# Patient Record
Sex: Male | Born: 1985 | Race: White | Hispanic: No | Marital: Married
Health system: Southern US, Academic
[De-identification: ages and names within clinical notes are randomized; demographics above are authoritative.]

## PROBLEM LIST (undated history)

## (undated) ENCOUNTER — Ambulatory Visit

## (undated) ENCOUNTER — Telehealth

## (undated) ENCOUNTER — Ambulatory Visit: Payer: PRIVATE HEALTH INSURANCE

## (undated) ENCOUNTER — Encounter: Attending: Nephrology | Primary: Nephrology

## (undated) ENCOUNTER — Encounter: Attending: Geriatric Medicine | Primary: Geriatric Medicine

## (undated) ENCOUNTER — Encounter

## (undated) ENCOUNTER — Encounter: Attending: Surgery | Primary: Surgery

## (undated) ENCOUNTER — Non-Acute Institutional Stay: Payer: PRIVATE HEALTH INSURANCE

## (undated) ENCOUNTER — Ambulatory Visit: Payer: PRIVATE HEALTH INSURANCE | Attending: Geriatric Medicine | Primary: Geriatric Medicine

## (undated) ENCOUNTER — Telehealth: Attending: Clinical | Primary: Clinical

## (undated) ENCOUNTER — Encounter
Attending: Student in an Organized Health Care Education/Training Program | Primary: Student in an Organized Health Care Education/Training Program

## (undated) ENCOUNTER — Encounter: Payer: PRIVATE HEALTH INSURANCE | Attending: Nephrology | Primary: Nephrology

## (undated) ENCOUNTER — Institutional Professional Consult (permissible substitution): Payer: PRIVATE HEALTH INSURANCE

## (undated) ENCOUNTER — Encounter: Attending: Hematology | Primary: Hematology

## (undated) ENCOUNTER — Telehealth: Attending: Nephrology | Primary: Nephrology

## (undated) ENCOUNTER — Ambulatory Visit
Payer: PRIVATE HEALTH INSURANCE | Attending: Rehabilitative and Restorative Service Providers" | Primary: Rehabilitative and Restorative Service Providers"

## (undated) ENCOUNTER — Ambulatory Visit
Payer: BLUE CROSS/BLUE SHIELD | Attending: Student in an Organized Health Care Education/Training Program | Primary: Student in an Organized Health Care Education/Training Program

## (undated) ENCOUNTER — Encounter: Attending: Physician Assistant | Primary: Physician Assistant

## (undated) ENCOUNTER — Ambulatory Visit: Payer: BLUE CROSS/BLUE SHIELD

## (undated) ENCOUNTER — Encounter: Payer: PRIVATE HEALTH INSURANCE | Attending: Surgery | Primary: Surgery

## (undated) ENCOUNTER — Ambulatory Visit
Attending: Student in an Organized Health Care Education/Training Program | Primary: Student in an Organized Health Care Education/Training Program

## (undated) ENCOUNTER — Encounter: Attending: Anesthesiology | Primary: Anesthesiology

## (undated) ENCOUNTER — Ambulatory Visit: Payer: PRIVATE HEALTH INSURANCE | Attending: Nephrology | Primary: Nephrology

## (undated) ENCOUNTER — Ambulatory Visit: Attending: Nephrology | Primary: Nephrology

## (undated) ENCOUNTER — Telehealth
Attending: Student in an Organized Health Care Education/Training Program | Primary: Student in an Organized Health Care Education/Training Program

## (undated) DIAGNOSIS — K649 Unspecified hemorrhoids: Secondary | ICD-10-CM

## (undated) DIAGNOSIS — R195 Other fecal abnormalities: Secondary | ICD-10-CM

## (undated) DIAGNOSIS — K5900 Constipation, unspecified: Secondary | ICD-10-CM

## (undated) DIAGNOSIS — N059 Unspecified nephritic syndrome with unspecified morphologic changes: Secondary | ICD-10-CM

## (undated) DIAGNOSIS — G4730 Sleep apnea, unspecified: Secondary | ICD-10-CM

## (undated) DIAGNOSIS — N159 Renal tubulo-interstitial disease, unspecified: Secondary | ICD-10-CM

## (undated) DIAGNOSIS — R197 Diarrhea, unspecified: Secondary | ICD-10-CM

## (undated) DIAGNOSIS — R112 Nausea with vomiting, unspecified: Secondary | ICD-10-CM

## (undated) DIAGNOSIS — R0602 Shortness of breath: Secondary | ICD-10-CM

## (undated) HISTORY — DX: Sleep apnea, unspecified: G47.30

## (undated) HISTORY — DX: Unspecified hemorrhoids: K64.9

## (undated) HISTORY — DX: Nausea with vomiting, unspecified: R11.2

## (undated) HISTORY — DX: Constipation, unspecified: K59.00

## (undated) HISTORY — DX: Shortness of breath: R06.02

## (undated) HISTORY — DX: Unspecified nephritic syndrome with unspecified morphologic changes: N05.9

## (undated) HISTORY — DX: Diarrhea, unspecified: R19.7

## (undated) HISTORY — DX: Other fecal abnormalities: R19.5

## (undated) HISTORY — PX: APPENDECTOMY: SHX54

## (undated) HISTORY — DX: Renal tubulo-interstitial disease, unspecified: N15.9

## (undated) MED ORDER — BUSPIRONE 30 MG TABLET
Freq: Two times a day (BID) | ORAL | 0 days
Start: ? — End: 2020-09-23

## (undated) MED ORDER — BUSPIRONE 15 MG TABLET: Freq: Two times a day (BID) | ORAL | 0.00000 days

---

## 2006-08-19 ENCOUNTER — Emergency Department: Payer: Self-pay | Admitting: Emergency Medicine

## 2012-01-17 ENCOUNTER — Inpatient Hospital Stay: Payer: Self-pay | Admitting: Surgery

## 2012-01-17 LAB — COMPREHENSIVE METABOLIC PANEL
Albumin: 4.7 g/dL (ref 3.4–5.0)
Anion Gap: 7 (ref 7–16)
Bilirubin,Total: 0.8 mg/dL (ref 0.2–1.0)
Calcium, Total: 9.6 mg/dL (ref 8.5–10.1)
Chloride: 100 mmol/L (ref 98–107)
Glucose: 98 mg/dL (ref 65–99)
Osmolality: 266 (ref 275–301)
Total Protein: 9.3 g/dL — ABNORMAL HIGH (ref 6.4–8.2)

## 2012-01-17 LAB — CBC
HCT: 46.7 % (ref 40.0–52.0)
MCH: 33.3 pg (ref 26.0–34.0)
MCV: 93 fL (ref 80–100)
RBC: 5.01 10*6/uL (ref 4.40–5.90)
RDW: 12.5 % (ref 11.5–14.5)
WBC: 15.5 10*3/uL — ABNORMAL HIGH (ref 3.8–10.6)

## 2012-01-17 LAB — URINALYSIS, COMPLETE
Bacteria: NONE SEEN
Blood: NEGATIVE
Glucose,UR: NEGATIVE mg/dL (ref 0–75)
Leukocyte Esterase: NEGATIVE
Nitrite: NEGATIVE
Ph: 5 (ref 4.5–8.0)
Protein: NEGATIVE
Specific Gravity: 1.011 (ref 1.003–1.030)
WBC UR: <1 /HPF (ref 0–5)

## 2012-01-17 LAB — LIPASE, BLOOD: Lipase: 123 U/L (ref 73–393)

## 2012-01-18 LAB — PATHOLOGY REPORT

## 2012-01-19 LAB — CBC WITH DIFFERENTIAL/PLATELET
Basophil #: 0 10*3/uL (ref 0.0–0.1)
Eosinophil #: 0.1 10*3/uL (ref 0.0–0.7)
Eosinophil %: 1.7 %
HGB: 12.1 g/dL — ABNORMAL LOW (ref 13.0–18.0)
Lymphocyte #: 2.1 10*3/uL (ref 1.0–3.6)
MCH: 32.4 pg (ref 26.0–34.0)
MCHC: 34.2 g/dL (ref 32.0–36.0)
MCV: 95 fL (ref 80–100)
Monocyte #: 0.9 x10 3/mm (ref 0.2–1.0)
Neutrophil %: 61.6 %
Platelet: 152 10*3/uL (ref 150–440)
RDW: 12.4 % (ref 11.5–14.5)
WBC: 8.2 10*3/uL (ref 3.8–10.6)

## 2014-09-21 NOTE — Op Note (Signed)
PATIENT NAME:  Jeffrey Holloway, Jeffrey Holloway MR#:  [id number] DATE OF BIRTH:  [birth year]  DATE OF PROCEDURE:  01/17/2012  PREOPERATIVE DIAGNOSIS: Acute appendicitis.   POSTOPERATIVE DIAGNOSIS: Acute appendicitis with perforation.   PROCEDURE: Laparoscopic appendectomy.   SURGEON: Meilah Delrosario A. Marina Gravel, M.D.   ASSISTANT: None.   ANESTHESIA: General endotracheal.   DESCRIPTION OF PROCEDURE: With the patient in the supine position, general endotracheal anesthesia was induced. His abdomen was clipped of hair, prepped and draped with ChloraPrep solution and time out was observed. The left arm was padded and tucked at his side. A 12 mm blunt Hassan trocar was placed through an open technique through an infraumbilical transversely oriented skin incision. Pneumoperitoneum was established. A 5 mm Bladeless trocar was placed in the right upper quadrant and a 5 mm Bladeless trocar was placed in the left lower quadrant. The appendix was identified in the pelvis and found to be perforated with pus seen within the pelvis, which was immediately aspirated. The appendix was elevated towards the anterior abdominal wall. A window was fashioned at the base of the appendix with blunt technique and a blue load of the endoscopic 35 mm stapler was used to transect the appendix at its base. The mesoappendix was then divided between two fires of the white load of the same stapler. The specimen was captured in an Endo Catch device and retrieved. The right lower quadrant and right upper quadrant was copiously irrigated with several liters of warm normal saline and aspirated dry and hemostasis appeared to be adequate on the operative field. Ports were then removed under direct visualization and the infraumbilical fascial defect was closed with a figure-of-eight vertically oriented #0 Vicryl suture in vertical orientation. A total of 30 mL of 0.25% plain Marcaine was infiltrated along all skin and fascial incisions prior to closure. Skin edges were  reapproximated utilizing 4-0 Vicryl subcuticular, benzoin, Steri-Strips, Telfa, and Tegaderm. The patient was then subsequently extubated and taken to the recovery room in stable and satisfactory condition by anesthesia services.  ____________________________ Jeannette How Marina Gravel, MD mab:slb D: 01/25/2012 12:31:00 ET T: 01/25/2012 12:41:27 ET JOB#: [id number]  cc: Elta Guadeloupe A. Marina Gravel, MD, <Dictator> Hortencia Conradi MD ELECTRONICALLY SIGNED 01/28/2012 9:55

## 2014-09-21 NOTE — H&P (Signed)
PATIENT NAME:  Jeffrey Holloway, VIARS MR#:  [id number] DATE OF BIRTH:  Aug 25, 1985  DATE OF ADMISSION:  01/17/2012  CHIEF COMPLAINT: Right lower quadrant pain.   HISTORY OF PRESENT ILLNESS: This is a patient with four days of abdominal pain that started in the periumbilical area and now is in the right lower quadrant. He thought he was constipated but had a bowel movement and his pain has not gotten better. He points to the right lower quadrant as his point of maximal tenderness. He's had minimal nausea. No emesis. No fevers or chills. No prior episode. No dysuria. No melena or hematochezia.   PAST MEDICAL HISTORY: None.   PAST SURGICAL HISTORY: None.   ALLERGIES: None.   MEDICATIONS: None.   FAMILY HISTORY: Noncontributory.   SOCIAL HISTORY: The patient works as a Mudlogger. He stopped smoking four months ago but was a heavy smoker. He is a fairly heavy drinker, drinking every day and occasionally heavier.   REVIEW OF SYSTEMS: 10 system review was performed and negative with the exception of that mentioned in the history of present illness.   PHYSICAL EXAMINATION:    GENERAL: Healthy but uncomfortable-appearing Caucasian male patient lying perfectly still in the bed in the ED.   VITAL SIGNS: Temperature 98.2, pulse 64, respirations 18, blood pressure 122/72, 92% room air sat.   HEENT: No scleral icterus.   NECK: No palpable neck nodes.   CHEST: Clear to auscultation.   CARDIAC: Regular rate and rhythm.   ABDOMEN: Abdomen is rigid with guarding, rebound, and percussion tenderness, maximal tenderness in the right lower quadrant with a positive Rovsing sign.   EXTREMITIES: Without edema. Calves are nontender.   NEUROLOGIC: Grossly intact.   INTEGUMENTARY: No jaundice.   LABORATORY, DIAGNOSTIC, AND RADIOLOGICAL DATA: White blood cell count is elevated at 15.5, hemoglobin and hematocrit 16 and 47, platelet count 220. Electrolytes are within normal limits.    ASSESSMENT AND PLAN: This is a patient with acute appendicitis. Rationale for offering laparoscopy was discussed with the patient. The risks of bleeding, infection, recurrence of symptoms, failure to resolve his symptoms, and conversion to an open procedure was discussed. Dr. Marina Gravel will be doing his surgery later on today. This was all discussed with he and his significant other. They understood and agreed to proceed.   ____________________________ Jerrol Banana Burt Knack, MD rec:drc D: 01/17/2012 06:33:03 ET T: 01/17/2012 07:58:52 ET JOB#: [id number]  cc: Jerrol Banana. Burt Knack, MD, <Dictator> Florene Glen MD ELECTRONICALLY SIGNED 01/17/2012 20:39

## 2014-09-21 NOTE — Discharge Summary (Signed)
PATIENT NAME:  Jeffrey Holloway, Jeffrey Holloway MR#:  [id number] DATE OF BIRTH:  06-15-1985  DATE OF ADMISSION:  01/17/2012 DATE OF DISCHARGE:  01/20/2012  FINAL DIAGNOSIS: Acute perforated appendicitis.   PRINCIPAL PROCEDURE: Laparoscopic appendectomy as well as CT scan of the abdomen and pelvis, intravenous antibiotics.  HOSPITAL COURSE SUMMARY: The patient was admitted following his appendectomy. Had a mild postoperative ileus which resolved by postoperative day #2. He was treated with intravenous antibiotics. Repeat white count was 8.2. He remained afebrile during his hospital stay. By postoperative day #3 the patient's abdomen was soft and nontender. He was tolerating a regular diet. Stable and improved for discharge on oral antibiotics for a total of seven days.  DISCHARGE MEDICATIONS: 1. Percocet 5/325, 1 to 2 tablets q.4-6 hours as needed for pain. 2. Cipro 500 mg by mouth b.i.d. for seven days. 3. Flagyl 500 mg by mouth t.i.d. for seven days.  DISCHARGE INSTRUCTIONS: Call with any questions or concerns to the office.   ____________________________ Jeannette How. Marina Gravel, MD mab:cms D: 01/25/2012 12:34:17 ET T: 01/25/2012 12:38:32 ET JOB#: [id number]  cc: Elta Guadeloupe A. Marina Gravel, MD, <Dictator> Hortencia Conradi MD ELECTRONICALLY SIGNED 01/28/2012 9:55

## 2014-09-21 NOTE — H&P (Signed)
Subjective/Chief Complaint rlq pain    History of Present Illness 4 days abd pain, rlq no n/v no f/c no prior episode was constipated, had bm, pain no better    Past History PMH none PSH none   Past Med/Surgical Hx:  negative:   ALLERGIES:  No Known Allergies:   Family and Social History:   Family History Non-Contributory    Social History positive  tobacco, positive ETOH, concrete work    + Tobacco Current (within 1 year)  stopped 4 mos ago    Place of Living Home   Review of Systems:   Fever/Chills No    Cough No    Abdominal Pain Yes    Diarrhea No    Constipation Yes    Nausea/Vomiting No    SOB/DOE No    Chest Pain No    Dysuria No    Tolerating Diet Yes   Physical Exam:   GEN uncomfortable, lies still    HEENT pink conjunctivae    NECK supple    RESP normal resp effort  clear BS  no use of accessory muscles    CARD regular rate    ABD positive tenderness  rigid  guarding, rebound, max rlq    EXTR negative edema    SKIN normal to palpation    PSYCH alert, A+O to time, place, person, good insight, anxious   Lab Results: Hepatic:  15-Aug-13 01:26    Bilirubin, Total 0.8   Alkaline Phosphatase 89   SGPT (ALT) 24   SGOT (AST) 21   Total Protein, Serum  9.3   Albumin, Serum 4.7  Routine Chem:  15-Aug-13 01:26    Glucose, Serum 98   BUN 7   Creatinine (comp) 0.94   Sodium, Serum  134   Potassium, Serum 3.7   Chloride, Serum 100   CO2, Serum 27   Calcium (Total), Serum 9.6   Osmolality (calc) 266   eGFR (African American) >60   eGFR (Non-African American) >60 (eGFR values <50m/min/1.73 m2 may be an indication of chronic kidney disease (CKD). Calculated eGFR is useful in patients with stable renal function. The eGFR calculation will not be reliable in acutely ill patients when serum creatinine is changing rapidly. It is not useful in  patients on dialysis. The eGFR calculation may not be applicable to patients at the low  and high extremes of body sizes, pregnant women, and vegetarians.)   Anion Gap 7   Lipase 123 (Result(s) reported on 17 Jan 2012 at 01:58AM.)  Routine UA:  15-Aug-13 01:26    Color (UA) Yellow   Clarity (UA) Clear   Glucose (UA) Negative   Bilirubin (UA) Negative   Ketones (UA) 1+   Specific Gravity (UA) 1.011   Blood (UA) Negative   pH (UA) 5.0   Protein (UA) Negative   Nitrite (UA) Negative   Leukocyte Esterase (UA) Negative (Result(s) reported on 17 Jan 2012 at 01:51AM.)   RBC (UA) <1 /HPF   WBC (UA) <1 /HPF   Bacteria (UA) NONE SEEN   Epithelial Cells (UA) NONE SEEN   Mucous (UA) PRESENT (Result(s) reported on 17 Jan 2012 at 01:51AM.)  Routine Hem:  15-Aug-13 01:26    WBC (CBC)  15.5   RBC (CBC) 5.01   Hemoglobin (CBC) 16.7   Hematocrit (CBC) 46.7   Platelet Count (CBC) 220 (Result(s) reported on 17 Jan 2012 at 01:52AM.)   MCV 93   MCH 33.3   MCHC 35.7   RDW  12.5     Assessment/Admission Diagnosis ac appy lap appy risks and options see dictation   Electronic Signatures: Florene Glen (MD)  (Signed 15-Aug-13 06:30)  Authored: CHIEF COMPLAINT and HISTORY, PAST MEDICAL/SURGIAL HISTORY, ALLERGIES, FAMILY AND SOCIAL HISTORY, REVIEW OF SYSTEMS, PHYSICAL EXAM, LABS, ASSESSMENT AND PLAN   Last Updated: 15-Aug-13 06:30 by Florene Glen (MD)

## 2015-01-10 ENCOUNTER — Emergency Department: Admission: EM | Admit: 2015-01-10 | Discharge: 2015-01-10 | Discharge status: Absent without leave | Payer: Self-pay

## 2016-01-23 ENCOUNTER — Emergency Department: Payer: BLUE CROSS/BLUE SHIELD

## 2016-01-23 ENCOUNTER — Emergency Department
Admission: EM | Admit: 2016-01-23 | Discharge: 2016-01-23 | Disposition: A | Discharge status: Home / Self Care | Payer: BLUE CROSS/BLUE SHIELD | Attending: Emergency Medicine | Admitting: Emergency Medicine

## 2016-01-23 ENCOUNTER — Encounter: Payer: Self-pay | Admitting: Emergency Medicine

## 2016-01-23 DIAGNOSIS — F17210 Nicotine dependence, cigarettes, uncomplicated: Secondary | ICD-10-CM | POA: Insufficient documentation

## 2016-01-23 DIAGNOSIS — Y929 Unspecified place or not applicable: Secondary | ICD-10-CM | POA: Insufficient documentation

## 2016-01-23 DIAGNOSIS — Y999 Unspecified external cause status: Secondary | ICD-10-CM | POA: Insufficient documentation

## 2016-01-23 DIAGNOSIS — X58XXXA Exposure to other specified factors, initial encounter: Secondary | ICD-10-CM | POA: Insufficient documentation

## 2016-01-23 DIAGNOSIS — T185XXA Foreign body in anus and rectum, initial encounter: Secondary | ICD-10-CM | POA: Diagnosis not present

## 2016-01-23 DIAGNOSIS — Y939 Activity, unspecified: Secondary | ICD-10-CM | POA: Diagnosis not present

## 2016-01-23 DIAGNOSIS — K625 Hemorrhage of anus and rectum: Secondary | ICD-10-CM | POA: Diagnosis present

## 2016-01-23 MED ORDER — OXYCODONE-ACETAMINOPHEN 5-325 MG PO TABS
1.0000 | ORAL_TABLET | Freq: Once | ORAL | Status: AC
  Administered 2016-01-23: 1 via ORAL
  Filled 2016-01-23: qty 1

## 2016-01-23 MED ORDER — ONDANSETRON 4 MG PO TBDP
ORAL_TABLET | ORAL | Status: AC
  Administered 2016-01-23: 4 mg via ORAL
  Filled 2016-01-23: qty 1

## 2016-01-23 MED ORDER — ONDANSETRON 4 MG PO TBDP
4.0000 mg | ORAL_TABLET | Freq: Once | ORAL | Status: AC
  Administered 2016-01-23: 4 mg via ORAL

## 2016-01-23 MED ORDER — LIDOCAINE HCL 2 % EX GEL
CUTANEOUS | Status: AC
  Administered 2016-01-23: 1
  Filled 2016-01-23: qty 10

## 2016-01-23 MED ORDER — LIDOCAINE HCL 2 % EX GEL
1.0000 "application " | Freq: Once | CUTANEOUS | Status: AC
  Administered 2016-01-23: 1

## 2016-01-23 MED ORDER — TRAMADOL HCL 50 MG PO TABS: 50.0000 mg | ORAL_TABLET | Freq: Four times a day (QID) | ORAL | 0 refills | Status: DC | PRN

## 2016-01-23 NOTE — ED Notes (Signed)
Dr. Azalee Course at bedside.

## 2016-01-23 NOTE — ED Notes (Signed)
Pt sitting on toilet, states "this is the only way I feel comfortable".  Pt visibly appears uncomfortable.  EDP has contacted surgeon on call for patient at this time.

## 2016-01-23 NOTE — H&P (Signed)
Jeffrey Holloway is an 30 y.o. male.   Chief Complaint: rectal foreign body HPI: 30 year old male who comes in today with a complaint of rectal foreign body after doing some sexual experimentation. Patient states that he lost the rectal foreign body approximately an hour prior. The patient states that he was having cramping and a lot of abdominal pain. The patient has been trying to force it to come out at home. Patient states that his been having some blood per the rectum. Prior to this the patient was having bowel movements without any difficulty and has never had any blood per rectum before.    PMHx: Appendicitis in 2013, otherwise healthy  Past Surgical History:  Procedure Laterality Date  . APPENDECTOMY      Family History: HTN, Heart disease  Social History:  reports that he has been smoking Cigarettes.  He has been smoking about 1.00 pack per day. He has never used smokeless tobacco. He reports that he drinks alcohol. He reports that he uses drugs, including Marijuana and Cocaine.  Allergies: No Known Allergies   (Not in a hospital admission)  No results found for this or any previous visit (from the past 48 hour(s)). Dg Abdomen 1 View  Result Date: 01/23/2016 CLINICAL DATA:  Rectal foreign body. EXAM: ABDOMEN - 1 VIEW COMPARISON:  None. FINDINGS: No dilated bowel loops to suggest obstruction. No abnormal soft tissue calcifications. Osseous structures are intact. No radiopaque foreign body. No evidence of free air on supine view. IMPRESSION: No radiopaque foreign bodies are seen radiographically. Electronically Signed   By: Jeb Levering M.D.   On: 01/23/2016 05:09    Review of Systems  Constitutional: Negative for chills, fever and malaise/fatigue.  HENT: Negative for congestion and sore throat.   Respiratory: Negative for cough, sputum production, shortness of breath and wheezing.   Cardiovascular: Negative for chest pain, palpitations, leg swelling and PND.   Gastrointestinal: Positive for abdominal pain, blood in stool, constipation and nausea.  Genitourinary: Negative for dysuria, flank pain and hematuria.  Musculoskeletal: Negative for back pain, falls and neck pain.  Skin: Negative for itching and rash.  Neurological: Negative for dizziness, focal weakness, loss of consciousness and weakness.  Psychiatric/Behavioral: The patient is not nervous/anxious.   All other systems reviewed and are negative.   Blood pressure (!) 152/97, pulse (!) 102, resp. rate (!) 22, height 6' (1.829 m), weight 194 lb (88 kg), SpO2 95 %. Physical Exam  Vitals reviewed. Constitutional: He is oriented to person, place, and time. He appears well-developed and well-nourished. No distress.  HENT:  Head: Normocephalic and atraumatic.  Right Ear: External ear normal.  Left Ear: External ear normal.  Nose: Nose normal.  Mouth/Throat: Oropharynx is clear and moist. No oropharyngeal exudate.  Eyes: Conjunctivae and EOM are normal. Pupils are equal, round, and reactive to light. No scleral icterus.  Neck: Normal range of motion. Neck supple. No tracheal deviation present.  Cardiovascular: Normal rate, regular rhythm, normal heart sounds and intact distal pulses.  Exam reveals no gallop and no friction rub.   No murmur heard. Respiratory: Effort normal and breath sounds normal. No respiratory distress. He has no wheezes. He has no rales.  GI: Soft. Bowel sounds are normal. He exhibits no distension. There is no tenderness. There is no rebound.  Genitourinary:  Genitourinary Comments: Some blood per rectum, foreign body in approximately 4 cm from anal verge,  silicone material and unable to grab with hand, able to have with the sponge stick  and remove the foreign body  Musculoskeletal: Normal range of motion. He exhibits no edema, tenderness or deformity.  Neurological: He is alert and oriented to person, place, and time. No cranial nerve deficit.  Skin: Skin is warm and  dry. No rash noted. No erythema. No pallor.  Psychiatric: He has a normal mood and affect. His behavior is normal. Judgment and thought content normal.     Assessment/Plan 30 year old male with rectal foreign body otherwise healthy.  I was able to remove the foreign body by using a sponge stick clamp to grab the silicone structure and remove it from the rectum. There was some bleeding around the area from the trauma of the foreign body and removal. He will have some rectal bleeding for the next couple of days but this would gradually improve. The patient is currently having some crampy abdominal pain but these should improve as well in the next few hours. No follow-up needed unless there are continued issues. Instructed the patient not to place foreign objects in his rectum for at least 2 weeks to give this area time to repair and also recommended not placing objects in the rectum that could not easily be retrieved and taking portion outside the rectum at all times.  Hubbard Robinson, MD 01/23/2016, 6:46 AM

## 2016-01-23 NOTE — ED Provider Notes (Signed)
[facility] Emergency Department Provider Note   ____________________________________________   First MD Initiated Contact with Patient 01/23/16 [phone]     (approximate)  I have reviewed the triage vital signs and the nursing notes.   HISTORY  Chief Complaint Foreign Body in Rectum    HPI Jeffrey Holloway is a 30 y.o. male who comes into the hospital today with a rectal foreign body. The patient reports that he was experimenting with something that his friend suggested and now he is here. The patient reports that he placed a butt plug in his rectum and he is unable to get it out. He reports that he is bleeding from trying to pull it out. The patient has never had this happen before. He reports that he has some rectal pain and some abdominal pain which she rates a 9 out of 10 in intensity. The patient reports that he just wants it out. He is here for evaluation.   History reviewed. No pertinent past medical history.  There are no active problems to display for this patient.   Past Surgical History:  Procedure Laterality Date  . APPENDECTOMY      Prior to Admission medications   Medication Sig Start Date End Date Taking? Authorizing Provider  traMADol (ULTRAM) 50 MG tablet Take 1 tablet (50 mg total) by mouth every 6 (six) hours as needed. 01/23/16   Loney Hering, MD    Allergies Review of patient's allergies indicates no known allergies.  No family history on file.  Social History Social History  Substance Use Topics  . Smoking status: Current Every Day Smoker    Packs/day: 1.00    Types: Cigarettes  . Smokeless tobacco: Never Used  . Alcohol use Yes    Review of Systems Constitutional: No fever/chills Eyes: No visual changes. ENT: No sore throat. Cardiovascular: Denies chest pain. Respiratory: Denies shortness of breath. Gastrointestinal:  abdominal pain and rectal pain  No nausea, no vomiting.  No diarrhea.  No  constipation. Genitourinary: Negative for dysuria. Musculoskeletal: Negative for back pain. Skin: Negative for rash. Neurological: Negative for headaches, focal weakness or numbness.  10-point ROS otherwise negative.  ____________________________________________   PHYSICAL EXAM:  VITAL SIGNS: ED Triage Vitals  Enc Vitals Group     BP 01/23/16 0353 (!) 152/97     Pulse Rate 01/23/16 0353 (!) 102     Resp 01/23/16 0353 (!) 22     Temp --      Temp src --      SpO2 01/23/16 0353 95 %     Weight 01/23/16 0354 194 lb (88 kg)     Height 01/23/16 0354 6' (1.829 m)     Head Circumference --      Peak Flow --      Pain Score 01/23/16 0354 9     Pain Loc --      Pain Edu? --      Excl. in [place]? --     Constitutional: Alert and oriented. Well appearing and in Moderate distress. Eyes: Conjunctivae are normal. PERRL. EOMI. Head: Atraumatic. Nose: No congestion/rhinnorhea. Mouth/Throat: Mucous membranes are moist.  Oropharynx non-erythematous. Cardiovascular: Normal rate, regular rhythm. Grossly normal heart sounds.  Good peripheral circulation. Respiratory: Normal respiratory effort.  No retractions. Lungs CTAB. Gastrointestinal: Soft and nontender. No distention. Positive bowel sounds Rectal: Palpable rectal foreign body. Musculoskeletal: No lower extremity tenderness nor edema.   Neurologic:  Normal speech and language.  Skin:  Skin is warm,  dry and intact. Marland Kitchen Psychiatric: Mood and affect are normal.   ____________________________________________   LABS (all labs ordered are listed, but only abnormal results are displayed)  Labs Reviewed - No data to display ____________________________________________  EKG  None ____________________________________________  RADIOLOGY  KUB ____________________________________________   PROCEDURES  Procedure(s) performed: None  Procedures  Critical Care performed: No  ____________________________________________   INITIAL  IMPRESSION / ASSESSMENT AND PLAN / ED COURSE  Pertinent labs & imaging results that were available during my care of the patient were reviewed by me and considered in my medical decision making (see chart for details).  This is a 30 year old male who comes into the hospital today with a rectal foreign body. I did attempt to use some surgical lube and then a lido jet to remove the foreign body. I am able to palpate the foreign body but I cannot remove it. I will perform a KUB and I will contact surgery to see if they can assist with removing this foreign body.  Clinical Course  Value Comment By Time  DG Abdomen 1 View No radiopaque foreign bodies are seen radiographically. Loney Hering, MD 08/21 [phone]    The foreign body was removed by surgery. He will be discharged to home.    ____________________________________________   FINAL CLINICAL IMPRESSION(S) / ED DIAGNOSES  Final diagnoses:  Rectal foreign body, initial encounter      NEW MEDICATIONS STARTED DURING THIS VISIT:  New Prescriptions   TRAMADOL (ULTRAM) 50 MG TABLET    Take 1 tablet (50 mg total) by mouth every 6 (six) hours as needed.     Note:  This document was prepared using Dragon voice recognition software and may include unintentional dictation errors.    Loney Hering, MD 01/23/16 [phone]

## 2016-01-23 NOTE — ED Triage Notes (Signed)
Patient reports he was experimenting sexually and has a foreign body in his rectum.  Patient reports object in rectum for approximately an hour and reports rectal bleeding.

## 2017-10-10 ENCOUNTER — Emergency Department: Payer: BLUE CROSS/BLUE SHIELD

## 2017-10-10 ENCOUNTER — Encounter: Payer: Self-pay | Admitting: Emergency Medicine

## 2017-10-10 ENCOUNTER — Inpatient Hospital Stay: Payer: BLUE CROSS/BLUE SHIELD

## 2017-10-10 ENCOUNTER — Other Ambulatory Visit: Payer: Self-pay

## 2017-10-10 ENCOUNTER — Inpatient Hospital Stay
Admission: EM | Admit: 2017-10-10 | Discharge: 2017-10-18 | DRG: 673 | Disposition: A | Discharge status: Home / Self Care | Payer: BLUE CROSS/BLUE SHIELD | Attending: Internal Medicine | Admitting: Internal Medicine

## 2017-10-10 DIAGNOSIS — J811 Chronic pulmonary edema: Secondary | ICD-10-CM | POA: Diagnosis present

## 2017-10-10 DIAGNOSIS — J9601 Acute respiratory failure with hypoxia: Secondary | ICD-10-CM | POA: Diagnosis present

## 2017-10-10 DIAGNOSIS — R739 Hyperglycemia, unspecified: Secondary | ICD-10-CM | POA: Diagnosis present

## 2017-10-10 DIAGNOSIS — K5900 Constipation, unspecified: Secondary | ICD-10-CM | POA: Diagnosis not present

## 2017-10-10 DIAGNOSIS — N179 Acute kidney failure, unspecified: Principal | ICD-10-CM | POA: Diagnosis present

## 2017-10-10 DIAGNOSIS — F17210 Nicotine dependence, cigarettes, uncomplicated: Secondary | ICD-10-CM | POA: Diagnosis present

## 2017-10-10 DIAGNOSIS — I248 Other forms of acute ischemic heart disease: Secondary | ICD-10-CM | POA: Diagnosis present

## 2017-10-10 DIAGNOSIS — F1410 Cocaine abuse, uncomplicated: Secondary | ICD-10-CM | POA: Diagnosis present

## 2017-10-10 DIAGNOSIS — Z791 Long term (current) use of non-steroidal anti-inflammatories (NSAID): Secondary | ICD-10-CM

## 2017-10-10 DIAGNOSIS — R319 Hematuria, unspecified: Secondary | ICD-10-CM | POA: Diagnosis present

## 2017-10-10 DIAGNOSIS — K219 Gastro-esophageal reflux disease without esophagitis: Secondary | ICD-10-CM | POA: Diagnosis present

## 2017-10-10 DIAGNOSIS — E8770 Fluid overload, unspecified: Secondary | ICD-10-CM | POA: Diagnosis present

## 2017-10-10 DIAGNOSIS — I161 Hypertensive emergency: Secondary | ICD-10-CM | POA: Diagnosis present

## 2017-10-10 DIAGNOSIS — Z79899 Other long term (current) drug therapy: Secondary | ICD-10-CM | POA: Diagnosis not present

## 2017-10-10 DIAGNOSIS — E872 Acidosis: Secondary | ICD-10-CM | POA: Diagnosis present

## 2017-10-10 DIAGNOSIS — G43909 Migraine, unspecified, not intractable, without status migrainosus: Secondary | ICD-10-CM | POA: Diagnosis not present

## 2017-10-10 DIAGNOSIS — I10 Essential (primary) hypertension: Secondary | ICD-10-CM | POA: Diagnosis present

## 2017-10-10 DIAGNOSIS — I7789 Other specified disorders of arteries and arterioles: Secondary | ICD-10-CM | POA: Diagnosis not present

## 2017-10-10 DIAGNOSIS — N171 Acute kidney failure with acute cortical necrosis: Secondary | ICD-10-CM

## 2017-10-10 DIAGNOSIS — Z452 Encounter for adjustment and management of vascular access device: Secondary | ICD-10-CM

## 2017-10-10 DIAGNOSIS — D631 Anemia in chronic kidney disease: Secondary | ICD-10-CM | POA: Diagnosis present

## 2017-10-10 DIAGNOSIS — T380X5A Adverse effect of glucocorticoids and synthetic analogues, initial encounter: Secondary | ICD-10-CM | POA: Diagnosis not present

## 2017-10-10 DIAGNOSIS — N057 Unspecified nephritic syndrome with diffuse crescentic glomerulonephritis: Secondary | ICD-10-CM | POA: Diagnosis not present

## 2017-10-10 DIAGNOSIS — K2900 Acute gastritis without bleeding: Secondary | ICD-10-CM | POA: Diagnosis not present

## 2017-10-10 DIAGNOSIS — R079 Chest pain, unspecified: Secondary | ICD-10-CM | POA: Diagnosis not present

## 2017-10-10 DIAGNOSIS — N2581 Secondary hyperparathyroidism of renal origin: Secondary | ICD-10-CM | POA: Diagnosis present

## 2017-10-10 DIAGNOSIS — Z992 Dependence on renal dialysis: Secondary | ICD-10-CM | POA: Diagnosis not present

## 2017-10-10 DIAGNOSIS — R0602 Shortness of breath: Secondary | ICD-10-CM | POA: Diagnosis not present

## 2017-10-10 DIAGNOSIS — G4730 Sleep apnea, unspecified: Secondary | ICD-10-CM | POA: Diagnosis present

## 2017-10-10 DIAGNOSIS — R809 Proteinuria, unspecified: Secondary | ICD-10-CM

## 2017-10-10 DIAGNOSIS — N189 Chronic kidney disease, unspecified: Secondary | ICD-10-CM | POA: Diagnosis not present

## 2017-10-10 LAB — BASIC METABOLIC PANEL
ANION GAP: 12 (ref 5–15)
BUN: 83 mg/dL — ABNORMAL HIGH (ref 6–20)
CO2: 19 mmol/L — ABNORMAL LOW (ref 22–32)
Calcium: 6.9 mg/dL — ABNORMAL LOW (ref 8.9–10.3)
Chloride: 109 mmol/L (ref 101–111)
Creatinine, Ser: 13.25 mg/dL — ABNORMAL HIGH (ref 0.61–1.24)
GFR calc Af Amer: 5 mL/min — ABNORMAL LOW (ref >60)
GFR, EST NON AFRICAN AMERICAN: 4 mL/min — AB (ref >60)
GLUCOSE: 102 mg/dL — AB (ref 65–99)
POTASSIUM: 3.8 mmol/L (ref 3.5–5.1)
Sodium: 140 mmol/L (ref 135–145)

## 2017-10-10 LAB — URINALYSIS, COMPLETE (UACMP) WITH MICROSCOPIC
BILIRUBIN URINE: NEGATIVE
Bacteria, UA: NONE SEEN
GLUCOSE, UA: 50 mg/dL — AB
KETONES UR: 5 mg/dL — AB
LEUKOCYTES UA: NEGATIVE
Nitrite: NEGATIVE
Specific Gravity, Urine: 1.011 (ref 1.005–1.030)
pH: 5 (ref 5.0–8.0)

## 2017-10-10 LAB — CBC
HEMATOCRIT: 28.3 % — AB (ref 40.0–52.0)
HEMOGLOBIN: 10 g/dL — AB (ref 13.0–18.0)
MCH: 33.6 pg (ref 26.0–34.0)
MCHC: 35.2 g/dL (ref 32.0–36.0)
MCV: 95.4 fL (ref 80.0–100.0)
Platelets: 198 10*3/uL (ref 150–440)
RBC: 2.97 MIL/uL — ABNORMAL LOW (ref 4.40–5.90)
RDW: 13.9 % (ref 11.5–14.5)
WBC: 7.8 10*3/uL (ref 3.8–10.6)

## 2017-10-10 LAB — TROPONIN I: Troponin I: 0.12 ng/mL (ref <0.03)

## 2017-10-10 LAB — MRSA PCR SCREENING: MRSA BY PCR: NEGATIVE

## 2017-10-10 LAB — PHOSPHORUS: Phosphorus: 8.6 mg/dL — ABNORMAL HIGH (ref 2.5–4.6)

## 2017-10-10 LAB — CK: CK TOTAL: 672 U/L — AB (ref 49–397)

## 2017-10-10 MED ORDER — HYDROMORPHONE HCL 1 MG/ML IJ SOLN
0.5000 mg | INTRAMUSCULAR | Status: DC | PRN
  Administered 2017-10-10 (×2): 0.5 mg via INTRAVENOUS

## 2017-10-10 MED ORDER — HYDROMORPHONE HCL 1 MG/ML IJ SOLN
1.0000 mg | INTRAMUSCULAR | Status: DC | PRN
  Administered 2017-10-10 – 2017-10-11 (×4): 1 mg via INTRAVENOUS
  Filled 2017-10-10 (×6): qty 1

## 2017-10-10 MED ORDER — HYDRALAZINE HCL 20 MG/ML IJ SOLN
INTRAMUSCULAR | Status: AC
  Administered 2017-10-10: 10 mg via INTRAVENOUS
  Filled 2017-10-10: qty 1

## 2017-10-10 MED ORDER — LIDOCAINE-PRILOCAINE 2.5-2.5 % EX CREA
1.0000 "application " | TOPICAL_CREAM | CUTANEOUS | Status: DC | PRN
  Filled 2017-10-10: qty 5

## 2017-10-10 MED ORDER — SODIUM CHLORIDE 0.9 % IV SOLN: 100.0000 mL | INTRAVENOUS | Status: DC | PRN

## 2017-10-10 MED ORDER — MORPHINE SULFATE (PF) 2 MG/ML IV SOLN
2.0000 mg | Freq: Once | INTRAVENOUS | Status: AC
  Administered 2017-10-10: 2 mg via INTRAVENOUS

## 2017-10-10 MED ORDER — ACETAMINOPHEN 650 MG RE SUPP: 650.0000 mg | Freq: Four times a day (QID) | RECTAL | Status: DC | PRN

## 2017-10-10 MED ORDER — PENTAFLUOROPROP-TETRAFLUOROETH EX AERO
1.0000 "application " | INHALATION_SPRAY | CUTANEOUS | Status: DC | PRN
  Filled 2017-10-10: qty 30

## 2017-10-10 MED ORDER — MORPHINE SULFATE (PF) 4 MG/ML IV SOLN
4.0000 mg | Freq: Once | INTRAVENOUS | Status: AC
  Administered 2017-10-10: 4 mg via INTRAVENOUS

## 2017-10-10 MED ORDER — PROMETHAZINE HCL 25 MG/ML IJ SOLN
INTRAMUSCULAR | Status: AC
  Administered 2017-10-10: 25 mg via INTRAVENOUS
  Filled 2017-10-10: qty 1

## 2017-10-10 MED ORDER — BISACODYL 5 MG PO TBEC
5.0000 mg | DELAYED_RELEASE_TABLET | Freq: Every day | ORAL | Status: DC | PRN
  Administered 2017-10-16: 23:00:00 5 mg via ORAL
  Filled 2017-10-10: qty 1

## 2017-10-10 MED ORDER — HYDROMORPHONE HCL 1 MG/ML IJ SOLN
0.5000 mg | INTRAMUSCULAR | Status: AC
  Administered 2017-10-10: 0.5 mg via INTRAVENOUS

## 2017-10-10 MED ORDER — HYDRALAZINE HCL 20 MG/ML IJ SOLN
10.0000 mg | Freq: Once | INTRAMUSCULAR | Status: AC
  Administered 2017-10-10: 10 mg via INTRAVENOUS

## 2017-10-10 MED ORDER — NICARDIPINE HCL IN NACL 20-0.86 MG/200ML-% IV SOLN
2.5000 mg/h | INTRAVENOUS | Status: DC
  Administered 2017-10-10 (×2): 2.5 mg/h via INTRAVENOUS
  Filled 2017-10-10 (×3): qty 200

## 2017-10-10 MED ORDER — HEPARIN SODIUM (PORCINE) 5000 UNIT/ML IJ SOLN
5000.0000 [IU] | Freq: Three times a day (TID) | INTRAMUSCULAR | Status: DC
  Administered 2017-10-10 – 2017-10-18 (×21): 5000 [IU] via SUBCUTANEOUS
  Filled 2017-10-10 (×21): qty 1

## 2017-10-10 MED ORDER — NITROGLYCERIN 2 % TD OINT
1.0000 [in_us] | TOPICAL_OINTMENT | Freq: Once | TRANSDERMAL | Status: AC
Start: 2017-10-10 — End: 2017-10-10
  Administered 2017-10-10: 1 [in_us] via TOPICAL

## 2017-10-10 MED ORDER — MORPHINE SULFATE (PF) 2 MG/ML IV SOLN
INTRAVENOUS | Status: AC
  Administered 2017-10-10: 2 mg via INTRAVENOUS
  Filled 2017-10-10: qty 1

## 2017-10-10 MED ORDER — PROMETHAZINE HCL 25 MG/ML IJ SOLN
25.0000 mg | INTRAMUSCULAR | Status: AC
  Administered 2017-10-10: 25 mg via INTRAVENOUS

## 2017-10-10 MED ORDER — ALTEPLASE 2 MG IJ SOLR
2.0000 mg | Freq: Once | INTRAMUSCULAR | Status: DC | PRN
  Filled 2017-10-10: qty 2

## 2017-10-10 MED ORDER — HYDROMORPHONE HCL 1 MG/ML IJ SOLN: 0.5000 mg | INTRAMUSCULAR | Status: AC

## 2017-10-10 MED ORDER — HYDROMORPHONE HCL 1 MG/ML IJ SOLN
INTRAMUSCULAR | Status: AC
  Administered 2017-10-10: 0.5 mg via INTRAVENOUS
  Filled 2017-10-10: qty 1

## 2017-10-10 MED ORDER — ONDANSETRON HCL 4 MG PO TABS: 4.0000 mg | ORAL_TABLET | Freq: Four times a day (QID) | ORAL | Status: DC | PRN

## 2017-10-10 MED ORDER — ONDANSETRON HCL 4 MG/2ML IJ SOLN
4.0000 mg | Freq: Four times a day (QID) | INTRAMUSCULAR | Status: DC | PRN
  Administered 2017-10-10 – 2017-10-15 (×5): 4 mg via INTRAVENOUS
  Filled 2017-10-10 (×6): qty 2

## 2017-10-10 MED ORDER — NITROGLYCERIN 2 % TD OINT
TOPICAL_OINTMENT | TRANSDERMAL | Status: AC
  Administered 2017-10-10: 1 [in_us] via TOPICAL
  Filled 2017-10-10: qty 1

## 2017-10-10 MED ORDER — ONDANSETRON HCL 4 MG/2ML IJ SOLN
INTRAMUSCULAR | Status: AC
  Administered 2017-10-10: 4 mg via INTRAVENOUS
  Filled 2017-10-10: qty 2

## 2017-10-10 MED ORDER — SENNOSIDES-DOCUSATE SODIUM 8.6-50 MG PO TABS: 1.0000 | ORAL_TABLET | Freq: Every evening | ORAL | Status: DC | PRN

## 2017-10-10 MED ORDER — MORPHINE SULFATE (PF) 4 MG/ML IV SOLN
INTRAVENOUS | Status: AC
  Administered 2017-10-10: 4 mg via INTRAVENOUS
  Filled 2017-10-10: qty 1

## 2017-10-10 MED ORDER — SODIUM CHLORIDE 0.9 % IV BOLUS
1000.0000 mL | Freq: Once | INTRAVENOUS | Status: AC
  Administered 2017-10-10: 1000 mL via INTRAVENOUS

## 2017-10-10 MED ORDER — LIDOCAINE HCL (PF) 1 % IJ SOLN
5.0000 mL | INTRAMUSCULAR | Status: DC | PRN
  Filled 2017-10-10: qty 5

## 2017-10-10 MED ORDER — HEPARIN SODIUM (PORCINE) 1000 UNIT/ML DIALYSIS
1000.0000 [IU] | INTRAMUSCULAR | Status: DC | PRN
  Filled 2017-10-10 (×2): qty 1

## 2017-10-10 MED ORDER — ONDANSETRON HCL 4 MG/2ML IJ SOLN
4.0000 mg | Freq: Once | INTRAMUSCULAR | Status: AC
  Administered 2017-10-10: 4 mg via INTRAVENOUS

## 2017-10-10 MED ORDER — SODIUM CHLORIDE 0.9 % IV BOLUS: 1000.0000 mL | Freq: Once | INTRAVENOUS | Status: DC

## 2017-10-10 MED ORDER — ACETAMINOPHEN 325 MG PO TABS
650.0000 mg | ORAL_TABLET | Freq: Four times a day (QID) | ORAL | Status: DC | PRN
  Administered 2017-10-11 – 2017-10-12 (×2): 650 mg via ORAL
  Filled 2017-10-10 (×2): qty 2

## 2017-10-10 MED ORDER — TUBERCULIN PPD 5 UNIT/0.1ML ID SOLN
5.0000 [IU] | Freq: Once | INTRADERMAL | Status: AC
  Administered 2017-10-12: 5 [IU] via INTRADERMAL
  Filled 2017-10-10 (×4): qty 0.1

## 2017-10-10 NOTE — ED Notes (Signed)
Pt placed on 2L via nasal canula per verbal orders given by MD Paduchowski after pt O2 sat dropped to 88% RA

## 2017-10-10 NOTE — Progress Notes (Signed)
HD Tx started    10/10/17 1645  Vital Signs  Pulse Rate 70  Resp 15  BP (!) 189/129  BP Location Right Arm  BP Method Automatic  Patient Position (if appropriate) Lying  Oxygen Therapy  SpO2 92 %  During Hemodialysis Assessment  Blood Flow Rate (mL/min) 150 mL/min  Arterial Pressure (mmHg) -50 mmHg  Venous Pressure (mmHg) 50 mmHg  Transmembrane Pressure (mmHg) 40 mmHg  Ultrafiltration Rate (mL/min) 250 mL/min  Dialysate Flow Rate (mL/min) 300 ml/min  Conductivity: Machine  13.5  HD Safety Checks Performed Yes  Dialysis Fluid Bolus Normal Saline  Bolus Amount (mL) 250 mL  Intra-Hemodialysis Comments Tx initiated (no complaint pt resting stable. )  Hemodialysis Catheter Left Internal jugular  Placement Date/Time: 10/10/17 1236   Placed prior to admission: No  Orientation: Left  Access Location: Internal jugular  Site Condition No complications  Blue Lumen Status Flushed  Red Lumen Status Flushed  Purple Lumen Status Capped (Central line)  Dressing Type Biopatch;Occlusive  Dressing Status Clean;Dry;Intact

## 2017-10-10 NOTE — Progress Notes (Signed)
RN notified MD that patient's bp is still high systlic still in 967S to 200s.  Dr Holley Raring gave order to start nicardipine gtt, start at 2.5mg /hr and titrate for goal systolic between 897-915.

## 2017-10-10 NOTE — Procedures (Signed)
Central Venous Dailysis Catheter Placement: TRIPLE LUMEN TRIALYSIS Indication: Hemo Dialysis/CRRT   Consent:emergent   Hand washing performed prior to starting the procedure.   Procedure: An active timeout was performed and correct patient, name, & ID confirmed. Patient was positioned correctly for central venous access. Patient was prepped using strict sterile technique including chlorohexadine preps, sterile drape, sterile gown and sterile gloves.  The area was prepped, draped and anesthetized in the usual sterile manner. Patient comfort was obtained.    A Double lumen catheter was placed in LEFT IJ  Vein There was good blood return, catheter caps were placed on lumens, catheter flushed easily, the line was secured and a sterile dressing and BIO-PATCH applied.   Ultrasound was used to visualize vasculature and guidance of needle.   Number of Attempts: 1 Complications:none  Estimated Blood Loss: none Operator: Cardin Nitschke.   Corrin Parker, M.D.  [facility]  Medical Director [place] Director [facility] Cardio-Pulmonary Department

## 2017-10-10 NOTE — ED Notes (Signed)
Pt sleeping at this time. Family at bedside. Remains on bipap at this time.

## 2017-10-10 NOTE — Progress Notes (Signed)
Post HD assessment, no change in LOC from initial assessment, tx tolerated well. Pt stable.    10/10/17 1830  Neurological  Level of Consciousness Alert  Orientation Level Oriented X4  Respiratory  Respiratory Pattern Labored  Chest Assessment Chest expansion symmetrical  Bilateral Breath Sounds Diminished  Cough None  Cardiac  Pulse Regular  Heart Sounds S1, S2  ECG Monitor Yes  Vascular  R Radial Pulse +2  L Radial Pulse +2  Edema Generalized  Psychosocial  Psychosocial (WDL) WDL

## 2017-10-10 NOTE — ED Notes (Signed)
Pt placed on 4L via nasal canula per verbal order by MD Paduchowski d/t pt O2 sat dropping to 89% on 2L

## 2017-10-10 NOTE — Progress Notes (Signed)
Pre HD Tx, 1st HD tx.   10/10/17 1632  Hand-Off documentation  Report given to (Full Name) Beatris Ship, RN   Report received from (Full Name) Tram, RN   Vital Signs  Temp 97.8 F (36.6 C)  Temp Source Oral  Pulse Rate 96  Pulse Rate Source Monitor  Resp 17  BP (!) 210/136  BP Location Right Arm  BP Method Automatic  Patient Position (if appropriate) Lying  Oxygen Therapy  SpO2 95 %  O2 Device Nasal Cannula  Pulse Oximetry Type Continuous  Pain Assessment  Pain Scale 0-10  Pain Score 8  Pain Type Acute pain  Pain Location Head  Pain Orientation Other (Comment) (orbital)  Pain Intervention(s) RN made aware  Dialysis Weight  Weight 78.1 kg (172 lb 2.9 oz)  Type of Weight Pre-Dialysis  Time-Out for Hemodialysis  What Procedure? HD  Pt Identifiers(min of two) First/Last Name;MRN/Account#  Correct Site? Yes  Correct Side? Yes  Correct Procedure? Yes  Consents Verified? Yes  Rad Studies Available? N/A  Safety Precautions Reviewed? Yes  Engineer, civil (consulting) Number [phone]  Station Number  (Bedside - ICU )  UF/Alarm Test Passed  Conductivity: Meter 14  Conductivity: Machine  14  pH 7.2  Reverse Osmosis Portable  [phone])  Normal Saline Lot Number [id number]  Dialyzer Lot Number [id number]  Disposable Set Lot Number [id number]  Machine Temperature 98.6 F (37 C)  Musician and Audible Yes  Blood Lines Intact and Secured Yes  Pre Treatment Patient Checks  Vascular access used during treatment Catheter  Hepatitis B Surface Antigen Results  (unknown)  Isolation Initiated Yes  Hemodialysis Consent Verified Yes  Hemodialysis Standing Orders Initiated Yes  ECG (Telemetry) Monitor On Yes  Prime Ordered Normal Saline  Length of  DialysisTreatment -hour(s) 1.5 Hour(s)  Dialysis Treatment Comments Na 140 (Tx time 1.5hrs at 150 BFR)  Dialyzer Elisio 17H NR  Dialysate 3K, 2.5 Ca  Dialysis Anticoagulant None  Dialysate Flow Ordered 300  Blood Flow Rate Ordered 150  mL/min  Ultrafiltration Goal 50 Liters  Dialysis Blood Pressure Support Ordered Normal Saline  Education / Care Plan  Dialysis Education Provided Yes  Documented Education in Care Plan Yes

## 2017-10-10 NOTE — ED Provider Notes (Addendum)
[facility] Emergency Department Provider Note  Time seen: 5:09 AM  I have reviewed the triage vital signs and the nursing notes.   HISTORY  Chief Complaint Chest Pain    HPI Jeffrey Holloway is a 32 y.o. male with no past medical history who presents to the emergency department with complaints of chest pain, body aches, nausea, vomiting.  According to the patient for the past for 5 months he has been experiencing intermittent chest pains, nausea, episodes of vomiting, generalized fatigue weakness body aches.  Patient states the chest pain worsened today so he came to the emergency department for evaluation.  Upon arrival patient is actively vomiting.  Patient describes chest pain as mild, body aches as moderate.  States shortness of breath worse with exertion.  History reviewed. No pertinent past medical history.  Patient Active Problem List   Diagnosis Date Noted  . Rectal foreign body     Past Surgical History:  Procedure Laterality Date  . APPENDECTOMY      Prior to Admission medications   Medication Sig Start Date End Date Taking? Authorizing Provider  traMADol (ULTRAM) 50 MG tablet Take 1 tablet (50 mg total) by mouth every 6 (six) hours as needed. 01/23/16   Loney Hering, MD    No Known Allergies  No family history on file.  Social History Social History   Tobacco Use  . Smoking status: Current Every Day Smoker    Packs/day: 1.00    Types: Cigarettes  . Smokeless tobacco: Never Used  Substance Use Topics  . Alcohol use: Yes  . Drug use: Yes    Types: Marijuana, Cocaine    Review of Systems Constitutional: Negative for fever.  Positive for generalized fatigue.  Positive for generalized body aches. Eyes: Negative for visual complaints ENT: Negative for recent illness/congestion Cardiovascular: Intermittent mild chest pain Respiratory: Positive for shortness of breath Gastrointestinal: Negative for abdominal pain.  Intermittent  nausea vomiting. Genitourinary occasional dark urine. Musculoskeletal: Negative for musculoskeletal complaints Skin: Negative for skin complaints  Neurological: Negative for headache All other ROS negative  ____________________________________________   PHYSICAL EXAM:  VITAL SIGNS: ED Triage Vitals  Enc Vitals Group     BP 10/10/17 0344 135/85     Pulse Rate 10/10/17 0344 (!) 107     Resp 10/10/17 0344 (!) 26     Temp 10/10/17 0344 98.4 F (36.9 C)     Temp Source 10/10/17 0344 Oral     SpO2 10/10/17 0344 97 %     Weight 10/10/17 0345 194 lb (88 kg)     Height 10/10/17 0345 6' (1.829 m)     Head Circumference --      Peak Flow --      Pain Score 10/10/17 0345 9     Pain Loc --      Pain Edu? --      Excl. in [place]? --    Constitutional: Alert and oriented.  Somewhat pale in appearance mildly diaphoretic.  Patient actively vomiting. Eyes: Normal exam ENT   Head: Normocephalic and atraumatic   Mouth/Throat: Mucous membranes are moist. Cardiovascular: Normal rate, regular rhythm. No murmur Respiratory: Normal respiratory effort without tachypnea nor retractions. Breath sounds are clear Gastrointestinal: Soft, no abdominal tenderness.  Moderate bilateral CVA tenderness. Musculoskeletal: Nontender with normal range of motion in all extremities. No lower extremity tenderness or edema. Neurologic:  Normal speech and language. No gross focal neurologic deficits  Skin:  Skin is warm, somewhat pale  in appearance with mild diaphoresis. Psychiatric: Mood and affect are normal.   ____________________________________________    EKG  EKG reviewed and interpreted by myself shows sinus tachycardia 107 bpm with a narrow QRS, normal axis, largely normal intervals with no concerning ST changes.  ____________________________________________    RADIOLOGY  Chest x-ray shows interstitial edema.  ____________________________________________   INITIAL IMPRESSION / ASSESSMENT AND  PLAN / ED COURSE  Pertinent labs & imaging results that were available during my care of the patient were reviewed by me and considered in my medical decision making (see chart for details).  Patient presents to the emergency department with various complaints ongoing for the last several months but worse over the past 2 to 3 days.  Differential is quite broad.  Patient's labs have resulted showing a troponin elevation of 0.12.  More concerningly however his creatinine is elevated to 13.2.  Last known labs was approximately 5 years ago at which time the kidney function was normal.  Discussed the patient with nephrology.  We will admit to the hospitalist service once remainder of the work-up has resulted.  She was feeling more short of breath.  IV fluids were stopped.  Second liter was canceled given interstitial edema finding on chest x-ray.  CT read is pending I reviewed it myself no sign of obstructive uropathy.  Patient is admitted to the hospitalist service.  She has become increasingly short of breath, now on 4 L of oxygen satting 90%.  Blood pressure is once again elevated 200/145.  We will place 1 inch of nitroglycerin paste, placed on BiPAP.  Patient is still able to talk in 3-4 word sentences.  Discussed again with nephrology, recommend admitting to the hospitalist to the ICU.  We will discussed with the hospitalist and likely moved to ICU admission.  CRITICAL CARE Performed by: Harvest Dark   Total critical care time: 30 minutes  Critical care time was exclusive of separately billable procedures and treating other patients.  Critical care was necessary to treat or prevent imminent or life-threatening deterioration.  Critical care was time spent personally by me on the following activities: development of treatment plan with patient and/or surrogate as well as nursing, discussions with consultants, evaluation of patient's response to treatment, examination of patient, obtaining  history from patient or surrogate, ordering and performing treatments and interventions, ordering and review of laboratory studies, ordering and review of radiographic studies, pulse oximetry and re-evaluation of patient's condition.   ____________________________________________   FINAL CLINICAL IMPRESSION(S) / ED DIAGNOSES  Renal failure Hypoxia Dyspnea   Harvest Dark, MD 10/10/17 1191    Harvest Dark, MD 10/10/17 [phone]

## 2017-10-10 NOTE — ED Triage Notes (Signed)
Pt arrives POV to triage with c/o chest pain x 2 days which is increasing in pain. Pt states that it "may be anxiety" but is unsure.

## 2017-10-10 NOTE — ED Notes (Signed)
Patient nauseous and vomiting x2, and dry heaving frequently. Provider made aware. See new orders.

## 2017-10-10 NOTE — ED Notes (Signed)
Pt placed on Bi-pap per MD order d/t pt being unable to hold O2 above 90% on 4L via nasal canula.

## 2017-10-10 NOTE — Progress Notes (Signed)
Pt wanted off bipap, stated he needed to use urinal. Placed on 4lpm [place] at this time, tolerating well sats 94-96%, respiratory rate 18/min, no shortness of breath noted. Will continue to monitor. RN made aware.

## 2017-10-10 NOTE — Progress Notes (Signed)
Pre HD Assessment- Pt states he was completely unaware of his kidney disease and drove himself to the ER when he couldn't breathe. He has many questions and concerns regarding dialysis and when he will be able to go back to work. Pt c/o of headache, RN made aware. Pt is stable to being Tx.    10/10/17 1630  Neurological  Level of Consciousness Alert  Orientation Level Oriented X4  Respiratory  Respiratory Pattern Labored  Chest Assessment Chest expansion symmetrical  Cough None  Cardiac  Pulse Regular  Heart Sounds S1, S2  ECG Monitor Yes  Vascular  R Radial Pulse +2  L Radial Pulse +2  Edema Generalized  Psychosocial  Psychosocial (WDL) WDL

## 2017-10-10 NOTE — H&P (Signed)
[place] [facility] [place] NAME: Jeffrey Holloway    MR#:  [id number]  DATE OF BIRTH:  1985/08/30  DATE OF ADMISSION:  10/10/2017  PRIMARY CARE PHYSICIAN: Patient, No Pcp Per   REQUESTING/REFERRING PHYSICIAN: Harvest Dark, MD  CHIEF COMPLAINT:   Chief Complaint  Patient presents with  . Chest Pain    HISTORY OF PRESENT ILLNESS:  Jeffrey Holloway  is a 32 y.o. male with a known history of GERD who p/w renal failure. Pt is lethargic, sleeping at the time of my Hx/examination. He is transiently arousable, opening his eyes and responding to external stimuli, but he is very tired, and falls back asleep quickly. Hx obtained from pt's wife at bedside. She is an excellent historian. 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 However, he has been having increasing difficulty, owing to fatigue/malaise/lethargy, and his wife states he has been sleeping more lately (she says he slept for almost the whole day leading up to hospitalization). He has had chronic nausea/vomiting x15mo, intermittent/sporadic but sometimes daily, typically occurring in the morning (though this has been less severe in the past 2wks). Pt has endorsed leg cramps/"Charley Horses" x3d. Pt's wife states pt has been complaining of 2d Hx severe CP + SOB, acutely worse x12hrs. He has been complaining of intermittent posterior headache x1d, blurred vision x1d, and abdominal/flank/back pain x1d. He had noticed his urinary quantity/frequency had decreased, and the urine had become a dark reddish color. He had chills, but no fevers. (-) diarrhea, diaphoresis, night sweats, rigors, LOC, burning/pain on urination. He was brought to the hospital due to worsening CP/SOB.  Pt's wife tells me that the pt is in reasonably good health. He has GERD, for which he takes Prilosec and frequently uses Tums. He uses Ibuprofen and Tylenol reportedly daily. His wife  says he eats lots of fast food, and has increased his consumption of nuts (peanuts and pistachios) in the past 2+wks. He does not take any other OTC medications/supplements of which we are aware. He does not have a known history of renal disease, to his wife's recollection.  PAST MEDICAL HISTORY:  History reviewed. No pertinent past medical history.  GERD  PAST SURGICAL HISTORY:   Past Surgical History:  Procedure Laterality Date  . APPENDECTOMY      SOCIAL HISTORY:   Social History   Tobacco Use  . Smoking status: Current Every Day Smoker    Packs/day: 1.00    Types: Cigarettes  . Smokeless tobacco: Never Used  Substance Use Topics  . Alcohol use: Yes   Smoker, 1ppd x~20yrs. Occasional/social EtOH, (-) recent EtOH in past 88mo. (+) marijuana, (-) cocaine/heroin. Owns a concrete business. (-) recent travel, (-) sick contacts. Lives in house w/ wife, son, daughter, dog.  FAMILY HISTORY:  History reviewed. No pertinent family history.   Biological father unknown. Mother alive & healthy. 2 brothers, alive & healthy. 1 son + 1 daughter, alive & healthy.  DRUG ALLERGIES:  No Known Allergies  REVIEW OF SYSTEMS:   Review of Systems  Constitutional: Positive for chills and malaise/fatigue. Negative for diaphoresis, fever and weight loss.  HENT: Negative for congestion, ear pain, hearing loss, nosebleeds, sore throat and tinnitus.   Eyes: Positive for blurred vision. Negative for double vision and photophobia.  Respiratory: Positive for shortness of breath. Negative for cough, hemoptysis, sputum production and wheezing.   Cardiovascular: Positive for chest pain. Negative  for palpitations, orthopnea, claudication, leg swelling and PND.  Gastrointestinal: Positive for abdominal pain, nausea and vomiting. Negative for blood in stool, constipation, diarrhea, heartburn and melena.  Genitourinary: Positive for flank pain. Negative for dysuria, frequency, hematuria and urgency.    Musculoskeletal: Positive for back pain and myalgias. Negative for falls, joint pain and neck pain.  Skin: Negative for itching and rash.  Neurological: Positive for weakness and headaches. Negative for dizziness, tingling, tremors, sensory change, speech change, focal weakness, seizures and loss of consciousness.   MEDICATIONS AT HOME:   Prior to Admission medications   Medication Sig Start Date End Date Taking? Authorizing Provider  traMADol (ULTRAM) 50 MG tablet Take 1 tablet (50 mg total) by mouth every 6 (six) hours as needed. Patient not taking: Reported on 10/10/2017 01/23/16   Loney Hering, MD      VITAL SIGNS:  Blood pressure (!) 187/135, pulse 91, temperature 98.4 F (36.9 C), temperature source Oral, resp. rate 20, height 6' (1.829 m), weight 88 kg (194 lb), SpO2 100 %.  PHYSICAL EXAMINATION:  Physical Exam  Constitutional: He appears well-developed and well-nourished. He appears lethargic. He is sleeping. He appears distressed. Face mask in place.  HENT:  Head: Normocephalic and atraumatic.  Eyes: Pupils are equal, round, and reactive to light. Conjunctivae, EOM and lids are normal. No scleral icterus.  Neck: Neck supple. No JVD present. No thyromegaly present.  Cardiovascular: Regular rhythm, S1 normal, S2 normal and normal heart sounds.  No extrasystoles are present. Tachycardia present. PMI is not displaced. Exam reveals no gallop, no S3, no S4, no distant heart sounds and no friction rub.  No murmur heard.  No systolic murmur is present.  No diastolic murmur is present. Pulmonary/Chest: No accessory muscle usage or stridor. Tachypnea noted. No bradypnea. He is in respiratory distress. He has no decreased breath sounds. He has no wheezes. He has rhonchi in the right lower field and the left lower field. He has no rales.  (+) bibasilar rhonchi.  Abdominal: Soft. Normal appearance and bowel sounds are normal. He exhibits no distension. There is no tenderness. There is  no rebound and no guarding.  Musculoskeletal: He exhibits no edema or tenderness.       Right lower leg: Normal. He exhibits no tenderness and no edema.       Left lower leg: Normal. He exhibits no tenderness and no edema.  Lymphadenopathy:    He has no cervical adenopathy.  Neurological: He appears lethargic.  Skin: Skin is warm, dry and intact. No rash noted. He is not diaphoretic. No erythema.   LABORATORY PANEL:   CBC Recent Labs  Lab 10/10/17 0350  WBC 7.8  HGB 10.0*  HCT 28.3*  PLT 198   ------------------------------------------------------------------------------------------------------------------  Chemistries  Recent Labs  Lab 10/10/17 0350  NA 140  K 3.8  CL 109  CO2 19*  GLUCOSE 102*  BUN 83*  CREATININE 13.25*  CALCIUM 6.9*   ------------------------------------------------------------------------------------------------------------------  Cardiac Enzymes Recent Labs  Lab 10/10/17 0350  TROPONINI 0.12*   ------------------------------------------------------------------------------------------------------------------  RADIOLOGY:  Dg Chest 2 View  Result Date: 10/10/2017 CLINICAL DATA:  32 year old male with chest pain. EXAM: CHEST - 2 VIEW COMPARISON:  None. FINDINGS: There is diffuse interstitial streaky densities and nodularity as well as Kerley B-lines. Findings consistent with interstitial edema. Superimposed pneumonia is not entirely excluded. Clinical correlation is recommended. There are probably trace bilateral pleural effusions. There is no focal consolidation or pneumothorax. The cardiac silhouette is within normal limits.  No acute osseous pathology. IMPRESSION: Interstitial edema. Superimposed pneumonia is not excluded. Clinical correlation is recommended. Electronically Signed   By: Anner Crete M.D.   On: 10/10/2017 05:03   Ct Renal Stone Study  Result Date: 10/10/2017 CLINICAL DATA:  Initial evaluation for acute bilateral flank pain.  EXAM: CT ABDOMEN AND PELVIS WITHOUT CONTRAST TECHNIQUE: Multidetector CT imaging of the abdomen and pelvis was performed following the standard protocol without IV contrast. COMPARISON:  Prior CT from 01/17/2012. FINDINGS: Lower chest: Layering bilateral pleural effusions. Diffuse interlobular septal thickening at the lung bases consistent with interstitial edema. Superimposed atelectatic changes. Hepatobiliary: Limited noncontrast evaluation of the liver is unremarkable. Gallbladder within normal limits. No biliary dilatation. Pancreas: Pancreas within normal limits. Spleen: Spleen within normal limits. Adrenals/Urinary Tract: Adrenal glands are normal. Kidneys equal in size without evidence for nephrolithiasis or hydronephrosis. No radiopaque calculi seen along the course of either renal collecting system. No hydroureter. Partially distended bladder within normal limits. No layering stones within the bladder lumen. Stomach/Bowel: Stomach within normal limits. No evidence for bowel obstruction. Appendix is absent. Mild colonic diverticulosis without evidence for acute diverticulitis. No acute inflammatory changes seen about the bowels. Vascular/Lymphatic: Intra-abdominal aorta of normal caliber. No adenopathy. Reproductive: Prostate normal. Other: No free air or fluid. Musculoskeletal: No acute osseous abnormality. No worrisome lytic or blastic osseous lesions. IMPRESSION: 1. No CT evidence for nephrolithiasis or obstructive uropathy. No other acute intra-abdominal or pelvic process. 2. Small moderate layering bilateral pleural effusions with evidence of pulmonary interstitial edema. 3. Mild colonic diverticulosis without evidence for acute diverticulitis. 4. Sequelae of prior appendectomy. Electronically Signed   By: Jeannine Boga M.D.   On: 10/10/2017 07:13   IMPRESSION AND PLAN:   A/P: 30M oliguric renal failure, volume overload, hypertensive emergency.  1.) Oliguric renal failure: Pt p/w AKI,  oliguria. Cr 13.25, BUN 83. K+ WNL (3.8). CT (-) obstructive uropathy. Etiology and prognosis unclear. Renal U/S, U/A, urine studies (electrolytes, protein, creatinine, urea nitrogen), ANA, ANCA, MPO/PR-3, C3, C4, SPEP, UPEP, HBV, HCV, PTH, Vitamin D, ionized calcium pending. Nephrology consulted, may need HD. Holding IVF, volume overloaded.  2.) Volume overload: Pt w/ volume overload, likely 2/2 oliguric renal failure. Exam (+) bibasilar rhonchi. Imaging (+) pulmonary edema, small/moderate pleural effusions. Started on BiPAP in ED, oxygenating well. As above, will likely need HD.  3.) Hypertensive emergency: BP as high as 190s/130s (at time of Hx/examination). Received Nitropaste in ED. Per ED physician, d/w Nephrology, who recommended Cardene gtt if persistent. As above, will benefit from HD.  4.) Hypocalcemia: Calcium 6.9. Ionized calcium, PTH, vitamin D levels pending.  5.) Normocytic anemia: Hgb 10.0, MCV 95.4. Possibly 2/2 renal failure. Low suspicion for active/acute bleeding. Monitor.  6.) Hyperglycemia: Glucose 102, mild. Monitor.  7.) Troponin elevation: Trop-I 0.12. Elevation likely 2/2 heart strain (high afterload), decreased renal clearance.  8.) FEN/GI: Renal diet.  9.) DVT PPx: Heparin 5000u SQ TID.  10.) Code status: Full code.  11.) Disposition: Admission, pt expected to stay > 2 midnights.   All the records are reviewed and case discussed with ED provider. Management plans discussed with the patient, family and they are in agreement.  CODE STATUS: Full code.  TOTAL TIME TAKING CARE OF THIS PATIENT: 90 minutes.    Arta Silence M.D on 10/10/2017 at 7:40 AM  Between 7am to 6pm - Pager - [phone]  After 6pm go to [email or web address] - [facility]  Office  [phone]  CC: Primary  care physician; Patient, No Pcp Per   Note: This dictation was prepared with Dragon dictation along with smaller phrase technology.  Any transcriptional errors that result from this process are unintentional.

## 2017-10-10 NOTE — Consult Note (Signed)
[facility] CONSULT NOTE    Date: 10/10/2017                  Patient Name:  Jeffrey Holloway  MRN: [id number]  DOB: 16-Apr-1986  Age / Sex: 32 y.o., male         PCP: Patient, No Pcp Per                 Service Requesting Consult: Emergency department                 Reason for Consult: Acute renal failure            History of Present Illness: Patient is a 32 y.o. male with a PMHx of GERD, who was admitted to [facility] on 10/10/2017 for evaluation of chest pain and shortness of breath.  Most of the history was obtained from the patient's wife as the patient is quite lethargic and falls asleep easily.  Over the past several months the patient has had early morning nausea and vomiting.  He is also had increasing fatigue recently.  No obvious signs of disclosure reported.  Over the past several days he is also had increasing shortness of breath as well as chest pain.  Patient's wife is also noted that his urine has become quite dark in color.  Patient has been taking NSAIDs frequently.  He was taking Advil 400 mg most days.  He was alternating this with Tylenol.  He denies any other over-the-counter supplements.  No known family history of end-stage renal disease.  Initial laboratory studies showed severe metabolic derangements.  BUN was quite high at 83 with a creatinine of 13.2.  Serum calcium low at 6.9.  Patient also has signs of anemia with a hemoglobin of 10.0.  Patient also has mild metabolic acidosis with a serum bicarbonate of 19.  He has not seen a physician for this issue recently.  In the emergency department blood pressure was found to be quite high and he is currently on BiPAP and nitroglycerin paste.   Medications: Outpatient medications:  (Not in a hospital admission)  Current medications: Current Facility-Administered Medications  Medication Dose Route Frequency Provider Last Rate Last Dose  . HYDROmorphone (DILAUDID) injection 0.5 mg  0.5 mg Intravenous  STAT Arta Silence, MD       Current Outpatient Medications  Medication Sig Dispense Refill  . calcium carbonate (TUMS - DOSED IN MG ELEMENTAL CALCIUM) 500 MG chewable tablet Chew 1 tablet by mouth 2 (two) times daily.    Marland Kitchen ibuprofen (ADVIL,MOTRIN) 200 MG tablet Take 600 mg by mouth every 6 (six) hours as needed for moderate pain.    Marland Kitchen omeprazole (PRILOSEC) 20 MG capsule Take 20 mg by mouth daily.    . traMADol (ULTRAM) 50 MG tablet Take 1 tablet (50 mg total) by mouth every 6 (six) hours as needed. (Patient not taking: Reported on 10/10/2017) 12 tablet 0      Allergies: No Known Allergies    Past Medical History: GERD  Past Surgical History: Past Surgical History:  Procedure Laterality Date  . APPENDECTOMY       Family History: No family history of ESRD.   Social History: Social History   Socioeconomic History  . Marital status: Married    Spouse name: Not on file  . Number of children: Not on file  . Years of education: Not on file  . Highest education level: Not on file  Occupational History  . Not  on file  Social Needs  . Financial resource strain: Not on file  . Food insecurity:    Worry: Not on file    Inability: Not on file  . Transportation needs:    Medical: Not on file    Non-medical: Not on file  Tobacco Use  . Smoking status: Current Every Day Smoker    Packs/day: 1.00    Types: Cigarettes  . Smokeless tobacco: Never Used  Substance and Sexual Activity  . Alcohol use: Yes  . Drug use: Yes    Types: Marijuana, Cocaine  . Sexual activity: Not on file  Lifestyle  . Physical activity:    Days per week: Not on file    Minutes per session: Not on file  . Stress: Not on file  Relationships  . Social connections:    Talks on phone: Not on file    Gets together: Not on file    Attends religious service: Not on file    Active member of club or organization: Not on file    Attends meetings of clubs or organizations: Not on file    Relationship  status: Not on file  . Intimate partner violence:    Fear of current or ex partner: Not on file    Emotionally abused: Not on file    Physically abused: Not on file    Forced sexual activity: Not on file  Other Topics Concern  . Not on file  Social History Narrative  . Not on file     Review of Systems: Review of Systems  Constitutional: Positive for malaise/fatigue. Negative for chills and fever.  HENT: Negative for congestion, hearing loss and tinnitus.   Eyes: Negative for blurred vision and double vision.  Respiratory: Positive for shortness of breath. Negative for cough and hemoptysis.   Cardiovascular: Positive for chest pain and orthopnea. Negative for leg swelling.  Gastrointestinal: Positive for abdominal pain, nausea and vomiting.  Genitourinary: Negative for dysuria and urgency.  Musculoskeletal: Positive for myalgias.  Skin: Negative for itching and rash.  Neurological: Negative for dizziness and focal weakness.  Endo/Heme/Allergies: Negative for polydipsia. Does not bruise/bleed easily.  Psychiatric/Behavioral: Negative for depression. The patient is not nervous/anxious.      Vital Signs: Blood pressure (!) 165/117, pulse 79, temperature 98.4 F (36.9 C), temperature source Oral, resp. rate 14, height 6' (1.829 m), weight 88 kg (194 lb), SpO2 98 %.  Weight trends: Filed Weights   10/10/17 0345  Weight: 88 kg (194 lb)    Physical Exam: General: Critically ill appearing, on bipap  Head: Normocephalic, atraumatic.  Eyes: Anicteric, EOMI  Nose: Mucous membranes moist, not inflammed, nonerythematous.  Throat: Oropharynx nonerythematous, no exudate appreciated.   Neck: Supple, trachea midline.  Lungs:  Basilar rales, increased work of breathing  Heart: S1S2 no rubs  Abdomen:  Soft, NTND, BS present  Extremities: trace pretibial edema.  Neurologic: Lethargic but arousable, falls asleep easily.   Skin: No visible rashes, scars.    Lab results: Basic  Metabolic Panel: Recent Labs  Lab 10/10/17 0350  NA 140  K 3.8  CL 109  CO2 19*  GLUCOSE 102*  BUN 83*  CREATININE 13.25*  CALCIUM 6.9*    Liver Function Tests: No results for input(s): AST, ALT, ALKPHOS, BILITOT, PROT, ALBUMIN in the last 168 hours. No results for input(s): LIPASE, AMYLASE in the last 168 hours. No results for input(s): AMMONIA in the last 168 hours.  CBC: Recent Labs  Lab 10/10/17 0350  WBC 7.8  HGB 10.0*  HCT 28.3*  MCV 95.4  PLT 198    Cardiac Enzymes: Recent Labs  Lab 10/10/17 0350  CKTOTAL 672*  TROPONINI 0.12*    BNP: Invalid input(s): POCBNP  CBG: No results for input(s): GLUCAP in the last 168 hours.  Microbiology: No results found for this or any previous visit.  Coagulation Studies: No results for input(s): LABPROT, INR in the last 72 hours.  Urinalysis: Recent Labs    10/10/17 0850  COLORURINE YELLOW*  LABSPEC 1.011  PHURINE 5.0  GLUCOSEU 50*  HGBUR LARGE*  BILIRUBINUR NEGATIVE  KETONESUR 5*  PROTEINUR >=300*  NITRITE NEGATIVE  LEUKOCYTESUR NEGATIVE      Imaging: Dg Chest 2 View  Result Date: 10/10/2017 CLINICAL DATA:  32 year old male with chest pain. EXAM: CHEST - 2 VIEW COMPARISON:  None. FINDINGS: There is diffuse interstitial streaky densities and nodularity as well as Kerley B-lines. Findings consistent with interstitial edema. Superimposed pneumonia is not entirely excluded. Clinical correlation is recommended. There are probably trace bilateral pleural effusions. There is no focal consolidation or pneumothorax. The cardiac silhouette is within normal limits. No acute osseous pathology. IMPRESSION: Interstitial edema. Superimposed pneumonia is not excluded. Clinical correlation is recommended. Electronically Signed   By: Anner Crete M.D.   On: 10/10/2017 05:03   Ct Renal Stone Study  Result Date: 10/10/2017 CLINICAL DATA:  Initial evaluation for acute bilateral flank pain. EXAM: CT ABDOMEN AND PELVIS  WITHOUT CONTRAST TECHNIQUE: Multidetector CT imaging of the abdomen and pelvis was performed following the standard protocol without IV contrast. COMPARISON:  Prior CT from 01/17/2012. FINDINGS: Lower chest: Layering bilateral pleural effusions. Diffuse interlobular septal thickening at the lung bases consistent with interstitial edema. Superimposed atelectatic changes. Hepatobiliary: Limited noncontrast evaluation of the liver is unremarkable. Gallbladder within normal limits. No biliary dilatation. Pancreas: Pancreas within normal limits. Spleen: Spleen within normal limits. Adrenals/Urinary Tract: Adrenal glands are normal. Kidneys equal in size without evidence for nephrolithiasis or hydronephrosis. No radiopaque calculi seen along the course of either renal collecting system. No hydroureter. Partially distended bladder within normal limits. No layering stones within the bladder lumen. Stomach/Bowel: Stomach within normal limits. No evidence for bowel obstruction. Appendix is absent. Mild colonic diverticulosis without evidence for acute diverticulitis. No acute inflammatory changes seen about the bowels. Vascular/Lymphatic: Intra-abdominal aorta of normal caliber. No adenopathy. Reproductive: Prostate normal. Other: No free air or fluid. Musculoskeletal: No acute osseous abnormality. No worrisome lytic or blastic osseous lesions. IMPRESSION: 1. No CT evidence for nephrolithiasis or obstructive uropathy. No other acute intra-abdominal or pelvic process. 2. Small moderate layering bilateral pleural effusions with evidence of pulmonary interstitial edema. 3. Mild colonic diverticulosis without evidence for acute diverticulitis. 4. Sequelae of prior appendectomy. Electronically Signed   By: Jeannine Boga M.D.   On: 10/10/2017 07:13      Assessment & Plan: Pt is a 32 y.o. male  with a PMHx of GERD, who was admitted to [facility] on 10/10/2017 for evaluation of chest pain and shortness of breath.  Patient found  to have severe metabolic derangements upon admission with a BUN of 83, creatinine 10.2, hemoglobin 10, calcium 6.9.  Urine protein was 300 mill grams per deciliter on spot urinalysis.  Patient had history of taking NSAIDs for prolonged period of time.  Blood pressure also very high upon admission.  1.  Acute renal failure versus end-stage renal disease.  The patient's most recent creatinine on file in the system was from 2013 at which point  time creatinine was 0.9.  Creatinine now is 13.  He is having uremic symptoms as well as volume overload.  There does appear to be some chronicity to his disease as he has had nausea and vomiting for at least 2 months.  Patient was taking NSAIDs at least every other day.  He denies any other supplement intake.  At the moment we do need to treat his renal failure with renal placement therapy.  Patient has agreed upon this and will be having temporary dialysis catheter placed and we will follow with his first dialysis treatment with a duration of 1.5 hours, blood flow rate of 150, dialysate flow rate of 300, and no ultrafiltration at the moment.  We will plan for his second dialysis treatment tomorrow.  In addition we have ordered extensive serologic work-up including SPEP, UPEP, ANA, ANCA antibodies, GBM antibodies, C3, C4, and hepatitis serologies.  He may end up requiring renal biopsy as well however we will obtain renal ultrasound first to determine size and shape of his kidneys.  2.  Pulmonary edema.  Patient currently on BiPAP.  He is also on nitroglycerin.  Consider adding nicardipine drip if blood pressure does not come down.  3.  Hypertension.  Pressure quite high upon admission.  May be contributing to his underlying renal insufficiency.  Consider nicardipine drip if blood pressure control continues to be an issue.  4.  Anemia unspecified.  Patient may have underlying anemia of chronic kidney disease.  No recent baseline creatinine as above.  We will hold off on  Epogen for now.

## 2017-10-10 NOTE — Progress Notes (Signed)
Post HD Tx    10/10/17 1823  Hand-Off documentation  Report given to (Full Name) Deloria Lair, RN  Report received from (Full Name) Beatris Ship, RN   Vital Signs  Temp 97.8 F (36.6 C)  Temp Source Oral  Pulse Rate 76  Pulse Rate Source Monitor  Resp 12  BP (!) 170/108  BP Location Right Arm  BP Method Automatic  Patient Position (if appropriate) Lying  Oxygen Therapy  SpO2 95 %  O2 Device Nasal Cannula  O2 Flow Rate (L/min) 4 L/min  Pulse Oximetry Type Continuous  Pain Assessment  Pain Scale 0-10  Pain Score 6  Pain Type Acute pain (headache3)  Dialysis Weight  Weight 77.1 kg (169 lb 15.6 oz)  Type of Weight Post-Dialysis  Post-Hemodialysis Assessment  Rinseback Volume (mL) 500 mL  Dialyzer Clearance Lightly streaked  Duration of HD Treatment -hour(s) 1.5 hour(s)  Hemodialysis Intake (mL) 750 mL  UF Total -Machine (mL) 1323 mL  Net UF (mL) 573 mL  Tolerated HD Treatment Yes  Hemodialysis Catheter Left Internal jugular  Placement Date/Time: 10/10/17 1236   Placed prior to admission: No  Orientation: Left  Access Location: Internal jugular  Site Condition No complications  Blue Lumen Status Heparin locked  Red Lumen Status Heparin locked  Purple Lumen Status Capped (Central line)  Catheter fill solution Heparin 1000 units/ml  Catheter fill volume (Arterial) 1.4 cc  Catheter fill volume (Venous) 1.4  Dressing Type Biopatch;Occlusive  Dressing Status Clean;Dry;Intact  Post treatment catheter status Capped and Clamped

## 2017-10-10 NOTE — Progress Notes (Signed)
[place] [facility] [place] NAME: Jeffrey Holloway    MR#:  [id number]  DATE OF BIRTH:  1985-09-01  SUBJECTIVE:   Pt. Here due to chest pain, Shortness of breath and noted to be in acute renal failure, Hypertensive Urgency. Pt. Presently on Bipap and family at bedside.   REVIEW OF SYSTEMS:    Review of Systems  Constitutional: Negative for chills and fever.  HENT: Negative for congestion and tinnitus.   Eyes: Negative for blurred vision and double vision.  Respiratory: Positive for shortness of breath. Negative for cough and wheezing.   Cardiovascular: Negative for chest pain, orthopnea and PND.  Gastrointestinal: Negative for abdominal pain, diarrhea, nausea and vomiting.  Genitourinary: Negative for dysuria and hematuria.  Neurological: Negative for dizziness, sensory change and focal weakness.  All other systems reviewed and are negative.   Nutrition: Renal/Heart healthy Tolerating Diet: Yes Tolerating PT: Await Eval.    DRUG ALLERGIES:  No Known Allergies  VITALS:  Blood pressure (!) 192/132, pulse 94, temperature 97.9 F (36.6 C), temperature source Oral, resp. rate 14, height 6' (1.829 m), weight 83.6 kg (184 lb 4.9 oz), SpO2 92 %.  PHYSICAL EXAMINATION:   Physical Exam  GENERAL:  32 y.o.-year-old patient lying in bed on Bipap in mild Resp. Distress.  EYES: Pupils equal, round, reactive to light and accommodation. No scleral icterus. Extraocular muscles intact.  HEENT: Head atraumatic, normocephalic. Oropharynx and nasopharynx clear.  NECK:  Supple, no jugular venous distention. No thyroid enlargement, no tenderness.  LUNGS: Normal breath sounds bilaterally, no wheezing, rales, rhonchi. No use of accessory muscles of respiration.  CARDIOVASCULAR: S1, S2 normal. No murmurs, rubs, or gallops.  ABDOMEN: Soft, nontender, nondistended. Bowel sounds present. No organomegaly or mass.  EXTREMITIES: No cyanosis, clubbing or edema b/l.     NEUROLOGIC: Cranial nerves II through XII are intact. No focal Motor or sensory deficits b/l.   PSYCHIATRIC: The patient is alert and oriented x 3.  SKIN: No obvious rash, lesion, or ulcer.    LABORATORY PANEL:   CBC Recent Labs  Lab 10/10/17 0350  WBC 7.8  HGB 10.0*  HCT 28.3*  PLT 198   ------------------------------------------------------------------------------------------------------------------  Chemistries  Recent Labs  Lab 10/10/17 0350  NA 140  K 3.8  CL 109  CO2 19*  GLUCOSE 102*  BUN 83*  CREATININE 13.25*  CALCIUM 6.9*   ------------------------------------------------------------------------------------------------------------------  Cardiac Enzymes Recent Labs  Lab 10/10/17 0350  TROPONINI 0.12*   ------------------------------------------------------------------------------------------------------------------  RADIOLOGY:  Dg Chest 2 View  Result Date: 10/10/2017 CLINICAL DATA:  32 year old male with chest pain. EXAM: CHEST - 2 VIEW COMPARISON:  None. FINDINGS: There is diffuse interstitial streaky densities and nodularity as well as Kerley B-lines. Findings consistent with interstitial edema. Superimposed pneumonia is not entirely excluded. Clinical correlation is recommended. There are probably trace bilateral pleural effusions. There is no focal consolidation or pneumothorax. The cardiac silhouette is within normal limits. No acute osseous pathology. IMPRESSION: Interstitial edema. Superimposed pneumonia is not excluded. Clinical correlation is recommended. Electronically Signed   By: Anner Crete M.D.   On: 10/10/2017 05:03   Dg Chest Port 1 View  Result Date: 10/10/2017 CLINICAL DATA:  Encounter for central line placement. EXAM: PORTABLE CHEST 1 VIEW COMPARISON:  Portable film earlier today. FINDINGS: The heart is enlarged. Perihilar, RIGHT greater than LEFT infiltrates are increased. RIGHT pleural effusion. Dual lumen catheter has been placed  from a LEFT IJ approach. Tip lies in the mid to  distal SVC. There is no pneumothorax. IMPRESSION: Worsening aeration, particularly on the RIGHT. Dual lumen catheter tip mid to distal SVC. LEFT IJ approach. No pneumothorax. Electronically Signed   By: Staci Righter M.D.   On: 10/10/2017 15:17   Ct Renal Stone Study  Result Date: 10/10/2017 CLINICAL DATA:  Initial evaluation for acute bilateral flank pain. EXAM: CT ABDOMEN AND PELVIS WITHOUT CONTRAST TECHNIQUE: Multidetector CT imaging of the abdomen and pelvis was performed following the standard protocol without IV contrast. COMPARISON:  Prior CT from 01/17/2012. FINDINGS: Lower chest: Layering bilateral pleural effusions. Diffuse interlobular septal thickening at the lung bases consistent with interstitial edema. Superimposed atelectatic changes. Hepatobiliary: Limited noncontrast evaluation of the liver is unremarkable. Gallbladder within normal limits. No biliary dilatation. Pancreas: Pancreas within normal limits. Spleen: Spleen within normal limits. Adrenals/Urinary Tract: Adrenal glands are normal. Kidneys equal in size without evidence for nephrolithiasis or hydronephrosis. No radiopaque calculi seen along the course of either renal collecting system. No hydroureter. Partially distended bladder within normal limits. No layering stones within the bladder lumen. Stomach/Bowel: Stomach within normal limits. No evidence for bowel obstruction. Appendix is absent. Mild colonic diverticulosis without evidence for acute diverticulitis. No acute inflammatory changes seen about the bowels. Vascular/Lymphatic: Intra-abdominal aorta of normal caliber. No adenopathy. Reproductive: Prostate normal. Other: No free air or fluid. Musculoskeletal: No acute osseous abnormality. No worrisome lytic or blastic osseous lesions. IMPRESSION: 1. No CT evidence for nephrolithiasis or obstructive uropathy. No other acute intra-abdominal or pelvic process. 2. Small moderate layering  bilateral pleural effusions with evidence of pulmonary interstitial edema. 3. Mild colonic diverticulosis without evidence for acute diverticulitis. 4. Sequelae of prior appendectomy. Electronically Signed   By: Jeannine Boga M.D.   On: 10/10/2017 07:13     ASSESSMENT AND PLAN:   32 yo male w/ no significant PMH who presented to the hospital due to shortness of breath, chest pain noted to be in acute renal failure with hypertensive urgency.  1.  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 - Patient's creatinine is presently elevated to over 13. - Patient having symptoms of uremia with nausea and volume overload and therefore seen by nephrology and to start on hemodialysis today. - Nephrology consult appreciated and serologic work-up has been initiated. - Possible need for renal biopsy if not improving.  Follow electrolytes  2.  Acute respiratory failure with hypoxia-secondary to volume overload and pulmonary edema. - Patient noted to be acute kidney injury and therefore plan to get urgent hemodialysis for fluid removal. -Currently on BiPAP and wean off as tolerated.  3.  Accelerated hypertension- she has no previous history of essential hypertension. - Started on nicardipine drip, continue as needed IV hydralazine -Follow hemodynamics.  Continue nitroglycerin paste.  4.  Elevated troponin-secondary to supply demand ischemia and poor renal clearance from renal failure. - Await echocardiogram results, keep on telemetry, cycle markers.  No evidence of acute coronary syndrome.  5.  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 - Hemoglobin stable and will continue to monitor.     All the records are reviewed and case discussed with Care Management/Social Worker. Management plans discussed with the patient, family and they are in agreement.  CODE STATUS: Full code  DVT Prophylaxis: Heparin subcu  TOTAL TIME TAKING CARE OF THIS  PATIENT: 30 minutes.   POSSIBLE D/C IN 3-4 DAYS, DEPENDING ON CLINICAL CONDITION.   Henreitta Leber M.D on 10/10/2017 at 5:07 PM  Between 7am to 6pm - Pager -  [phone]  After 6pm go to [email or web address] - Proofreader  [facility]  Office  [phone]  CC: Primary care physician; Patient, No Pcp Per

## 2017-10-10 NOTE — ED Notes (Signed)
Pt transported to CCU by [place] RN and RT. Placed back on bipap prior to transport to CCU room 9.

## 2017-10-10 NOTE — Consult Note (Signed)
Reason for Consult: Renal failure Referring Physician: Dr. Crist Holloway is an 32 y.o. male.  Jeffrey Holloway  is a 32 y.o. 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 His wife states he has been sleeping more lately. He is also had some leg cramps. Was brought into the emergency department and was found to be in acute renal failure, pulmonary edema secondary to renal failure, requiring BiPAP, BUN of 83, creatinine 13.25, total CK 672, troponin 0.12, protein in the urine reported, anemia without any evidence of active bleeding. When I examined him he was lethargic but arousable presently on BiPAP.   History reviewed. No pertinent past medical history.  Past Surgical History:  Procedure Laterality Date  . APPENDECTOMY      History reviewed. No pertinent family history.  Social History:  reports that he has been smoking cigarettes.  He has been smoking about 1.00 pack per day. He has never used smokeless tobacco. He reports that he drinks alcohol. He reports that he has current or past drug history. Drugs: Marijuana and Cocaine.  Allergies: No Known Allergies  Medications: I have reviewed the patient's current medications.  Results for orders placed or performed during the hospital encounter of 10/10/17 (from the past 48 hour(s))  Basic metabolic panel     Status: Abnormal   Collection Time: 10/10/17  3:50 AM  Result Value Ref Range   Sodium 140 135 - 145 mmol/L   Potassium 3.8 3.5 - 5.1 mmol/L   Chloride 109 101 - 111 mmol/L   CO2 19 (L) 22 - 32 mmol/L   Glucose, Bld 102 (H) 65 - 99 mg/dL   BUN 83 (H) 6 - 20 mg/dL   Creatinine, Ser 13.25 (H) 0.61 - 1.24 mg/dL   Calcium 6.9 (L) 8.9 - 10.3 mg/dL   GFR calc non Af Amer 4 (L) >60 mL/min    GFR calc Af Amer 5 (L) >60 mL/min    Comment: (NOTE) The eGFR has been calculated using the CKD EPI equation. This calculation has not been validated in all clinical situations. eGFR's persistently <60 mL/min signify possible Chronic Kidney Disease.    Anion gap 12 5 - 15    Comment: Performed at [facility], [address] [place], [place] [address]  CBC     Status: Abnormal   Collection Time: 10/10/17  3:50 AM  Result Value Ref Range   WBC 7.8 3.8 - 10.6 K/uL   RBC 2.97 (L) 4.40 - 5.90 MIL/uL   Hemoglobin 10.0 (L) 13.0 - 18.0 g/dL   HCT 28.3 (L) 40.0 - 52.0 %   MCV 95.4 80.0 - 100.0 fL   MCH 33.6 26.0 - 34.0 pg   MCHC 35.2 32.0 - 36.0 g/dL   RDW 13.9 11.5 - 14.5 %   Platelets 198 150 - 440 K/uL    Comment: Performed at [facility], [address] [place], [place] [address]  Troponin I     Status: Abnormal   Collection Time: 10/10/17  3:50 AM  Result Value Ref Range   Troponin I 0.12 (HH) <0.03 ng/mL    Comment: CRITICAL RESULT CALLED TO, READ BACK BY AND VERIFIED WITH VANESSA ASHLEY AT 8099 ON 10/10/17 [place]. Performed at [facility], [address],  [place], [place] [address]   CK     Status: Abnormal   Collection Time: 10/10/17  3:50 AM  Result Value Ref Range   Total CK 672 (H) 49 - 397 U/L    Comment: Performed at [facility], [address] [place], [place] [address]  Urinalysis, Complete w Microscopic     Status: Abnormal   Collection Time: 10/10/17  8:50 AM  Result Value Ref Range   Color, Urine YELLOW (A) YELLOW   APPearance HAZY (A) CLEAR   Specific Gravity, Urine 1.011 1.005 - 1.030   pH 5.0 5.0 - 8.0   Glucose, UA 50 (A) NEGATIVE mg/dL   Hgb urine dipstick LARGE (A) NEGATIVE   Bilirubin Urine NEGATIVE NEGATIVE   Ketones, ur 5 (A) NEGATIVE mg/dL   Protein, ur >=300 (A) NEGATIVE mg/dL   Nitrite NEGATIVE NEGATIVE   Leukocytes, UA NEGATIVE NEGATIVE   RBC / HPF >50 (H) 0 - 5 RBC/hpf   WBC, UA 21-50 0 - 5 WBC/hpf    Bacteria, UA NONE SEEN NONE SEEN   Squamous Epithelial / LPF 0-5 0 - 5    Comment: Please note change in reference range. Performed at [facility], [address] [place], [place] [address]     Dg Chest 2 View  Result Date: 10/10/2017 CLINICAL DATA:  32 year old male with chest pain. EXAM: CHEST - 2 VIEW COMPARISON:  None. FINDINGS: There is diffuse interstitial streaky densities and nodularity as well as Kerley B-lines. Findings consistent with interstitial edema. Superimposed pneumonia is not entirely excluded. Clinical correlation is recommended. There are probably trace bilateral pleural effusions. There is no focal consolidation or pneumothorax. The cardiac silhouette is within normal limits. No acute osseous pathology. IMPRESSION: Interstitial edema. Superimposed pneumonia is not excluded. Clinical correlation is recommended. Electronically Signed   By: Anner Crete M.D.   On: 10/10/2017 05:03   Ct Renal Stone Study  Result Date: 10/10/2017 CLINICAL DATA:  Initial evaluation for acute bilateral flank pain. EXAM: CT ABDOMEN AND PELVIS WITHOUT CONTRAST TECHNIQUE: Multidetector CT imaging of the abdomen and pelvis was performed following the standard protocol without IV contrast. COMPARISON:  Prior CT from 01/17/2012. FINDINGS: Lower chest: Layering bilateral pleural effusions. Diffuse interlobular septal thickening at the lung bases consistent with interstitial edema. Superimposed atelectatic changes. Hepatobiliary: Limited noncontrast evaluation of the liver is unremarkable. Gallbladder within normal limits. No biliary dilatation. Pancreas: Pancreas within normal limits. Spleen: Spleen within normal limits. Adrenals/Urinary Tract: Adrenal glands are normal. Kidneys equal in size without evidence for nephrolithiasis or hydronephrosis. No radiopaque calculi seen along the course of either renal collecting system. No hydroureter. Partially distended bladder within normal limits. No  layering stones within the bladder lumen. Stomach/Bowel: Stomach within normal limits. No evidence for bowel obstruction. Appendix is absent. Mild colonic diverticulosis without evidence for acute diverticulitis. No acute inflammatory changes seen about the bowels. Vascular/Lymphatic: Intra-abdominal aorta of normal caliber. No adenopathy. Reproductive: Prostate normal. Other: No free air or fluid. Musculoskeletal: No acute osseous abnormality. No worrisome lytic or blastic osseous lesions. IMPRESSION: 1. No CT evidence for nephrolithiasis or obstructive uropathy. No other acute intra-abdominal or pelvic process. 2. Small moderate layering bilateral pleural effusions with evidence of pulmonary interstitial edema. 3. Mild colonic diverticulosis without evidence for acute diverticulitis. 4. Sequelae of prior appendectomy. Electronically Signed   By: Jeannine Boga M.D.   On: 10/10/2017 07:13    ROS Blood pressure (!) 151/110, pulse 78, temperature 98.4 F (36.9 C), temperature source Oral, resp.  rate 12, height 6' (1.829 m), weight 194 lb (88 kg), SpO2 98 %. Physical Exam  Patient is presently on BiPAP, somnolent but arousable Vital signs: Please see above listed vital signs HEENT: Limited oral exam secondary to BiPAP, trachea is midline, no clear jugular venous distention is noted, no stridor or accessory muscle utilization Cardiovascular: Regular rate and rhythm, tachycardia Pulmonary: Diffuse rales appreciated bilaterally posterior Abdominal: Positive bowel sounds, soft exam Extremities: No clubbing cyanosis or edema noted Neurologic: Moves all extremities, no clear focal deficits appreciated, again patient is somnolent but arousable Cutaneous: No rashes or lesions noted, no palpable purpura  Assessment/Plan:  Acute renal failure, patient with pulmonary edema secondary to renal failure requiring BiPAP. Creatinine 13.25 patient will need hemodialysis. Discussed with nephrology when patient  arrives to the intensive care unit were placed dialysis catheter and started on hemodialysis. Workup has been instituted for cause of acute renal failure to include ANA, c3, c4 SPEP, ANCA along with viral serology. Renal Imagine.   , 10/10/2017, 10:58 AM

## 2017-10-10 NOTE — ED Notes (Signed)
Pt taken off bipap and placed on 4 L nasal cannula at this time. Will continue to monitor.

## 2017-10-10 NOTE — Progress Notes (Signed)
HD Tx ended    10/10/17 1815  Vital Signs  Pulse Rate 83  Pulse Rate Source Monitor  Resp 12  BP (!) 188/110  BP Location Right Arm  BP Method Automatic  Patient Position (if appropriate) Lying  Oxygen Therapy  SpO2 95 %  O2 Device Nasal Cannula  O2 Flow Rate (L/min) 4 L/min  Pulse Oximetry Type Continuous  During Hemodialysis Assessment  Blood Flow Rate (mL/min) 150 mL/min  Arterial Pressure (mmHg) -50 mmHg  Venous Pressure (mmHg) 50 mmHg  Transmembrane Pressure (mmHg) 40 mmHg  Ultrafiltration Rate (mL/min) 250 mL/min  Dialysate Flow Rate (mL/min) 300 ml/min  Conductivity: Machine  13.5  HD Safety Checks Performed Yes  Dialysis Fluid Bolus Normal Saline  Bolus Amount (mL) 250 mL  Intra-Hemodialysis Comments Tx completed;Tolerated well

## 2017-10-11 LAB — CBC
HEMATOCRIT: 27.3 % — AB (ref 40.0–52.0)
HEMOGLOBIN: 9.5 g/dL — AB (ref 13.0–18.0)
MCH: 33.2 pg (ref 26.0–34.0)
MCHC: 34.8 g/dL (ref 32.0–36.0)
MCV: 95.3 fL (ref 80.0–100.0)
Platelets: 158 10*3/uL (ref 150–440)
RBC: 2.86 MIL/uL — ABNORMAL LOW (ref 4.40–5.90)
RDW: 14.1 % (ref 11.5–14.5)
WBC: 7.5 10*3/uL (ref 3.8–10.6)

## 2017-10-11 LAB — URINE DRUG SCREEN, QUALITATIVE (ARMC ONLY)
Amphetamines, Ur Screen: NOT DETECTED
BENZODIAZEPINE, UR SCRN: NOT DETECTED
Barbiturates, Ur Screen: NOT DETECTED
COCAINE METABOLITE, UR ~~LOC~~: POSITIVE — AB
Cannabinoid 50 Ng, Ur ~~LOC~~: POSITIVE — AB
MDMA (Ecstasy)Ur Screen: NOT DETECTED
METHADONE SCREEN, URINE: NOT DETECTED
OPIATE, UR SCREEN: POSITIVE — AB
PHENCYCLIDINE (PCP) UR S: NOT DETECTED
Tricyclic, Ur Screen: NOT DETECTED

## 2017-10-11 LAB — PTH, INTACT AND CALCIUM
Calcium, Total (PTH): 7.1 mg/dL — ABNORMAL LOW (ref 8.7–10.2)
PTH: 188 pg/mL — ABNORMAL HIGH (ref 15–65)

## 2017-10-11 LAB — PHOSPHORUS: Phosphorus: 7.3 mg/dL — ABNORMAL HIGH (ref 2.5–4.6)

## 2017-10-11 LAB — BASIC METABOLIC PANEL
ANION GAP: 13 (ref 5–15)
BUN: 70 mg/dL — AB (ref 6–20)
CALCIUM: 7.6 mg/dL — AB (ref 8.9–10.3)
CO2: 19 mmol/L — AB (ref 22–32)
Chloride: 107 mmol/L (ref 101–111)
Creatinine, Ser: 11.09 mg/dL — ABNORMAL HIGH (ref 0.61–1.24)
GFR calc Af Amer: 6 mL/min — ABNORMAL LOW (ref >60)
GFR calc non Af Amer: 5 mL/min — ABNORMAL LOW (ref >60)
GLUCOSE: 90 mg/dL (ref 65–99)
Potassium: 3.6 mmol/L (ref 3.5–5.1)
Sodium: 139 mmol/L (ref 135–145)

## 2017-10-11 LAB — MPO/PR-3 (ANCA) ANTIBODIES
ANCA Proteinase 3: <3.5 U/mL (ref 0.0–3.5)
ANCA Proteinase 3: <3.5 U/mL (ref 0.0–3.5)
MYELOPEROXIDASE ABS: 74 U/mL — AB (ref 0.0–9.0)
Myeloperoxidase Abs: 64.4 U/mL — ABNORMAL HIGH (ref 0.0–9.0)

## 2017-10-11 LAB — GLOMERULAR BASEMENT MEMBRANE ANTIBODIES
GBM AB: 4 U (ref 0–20)
GBM Ab: 4 units (ref 0–20)

## 2017-10-11 LAB — GLUCOSE, CAPILLARY: Glucose-Capillary: 99 mg/dL (ref 65–99)

## 2017-10-11 LAB — NA AND K (SODIUM & POTASSIUM), RAND UR
POTASSIUM UR: 19 mmol/L
SODIUM UR: 81 mmol/L

## 2017-10-11 LAB — PROTEIN / CREATININE RATIO, URINE
CREATININE, URINE: 71 mg/dL
PROTEIN CREATININE RATIO: 10.49 mg/mg{creat} — AB (ref 0.00–0.15)
Total Protein, Urine: 745 mg/dL

## 2017-10-11 LAB — PROTEIN, URINE, RANDOM: TOTAL PROTEIN, URINE: 740 mg/dL

## 2017-10-11 LAB — HEPATITIS B SURFACE ANTIGEN
HEP B S AG: NEGATIVE
Hepatitis B Surface Ag: NEGATIVE

## 2017-10-11 LAB — HEPATITIS B CORE ANTIBODY, TOTAL: HEP B C TOTAL AB: NEGATIVE

## 2017-10-11 LAB — ANA W/REFLEX IF POSITIVE
Anti Nuclear Antibody(ANA): NEGATIVE
Anti Nuclear Antibody(ANA): NEGATIVE

## 2017-10-11 LAB — HCV INTERPRETATION

## 2017-10-11 LAB — PARATHYROID HORMONE, INTACT (NO CA): PTH: 190 pg/mL — ABNORMAL HIGH (ref 15–65)

## 2017-10-11 LAB — HCV COMMENT:

## 2017-10-11 LAB — CREATININE, URINE, RANDOM: CREATININE, URINE: 71 mg/dL

## 2017-10-11 LAB — HEPATITIS B CORE ANTIBODY, IGM: HEP B C IGM: NEGATIVE

## 2017-10-11 LAB — HEPATITIS C ANTIBODY (REFLEX): HCV Ab: <0.1 s/co ratio (ref 0.0–0.9)

## 2017-10-11 LAB — C4 COMPLEMENT
COMPLEMENT C4, BODY FLUID: 26 mg/dL (ref 14–44)
COMPLEMENT C4, BODY FLUID: 26 mg/dL (ref 14–44)

## 2017-10-11 LAB — HEPATITIS B SURFACE ANTIBODY, QUANTITATIVE: HEPATITIS B-POST: 123.2 m[IU]/mL

## 2017-10-11 LAB — HEPATITIS B SURFACE ANTIBODY,QUALITATIVE: Hep B S Ab: REACTIVE

## 2017-10-11 LAB — VITAMIN D 25 HYDROXY (VIT D DEFICIENCY, FRACTURES): VIT D 25 HYDROXY: 18 ng/mL — AB (ref 30.0–100.0)

## 2017-10-11 LAB — C3 COMPLEMENT
C3 COMPLEMENT: 126 mg/dL (ref 82–167)
C3 Complement: 127 mg/dL (ref 82–167)

## 2017-10-11 LAB — HCV AB W REFLEX TO QUANT PCR: HCV Ab: <0.1 s/co ratio (ref 0.0–0.9)

## 2017-10-11 LAB — CALCIUM, IONIZED: Calcium, Ionized, Serum: 4 mg/dL — ABNORMAL LOW (ref 4.5–5.6)

## 2017-10-11 LAB — HIV ANTIBODY (ROUTINE TESTING W REFLEX): HIV Screen 4th Generation wRfx: NONREACTIVE

## 2017-10-11 MED ORDER — HYDRALAZINE HCL 50 MG PO TABS
50.0000 mg | ORAL_TABLET | Freq: Three times a day (TID) | ORAL | Status: DC
  Administered 2017-10-11 – 2017-10-12 (×3): 50 mg via ORAL
  Filled 2017-10-11 (×3): qty 1

## 2017-10-11 MED ORDER — AMLODIPINE BESYLATE 5 MG PO TABS
5.0000 mg | ORAL_TABLET | Freq: Every day | ORAL | Status: DC
  Administered 2017-10-11: 5 mg via ORAL
  Filled 2017-10-11: qty 1

## 2017-10-11 MED ORDER — HYDRALAZINE HCL 20 MG/ML IJ SOLN
10.0000 mg | Freq: Four times a day (QID) | INTRAMUSCULAR | Status: DC | PRN
  Administered 2017-10-16 – 2017-10-17 (×2): 10 mg via INTRAVENOUS
  Filled 2017-10-11 (×2): qty 1

## 2017-10-11 MED ORDER — LABETALOL HCL 5 MG/ML IV SOLN: 10.0000 mg | INTRAVENOUS | Status: DC | PRN

## 2017-10-11 MED ORDER — CLONIDINE HCL 0.1 MG PO TABS
0.1000 mg | ORAL_TABLET | Freq: Two times a day (BID) | ORAL | Status: DC
  Administered 2017-10-11 – 2017-10-18 (×15): 0.1 mg via ORAL
  Filled 2017-10-11 (×14): qty 1

## 2017-10-11 MED ORDER — ORAL CARE MOUTH RINSE
15.0000 mL | Freq: Two times a day (BID) | OROMUCOSAL | Status: DC
  Administered 2017-10-11 – 2017-10-18 (×7): 15 mL via OROMUCOSAL

## 2017-10-11 MED ORDER — HYDROCODONE-ACETAMINOPHEN 5-325 MG PO TABS
1.0000 | ORAL_TABLET | Freq: Four times a day (QID) | ORAL | Status: DC | PRN
  Administered 2017-10-11 – 2017-10-12 (×3): 1 via ORAL
  Filled 2017-10-11 (×4): qty 1

## 2017-10-11 NOTE — Progress Notes (Signed)
[facility]  ROUNDING NOTE   Subjective:  Patient currently off of BiPAP this a.m. He completed his first dialysis session yesterday successfully. Second dialysis treatment just started. Urine drug screen positive for cocaine, THC.   This could potentially be contributing to his acute renal failure. She is complaining of headache this a.m. Transitioning off nicardipene gtt.   Objective:  Vital signs in last 24 hours:  Temp:  [97.8 F (36.6 C)-99 F (37.2 C)] 97.8 F (36.6 C) (05/10 0741) Pulse Rate:  [69-104] 75 (05/10 0945) Resp:  [9-27] 11 (05/10 0945) BP: (132-210)/(94-158) 158/113 (05/10 0945) SpO2:  [88 %-100 %] 91 % (05/10 0930) Weight:  [77.1 kg (169 lb 15.6 oz)-83.6 kg (184 lb 4.9 oz)] 77.1 kg (169 lb 15.6 oz) (05/09 1823)  Weight change: -4.398 kg (-9 lb 11.1 oz) Filed Weights   10/10/17 1335 10/10/17 1632 10/10/17 1823  Weight: 83.6 kg (184 lb 4.9 oz) 78.1 kg (172 lb 2.9 oz) 77.1 kg (169 lb 15.6 oz)    Intake/Output: I/O last 3 completed shifts: In: 1262.1 [I.V.:262.1; IV [id number]:5643] Out: 3295 [Urine:480; Other:573]   Intake/Output this shift:  Total I/O In: 19.6 [I.V.:19.6] Out: 125 [Urine:125]  Physical Exam: General: No acute distress  Head: Normocephalic, atraumatic. Moist oral mucosal membranes  Eyes: Anicteric  Neck: Supple, trachea midline  Lungs:  Basilar rales, normal effort  Heart: S1S2 no rubs  Abdomen:  Soft, nontender, bowel sounds present  Extremities: Trace peripheral edema.  Neurologic: Awake, alert, following commands  Skin: No lesions       Basic Metabolic Panel: Recent Labs  Lab 10/10/17 0350 10/10/17 1400 10/11/17 0448  NA 140  --  139  K 3.8  --  3.6  CL 109  --  107  CO2 19*  --  19*  GLUCOSE 102*  --  90  BUN 83*  --  70*  CREATININE 13.25*  --  11.09*  CALCIUM 6.9* 7.1* 7.6*  PHOS  --  8.6*  --     Liver Function Tests: No results for input(s): AST, ALT, ALKPHOS, BILITOT, PROT, ALBUMIN in the  last 168 hours. No results for input(s): LIPASE, AMYLASE in the last 168 hours. No results for input(s): AMMONIA in the last 168 hours.  CBC: Recent Labs  Lab 10/10/17 0350 10/11/17 0448  WBC 7.8 7.5  HGB 10.0* 9.5*  HCT 28.3* 27.3*  MCV 95.4 95.3  PLT 198 158    Cardiac Enzymes: Recent Labs  Lab 10/10/17 0350  CKTOTAL 672*  TROPONINI 0.12*    BNP: Invalid input(s): POCBNP  CBG: No results for input(s): GLUCAP in the last 168 hours.  Microbiology: Results for orders placed or performed during the hospital encounter of 10/10/17  MRSA PCR Screening     Status: None   Collection Time: 10/10/17  2:00 PM  Result Value Ref Range Status   MRSA by PCR NEGATIVE NEGATIVE Final    Comment:        The GeneXpert MRSA Assay (FDA approved for NASAL specimens only), is one component of a comprehensive MRSA colonization surveillance program. It is not intended to diagnose MRSA infection nor to guide or monitor treatment for MRSA infections. Performed at [facility], [address] [place], [place] [address]     Coagulation Studies: No results for input(s): LABPROT, INR in the last 72 hours.  Urinalysis: Recent Labs    10/10/17 0850  COLORURINE YELLOW*  LABSPEC 1.011  PHURINE 5.0  GLUCOSEU 50*  HGBUR  LARGE*  BILIRUBINUR NEGATIVE  KETONESUR 5*  PROTEINUR >=300*  NITRITE NEGATIVE  LEUKOCYTESUR NEGATIVE      Imaging: Dg Chest 2 View  Result Date: 10/10/2017 CLINICAL DATA:  32 year old male with chest pain. EXAM: CHEST - 2 VIEW COMPARISON:  None. FINDINGS: There is diffuse interstitial streaky densities and nodularity as well as Kerley B-lines. Findings consistent with interstitial edema. Superimposed pneumonia is not entirely excluded. Clinical correlation is recommended. There are probably trace bilateral pleural effusions. There is no focal consolidation or pneumothorax. The cardiac silhouette is within normal limits. No acute osseous pathology.  IMPRESSION: Interstitial edema. Superimposed pneumonia is not excluded. Clinical correlation is recommended. Electronically Signed   By: Anner Crete M.D.   On: 10/10/2017 05:03   US Renal  Result Date: 10/10/2017 CLINICAL DATA:  Renal failure EXAM: RENAL / URINARY TRACT ULTRASOUND COMPLETE COMPARISON:  CT of the abdomen and pelvis FINDINGS: Right Kidney: Length: 12 cm. Mild increased echogenicity is noted. No hydronephrosis or mass lesion is noted. Left Kidney: Length: 12.3 cm. Mild increased echogenicity is noted without evidence of mass effect. Bladder: Appears normal for degree of bladder distention. IMPRESSION: Mild increased echogenicity bilaterally. No obstructive changes are seen. Electronically Signed   By: Inez Catalina M.D.   On: 10/10/2017 17:31   Dg Chest Port 1 View  Result Date: 10/10/2017 CLINICAL DATA:  Encounter for central line placement. EXAM: PORTABLE CHEST 1 VIEW COMPARISON:  Portable film earlier today. FINDINGS: The heart is enlarged. Perihilar, RIGHT greater than LEFT infiltrates are increased. RIGHT pleural effusion. Dual lumen catheter has been placed from a LEFT IJ approach. Tip lies in the mid to distal SVC. There is no pneumothorax. IMPRESSION: Worsening aeration, particularly on the RIGHT. Dual lumen catheter tip mid to distal SVC. LEFT IJ approach. No pneumothorax. Electronically Signed   By: Staci Righter M.D.   On: 10/10/2017 15:17   Ct Renal Stone Study  Result Date: 10/10/2017 CLINICAL DATA:  Initial evaluation for acute bilateral flank pain. EXAM: CT ABDOMEN AND PELVIS WITHOUT CONTRAST TECHNIQUE: Multidetector CT imaging of the abdomen and pelvis was performed following the standard protocol without IV contrast. COMPARISON:  Prior CT from 01/17/2012. FINDINGS: Lower chest: Layering bilateral pleural effusions. Diffuse interlobular septal thickening at the lung bases consistent with interstitial edema. Superimposed atelectatic changes. Hepatobiliary: Limited  noncontrast evaluation of the liver is unremarkable. Gallbladder within normal limits. No biliary dilatation. Pancreas: Pancreas within normal limits. Spleen: Spleen within normal limits. Adrenals/Urinary Tract: Adrenal glands are normal. Kidneys equal in size without evidence for nephrolithiasis or hydronephrosis. No radiopaque calculi seen along the course of either renal collecting system. No hydroureter. Partially distended bladder within normal limits. No layering stones within the bladder lumen. Stomach/Bowel: Stomach within normal limits. No evidence for bowel obstruction. Appendix is absent. Mild colonic diverticulosis without evidence for acute diverticulitis. No acute inflammatory changes seen about the bowels. Vascular/Lymphatic: Intra-abdominal aorta of normal caliber. No adenopathy. Reproductive: Prostate normal. Other: No free air or fluid. Musculoskeletal: No acute osseous abnormality. No worrisome lytic or blastic osseous lesions. IMPRESSION: 1. No CT evidence for nephrolithiasis or obstructive uropathy. No other acute intra-abdominal or pelvic process. 2. Small moderate layering bilateral pleural effusions with evidence of pulmonary interstitial edema. 3. Mild colonic diverticulosis without evidence for acute diverticulitis. 4. Sequelae of prior appendectomy. Electronically Signed   By: Jeannine Boga M.D.   On: 10/10/2017 07:13     Medications:   . sodium chloride    . sodium chloride    .  niCARDipine 2.5 mg/hr (10/11/17 0700)   . heparin  5,000 Units Subcutaneous Q8H  . tuberculin  5 Units Intradermal Once   sodium chloride, sodium chloride, acetaminophen **OR** acetaminophen, alteplase, bisacodyl, heparin, HYDROmorphone (DILAUDID) injection **OR** HYDROmorphone (DILAUDID) injection, lidocaine (PF), lidocaine-prilocaine, ondansetron **OR** ondansetron (ZOFRAN) IV, pentafluoroprop-tetrafluoroeth, senna-docusate  Assessment/ Plan:  32 y.o. male with a PMHx of GERD, who was  admitted to [facility] on 10/10/2017 for evaluation of chest pain and shortness of breath.  Patient found to have severe metabolic derangements upon admission with a BUN of 83, creatinine 10.2, hemoglobin 10, calcium 6.9.  Urine protein was 300 milligrams per deciliter on spot urinalysis.  Patient had history of taking NSAIDs for prolonged period of time.  Blood pressure also very high upon admission.  Urine drug screen positive for cocaine/THC which may be contributing to ARF. Renal US shows normal sized kidneys.    1.  Acute renal failure/hematuria/proteinuria.     Patient could have an underlying glomerulonephritis.  This could potentially be related to cocaine use and resultant hypertension.  We have initiated hemodialysis and he is receiving a second dialysis treatment today.  We will likely need to consider renal biopsy but we would like to do this once his uremia has been treated properly.  Therefore we will consider renal biopsy sometime early next week.  We will plan for an additional dialysis treatment tomorrow as well.  Serologic work-up ordered yesterday and pending.  2.  Pulmonary edema.    Transitioned off of bipap.  Will plan for UF of 1kg today.   3.  Hypertension.    Patient being weaned off of nicardipine drip at the moment.  We will plan for additional oral antihypertensives including amlodipine and hydralazine.  Avoid beta-blockers for now given cocaine.  4.  Anemia unspecified.  Globin down further.  Hold off on Epogen now given elevated blood pressure.  5.  Secondary hyperparathyroidism.  PTH high at 190 with a phosphorus of 8.6.  Phosphorus should coming down with ongoing dialysis.   LOS: 1 Jeffrey Holloway 5/10/20199:52 AM

## 2017-10-11 NOTE — Progress Notes (Signed)
[facility] NAME: Jeffrey Holloway    MR#:  [id number]  DATE OF BIRTH:  July 13, 1985  SUBJECTIVE:   BP improved since yesterday and off Cardene gtt.  Off Bipap and Resp. Status much improved.  Started on HD and getting HD in ICU when I saw pt.  Tolerating it well.  No acute complaints. Urine drug screen + for Cocaine, Marijuana.   REVIEW OF SYSTEMS:    Review of Systems  Constitutional: Negative for chills and fever.  HENT: Negative for congestion and tinnitus.   Eyes: Negative for blurred vision and double vision.  Respiratory: Negative for cough, shortness of breath and wheezing.   Cardiovascular: Negative for chest pain, orthopnea and PND.  Gastrointestinal: Negative for abdominal pain, diarrhea, nausea and vomiting.  Genitourinary: Negative for dysuria and hematuria.  Neurological: Negative for dizziness, sensory change and focal weakness.  All other systems reviewed and are negative.   Nutrition: Renal/Heart healthy Tolerating Diet: Yes Tolerating PT: Await Eval.    DRUG ALLERGIES:  No Known Allergies  VITALS:  Blood pressure (!) 178/110, pulse 94, temperature 98 F (36.7 C), temperature source Oral, resp. rate 14, height 6' (1.829 m), weight 77.1 kg (169 lb 15.6 oz), SpO2 91 %.  PHYSICAL EXAMINATION:   Physical Exam  GENERAL:  32 y.o.-year-old patient lying in bed in NAD.    EYES: Pupils equal, round, reactive to light and accommodation. No scleral icterus. Extraocular muscles intact.  HEENT: Head atraumatic, normocephalic. Oropharynx and nasopharynx clear.  NECK:  Supple, no jugular venous distention. No thyroid enlargement, no tenderness.  LUNGS: Normal breath sounds bilaterally, no wheezing, rales, rhonchi. No use of accessory muscles of respiration.  CARDIOVASCULAR: S1, S2 normal. No murmurs, rubs, or gallops.  ABDOMEN: Soft, nontender, nondistended. Bowel sounds present. No organomegaly or mass.  EXTREMITIES: No  cyanosis, clubbing or edema b/l.    NEUROLOGIC: Cranial nerves II through XII are intact. No focal Motor or sensory deficits b/l.   PSYCHIATRIC: The patient is alert and oriented x 3.  SKIN: No obvious rash, lesion, or ulcer.    LABORATORY PANEL:   CBC Recent Labs  Lab 10/11/17 0448  WBC 7.5  HGB 9.5*  HCT 27.3*  PLT 158   ------------------------------------------------------------------------------------------------------------------  Chemistries  Recent Labs  Lab 10/11/17 0448  NA 139  K 3.6  CL 107  CO2 19*  GLUCOSE 90  BUN 70*  CREATININE 11.09*  CALCIUM 7.6*   ------------------------------------------------------------------------------------------------------------------  Cardiac Enzymes Recent Labs  Lab 10/10/17 0350  TROPONINI 0.12*   ------------------------------------------------------------------------------------------------------------------  RADIOLOGY:  Dg Chest 2 View  Result Date: 10/10/2017 CLINICAL DATA:  32 year old male with chest pain. EXAM: CHEST - 2 VIEW COMPARISON:  None. FINDINGS: There is diffuse interstitial streaky densities and nodularity as well as Kerley B-lines. Findings consistent with interstitial edema. Superimposed pneumonia is not entirely excluded. Clinical correlation is recommended. There are probably trace bilateral pleural effusions. There is no focal consolidation or pneumothorax. The cardiac silhouette is within normal limits. No acute osseous pathology. IMPRESSION: Interstitial edema. Superimposed pneumonia is not excluded. Clinical correlation is recommended. Electronically Signed   By: Anner Crete M.D.   On: 10/10/2017 05:03   US Renal  Result Date: 10/10/2017 CLINICAL DATA:  Renal failure EXAM: RENAL / URINARY TRACT ULTRASOUND COMPLETE COMPARISON:  CT of the abdomen and pelvis FINDINGS: Right Kidney: Length: 12 cm. Mild increased echogenicity is noted. No hydronephrosis or mass lesion is noted. Left Kidney: Length:  12.3  cm. Mild increased echogenicity is noted without evidence of mass effect. Bladder: Appears normal for degree of bladder distention. IMPRESSION: Mild increased echogenicity bilaterally. No obstructive changes are seen. Electronically Signed   By: Inez Catalina M.D.   On: 10/10/2017 17:31   Dg Chest Port 1 View  Result Date: 10/10/2017 CLINICAL DATA:  Encounter for central line placement. EXAM: PORTABLE CHEST 1 VIEW COMPARISON:  Portable film earlier today. FINDINGS: The heart is enlarged. Perihilar, RIGHT greater than LEFT infiltrates are increased. RIGHT pleural effusion. Dual lumen catheter has been placed from a LEFT IJ approach. Tip lies in the mid to distal SVC. There is no pneumothorax. IMPRESSION: Worsening aeration, particularly on the RIGHT. Dual lumen catheter tip mid to distal SVC. LEFT IJ approach. No pneumothorax. Electronically Signed   By: Staci Righter M.D.   On: 10/10/2017 15:17   Ct Renal Stone Study  Result Date: 10/10/2017 CLINICAL DATA:  Initial evaluation for acute bilateral flank pain. EXAM: CT ABDOMEN AND PELVIS WITHOUT CONTRAST TECHNIQUE: Multidetector CT imaging of the abdomen and pelvis was performed following the standard protocol without IV contrast. COMPARISON:  Prior CT from 01/17/2012. FINDINGS: Lower chest: Layering bilateral pleural effusions. Diffuse interlobular septal thickening at the lung bases consistent with interstitial edema. Superimposed atelectatic changes. Hepatobiliary: Limited noncontrast evaluation of the liver is unremarkable. Gallbladder within normal limits. No biliary dilatation. Pancreas: Pancreas within normal limits. Spleen: Spleen within normal limits. Adrenals/Urinary Tract: Adrenal glands are normal. Kidneys equal in size without evidence for nephrolithiasis or hydronephrosis. No radiopaque calculi seen along the course of either renal collecting system. No hydroureter. Partially distended bladder within normal limits. No layering stones within the  bladder lumen. Stomach/Bowel: Stomach within normal limits. No evidence for bowel obstruction. Appendix is absent. Mild colonic diverticulosis without evidence for acute diverticulitis. No acute inflammatory changes seen about the bowels. Vascular/Lymphatic: Intra-abdominal aorta of normal caliber. No adenopathy. Reproductive: Prostate normal. Other: No free air or fluid. Musculoskeletal: No acute osseous abnormality. No worrisome lytic or blastic osseous lesions. IMPRESSION: 1. No CT evidence for nephrolithiasis or obstructive uropathy. No other acute intra-abdominal or pelvic process. 2. Small moderate layering bilateral pleural effusions with evidence of pulmonary interstitial edema. 3. Mild colonic diverticulosis without evidence for acute diverticulitis. 4. Sequelae of prior appendectomy. Electronically Signed   By: Jeannine Boga M.D.   On: 10/10/2017 07:13     ASSESSMENT AND PLAN:   32 yo male w/ no significant PMH who presented to the hospital due to shortness of breath, chest pain noted to be in acute renal failure with hypertensive urgency.  1.  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  Patient does have hematuria and proteinuria. - This could be secondary to his cocaine abuse and underlying hypertension. -Seen by nephrology and hemodialysis has been initiated.  Continue further care as per nephrology and patient has had dialysis today and yesterday. Serology work-up has been initiated.  Follow-up.  Follow electrolytes which are stable presently.  2.  Acute respiratory failure with hypoxia-secondary to volume overload and pulmonary edema. - Much improved with hemodialysis, off BiPAP now.  Continue O2 supplementation and fluid removal as per dialysis.  3.  Accelerated hypertension- he has no previous history of essential hypertension. -Much improved and patient is off nicardipine drip now.  Continue Norvasc, oral hydralazine.  We will  add some low-dose clonidine, as needed hydralazine and labetalol.  Follow hemodynamics.  4.  Elevated troponin-secondary to supply demand ischemia and poor  renal clearance from renal failure. - No evidence of acute coronary syndrome.  5.  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 - Hemoglobin stable and will continue to monitor.    All the records are reviewed and case discussed with Care Management/Social Worker. Management plans discussed with the patient, family and they are in agreement.  CODE STATUS: Full code  DVT Prophylaxis: Heparin SQ  TOTAL TIME TAKING CARE OF THIS PATIENT: 30 minutes.   POSSIBLE D/C IN 3-4 DAYS, DEPENDING ON CLINICAL CONDITION.   Henreitta Leber M.D on 10/11/2017 at 3:55 PM  Between 7am to 6pm - Pager - [phone]  After 6pm go to [email or web address] - Proofreader  [facility]  Hospitalists  Office  [phone]  CC: Primary care physician; Patient, No Pcp Per

## 2017-10-11 NOTE — Progress Notes (Signed)
Hd completed 

## 2017-10-11 NOTE — Progress Notes (Signed)
Follow up - Critical Care Medicine Note  Patient Details:    Jeffrey Holloway is an 32 y.o. male.with acute renal failure, volume overload, required emergent hemodialysis  Lines, Airways, Drains:    Anti-infectives:  Anti-infectives (From admission, onward)   None      Microbiology: Results for orders placed or performed during the hospital encounter of 10/10/17  MRSA PCR Screening     Status: None   Collection Time: 10/10/17  2:00 PM  Result Value Ref Range Status   MRSA by PCR NEGATIVE NEGATIVE Final    Comment:        The GeneXpert MRSA Assay (FDA approved for NASAL specimens only), is one component of a comprehensive MRSA colonization surveillance program. It is not intended to diagnose MRSA infection nor to guide or monitor treatment for MRSA infections. Performed at [facility], [address], [place], [place] [address]     Studies: Dg Chest 2 View  Result Date: 10/10/2017 CLINICAL DATA:  32 year old male with chest pain. EXAM: CHEST - 2 VIEW COMPARISON:  None. FINDINGS: There is diffuse interstitial streaky densities and nodularity as well as Kerley B-lines. Findings consistent with interstitial edema. Superimposed pneumonia is not entirely excluded. Clinical correlation is recommended. There are probably trace bilateral pleural effusions. There is no focal consolidation or pneumothorax. The cardiac silhouette is within normal limits. No acute osseous pathology. IMPRESSION: Interstitial edema. Superimposed pneumonia is not excluded. Clinical correlation is recommended. Electronically Signed   By: Anner Crete M.D.   On: 10/10/2017 05:03   US Renal  Result Date: 10/10/2017 CLINICAL DATA:  Renal failure EXAM: RENAL / URINARY TRACT ULTRASOUND COMPLETE COMPARISON:  CT of the abdomen and pelvis FINDINGS: Right Kidney: Length: 12 cm. Mild increased echogenicity is noted. No hydronephrosis or mass lesion is noted. Left Kidney: Length: 12.3 cm. Mild increased  echogenicity is noted without evidence of mass effect. Bladder: Appears normal for degree of bladder distention. IMPRESSION: Mild increased echogenicity bilaterally. No obstructive changes are seen. Electronically Signed   By: Inez Catalina M.D.   On: 10/10/2017 17:31   Dg Chest Port 1 View  Result Date: 10/10/2017 CLINICAL DATA:  Encounter for central line placement. EXAM: PORTABLE CHEST 1 VIEW COMPARISON:  Portable film earlier today. FINDINGS: The heart is enlarged. Perihilar, RIGHT greater than LEFT infiltrates are increased. RIGHT pleural effusion. Dual lumen catheter has been placed from a LEFT IJ approach. Tip lies in the mid to distal SVC. There is no pneumothorax. IMPRESSION: Worsening aeration, particularly on the RIGHT. Dual lumen catheter tip mid to distal SVC. LEFT IJ approach. No pneumothorax. Electronically Signed   By: Staci Righter M.D.   On: 10/10/2017 15:17   Ct Renal Stone Study  Result Date: 10/10/2017 CLINICAL DATA:  Initial evaluation for acute bilateral flank pain. EXAM: CT ABDOMEN AND PELVIS WITHOUT CONTRAST TECHNIQUE: Multidetector CT imaging of the abdomen and pelvis was performed following the standard protocol without IV contrast. COMPARISON:  Prior CT from 01/17/2012. FINDINGS: Lower chest: Layering bilateral pleural effusions. Diffuse interlobular septal thickening at the lung bases consistent with interstitial edema. Superimposed atelectatic changes. Hepatobiliary: Limited noncontrast evaluation of the liver is unremarkable. Gallbladder within normal limits. No biliary dilatation. Pancreas: Pancreas within normal limits. Spleen: Spleen within normal limits. Adrenals/Urinary Tract: Adrenal glands are normal. Kidneys equal in size without evidence for nephrolithiasis or hydronephrosis. No radiopaque calculi seen along the course of either renal collecting system. No hydroureter. Partially distended bladder within normal limits. No layering stones within  the bladder lumen.  Stomach/Bowel: Stomach within normal limits. No evidence for bowel obstruction. Appendix is absent. Mild colonic diverticulosis without evidence for acute diverticulitis. No acute inflammatory changes seen about the bowels. Vascular/Lymphatic: Intra-abdominal aorta of normal caliber. No adenopathy. Reproductive: Prostate normal. Other: No free air or fluid. Musculoskeletal: No acute osseous abnormality. No worrisome lytic or blastic osseous lesions. IMPRESSION: 1. No CT evidence for nephrolithiasis or obstructive uropathy. No other acute intra-abdominal or pelvic process. 2. Small moderate layering bilateral pleural effusions with evidence of pulmonary interstitial edema. 3. Mild colonic diverticulosis without evidence for acute diverticulitis. 4. Sequelae of prior appendectomy. Electronically Signed   By: Jeannine Boga M.D.   On: 10/10/2017 07:13    Consults: Treatment Team:  Arta Silence, MD [facility], Ander Gaster, MD Anthonette Legato, MD   Subjective:    Overnight Issues: status post hemodialysis and catheter placement. Presently doing well, awake, alert, on nasal cannula and oriented  Objective:  Vital signs for last 24 hours: Temp:  [97.8 F (36.6 C)-99 F (37.2 C)] 97.8 F (36.6 C) (05/10 0741) Pulse Rate:  [69-104] 99 (05/10 0741) Resp:  [9-27] 13 (05/10 0741) BP: (132-210)/(94-158) 143/94 (05/10 0700) SpO2:  [88 %-100 %] 94 % (05/10 0741) Weight:  [169 lb 15.6 oz (77.1 kg)-184 lb 4.9 oz (83.6 kg)] 169 lb 15.6 oz (77.1 kg) (05/09 1823)  Hemodynamic parameters for last 24 hours:    Intake/Output from previous day: 05/09 0701 - 05/10 0700 In: 262.1 [I.V.:262.1] Out: 1053 [Urine:480]  Intake/Output this shift: Total I/O In: 19.6 [I.V.:19.6] Out: 125 [Urine:125]  Vent settings for last 24 hours:    Physical Exam:  Vital signs: Please see the above listed vital signs HEENT: Left neck catheter, trachea midline, no thyromegaly appreciated, no oral lesions  noted Cardiovascular: Regular rate and rhythm Pulmonary: Clear to auscultation Abdominal: Positive bowel sounds, soft exam Extremities: No clubbing cyanosis or edema noted Neurologic: No focal deficits appreciated  Assessment/Plan:   Acute renal failure.results of workup thus far noted. Importantly patient is positive for opiates, cannabinoids and cocaine metabolites. Also with heavy use of NSAIDs these factors may be the etiology of acute renal failure. Appreciate nephrology's assistance with management.Serology sent, pending results.  Ludivina Guymon 10/11/2017  *Care during the described time interval was provided by me and/or other providers on the critical care team.  I have reviewed this patient's available data, including medical history, events of note, physical examination and test results as part of my evaluation.

## 2017-10-11 NOTE — Progress Notes (Signed)
This note also relates to the following rows which could not be included: Pulse Rate - Cannot attach notes to unvalidated device data Resp - Cannot attach notes to unvalidated device data BP - Cannot attach notes to unvalidated device data SpO2 - Cannot attach notes to unvalidated device data  Hd started  

## 2017-10-11 NOTE — Care Management (Signed)
PPD order in place. Updated RN in ICU to make sure this is included on RN report so that it is read within 72 hours of placement. Estill Bamberg with Patient pathways updated.

## 2017-10-12 LAB — BASIC METABOLIC PANEL
ANION GAP: 10 (ref 5–15)
BUN: 48 mg/dL — ABNORMAL HIGH (ref 6–20)
CO2: 25 mmol/L (ref 22–32)
Calcium: 7.6 mg/dL — ABNORMAL LOW (ref 8.9–10.3)
Chloride: 102 mmol/L (ref 101–111)
Creatinine, Ser: 8.8 mg/dL — ABNORMAL HIGH (ref 0.61–1.24)
GFR, EST AFRICAN AMERICAN: 8 mL/min — AB (ref >60)
GFR, EST NON AFRICAN AMERICAN: 7 mL/min — AB (ref >60)
GLUCOSE: 87 mg/dL (ref 65–99)
POTASSIUM: 3.5 mmol/L (ref 3.5–5.1)
Sodium: 137 mmol/L (ref 135–145)

## 2017-10-12 LAB — UREA NITROGEN, URINE: Urea Nitrogen, Ur: 256 mg/dL

## 2017-10-12 LAB — PHOSPHORUS: Phosphorus: 6.3 mg/dL — ABNORMAL HIGH (ref 2.5–4.6)

## 2017-10-12 MED ORDER — AMLODIPINE BESYLATE 10 MG PO TABS
10.0000 mg | ORAL_TABLET | Freq: Every day | ORAL | Status: DC
  Administered 2017-10-12 – 2017-10-18 (×7): 10 mg via ORAL
  Filled 2017-10-12 (×6): qty 1

## 2017-10-12 MED ORDER — METHYLPREDNISOLONE SODIUM SUCC 1000 MG IJ SOLR
1000.0000 mg | Freq: Every day | INTRAMUSCULAR | Status: AC
  Administered 2017-10-12 – 2017-10-15 (×4): 1000 mg via INTRAVENOUS
  Filled 2017-10-12 (×4): qty 8

## 2017-10-12 MED ORDER — HYDRALAZINE HCL 50 MG PO TABS
100.0000 mg | ORAL_TABLET | Freq: Three times a day (TID) | ORAL | Status: DC
  Administered 2017-10-12 – 2017-10-18 (×18): 100 mg via ORAL
  Filled 2017-10-12 (×18): qty 2

## 2017-10-12 MED ORDER — BUTALBITAL-APAP-CAFFEINE 50-325-40 MG PO TABS
2.0000 | ORAL_TABLET | Freq: Four times a day (QID) | ORAL | Status: DC | PRN
  Administered 2017-10-12 – 2017-10-13 (×3): 2 via ORAL
  Filled 2017-10-12 (×4): qty 2

## 2017-10-12 NOTE — Progress Notes (Signed)
PPD placed left forearm intradermal at 2020. Lot# [id number] Exp: date 09/10/2019. Will read in 48- 72 hours

## 2017-10-12 NOTE — Progress Notes (Signed)
Pre HD assessment    10/12/17 1307  Neurological  Level of Consciousness Alert  Orientation Level Oriented X4  Respiratory  Respiratory Pattern Regular;Unlabored  Chest Assessment Chest expansion symmetrical  Cardiac  ECG Monitor Yes  Vascular  R Radial Pulse +2  L Radial Pulse +2  Edema Generalized  Integumentary  Integumentary (WDL) X  Skin Color Appropriate for ethnicity  Musculoskeletal  Musculoskeletal (WDL) X  Generalized Weakness Yes  Assistive Device None  GU Assessment  Genitourinary (WDL) X  Genitourinary Symptoms  (HD)  Psychosocial  Psychosocial (WDL) WDL

## 2017-10-12 NOTE — Progress Notes (Signed)
[facility]  ROUNDING NOTE   Subjective:  Patient transition to floor care yesterday. He appears to be doing well at the moment. Has not required any further BiPAP. Urine output that was measured was not fully accurate. Tolerating dialysis well thus far. Myeloperoxidase antibody found to be positive.  Objective:  Vital signs in last 24 hours:  Temp:  [98 F (36.7 C)-98.7 F (37.1 C)] 98.7 F (37.1 C) (05/11 0500) Pulse Rate:  [75-94] 78 (05/10 2300) Resp:  [14-20] 18 (05/11 0500) BP: (146-178)/(94-118) 164/94 (05/11 0856) SpO2:  [91 %-99 %] 93 % (05/11 0500)  Weight change:  Filed Weights   10/10/17 1335 10/10/17 1632 10/10/17 1823  Weight: 83.6 kg (184 lb 4.9 oz) 78.1 kg (172 lb 2.9 oz) 77.1 kg (169 lb 15.6 oz)    Intake/Output: I/O last 3 completed shifts: In: 444.6 [P.O.:200; I.V.:244.6] Out: 2725 [Urine:605; Other:1000]   Intake/Output this shift:  No intake/output data recorded.  Physical Exam: General: No acute distress  Head: Normocephalic, atraumatic. Moist oral mucosal membranes  Eyes: Anicteric  Neck: Supple, trachea midline  Lungs:   Clear bilateral, normal effort  Heart: S1S2 no rubs  Abdomen:  Soft, nontender, bowel sounds present  Extremities: Trace peripheral edema.  Neurologic: Awake, alert, following commands  Skin: No lesions       Basic Metabolic Panel: Recent Labs  Lab 10/10/17 0350 10/10/17 1400 10/11/17 0448 10/11/17 0948 10/12/17 0536  NA 140  --  139  --  137  K 3.8  --  3.6  --  3.5  CL 109  --  107  --  102  CO2 19*  --  19*  --  25  GLUCOSE 102*  --  90  --  87  BUN 83*  --  70*  --  48*  CREATININE 13.25*  --  11.09*  --  8.80*  CALCIUM 6.9* 7.1* 7.6*  --  7.6*  PHOS  --  8.6*  --  7.3*  --     Liver Function Tests: No results for input(s): AST, ALT, ALKPHOS, BILITOT, PROT, ALBUMIN in the last 168 hours. No results for input(s): LIPASE, AMYLASE in the last 168 hours. No results for input(s): AMMONIA in  the last 168 hours.  CBC: Recent Labs  Lab 10/10/17 0350 10/11/17 0448  WBC 7.8 7.5  HGB 10.0* 9.5*  HCT 28.3* 27.3*  MCV 95.4 95.3  PLT 198 158    Cardiac Enzymes: Recent Labs  Lab 10/10/17 0350  CKTOTAL 672*  TROPONINI 0.12*    BNP: Invalid input(s): POCBNP  CBG: Recent Labs  Lab 10/10/17 1335  GLUCAP 99    Microbiology: Results for orders placed or performed during the hospital encounter of 10/10/17  MRSA PCR Screening     Status: None   Collection Time: 10/10/17  2:00 PM  Result Value Ref Range Status   MRSA by PCR NEGATIVE NEGATIVE Final    Comment:        The GeneXpert MRSA Assay (FDA approved for NASAL specimens only), is one component of a comprehensive MRSA colonization surveillance program. It is not intended to diagnose MRSA infection nor to guide or monitor treatment for MRSA infections. Performed at [facility], [address] [place], [place] [address]     Coagulation Studies: No results for input(s): LABPROT, INR in the last 72 hours.  Urinalysis: Recent Labs    10/10/17 0850  COLORURINE YELLOW*  LABSPEC 1.011  PHURINE 5.0  GLUCOSEU 50*  [place] 5*  PROTEINUR >=300*  NITRITE NEGATIVE  LEUKOCYTESUR NEGATIVE      Imaging: US Renal  Result Date: 10/10/2017 CLINICAL DATA:  Renal failure EXAM: RENAL / URINARY TRACT ULTRASOUND COMPLETE COMPARISON:  CT of the abdomen and pelvis FINDINGS: Right Kidney: Length: 12 cm. Mild increased echogenicity is noted. No hydronephrosis or mass lesion is noted. Left Kidney: Length: 12.3 cm. Mild increased echogenicity is noted without evidence of mass effect. Bladder: Appears normal for degree of bladder distention. IMPRESSION: Mild increased echogenicity bilaterally. No obstructive changes are seen. Electronically Signed   By: Inez Catalina M.D.   On: 10/10/2017 17:31   Dg Chest Port 1 View  Result Date: 10/10/2017 CLINICAL DATA:  Encounter for  central line placement. EXAM: PORTABLE CHEST 1 VIEW COMPARISON:  Portable film earlier today. FINDINGS: The heart is enlarged. Perihilar, RIGHT greater than LEFT infiltrates are increased. RIGHT pleural effusion. Dual lumen catheter has been placed from a LEFT IJ approach. Tip lies in the mid to distal SVC. There is no pneumothorax. IMPRESSION: Worsening aeration, particularly on the RIGHT. Dual lumen catheter tip mid to distal SVC. LEFT IJ approach. No pneumothorax. Electronically Signed   By: Staci Righter M.D.   On: 10/10/2017 15:17     Medications:   . sodium chloride    . sodium chloride     . amLODipine  10 mg Oral Daily  . cloNIDine  0.1 mg Oral BID  . heparin  5,000 Units Subcutaneous Q8H  . hydrALAZINE  100 mg Oral Q8H  . mouth rinse  15 mL Mouth Rinse BID  . tuberculin  5 Units Intradermal Once   sodium chloride, sodium chloride, acetaminophen **OR** acetaminophen, alteplase, bisacodyl, heparin, hydrALAZINE, HYDROcodone-acetaminophen, labetalol, lidocaine (PF), lidocaine-prilocaine, ondansetron **OR** ondansetron (ZOFRAN) IV, pentafluoroprop-tetrafluoroeth, senna-docusate  Assessment/ Plan:  32 y.o. male with a PMHx of GERD, who was admitted to [facility] on 10/10/2017 for evaluation of chest pain and shortness of breath.  Patient found to have severe metabolic derangements upon admission with a BUN of 83, creatinine 10.2, hemoglobin 10, calcium 6.9.  Urine protein was 300 milligrams per deciliter on spot urinalysis.  Patient had history of taking NSAIDs for prolonged period of time.  Blood pressure also very high upon admission.  Urine drug screen positive for cocaine/THC which may be contributing to ARF. Renal US shows normal sized kidneys.    1.  Acute renal failure/hematuria/proteinuria/myeloperoxidase antibody positive.     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  Myeloperoxidase antibody was quite high.  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  At this point in time we will go ahead and start the patient on Solu-Medrol 1 g IV daily for 3 days.  We will plan for renal biopsy on Monday once his uremia has been fully treated thereby reducing his bleeding risk.  Thereafter we will consider cyclophosphamide versus rituximab.  2.  Pulmonary edema.    Much improved with dialysis.  We will plan for additional dialysis treatment with ultrafiltration today.  3.  Hypertension.    Blood pressure improved off of nicardipine drip.  Continue amlodipine, clonidine, hydralazine for now.  4.  Anemia unspecified.  Hold off on Epogen for now but we may need to consider this if anemia worsens.  5.  Secondary hyperparathyroidism.  Hyperphosphatemia and elevated PTH noted.  Phosphorus should come down with dialysis.  Recheck serum phosphorus today.   LOS: 2 Devlynn Knoff  5/11/201912:35 PM

## 2017-10-12 NOTE — Progress Notes (Signed)
Post HD assessment    10/12/17 1634  Neurological  Level of Consciousness Alert  Orientation Level Oriented X4  Respiratory  Respiratory Pattern Regular;Unlabored  Chest Assessment Chest expansion symmetrical  Cardiac  ECG Monitor Yes  Vascular  R Radial Pulse +2  L Radial Pulse +2  Edema Generalized  Integumentary  Integumentary (WDL) X  Skin Color Appropriate for ethnicity  Musculoskeletal  Musculoskeletal (WDL) X  Generalized Weakness Yes  Assistive Device None  GU Assessment  Genitourinary (WDL) X  Genitourinary Symptoms  (HD)  Psychosocial  Psychosocial (WDL) WDL

## 2017-10-12 NOTE — Progress Notes (Signed)
Post HD assessment. Pt tolerated tx well without c/o or complications. Net UF 1518, goal met.    10/12/17 1630  Vital Signs  Temp 98.5 F (36.9 C)  Temp Source Oral  Pulse Rate 84  Pulse Rate Source Monitor  Resp 20  BP 136/73  BP Location Right Arm  BP Method Automatic  Patient Position (if appropriate) Lying  Oxygen Therapy  SpO2 94 %  O2 Device Room Air  Dialysis Weight  Weight 79 kg (174 lb 2.6 oz)  Type of Weight Post-Dialysis  Post-Hemodialysis Assessment  Rinseback Volume (mL) 250 mL  KECN 53.7 V  Dialyzer Clearance Lightly streaked  Duration of HD Treatment -hour(s) 3 hour(s)  Hemodialysis Intake (mL) 500 mL  UF Total -Machine (mL) 2018 mL  Net UF (mL) 1518 mL  Tolerated HD Treatment Yes  Education / Care Plan  Dialysis Education Provided Yes  Documented Education in Care Plan Yes  Hemodialysis Catheter Left Internal jugular  Placement Date/Time: 10/10/17 1236   Placed prior to admission: No  Orientation: Left  Access Location: Internal jugular  Site Condition No complications  Blue Lumen Status Heparin locked  Red Lumen Status Heparin locked  Purple Lumen Status N/A  Catheter fill solution Heparin 1000 units/ml  Catheter fill volume (Arterial) 1.4 cc  Catheter fill volume (Venous) 1.4  Dressing Type Biopatch  Dressing Status Clean;Dry;Intact  Drainage Description None  Post treatment catheter status Capped and Clamped

## 2017-10-12 NOTE — Progress Notes (Signed)
[facility] [place] NAME: Jeffrey Holloway    MR#:  [id number]  DATE OF BIRTH:  1985/06/18  SUBJECTIVE:   Transferred out of ICU yesterday.  Getting hemodialysis today.  Off the BiPAP.  Urine output is improving slowly.  REVIEW OF SYSTEMS:    Review of Systems  Constitutional: Negative for chills and fever.  HENT: Negative for congestion and tinnitus.   Eyes: Negative for blurred vision and double vision.  Respiratory: Negative for cough, shortness of breath and wheezing.   Cardiovascular: Negative for chest pain, orthopnea and PND.  Gastrointestinal: Negative for abdominal pain, diarrhea, nausea and vomiting.  Genitourinary: Negative for dysuria and hematuria.  Neurological: Negative for dizziness, sensory change and focal weakness.  All other systems reviewed and are negative.   Nutrition: Renal/Heart healthy Tolerating Diet: Yes Tolerating PT: Await Eval.    DRUG ALLERGIES:  No Known Allergies  VITALS:  Blood pressure 135/76, pulse 74, temperature 99.2 F (37.3 C), temperature source Oral, resp. rate 18, height 6' (1.829 m), weight 81.7 kg (180 lb 1.9 oz), SpO2 94 %.  PHYSICAL EXAMINATION:   Physical Exam  GENERAL:  32 y.o.-year-old patient lying in bed in NAD.    EYES: Pupils equal, round, reactive to light and accommodation. No scleral icterus. Extraocular muscles intact.  HEENT: Head atraumatic, normocephalic. Oropharynx and nasopharynx clear.  NECK:  Supple, no jugular venous distention. No thyroid enlargement, no tenderness.  LUNGS: Normal breath sounds bilaterally, no wheezing, rales, rhonchi. No use of accessory muscles of respiration.  CARDIOVASCULAR: S1, S2 normal. No murmurs, rubs, or gallops.  ABDOMEN: Soft, nontender, nondistended. Bowel sounds present. No organomegaly or mass.  EXTREMITIES: No cyanosis, clubbing or edema b/l.    NEUROLOGIC: Cranial nerves II through XII are intact. No focal Motor or sensory  deficits b/l.   PSYCHIATRIC: The patient is alert and oriented x 3.  SKIN: No obvious rash, lesion, or ulcer.    LABORATORY PANEL:   CBC Recent Labs  Lab 10/11/17 0448  WBC 7.5  HGB 9.5*  HCT 27.3*  PLT 158   ------------------------------------------------------------------------------------------------------------------  Chemistries  Recent Labs  Lab 10/12/17 0536  NA 137  K 3.5  CL 102  CO2 25  GLUCOSE 87  BUN 48*  CREATININE 8.80*  CALCIUM 7.6*   ------------------------------------------------------------------------------------------------------------------  Cardiac Enzymes Recent Labs  Lab 10/10/17 0350  TROPONINI 0.12*   ------------------------------------------------------------------------------------------------------------------  RADIOLOGY:  US Renal  Result Date: 10/10/2017 CLINICAL DATA:  Renal failure EXAM: RENAL / URINARY TRACT ULTRASOUND COMPLETE COMPARISON:  CT of the abdomen and pelvis FINDINGS: Right Kidney: Length: 12 cm. Mild increased echogenicity is noted. No hydronephrosis or mass lesion is noted. Left Kidney: Length: 12.3 cm. Mild increased echogenicity is noted without evidence of mass effect. Bladder: Appears normal for degree of bladder distention. IMPRESSION: Mild increased echogenicity bilaterally. No obstructive changes are seen. Electronically Signed   By: Inez Catalina M.D.   On: 10/10/2017 17:31   Dg Chest Port 1 View  Result Date: 10/10/2017 CLINICAL DATA:  Encounter for central line placement. EXAM: PORTABLE CHEST 1 VIEW COMPARISON:  Portable film earlier today. FINDINGS: The heart is enlarged. Perihilar, RIGHT greater than LEFT infiltrates are increased. RIGHT pleural effusion. Dual lumen catheter has been placed from a LEFT IJ approach. Tip lies in the mid to distal SVC. There is no pneumothorax. IMPRESSION: Worsening aeration, particularly on the RIGHT. Dual lumen catheter tip mid to distal SVC. LEFT IJ approach. No pneumothorax.  Electronically  Signed   By: Staci Righter M.D.   On: 10/10/2017 15:17     ASSESSMENT AND PLAN:   32 yo male w/ no significant PMH who presented to the hospital due to shortness of breath, chest pain noted to be in acute renal failure with hypertensive urgency.  1.  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 - This could be secondary to his cocaine abuse and underlying hypertension. -Seen by nephrology and hemodialysis has been initiated.  Continue further care as per nephrology and patient has had dialysis today and yesterday. .  2.  Acute respiratory failure with hypoxia-secondary to volume overload and pulmonary edema. - Much improved with hemodialysis, off BiPAP now.  Continue O2 supplementation and fluid removal as per dialysis.  3.  Accelerated hypertension- he has no previous history of essential hypertension.  Avoid beta-blockers in setting of cocaine. -Much improved and patient is off nicardipine drip now.  Continue Norvasc, oral hydralazine.  We will add some low-dose clonidine,  4.  Elevated troponin-secondary to supply demand ischemia and poor renal clearance from renal failure. - No evidence of acute coronary syndrome.  5.  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 - Hemoglobin stable and will continue to monitor.    All the records are reviewed and case discussed with Care Management/Social Worker. Management plans discussed with the patient, family and they are in agreement.  CODE STATUS: Full code  DVT Prophylaxis: Heparin SQ  TOTAL TIME TAKING CARE OF THIS PATIENT: 30 minutes.   POSSIBLE D/C IN 3-4 DAYS, DEPENDING ON CLINICAL CONDITION.   Epifanio Lesches M.D on 10/12/2017 at 2:13 PM  Between 7am to 6pm - Pager - [phone]  After  6pm go to [email or web address] - Proofreader  [facility]  Office  [phone]  CC: Primary care physician; Patient, No Pcp Per

## 2017-10-12 NOTE — Progress Notes (Signed)
HD tx end   10/12/17 1624  Vital Signs  Pulse Rate 96  Pulse Rate Source Monitor  Resp 15  BP (!) 157/93  BP Location Right Arm  BP Method Automatic  Patient Position (if appropriate) Lying  Oxygen Therapy  SpO2 96 %  O2 Device Room Air  During Hemodialysis Assessment  Dialysis Fluid Bolus Normal Saline  Bolus Amount (mL) 250 mL  Intra-Hemodialysis Comments Tx completed

## 2017-10-12 NOTE — Progress Notes (Signed)
Pre HD assessment   10/12/17 1306  Vital Signs  Temp 99.2 F (37.3 C)  Temp Source Oral  Pulse Rate 83  Pulse Rate Source Monitor  Resp 12  BP (!) 148/99  BP Location Right Arm  BP Method Automatic  Patient Position (if appropriate) Lying  Oxygen Therapy  SpO2 94 %  O2 Device Room Air  Pain Assessment  Pain Scale 0-10  Pain Score 0  Dialysis Weight  Weight 81.7 kg (180 lb 1.9 oz)  Type of Weight Pre-Dialysis  Time-Out for Hemodialysis  What Procedure? HD  Pt Identifiers(min of two) First/Last Name;MRN/Account#  Correct Site? Yes  Correct Side? Yes  Correct Procedure? Yes  Consents Verified? Yes  Rad Studies Available? N/A  Safety Precautions Reviewed? Yes  Engineer, civil (consulting) Number  ([id number])  Station Number 4  UF/Alarm Test Passed  Conductivity: Meter 13.6  Conductivity: Machine  13.6  pH 7.4  Reverse Osmosis main  Normal Saline Lot Number [id number]  Dialyzer Lot Number [id number]  Disposable Set Lot Number [id number]  Machine Temperature 98.6 F (37 C)  Musician and Audible Yes  Blood Lines Intact and Secured Yes  Pre Treatment Patient Checks  Vascular access used during treatment Catheter  Hepatitis B Surface Antigen Results Negative  Date Hepatitis B Surface Antigen Drawn 10/10/17  Hepatitis B Surface Antibody  (>10)  Date Hepatitis B Surface Antibody Drawn 10/10/17  Hemodialysis Consent Verified Yes  Hemodialysis Standing Orders Initiated Yes  ECG (Telemetry) Monitor On Yes  Prime Ordered Normal Saline  Length of  DialysisTreatment -hour(s) 3 Hour(s)  Dialyzer Elisio 17H NR  Dialysate 2K, 2.5 Ca  Dialysis Anticoagulant None  Dialysate Flow Ordered 600  Blood Flow Rate Ordered 300 mL/min  Ultrafiltration Goal 1.5 Liters  Pre Treatment Labs Phosphorus  Dialysis Blood Pressure Support Ordered Normal Saline  Education / Care Plan  Dialysis Education Provided Yes  Documented Education in Care Plan Yes  Hemodialysis Catheter Left Internal jugular   Placement Date/Time: 10/10/17 1236   Placed prior to admission: No  Orientation: Left  Access Location: Internal jugular  Site Condition No complications  Blue Lumen Status Heparin locked  Red Lumen Status Heparin locked  Purple Lumen Status N/A  Dressing Type Biopatch  Dressing Status Clean;Dry;Intact  Drainage Description None

## 2017-10-12 NOTE — Progress Notes (Signed)
HD tx start   10/12/17 1318  Vital Signs  Pulse Rate 84  Pulse Rate Source Monitor  Resp 13  BP 139/88  BP Location Right Arm  BP Method Automatic  Patient Position (if appropriate) Lying  Oxygen Therapy  SpO2 94 %  O2 Device Room Air  During Hemodialysis Assessment  Blood Flow Rate (mL/min) 300 mL/min  Arterial Pressure (mmHg) -90 mmHg  Venous Pressure (mmHg) 80 mmHg  Transmembrane Pressure (mmHg) 70 mmHg  Ultrafiltration Rate (mL/min) 670 mL/min  Dialysate Flow Rate (mL/min) 600 ml/min  Conductivity: Machine  13.6  HD Safety Checks Performed Yes  Dialysis Fluid Bolus Normal Saline  Bolus Amount (mL) 250 mL  Intra-Hemodialysis Comments Tx initiated  Hemodialysis Catheter Left Internal jugular  Placement Date/Time: 10/10/17 1236   Placed prior to admission: No  Orientation: Left  Access Location: Internal jugular  Blue Lumen Status Infusing  Red Lumen Status Infusing

## 2017-10-13 DIAGNOSIS — R079 Chest pain, unspecified: Secondary | ICD-10-CM

## 2017-10-13 LAB — RENAL FUNCTION PANEL
ALBUMIN: 2.9 g/dL — AB (ref 3.5–5.0)
ANION GAP: 13 (ref 5–15)
BUN: 48 mg/dL — AB (ref 6–20)
CO2: 26 mmol/L (ref 22–32)
Calcium: 8.3 mg/dL — ABNORMAL LOW (ref 8.9–10.3)
Chloride: 90 mmol/L — ABNORMAL LOW (ref 101–111)
Creatinine, Ser: 7.67 mg/dL — ABNORMAL HIGH (ref 0.61–1.24)
GFR calc Af Amer: 10 mL/min — ABNORMAL LOW (ref >60)
GFR, EST NON AFRICAN AMERICAN: 8 mL/min — AB (ref >60)
Glucose, Bld: 135 mg/dL — ABNORMAL HIGH (ref 65–99)
PHOSPHORUS: 7.3 mg/dL — AB (ref 2.5–4.6)
POTASSIUM: 3.1 mmol/L — AB (ref 3.5–5.1)
Sodium: 129 mmol/L — ABNORMAL LOW (ref 135–145)

## 2017-10-13 LAB — TROPONIN I: TROPONIN I: 0.08 ng/mL — AB (ref <0.03)

## 2017-10-13 MED ORDER — DOCUSATE SODIUM 100 MG PO CAPS
100.0000 mg | ORAL_CAPSULE | Freq: Two times a day (BID) | ORAL | Status: DC
  Administered 2017-10-13 – 2017-10-18 (×10): 100 mg via ORAL
  Filled 2017-10-13 (×10): qty 1

## 2017-10-13 MED ORDER — FAMOTIDINE IN NACL 20-0.9 MG/50ML-% IV SOLN: 20.0000 mg | Freq: Two times a day (BID) | INTRAVENOUS | Status: DC

## 2017-10-13 MED ORDER — CALCIUM CARBONATE ANTACID 500 MG PO CHEW
1.0000 | CHEWABLE_TABLET | Freq: Once | ORAL | Status: AC
  Administered 2017-10-13: 200 mg via ORAL
  Filled 2017-10-13: qty 1

## 2017-10-13 MED ORDER — PANTOPRAZOLE SODIUM 40 MG PO TBEC
40.0000 mg | DELAYED_RELEASE_TABLET | Freq: Every day | ORAL | Status: DC
  Administered 2017-10-13 – 2017-10-18 (×5): 40 mg via ORAL
  Filled 2017-10-13 (×5): qty 1

## 2017-10-13 MED ORDER — FAMOTIDINE IN NACL 20-0.9 MG/50ML-% IV SOLN
20.0000 mg | Freq: Two times a day (BID) | INTRAVENOUS | Status: DC
  Administered 2017-10-13: 20 mg via INTRAVENOUS
  Filled 2017-10-13: qty 50

## 2017-10-13 MED ORDER — TRAMADOL HCL 50 MG PO TABS
50.0000 mg | ORAL_TABLET | Freq: Once | ORAL | Status: AC
  Administered 2017-10-13: 50 mg via ORAL
  Filled 2017-10-13: qty 1

## 2017-10-13 MED ORDER — ALUM & MAG HYDROXIDE-SIMETH 200-200-20 MG/5ML PO SUSP
30.0000 mL | ORAL | Status: DC | PRN
  Administered 2017-10-13 – 2017-10-15 (×2): 30 mL via ORAL
  Filled 2017-10-13 (×2): qty 30

## 2017-10-13 NOTE — Progress Notes (Signed)
[facility]  ROUNDING NOTE   Subjective:  Urine output remains quite low at 250cc over the preceding 24 hours.  Patient will need renal biopsy tomorrow.  This was discussed in depth with pt.   Objective:  Vital signs in last 24 hours:  Temp:  [97.8 F (36.6 C)-98.5 F (36.9 C)] 97.8 F (36.6 C) (05/12 1323) Pulse Rate:  [69-96] 85 (05/12 1323) Resp:  [11-20] 16 (05/12 1323) BP: (123-157)/(73-107) 151/82 (05/12 1323) SpO2:  [91 %-96 %] 93 % (05/12 1323) Weight:  [79 kg (174 lb 2.6 oz)] 79 kg (174 lb 2.6 oz) (05/11 1630)  Weight change:  Filed Weights   10/10/17 1823 10/12/17 1306 10/12/17 1630  Weight: 77.1 kg (169 lb 15.6 oz) 81.7 kg (180 lb 1.9 oz) 79 kg (174 lb 2.6 oz)    Intake/Output: I/O last 3 completed shifts: In: 170 [P.O.:120; IV Piggyback:50] Out: 7829 [Urine:250; FAOZH:0865]   Intake/Output this shift:  Total I/O In: 320 [P.O.:320] Out: 100 [Urine:100]  Physical Exam: General: No acute distress  Head: Normocephalic, atraumatic. Moist oral mucosal membranes  Eyes: Anicteric  Neck: Supple, trachea midline  Lungs:  Clear bilateral, normal effort  Heart: S1S2 no rubs  Abdomen:  Soft, nontender, bowel sounds present  Extremities: Trace peripheral edema.  Neurologic: Awake, alert, following commands  Skin: No lesions       Basic Metabolic Panel: Recent Labs  Lab 10/10/17 0350 10/10/17 1400 10/11/17 0448 10/11/17 0948 10/12/17 0536 10/12/17 1437  NA 140  --  139  --  137  --   K 3.8  --  3.6  --  3.5  --   CL 109  --  107  --  102  --   CO2 19*  --  19*  --  25  --   GLUCOSE 102*  --  90  --  87  --   BUN 83*  --  70*  --  48*  --   CREATININE 13.25*  --  11.09*  --  8.80*  --   CALCIUM 6.9* 7.1* 7.6*  --  7.6*  --   PHOS  --  8.6*  --  7.3*  --  6.3*    Liver Function Tests: No results for input(s): AST, ALT, ALKPHOS, BILITOT, PROT, ALBUMIN in the last 168 hours. No results for input(s): LIPASE, AMYLASE in the last 168  hours. No results for input(s): AMMONIA in the last 168 hours.  CBC: Recent Labs  Lab 10/10/17 0350 10/11/17 0448  WBC 7.8 7.5  HGB 10.0* 9.5*  HCT 28.3* 27.3*  MCV 95.4 95.3  PLT 198 158    Cardiac Enzymes: Recent Labs  Lab 10/10/17 0350  CKTOTAL 672*  TROPONINI 0.12*    BNP: Invalid input(s): POCBNP  CBG: Recent Labs  Lab 10/10/17 1335  GLUCAP 99    Microbiology: Results for orders placed or performed during the hospital encounter of 10/10/17  MRSA PCR Screening     Status: None   Collection Time: 10/10/17  2:00 PM  Result Value Ref Range Status   MRSA by PCR NEGATIVE NEGATIVE Final    Comment:        The GeneXpert MRSA Assay (FDA approved for NASAL specimens only), is one component of a comprehensive MRSA colonization surveillance program. It is not intended to diagnose MRSA infection nor to guide or monitor treatment for MRSA infections. Performed at [facility], [address], [place], [place] [address]     Coagulation  Studies: No results for input(s): LABPROT, INR in the last 72 hours.  Urinalysis: No results for input(s): COLORURINE, LABSPEC, PHURINE, GLUCOSEU, HGBUR, BILIRUBINUR, KETONESUR, PROTEINUR, UROBILINOGEN, NITRITE, LEUKOCYTESUR in the last 72 hours.  Invalid input(s): APPERANCEUR    Imaging: No results found.   Medications:   . methylPREDNISolone (SOLU-MEDROL) injection Stopped (10/13/17 1035)   . amLODipine  10 mg Oral Daily  . cloNIDine  0.1 mg Oral BID  . docusate sodium  100 mg Oral BID  . heparin  5,000 Units Subcutaneous Q8H  . hydrALAZINE  100 mg Oral Q8H  . mouth rinse  15 mL Mouth Rinse BID  . pantoprazole  40 mg Oral Daily  . tuberculin  5 Units Intradermal Once   acetaminophen **OR** acetaminophen, bisacodyl, butalbital-acetaminophen-caffeine, hydrALAZINE, ondansetron **OR** ondansetron (ZOFRAN) IV, senna-docusate  Assessment/ Plan:  32 y.o. male with a PMHx of GERD, who was admitted to [facility] on  10/10/2017 for evaluation of chest pain and shortness of breath.  Patient found to have severe metabolic derangements upon admission with a BUN of 83, creatinine 10.2, hemoglobin 10, calcium 6.9.  Urine protein was 300 milligrams per deciliter on spot urinalysis.  Patient had history of taking NSAIDs for prolonged period of time.  Blood pressure also very high upon admission.  Urine drug screen positive for cocaine/THC which may be contributing to ARF. Renal US shows normal sized kidneys.    1.  Acute renal failure/hematuria/proteinuria/myeloperoxidase antibody positive.     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  Myeloperoxidase antibody was quite high.  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  -Patient remains relatively oliguric at this time.  We are planning for renal biopsy tomorrow.  He will also need hemodialysis tomorrow.  Additionally once biopsy is complete we are contemplating therapy between rituximab versus cyclophosphamide.  2.  Pulmonary edema.    Significantly improved.  Patient off of supplemental oxygen.  3.  Hypertension.    Blood pressure 151/82 and much improved as compared to admission.  Continue amlodipine, clonidine, hydralazine.  4.  Anemia unspecified.  Continue to hold Epogen for now.  5.  Secondary hyperparathyroidism.  Recheck serum phosphorus with dialysis tomorrow.   LOS: 3 Jeffrey Holloway 5/12/20192:31 PM

## 2017-10-13 NOTE — Progress Notes (Signed)
[place] at [place] NAME: Jeffrey Holloway    MR#:  [id number]  DATE OF BIRTH:  07-08-1985  SUBJECTIVE:complains of headache today, stomach upset because of steroids   Other complaints, patient found kidney biopsy tomorrow, hemodialysis tomorrow.  Met with patient's wife today. REVIEW OF SYSTEMS:    Review of Systems  Constitutional: Negative for chills and fever.  HENT: Negative for congestion and tinnitus.   Eyes: Negative for blurred vision and double vision.  Respiratory: Negative for cough, shortness of breath and wheezing.   Cardiovascular: Negative for chest pain, orthopnea and PND.  Gastrointestinal: Negative for abdominal pain, diarrhea, nausea and vomiting.  Genitourinary: Negative for dysuria and hematuria.  Neurological: Negative for dizziness, sensory change and focal weakness.  All other systems reviewed and are negative.   Nutrition: Renal/Heart healthy Tolerating Diet: Yes Tolerating PT: Await Eval.    DRUG ALLERGIES:  No Known Allergies  VITALS:  Blood pressure (!) 151/82, pulse 85, temperature 97.8 F (36.6 C), temperature source Oral, resp. rate 16, height 6' (1.829 m), weight 79 kg (174 lb 2.6 oz), SpO2 93 %.  PHYSICAL EXAMINATION:   Physical Exam  GENERAL:  32 y.o.-year-old patient lying in bed in NAD.    EYES: Pupils equal, round, reactive to light and accommodation. No scleral icterus. Extraocular muscles intact.  HEENT: Head atraumatic, normocephalic. Oropharynx and nasopharynx clear.  NECK:  Supple, no jugular venous distention. No thyroid enlargement, no tenderness.  LUNGS: Normal breath sounds bilaterally, no wheezing, rales, rhonchi. No use of accessory muscles of respiration.  CARDIOVASCULAR: S1, S2 normal. No murmurs, rubs, or gallops.  ABDOMEN: Soft, nontender, nondistended. Bowel sounds present. No organomegaly or mass.  EXTREMITIES: No cyanosis, clubbing or edema b/l.    NEUROLOGIC: Cranial nerves  II through XII are intact. No focal Motor or sensory deficits b/l.   PSYCHIATRIC: The patient is alert and oriented x 3.  SKIN: No obvious rash, lesion, or ulcer.    LABORATORY PANEL:   CBC Recent Labs  Lab 10/11/17 0448  WBC 7.5  HGB 9.5*  HCT 27.3*  PLT 158   ------------------------------------------------------------------------------------------------------------------  Chemistries  Recent Labs  Lab 10/12/17 0536  NA 137  K 3.5  CL 102  CO2 25  GLUCOSE 87  BUN 48*  CREATININE 8.80*  CALCIUM 7.6*   ------------------------------------------------------------------------------------------------------------------  Cardiac Enzymes Recent Labs  Lab 10/10/17 0350  TROPONINI 0.12*   ------------------------------------------------------------------------------------------------------------------  RADIOLOGY:  No results found.   ASSESSMENT AND PLAN:   31 yo male w/ no significant PMH who presented to the hospital due to shortness of breath, chest pain noted to be in acute renal failure with hypertensive urgency.  1.  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  Patient for hemodialysis, renal biopsy tomorrow.- This could be secondary to his cocaine abuse and underlying hypertension. -Seen by nephrology and hemodialysis has been initiated.  Continue further care as per nephrology .  2.  Acute respiratory failure with hypoxia-secondary to volume overload and pulmonary edema. - Much improved with hemodialysis, off BiPAP now.  Is on room air now.  3.  Accelerated hypertension- he has no previous history of essential hypertension.  Avoid beta-blockers in setting of cocaine. -Much improved and patient is off nicardipine drip now.  Continue Norvasc,  oral hydralazine.  We will add some low-dose clonidine, BP better today. 4.  Elevated troponin-secondary to supply demand ischemia and poor renal clearance from renal failure. - No evidence of acute coronary syndrome.  5.  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 - Hemoglobin stable and will continue to monitor.  #6 headache likely due to migraine: Patient is on Fioricet. For acute gastritis continue PPIs. Constipation use stool softeners as needed added Colace as well.  All the records are reviewed and case discussed with Care Management/Social Worker. Management plans discussed with the patient, family and they are in agreement.  CODE STATUS: Full code  DVT Prophylaxis: Heparin SQ  TOTAL TIME TAKING CARE OF THIS PATIENT: 35 minutes.   POSSIBLE D/C IN 3-4 DAYS, DEPENDING ON CLINICAL CONDITION. More than 50% of time spent in counseling, coordination of care. Epifanio Lesches M.D on 10/13/2017 at 2:23 PM  Between 7am to 6pm - Pager - [phone]  After 6pm go to [email or web address] - Proofreader  [facility]  Office  [phone]  CC: Primary care physician; Patient, No Pcp Per

## 2017-10-14 ENCOUNTER — Encounter: Admit: 2017-10-14 | Discharge: 2017-10-15 | Payer: PRIVATE HEALTH INSURANCE

## 2017-10-14 ENCOUNTER — Inpatient Hospital Stay: Payer: BLUE CROSS/BLUE SHIELD

## 2017-10-14 LAB — RENAL FUNCTION PANEL
ALBUMIN: 2.6 g/dL — AB (ref 3.5–5.0)
ANION GAP: 15 (ref 5–15)
BUN: 59 mg/dL — ABNORMAL HIGH (ref 6–20)
CHLORIDE: 90 mmol/L — AB (ref 101–111)
CO2: 25 mmol/L (ref 22–32)
Calcium: 7.9 mg/dL — ABNORMAL LOW (ref 8.9–10.3)
Creatinine, Ser: 8.84 mg/dL — ABNORMAL HIGH (ref 0.61–1.24)
GFR calc Af Amer: 8 mL/min — ABNORMAL LOW (ref >60)
GFR, EST NON AFRICAN AMERICAN: 7 mL/min — AB (ref >60)
GLUCOSE: 121 mg/dL — AB (ref 65–99)
POTASSIUM: 3.6 mmol/L (ref 3.5–5.1)
Phosphorus: 7.5 mg/dL — ABNORMAL HIGH (ref 2.5–4.6)
Sodium: 130 mmol/L — ABNORMAL LOW (ref 135–145)

## 2017-10-14 LAB — PROTIME-INR
INR: 0.93
INR: 0.95
PROTHROMBIN TIME: 12.6 s (ref 11.4–15.2)
Prothrombin Time: 12.4 seconds (ref 11.4–15.2)

## 2017-10-14 LAB — PROTEIN ELECTROPHORESIS, SERUM
A/G Ratio: 0.8 (ref 0.7–1.7)
A/G Ratio: 0.9 (ref 0.7–1.7)
ALBUMIN ELP: 2.7 g/dL — AB (ref 2.9–4.4)
ALBUMIN ELP: 2.6 g/dL — AB (ref 2.9–4.4)
ALPHA-1-GLOBULIN: 0.4 g/dL (ref 0.0–0.4)
Alpha-1-Globulin: 0.4 g/dL (ref 0.0–0.4)
Alpha-2-Globulin: 0.9 g/dL (ref 0.4–1.0)
Alpha-2-Globulin: 1 g/dL (ref 0.4–1.0)
Beta Globulin: 1 g/dL (ref 0.7–1.3)
Beta Globulin: 1 g/dL (ref 0.7–1.3)
GAMMA GLOBULIN: 0.8 g/dL (ref 0.4–1.8)
GLOBULIN, TOTAL: 3.1 g/dL (ref 2.2–3.9)
Gamma Globulin: 0.8 g/dL (ref 0.4–1.8)
Globulin, Total: 3.1 g/dL (ref 2.2–3.9)
TOTAL PROTEIN ELP: 5.8 g/dL — AB (ref 6.0–8.5)
TOTAL PROTEIN ELP: 5.7 g/dL — AB (ref 6.0–8.5)

## 2017-10-14 LAB — PROTEIN ELECTRO, RANDOM URINE
ALBUMIN ELP UR: 62.5 %
ALPHA-2-GLOBULIN, U: 11.4 %
Alpha-1-Globulin, U: 3.5 %
Beta Globulin, U: 11.2 %
Gamma Globulin, U: 11.5 %
TOTAL PROTEIN, URINE-UPE24: 808.5 mg/dL

## 2017-10-14 LAB — CBC WITH DIFFERENTIAL/PLATELET
Basophils Absolute: 0 10*3/uL (ref 0–0.1)
Basophils Relative: 0 %
EOS ABS: 0 10*3/uL (ref 0–0.7)
EOS PCT: 0 %
HCT: 26.5 % — ABNORMAL LOW (ref 40.0–52.0)
Hemoglobin: 9.3 g/dL — ABNORMAL LOW (ref 13.0–18.0)
LYMPHS ABS: 0.4 10*3/uL — AB (ref 1.0–3.6)
LYMPHS PCT: 4 %
MCH: 33.1 pg (ref 26.0–34.0)
MCHC: 34.9 g/dL (ref 32.0–36.0)
MCV: 94.7 fL (ref 80.0–100.0)
MONO ABS: 0.3 10*3/uL (ref 0.2–1.0)
MONOS PCT: 3 %
Neutro Abs: 11.2 10*3/uL — ABNORMAL HIGH (ref 1.4–6.5)
Neutrophils Relative %: 93 %
PLATELETS: 223 10*3/uL (ref 150–440)
RBC: 2.8 MIL/uL — AB (ref 4.40–5.90)
RDW: 14 % (ref 11.5–14.5)
WBC: 12 10*3/uL — AB (ref 3.8–10.6)

## 2017-10-14 LAB — TROPONIN I
TROPONIN I: 0.06 ng/mL — AB (ref <0.03)
Troponin I: 0.05 ng/mL (ref <0.03)
Troponin I: 0.05 ng/mL (ref <0.03)

## 2017-10-14 LAB — TYPE AND SCREEN
ABO/RH(D): A POS
Antibody Screen: NEGATIVE

## 2017-10-14 LAB — ANCA TITERS
C-ANCA: <1:20 {titer}
P-ANCA: 1:80 {titer} — ABNORMAL HIGH

## 2017-10-14 LAB — PHOSPHORUS: PHOSPHORUS: 8.3 mg/dL — AB (ref 2.5–4.6)

## 2017-10-14 LAB — APTT: aPTT: 31 seconds (ref 24–36)

## 2017-10-14 MED ORDER — FENTANYL CITRATE (PF) 100 MCG/2ML IJ SOLN
INTRAMUSCULAR | Status: AC
  Filled 2017-10-14: qty 4

## 2017-10-14 MED ORDER — HYDROMORPHONE HCL 1 MG/ML IJ SOLN
2.0000 mg | Freq: Four times a day (QID) | INTRAMUSCULAR | Status: DC | PRN
  Administered 2017-10-14 – 2017-10-15 (×2): 2 mg via INTRAVENOUS
  Filled 2017-10-14 (×2): qty 2

## 2017-10-14 MED ORDER — FENTANYL CITRATE (PF) 100 MCG/2ML IJ SOLN
INTRAMUSCULAR | Status: AC | PRN
  Administered 2017-10-14: 50 ug via INTRAVENOUS
  Administered 2017-10-14 (×7): 25 ug via INTRAVENOUS

## 2017-10-14 MED ORDER — SODIUM CHLORIDE 0.9 % IV SOLN
INTRAVENOUS | Status: DC
  Administered 2017-10-14: 09:00:00 via INTRAVENOUS

## 2017-10-14 MED ORDER — NICOTINE 14 MG/24HR TD PT24
14.0000 mg | MEDICATED_PATCH | Freq: Every day | TRANSDERMAL | Status: DC
  Administered 2017-10-14 – 2017-10-18 (×4): 14 mg via TRANSDERMAL
  Filled 2017-10-14 (×5): qty 1

## 2017-10-14 MED ORDER — AMLODIPINE BESYLATE 5 MG PO TABS
ORAL_TABLET | ORAL | Status: AC
  Filled 2017-10-14: qty 2

## 2017-10-14 MED ORDER — MIDAZOLAM HCL 5 MG/5ML IJ SOLN
INTRAMUSCULAR | Status: AC
  Filled 2017-10-14: qty 5

## 2017-10-14 MED ORDER — SODIUM CHLORIDE 0.9 % IV SOLN: INTRAVENOUS | Status: DC

## 2017-10-14 MED ORDER — CALCIUM ACETATE (PHOS BINDER) 667 MG PO CAPS
1334.0000 mg | ORAL_CAPSULE | Freq: Three times a day (TID) | ORAL | Status: DC
  Administered 2017-10-14 – 2017-10-18 (×9): 1334 mg via ORAL
  Filled 2017-10-14 (×11): qty 2

## 2017-10-14 MED ORDER — HYDRALAZINE HCL 20 MG/ML IJ SOLN
INTRAMUSCULAR | Status: AC
  Filled 2017-10-14: qty 1

## 2017-10-14 MED ORDER — CLONIDINE HCL 0.1 MG PO TABS
ORAL_TABLET | ORAL | Status: AC
  Filled 2017-10-14: qty 1

## 2017-10-14 MED ORDER — MIDAZOLAM HCL 5 MG/5ML IJ SOLN
INTRAMUSCULAR | Status: AC | PRN
  Administered 2017-10-14 (×4): 0.5 mg via INTRAVENOUS
  Administered 2017-10-14 (×2): 1 mg via INTRAVENOUS
  Administered 2017-10-14: 0.5 mg via INTRAVENOUS

## 2017-10-14 NOTE — Procedures (Signed)
US guided core biopsy of left kidney lower pole. Total of 3 cores taken, 2 adequate cores obtained.  Minimal blood loss and no immediate complication.

## 2017-10-14 NOTE — Progress Notes (Signed)
HD tx end   10/14/17 1322  Vital Signs  Pulse Rate 80  Pulse Rate Source Monitor  Resp (!) 22  BP (!) 149/90  BP Location Right Arm  BP Method Automatic  Patient Position (if appropriate) Lying  Oxygen Therapy  SpO2 96 %  O2 Device Room Air  During Hemodialysis Assessment  Dialysis Fluid Bolus Normal Saline  Bolus Amount (mL) 250 mL  Intra-Hemodialysis Comments Tx completed

## 2017-10-14 NOTE — Progress Notes (Signed)
Pre HD assessment   10/14/17 0940  Vital Signs  Temp 97.6 F (36.4 C)  Temp Source Oral  Pulse Rate 71  Pulse Rate Source Monitor  Resp 17  BP (!) 153/83  BP Location Right Arm  BP Method Automatic  Patient Position (if appropriate) Lying  Oxygen Therapy  SpO2 95 %  O2 Device Room Air  Pain Assessment  Pain Scale 0-10  Pain Score 0  Dialysis Weight  Weight 79 kg (174 lb 2.6 oz)  Type of Weight Pre-Dialysis  Time-Out for Hemodialysis  What Procedure? HD  Pt Identifiers(min of two) First/Last Name;MRN/Account#  Correct Site? Yes  Correct Side? Yes  Correct Procedure? Yes  Consents Verified? Yes  Rad Studies Available? N/A  Safety Precautions Reviewed? Yes  Engineer, civil (consulting) Number  ([id number])  Station Number 1  UF/Alarm Test Passed  Conductivity: Meter 13.8  Conductivity: Machine  14.1  pH 7.6  Reverse Osmosis main  Normal Saline Lot Number [id number]  Dialyzer Lot Number [id number]  Disposable Set Lot Number [id number]  Machine Temperature 98.6 F (37 C)  Musician and Audible Yes  Blood Lines Intact and Secured Yes  Pre Treatment Patient Checks  Vascular access used during treatment Catheter  Patient is receiving dialysis in a chair Yes  Hepatitis B Surface Antigen Results Negative  Date Hepatitis B Surface Antigen Drawn 10/10/17  Hepatitis B Surface Antibody  (>10)  Date Hepatitis B Surface Antibody Drawn 10/10/17  Hemodialysis Consent Verified Yes  Hemodialysis Standing Orders Initiated Yes  ECG (Telemetry) Monitor On Yes  Prime Ordered Normal Saline  Length of  DialysisTreatment -hour(s) 3.5 Hour(s)  Dialyzer Elisio 17H NR  Dialysate 3K, 2.5 Ca  Dialysis Anticoagulant None  Dialysate Flow Ordered 600  Blood Flow Rate Ordered 400 mL/min  Ultrafiltration Goal 1 Liters  Pre Treatment Labs Renal panel;CBC;Differential;Phosphorus;Type & Screen;PTT  Dialysis Blood Pressure Support Ordered Normal Saline  Education / Care Plan  Dialysis Education Provided  Yes  Documented Education in Care Plan Yes  Hemodialysis Catheter Left Internal jugular  Placement Date/Time: 10/10/17 1236   Placed prior to admission: No  Orientation: Left  Access Location: Internal jugular  Site Condition No complications  Blue Lumen Status Heparin locked  Red Lumen Status Heparin locked  Purple Lumen Status N/A  Dressing Type Biopatch  Dressing Status Clean;Dry;Intact  Drainage Description None

## 2017-10-14 NOTE — Progress Notes (Signed)
Post HD assessment. Pt tolerated tx well without c/o or complications. Net UF 1010, goal met.    10/14/17 1331  Vital Signs  Temp 97.9 F (36.6 C)  Temp Source Oral  Pulse Rate 86  Pulse Rate Source Monitor  Resp 12  BP (!) 141/85  BP Location Right Arm  BP Method Automatic  Patient Position (if appropriate) Lying  Oxygen Therapy  SpO2 94 %  O2 Device Room Air  Dialysis Weight  Weight  (unable to weigh, pt in chair, scale unavailable )  Type of Weight Post-Dialysis  Post-Hemodialysis Assessment  Rinseback Volume (mL) 250 mL  KECN 78.3 V  Dialyzer Clearance Lightly streaked  Duration of HD Treatment -hour(s) 3.5 hour(s)  Hemodialysis Intake (mL) 550 mL  UF Total -Machine (mL) 1560 mL  Net UF (mL) 1010 mL  Tolerated HD Treatment Yes  Education / Care Plan  Dialysis Education Provided Yes  Documented Education in Care Plan Yes  Hemodialysis Catheter Left Internal jugular  Placement Date/Time: 10/10/17 1236   Placed prior to admission: No  Orientation: Left  Access Location: Internal jugular  Site Condition No complications  Blue Lumen Status Heparin locked  Red Lumen Status Heparin locked  Purple Lumen Status N/A  Catheter fill solution Heparin 1000 units/ml  Catheter fill volume (Arterial) 1.4 cc  Catheter fill volume (Venous) 1.4  Dressing Type Biopatch  Dressing Status Clean;Dry;Intact  Drainage Description None  Post treatment catheter status Capped and Clamped

## 2017-10-14 NOTE — Progress Notes (Signed)
[facility]  ROUNDING NOTE   Subjective:   Seen and examined on hemodialysis. Tolerating treatment well. Fourth treatment. LIJ temp HD catheter.     HEMODIALYSIS FLOWSHEET:  Blood Flow Rate (mL/min): 400 mL/min Arterial Pressure (mmHg): -190 mmHg Venous Pressure (mmHg): 150 mmHg Transmembrane Pressure (mmHg): 50 mmHg Ultrafiltration Rate (mL/min): 450 mL/min Dialysate Flow Rate (mL/min): 600 ml/min Conductivity: Machine : 14.2 Conductivity: Machine : 14.2 Dialysis Fluid Bolus: Normal Saline Bolus Amount (mL): 250 mL    Objective:  Vital signs in last 24 hours:  Temp:  [97.6 F (36.4 C)-98.1 F (36.7 C)] 97.9 F (36.6 C) (05/13 1331) Pulse Rate:  [61-99] 86 (05/13 1331) Resp:  [10-25] 12 (05/13 1331) BP: (125-164)/(74-98) 164/98 (05/13 1340) SpO2:  [93 %-96 %] 94 % (05/13 1331) Weight:  [79 kg (174 lb 2.6 oz)] 79 kg (174 lb 2.6 oz) (05/13 0940)  Weight change:  Filed Weights   10/12/17 1306 10/12/17 1630 10/14/17 0940  Weight: 81.7 kg (180 lb 1.9 oz) 79 kg (174 lb 2.6 oz) 79 kg (174 lb 2.6 oz)    Intake/Output: I/O last 3 completed shifts: In: 78 [P.O.:320; IV Piggyback:150] Out: 300 [Urine:300]   Intake/Output this shift:  Total I/O In: -  Out: 1010 [Other:1010]  Physical Exam: General: No acute distress  Head: Normocephalic, atraumatic. Moist oral mucosal membranes  Eyes: Anicteric  Neck: Supple, trachea midline  Lungs:  Clear bilateral, normal effort  Heart: S1S2 no rubs  Abdomen:  Soft, nontender, bowel sounds present  Extremities: Trace peripheral edema.  Neurologic: Awake, alert, following commands  Skin: No lesions  Access:  Left temp HD catheter    Basic Metabolic Panel: Recent Labs  Lab 10/10/17 0350  10/11/17 0448 10/11/17 0948 10/12/17 0536 10/12/17 1437 10/13/17 1822 10/14/17 0444 10/14/17 1006  NA 140  --  139  --  137  --  129* 130*  --   K 3.8  --  3.6  --  3.5  --  3.1* 3.6  --   CL 109  --  107  --  102  --   90* 90*  --   CO2 19*  --  19*  --  25  --  26 25  --   GLUCOSE 102*  --  90  --  87  --  135* 121*  --   BUN 83*  --  70*  --  48*  --  48* 59*  --   CREATININE 13.25*  --  11.09*  --  8.80*  --  7.67* 8.84*  --   CALCIUM 6.9*   < > 7.6*  --  7.6*  --  8.3* 7.9*  --   PHOS  --    < >  --  7.3*  --  6.3* 7.3* 7.5* 8.3*   < > = values in this interval not displayed.    Liver Function Tests: Recent Labs  Lab 10/13/17 1822 10/14/17 0444  ALBUMIN 2.9* 2.6*   No results for input(s): LIPASE, AMYLASE in the last 168 hours. No results for input(s): AMMONIA in the last 168 hours.  CBC: Recent Labs  Lab 10/10/17 0350 10/11/17 0448 10/14/17 0913  WBC 7.8 7.5 12.0*  NEUTROABS  --   --  11.2*  HGB 10.0* 9.5* 9.3*  HCT 28.3* 27.3* 26.5*  MCV 95.4 95.3 94.7  PLT 198 158 223    Cardiac Enzymes: Recent Labs  Lab 10/10/17 0350 10/13/17 1822 10/14/17 0005 10/14/17 0444 10/14/17 1006  CKTOTAL 672*  --   --   --   --   TROPONINI 0.12* 0.08* 0.06* 0.05* 0.05*    BNP: Invalid input(s): POCBNP  CBG: Recent Labs  Lab 10/10/17 1335  GLUCAP 99    Microbiology: Results for orders placed or performed during the hospital encounter of 10/10/17  MRSA PCR Screening     Status: None   Collection Time: 10/10/17  2:00 PM  Result Value Ref Range Status   MRSA by PCR NEGATIVE NEGATIVE Final    Comment:        The GeneXpert MRSA Assay (FDA approved for NASAL specimens only), is one component of a comprehensive MRSA colonization surveillance program. It is not intended to diagnose MRSA infection nor to guide or monitor treatment for MRSA infections. Performed at [facility], [address] [place], [place] [address]     Coagulation Studies: Recent Labs    10/14/17 0913 10/14/17 1006  LABPROT 12.4 12.6  INR 0.93 0.95    Urinalysis: No results for input(s): COLORURINE, LABSPEC, PHURINE, GLUCOSEU, HGBUR, BILIRUBINUR, KETONESUR, PROTEINUR, UROBILINOGEN, NITRITE,  LEUKOCYTESUR in the last 72 hours.  Invalid input(s): APPERANCEUR    Imaging: No results found.   Medications:   . sodium chloride 10 mL/hr at 10/14/17 0900  . sodium chloride    . methylPREDNISolone (SOLU-MEDROL) injection Stopped (10/13/17 1035)   . amLODipine  10 mg Oral Daily  . cloNIDine  0.1 mg Oral BID  . docusate sodium  100 mg Oral BID  . heparin  5,000 Units Subcutaneous Q8H  . hydrALAZINE  100 mg Oral Q8H  . mouth rinse  15 mL Mouth Rinse BID  . nicotine  14 mg Transdermal Daily  . pantoprazole  40 mg Oral Daily  . tuberculin  5 Units Intradermal Once   acetaminophen **OR** acetaminophen, alum & mag hydroxide-simeth, bisacodyl, butalbital-acetaminophen-caffeine, hydrALAZINE, ondansetron **OR** ondansetron (ZOFRAN) IV, senna-docusate  Assessment/ Plan:  Jeffrey Holloway is a 32 y.o. white male with GERD who was admitted to [facility] on 10/10/2017 for evaluation of chest pain and shortness of breath.     1. Acute renal failure with hematuria and proteinuria: requiring hemodialysis Patient with cocaine and NSAID abuse.  Initiated on dialysis on 10/10/17.  Myeloperoxidase positive - Hemodialysis today, Plan on hemodialysis three times a week (TTS). Outpatient planning - Renal biopsy for later today.  - COntinue IV methylprednisone  - Will need IV rituximab versus cyclophosphamide if biopsy positive for vasculitis  3.  Hypertension  - Continue amlodipine, clonidine, hydralazine.  4.  Anemia with renal failure - Hold EPO  5.  Secondary hyperparathyroidism with hyperphosphatemia and hypocalcemia. PTH 188.  - Start calcium acetate - Dietary consult   LOS: 4 Jeffrey Holloway 5/13/20192:07 PM

## 2017-10-14 NOTE — Progress Notes (Signed)
Pre HD assessment    10/14/17 0941  Neurological  Level of Consciousness Alert  Orientation Level Oriented X4  Respiratory  Respiratory Pattern Regular;Unlabored  Chest Assessment Chest expansion symmetrical  Cardiac  ECG Monitor Yes  Cardiac Rhythm NSR  Vascular  R Radial Pulse +2  L Radial Pulse +2  Edema Generalized  Integumentary  Integumentary (WDL) X  Skin Color Appropriate for ethnicity  Musculoskeletal  Musculoskeletal (WDL) X  Generalized Weakness Yes  Assistive Device None  GU Assessment  Genitourinary (WDL) X  Genitourinary Symptoms  (HD)  Psychosocial  Psychosocial (WDL) WDL

## 2017-10-14 NOTE — Progress Notes (Signed)
Post HD assessment    10/14/17 1330  Neurological  Level of Consciousness Alert  Orientation Level Oriented X4  Respiratory  Respiratory Pattern Regular;Unlabored  Chest Assessment Chest expansion symmetrical  Cough Non-productive  Cardiac  ECG Monitor Yes  Cardiac Rhythm NSR  Vascular  R Radial Pulse +2  L Radial Pulse +2  Edema Generalized  Integumentary  Integumentary (WDL) X  Skin Color Appropriate for ethnicity  Musculoskeletal  Musculoskeletal (WDL) X  Generalized Weakness Yes  Assistive Device None  GU Assessment  Genitourinary (WDL) X  Genitourinary Symptoms  (HD)  Psychosocial  Psychosocial (WDL) WDL

## 2017-10-14 NOTE — Progress Notes (Signed)
[facility] NAME: Jeffrey Holloway    MR#:  [id number]  DATE OF BIRTH:  11-Oct-1985  SUBJECTIVE: Patient had a lot of epigastric, midsternal chest discomfort yesterday resolved with Pepcid.  Patient wanted to go out and smoke last night but nurses had to stop him now he is requesting nicotine patch so I ordered a nicotine patch.  BP improved.  Patient for biopsy of the kidney, dialysis today.   Other complaints, patient found kidney biopsy tomorrow, hemodialysis tomorrow.  Jeffrey Holloway Kitchen REVIEW OF SYSTEMS:    Review of Systems  Constitutional: Negative for chills and fever.  HENT: Negative for congestion and tinnitus.   Eyes: Negative for blurred vision and double vision.  Respiratory: Negative for cough, shortness of breath and wheezing.   Cardiovascular: Negative for chest pain, orthopnea and PND.  Gastrointestinal: Negative for abdominal pain, diarrhea, nausea and vomiting.  Genitourinary: Negative for dysuria and hematuria.  Neurological: Negative for dizziness, sensory change and focal weakness.  All other systems reviewed and are negative.   Nutrition: Renal/Heart healthy Tolerating Diet: Yes Tolerating PT: Await Eval.    DRUG ALLERGIES:  No Known Allergies  VITALS:  Blood pressure 131/77, pulse 63, temperature 97.6 F (36.4 C), temperature source Oral, resp. rate 10, height 6' (1.829 m), weight 79 kg (174 lb 2.6 oz), SpO2 95 %.  PHYSICAL EXAMINATION:   Physical Exam  GENERAL:  32 y.o.-year-old patient lying in bed in NAD.    EYES: Pupils equal, round, reactive to light and accommodation. No scleral icterus. Extraocular muscles intact.  HEENT: Head atraumatic, normocephalic. Oropharynx and nasopharynx clear.  NECK:  Supple, no jugular venous distention. No thyroid enlargement, no tenderness.  LUNGS: Normal breath sounds bilaterally, no wheezing, rales, rhonchi. No use of accessory muscles of respiration.  CARDIOVASCULAR: S1, S2  normal. No murmurs, rubs, or gallops.  ABDOMEN: Soft, nontender, nondistended. Bowel sounds present. No organomegaly or mass.  EXTREMITIES: No cyanosis, clubbing or edema b/l.    NEUROLOGIC: Cranial nerves II through XII are intact. No focal Motor or sensory deficits b/l.   PSYCHIATRIC: The patient is alert and oriented x 3.  SKIN: No obvious rash, lesion, or ulcer.    LABORATORY PANEL:   CBC Recent Labs  Lab 10/14/17 0913  WBC 12.0*  HGB 9.3*  HCT 26.5*  PLT 223   ------------------------------------------------------------------------------------------------------------------  Chemistries  Recent Labs  Lab 10/14/17 0444  NA 130*  K 3.6  CL 90*  CO2 25  GLUCOSE 121*  BUN 59*  CREATININE 8.84*  CALCIUM 7.9*   ------------------------------------------------------------------------------------------------------------------  Cardiac Enzymes Recent Labs  Lab 10/14/17 1006  TROPONINI 0.05*   ------------------------------------------------------------------------------------------------------------------  RADIOLOGY:  No results found.   ASSESSMENT AND PLAN:   32 yo male w/ no significant PMH who presented to the hospital due to shortness of breath, chest pain noted to be in acute renal failure with hypertensive urgency.  1.  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  Continue IV PPIs while on high-dose steroids.  Patient for hemodialysis, renal biopsy today.  This could be secondary to his cocaine abuse and underlying hypertension. -Seen by nephrology and hemodialysis has been initiated.  Continue further care as per nephrology . Patient likely need chemotherapy/immunosuppressants for vasculitis.  Patient had a lot of questions about this so  I told Dr. Juleen China to  talk to patient, will likely get a oncology consult. 2.  Acute respiratory failure with hypoxia-secondary to volume overload and pulmonary edema. - Much improved with hemodialysis, off BiPAP now.  Is on room air now.  3.  Accelerated hypertension- he has no previous history of essential hypertension.  Avoid beta-blockers in setting of cocaine. -Much improved and patient is off nicardipine drip now.  Continue Norvasc, oral hydralazine.  We will add some low-dose clonidine, BP better today. 4.  Elevated troponin-secondary to supply demand ischemia and poor renal clearance from renal failure. - No evidence of acute coronary syndrome. Echocardiogram ordered.  Patient had some midsternal chest discomfort yesterday secondary to acute gastritis.  Troponins are trending down from admission. 5.  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 - Hemoglobin stable and will continue to monitor.  #6. headache likely due to migraine: Patient is on Fioricet.  No headache today. For acute gastritis continue PPIs. Constipation use stool softeners as needed added Colace as well.  All the records are reviewed and case discussed with Care Management/Social Worker. Management plans discussed with the patient, family and they are in agreement.  CODE STATUS: Full code  DVT Prophylaxis: Heparin SQ  TOTAL TIME TAKING CARE OF THIS PATIENT: 35 minutes.   POSSIBLE D/C IN 3-4 DAYS, DEPENDING ON CLINICAL CONDITION. More than 50% of time spent in counseling, coordination of care. Jeffrey Holloway M.D on 10/14/2017 at 10:59 AM  Between 7am to 6pm - Pager - [phone]  After 6pm go to [email or web address] - Proofreader  [facility]  Office  [phone]  CC: Primary care physician; Patient, No Pcp Per

## 2017-10-14 NOTE — Progress Notes (Signed)
HD tx start    10/14/17 0946  Vital Signs  Pulse Rate 72  Pulse Rate Source Monitor  Resp 10  BP (!) 150/98  BP Location Right Arm  BP Method Automatic  Patient Position (if appropriate) Lying  Oxygen Therapy  SpO2 93 %  O2 Device Room Air  During Hemodialysis Assessment  Blood Flow Rate (mL/min) 400 mL/min  Arterial Pressure (mmHg) -170 mmHg  Venous Pressure (mmHg) 150 mmHg  Transmembrane Pressure (mmHg) 70 mmHg  Ultrafiltration Rate (mL/min) 440 mL/min  Dialysate Flow Rate (mL/min) 600 ml/min  Conductivity: Machine  14.2  HD Safety Checks Performed Yes  Dialysis Fluid Bolus Normal Saline  Bolus Amount (mL) 250 mL  Intra-Hemodialysis Comments Tx initiated  Hemodialysis Catheter Left Internal jugular  Placement Date/Time: 10/10/17 1236   Placed prior to admission: No  Orientation: Left  Access Location: Internal jugular  Blue Lumen Status Infusing  Red Lumen Status Infusing

## 2017-10-14 NOTE — Consult Note (Signed)
Chief Complaint: Patient was seen in consultation today for acute renal failure and needs renal biopsy.  Referring Physician(s): Holley Raring, [place]  Patient Status: [facility] - In-pt  History of Present Illness: Jeffrey Holloway is a 32 y.o. male who was admitted on 10/10/2017 for chest pain and shortness of breath.  Patient was found to have severe metabolic derangements with a creatinine of 10.2.  Urine drug screen was positive for cocaine/THC.  History of taking NSAIDs for prolonged periods of time.  Patient has a temporary dialysis catheter and received hemodialysis today.  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  Patient is feeling much better since his admission.  Patient has no specific complaints.  Patient's wife noticed that he has new redness around his neck.  History reviewed. No pertinent past medical history.  Past Surgical History:  Procedure Laterality Date  . APPENDECTOMY      Allergies: Patient has no known allergies.  Medications: Prior to Admission medications   Medication Sig Start Date End Date Taking? Authorizing Provider  calcium carbonate (TUMS - DOSED IN MG ELEMENTAL CALCIUM) 500 MG chewable tablet Chew 1 tablet by mouth 2 (two) times daily.   Yes [provider]  ibuprofen (ADVIL,MOTRIN) 200 MG tablet Take 600 mg by mouth every 6 (six) hours as needed for moderate pain.   Yes [provider]  omeprazole (PRILOSEC) 20 MG capsule Take 20 mg by mouth daily.   Yes [provider]  traMADol (ULTRAM) 50 MG tablet Take 1 tablet (50 mg total) by mouth every 6 (six) hours as needed. Patient not taking: Reported on 10/10/2017 01/23/16   Loney Hering, MD     History reviewed. No pertinent family history.  Social History   Socioeconomic History  . Marital status: Married    Spouse name: Not on file  . Number of children: Not on file  . Years of education: Not on file  . Highest education  level: Not on file  Occupational History  . Not on file  Social Needs  . Financial resource strain: Not on file  . Food insecurity:    Worry: Not on file    Inability: Not on file  . Transportation needs:    Medical: Not on file    Non-medical: Not on file  Tobacco Use  . Smoking status: Current Every Day Smoker    Packs/day: 1.00    Types: Cigarettes  . Smokeless tobacco: Never Used  Substance and Sexual Activity  . Alcohol use: Yes  . Drug use: Yes    Types: Marijuana, Cocaine  . Sexual activity: Not on file  Lifestyle  . Physical activity:    Days per week: Not on file    Minutes per session: Not on file  . Stress: Not on file  Relationships  . Social connections:    Talks on phone: Not on file    Gets together: Not on file    Attends religious service: Not on file    Active member of club or organization: Not on file    Attends meetings of clubs or organizations: Not on file    Relationship status: Not on file  Other Topics Concern  . Not on file  Social History Narrative  . Not on file     Review of Systems  Respiratory: Positive for shortness of breath.   Cardiovascular: Positive for chest pain.  Genitourinary: Positive for decreased urine volume.    Vital  Signs: BP (!) 160/99   Pulse 86   Temp 97.9 F (36.6 C) (Oral)   Resp 12   Ht 6' (1.829 m)   Wt 174 lb 2.6 oz (79 kg)   SpO2 94%   BMI 23.62 kg/m   Physical Exam  Constitutional: No distress.  HENT:  Mouth/Throat: Oropharynx is clear and moist.  Temporary dialysis catheter on the left side of the neck.  Mild redness around the neck and lower face.  Findings are nonspecific.  Cardiovascular: Normal rate, regular rhythm and normal heart sounds.  Pulmonary/Chest: Effort normal and breath sounds normal.  Abdominal: Soft. He exhibits no distension. There is no tenderness.    Imaging: Dg Chest 2 View  Result Date: 10/10/2017 CLINICAL DATA:  32 year old male with chest pain. EXAM: CHEST - 2  VIEW COMPARISON:  None. FINDINGS: There is diffuse interstitial streaky densities and nodularity as well as Kerley B-lines. Findings consistent with interstitial edema. Superimposed pneumonia is not entirely excluded. Clinical correlation is recommended. There are probably trace bilateral pleural effusions. There is no focal consolidation or pneumothorax. The cardiac silhouette is within normal limits. No acute osseous pathology. IMPRESSION: Interstitial edema. Superimposed pneumonia is not excluded. Clinical correlation is recommended. Electronically Signed   By: Anner Crete M.D.   On: 10/10/2017 05:03   US Renal  Result Date: 10/10/2017 CLINICAL DATA:  Renal failure EXAM: RENAL / URINARY TRACT ULTRASOUND COMPLETE COMPARISON:  CT of the abdomen and pelvis FINDINGS: Right Kidney: Length: 12 cm. Mild increased echogenicity is noted. No hydronephrosis or mass lesion is noted. Left Kidney: Length: 12.3 cm. Mild increased echogenicity is noted without evidence of mass effect. Bladder: Appears normal for degree of bladder distention. IMPRESSION: Mild increased echogenicity bilaterally. No obstructive changes are seen. Electronically Signed   By: Inez Catalina M.D.   On: 10/10/2017 17:31   Dg Chest Port 1 View  Result Date: 10/10/2017 CLINICAL DATA:  Encounter for central line placement. EXAM: PORTABLE CHEST 1 VIEW COMPARISON:  Portable film earlier today. FINDINGS: The heart is enlarged. Perihilar, RIGHT greater than LEFT infiltrates are increased. RIGHT pleural effusion. Dual lumen catheter has been placed from a LEFT IJ approach. Tip lies in the mid to distal SVC. There is no pneumothorax. IMPRESSION: Worsening aeration, particularly on the RIGHT. Dual lumen catheter tip mid to distal SVC. LEFT IJ approach. No pneumothorax. Electronically Signed   By: Staci Righter M.D.   On: 10/10/2017 15:17   Ct Renal Stone Study  Result Date: 10/10/2017 CLINICAL DATA:  Initial evaluation for acute bilateral flank pain.  EXAM: CT ABDOMEN AND PELVIS WITHOUT CONTRAST TECHNIQUE: Multidetector CT imaging of the abdomen and pelvis was performed following the standard protocol without IV contrast. COMPARISON:  Prior CT from 01/17/2012. FINDINGS: Lower chest: Layering bilateral pleural effusions. Diffuse interlobular septal thickening at the lung bases consistent with interstitial edema. Superimposed atelectatic changes. Hepatobiliary: Limited noncontrast evaluation of the liver is unremarkable. Gallbladder within normal limits. No biliary dilatation. Pancreas: Pancreas within normal limits. Spleen: Spleen within normal limits. Adrenals/Urinary Tract: Adrenal glands are normal. Kidneys equal in size without evidence for nephrolithiasis or hydronephrosis. No radiopaque calculi seen along the course of either renal collecting system. No hydroureter. Partially distended bladder within normal limits. No layering stones within the bladder lumen. Stomach/Bowel: Stomach within normal limits. No evidence for bowel obstruction. Appendix is absent. Mild colonic diverticulosis without evidence for acute diverticulitis. No acute inflammatory changes seen about the bowels. Vascular/Lymphatic: Intra-abdominal aorta of normal caliber. No adenopathy. Reproductive:  Prostate normal. Other: No free air or fluid. Musculoskeletal: No acute osseous abnormality. No worrisome lytic or blastic osseous lesions. IMPRESSION: 1. No CT evidence for nephrolithiasis or obstructive uropathy. No other acute intra-abdominal or pelvic process. 2. Small moderate layering bilateral pleural effusions with evidence of pulmonary interstitial edema. 3. Mild colonic diverticulosis without evidence for acute diverticulitis. 4. Sequelae of prior appendectomy. Electronically Signed   By: Jeannine Boga M.D.   On: 10/10/2017 07:13    Labs:  CBC: Recent Labs    10/10/17 0350 10/11/17 0448 10/14/17 0913  WBC 7.8 7.5 12.0*  HGB 10.0* 9.5* 9.3*  HCT 28.3* 27.3* 26.5*    PLT 198 158 223    COAGS: Recent Labs    10/14/17 0913 10/14/17 1006  INR 0.93 0.95  APTT  --  31    BMP: Recent Labs    10/11/17 0448 10/12/17 0536 10/13/17 1822 10/14/17 0444  NA 139 137 129* 130*  K 3.6 3.5 3.1* 3.6  CL 107 102 90* 90*  CO2 19* 25 26 25   GLUCOSE 90 87 135* 121*  BUN 70* 48* 48* 59*  CALCIUM 7.6* 7.6* 8.3* 7.9*  CREATININE 11.09* 8.80* 7.67* 8.84*  GFRNONAA 5* 7* 8* 7*  GFRAA 6* 8* 10* 8*    LIVER FUNCTION TESTS: Recent Labs    10/13/17 1822 10/14/17 0444  ALBUMIN 2.9* 2.6*    TUMOR MARKERS: No results for input(s): AFPTM, CEA, CA199, CHROMGRNA in the last 8760 hours.  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  Request for image guided renal biopsy.  Patient just finished hemodialysis without heparin.  Prior imaging showed no evidence for hydronephrosis.  Discussed ultrasound-guided random renal biopsy with the patient.  He understands that the greatest risk is bleeding particularly with an elevated blood pressure.  We will give the patient his scheduled antihypertensive medicines prior to the procedure.  Patient has a good understanding of the procedure and informed consent was obtained.  Plan for ultrasound-guided random renal biopsy with moderate sedation.  Thank you for this interesting consult.  I greatly enjoyed meeting Tyreck L Vaca and look forward to participating in their care.  A copy of this report was sent to the requesting provider on this date.  Electronically Signed: Burman Riis, MD 10/14/2017, 2:44 PM   I spent a total of 20 Minutes    in face to face in clinical consultation, greater than 50% of which was counseling/coordinating care for image guided renal biopsy.

## 2017-10-15 ENCOUNTER — Encounter
Admission: EM | Disposition: A | Discharge status: Home / Self Care | Payer: Self-pay | Source: Home / Self Care | Attending: Internal Medicine

## 2017-10-15 ENCOUNTER — Inpatient Hospital Stay
Admit: 2017-10-15 | Discharge: 2017-10-15 | Disposition: A | Discharge status: Home / Self Care | Payer: BLUE CROSS/BLUE SHIELD | Attending: Internal Medicine | Admitting: Internal Medicine

## 2017-10-15 DIAGNOSIS — N189 Chronic kidney disease, unspecified: Secondary | ICD-10-CM

## 2017-10-15 DIAGNOSIS — I10 Essential (primary) hypertension: Secondary | ICD-10-CM

## 2017-10-15 DIAGNOSIS — R0602 Shortness of breath: Secondary | ICD-10-CM

## 2017-10-15 HISTORY — PX: DIALYSIS/PERMA CATHETER INSERTION: CATH118288

## 2017-10-15 LAB — ECHOCARDIOGRAM COMPLETE
HEIGHTINCHES: 72 in
Weight: 2786.61 oz

## 2017-10-15 LAB — CBC
HCT: 28.4 % — ABNORMAL LOW (ref 40.0–52.0)
Hemoglobin: 9.8 g/dL — ABNORMAL LOW (ref 13.0–18.0)
MCH: 33 pg (ref 26.0–34.0)
MCHC: 34.4 g/dL (ref 32.0–36.0)
MCV: 96 fL (ref 80.0–100.0)
Platelets: 240 10*3/uL (ref 150–440)
RBC: 2.96 MIL/uL — ABNORMAL LOW (ref 4.40–5.90)
RDW: 13.8 % (ref 11.5–14.5)
WBC: 13.3 10*3/uL — AB (ref 3.8–10.6)

## 2017-10-15 LAB — RENAL FUNCTION PANEL
ALBUMIN: 3.1 g/dL — AB (ref 3.5–5.0)
ANION GAP: 10 (ref 5–15)
BUN: 53 mg/dL — AB (ref 6–20)
CO2: 28 mmol/L (ref 22–32)
Calcium: 8.1 mg/dL — ABNORMAL LOW (ref 8.9–10.3)
Chloride: 94 mmol/L — ABNORMAL LOW (ref 101–111)
Creatinine, Ser: 6.67 mg/dL — ABNORMAL HIGH (ref 0.61–1.24)
GFR calc Af Amer: 11 mL/min — ABNORMAL LOW (ref >60)
GFR, EST NON AFRICAN AMERICAN: 10 mL/min — AB (ref >60)
Glucose, Bld: 183 mg/dL — ABNORMAL HIGH (ref 65–99)
Phosphorus: 4.6 mg/dL (ref 2.5–4.6)
Potassium: 3.9 mmol/L (ref 3.5–5.1)
Sodium: 132 mmol/L — ABNORMAL LOW (ref 135–145)

## 2017-10-15 SURGERY — DIALYSIS/PERMA CATHETER INSERTION
Anesthesia: Moderate Sedation

## 2017-10-15 MED ORDER — LIDOCAINE-EPINEPHRINE (PF) 1 %-1:200000 IJ SOLN
INTRAMUSCULAR | Status: AC
  Filled 2017-10-15: qty 30

## 2017-10-15 MED ORDER — FENTANYL CITRATE (PF) 100 MCG/2ML IJ SOLN
INTRAMUSCULAR | Status: AC
  Filled 2017-10-15: qty 2

## 2017-10-15 MED ORDER — CEFAZOLIN SODIUM-DEXTROSE 1-4 GM/50ML-% IV SOLN
1.0000 g | Freq: Once | INTRAVENOUS | Status: AC
  Administered 2017-10-15: 1 g via INTRAVENOUS

## 2017-10-15 MED ORDER — MIDAZOLAM HCL 2 MG/2ML IJ SOLN
INTRAMUSCULAR | Status: DC | PRN
  Administered 2017-10-15 (×2): 2 mg via INTRAVENOUS
  Administered 2017-10-15: 1 mg via INTRAVENOUS

## 2017-10-15 MED ORDER — HEPARIN (PORCINE) IN NACL 1000-0.9 UT/500ML-% IV SOLN
INTRAVENOUS | Status: AC
  Filled 2017-10-15: qty 500

## 2017-10-15 MED ORDER — HYDROCODONE-ACETAMINOPHEN 5-325 MG PO TABS: 1.0000 | ORAL_TABLET | Freq: Four times a day (QID) | ORAL | Status: DC | PRN

## 2017-10-15 MED ORDER — HYDROMORPHONE HCL 1 MG/ML IJ SOLN
2.0000 mg | Freq: Four times a day (QID) | INTRAMUSCULAR | Status: DC | PRN
  Administered 2017-10-15 – 2017-10-16 (×4): 2 mg via INTRAVENOUS
  Filled 2017-10-15 (×4): qty 2

## 2017-10-15 MED ORDER — SODIUM CHLORIDE 0.9 % IV SOLN: INTRAVENOUS | Status: DC

## 2017-10-15 MED ORDER — NEPRO/CARBSTEADY PO LIQD
237.0000 mL | Freq: Two times a day (BID) | ORAL | Status: DC
  Administered 2017-10-16 – 2017-10-18 (×5): 237 mL via ORAL

## 2017-10-15 MED ORDER — MIDAZOLAM HCL 5 MG/5ML IJ SOLN
INTRAMUSCULAR | Status: AC
  Filled 2017-10-15: qty 5

## 2017-10-15 MED ORDER — FENTANYL CITRATE (PF) 100 MCG/2ML IJ SOLN
INTRAMUSCULAR | Status: DC | PRN
  Administered 2017-10-15: 25 ug via INTRAVENOUS
  Administered 2017-10-15: 50 ug via INTRAVENOUS
  Administered 2017-10-15: 25 ug via INTRAVENOUS

## 2017-10-15 MED ORDER — HEPARIN SODIUM (PORCINE) 10000 UNIT/ML IJ SOLN
INTRAMUSCULAR | Status: AC
  Filled 2017-10-15: qty 1

## 2017-10-15 MED ORDER — PREDNISONE 50 MG PO TABS
80.0000 mg | ORAL_TABLET | Freq: Every day | ORAL | Status: DC
  Administered 2017-10-16 – 2017-10-18 (×3): 80 mg via ORAL
  Filled 2017-10-15: qty 1
  Filled 2017-10-15: qty 4
  Filled 2017-10-15: qty 1

## 2017-10-15 MED ORDER — RENA-VITE PO TABS
1.0000 | ORAL_TABLET | Freq: Every day | ORAL | Status: DC
  Administered 2017-10-15 – 2017-10-17 (×3): 1 via ORAL
  Filled 2017-10-15 (×4): qty 1

## 2017-10-15 SURGICAL SUPPLY — 11 items
BIOPATCH RED 1 DISK 7.0 (GAUZE/BANDAGES/DRESSINGS) ×2 IMPLANT
CATH PALINDROME RT-P 15FX19CM (CATHETERS) ×2 IMPLANT
COVER PROBE U/S 5X48 (MISCELLANEOUS) ×2 IMPLANT
DERMABOND ADVANCED (GAUZE/BANDAGES/DRESSINGS) ×1
DERMABOND ADVANCED .7 DNX12 (GAUZE/BANDAGES/DRESSINGS) ×1 IMPLANT
NEEDLE ENTRY 21GA 7CM ECHOTIP (NEEDLE) ×2 IMPLANT
PACK ANGIOGRAPHY (CUSTOM PROCEDURE TRAY) ×2 IMPLANT
SET INTRO CAPELLA COAXIAL (SET/KITS/TRAYS/PACK) ×2 IMPLANT
SUT MNCRL AB 4-0 PS2 18 (SUTURE) ×2 IMPLANT
SUT SILK 0 FSL (SUTURE) ×2 IMPLANT
TOWEL OR 17X26 4PK STRL BLUE (TOWEL DISPOSABLE) ×2 IMPLANT

## 2017-10-15 NOTE — Op Note (Signed)
OPERATIVE NOTE   PROCEDURE: 1. Insertion of tunneled dialysis catheter right IJ approach with ultrasound and fluoroscopic guidance.  PRE-OPERATIVE DIAGNOSIS: Acute on chronic renal failure  POST-OPERATIVE DIAGNOSIS: Same  SURGEON: Hortencia Pilar.  ANESTHESIA: Conscious sedation was administered under my direct supervision by the interventional radiology RN. IV Versed plus fentanyl were utilized. Continuous ECG, pulse oximetry and blood pressure was monitored throughout the entire procedure. Conscious sedation was for a total of 26 minutes.  ESTIMATED BLOOD LOSS: Minimal cc  CONTRAST USED:  None  FLUOROSCOPY TIME: 0.6 minutes  INDICATIONS:   Jeffrey L Mooreis a 32 y.o. y.o. male who presents with acute on chronic renal failure.  He has been initiated on dialysis and now requires access that he can be discharged from the hospital with.  Risks and benefits are reviewed patient has agreed to proceed.  DESCRIPTION: After obtaining full informed written consent, the patient was positioned supine. The right neck and chest wall was prepped and draped in a sterile fashion. Ultrasound was placed in a sterile sleeve. Ultrasound was utilized to identify the right internal jugular vein which is noted to be echolucent and compressible indicating patency. Image is recorded for the permanent record. Under direct ultrasound visualization a micro-needle is inserted into the vein followed by the micro-wire. Micro-sheath was then advanced and a J wire is inserted without difficulty under fluoroscopic guidance. Small counterincision was made at the wire insertion site. Dilators are passed over the wire and the tunneled dialysis catheter is fed into the central venous system without difficulty.  Under fluoroscopy the catheter tip positioned at the atrial caval junction. The catheter is then approximated to the chest wall and an exit site selected. 1% lidocaine is infiltrated in soft tissues at this level small  incision is made and the tunneling device is then passed from the exit site to the neck counterincision. Catheter is then connected to the tunneling device and the catheter was pulled subcutaneously. It is then transected and the hub assembly connected without difficulty. Both lumens aspirate and flush easily. After verification of smooth contour with proper tip position under fluoroscopy the catheter is packed with 5000 units of heparin per lumen.  Catheter secured to the skin of the right chest wall with 0 silk. A sterile dressing is applied with a Biopatch.  COMPLICATIONS: None  CONDITION: Good  Hortencia Pilar [place] [facility]. Office:  [phone]   10/15/2017,4:49 PM

## 2017-10-15 NOTE — Progress Notes (Signed)
[facility]  ROUNDING NOTE   Subjective:   Kidney biopsy yesterday. Tolerated treatment well.   Hemodialysis treatment yesterday. Tolerated treatment well.    Objective:  Vital signs in last 24 hours:  Temp:  [97.6 F (36.4 C)-97.9 F (36.6 C)] 97.6 F (36.4 C) (05/14 1257) Pulse Rate:  [70-94] 80 (05/14 1257) Resp:  [10-20] 18 (05/14 1257) BP: (131-157)/(79-99) 155/95 (05/14 1257) SpO2:  [94 %-100 %] 94 % (05/14 1257)  Weight change:  Filed Weights   10/12/17 1306 10/12/17 1630 10/14/17 0940  Weight: 81.7 kg (180 lb 1.9 oz) 79 kg (174 lb 2.6 oz) 79 kg (174 lb 2.6 oz)    Intake/Output: I/O last 3 completed shifts: In: 240 [P.O.:240] Out: 1360 [Urine:350; Other:1010]   Intake/Output this shift:  No intake/output data recorded.  Physical Exam: General: No acute distress  Head: Normocephalic, atraumatic. Moist oral mucosal membranes  Eyes: Anicteric  Neck: Supple, trachea midline  Lungs:  Clear bilateral, normal effort  Heart: S1S2 no rubs  Abdomen:  Soft, nontender, bowel sounds present  Extremities: No peripheral edema.  Neurologic: Awake, alert, following commands  Skin: No lesions  Access:  Left temp HD catheter    Basic Metabolic Panel: Recent Labs  Lab 10/11/17 0448  10/12/17 0536 10/12/17 1437 10/13/17 1822 10/14/17 0444 10/14/17 1006 10/15/17 1046  NA 139  --  137  --  129* 130*  --  132*  K 3.6  --  3.5  --  3.1* 3.6  --  3.9  CL 107  --  102  --  90* 90*  --  94*  CO2 19*  --  25  --  26 25  --  28  GLUCOSE 90  --  87  --  135* 121*  --  183*  BUN 70*  --  48*  --  48* 59*  --  53*  CREATININE 11.09*  --  8.80*  --  7.67* 8.84*  --  6.67*  CALCIUM 7.6*  --  7.6*  --  8.3* 7.9*  --  8.1*  PHOS  --    < >  --  6.3* 7.3* 7.5* 8.3* 4.6   < > = values in this interval not displayed.    Liver Function Tests: Recent Labs  Lab 10/13/17 1822 10/14/17 0444 10/15/17 1046  ALBUMIN 2.9* 2.6* 3.1*   No results for input(s): LIPASE,  AMYLASE in the last 168 hours. No results for input(s): AMMONIA in the last 168 hours.  CBC: Recent Labs  Lab 10/10/17 0350 10/11/17 0448 10/14/17 0913 10/15/17 1046  WBC 7.8 7.5 12.0* 13.3*  NEUTROABS  --   --  11.2*  --   HGB 10.0* 9.5* 9.3* 9.8*  HCT 28.3* 27.3* 26.5* 28.4*  MCV 95.4 95.3 94.7 96.0  PLT 198 158 223 240    Cardiac Enzymes: Recent Labs  Lab 10/10/17 0350 10/13/17 1822 10/14/17 0005 10/14/17 0444 10/14/17 1006  CKTOTAL 672*  --   --   --   --   TROPONINI 0.12* 0.08* 0.06* 0.05* 0.05*    BNP: Invalid input(s): POCBNP  CBG: Recent Labs  Lab 10/10/17 1335  GLUCAP 99    Microbiology: Results for orders placed or performed during the hospital encounter of 10/10/17  MRSA PCR Screening     Status: None   Collection Time: 10/10/17  2:00 PM  Result Value Ref Range Status   MRSA by PCR NEGATIVE NEGATIVE Final    Comment:  The GeneXpert MRSA Assay (FDA approved for NASAL specimens only), is one component of a comprehensive MRSA colonization surveillance program. It is not intended to diagnose MRSA infection nor to guide or monitor treatment for MRSA infections. Performed at [facility], [address] [employer] [place] [employer], [place] [address]     Coagulation Studies: Recent Labs    10/14/17 0913 10/14/17 1006  LABPROT 12.4 12.6  INR 0.93 0.95    Urinalysis: No results for input(s): COLORURINE, LABSPEC, PHURINE, GLUCOSEU, HGBUR, BILIRUBINUR, KETONESUR, PROTEINUR, UROBILINOGEN, NITRITE, LEUKOCYTESUR in the last 72 hours.  Invalid input(s): APPERANCEUR    Imaging: US Biopsy (kidney)  Result Date: 10/14/2017 INDICATION: 32 year old with acute renal failure and on hemodialysis. 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 Renal biopsy has been requested. EXAM: ULTRASOUND-GUIDED RANDOM LEFT RENAL BIOPSY MEDICATIONS: None. ANESTHESIA/SEDATION: Moderate (conscious) sedation was employed during this procedure. A total of Versed 4.5 mg and Fentanyl 200  mcg was administered intravenously. Moderate Sedation Time: 29 minutes. The patient's level of consciousness and vital signs were monitored continuously by radiology nursing throughout the procedure under my direct supervision. FLUOROSCOPY TIME:  None COMPLICATIONS: None immediate. PROCEDURE: Informed written consent was obtained from the patient after a thorough discussion of the procedural risks, benefits and alternatives. All questions were addressed. A timeout was performed prior to the initiation of the procedure. Patient was placed prone. Both kidneys were evaluated with ultrasound. The left kidney was selected for biopsy. The left flank was prepped with chlorhexidine and a sterile field was created. Skin and soft tissues were anesthetized with 1% lidocaine. Coaxial needle was directed towards the left kidney lower pole with ultrasound guidance. 22 gauge core needle was then used to biopsy the left kidney lower pole. The first core biopsy had scant material. Therefore, 2 additional ultrasound-guided core biopsies were obtained with the 16 gauge device. The second and third core biopsies were adequate and placed on a Telfa pad with saline. The coaxial needle was removed. Bandage placed over the puncture site. FINDINGS: Increased echogenicity in the kidneys. No hydronephrosis. Core biopsies obtained from the left kidney lower pole. No significant bleeding or hematoma formation following the core biopsies. IMPRESSION: Ultrasound-guided core biopsies of the left kidney lower pole. Electronically Signed   By: Markus Daft M.D.   On: 10/14/2017 16:48     Medications:   . sodium chloride 10 mL/hr at 10/14/17 0900  . sodium chloride     . amLODipine  10 mg Oral Daily  . calcium acetate  1,334 mg Oral TID WC  . cloNIDine  0.1 mg Oral BID  . docusate sodium  100 mg Oral BID  . feeding supplement (NEPRO CARB STEADY)  237 mL Oral BID BM  . heparin  5,000 Units Subcutaneous Q8H  . hydrALAZINE  100 mg Oral Q8H   . mouth rinse  15 mL Mouth Rinse BID  . multivitamin  1 tablet Oral QHS  . nicotine  14 mg Transdermal Daily  . pantoprazole  40 mg Oral Daily  . [START ON 10/16/2017] predniSONE  80 mg Oral Q breakfast   acetaminophen **OR** acetaminophen, alum & mag hydroxide-simeth, bisacodyl, hydrALAZINE, HYDROmorphone (DILAUDID) injection, ondansetron **OR** ondansetron (ZOFRAN) IV, senna-docusate  Assessment/ Plan:  [name] is a 31 y.o. white male with GERD who was admitted to [facility] on 10/10/2017 for evaluation of chest pain and shortness of breath.     1. Acute renal failure with hematuria and proteinuria: requiring hemodialysis Patient with cocaine and NSAID abuse.  Initiated on  dialysis on 10/10/17.  Myeloperoxidase positive. Renal biopsy on 5/13. Pending results - Hemodialysis on MWF schedule - Continue IV methylprednisone today and then transition to PO prednisone 80mg  PO daily - Will need IV rituximab versus cyclophosphamide if biopsy positive for vasculitis. Discussed with patient. May give IV cyclophosphamide before discharge.  - tunneled catheter for later today. Appreciate vascular input.   3.  Hypertension  - Continue amlodipine, clonidine, hydralazine.  4.  Anemia with renal failure: hemoglobin 9.8 - EPO for next HD treatment.   5.  Secondary hyperparathyroidism with hyperphosphatemia and hypocalcemia. PTH 188. Dietary worked with patient today about renal failure diet.  - Started calcium acetate   LOS: 5 Nayana Lenig 5/14/20192:41 PM

## 2017-10-15 NOTE — Progress Notes (Signed)
   10/15/17 0606  PPD Results  Does patient have an induration at the injection site? No  Induration(mm) 0 mm  Name of Physician Notified left note for doc

## 2017-10-15 NOTE — Care Management (Signed)
Per Elvera Bicker HD liaison patient's out patient schedule is confirmed and as follows. MWF 4:30 pm for 2 weeks, then move to TTS 12:15 as his permanent schedule.  [place]

## 2017-10-15 NOTE — Consult Note (Signed)
Cardiology Consultation Note    Patient ID: Jeffrey Holloway, MRN: [id number], DOB/AGE: [birth year] 35 y.o. Admit date: 10/10/2017   Date of Consult: 10/15/2017 Primary Physician: Patient, No Pcp Per Primary Cardiologist: none  Chief Complaint: chest pain/sob Reason for Consultation: chf Requesting MD: Dr. Vianne Bulls  HPI: Jeffrey Holloway is a 32 y.o. male with history of poly substance abuse, hypertensive urgency,  acute renal failure requiring hemodialysis. CXR on admission on 5/9 showed interstitial edema. CT revealed no nephrolithiasis or obstruction. Repeat cxr showed bilateral pleural effusion right grater than left. Renal biopsy was obtained. EKG showed nsr with  anterolateral st-t wave changes. Mild troponin elevations. HGB was 9.3. Echo is pending. He denies any chest pain with exertion as an outpatient. He is active in his work and has no problems with this activity. He smokes cigarettes and uses cocaine. Unaware of heart disease in his family. He states he has heart burn which improves with zantac. He is currently pain free.   History reviewed. No pertinent past medical history.    Surgical History:  Past Surgical History:  Procedure Laterality Date  . APPENDECTOMY       Home Meds: Prior to Admission medications   Medication Sig Start Date End Date Taking? Authorizing Provider  calcium carbonate (TUMS - DOSED IN MG ELEMENTAL CALCIUM) 500 MG chewable tablet Chew 1 tablet by mouth 2 (two) times daily.   Yes [provider]  ibuprofen (ADVIL,MOTRIN) 200 MG tablet Take 600 mg by mouth every 6 (six) hours as needed for moderate pain.   Yes [provider]  omeprazole (PRILOSEC) 20 MG capsule Take 20 mg by mouth daily.   Yes [provider]  traMADol (ULTRAM) 50 MG tablet Take 1 tablet (50 mg total) by mouth every 6 (six) hours as needed. Patient not taking: Reported on 10/10/2017 01/23/16   Loney Hering, MD    Inpatient Medications:  . amLODipine  10  mg Oral Daily  . calcium acetate  1,334 mg Oral TID WC  . cloNIDine  0.1 mg Oral BID  . docusate sodium  100 mg Oral BID  . heparin  5,000 Units Subcutaneous Q8H  . hydrALAZINE  100 mg Oral Q8H  . mouth rinse  15 mL Mouth Rinse BID  . nicotine  14 mg Transdermal Daily  . pantoprazole  40 mg Oral Daily   . sodium chloride 10 mL/hr at 10/14/17 0900  . sodium chloride    . methylPREDNISolone (SOLU-MEDROL) injection 1,000 mg (10/14/17 1737)    Allergies: No Known Allergies  Social History   Socioeconomic History  . Marital status: Married    Spouse name: Not on file  . Number of children: Not on file  . Years of education: Not on file  . Highest education level: Not on file  Occupational History  . Not on file  Social Needs  . Financial resource strain: Not on file  . Food insecurity:    Worry: Not on file    Inability: Not on file  . Transportation needs:    Medical: Not on file    Non-medical: Not on file  Tobacco Use  . Smoking status: Current Every Day Smoker    Packs/day: 1.00    Types: Cigarettes  . Smokeless tobacco: Never Used  Substance and Sexual Activity  . Alcohol use: Yes  . Drug use: Yes    Types: Marijuana, Cocaine  . Sexual activity: Not on file  Lifestyle  . Physical activity:  Days per week: Not on file    Minutes per session: Not on file  . Stress: Not on file  Relationships  . Social connections:    Talks on phone: Not on file    Gets together: Not on file    Attends religious service: Not on file    Active member of club or organization: Not on file    Attends meetings of clubs or organizations: Not on file    Relationship status: Not on file  . Intimate partner violence:    Fear of current or ex partner: Not on file    Emotionally abused: Not on file    Physically abused: Not on file    Forced sexual activity: Not on file  Other Topics Concern  . Not on file  Social History Narrative  . Not on file     History reviewed. No  pertinent family history.   Review of Systems: A 12-system review of systems was performed and is negative except as noted in the HPI.  Labs: Recent Labs    10/13/17 1822 10/14/17 0005 10/14/17 0444 10/14/17 1006  TROPONINI 0.08* 0.06* 0.05* 0.05*   Lab Results  Component Value Date   WBC 12.0 (H) 10/14/2017   HGB 9.3 (L) 10/14/2017   HCT 26.5 (L) 10/14/2017   MCV 94.7 10/14/2017   PLT 223 10/14/2017    Recent Labs  Lab 10/14/17 0444  NA 130*  K 3.6  CL 90*  CO2 25  BUN 59*  CREATININE 8.84*  CALCIUM 7.9*  GLUCOSE 121*   No results found for: CHOL, HDL, LDLCALC, TRIG No results found for: DDIMER  Radiology/Studies:  Dg Chest 2 View  Result Date: 10/10/2017 CLINICAL DATA:  32 year old male with chest pain. EXAM: CHEST - 2 VIEW COMPARISON:  None. FINDINGS: There is diffuse interstitial streaky densities and nodularity as well as Kerley B-lines. Findings consistent with interstitial edema. Superimposed pneumonia is not entirely excluded. Clinical correlation is recommended. There are probably trace bilateral pleural effusions. There is no focal consolidation or pneumothorax. The cardiac silhouette is within normal limits. No acute osseous pathology. IMPRESSION: Interstitial edema. Superimposed pneumonia is not excluded. Clinical correlation is recommended. Electronically Signed   By: Anner Crete M.D.   On: 10/10/2017 05:03   US Renal  Result Date: 10/10/2017 CLINICAL DATA:  Renal failure EXAM: RENAL / URINARY TRACT ULTRASOUND COMPLETE COMPARISON:  CT of the abdomen and pelvis FINDINGS: Right Kidney: Length: 12 cm. Mild increased echogenicity is noted. No hydronephrosis or mass lesion is noted. Left Kidney: Length: 12.3 cm. Mild increased echogenicity is noted without evidence of mass effect. Bladder: Appears normal for degree of bladder distention. IMPRESSION: Mild increased echogenicity bilaterally. No obstructive changes are seen. Electronically Signed   By: Inez Catalina  M.D.   On: 10/10/2017 17:31   Dg Chest Port 1 View  Result Date: 10/10/2017 CLINICAL DATA:  Encounter for central line placement. EXAM: PORTABLE CHEST 1 VIEW COMPARISON:  Portable film earlier today. FINDINGS: The heart is enlarged. Perihilar, RIGHT greater than LEFT infiltrates are increased. RIGHT pleural effusion. Dual lumen catheter has been placed from a LEFT IJ approach. Tip lies in the mid to distal SVC. There is no pneumothorax. IMPRESSION: Worsening aeration, particularly on the RIGHT. Dual lumen catheter tip mid to distal SVC. LEFT IJ approach. No pneumothorax. Electronically Signed   By: Staci Righter M.D.   On: 10/10/2017 15:17   Ct Renal Stone Study  Result Date: 10/10/2017 CLINICAL DATA:  Initial  evaluation for acute bilateral flank pain. EXAM: CT ABDOMEN AND PELVIS WITHOUT CONTRAST TECHNIQUE: Multidetector CT imaging of the abdomen and pelvis was performed following the standard protocol without IV contrast. COMPARISON:  Prior CT from 01/17/2012. FINDINGS: Lower chest: Layering bilateral pleural effusions. Diffuse interlobular septal thickening at the lung bases consistent with interstitial edema. Superimposed atelectatic changes. Hepatobiliary: Limited noncontrast evaluation of the liver is unremarkable. Gallbladder within normal limits. No biliary dilatation. Pancreas: Pancreas within normal limits. Spleen: Spleen within normal limits. Adrenals/Urinary Tract: Adrenal glands are normal. Kidneys equal in size without evidence for nephrolithiasis or hydronephrosis. No radiopaque calculi seen along the course of either renal collecting system. No hydroureter. Partially distended bladder within normal limits. No layering stones within the bladder lumen. Stomach/Bowel: Stomach within normal limits. No evidence for bowel obstruction. Appendix is absent. Mild colonic diverticulosis without evidence for acute diverticulitis. No acute inflammatory changes seen about the bowels. Vascular/Lymphatic:  Intra-abdominal aorta of normal caliber. No adenopathy. Reproductive: Prostate normal. Other: No free air or fluid. Musculoskeletal: No acute osseous abnormality. No worrisome lytic or blastic osseous lesions. IMPRESSION: 1. No CT evidence for nephrolithiasis or obstructive uropathy. No other acute intra-abdominal or pelvic process. 2. Small moderate layering bilateral pleural effusions with evidence of pulmonary interstitial edema. 3. Mild colonic diverticulosis without evidence for acute diverticulitis. 4. Sequelae of prior appendectomy. Electronically Signed   By: Jeannine Boga M.D.   On: 10/10/2017 07:13   US Biopsy (kidney)  Result Date: 10/14/2017 INDICATION: 32 year old with acute renal failure and on hemodialysis. 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 Renal biopsy has been requested. EXAM: ULTRASOUND-GUIDED RANDOM LEFT RENAL BIOPSY MEDICATIONS: None. ANESTHESIA/SEDATION: Moderate (conscious) sedation was employed during this procedure. A total of Versed 4.5 mg and Fentanyl 200 mcg was administered intravenously. Moderate Sedation Time: 29 minutes. The patient's level of consciousness and vital signs were monitored continuously by radiology nursing throughout the procedure under my direct supervision. FLUOROSCOPY TIME:  None COMPLICATIONS: None immediate. PROCEDURE: Informed written consent was obtained from the patient after a thorough discussion of the procedural risks, benefits and alternatives. All questions were addressed. A timeout was performed prior to the initiation of the procedure. Patient was placed prone. Both kidneys were evaluated with ultrasound. The left kidney was selected for biopsy. The left flank was prepped with chlorhexidine and a sterile field was created. Skin and soft tissues were anesthetized with 1% lidocaine. Coaxial needle was directed towards the left kidney lower pole with ultrasound guidance. 42 gauge core needle was then used to biopsy the left kidney lower pole. The first  core biopsy had scant material. Therefore, 2 additional ultrasound-guided core biopsies were obtained with the 16 gauge device. The second and third core biopsies were adequate and placed on a Telfa pad with saline. The coaxial needle was removed. Bandage placed over the puncture site. FINDINGS: Increased echogenicity in the kidneys. No hydronephrosis. Core biopsies obtained from the left kidney lower pole. No significant bleeding or hematoma formation following the core biopsies. IMPRESSION: Ultrasound-guided core biopsies of the left kidney lower pole. Electronically Signed   By: Markus Daft M.D.   On: 10/14/2017 16:48    Wt Readings from Last 3 Encounters:  10/14/17 79 kg (174 lb 2.6 oz)  01/23/16 88 kg (194 lb)    EKG: nsr with nonspecific st t wave changes.   Physical Exam:NO acute distress.  Blood pressure 133/85, pulse 73, temperature 97.9 F (36.6 C), temperature source Oral, resp. rate 20, height 6' (1.829 m), weight 79 kg (174  lb 2.6 oz), SpO2 95 %. Body mass index is 23.62 kg/m. General: Well developed, well nourished, in no acute distress. Head: Normocephalic, atraumatic, sclera non-icteric, no xanthomas, nares are without discharge.  Neck: Negative for carotid bruits. JVD not elevated. Lungs: Clear bilaterally to auscultation without wheezes, rales, or rhonchi. Breathing is unlabored. Heart: RRR with S1 S2. No murmurs, rubs, or gallops appreciated. Abdomen: Soft, non-tender, non-distended with normoactive bowel sounds. No hepatomegaly. No rebound/guarding. No obvious abdominal masses. Msk:  Strength and tone appear normal for age. Extremities: No clubbing or cyanosis. No edema.  Distal pedal pulses are 2+ and equal bilaterally. Neuro: Alert and oriented X 3. No facial asymmetry. No focal deficit. Moves all extremities spontaneously. Psych:  Responds to questions appropriately with a normal affect.     Assessment and Plan  32 yo male with no medical history admitted with sob  and headache. Noted to be hypertensive and with renal failure with serum creatinine grater than 10 on admission Has mil troponin elevation of up to 0.12 which is trending down. Does not have a history suggestive of acs however ekg shows anterolateral st t wave changes consistent with possible lateral ischemia. . Is being evaluated for probable vasculitis causeing his renal insufficiency. Is on hemodialysis at present. Electrolytes and creatinine improving. Echo is pending to evaluate lv function and wall motion. SBP improved on amlodipine, clonidine, hydralazine. . Pt uses cocaine on occasion so coronary spasm may be present. Not on beta blockers secondary to cocaine use. Will follow ekg, electrolytes and symptoms. Will follow with you.   Signed, Teodoro Spray MD 10/15/2017, 7:26 AM Pager: [phone]

## 2017-10-15 NOTE — Progress Notes (Signed)
*  PRELIMINARY RESULTS* Echocardiogram 2D Echocardiogram has been performed.  Wallie Char Asja Frommer 10/15/2017, 9:29 AM

## 2017-10-15 NOTE — Plan of Care (Signed)
Nutrition Education Note  32 y/o male with substance abuse and new HD  RD consulted for Renal Education. Provided "Potassium and Phosphorus content of foods" handouts from the Academy of [employer]. Reviewed food groups and provided written recommended serving sizes specifically determined for patient's current nutritional status.   Explained why diet restrictions are needed and provided lists of foods to limit/avoid that are high potassium, sodium, and phosphorus. Provided specific recommendations on safer alternatives of these foods. Strongly encouraged compliance of this diet.   Discussed importance of protein intake at each meal and snack. Provided examples of how to maximize protein intake throughout the day. Discussed the possible need for fluid restriction with dialysis, importance of minimizing weight gain between HD treatments, and renal-friendly beverage options.  Encouraged pt to take a daily renal multivitamin   Encouraged pt to discuss specific diet questions/concerns with RD at HD outpatient facility. Teach back method used.  Expect fair compliance.  Body mass index is 23.62 kg/m. Pt meets criteria for overweight based on current BMI.  Current diet order is renal, patient is consuming approximately 50-100% of meals at this time. Labs and medications reviewed. No further nutrition interventions warranted at this time. RD contact information provided. If additional nutrition issues arise, please re-consult RD.  Koleen Distance MS, RD, LDN Pager #- [phone] Office#- [phone] After Hours Pager: [phone]

## 2017-10-15 NOTE — Progress Notes (Signed)
[facility] [place] NAME: Jeffrey Holloway    MR#:  [id number]  DATE OF BIRTH:  04-29-1986  SUBJECTIVE: Kidney biopsy yesterday.  Complains of pain at that site.  BP improved, received hemodialysis yesterday.    REVIEW OF SYSTEMS:    Review of Systems  Constitutional: Negative for chills and fever.  HENT: Negative for congestion and tinnitus.   Eyes: Negative for blurred vision and double vision.  Respiratory: Negative for cough, shortness of breath and wheezing.   Cardiovascular: Negative for chest pain, orthopnea and PND.  Gastrointestinal: Negative for abdominal pain, diarrhea, nausea and vomiting.  Genitourinary: Negative for dysuria and hematuria.  Neurological: Negative for dizziness, sensory change and focal weakness.  All other systems reviewed and are negative.   Nutrition: Renal/Heart healthy Tolerating Diet: Yes Tolerating PT: Await Eval.    DRUG ALLERGIES:  No Known Allergies  VITALS:  Blood pressure (!) 155/95, pulse 80, temperature 97.6 F (36.4 C), temperature source Oral, resp. rate 18, height 6' (1.829 m), weight 79 kg (174 lb 2.6 oz), SpO2 94 %.  PHYSICAL EXAMINATION:   Physical Exam  GENERAL:  32 y.o.-year-old patient lying in bed in NAD.    EYES: Pupils equal, round, reactive to light and accommodation. No scleral icterus. Extraocular muscles intact.  HEENT: Head atraumatic, normocephalic. Oropharynx and nasopharynx clear.  NECK:  Supple, no jugular venous distention. No thyroid enlargement, no tenderness.  LUNGS: Normal breath sounds bilaterally, no wheezing, rales, rhonchi. No use of accessory muscles of respiration.  CARDIOVASCULAR: S1, S2 normal. No murmurs, rubs, or gallops.  ABDOMEN: Soft, nontender, nondistended. Bowel sounds present. No organomegaly or mass.  EXTREMITIES: No cyanosis, clubbing or edema b/l.    NEUROLOGIC: Cranial nerves II through XII are intact. No focal Motor or sensory deficits b/l.    PSYCHIATRIC: The patient is alert and oriented x 3.  SKIN: No obvious rash, lesion, or ulcer.    LABORATORY PANEL:   CBC Recent Labs  Lab 10/15/17 1046  WBC 13.3*  HGB 9.8*  HCT 28.4*  PLT 240   ------------------------------------------------------------------------------------------------------------------  Chemistries  Recent Labs  Lab 10/15/17 1046  NA 132*  K 3.9  CL 94*  CO2 28  GLUCOSE 183*  BUN 53*  CREATININE 6.67*  CALCIUM 8.1*   ------------------------------------------------------------------------------------------------------------------  Cardiac Enzymes Recent Labs  Lab 10/14/17 1006  TROPONINI 0.05*   ------------------------------------------------------------------------------------------------------------------  RADIOLOGY:  US Biopsy (kidney)  Result Date: 10/14/2017 INDICATION: 32 year old with acute renal failure and on hemodialysis. 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 Renal biopsy has been requested. EXAM: ULTRASOUND-GUIDED RANDOM LEFT RENAL BIOPSY MEDICATIONS: None. ANESTHESIA/SEDATION: Moderate (conscious) sedation was employed during this procedure. A total of Versed 4.5 mg and Fentanyl 200 mcg was administered intravenously. Moderate Sedation Time: 29 minutes. The patient's level of consciousness and vital signs were monitored continuously by radiology nursing throughout the procedure under my direct supervision. FLUOROSCOPY TIME:  None COMPLICATIONS: None immediate. PROCEDURE: Informed written consent was obtained from the patient after a thorough discussion of the procedural risks, benefits and alternatives. All questions were addressed. A timeout was performed prior to the initiation of the procedure. Patient was placed prone. Both kidneys were evaluated with ultrasound. The left kidney was selected for biopsy. The left flank was prepped with chlorhexidine and a sterile field was created. Skin and soft tissues were anesthetized with 1% lidocaine.  Coaxial needle was directed towards the left kidney lower pole with ultrasound guidance. 16 gauge core needle was then used to  biopsy the left kidney lower pole. The first core biopsy had scant material. Therefore, 2 additional ultrasound-guided core biopsies were obtained with the 16 gauge device. The second and third core biopsies were adequate and placed on a Telfa pad with saline. The coaxial needle was removed. Bandage placed over the puncture site. FINDINGS: Increased echogenicity in the kidneys. No hydronephrosis. Core biopsies obtained from the left kidney lower pole. No significant bleeding or hematoma formation following the core biopsies. IMPRESSION: Ultrasound-guided core biopsies of the left kidney lower pole. Electronically Signed   By: Markus Daft M.D.   On: 10/14/2017 16:48     ASSESSMENT AND PLAN:   32 yo male w/ no significant PMH who presented to the hospital due to shortness of breath, chest pain noted to be in acute renal failure with hypertensive urgency.  1.  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  Continue IV PPIs while on high-dose steroids.  Post kidney biopsy yesterday.  Nephrology to follow results, further plan depending on kidney biopsy results. -Patient will be having hemodialysis 3 times a week Tuesday, Thursday, Saturday.  Will need IV rituximab versus cyclophosphamide if the biopsy is positive for vasculitis.    . 2.  Acute respiratory failure with hypoxia-secondary to volume overload and pulmonary edema.  Proved.  Echocardiogram showed EF more than 55%.  Seen by Dr. Ubaldo Glassing.  Slightly red troponin secondary demand ischemia.  - 3.  Accelerated hypertension- he has no previous history of essential hypertension.  Avoid beta-blockers in setting of cocaine. -Much improved  and patient is off nicardipine drip now.  Continue Norvasc, oral hydralazine.,  Low-dose clonidine.  BP improved since admission  4.  Elevated troponin-secondary to supply demand ischemia and poor renal clearance from renal failure. - No evidence of acute coronary syndrome. Echocardiogram shows EF more than 55%.  5.  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 - Hemoglobin stable and will continue to monitor.  #6. headache likely due to migraine improved with Fioricet.     had acute gastritis while on high-dose steroids, improved with IV PPI. Anemia of renal failure: Secondary hyperparathyroidism with hyperphosphatemia, hypocalcemia, PTH 188, start calcium  acetate  All the records are reviewed and case discussed with Care Management/Social Worker. Management plans discussed with the patient, family and they are in agreement.  CODE STATUS: Full code  DVT Prophylaxis: Heparin SQ  TOTAL TIME TAKING CARE OF THIS PATIENT: 35 minutes.   POSSIBLE D/C IN 3-4 DAYS, DEPENDING ON CLINICAL CONDITION. More than 50% of time spent in counseling, coordination of care. Epifanio Lesches M.D on 10/15/2017 at 2:12 PM  Between 7am to 6pm - Pager - [phone]  After 6pm go to [email or web address] - Proofreader  [facility]  Office  [phone]  CC: Primary care physician; Patient, No Pcp Per

## 2017-10-15 NOTE — Consult Note (Signed)
[facility] Consult Note  MRN : [id number]  Jeffrey Holloway is a 32 y.o. (November 29, 1985) male who presents with chief complaint of  Chief Complaint  Patient presents with  . Chest Pain  .  History of Present Illness:  I am asked to evaluate the patient by Dr. Juleen China.  The patient is a 32 year old gentleman with a history of substance abuse who presented to the hospital with chest pain.  Urine tox screen was positive for cocaine and THC.  EKG suggested lateral ischemic train changes which are likely secondary to vasospasm.  Blood pressure was noted to be markedly elevated and a admission creatinine was greater than 10.  Since admission his blood pressures been much better controlled and he is overall feeling much better but his kidney function did not recover temporary catheter was placed and he is now undergoing evaluation for a more permanent access prior to discharge.  Current Facility-Administered Medications  Medication Dose Route Frequency Provider Last Rate Last Dose  . 0.9 %  sodium chloride infusion   Intravenous Continuous Lateef, Munsoor, MD 10 mL/hr at 10/14/17 0900    . 0.9 %  sodium chloride infusion   Intravenous Continuous Markus Daft, MD      . 0.9 %  sodium chloride infusion   Intravenous Continuous Romeo Zielinski, Dolores Lory, MD      . Doug Sou Hold] acetaminophen (TYLENOL) tablet 650 mg  650 mg Oral Q6H PRN Arta Silence, MD   650 mg at 10/12/17 1844   Or  . [MAR Hold] acetaminophen (TYLENOL) suppository 650 mg  650 mg Rectal Q6H PRN Arta Silence, MD      . Doug Sou Hold] alum & mag hydroxide-simeth (MAALOX/MYLANTA) 200-200-20 MG/5ML suspension 30 mL  30 mL Oral Q4H PRN Idelle Crouch, MD   30 mL at 10/15/17 0848  . [MAR Hold] amLODipine (NORVASC) tablet 10 mg  10 mg Oral Daily Lateef, Munsoor, MD   10 mg at 10/15/17 1030  . [MAR Hold] bisacodyl (DULCOLAX) EC tablet 5 mg  5 mg Oral Daily PRN Arta Silence, MD      . Doug Sou Hold] calcium  acetate (PHOSLO) capsule 1,334 mg  1,334 mg Oral TID WC Kolluru, Sarath, MD   1,334 mg at 10/15/17 0816  . ceFAZolin (ANCEF) IVPB 1 g/50 mL premix  1 g Intravenous Once Katha Cabal, MD 100 mL/hr at 10/15/17 1556 1 g at 10/15/17 1556  . [MAR Hold] cloNIDine (CATAPRES) tablet 0.1 mg  0.1 mg Oral BID Henreitta Leber, MD   0.1 mg at 10/15/17 1030  . [MAR Hold] docusate sodium (COLACE) capsule 100 mg  100 mg Oral BID Epifanio Lesches, MD   100 mg at 10/15/17 1030  . [MAR Hold] feeding supplement (NEPRO CARB STEADY) liquid 237 mL  237 mL Oral BID BM Epifanio Lesches, MD      . fentaNYL (SUBLIMAZE) injection    PRN Delana Meyer Dolores Lory, MD   25 mcg at 10/15/17 1552  . [MAR Hold] heparin injection 5,000 Units  5,000 Units Subcutaneous Q8H Arta Silence, MD   Stopped at 10/15/17 1507  . [MAR Hold] hydrALAZINE (APRESOLINE) injection 10 mg  10 mg Intravenous Q6H PRN Henreitta Leber, MD      . Doug Sou Hold] hydrALAZINE (APRESOLINE) tablet 100 mg  100 mg Oral Q8H Lateef, Munsoor, MD   Stopped at 10/15/17 1359  . [MAR Hold] HYDROmorphone (DILAUDID) injection 2 mg  2 mg Intravenous Q6H PRN Epifanio Lesches, MD  2 mg at 10/15/17 0816  . [facility] Hold] MEDLINE mouth rinse  15 mL Mouth Rinse BID Conforti, John, DO   15 mL at 10/15/17 1033  . midazolam (VERSED) injection    PRN Emonni Depasquale, Dolores Lory, MD   1 mg at 10/15/17 1552  . [MAR Hold] multivitamin (RENA-VIT) tablet 1 tablet  1 tablet Oral QHS Epifanio Lesches, MD      . Doug Sou Hold] nicotine (NICODERM CQ - dosed in mg/24 hours) patch 14 mg  14 mg Transdermal Daily Epifanio Lesches, MD   14 mg at 10/15/17 1206  . [MAR Hold] ondansetron (ZOFRAN) tablet 4 mg  4 mg Oral Q6H PRN Arta Silence, MD       Or  . Doug Sou Hold] ondansetron (ZOFRAN) injection 4 mg  4 mg Intravenous Q6H PRN Arta Silence, MD   4 mg at 10/15/17 1452  . [MAR Hold] pantoprazole (PROTONIX) EC tablet 40 mg  40 mg Oral Daily Epifanio Lesches, MD   40 mg at  10/15/17 1030  . [MAR Hold] predniSONE (DELTASONE) tablet 80 mg  80 mg Oral Q breakfast Kolluru, Sarath, MD      . Doug Sou Hold] senna-docusate (Senokot-S) tablet 1 tablet  1 tablet Oral QHS PRN Arta Silence, MD        History reviewed. No pertinent past medical history.  Past Surgical History:  Procedure Laterality Date  . APPENDECTOMY      Social History Social History   Tobacco Use  . Smoking status: Current Every Day Smoker    Packs/day: 1.00    Types: Cigarettes  . Smokeless tobacco: Never Used  Substance Use Topics  . Alcohol use: Yes  . Drug use: Yes    Types: Marijuana, Cocaine    Family History History reviewed. No pertinent family history. No family history of bleeding/clotting disorders, porphyria or autoimmune disease   No Known Allergies   REVIEW OF SYSTEMS (Negative unless checked)  Constitutional: [] Weight loss  [] Fever  [] Chills Cardiac: [] Chest pain   [] Chest pressure   [] Palpitations   [] Shortness of breath at rest   [] Shortness of breath with exertion. Vascular:  [] Pain in legs with walking   [] Pain in legs at rest  [] History of DVT   [] Phlebitis   [] Swelling in legs   [] Varicose veins   [] Non-healing ulcers Pulmonary:   [] Uses home oxygen   [] Productive cough   [] Hemoptysis   [] Wheeze  [] COPD   [] Asthma Neurologic:  [] Dizziness  [] Blackouts   [] Seizures   [] History of stroke   [] History of TIA  [] Aphasia   [] Temporary blindness   [] Dysphagia   [] Weakness or numbness in arms   [] Weakness or numbness in legs Musculoskeletal:  [] Arthritis   [] Joint swelling   [] Joint pain   [] Low back pain Hematologic:  [] Easy bruising    [] Hypercoagulable state   [] Anemic  [] Hepatitis Gastrointestinal:  [] Blood in stool   [] Vomiting blood  [] Gastroesophageal reflux/heartburn   [] Difficulty swallowing. Genitourinary:  [x] Chronic kidney disease   [] Difficult urination  [] Frequent urination  [] Burning with urination   [] Blood in urine Skin:  [] Rashes   [] Ulcers    Psychological:  [] History of anxiety   []  History of major depression.    Physical Examination  Vitals:   10/15/17 1257 10/15/17 1506 10/15/17 1508 10/15/17 1555  BP: (!) 155/95 (!) 167/96    Pulse: 80 81    Resp: 18 16    Temp: 97.6 F (36.4 C)  97.7 F (36.5 C)   TempSrc: Oral Oral  Axillary   SpO2: 94% 93%  97%  Weight:      Height:       Body mass index is 23.62 kg/m.  Head: Warden/AT, No temporalis wasting.  Ear/Nose/Throat: Nares w/o erythema or drainage, oropharynx mucus membranes moist Eyes: PERRLA, Sclera nonicteric.  Neck: Supple,   No JVD.  Pulmonary:  Breath sounds equal bilaterally, no use of accessory muscles.  Cardiac: RRR, normal S1, S2,  Vascular: 2+ radial and femoral pulses bilaterally; left IJ temporary catheter present clean dry and intact Gastrointestinal: soft, non-tender, non-distended.  Musculoskeletal: Moves all extremities.  No deformity or atrophy. No edema. Neurologic: 5/5 motor sensory, face appears symmetric speech fluent  Psychiatric: Appropriate mood for situation. Dermatologic: No rashes or ulcers noted.  No cellulitis or open wounds. Lymph : No Cervical,  or Inguinal lymphadenopathy.      CBC Lab Results  Component Value Date   WBC 13.3 (H) 10/15/2017   HGB 9.8 (L) 10/15/2017   HCT 28.4 (L) 10/15/2017   MCV 96.0 10/15/2017   PLT 240 10/15/2017    BMET    Component Value Date/Time   NA 132 (L) 10/15/2017 1046   NA 134 (L) 01/17/2012 0126   K 3.9 10/15/2017 1046   K 3.7 01/17/2012 0126   CL 94 (L) 10/15/2017 1046   CL 100 01/17/2012 0126   CO2 28 10/15/2017 1046   CO2 27 01/17/2012 0126   GLUCOSE 183 (H) 10/15/2017 1046   GLUCOSE 98 01/17/2012 0126   BUN 53 (H) 10/15/2017 1046   BUN 7 01/17/2012 0126   CREATININE 6.67 (H) 10/15/2017 1046   CREATININE 0.94 01/17/2012 0126   CALCIUM 8.1 (L) 10/15/2017 1046   CALCIUM 7.1 (L) 10/10/2017 1400   GFRNONAA 10 (L) 10/15/2017 1046   GFRNONAA >60 01/17/2012 0126   GFRAA 11 (L)  10/15/2017 1046   GFRAA >60 01/17/2012 0126   Estimated Creatinine Clearance: 17.3 mL/min (A) (by C-G formula based on SCr of 6.67 mg/dL (H)).  COAG Lab Results  Component Value Date   INR 0.95 10/14/2017   INR 0.93 10/14/2017      Assessment/Plan 1.  End-stage renal disease now on hemodialysis: Patient will undergo placement of a tunneled catheter.  Risks and benefits of been reviewed all questions answered patient agrees to proceed.  Once he has been discharged he will follow-up in the office as an outpatient where vein mapping can be performed and preparations for an upper extremity access will be made.  2.  Hypertension: Continue current medications as prescribed.  Blood pressures been under much better control.   Hortencia Pilar, MD  10/15/2017 3:57 PM

## 2017-10-16 ENCOUNTER — Encounter: Payer: Self-pay | Admitting: Vascular Surgery

## 2017-10-16 LAB — HEPATIC FUNCTION PANEL
ALK PHOS: 61 U/L (ref 38–126)
ALT: 14 U/L — AB (ref 17–63)
AST: 23 U/L (ref 15–41)
Albumin: 3.3 g/dL — ABNORMAL LOW (ref 3.5–5.0)
BILIRUBIN TOTAL: 0.6 mg/dL (ref 0.3–1.2)
TOTAL PROTEIN: 6.9 g/dL (ref 6.5–8.1)

## 2017-10-16 MED ORDER — ACETAMINOPHEN 325 MG PO TABS
650.0000 mg | ORAL_TABLET | Freq: Once | ORAL | Status: DC
  Filled 2017-10-16: qty 2

## 2017-10-16 MED ORDER — OXYCODONE HCL 5 MG PO TABS
5.0000 mg | ORAL_TABLET | Freq: Four times a day (QID) | ORAL | Status: DC | PRN
Start: 2017-10-16 — End: 2017-10-18
  Administered 2017-10-16 – 2017-10-18 (×6): 10 mg via ORAL
  Filled 2017-10-16 (×6): qty 2

## 2017-10-16 MED ORDER — MORPHINE SULFATE (PF) 2 MG/ML IV SOLN
2.0000 mg | INTRAVENOUS | Status: DC | PRN
  Administered 2017-10-16 – 2017-10-17 (×2): 2 mg via INTRAVENOUS
  Filled 2017-10-16 (×2): qty 1

## 2017-10-16 MED ORDER — PALONOSETRON HCL INJECTION 0.25 MG/5ML
0.2500 mg | Freq: Once | INTRAVENOUS | Status: AC
  Administered 2017-10-17: 0.25 mg via INTRAVENOUS
  Filled 2017-10-16: qty 5

## 2017-10-16 MED ORDER — EPOETIN ALFA 4000 UNIT/ML IJ SOLN
4000.0000 [IU] | INTRAMUSCULAR | Status: DC
  Administered 2017-10-16 – 2017-10-18 (×2): 4000 [IU] via INTRAVENOUS
  Filled 2017-10-16: qty 1

## 2017-10-16 MED ORDER — DIPHENHYDRAMINE HCL 25 MG PO CAPS
50.0000 mg | ORAL_CAPSULE | Freq: Once | ORAL | Status: AC
  Administered 2017-10-17: 11:00:00 50 mg via ORAL
  Filled 2017-10-16: qty 2
  Filled 2017-10-16: qty 1

## 2017-10-16 MED ORDER — HYDROMORPHONE HCL 1 MG/ML IJ SOLN
2.0000 mg | INTRAMUSCULAR | Status: DC | PRN
  Administered 2017-10-16 (×2): 2 mg via INTRAVENOUS
  Filled 2017-10-16 (×3): qty 2

## 2017-10-16 MED ORDER — ALTEPLASE 2 MG IJ SOLR
2.0000 mg | Freq: Once | INTRAMUSCULAR | Status: AC
  Administered 2017-10-16: 2 mg
  Filled 2017-10-16: qty 2

## 2017-10-16 MED ORDER — RITUXIMAB CHEMO INJECTION 500 MG/50ML
375.0000 mg/m2 | Freq: Once | INTRAVENOUS | Status: AC
  Administered 2017-10-17: 800 mg via INTRAVENOUS
  Filled 2017-10-16: qty 80

## 2017-10-16 MED ORDER — SODIUM CHLORIDE 0.9 % IV SOLN
600.0000 mg | Freq: Once | INTRAVENOUS | Status: AC
  Administered 2017-10-17: 18:00:00 600 mg via INTRAVENOUS
  Filled 2017-10-16: qty 30

## 2017-10-16 NOTE — Progress Notes (Signed)
HD tx start    10/16/17 1005  Vital Signs  Pulse Rate 86  Pulse Rate Source Monitor  Resp 14  BP (!) 161/91  BP Location Left Arm  BP Method Automatic  Patient Position (if appropriate) Sitting  Oxygen Therapy  SpO2 98 %  O2 Device Room Air  During Hemodialysis Assessment  Blood Flow Rate (mL/min) 400 mL/min  Arterial Pressure (mmHg) -180 mmHg  Venous Pressure (mmHg) 140 mmHg  Transmembrane Pressure (mmHg) 70 mmHg  Ultrafiltration Rate (mL/min) 710 mL/min  Dialysate Flow Rate (mL/min) 600 ml/min  Conductivity: Machine  14.2  HD Safety Checks Performed Yes  Dialysis Fluid Bolus Normal Saline  Bolus Amount (mL) 250 mL  Intra-Hemodialysis Comments Tx initiated  Hemodialysis Catheter Right Subclavian  Placement Date/Time: 10/15/17 1612   Time Out: Correct patient;Correct site;Correct procedure  Maximum sterile barrier precautions: Hand hygiene;Cap;Mask;Sterile gown;Sterile gloves;Large sterile sheet  Site Prep: Chlorhexidine  Local Anesthetic: Inje...  Blue Lumen Status Infusing  Red Lumen Status Infusing

## 2017-10-16 NOTE — Progress Notes (Signed)
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  Complains of severe pain at [facility] site ,no other complaints.   post kidney biopsy  REVIEW OF SYSTEMS:    Review of Systems  Constitutional: Negative for chills and fever.  HENT: Negative for congestion and tinnitus.   Eyes: Negative for blurred vision and double vision.  Respiratory: Negative for cough, shortness of breath and wheezing.   Cardiovascular: Negative for chest pain, orthopnea and PND.  Gastrointestinal: Negative for abdominal pain, diarrhea, nausea and vomiting.  Genitourinary: Negative for dysuria and hematuria.  Neurological: Negative for dizziness, sensory change and focal weakness.  All other systems reviewed and are negative.    Nutrition: Renal/Heart healthy Tolerating Diet: Yes Tolerating PT: Await Eval.    DRUG ALLERGIES:  No Known Allergies  VITALS:  Blood pressure (!) 153/84, pulse 72, temperature 98 F (36.7 C), temperature source Oral, resp. rate 19, height 6' (1.829 m), weight 79 kg (174 lb 2.6 oz), SpO2 95 %.  PHYSICAL EXAMINATION:   Physical Exam  GENERAL:  32 y.o.-year-old patient lying in bed in NAD.    EYES: Pupils equal, round, reactive to light and accommodation. No scleral icterus. Extraocular muscles intact.  HEENT: Head atraumatic, normocephalic. Oropharynx and nasopharynx clear.  NECK:  Supple, no jugular venous distention. No thyroid enlargement, no tenderness.  LUNGS: Normal breath sounds bilaterally, no wheezing, rales, rhonchi. No use of accessory muscles of respiration.  CARDIOVASCULAR: S1, S2 normal. No murmurs, rubs, or gallops.  ABDOMEN: Soft, nontender, nondistended. Bowel sounds present. No organomegaly or mass.  EXTREMITIES: No cyanosis, clubbing or edema b/l.    NEUROLOGIC: Cranial nerves II through XII are  intact. No focal Motor or sensory deficits b/l.   PSYCHIATRIC: The patient is alert and oriented x 3.  SKIN: No obvious rash, lesion, or ulcer.  Permacath site is intact without evidence of infection.   LABORATORY PANEL:   CBC Recent Labs  Lab 10/15/17 1046  WBC 13.3*  HGB 9.8*  HCT 28.4*  PLT 240   ------------------------------------------------------------------------------------------------------------------  Chemistries  Recent Labs  Lab 10/15/17 1046  NA 132*  K 3.9  CL 94*  CO2 28  GLUCOSE 183*  BUN 53*  CREATININE 6.67*  CALCIUM 8.1*   ------------------------------------------------------------------------------------------------------------------  Cardiac Enzymes Recent Labs  Lab 10/14/17 1006  TROPONINI 0.05*   ------------------------------------------------------------------------------------------------------------------  RADIOLOGY:  US Biopsy (kidney)  Result Date: 10/14/2017 INDICATION: 32 year old with acute renal failure and on hemodialysis. 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 Renal biopsy has been requested. EXAM: ULTRASOUND-GUIDED RANDOM LEFT RENAL BIOPSY MEDICATIONS: None. ANESTHESIA/SEDATION: Moderate (conscious) sedation was employed during this procedure. A total of Versed 4.5 mg and Fentanyl 200 mcg was administered intravenously. Moderate Sedation Time: 29 minutes. The patient's level of consciousness and vital signs were monitored continuously by radiology nursing throughout the procedure under my direct supervision. FLUOROSCOPY TIME:  None COMPLICATIONS: None immediate. PROCEDURE: Informed written consent was obtained from the patient after a thorough discussion of the procedural risks, benefits and alternatives. All questions were addressed. A timeout was performed prior to the initiation of the procedure. Patient was placed prone. Both kidneys were evaluated with ultrasound. The left kidney was selected for biopsy. The left flank was prepped with  chlorhexidine and a sterile field was created. Skin and soft tissues were anesthetized with 1% lidocaine. Coaxial needle was directed towards the  left kidney lower pole with ultrasound guidance. 33 gauge core needle was then used to biopsy the left kidney lower pole. The first core biopsy had scant material. Therefore, 2 additional ultrasound-guided core biopsies were obtained with the 16 gauge device. The second and third core biopsies were adequate and placed on a Telfa pad with saline. The coaxial needle was removed. Bandage placed over the puncture site. FINDINGS: Increased echogenicity in the kidneys. No hydronephrosis. Core biopsies obtained from the left kidney lower pole. No significant bleeding or hematoma formation following the core biopsies. IMPRESSION: Ultrasound-guided core biopsies of the left kidney lower pole. Electronically Signed   By: Markus Daft M.D.   On: 10/14/2017 16:48     ASSESSMENT AND PLAN:   32 yo male w/ no significant PMH who presented to the hospital due to shortness of breath, chest pain noted to be in acute renal failure with hypertensive urgency.  1.  Acute renal failure-, proteinuria, myeloperoxidase antibody positive: Renal biopsy confirmed concentric glomerulonephritis.  Spoke with nephrology, patient for hemodialysis today, start on rituximab, cyclophosphamide today and then likely home tomorrow.  Patient needs hemodialysis Monday, Wednesday, Friday, continue high-dose steroids prednisone 80 mg daily for 1 week and then slowly wean off at nephrology office.  Continue chemotherapy as per nephrology recommendation.   Patient to follow-up at [facility] for Monday, Wednesday, Friday schedule.  . 2.  Acute respiratory failure with hypoxia-secondary to volume overload and pulmonary edema.  Proved.  Echocardiogram showed EF more than 55%.  Seen by Dr. Ubaldo Glassing.  Slightly red troponin secondary demand ischemia.  - 3.  Accelerated hypertension- he has no previous  history of essential hypertension.  Avoid beta-blockers in setting of cocaine. -Much improved and patient is off nicardipine drip now.  Continue Norvasc, oral hydralazine.,  Low-dose clonidine.  BP improved since admission, advised the patient and wife to have a BP machine at home. 4.  Elevated troponin-secondary to supply demand ischemia and poor renal clearance from renal failure. - No evidence of acute coronary syndrome. Echocardiogram shows EF more than 55%.  5.  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 - Hemoglobin stable and will continue to monitor.  #6. headache likely due to migraine improved with Fioricet.     had acute gastritis while on high-dose steroids, improved with IV PPI. Anemia of renal failure: Secondary hyperparathyroidism with hyperphosphatemia, hypocalcemia, PTH 188, start calcium  acetate Nephro, multivitamins with Rena-Vite started. All the records are reviewed and case discussed with Care Management/Social Worker. Management plans discussed with the patient, family and they are in agreement.  CODE STATUS: Full code  DVT Prophylaxis: Heparin SQ  TOTAL TIME TAKING CARE OF THIS PATIENT: 35 minutes.   POSSIBLE D/C IN 3-4 DAYS, DEPENDING ON CLINICAL CONDITION. More than 50% of time spent in counseling, coordination of care. Epifanio Lesches M.D on 10/16/2017 at 12:56 PM  Between 7am to 6pm - Pager - [phone]  After 6pm go to [email or web address] - Proofreader  [facility]  Office  [phone]  CC: Primary care physician; Patient, No Pcp Per

## 2017-10-16 NOTE — Progress Notes (Signed)
Tx extended to assess perm cath. Right perm cath running per MD orders.    10/16/17 1400  Vital Signs  Pulse Rate 87  Pulse Rate Source Monitor  Resp 13  BP 140/90  BP Location Left Arm  BP Method Automatic  Patient Position (if appropriate) Sitting  Oxygen Therapy  SpO2 96 %  O2 Device Room Air  During Hemodialysis Assessment  Blood Flow Rate (mL/min) 350 mL/min  Arterial Pressure (mmHg) -160 mmHg  Venous Pressure (mmHg) 200 mmHg  Transmembrane Pressure (mmHg) 70 mmHg  Ultrafiltration Rate (mL/min) 770 mL/min  Dialysate Flow Rate (mL/min) 600 ml/min  Conductivity: Machine  14.2  HD Safety Checks Performed Yes  Intra-Hemodialysis Comments Progressing as prescribed (2680)  Hemodialysis Catheter Left Internal jugular  Placement Date/Time: 10/10/17 1236   Placed prior to admission: No  Orientation: Left  Access Location: Internal jugular  Site Condition No complications  Blue Lumen Status Heparin locked  Red Lumen Status Heparin locked  Purple Lumen Status N/A  Catheter fill solution Heparin 1000 units/ml  Catheter fill volume (Arterial) 1.4 cc  Catheter fill volume (Venous) 1.4  Dressing Type Biopatch  Dressing Status Clean;Dry;Intact  Drainage Description None  Post treatment catheter status Capped and Clamped  Hemodialysis Catheter Right Subclavian  Placement Date/Time: 10/15/17 1612   Time Out: Correct patient;Correct site;Correct procedure  Maximum sterile barrier precautions: Hand hygiene;Cap;Mask;Sterile gown;Sterile gloves;Large sterile sheet  Site Prep: Chlorhexidine  Local Anesthetic: Inje...  Blue Lumen Status Infusing  Red Lumen Status Infusing

## 2017-10-16 NOTE — Progress Notes (Signed)
Right chest perm cath was unable to run without experiencing increased venous and arterial pressures. Lines were power flushed, reversed and BFR decreased to 125. MD made aware, cathflo ordered. Left IJ being used at this time.

## 2017-10-16 NOTE — Progress Notes (Signed)
MD paged, pt respiration during sleep between 6-7. Dilaudid discontinued, see new orders. Kyla Balzarine, LPN

## 2017-10-16 NOTE — Progress Notes (Signed)
HD tx end    10/16/17 1447  Vital Signs  Pulse Rate 73  Pulse Rate Source Monitor  Resp 11  BP (!) 167/103  BP Location Left Arm  BP Method Automatic  Patient Position (if appropriate) Sitting  Oxygen Therapy  SpO2 94 %  O2 Device Room Air  During Hemodialysis Assessment  Dialysis Fluid Bolus Normal Saline  Bolus Amount (mL) 250 mL  Intra-Hemodialysis Comments Tx completed

## 2017-10-16 NOTE — Progress Notes (Signed)
Post HD assessment    10/16/17 1453  Neurological  Level of Consciousness Alert  Orientation Level Oriented X4  Respiratory  Respiratory Pattern Regular;Unlabored  Chest Assessment Chest expansion symmetrical  Cardiac  ECG Monitor Yes  Vascular  R Radial Pulse +2  L Radial Pulse +2  Integumentary  Integumentary (WDL) X  Skin Color Appropriate for ethnicity  Musculoskeletal  Musculoskeletal (WDL) X  Generalized Weakness Yes  Assistive Device None  GU Assessment  Genitourinary (WDL) X  Genitourinary Symptoms  (HD)  Psychosocial  Psychosocial (WDL) WDL

## 2017-10-16 NOTE — Progress Notes (Signed)
Pre HD assessment    10/16/17 1001  Neurological  Level of Consciousness Alert  Orientation Level Oriented X4  Respiratory  Respiratory Pattern Regular;Unlabored  Chest Assessment Chest expansion symmetrical  Cardiac  ECG Monitor Yes  Vascular  R Radial Pulse +2  L Radial Pulse +2  Integumentary  Integumentary (WDL) X  Skin Color Appropriate for ethnicity  Musculoskeletal  Musculoskeletal (WDL) X  Generalized Weakness Yes  Assistive Device None  GU Assessment  Genitourinary (WDL) X  Genitourinary Symptoms  (HD)  Psychosocial  Psychosocial (WDL) WDL

## 2017-10-16 NOTE — Progress Notes (Signed)
[facility]  ROUNDING NOTE   Subjective:   Seen and examined on hemodialysis. Tolerating treatment well. Permcath placed yesterday. However unable to run effectively. Now using LIJ temp catheter.   Preliminary results of kidney biopsy with crescentic glomerulonephritis.     HEMODIALYSIS FLOWSHEET:  Blood Flow Rate (mL/min): 400 mL/min Arterial Pressure (mmHg): -170 mmHg Venous Pressure (mmHg): 160 mmHg Transmembrane Pressure (mmHg): 60 mmHg Ultrafiltration Rate (mL/min): 720 mL/min Dialysate Flow Rate (mL/min): 600 ml/min Conductivity: Machine : 14.2 Conductivity: Machine : 14.2 Dialysis Fluid Bolus: Normal Saline Bolus Amount (mL): 20 mL     Objective:  Vital signs in last 24 hours:  Temp:  [97.4 F (36.3 C)-98 F (36.7 C)] 98 F (36.7 C) (05/15 1000) Pulse Rate:  [67-124] 100 (05/15 1115) Resp:  [9-29] 14 (05/15 1115) BP: (147-185)/(80-107) 151/84 (05/15 1115) SpO2:  [91 %-98 %] 93 % (05/15 1115)  Weight change:  Filed Weights   10/12/17 1306 10/12/17 1630 10/14/17 0940  Weight: 81.7 kg (180 lb 1.9 oz) 79 kg (174 lb 2.6 oz) 79 kg (174 lb 2.6 oz)    Intake/Output: I/O last 3 completed shifts: In: 240 [P.O.:240] Out: 150 [Urine:150]   Intake/Output this shift:  No intake/output data recorded.  Physical Exam: General: No acute distress  Head: Normocephalic, atraumatic. Moist oral mucosal membranes  Eyes: Anicteric  Neck: Supple, trachea midline  Lungs:  Clear bilateral, normal effort  Heart: S1S2 no rubs  Abdomen:  Soft, nontender, bowel sounds present  Extremities: No peripheral edema.  Neurologic: Awake, alert, following commands  Skin: No lesions  Access:  Left temp HD catheter, RIJ permcath 5/14 Dr. Delana Meyer    Basic Metabolic Panel: Recent Labs  Lab 10/11/17 0448  10/12/17 0536 10/12/17 1437 10/13/17 1822 10/14/17 0444 10/14/17 1006 10/15/17 1046  NA 139  --  137  --  129* 130*  --  132*  K 3.6  --  3.5  --  3.1* 3.6  --   3.9  CL 107  --  102  --  90* 90*  --  94*  CO2 19*  --  25  --  26 25  --  28  GLUCOSE 90  --  87  --  135* 121*  --  183*  BUN 70*  --  48*  --  48* 59*  --  53*  CREATININE 11.09*  --  8.80*  --  7.67* 8.84*  --  6.67*  CALCIUM 7.6*  --  7.6*  --  8.3* 7.9*  --  8.1*  PHOS  --    < >  --  6.3* 7.3* 7.5* 8.3* 4.6   < > = values in this interval not displayed.    Liver Function Tests: Recent Labs  Lab 10/13/17 1822 10/14/17 0444 10/15/17 1046  ALBUMIN 2.9* 2.6* 3.1*   No results for input(s): LIPASE, AMYLASE in the last 168 hours. No results for input(s): AMMONIA in the last 168 hours.  CBC: Recent Labs  Lab 10/10/17 0350 10/11/17 0448 10/14/17 0913 10/15/17 1046  WBC 7.8 7.5 12.0* 13.3*  NEUTROABS  --   --  11.2*  --   HGB 10.0* 9.5* 9.3* 9.8*  HCT 28.3* 27.3* 26.5* 28.4*  MCV 95.4 95.3 94.7 96.0  PLT 198 158 223 240    Cardiac Enzymes: Recent Labs  Lab 10/10/17 0350 10/13/17 1822 10/14/17 0005 10/14/17 0444 10/14/17 1006  CKTOTAL 672*  --   --   --   --  TROPONINI 0.12* 0.08* 0.06* 0.05* 0.05*    BNP: Invalid input(s): POCBNP  CBG: Recent Labs  Lab 10/10/17 1335  GLUCAP 99    Microbiology: Results for orders placed or performed during the hospital encounter of 10/10/17  MRSA PCR Screening     Status: None   Collection Time: 10/10/17  2:00 PM  Result Value Ref Range Status   MRSA by PCR NEGATIVE NEGATIVE Final    Comment:        The GeneXpert MRSA Assay (FDA approved for NASAL specimens only), is one component of a comprehensive MRSA colonization surveillance program. It is not intended to diagnose MRSA infection nor to guide or monitor treatment for MRSA infections. Performed at [facility], [address] [place], [place] [address]     Coagulation Studies: Recent Labs    10/14/17 0913 10/14/17 1006  LABPROT 12.4 12.6  INR 0.93 0.95    Urinalysis: No results for input(s): COLORURINE, LABSPEC, PHURINE, GLUCOSEU,  HGBUR, BILIRUBINUR, KETONESUR, PROTEINUR, UROBILINOGEN, NITRITE, LEUKOCYTESUR in the last 72 hours.  Invalid input(s): APPERANCEUR    Imaging: US Biopsy (kidney)  Result Date: 10/14/2017 INDICATION: 32 year old with acute renal failure and on hemodialysis. 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 Renal biopsy has been requested. EXAM: ULTRASOUND-GUIDED RANDOM LEFT RENAL BIOPSY MEDICATIONS: None. ANESTHESIA/SEDATION: Moderate (conscious) sedation was employed during this procedure. A total of Versed 4.5 mg and Fentanyl 200 mcg was administered intravenously. Moderate Sedation Time: 29 minutes. The patient's level of consciousness and vital signs were monitored continuously by radiology nursing throughout the procedure under my direct supervision. FLUOROSCOPY TIME:  None COMPLICATIONS: None immediate. PROCEDURE: Informed written consent was obtained from the patient after a thorough discussion of the procedural risks, benefits and alternatives. All questions were addressed. A timeout was performed prior to the initiation of the procedure. Patient was placed prone. Both kidneys were evaluated with ultrasound. The left kidney was selected for biopsy. The left flank was prepped with chlorhexidine and a sterile field was created. Skin and soft tissues were anesthetized with 1% lidocaine. Coaxial needle was directed towards the left kidney lower pole with ultrasound guidance. 18 gauge core needle was then used to biopsy the left kidney lower pole. The first core biopsy had scant material. Therefore, 2 additional ultrasound-guided core biopsies were obtained with the 16 gauge device. The second and third core biopsies were adequate and placed on a Telfa pad with saline. The coaxial needle was removed. Bandage placed over the puncture site. FINDINGS: Increased echogenicity in the kidneys. No hydronephrosis. Core biopsies obtained from the left kidney lower pole. No significant bleeding or hematoma formation following the core  biopsies. IMPRESSION: Ultrasound-guided core biopsies of the left kidney lower pole. Electronically Signed   By: Markus Daft M.D.   On: 10/14/2017 16:48     Medications:    . alteplase  2 mg Intracatheter Once  . alteplase  2 mg Intracatheter Once  . amLODipine  10 mg Oral Daily  . calcium acetate  1,334 mg Oral TID WC  . cloNIDine  0.1 mg Oral BID  . docusate sodium  100 mg Oral BID  . feeding supplement (NEPRO CARB STEADY)  237 mL Oral BID BM  . heparin  5,000 Units Subcutaneous Q8H  . hydrALAZINE  100 mg Oral Q8H  . mouth rinse  15 mL Mouth Rinse BID  . multivitamin  1 tablet Oral QHS  . nicotine  14 mg Transdermal Daily  . pantoprazole  40 mg Oral Daily  . predniSONE  80 mg Oral Q breakfast   acetaminophen **OR** acetaminophen, alum & mag hydroxide-simeth, bisacodyl, hydrALAZINE, HYDROmorphone (DILAUDID) injection, ondansetron **OR** ondansetron (ZOFRAN) IV, senna-docusate  Assessment/ Plan:  [name] is a 32 y.o. white male with GERD who was admitted to [facility] on 10/10/2017 for evaluation of chest pain and shortness of breath.     1. Acute renal failure with hematuria and proteinuria: requiring hemodialysis. 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  Patient with cocaine and NSAID abuse.  Initiated on dialysis on 10/10/17.  Myeloperoxidase positive. Renal biopsy on 5/13.   - Hemodialysis on MWF schedule. Seen and examined on hemodialysis. Tunneled catheter not functioning so will TPA and using temp catheter now.  - Transitioned to PO prednisone 80mg  daily for 1 month then start to taper.  - As per the RITUXVAS trial: will start rituximab IV weekly for four weeks and IV cyclophosphamide once every two weeks for two doses. Then will transition to azothioprine.   3.  Hypertension  - Continue amlodipine, clonidine, hydralazine.  4.  Anemia with renal failure: hemoglobin 9.8 - EPO with HD treatment.   5.  Secondary hyperparathyroidism with  hyperphosphatemia and hypocalcemia. PTH 188. Dietary worked with patient today about renal failure diet.  Phosphorus and calcium at goal.  - Started calcium acetate.    LOS: [place] 5/15/201911:39 AM

## 2017-10-16 NOTE — Progress Notes (Signed)
Post HD assessment. Pt tolerated tx well. Pt c/o pain and tenderness around perm cath access. Initially, perm cath was unable to run. MD ordered cathflo, dwell time 1 hr. After one hour, pt was hooked back up to perm cath to assess. Pt ran for 45 minutes without complication, MD aware. Net UF 2523, goal met.    10/16/17 1454  Vital Signs  Temp 98.1 F (36.7 C)  Temp Source Oral  Pulse Rate 72  Pulse Rate Source Monitor  Resp 12  BP (!) 169/100  BP Location Left Arm  BP Method Automatic  Patient Position (if appropriate) Sitting  Oxygen Therapy  SpO2 98 %  O2 Device Room Air  Dialysis Weight  Weight  (pt in chair, no scale available )  Type of Weight Post-Dialysis  Post-Hemodialysis Assessment  Rinseback Volume (mL) 250 mL  KECN 85.8 V  Dialyzer Clearance Lightly streaked  Duration of HD Treatment -hour(s) 4.25 hour(s)  Hemodialysis Intake (mL) 560 mL  UF Total -Machine (mL) 3083 mL  Net UF (mL) 2523 mL  Tolerated HD Treatment Yes  Education / Care Plan  Dialysis Education Provided Yes  Documented Education in Care Plan Yes  Hemodialysis Catheter Right Subclavian  Placement Date/Time: 10/15/17 1612   Time Out: Correct patient;Correct site;Correct procedure  Maximum sterile barrier precautions: Hand hygiene;Cap;Mask;Sterile gown;Sterile gloves;Large sterile sheet  Site Prep: Chlorhexidine  Local Anesthetic: Inje...  Site Condition No complications  Blue Lumen Status Heparin locked  Red Lumen Status Heparin locked  Purple Lumen Status N/A  Catheter fill solution Heparin 1000 units/ml  Catheter fill volume (Arterial) 1.5 cc  Catheter fill volume (Venous) 1.5  Dressing Type Biopatch  Interventions New dressing  Drainage Description None  Dressing Change Due 10/23/17  Post treatment catheter status Capped and Clamped    

## 2017-10-16 NOTE — Progress Notes (Signed)
Report given to [facility]  in 1C.Patient was transported via wheelchair. No other issues observed.

## 2017-10-16 NOTE — Progress Notes (Signed)
Pre HD assessment  10/16/17 1000  Vital Signs  Temp 98 F (36.7 C)  Temp Source Oral  Pulse Rate 83  Pulse Rate Source Monitor  Resp (!) 29  BP (!) 181/107  BP Location Left Arm  BP Method Automatic  Patient Position (if appropriate) Sitting  Oxygen Therapy  SpO2 97 %  O2 Device Room Air  Pain Assessment  Pain Scale 0-10  Pain Score 7  Pain Type Acute pain  Pain Location Chest  Pain Orientation Right  Pain Radiating Towards neck area  Pain Descriptors / Indicators Aching  Pain Onset On-going  Pain Intervention(s) RN made aware  Dialysis Weight  Weight  (pt in chair, unable to weigh pt, no scale available )  Type of Weight Pre-Dialysis  Time-Out for Hemodialysis  What Procedure? HD  Pt Identifiers(min of two) First/Last Name;MRN/Account#  Correct Site? Yes  Correct Side? Yes  Correct Procedure? Yes  Consents Verified? Yes  Rad Studies Available? N/A  Safety Precautions Reviewed? Yes  Engineer, civil (consulting) Number  ([id number])  Station Number 1  UF/Alarm Test Passed  Conductivity: Meter 14  Conductivity: Machine  14.2  pH 7.4  Reverse Osmosis main  Normal Saline Lot Number [id number]  Dialyzer Lot Number [id number]  Disposable Set Lot Number [id number]  Machine Temperature 98.6 F (37 C)  Musician and Audible Yes  Blood Lines Intact and Secured Yes  Pre Treatment Patient Checks  Vascular access used during treatment Catheter  Patient is receiving dialysis in a chair Yes  Hepatitis B Surface Antigen Results Negative  Date Hepatitis B Surface Antigen Drawn 10/10/17  Hepatitis B Surface Antibody  (>10)  Date Hepatitis B Surface Antibody Drawn 10/10/17  Hemodialysis Consent Verified Yes  Hemodialysis Standing Orders Initiated Yes  ECG (Telemetry) Monitor On Yes  Prime Ordered Normal Saline  Length of  DialysisTreatment -hour(s) 3.5 Hour(s)  Dialyzer Elisio 17H NR  Dialysate 3K, 2.5 Ca  Dialysis Anticoagulant None  Dialysate Flow Ordered 600  Blood Flow Rate  Ordered 400 mL/min  Ultrafiltration Goal 2 Liters  Pre Treatment Labs Renal panel;CBC  Dialysis Blood Pressure Support Ordered Normal Saline  Education / Care Plan  Dialysis Education Provided Yes  Documented Education in Care Plan Yes  Hemodialysis Catheter Right Subclavian  Placement Date/Time: 10/15/17 1612   Time Out: Correct patient;Correct site;Correct procedure  Maximum sterile barrier precautions: Hand hygiene;Cap;Mask;Sterile gown;Sterile gloves;Large sterile sheet  Site Prep: Chlorhexidine  Local Anesthetic: Inje...  Site Condition No complications  Blue Lumen Status Heparin locked  Red Lumen Status Heparin locked  Purple Lumen Status N/A  Dressing Type Gauze/Drain sponge  Dressing Status Clean;Dry;Intact  Interventions New dressing  Drainage Description None  Dressing Change Due 10/23/17

## 2017-10-17 DIAGNOSIS — Z992 Dependence on renal dialysis: Secondary | ICD-10-CM

## 2017-10-17 DIAGNOSIS — F17210 Nicotine dependence, cigarettes, uncomplicated: Secondary | ICD-10-CM

## 2017-10-17 DIAGNOSIS — Z79899 Other long term (current) drug therapy: Secondary | ICD-10-CM

## 2017-10-17 DIAGNOSIS — N057 Unspecified nephritic syndrome with diffuse crescentic glomerulonephritis: Secondary | ICD-10-CM

## 2017-10-17 MED ORDER — SODIUM CHLORIDE 0.9 % IV SOLN
INTRAVENOUS | Status: DC
  Administered 2017-10-17: 11:00:00 via INTRAVENOUS

## 2017-10-17 NOTE — Progress Notes (Signed)
[place] [facility] [place] NAME: Jeffrey Holloway    MR#:  [id number]  DATE OF BIRTH:  04-Apr-1986 Kidney biopsy results showing crescentic glomerulonephritis as per Dr. Juleen China.     post kidney biopsy Getting Rituxan today. REVIEW OF SYSTEMS:    Review of Systems  Constitutional: Negative for chills, diaphoresis and fever.  HENT: Negative for congestion and tinnitus.   Eyes: Negative for blurred vision and double vision.  Respiratory: Negative for cough, shortness of breath and wheezing.   Cardiovascular: Negative for chest pain, orthopnea and PND.  Gastrointestinal: Negative for abdominal pain, diarrhea, nausea and vomiting.  Genitourinary: Negative for dysuria and hematuria.  Neurological: Negative for dizziness, sensory change and focal weakness.  All other systems reviewed and are negative. Permacath site pain better than yesterday.  Nutrition: Renal/Heart healthy Tolerating Diet: Yes Tolerating PT: Await Eval.    DRUG ALLERGIES:  No Known Allergies  VITALS:  Blood pressure (!) 163/98, pulse 68, temperature 98.7 F (37.1 C), temperature source Oral, resp. rate 17, height 6' (1.829 m), weight 79 kg (174 lb 2.6 oz), SpO2 97 %.  PHYSICAL EXAMINATION:   Physical Exam  GENERAL:  32 y.o.-year-old patient lying in bed in NAD.    EYES: Pupils equal, round, reactive to light and accommodation. No scleral icterus. Extraocular muscles intact.  HEENT: Head atraumatic, normocephalic. Oropharynx and nasopharynx clear.  NECK:  Supple, no jugular venous distention. No thyroid enlargement, no tenderness.  LUNGS: Normal breath sounds bilaterally, no wheezing, rales, rhonchi. No use of accessory muscles of respiration.  CARDIOVASCULAR: S1, S2 normal. No murmurs, rubs, or gallops.  ABDOMEN: Soft, nontender, nondistended. Bowel sounds present. No organomegaly or mass.  EXTREMITIES: No cyanosis, clubbing or edema b/l.    NEUROLOGIC: Cranial nerves II  through XII are intact. No focal Motor or sensory deficits b/l.   PSYCHIATRIC: The patient is alert and oriented x 3.  SKIN: No obvious rash, lesion, or ulcer.  Permacath site is intact without evidence of infection.   LABORATORY PANEL:   CBC Recent Labs  Lab 10/15/17 1046  WBC 13.3*  HGB 9.8*  HCT 28.4*  PLT 240   ------------------------------------------------------------------------------------------------------------------  Chemistries  Recent Labs  Lab 10/15/17 1046 10/16/17 1046  NA 132*  --   K 3.9  --   CL 94*  --   CO2 28  --   GLUCOSE 183*  --   BUN 53*  --   CREATININE 6.67*  --   CALCIUM 8.1*  --   AST  --  23  ALT  --  14*  ALKPHOS  --  61  BILITOT  --  0.6   ------------------------------------------------------------------------------------------------------------------  Cardiac Enzymes Recent Labs  Lab 10/14/17 1006  TROPONINI 0.05*   ------------------------------------------------------------------------------------------------------------------  RADIOLOGY:  No results found.   ASSESSMENT AND PLAN:   32 yo male w/ no significant PMH who presented to the hospital due to shortness of breath, chest pain noted to be in acute renal failure with hypertensive urgency.  1.  Acute renal failure-, proteinuria, myeloperoxidase antibody positive: Renal biopsy confirmed crescentric glomerulonephritis,.  Spoke with urology, patient to get cyclophosphamide, Rituxan today, dialysis tomorrow, likely discharge home after hemodialysis tomorrow.. : Patient states IV Rituxan weekly for 4 weeks, IV cyclophosphamide once every 2 weeks for 2 doses, wean off prednisone slowly as an outpatient, continue prednisone 80 mg daily for now for 1 month  Patient to follow-up at [facility] for Monday, Wednesday, Friday schedule.  Marland Kitchen  2.  Acute respiratory failure with hypoxia-secondary to volume overload and pulmonary edema.  Proved.  Echocardiogram showed EF more than  55%.  Seen by Dr. Ubaldo Glassing.  Slightly red troponin secondary demand ischemia.  - 3.  Accelerated hypertension- he has no previous history of essential hypertension.  Avoid beta-blockers in setting of cocaine. -Much improved and patient is off nicardipine drip now.  Continue Norvasc, oral hydralazine.,  Low-dose clonidine.  BP improved since admission, advised the patient and wife to have a BP machine at home. 4.  Elevated troponin-secondary to supply demand ischemia and poor renal clearance from renal failure. - No evidence of acute coronary syndrome. Echocardiogram shows EF more than 55%.  5.  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 - Hemoglobin stable and will continue to monitor.  #6. headache likely due to migraine improved with Fioricet.     had acute gastritis while on high-dose steroids, improved with IV PPI. Anemia of renal failure: Secondary hyperparathyroidism with hyperphosphatemia, hypocalcemia, PTH 188, start calcium  acetate Nephro, multivitamins with Rena-Vite started. Patient agreeable for this plan. All the records are reviewed and case discussed with Care Management/Social Worker. Management plans discussed with the patient, family and they are in agreement.  CODE STATUS: Full code  DVT Prophylaxis: Heparin SQ  TOTAL TIME TAKING CARE OF THIS PATIENT: 35 minutes.   POSSIBLE D/C IN 3-4 DAYS, DEPENDING ON CLINICAL CONDITION. More than 50% of time spent in counseling, coordination of care. Epifanio Lesches M.D on 10/17/2017 at 2:28 PM  Between 7am to 6pm - Pager - [phone]  After 6pm go to [email or web address] - Proofreader  [employer] [facility]  Office  [phone]  CC: Primary care physician; Patient, No Pcp Per

## 2017-10-17 NOTE — Consult Note (Signed)
[facility]  [phone] [phone]  ID: Jeannie Fend OB: [birth year]  MR#: [id number]  OVF#:[id number]  Patient Care Team: Patient, No Pcp Per as PCP - General (General Practice)  CHIEF COMPLAINT: Crescentic glomerulonephritis.  INTERVAL HISTORY: Patient is a 32 year old male who was recently diagnosed with crescentic glomerulonephritis and is now on dialysis.  He was initiated on weekly IV Rituxan and q. 2-week IV cyclophosphamide today.  Oncology is consulted for evaluation and continued treatment as outpatient.  Currently, patient feels well and is asymptomatic.  He has no neurologic complaints.  He denies any recent fevers.  He has a good appetite and denies weight loss.  He has no chest pain or shortness of breath.  He denies any nausea, vomiting, constipation, or diarrhea.  Patient offers no specific complaints today.  REVIEW OF SYSTEMS:   Review of Systems  Constitutional: Negative.  Negative for fever, malaise/fatigue and weight loss.  Respiratory: Negative.  Negative for cough, hemoptysis and shortness of breath.   Cardiovascular: Negative.  Negative for chest pain and leg swelling.  Gastrointestinal: Negative.  Negative for abdominal pain.  Genitourinary: Negative.  Negative for flank pain.  Musculoskeletal: Negative.   Skin: Negative.  Negative for rash.  Neurological: Negative.  Negative for tingling, sensory change, focal weakness and weakness.  Psychiatric/Behavioral: Negative.  The patient is not nervous/anxious.     As per HPI. Otherwise, a complete review of systems is negative.  PAST MEDICAL HISTORY: History reviewed. No pertinent past medical history.  PAST SURGICAL HISTORY: Past Surgical History:  Procedure Laterality Date  . APPENDECTOMY    . DIALYSIS/PERMA CATHETER INSERTION N/A 10/15/2017   Procedure: DIALYSIS/PERMA CATHETER INSERTION;  Surgeon: Katha Cabal, MD;  Location: [facility];  Service:  Cardiovascular;  Laterality: N/A;    FAMILY HISTORY: History reviewed. No pertinent family history.  ADVANCED DIRECTIVES (Y/N):  @ADVDIR @  HEALTH MAINTENANCE: Social History   Tobacco Use  . Smoking status: Current Every Day Smoker    Packs/day: 1.00    Types: Cigarettes  . Smokeless tobacco: Never Used  Substance Use Topics  . Alcohol use: Yes  . Drug use: Yes    Types: Marijuana, Cocaine     Colonoscopy:  PAP:  Bone density:  Lipid panel:  No Known Allergies  Current Facility-Administered Medications  Medication Dose Route Frequency Provider Last Rate Last Dose  . 0.9 %  sodium chloride infusion   Intravenous Continuous Kolluru, Sarath, MD 20 mL/hr at 10/17/17 1122    . acetaminophen (TYLENOL) tablet 650 mg  650 mg Oral Q6H PRN Schnier, Dolores Lory, MD   650 mg at 10/12/17 1844   Or  . acetaminophen (TYLENOL) suppository 650 mg  650 mg Rectal Q6H PRN Schnier, Dolores Lory, MD      . acetaminophen (TYLENOL) tablet 650 mg  650 mg Oral Once Kolluru, Lurena Nida, MD      . alum & mag hydroxide-simeth (MAALOX/MYLANTA) 200-200-20 MG/5ML suspension 30 mL  30 mL Oral Q4H PRN Schnier, Dolores Lory, MD   30 mL at 10/15/17 0848  . amLODipine (NORVASC) tablet 10 mg  10 mg Oral Daily Schnier, Dolores Lory, MD   10 mg at 10/17/17 1109  . bisacodyl (DULCOLAX) EC tablet 5 mg  5 mg Oral Daily PRN Schnier, Dolores Lory, MD   5 mg at 10/16/17 2230  . calcium acetate (PHOSLO) capsule 1,334 mg  1,334 mg Oral TID WC Schnier, Dolores Lory, MD   1,334 mg at 10/17/17 1804  .  cloNIDine (CATAPRES) tablet 0.1 mg  0.1 mg Oral BID Delana Meyer Dolores Lory, MD   0.1 mg at 10/17/17 2225  . docusate sodium (COLACE) capsule 100 mg  100 mg Oral BID Schnier, Dolores Lory, MD   100 mg at 10/17/17 2225  . epoetin alfa (EPOGEN,PROCRIT) injection 4,000 Units  4,000 Units Intravenous Q M,W,F-HD Lavonia Dana, MD   4,000 Units at 10/16/17 1227  . feeding supplement (NEPRO CARB STEADY) liquid 237 mL  237 mL Oral BID BM Schnier, Dolores Lory, MD    237 mL at 10/17/17 1725  . heparin injection 5,000 Units  5,000 Units Subcutaneous Q8H Schnier, Dolores Lory, MD   5,000 Units at 10/17/17 2225  . hydrALAZINE (APRESOLINE) injection 10 mg  10 mg Intravenous Q6H PRN Schnier, Dolores Lory, MD   10 mg at 10/17/17 0437  . hydrALAZINE (APRESOLINE) tablet 100 mg  100 mg Oral Q8H Schnier, Dolores Lory, MD   100 mg at 10/17/17 2225  . MEDLINE mouth rinse  15 mL Mouth Rinse BID Schnier, Dolores Lory, MD   15 mL at 10/17/17 2225  . multivitamin (RENA-VIT) tablet 1 tablet  1 tablet Oral QHS Schnier, Dolores Lory, MD   1 tablet at 10/17/17 2225  . nicotine (NICODERM CQ - dosed in mg/24 hours) patch 14 mg  14 mg Transdermal Daily Schnier, Dolores Lory, MD   14 mg at 10/16/17 0931  . ondansetron (ZOFRAN) tablet 4 mg  4 mg Oral Q6H PRN Schnier, Dolores Lory, MD       Or  . ondansetron [facility]) injection 4 mg  4 mg Intravenous Q6H PRN Schnier, Dolores Lory, MD   4 mg at 10/15/17 1452  . oxyCODONE (Oxy IR/ROXICODONE) immediate release tablet 5-10 mg  5-10 mg Oral Q6H PRN Gouru, Aruna, MD   10 mg at 10/17/17 2224  . pantoprazole (PROTONIX) EC tablet 40 mg  40 mg Oral Daily Schnier, Dolores Lory, MD   40 mg at 10/17/17 1110  . predniSONE (DELTASONE) tablet 80 mg  80 mg Oral Q breakfast Schnier, Dolores Lory, MD   80 mg at 10/17/17 1109  . senna-docusate (Senokot-S) tablet 1 tablet  1 tablet Oral QHS PRN Schnier, Dolores Lory, MD        OBJECTIVE: Vitals:   10/17/17 1429 10/17/17 1947  BP: (!) 160/99 (!) 148/111  Pulse: 76 79  Resp: 20 18  Temp: 98.2 F (36.8 C) 97.8 F (36.6 C)  SpO2: 97% 97%     Body mass index is 23.62 kg/m.    ECOG FS:0 - Asymptomatic  General: Well-developed, well-nourished, no acute distress. Eyes: Pink conjunctiva, anicteric sclera. HEENT: Normocephalic, moist mucous membranes, clear oropharnyx. Lungs: Clear to auscultation bilaterally. Heart: Regular rate and rhythm. No rubs, murmurs, or gallops. Abdomen: Soft, nontender, nondistended. No organomegaly noted,  normoactive bowel sounds. Musculoskeletal: No edema, cyanosis, or clubbing. Neuro: Alert, answering all questions appropriately. Cranial nerves grossly intact. Skin: No rashes or petechiae noted. Psych: Normal affect. Lymphatics: No cervical, calvicular, axillary or inguinal LAD.   LAB RESULTS:  Lab Results  Component Value Date   NA 132 (L) 10/15/2017   K 3.9 10/15/2017   CL 94 (L) 10/15/2017   CO2 28 10/15/2017   GLUCOSE 183 (H) 10/15/2017   BUN 53 (H) 10/15/2017   CREATININE 6.67 (H) 10/15/2017   CALCIUM 8.1 (L) 10/15/2017   PROT 6.9 10/16/2017   ALBUMIN 3.3 (L) 10/16/2017   AST 23 10/16/2017   ALT 14 (L) 10/16/2017   ALKPHOS 61  10/16/2017   BILITOT 0.6 10/16/2017   GFRNONAA 10 (L) 10/15/2017   GFRAA 11 (L) 10/15/2017    Lab Results  Component Value Date   WBC 13.3 (H) 10/15/2017   NEUTROABS 11.2 (H) 10/14/2017   HGB 9.8 (L) 10/15/2017   HCT 28.4 (L) 10/15/2017   MCV 96.0 10/15/2017   PLT 240 10/15/2017     STUDIES: Dg Chest 2 View  Result Date: 10/10/2017 CLINICAL DATA:  32 year old male with chest pain. EXAM: CHEST - 2 VIEW COMPARISON:  None. FINDINGS: There is diffuse interstitial streaky densities and nodularity as well as Kerley B-lines. Findings consistent with interstitial edema. Superimposed pneumonia is not entirely excluded. Clinical correlation is recommended. There are probably trace bilateral pleural effusions. There is no focal consolidation or pneumothorax. The cardiac silhouette is within normal limits. No acute osseous pathology. IMPRESSION: Interstitial edema. Superimposed pneumonia is not excluded. Clinical correlation is recommended. Electronically Signed   By: Anner Crete M.D.   On: 10/10/2017 05:03   US Renal  Result Date: 10/10/2017 CLINICAL DATA:  Renal failure EXAM: RENAL / URINARY TRACT ULTRASOUND COMPLETE COMPARISON:  CT of the abdomen and pelvis FINDINGS: Right Kidney: Length: 12 cm. Mild increased echogenicity is noted. No  hydronephrosis or mass lesion is noted. Left Kidney: Length: 12.3 cm. Mild increased echogenicity is noted without evidence of mass effect. Bladder: Appears normal for degree of bladder distention. IMPRESSION: Mild increased echogenicity bilaterally. No obstructive changes are seen. Electronically Signed   By: Inez Catalina M.D.   On: 10/10/2017 17:31   Dg Chest Port 1 View  Result Date: 10/10/2017 CLINICAL DATA:  Encounter for central line placement. EXAM: PORTABLE CHEST 1 VIEW COMPARISON:  Portable film earlier today. FINDINGS: The heart is enlarged. Perihilar, RIGHT greater than LEFT infiltrates are increased. RIGHT pleural effusion. Dual lumen catheter has been placed from a LEFT IJ approach. Tip lies in the mid to distal SVC. There is no pneumothorax. IMPRESSION: Worsening aeration, particularly on the RIGHT. Dual lumen catheter tip mid to distal SVC. LEFT IJ approach. No pneumothorax. Electronically Signed   By: Staci Righter M.D.   On: 10/10/2017 15:17   Ct Renal Stone Study  Result Date: 10/10/2017 CLINICAL DATA:  Initial evaluation for acute bilateral flank pain. EXAM: CT ABDOMEN AND PELVIS WITHOUT CONTRAST TECHNIQUE: Multidetector CT imaging of the abdomen and pelvis was performed following the standard protocol without IV contrast. COMPARISON:  Prior CT from 01/17/2012. FINDINGS: Lower chest: Layering bilateral pleural effusions. Diffuse interlobular septal thickening at the lung bases consistent with interstitial edema. Superimposed atelectatic changes. Hepatobiliary: Limited noncontrast evaluation of the liver is unremarkable. Gallbladder within normal limits. No biliary dilatation. Pancreas: Pancreas within normal limits. Spleen: Spleen within normal limits. Adrenals/Urinary Tract: Adrenal glands are normal. Kidneys equal in size without evidence for nephrolithiasis or hydronephrosis. No radiopaque calculi seen along the course of either renal collecting system. No hydroureter. Partially distended  bladder within normal limits. No layering stones within the bladder lumen. Stomach/Bowel: Stomach within normal limits. No evidence for bowel obstruction. Appendix is absent. Mild colonic diverticulosis without evidence for acute diverticulitis. No acute inflammatory changes seen about the bowels. Vascular/Lymphatic: Intra-abdominal aorta of normal caliber. No adenopathy. Reproductive: Prostate normal. Other: No free air or fluid. Musculoskeletal: No acute osseous abnormality. No worrisome lytic or blastic osseous lesions. IMPRESSION: 1. No CT evidence for nephrolithiasis or obstructive uropathy. No other acute intra-abdominal or pelvic process. 2. Small moderate layering bilateral pleural effusions with evidence of pulmonary interstitial edema. 3. Mild  colonic diverticulosis without evidence for acute diverticulitis. 4. Sequelae of prior appendectomy. Electronically Signed   By: Jeannine Boga M.D.   On: 10/10/2017 07:13   US Biopsy (kidney)  Result Date: 10/14/2017 INDICATION: 32 year old with acute renal failure and on hemodialysis. 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 Renal biopsy has been requested. EXAM: ULTRASOUND-GUIDED RANDOM LEFT RENAL BIOPSY MEDICATIONS: None. ANESTHESIA/SEDATION: Moderate (conscious) sedation was employed during this procedure. A total of Versed 4.5 mg and Fentanyl 200 mcg was administered intravenously. Moderate Sedation Time: 29 minutes. The patient's level of consciousness and vital signs were monitored continuously by radiology nursing throughout the procedure under my direct supervision. FLUOROSCOPY TIME:  None COMPLICATIONS: None immediate. PROCEDURE: Informed written consent was obtained from the patient after a thorough discussion of the procedural risks, benefits and alternatives. All questions were addressed. A timeout was performed prior to the initiation of the procedure. Patient was placed prone. Both kidneys were evaluated with ultrasound. The left kidney was selected  for biopsy. The left flank was prepped with chlorhexidine and a sterile field was created. Skin and soft tissues were anesthetized with 1% lidocaine. Coaxial needle was directed towards the left kidney lower pole with ultrasound guidance. 35 gauge core needle was then used to biopsy the left kidney lower pole. The first core biopsy had scant material. Therefore, 2 additional ultrasound-guided core biopsies were obtained with the 16 gauge device. The second and third core biopsies were adequate and placed on a Telfa pad with saline. The coaxial needle was removed. Bandage placed over the puncture site. FINDINGS: Increased echogenicity in the kidneys. No hydronephrosis. Core biopsies obtained from the left kidney lower pole. No significant bleeding or hematoma formation following the core biopsies. IMPRESSION: Ultrasound-guided core biopsies of the left kidney lower pole. Electronically Signed   By: Markus Daft M.D.   On: 10/14/2017 16:48    ASSESSMENT: Crescentic glomerulonephritis.  PLAN:    1.  Crescentic glomerular nephritis: Plan is to receive weekly IV rituximab 800 mg (rounded from 750 mg or 375 mg/m) and q2 week IV cyclophosphamide 600 mg ( or 300 mg/m).  Patient received his first dose today and has tolerated them well.  He will return to the cancer center in 1 week to receive cycle 2 of 4 of weekly Rituxan.  His next dose of cyclophosphamide will occur during week 3 of treatment.  Appreciate consult, call with questions.  Lloyd Huger, MD   10/17/2017 10:48 PM

## 2017-10-17 NOTE — Progress Notes (Signed)
[facility]  ROUNDING NOTE   Subjective:   Hemodialysis treatment yesterday. Tolerated treatment well.   Wife and toddler child at bedside.   IV cyclophosphamide and IV rituximab for later today.   Objective:  Vital signs in last 24 hours:  Temp:  [97.9 F (36.6 C)-98.7 F (37.1 C)] 98.7 F (37.1 C) (05/16 0423) Pulse Rate:  [62-88] 68 (05/16 0505) Resp:  [9-20] 17 (05/16 0423) BP: (140-198)/(90-108) 163/98 (05/16 0505) SpO2:  [92 %-98 %] 97 % (05/16 0423)  Weight change:  Filed Weights   10/14/17 0940  Weight: 79 kg (174 lb 2.6 oz)    Intake/Output: I/O last 3 completed shifts: In: -  Out: 2523 [Other:2523]   Intake/Output this shift:  No intake/output data recorded.  Physical Exam: General: No acute distress  Head: Normocephalic, atraumatic. Moist oral mucosal membranes  Eyes: Anicteric  Neck: Supple, trachea midline  Lungs:  Clear bilateral, normal effort  Heart: S1S2 no rubs  Abdomen:  Soft, nontender, bowel sounds present  Extremities: No peripheral edema.  Neurologic: Awake, alert, following commands  Skin: No lesions  Access:  Left temp HD catheter, RIJ permcath 5/14 Dr. Delana Meyer    Basic Metabolic Panel: Recent Labs  Lab 10/11/17 0448  10/12/17 0536 10/12/17 1437 10/13/17 1822 10/14/17 0444 10/14/17 1006 10/15/17 1046  NA 139  --  137  --  129* 130*  --  132*  K 3.6  --  3.5  --  3.1* 3.6  --  3.9  CL 107  --  102  --  90* 90*  --  94*  CO2 19*  --  25  --  26 25  --  28  GLUCOSE 90  --  87  --  135* 121*  --  183*  BUN 70*  --  48*  --  48* 59*  --  53*  CREATININE 11.09*  --  8.80*  --  7.67* 8.84*  --  6.67*  CALCIUM 7.6*  --  7.6*  --  8.3* 7.9*  --  8.1*  PHOS  --    < >  --  6.3* 7.3* 7.5* 8.3* 4.6   < > = values in this interval not displayed.    Liver Function Tests: Recent Labs  Lab 10/13/17 1822 10/14/17 0444 10/15/17 1046 10/16/17 1046  AST  --   --   --  23  ALT  --   --   --  14*  ALKPHOS  --   --   --   61  BILITOT  --   --   --  0.6  PROT  --   --   --  6.9  ALBUMIN 2.9* 2.6* 3.1* 3.3*   No results for input(s): LIPASE, AMYLASE in the last 168 hours. No results for input(s): AMMONIA in the last 168 hours.  CBC: Recent Labs  Lab 10/11/17 0448 10/14/17 0913 10/15/17 1046  WBC 7.5 12.0* 13.3*  NEUTROABS  --  11.2*  --   HGB 9.5* 9.3* 9.8*  HCT 27.3* 26.5* 28.4*  MCV 95.3 94.7 96.0  PLT 158 223 240    Cardiac Enzymes: Recent Labs  Lab 10/13/17 1822 10/14/17 0005 10/14/17 0444 10/14/17 1006  TROPONINI 0.08* 0.06* 0.05* 0.05*    BNP: Invalid input(s): POCBNP  CBG: Recent Labs  Lab 10/10/17 1335  GLUCAP 99    Microbiology: Results for orders placed or performed during the hospital encounter of 10/10/17  MRSA PCR Screening  Status: None   Collection Time: 10/10/17  2:00 PM  Result Value Ref Range Status   MRSA by PCR NEGATIVE NEGATIVE Final    Comment:        The GeneXpert MRSA Assay (FDA approved for NASAL specimens only), is one component of a comprehensive MRSA colonization surveillance program. It is not intended to diagnose MRSA infection nor to guide or monitor treatment for MRSA infections. Performed at [facility], [address] [place], [place] [address]     Coagulation Studies: No results for input(s): LABPROT, INR in the last 72 hours.  Urinalysis: No results for input(s): COLORURINE, LABSPEC, PHURINE, GLUCOSEU, HGBUR, BILIRUBINUR, KETONESUR, PROTEINUR, UROBILINOGEN, NITRITE, LEUKOCYTESUR in the last 72 hours.  Invalid input(s): APPERANCEUR    Imaging: No results found.   Medications:   . sodium chloride 20 mL/hr at 10/17/17 1122   . acetaminophen  650 mg Oral Once  . amLODipine  10 mg Oral Daily  . calcium acetate  1,334 mg Oral TID WC  . cloNIDine  0.1 mg Oral BID  . cyclophosphamide  600 mg Intravenous Once  . docusate sodium  100 mg Oral BID  . epoetin (EPOGEN/PROCRIT) injection  4,000 Units Intravenous Q  M,W,F-HD  . feeding supplement (NEPRO CARB STEADY)  237 mL Oral BID BM  . heparin  5,000 Units Subcutaneous Q8H  . hydrALAZINE  100 mg Oral Q8H  . mouth rinse  15 mL Mouth Rinse BID  . multivitamin  1 tablet Oral QHS  . nicotine  14 mg Transdermal Daily  . pantoprazole  40 mg Oral Daily  . predniSONE  80 mg Oral Q breakfast   acetaminophen **OR** acetaminophen, alum & mag hydroxide-simeth, bisacodyl, hydrALAZINE, ondansetron **OR** ondansetron (ZOFRAN) IV, oxyCODONE, senna-docusate  Assessment/ Plan:  [name] is a 32 y.o. white male with GERD who was admitted to [facility] on 10/10/2017 for evaluation of chest pain and shortness of breath.     1. Acute renal failure with hematuria and proteinuria: requiring hemodialysis. 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  Patient with cocaine and NSAID abuse.  Initiated on dialysis on 10/10/17.  Myeloperoxidase positive. Renal biopsy on 5/13.   - Hemodialysis on MWF schedule.  Tunneled catheter not functioning well so was given TPA. Will try again tomorrow before removing temp HD catheter.  - Transitioned to PO prednisone 80mg  daily for 1 month then start to taper.  - As per the RITUXVAS trial: will start rituximab IV weekly for four weeks and IV cyclophosphamide once every two weeks for two doses. Then will transition to azothioprine.   3.  Hypertension  - Continue amlodipine, clonidine, hydralazine.  4.  Anemia with renal failure: hemoglobin 9.8 - EPO with HD treatment.   5.  Secondary hyperparathyroidism with hyperphosphatemia and hypocalcemia. PTH 188. Dietary worked with patient today about renal failure diet.  Phosphorus and calcium at goal.  - Started calcium acetate on this admission.    LOS: 7 Jeffrey Holloway 5/16/20191:08 PM

## 2017-10-18 LAB — RENAL FUNCTION PANEL
ALBUMIN: 2.3 g/dL — AB (ref 3.5–5.0)
ANION GAP: 9 (ref 5–15)
BUN: 53 mg/dL — ABNORMAL HIGH (ref 6–20)
CALCIUM: 7.4 mg/dL — AB (ref 8.9–10.3)
CO2: 27 mmol/L (ref 22–32)
Chloride: 97 mmol/L — ABNORMAL LOW (ref 101–111)
Creatinine, Ser: 4.68 mg/dL — ABNORMAL HIGH (ref 0.61–1.24)
GFR, EST AFRICAN AMERICAN: 17 mL/min — AB (ref >60)
GFR, EST NON AFRICAN AMERICAN: 15 mL/min — AB (ref >60)
Glucose, Bld: 135 mg/dL — ABNORMAL HIGH (ref 65–99)
PHOSPHORUS: 1.9 mg/dL — AB (ref 2.5–4.6)
Potassium: 3.3 mmol/L — ABNORMAL LOW (ref 3.5–5.1)
SODIUM: 133 mmol/L — AB (ref 135–145)

## 2017-10-18 LAB — CBC
HCT: 23.2 % — ABNORMAL LOW (ref 40.0–52.0)
Hemoglobin: 8.2 g/dL — ABNORMAL LOW (ref 13.0–18.0)
MCH: 33.2 pg (ref 26.0–34.0)
MCHC: 35.1 g/dL (ref 32.0–36.0)
MCV: 94.5 fL (ref 80.0–100.0)
PLATELETS: 152 10*3/uL (ref 150–440)
RBC: 2.46 MIL/uL — AB (ref 4.40–5.90)
RDW: 13.5 % (ref 11.5–14.5)
WBC: 9.5 10*3/uL (ref 3.8–10.6)

## 2017-10-18 MED ORDER — PREDNISONE 20 MG PO TABS: 30.0000 mg | ORAL_TABLET | Freq: Every day | ORAL | 0 refills | Status: DC

## 2017-10-18 MED ORDER — LOSARTAN POTASSIUM 50 MG PO TABS
100.0000 mg | ORAL_TABLET | Freq: Every day | ORAL | Status: DC
  Administered 2017-10-18: 100 mg via ORAL
  Filled 2017-10-18: qty 2

## 2017-10-18 MED ORDER — AMLODIPINE BESYLATE 10 MG PO TABS: 10.0000 mg | ORAL_TABLET | Freq: Every day | ORAL | 0 refills | Status: DC

## 2017-10-18 MED ORDER — LOSARTAN POTASSIUM 100 MG PO TABS: 100.0000 mg | ORAL_TABLET | Freq: Every day | ORAL | 0 refills | Status: AC

## 2017-10-18 MED ORDER — OXYCODONE HCL 5 MG PO TABS: 5.0000 mg | ORAL_TABLET | Freq: Four times a day (QID) | ORAL | 0 refills | Status: DC | PRN

## 2017-10-18 MED ORDER — CLONIDINE HCL 0.1 MG PO TABS: 0.1000 mg | ORAL_TABLET | Freq: Two times a day (BID) | ORAL | 1 refills | Status: DC

## 2017-10-18 MED ORDER — NEPRO/CARBSTEADY PO LIQD: 237.0000 mL | Freq: Two times a day (BID) | ORAL | 0 refills | Status: AC

## 2017-10-18 MED ORDER — CALCIUM ACETATE (PHOS BINDER) 667 MG PO CAPS: 1334.0000 mg | ORAL_CAPSULE | Freq: Three times a day (TID) | ORAL | 0 refills | Status: AC

## 2017-10-18 MED ORDER — RENA-VITE PO TABS: 1.0000 | ORAL_TABLET | Freq: Every day | ORAL | 0 refills | Status: AC

## 2017-10-18 NOTE — Progress Notes (Signed)
[facility]  ROUNDING NOTE   Subjective:   Seen and examined on hemodialysis. Tolerating treatment well.   IV rituximab and cyclophosphamide yesterday     HEMODIALYSIS FLOWSHEET:  Blood Flow Rate (mL/min): 400 mL/min Arterial Pressure (mmHg): -160 mmHg Venous Pressure (mmHg): 150 mmHg Transmembrane Pressure (mmHg): 50 mmHg Ultrafiltration Rate (mL/min): 290 mL/min Dialysate Flow Rate (mL/min): 600 ml/min Conductivity: Machine : 14 Conductivity: Machine : 14 Dialysis Fluid Bolus: Normal Saline Bolus Amount (mL): 250 mL    Objective:  Vital signs in last 24 hours:  Temp:  [97.8 F (36.6 C)-98.3 F (36.8 C)] 97.8 F (36.6 C) (05/17 1430) Pulse Rate:  [69-88] 87 (05/17 1441) Resp:  [9-19] 12 (05/17 1441) BP: (141-175)/(83-111) 166/96 (05/17 1430) SpO2:  [97 %-98 %] 98 % (05/17 0412)  Weight change:  Filed Weights   10/14/17 0940  Weight: 79 kg (174 lb 2.6 oz)    Intake/Output: I/O last 3 completed shifts: In: 349.3 [I.V.:293.3; IV Piggyback:56] Out: 500 [Urine:500]   Intake/Output this shift:  Total I/O In: 240 [P.O.:240] Out: 500 [Other:500]  Physical Exam: General: No acute distress  Head: Normocephalic, atraumatic. Moist oral mucosal membranes  Eyes: Anicteric  Neck: Supple, trachea midline  Lungs:  Clear bilateral, normal effort  Heart: S1S2 no rubs  Abdomen:  Soft, nontender, bowel sounds present  Extremities: No peripheral edema.  Neurologic: Awake, alert, following commands  Skin: No lesions  Access:  Left temp HD catheter, RIJ permcath 5/14 Dr. Delana Holloway    Basic Metabolic Panel: Recent Labs  Lab 10/12/17 0536  10/13/17 1822 10/14/17 0444 10/14/17 1006 10/15/17 1046 10/18/17 1155  NA 137  --  129* 130*  --  132* 133*  K 3.5  --  3.1* 3.6  --  3.9 3.3*  CL 102  --  90* 90*  --  94* 97*  CO2 25  --  26 25  --  28 27  GLUCOSE 87  --  135* 121*  --  183* 135*  BUN 48*  --  48* 59*  --  53* 53*  CREATININE 8.80*  --  7.67*  8.84*  --  6.67* 4.68*  CALCIUM 7.6*  --  8.3* 7.9*  --  8.1* 7.4*  PHOS  --    < > 7.3* 7.5* 8.3* 4.6 1.9*   < > = values in this interval not displayed.    Liver Function Tests: Recent Labs  Lab 10/13/17 1822 10/14/17 0444 10/15/17 1046 10/16/17 1046 10/18/17 1155  AST  --   --   --  23  --   ALT  --   --   --  14*  --   ALKPHOS  --   --   --  61  --   BILITOT  --   --   --  0.6  --   PROT  --   --   --  6.9  --   ALBUMIN 2.9* 2.6* 3.1* 3.3* 2.3*   No results for input(s): LIPASE, AMYLASE in the last 168 hours. No results for input(s): AMMONIA in the last 168 hours.  CBC: Recent Labs  Lab 10/14/17 0913 10/15/17 1046 10/18/17 1155  WBC 12.0* 13.3* 9.5  NEUTROABS 11.2*  --   --   HGB 9.3* 9.8* 8.2*  HCT 26.5* 28.4* 23.2*  MCV 94.7 96.0 94.5  PLT 223 240 152    Cardiac Enzymes: Recent Labs  Lab 10/13/17 1822 10/14/17 0005 10/14/17 0444 10/14/17 1006  TROPONINI 0.08*  0.06* 0.05* 0.05*    BNP: Invalid input(s): POCBNP  CBG: No results for input(s): GLUCAP in the last 168 hours.  Microbiology: Results for orders placed or performed during the hospital encounter of 10/10/17  MRSA PCR Screening     Status: None   Collection Time: 10/10/17  2:00 PM  Result Value Ref Range Status   MRSA by PCR NEGATIVE NEGATIVE Final    Comment:        The GeneXpert MRSA Assay (FDA approved for NASAL specimens only), is one component of a comprehensive MRSA colonization surveillance program. It is not intended to diagnose MRSA infection nor to guide or monitor treatment for MRSA infections. Performed at [facility], [address] [place], [place] [address]     Coagulation Studies: No results for input(s): LABPROT, INR in the last 72 hours.  Urinalysis: No results for input(s): COLORURINE, LABSPEC, PHURINE, GLUCOSEU, HGBUR, BILIRUBINUR, KETONESUR, PROTEINUR, UROBILINOGEN, NITRITE, LEUKOCYTESUR in the last 72 hours.  Invalid input(s): APPERANCEUR     Imaging: No results found.   Medications:    . acetaminophen  650 mg Oral Once  . amLODipine  10 mg Oral Daily  . calcium acetate  1,334 mg Oral TID WC  . cloNIDine  0.1 mg Oral BID  . docusate sodium  100 mg Oral BID  . epoetin (EPOGEN/PROCRIT) injection  4,000 Units Intravenous Q M,W,F-HD  . feeding supplement (NEPRO CARB STEADY)  237 mL Oral BID BM  . heparin  5,000 Units Subcutaneous Q8H  . hydrALAZINE  100 mg Oral Q8H  . losartan  100 mg Oral Daily  . mouth rinse  15 mL Mouth Rinse BID  . multivitamin  1 tablet Oral QHS  . nicotine  14 mg Transdermal Daily  . pantoprazole  40 mg Oral Daily  . predniSONE  80 mg Oral Q breakfast   acetaminophen **OR** acetaminophen, alum & mag hydroxide-simeth, bisacodyl, hydrALAZINE, ondansetron **OR** ondansetron (ZOFRAN) IV, oxyCODONE, senna-docusate  Assessment/ Plan:  [name] is a 32 y.o. white male with GERD who was admitted to [facility] on 10/10/2017 for evaluation of chest pain and shortness of breath.     1. Acute renal failure with hematuria and proteinuria: requiring hemodialysis. 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  Patient with cocaine and NSAID abuse.  Initiated on dialysis on 10/10/17.  Myeloperoxidase positive. Renal biopsy on 5/13.   - Hemodialysis on MWF schedule.  Tunneled catheter functioning - Will remove temp left IJ catheter.  - Transitioned to PO prednisone 80mg  daily for 1 month then start to taper.  - As per the RITUXVAS trial: will start rituximab IV weekly for four weeks and IV cyclophosphamide once every two weeks for two doses. Then will transition to azothioprine.   3.  Hypertension  - Continue amlodipine, clonidine, hydralazine.  4.  Anemia with renal failure: hemoglobin 8.2 - EPO with HD treatment.   5.  Secondary hyperparathyroidism with hyperphosphatemia and hypocalcemia. PTH 188. Dietary worked with patient today about renal failure diet.  Phosphorus and calcium at  goal.  - Started calcium acetate on this admission.    LOS: 8 Jeffrey Holloway 5/17/20195:40 PM

## 2017-10-18 NOTE — Progress Notes (Signed)
Possible discharge later today after hemodialysis.  Instructions on the computer.  Medicines for high blood pressure, pain or printed.  Patient needs appointment at cancer center next Thursday for further chemotherapy, follow-up with Dr. Anthonette Legato on Monday in the office, patient has await appointment Monday, Wednesday, Friday. dis Cussed the plan with patient, answered all the questions.

## 2017-10-18 NOTE — Progress Notes (Signed)
   10/18/17 1035  Clinical Encounter Type  Visited With Patient not available  Visit Type Initial  Referral From Nurse  Consult/Referral To Chaplain  Spiritual Encounters  Spiritual Needs Prayer   [place] attempted to check on PT at 1035 am. PT was asleep and RM was dark. [place] informed the nurse of the attempted visit and will follow up as needed.

## 2017-10-18 NOTE — Progress Notes (Signed)
Left central line removed. IV removed. Portacath for dialysis are clean dry and intact prior to discharge.  Discharge instructions provided and reviewed. Paper copy of prescriptions provided. Pt transitioning care home. Pt spouse providing transportation. No remianing questions at time of discharge.

## 2017-10-18 NOTE — Plan of Care (Signed)
  Problem: Education: Goal: Knowledge of General Education information will improve Outcome: Progressing   Problem: Health Behavior/Discharge Planning: Goal: Ability to manage health-related needs will improve Outcome: Progressing   Problem: Clinical Measurements: Goal: Ability to maintain clinical measurements within normal limits will improve Outcome: Progressing Goal: Will remain free from infection Outcome: Progressing Goal: Diagnostic test results will improve Outcome: Progressing Goal: Respiratory complications will improve Outcome: Progressing Goal: Cardiovascular complication will be avoided Outcome: Progressing   Problem: Activity: Goal: Risk for activity intolerance will decrease Outcome: Progressing   Problem: Nutrition: Goal: Adequate nutrition will be maintained Outcome: Progressing   Problem: Coping: Goal: Level of anxiety will decrease Outcome: Progressing   Problem: Elimination: Goal: Will not experience complications related to bowel motility Outcome: Progressing Goal: Will not experience complications related to urinary retention Outcome: Progressing   Problem: Pain Managment: Goal: General experience of comfort will improve Outcome: Progressing   Problem: Safety: Goal: Ability to remain free from injury will improve Outcome: Progressing   Problem: Skin Integrity: Goal: Risk for impaired skin integrity will decrease Outcome: Progressing   Problem: Education: Goal: Knowledge of the prescribed therapeutic regimen will improve Outcome: Progressing

## 2017-10-18 NOTE — Progress Notes (Signed)
This note also relates to the following rows which could not be included: Pulse Rate - Cannot attach notes to unvalidated device data Resp - Cannot attach notes to unvalidated device data BP - Cannot attach notes to unvalidated device data  Hd completed  

## 2017-10-18 NOTE — Progress Notes (Signed)
This note also relates to the following rows which could not be included: Pulse Rate - Cannot attach notes to unvalidated device data Resp - Cannot attach notes to unvalidated device data BP - Cannot attach notes to unvalidated device data  Hd started  

## 2017-10-20 DIAGNOSIS — N057 Unspecified nephritic syndrome with diffuse crescentic glomerulonephritis: Secondary | ICD-10-CM | POA: Insufficient documentation

## 2017-10-20 NOTE — Progress Notes (Signed)
[place]  [phone] [phone]  ID: Jeannie Fend OB: [birth year]  MR#: [id number]  DSK#:[id number]  Patient Care Team: Patient, No Pcp Per as PCP - General (General Practice)  CHIEF COMPLAINT: Crescentic glomerulonephritis.  INTERVAL HISTORY: Patient returns to clinic today for further evaluation and consideration of week 2 of 4 of Rituxan and Cytoxan.  Rituxan only today.  He tolerated his first infusion well without significant side effects.  He currently feels well and is at his baseline. He has no neurologic complaints.  He denies any recent fevers or illnesses.  He has a good appetite and denies weight loss.  He has no chest pain or shortness of breath.  He denies any nausea, vomiting, constipation, or diarrhea.    Patient offers no specific complaints today.  REVIEW OF SYSTEMS:   Review of Systems  Constitutional: Negative.  Negative for fever and weight loss.  Respiratory: Negative.  Negative for cough and shortness of breath.   Cardiovascular: Negative.  Negative for chest pain and leg swelling.  Gastrointestinal: Negative.  Negative for abdominal pain.  Genitourinary: Negative.  Negative for flank pain.  Musculoskeletal: Negative.  Negative for back pain.  Skin: Negative.  Negative for rash.  Neurological: Negative.  Negative for sensory change, focal weakness and weakness.  Psychiatric/Behavioral: The patient is not nervous/anxious.     As per HPI. Otherwise, a complete review of systems is negative.  PAST MEDICAL HISTORY: Past Medical History:  Diagnosis Date  . Constipation   . Diarrhea   . Glomerulonephritis   . Hemorrhoids   . Kidney infection   . Nausea and vomiting   . Occult blood in stools   . Shortness of breath   . Sleep apnea     PAST SURGICAL HISTORY: Past Surgical History:  Procedure Laterality Date  . APPENDECTOMY    . DIALYSIS/PERMA CATHETER INSERTION N/A 10/15/2017   Procedure: DIALYSIS/PERMA  CATHETER INSERTION;  Surgeon: Katha Cabal, MD;  Location: [facility];  Service: Cardiovascular;  Laterality: N/A;    FAMILY HISTORY: History reviewed. No pertinent family history.  ADVANCED DIRECTIVES (Y/N):  N  HEALTH MAINTENANCE: Social History   Tobacco Use  . Smoking status: Current Every Day Smoker    Packs/day: 1.00    Types: Cigarettes  . Smokeless tobacco: Never Used  Substance Use Topics  . Alcohol use: Yes  . Drug use: Yes    Types: Marijuana, Cocaine     Colonoscopy:  PAP:  Bone density:  Lipid panel:  No Known Allergies  Current Outpatient Medications  Medication Sig Dispense Refill  . amLODipine (NORVASC) 10 MG tablet Take 1 tablet (10 mg total) by mouth daily. 30 tablet 0  . calcium acetate (PHOSLO) 667 MG capsule Take 2 capsules (1,334 mg total) by mouth 3 (three) times daily with meals. 60 capsule 0  . calcium carbonate (TUMS - DOSED IN MG ELEMENTAL CALCIUM) 500 MG chewable tablet Chew 1 tablet by mouth 2 (two) times daily.    . cloNIDine (CATAPRES) 0.1 MG tablet Take 1 tablet (0.1 mg total) by mouth 2 (two) times daily. 60 tablet 1  . losartan (COZAAR) 100 MG tablet Take 1 tablet (100 mg total) by mouth daily. 30 tablet 0  . multivitamin (RENA-VIT) TABS tablet Take 1 tablet by mouth at bedtime. 30 tablet 0  . Nutritional Supplements (FEEDING SUPPLEMENT, NEPRO CARB STEADY,) LIQD Take 237 mLs by mouth 2 (two) times daily between meals. 30 Can 0  . omeprazole (  PRILOSEC) 20 MG capsule Take 20 mg by mouth daily.    Marland Kitchen oxyCODONE (OXY IR/ROXICODONE) 5 MG immediate release tablet Take 1-2 tablets (5-10 mg total) by mouth every 6 (six) hours as needed for moderate pain. 30 tablet 0  . predniSONE (DELTASONE) 20 MG tablet Take 1.5 tablets (30 mg total) by mouth daily with breakfast. 30 tablet 0   No current facility-administered medications for this visit.     OBJECTIVE: Vitals:   10/24/17 0940  BP: (!) 160/104  Pulse: 71  Resp: 20  Temp: 97.6  F (36.4 C)     Body mass index is 27.15 kg/m.    ECOG FS:0 - Asymptomatic  General: Well-developed, well-nourished, no acute distress. Eyes: Pink conjunctiva, anicteric sclera. HEENT: Normocephalic, moist mucous membranes, clear oropharnyx. Lungs: Clear to auscultation bilaterally. Heart: Regular rate and rhythm. No rubs, murmurs, or gallops. Abdomen: Soft, nontender, nondistended. No organomegaly noted, normoactive bowel sounds. Musculoskeletal: No edema, cyanosis, or clubbing. Neuro: Alert, answering all questions appropriately. Cranial nerves grossly intact. Skin: No rashes or petechiae noted. Psych: Normal affect. Lymphatics: No cervical, calvicular, axillary or inguinal LAD.   LAB RESULTS:  Lab Results  Component Value Date   NA 133 (L) 10/18/2017   K 3.3 (L) 10/18/2017   CL 97 (L) 10/18/2017   CO2 27 10/18/2017   GLUCOSE 135 (H) 10/18/2017   BUN 53 (H) 10/18/2017   CREATININE 4.68 (H) 10/18/2017   CALCIUM 7.4 (L) 10/18/2017   PROT 6.9 10/16/2017   ALBUMIN 2.3 (L) 10/18/2017   AST 23 10/16/2017   ALT 14 (L) 10/16/2017   ALKPHOS 61 10/16/2017   BILITOT 0.6 10/16/2017   GFRNONAA 15 (L) 10/18/2017   GFRAA 17 (L) 10/18/2017    Lab Results  Component Value Date   WBC 9.5 10/18/2017   NEUTROABS 11.2 (H) 10/14/2017   HGB 8.2 (L) 10/18/2017   HCT 23.2 (L) 10/18/2017   MCV 94.5 10/18/2017   PLT 152 10/18/2017     STUDIES: Dg Chest 2 View  Result Date: 10/10/2017 CLINICAL DATA:  32 year old male with chest pain. EXAM: CHEST - 2 VIEW COMPARISON:  None. FINDINGS: There is diffuse interstitial streaky densities and nodularity as well as Kerley B-lines. Findings consistent with interstitial edema. Superimposed pneumonia is not entirely excluded. Clinical correlation is recommended. There are probably trace bilateral pleural effusions. There is no focal consolidation or pneumothorax. The cardiac silhouette is within normal limits. No acute osseous pathology. IMPRESSION:  Interstitial edema. Superimposed pneumonia is not excluded. Clinical correlation is recommended. Electronically Signed   By: Anner Crete M.D.   On: 10/10/2017 05:03   US Renal  Result Date: 10/10/2017 CLINICAL DATA:  Renal failure EXAM: RENAL / URINARY TRACT ULTRASOUND COMPLETE COMPARISON:  CT of the abdomen and pelvis FINDINGS: Right Kidney: Length: 12 cm. Mild increased echogenicity is noted. No hydronephrosis or mass lesion is noted. Left Kidney: Length: 12.3 cm. Mild increased echogenicity is noted without evidence of mass effect. Bladder: Appears normal for degree of bladder distention. IMPRESSION: Mild increased echogenicity bilaterally. No obstructive changes are seen. Electronically Signed   By: Inez Catalina M.D.   On: 10/10/2017 17:31   Dg Chest Port 1 View  Result Date: 10/10/2017 CLINICAL DATA:  Encounter for central line placement. EXAM: PORTABLE CHEST 1 VIEW COMPARISON:  Portable film earlier today. FINDINGS: The heart is enlarged. Perihilar, RIGHT greater than LEFT infiltrates are increased. RIGHT pleural effusion. Dual lumen catheter has been placed from a LEFT IJ approach. Tip lies in  the mid to distal SVC. There is no pneumothorax. IMPRESSION: Worsening aeration, particularly on the RIGHT. Dual lumen catheter tip mid to distal SVC. LEFT IJ approach. No pneumothorax. Electronically Signed   By: Staci Righter M.D.   On: 10/10/2017 15:17   Ct Renal Stone Study  Result Date: 10/10/2017 CLINICAL DATA:  Initial evaluation for acute bilateral flank pain. EXAM: CT ABDOMEN AND PELVIS WITHOUT CONTRAST TECHNIQUE: Multidetector CT imaging of the abdomen and pelvis was performed following the standard protocol without IV contrast. COMPARISON:  Prior CT from 01/17/2012. FINDINGS: Lower chest: Layering bilateral pleural effusions. Diffuse interlobular septal thickening at the lung bases consistent with interstitial edema. Superimposed atelectatic changes. Hepatobiliary: Limited noncontrast evaluation  of the liver is unremarkable. Gallbladder within normal limits. No biliary dilatation. Pancreas: Pancreas within normal limits. Spleen: Spleen within normal limits. Adrenals/Urinary Tract: Adrenal glands are normal. Kidneys equal in size without evidence for nephrolithiasis or hydronephrosis. No radiopaque calculi seen along the course of either renal collecting system. No hydroureter. Partially distended bladder within normal limits. No layering stones within the bladder lumen. Stomach/Bowel: Stomach within normal limits. No evidence for bowel obstruction. Appendix is absent. Mild colonic diverticulosis without evidence for acute diverticulitis. No acute inflammatory changes seen about the bowels. Vascular/Lymphatic: Intra-abdominal aorta of normal caliber. No adenopathy. Reproductive: Prostate normal. Other: No free air or fluid. Musculoskeletal: No acute osseous abnormality. No worrisome lytic or blastic osseous lesions. IMPRESSION: 1. No CT evidence for nephrolithiasis or obstructive uropathy. No other acute intra-abdominal or pelvic process. 2. Small moderate layering bilateral pleural effusions with evidence of pulmonary interstitial edema. 3. Mild colonic diverticulosis without evidence for acute diverticulitis. 4. Sequelae of prior appendectomy. Electronically Signed   By: Jeannine Boga M.D.   On: 10/10/2017 07:13   US Biopsy (kidney)  Result Date: 10/14/2017 INDICATION: 32 year old with acute renal failure and on hemodialysis. 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 Renal biopsy has been requested. EXAM: ULTRASOUND-GUIDED RANDOM LEFT RENAL BIOPSY MEDICATIONS: None. ANESTHESIA/SEDATION: Moderate (conscious) sedation was employed during this procedure. A total of Versed 4.5 mg and Fentanyl 200 mcg was administered intravenously. Moderate Sedation Time: 29 minutes. The patient's level of consciousness and vital signs were monitored continuously by radiology nursing throughout the procedure under my direct  supervision. FLUOROSCOPY TIME:  None COMPLICATIONS: None immediate. PROCEDURE: Informed written consent was obtained from the patient after a thorough discussion of the procedural risks, benefits and alternatives. All questions were addressed. A timeout was performed prior to the initiation of the procedure. Patient was placed prone. Both kidneys were evaluated with ultrasound. The left kidney was selected for biopsy. The left flank was prepped with chlorhexidine and a sterile field was created. Skin and soft tissues were anesthetized with 1% lidocaine. Coaxial needle was directed towards the left kidney lower pole with ultrasound guidance. 70 gauge core needle was then used to biopsy the left kidney lower pole. The first core biopsy had scant material. Therefore, 2 additional ultrasound-guided core biopsies were obtained with the 16 gauge device. The second and third core biopsies were adequate and placed on a Telfa pad with saline. The coaxial needle was removed. Bandage placed over the puncture site. FINDINGS: Increased echogenicity in the kidneys. No hydronephrosis. Core biopsies obtained from the left kidney lower pole. No significant bleeding or hematoma formation following the core biopsies. IMPRESSION: Ultrasound-guided core biopsies of the left kidney lower pole. Electronically Signed   By: Markus Daft M.D.   On: 10/14/2017 16:48    ASSESSMENT: Crescentic glomerulonephritis.  PLAN:  1.  Crescentic glomerular nephritis: Proceed with cycle 2 of 4 of weekly Rituxan Cytoxan.  Rituxan only today.  Plan is to receive weekly IV rituximab 800 mg (rounded from 750 mg or 375 mg/m) and q2 week IV cyclophosphamide 600 mg ( or 300 mg/m).    Patient expressed understanding that all laboratory work will be monitored by nephrology.  He also expressed understanding that any questions or concerns he has with his diagnosis or treatment should be directed towards nephrology as well.  Return to clinic in 1 week for  further evaluation and consideration of cycle 3 which will be both Rituxan and Cytoxan.  Approximately 30 minutes was spent in discussion of which greater than 50% was consultation.   Patient expressed understanding and was in agreement with this plan. He also understands that He can call clinic at any time with any questions, concerns, or complaints.   Cancer Staging No matching staging information was found for the patient.  Lloyd Huger, MD   10/29/2017 1:14 PM

## 2017-10-21 ENCOUNTER — Encounter: Payer: Self-pay | Admitting: Nephrology

## 2017-10-21 ENCOUNTER — Other Ambulatory Visit: Payer: Self-pay | Admitting: Oncology

## 2017-10-21 DIAGNOSIS — N057 Unspecified nephritic syndrome with diffuse crescentic glomerulonephritis: Secondary | ICD-10-CM

## 2017-10-21 LAB — SURGICAL PATHOLOGY

## 2017-10-22 ENCOUNTER — Telehealth: Payer: Self-pay

## 2017-10-22 NOTE — Telephone Encounter (Signed)
EMMI Follow-up: Noted on the report that the patient wanted to know who to contact if there is a change in his condition.  Left VM for [name] and will follow-up again.

## 2017-10-24 ENCOUNTER — Inpatient Hospital Stay: Payer: BLUE CROSS/BLUE SHIELD

## 2017-10-24 ENCOUNTER — Inpatient Hospital Stay: Payer: BLUE CROSS/BLUE SHIELD | Attending: Oncology | Admitting: Oncology

## 2017-10-24 ENCOUNTER — Encounter: Payer: Self-pay | Admitting: Oncology

## 2017-10-24 ENCOUNTER — Telehealth: Payer: Self-pay

## 2017-10-24 DIAGNOSIS — Z79899 Other long term (current) drug therapy: Secondary | ICD-10-CM | POA: Diagnosis not present

## 2017-10-24 DIAGNOSIS — N057 Unspecified nephritic syndrome with diffuse crescentic glomerulonephritis: Secondary | ICD-10-CM

## 2017-10-24 DIAGNOSIS — G4730 Sleep apnea, unspecified: Secondary | ICD-10-CM | POA: Diagnosis not present

## 2017-10-24 DIAGNOSIS — N179 Acute kidney failure, unspecified: Secondary | ICD-10-CM | POA: Diagnosis not present

## 2017-10-24 DIAGNOSIS — F1410 Cocaine abuse, uncomplicated: Secondary | ICD-10-CM | POA: Insufficient documentation

## 2017-10-24 DIAGNOSIS — F17210 Nicotine dependence, cigarettes, uncomplicated: Secondary | ICD-10-CM | POA: Insufficient documentation

## 2017-10-24 DIAGNOSIS — Z5112 Encounter for antineoplastic immunotherapy: Secondary | ICD-10-CM | POA: Insufficient documentation

## 2017-10-24 DIAGNOSIS — Z5111 Encounter for antineoplastic chemotherapy: Secondary | ICD-10-CM | POA: Insufficient documentation

## 2017-10-24 DIAGNOSIS — F1210 Cannabis abuse, uncomplicated: Secondary | ICD-10-CM | POA: Diagnosis not present

## 2017-10-24 DIAGNOSIS — Z992 Dependence on renal dialysis: Secondary | ICD-10-CM | POA: Insufficient documentation

## 2017-10-24 DIAGNOSIS — Z7952 Long term (current) use of systemic steroids: Secondary | ICD-10-CM | POA: Diagnosis not present

## 2017-10-24 MED ORDER — DIPHENHYDRAMINE HCL 25 MG PO CAPS
25.0000 mg | ORAL_CAPSULE | Freq: Once | ORAL | Status: AC
  Administered 2017-10-24: 25 mg via ORAL
  Filled 2017-10-24: qty 1

## 2017-10-24 MED ORDER — SODIUM CHLORIDE 0.9 % IV SOLN
Freq: Once | INTRAVENOUS | Status: AC
  Administered 2017-10-24: 10:00:00 via INTRAVENOUS
  Filled 2017-10-24: qty 1000

## 2017-10-24 MED ORDER — SODIUM CHLORIDE 0.9 % IV SOLN: 375.0000 mg/m2 | Freq: Once | INTRAVENOUS | Status: DC

## 2017-10-24 MED ORDER — SODIUM CHLORIDE 0.9 % IV SOLN
375.0000 mg/m2 | Freq: Once | INTRAVENOUS | Status: AC
  Administered 2017-10-24: 800 mg via INTRAVENOUS
  Filled 2017-10-24: qty 50

## 2017-10-24 MED ORDER — ACETAMINOPHEN 325 MG PO TABS
650.0000 mg | ORAL_TABLET | Freq: Once | ORAL | Status: AC
  Administered 2017-10-24: 650 mg via ORAL
  Filled 2017-10-24: qty 2

## 2017-10-24 NOTE — Telephone Encounter (Signed)
CSW contacted patient for EMMI call due to response of feeling sad and hopeless. CSW introduced self and explained the reason for the call. Patient states that he pressed the wrong button and did not intend to state that he feels sad. CSW asked patient if he is having feelings of sadness or hopelessness and patient reports that he is not. CSW asked if patient does feel like harming himself or others and patient states that he is not. He is following up with his PCP as scheduled and has no other concerns.   Annamaria Boots MSW, [place] [phone]

## 2017-10-24 NOTE — Progress Notes (Signed)
Patient here today for initial visit regarding Rituxan and Cytoxan infusion for glomerulonephritis.

## 2017-10-25 NOTE — Discharge Summary (Signed)
Jeffrey Holloway, is a 32 y.o. male  DOB 19-Dec-1985  MRN [id number].  Admission date:  10/10/2017  Admitting Physician  Arta Silence, MD  Discharge Date:  10/18/2017   Primary MD  Patient, No Pcp Per  Recommendations for primary care physician for things to follow:   Follow with PCP in 1 week Follow-up oncology in 6 days that is Thursday for further chemotherapy Follow-up with Dr. Anthonette Legato for regular hemodialysis.  And so follow-up with Dr. Anthonette Legato for chronic kidney problem  Admission Diagnosis  Acute renal failure, unspecified acute renal failure type [facility]) [N17.9]   Discharge Diagnosis  Acute renal failure, unspecified acute renal failure type ([place]) [N17.9]    Active Problems:   Acute renal failure (ARF) (HCC)   GERD (gastroesophageal reflux disease)      Past Medical History:  Diagnosis Date  . Constipation   . Diarrhea   . Glomerulonephritis   . Hemorrhoids   . Kidney infection   . Nausea and vomiting   . Occult blood in stools   . Shortness of breath   . Sleep apnea     Past Surgical History:  Procedure Laterality Date  . APPENDECTOMY    . DIALYSIS/PERMA CATHETER INSERTION N/A 10/15/2017   Procedure: DIALYSIS/PERMA CATHETER INSERTION;  Surgeon: Katha Cabal, MD;  Location: [place] [facility];  Service: Cardiovascular;  Laterality: N/A;       History of present illness and  Hospital Course:     Kindly see H&P for history of present illness and admission details, please review complete Labs, Consult reports and Test reports for all details in brief  HPI  from the history and physical done on the day of admission 32 year old male patient with no past medical history admitted because of lethargy, patient sleeping all the time at home as per wife.  Patient also mentioned that  having leg cramps for 3 days.  According to wife patient also had some chest pain, shortness of breath that got worse in the last 12 hours.  Also noticed decrease in urine quantity, urine became dark red discolored.  Admitted because of oliguric renal failure, volume overload, hypertensive emergency, BP 190/130 when he came.  Patient admitted to stepdown unit. Hospital Course  #1 acute renal failure versus ESRD: Patient most recent creatinine in 2013 was 0.9.  And when he came creatinine is 13.  Patient had uremic symptoms, volume overload on admission.  According to documentation patient was taking NSAID's at least every other day.  Patient seen by nephrology, patient had temporary hemodialysis catheter, started on hemodialysis on May 9.  We also sent blood work for SPEP, UPEP, ANA, ANCA antibodies, GBM antibodies, C3, C4, hepatitis C serology.  Patient also had a kidney biopsy.  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  Patient started on high-dose steroids and also IV cyclophosphamide, Rituxan.  Seen by hematology Dr. Grayland Ormond.  Patient to get weekly Rituxan, 2 weekly cyclophosphamide as per nephrology, hematology recommendation.  Patient received IV Solu-Medrol 1 g for 3 days, started on high-dose prednisone, discharged home with prednisone 80 mg daily, according to my discussion with the pathology with the recommended prednisone 80 mg daily for 1 month and then start to taper slowly.  Patient also has positive myeloperoxidase.  Started on hemodialysis, patient received tunneled dialysis catheter by vascular.  Removed temporary hemodialysis catheter.  Arranged outpatient hemodialysis on Monday, Wednesday, Friday schedule at [place].   #  2 acute respiratory failure secondary to pulmonary edema, initially O2 sat dropped to 89% on 2 L, increased oxygen to 4 L.,  But he also received BiPAP temporarily. Patient respiratory status  improved with dialysis so he is off oxygen at the time of discharge.  #3. hypertensive emergency on arrival, patient has BP stayed around 200 so started on nicardipine drip.  Patient on nicardipine drip for at least 24 hours, blood pressure improved with hemodialysis and also starting oral antihypertensives.  Patient is given amlodipine, hydralazine, clonidine.  We will provide a prescription for amlodipine, hydralazine, clonidine.  We avoided beta-blocker because of cocaine thoughts positive in the urine.  4.  Anterolateral chest pain, EKG on admission showed normal sinus rhythm with anterolateral ST-T changes.  Seen by cardiology Dr. Ubaldo Glassing.  Troponin slightly up at 0.12, seen by cardiology, we ordered echocardiogram.  Patient echocardiogram showed ejection fraction 60 to 65%.  Cocaine positive in the urine: We avoided beta-blocker.  #5 anemia with renal failure with hemoglobin 8.2, patient to get Epogen with hemodialysis treatment  6.  Secondary hyperparathyroidism with hyperphosphatemia and hypocalcemia, dietary worked with patient to get renal failure diet, started on calcium acetate.  #7 acute gastritis because of high-dose steroids, improved with IV PPI, patient can continue oral PPI at discharge.  #8 pain at [facility] site.,  Biopsy site patient with left kidney biopsy: Received a limited prescription of oxycodone at discharge.  Discharge Condition: Stable High risk for recurrent admission  Follow UP  Follow-up Information    Schnier, Dolores Lory, MD. Go on 12/02/2017.   Specialties:  Vascular Surgery, Cardiology, Radiology, Vascular Surgery Why:  with vein mapping first AV access @ 9:00 a.m. Contact information: [place] [address] [phone]        Lloyd Huger, MD. Go on 10/24/2017.   Specialty:  Oncology Why:  @ 8:45 - Call [phone] if questions Contact information: [place] [address] [phone]        Anthonette Legato, MD Follow up in 3 day(s).   Specialty:  Internal Medicine Contact information: [place] [address] [phone]             Discharge Instructions  and  Discharge Medications   Discharge instructions, appointments and his diagnosis completely discussed with patient and his wife.   Allergies as of 10/18/2017   No Known Allergies     Medication List    STOP taking these medications   ibuprofen 200 MG tablet Commonly known as:  ADVIL,MOTRIN   traMADol 50 MG tablet Commonly known as:  ULTRAM     TAKE these medications   amLODipine 10 MG tablet Commonly known as:  NORVASC Take 1 tablet (10 mg total) by mouth daily.   calcium acetate 667 MG capsule Commonly known as:  PHOSLO Take 2 capsules (1,334 mg total) by mouth 3 (three) times daily with meals.   calcium carbonate 500 MG chewable tablet Commonly known as:  TUMS - dosed in mg elemental calcium Chew 1 tablet by mouth 2 (two) times daily.   cloNIDine 0.1 MG tablet Commonly known as:  CATAPRES Take 1 tablet (0.1 mg total) by mouth 2 (two) times daily.   feeding supplement (NEPRO CARB STEADY) Liqd Take 237 mLs by mouth 2 (two) times daily between meals.   losartan 100 MG tablet Commonly known as:  COZAAR Take 1 tablet (100 mg total) by mouth daily.   multivitamin Tabs tablet Take  1 tablet by mouth at bedtime.   omeprazole 20 MG capsule Commonly known as:  PRILOSEC Take 20 mg by mouth daily.   oxyCODONE 5 MG immediate release tablet Commonly known as:  Oxy IR/ROXICODONE Take 1-2 tablets (5-10 mg total) by mouth every 6 (six) hours as needed for moderate pain.   predniSONE 20 MG tablet Commonly known as:  DELTASONE Take 1.5 tablets (30 mg total) by mouth daily with breakfast.         Diet and Activity recommendation: See Discharge Instructions above   Consults obtained -nephrology, cardiology, vascular, intensivist, hematology oncology, dietitian   Major  procedures and Radiology Reports - PLEASE review detailed and final reports for all details, in brief -      Dg Chest 2 View  Result Date: 10/10/2017 CLINICAL DATA:  32 year old male with chest pain. EXAM: CHEST - 2 VIEW COMPARISON:  None. FINDINGS: There is diffuse interstitial streaky densities and nodularity as well as Kerley B-lines. Findings consistent with interstitial edema. Superimposed pneumonia is not entirely excluded. Clinical correlation is recommended. There are probably trace bilateral pleural effusions. There is no focal consolidation or pneumothorax. The cardiac silhouette is within normal limits. No acute osseous pathology. IMPRESSION: Interstitial edema. Superimposed pneumonia is not excluded. Clinical correlation is recommended. Electronically Signed   By: Anner Crete M.D.   On: 10/10/2017 05:03   US Renal  Result Date: 10/10/2017 CLINICAL DATA:  Renal failure EXAM: RENAL / URINARY TRACT ULTRASOUND COMPLETE COMPARISON:  CT of the abdomen and pelvis FINDINGS: Right Kidney: Length: 12 cm. Mild increased echogenicity is noted. No hydronephrosis or mass lesion is noted. Left Kidney: Length: 12.3 cm. Mild increased echogenicity is noted without evidence of mass effect. Bladder: Appears normal for degree of bladder distention. IMPRESSION: Mild increased echogenicity bilaterally. No obstructive changes are seen. Electronically Signed   By: Inez Catalina M.D.   On: 10/10/2017 17:31   Dg Chest Port 1 View  Result Date: 10/10/2017 CLINICAL DATA:  Encounter for central line placement. EXAM: PORTABLE CHEST 1 VIEW COMPARISON:  Portable film earlier today. FINDINGS: The heart is enlarged. Perihilar, RIGHT greater than LEFT infiltrates are increased. RIGHT pleural effusion. Dual lumen catheter has been placed from a LEFT IJ approach. Tip lies in the mid to distal SVC. There is no pneumothorax. IMPRESSION: Worsening aeration, particularly on the RIGHT. Dual lumen catheter tip mid to distal SVC.  LEFT IJ approach. No pneumothorax. Electronically Signed   By: Staci Righter M.D.   On: 10/10/2017 15:17   Ct Renal Stone Study  Result Date: 10/10/2017 CLINICAL DATA:  Initial evaluation for acute bilateral flank pain. EXAM: CT ABDOMEN AND PELVIS WITHOUT CONTRAST TECHNIQUE: Multidetector CT imaging of the abdomen and pelvis was performed following the standard protocol without IV contrast. COMPARISON:  Prior CT from 01/17/2012. FINDINGS: Lower chest: Layering bilateral pleural effusions. Diffuse interlobular septal thickening at the lung bases consistent with interstitial edema. Superimposed atelectatic changes. Hepatobiliary: Limited noncontrast evaluation of the liver is unremarkable. Gallbladder within normal limits. No biliary dilatation. Pancreas: Pancreas within normal limits. Spleen: Spleen within normal limits. Adrenals/Urinary Tract: Adrenal glands are normal. Kidneys equal in size without evidence for nephrolithiasis or hydronephrosis. No radiopaque calculi seen along the course of either renal collecting system. No hydroureter. Partially distended bladder within normal limits. No layering stones within the bladder lumen. Stomach/Bowel: Stomach within normal limits. No evidence for bowel obstruction. Appendix is absent. Mild colonic diverticulosis without evidence for acute diverticulitis. No acute inflammatory changes seen about the  bowels. Vascular/Lymphatic: Intra-abdominal aorta of normal caliber. No adenopathy. Reproductive: Prostate normal. Other: No free air or fluid. Musculoskeletal: No acute osseous abnormality. No worrisome lytic or blastic osseous lesions. IMPRESSION: 1. No CT evidence for nephrolithiasis or obstructive uropathy. No other acute intra-abdominal or pelvic process. 2. Small moderate layering bilateral pleural effusions with evidence of pulmonary interstitial edema. 3. Mild colonic diverticulosis without evidence for acute diverticulitis. 4. Sequelae of prior appendectomy.  Electronically Signed   By: Jeannine Boga M.D.   On: 10/10/2017 07:13   US Biopsy (kidney)  Result Date: 10/14/2017 INDICATION: 32 year old with acute renal failure and on hemodialysis. 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 Renal biopsy has been requested. EXAM: ULTRASOUND-GUIDED RANDOM LEFT RENAL BIOPSY MEDICATIONS: None. ANESTHESIA/SEDATION: Moderate (conscious) sedation was employed during this procedure. A total of Versed 4.5 mg and Fentanyl 200 mcg was administered intravenously. Moderate Sedation Time: 29 minutes. The patient's level of consciousness and vital signs were monitored continuously by radiology nursing throughout the procedure under my direct supervision. FLUOROSCOPY TIME:  None COMPLICATIONS: None immediate. PROCEDURE: Informed written consent was obtained from the patient after a thorough discussion of the procedural risks, benefits and alternatives. All questions were addressed. A timeout was performed prior to the initiation of the procedure. Patient was placed prone. Both kidneys were evaluated with ultrasound. The left kidney was selected for biopsy. The left flank was prepped with chlorhexidine and a sterile field was created. Skin and soft tissues were anesthetized with 1% lidocaine. Coaxial needle was directed towards the left kidney lower pole with ultrasound guidance. 63 gauge core needle was then used to biopsy the left kidney lower pole. The first core biopsy had scant material. Therefore, 2 additional ultrasound-guided core biopsies were obtained with the 16 gauge device. The second and third core biopsies were adequate and placed on a Telfa pad with saline. The coaxial needle was removed. Bandage placed over the puncture site. FINDINGS: Increased echogenicity in the kidneys. No hydronephrosis. Core biopsies obtained from the left kidney lower pole. No significant bleeding or hematoma formation following the core biopsies. IMPRESSION: Ultrasound-guided core biopsies of the left  kidney lower pole. Electronically Signed   By: Markus Daft M.D.   On: 10/14/2017 16:48    Micro Results     No results found for this or any previous visit (from the past 240 hour(s)).     Today   Subjective:   Jeffrey Holloway today feels much better, wants to go home Objective:   Blood pressure (!) 166/96, pulse 87, temperature 97.8 F (36.6 C), temperature source Oral, resp. rate 12, height 6' (1.829 m), weight 79 kg (174 lb 2.6 oz), SpO2 98 %.  No intake or output data in the 24 hours ending 10/25/17 1036  Exam Awake Alert, Oriented x 3, No new F.N deficits, Normal affect .AT,PERRAL Supple Neck,No JVD, No cervical lymphadenopathy appriciated.  Symmetrical Chest wall movement, Good air movement bilaterally, CTAB RRR,No Gallops,Rubs or new Murmurs, No Parasternal Heave +ve B.Sounds, Abd Soft, Non tender, No organomegaly appriciated, No rebound -guarding or rigidity. No Cyanosis, Clubbing or edema, No new Rash or bruise  Data Review   CBC w Diff:  Lab Results  Component Value Date   WBC 9.5 10/18/2017   HGB 8.2 (L) 10/18/2017   HGB 12.1 (L) 01/19/2012   HCT 23.2 (L) 10/18/2017   HCT 35.3 (L) 01/19/2012   PLT 152 10/18/2017   PLT 152 01/19/2012   LYMPHOPCT 4 10/14/2017   LYMPHOPCT 25.7 01/19/2012   MONOPCT 3  10/14/2017   MONOPCT 10.6 01/19/2012   EOSPCT 0 10/14/2017   EOSPCT 1.7 01/19/2012   BASOPCT 0 10/14/2017   BASOPCT 0.4 01/19/2012    CMP:  Lab Results  Component Value Date   NA 133 (L) 10/18/2017   NA 134 (L) 01/17/2012   K 3.3 (L) 10/18/2017   K 3.7 01/17/2012   CL 97 (L) 10/18/2017   CL 100 01/17/2012   CO2 27 10/18/2017   CO2 27 01/17/2012   BUN 53 (H) 10/18/2017   BUN 7 01/17/2012   CREATININE 4.68 (H) 10/18/2017   CREATININE 0.94 01/17/2012   PROT 6.9 10/16/2017   PROT 9.3 (H) 01/17/2012   ALBUMIN 2.3 (L) 10/18/2017   ALBUMIN 4.7 01/17/2012   BILITOT 0.6 10/16/2017   BILITOT 0.8 01/17/2012   ALKPHOS 61 10/16/2017   ALKPHOS 89  01/17/2012   AST 23 10/16/2017   AST 21 01/17/2012   ALT 14 (L) 10/16/2017   ALT 24 01/17/2012  .   Total Time in preparing paper work, data evaluation and todays exam - 35 minutes  Epifanio Lesches M.D on 10/18/2017 at 10:36 AM    Note: This dictation was prepared with Dragon dictation along with smaller phrase technology. Any transcriptional errors that result from this process are unintentional.

## 2017-10-29 NOTE — Progress Notes (Signed)
[place]  [phone] [phone]  ID: Jeffrey Holloway OB: 06-Jul-1985  MR#: [id number]  NID#:[id number]  Patient Care Team: Patient, No Pcp Per as PCP - General (General Practice)  CHIEF COMPLAINT: Crescentic glomerulonephritis.  INTERVAL HISTORY: Patient returns to clinic today for further evaluation and consideration of week 3 of 4 of Rituxan and Cytoxan.  He is tolerating his infusions well without significant side effects. He currently feels well and is at his baseline. He has no neurologic complaints.  He denies any recent fevers or illnesses.  He has a good appetite and denies weight loss.  He has no chest pain or shortness of breath.  He denies any nausea, vomiting, constipation, or diarrhea.  Patient offers no specific complaints today.  REVIEW OF SYSTEMS:   Review of Systems  Constitutional: Negative.  Negative for fever and weight loss.  Respiratory: Negative.  Negative for cough and shortness of breath.   Cardiovascular: Negative.  Negative for chest pain and leg swelling.  Gastrointestinal: Negative.  Negative for abdominal pain.  Genitourinary: Negative.  Negative for flank pain.  Musculoskeletal: Negative.  Negative for back pain.  Skin: Negative.  Negative for rash.  Neurological: Negative.  Negative for sensory change, focal weakness and weakness.  Psychiatric/Behavioral: Negative.  The patient is not nervous/anxious.     As per HPI. Otherwise, a complete review of systems is negative.  PAST MEDICAL HISTORY: Past Medical History:  Diagnosis Date  . Constipation   . Diarrhea   . Glomerulonephritis   . Hemorrhoids   . Kidney infection   . Nausea and vomiting   . Occult blood in stools   . Shortness of breath   . Sleep apnea     PAST SURGICAL HISTORY: Past Surgical History:  Procedure Laterality Date  . APPENDECTOMY    . DIALYSIS/PERMA CATHETER INSERTION N/A 10/15/2017   Procedure: DIALYSIS/PERMA CATHETER INSERTION;   Surgeon: Katha Cabal, MD;  Location: [facility];  Service: Cardiovascular;  Laterality: N/A;    FAMILY HISTORY: History reviewed. No pertinent family history.  ADVANCED DIRECTIVES (Y/N):  N  HEALTH MAINTENANCE: Social History   Tobacco Use  . Smoking status: Current Every Day Smoker    Packs/day: 1.00    Types: Cigarettes  . Smokeless tobacco: Never Used  Substance Use Topics  . Alcohol use: Yes  . Drug use: Yes    Types: Marijuana, Cocaine     Colonoscopy:  PAP:  Bone density:  Lipid panel:  No Known Allergies  Current Outpatient Medications  Medication Sig Dispense Refill  . amLODipine (NORVASC) 10 MG tablet Take 1 tablet (10 mg total) by mouth daily. 30 tablet 0  . calcium acetate (PHOSLO) 667 MG capsule Take 2 capsules (1,334 mg total) by mouth 3 (three) times daily with meals. 60 capsule 0  . calcium carbonate (TUMS - DOSED IN MG ELEMENTAL CALCIUM) 500 MG chewable tablet Chew 1 tablet by mouth 2 (two) times daily.    . cloNIDine (CATAPRES) 0.1 MG tablet Take 1 tablet (0.1 mg total) by mouth 2 (two) times daily. 60 tablet 1  . losartan (COZAAR) 100 MG tablet Take 1 tablet (100 mg total) by mouth daily. 30 tablet 0  . multivitamin (RENA-VIT) TABS tablet Take 1 tablet by mouth at bedtime. 30 tablet 0  . Nutritional Supplements (FEEDING SUPPLEMENT, NEPRO CARB STEADY,) LIQD Take 237 mLs by mouth 2 (two) times daily between meals. 30 Can 0  . omeprazole (PRILOSEC) 20 MG capsule Take  20 mg by mouth daily.    Marland Kitchen oxyCODONE (OXY IR/ROXICODONE) 5 MG immediate release tablet Take 1-2 tablets (5-10 mg total) by mouth every 6 (six) hours as needed for moderate pain. 30 tablet 0  . predniSONE (DELTASONE) 20 MG tablet Take 1.5 tablets (30 mg total) by mouth daily with breakfast. 30 tablet 0   No current facility-administered medications for this visit.     OBJECTIVE: Vitals:   10/31/17 0925  BP: (!) 167/104  Pulse: 77  Resp: 18  Temp: 97.6 F (36.4 C)      Body mass index is 26.11 kg/m.    ECOG FS:0 - Asymptomatic  General: Well-developed, well-nourished, no acute distress. Eyes: Pink conjunctiva, anicteric sclera. Lungs: Clear to auscultation bilaterally. Heart: Regular rate and rhythm. No rubs, murmurs, or gallops. Abdomen: Soft, nontender, nondistended. No organomegaly noted, normoactive bowel sounds. Musculoskeletal: No edema, cyanosis, or clubbing. Neuro: Alert, answering all questions appropriately. Cranial nerves grossly intact. Skin: No rashes or petechiae noted. Psych: Normal affect.  LAB RESULTS:  Lab Results  Component Value Date   NA 133 (L) 10/18/2017   K 3.3 (L) 10/18/2017   CL 97 (L) 10/18/2017   CO2 27 10/18/2017   GLUCOSE 135 (H) 10/18/2017   BUN 53 (H) 10/18/2017   CREATININE 4.68 (H) 10/18/2017   CALCIUM 7.4 (L) 10/18/2017   PROT 6.9 10/16/2017   ALBUMIN 2.3 (L) 10/18/2017   AST 23 10/16/2017   ALT 14 (L) 10/16/2017   ALKPHOS 61 10/16/2017   BILITOT 0.6 10/16/2017   GFRNONAA 15 (L) 10/18/2017   GFRAA 17 (L) 10/18/2017    Lab Results  Component Value Date   WBC 9.5 10/18/2017   NEUTROABS 11.2 (H) 10/14/2017   HGB 8.2 (L) 10/18/2017   HCT 23.2 (L) 10/18/2017   MCV 94.5 10/18/2017   PLT 152 10/18/2017     STUDIES: Dg Chest 2 View  Result Date: 10/10/2017 CLINICAL DATA:  32 year old male with chest pain. EXAM: CHEST - 2 VIEW COMPARISON:  None. FINDINGS: There is diffuse interstitial streaky densities and nodularity as well as Kerley B-lines. Findings consistent with interstitial edema. Superimposed pneumonia is not entirely excluded. Clinical correlation is recommended. There are probably trace bilateral pleural effusions. There is no focal consolidation or pneumothorax. The cardiac silhouette is within normal limits. No acute osseous pathology. IMPRESSION: Interstitial edema. Superimposed pneumonia is not excluded. Clinical correlation is recommended. Electronically Signed   By: Anner Crete M.D.    On: 10/10/2017 05:03   US Renal  Result Date: 10/10/2017 CLINICAL DATA:  Renal failure EXAM: RENAL / URINARY TRACT ULTRASOUND COMPLETE COMPARISON:  CT of the abdomen and pelvis FINDINGS: Right Kidney: Length: 12 cm. Mild increased echogenicity is noted. No hydronephrosis or mass lesion is noted. Left Kidney: Length: 12.3 cm. Mild increased echogenicity is noted without evidence of mass effect. Bladder: Appears normal for degree of bladder distention. IMPRESSION: Mild increased echogenicity bilaterally. No obstructive changes are seen. Electronically Signed   By: Inez Catalina M.D.   On: 10/10/2017 17:31   Dg Chest Port 1 View  Result Date: 10/10/2017 CLINICAL DATA:  Encounter for central line placement. EXAM: PORTABLE CHEST 1 VIEW COMPARISON:  Portable film earlier today. FINDINGS: The heart is enlarged. Perihilar, RIGHT greater than LEFT infiltrates are increased. RIGHT pleural effusion. Dual lumen catheter has been placed from a LEFT IJ approach. Tip lies in the mid to distal SVC. There is no pneumothorax. IMPRESSION: Worsening aeration, particularly on the RIGHT. Dual lumen catheter tip mid  to distal SVC. LEFT IJ approach. No pneumothorax. Electronically Signed   By: Staci Righter M.D.   On: 10/10/2017 15:17   Ct Renal Stone Study  Result Date: 10/10/2017 CLINICAL DATA:  Initial evaluation for acute bilateral flank pain. EXAM: CT ABDOMEN AND PELVIS WITHOUT CONTRAST TECHNIQUE: Multidetector CT imaging of the abdomen and pelvis was performed following the standard protocol without IV contrast. COMPARISON:  Prior CT from 01/17/2012. FINDINGS: Lower chest: Layering bilateral pleural effusions. Diffuse interlobular septal thickening at the lung bases consistent with interstitial edema. Superimposed atelectatic changes. Hepatobiliary: Limited noncontrast evaluation of the liver is unremarkable. Gallbladder within normal limits. No biliary dilatation. Pancreas: Pancreas within normal limits. Spleen: Spleen  within normal limits. Adrenals/Urinary Tract: Adrenal glands are normal. Kidneys equal in size without evidence for nephrolithiasis or hydronephrosis. No radiopaque calculi seen along the course of either renal collecting system. No hydroureter. Partially distended bladder within normal limits. No layering stones within the bladder lumen. Stomach/Bowel: Stomach within normal limits. No evidence for bowel obstruction. Appendix is absent. Mild colonic diverticulosis without evidence for acute diverticulitis. No acute inflammatory changes seen about the bowels. Vascular/Lymphatic: Intra-abdominal aorta of normal caliber. No adenopathy. Reproductive: Prostate normal. Other: No free air or fluid. Musculoskeletal: No acute osseous abnormality. No worrisome lytic or blastic osseous lesions. IMPRESSION: 1. No CT evidence for nephrolithiasis or obstructive uropathy. No other acute intra-abdominal or pelvic process. 2. Small moderate layering bilateral pleural effusions with evidence of pulmonary interstitial edema. 3. Mild colonic diverticulosis without evidence for acute diverticulitis. 4. Sequelae of prior appendectomy. Electronically Signed   By: Jeannine Boga M.D.   On: 10/10/2017 07:13   US Biopsy (kidney)  Result Date: 10/14/2017 INDICATION: 32 year old with acute renal failure and on hemodialysis. 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 Renal biopsy has been requested. EXAM: ULTRASOUND-GUIDED RANDOM LEFT RENAL BIOPSY MEDICATIONS: None. ANESTHESIA/SEDATION: Moderate (conscious) sedation was employed during this procedure. A total of Versed 4.5 mg and Fentanyl 200 mcg was administered intravenously. Moderate Sedation Time: 29 minutes. The patient's level of consciousness and vital signs were monitored continuously by radiology nursing throughout the procedure under my direct supervision. FLUOROSCOPY TIME:  None COMPLICATIONS: None immediate. PROCEDURE: Informed written consent was obtained from the patient after a  thorough discussion of the procedural risks, benefits and alternatives. All questions were addressed. A timeout was performed prior to the initiation of the procedure. Patient was placed prone. Both kidneys were evaluated with ultrasound. The left kidney was selected for biopsy. The left flank was prepped with chlorhexidine and a sterile field was created. Skin and soft tissues were anesthetized with 1% lidocaine. Coaxial needle was directed towards the left kidney lower pole with ultrasound guidance. 38 gauge core needle was then used to biopsy the left kidney lower pole. The first core biopsy had scant material. Therefore, 2 additional ultrasound-guided core biopsies were obtained with the 16 gauge device. The second and third core biopsies were adequate and placed on a Telfa pad with saline. The coaxial needle was removed. Bandage placed over the puncture site. FINDINGS: Increased echogenicity in the kidneys. No hydronephrosis. Core biopsies obtained from the left kidney lower pole. No significant bleeding or hematoma formation following the core biopsies. IMPRESSION: Ultrasound-guided core biopsies of the left kidney lower pole. Electronically Signed   By: Markus Daft M.D.   On: 10/14/2017 16:48    ASSESSMENT: Crescentic glomerulonephritis.  PLAN:    1.  Crescentic glomerular nephritis: Proceed with cycle 3 of 4 of weekly Rituxan and Cytoxan today.  Plan is to receive weekly IV rituximab 800 mg (rounded from 750 mg or 375 mg/m) and q2 week IV cyclophosphamide 600 mg (or 300 mg/m).    Patient expressed understanding that all laboratory work will be monitored by nephrology.  He also expressed understanding that any questions or concerns he has with his diagnosis or treatment should be directed towards nephrology as well.  Return to clinic in 1 week for further evaluation and consideration of cycle 4 of Rituxan.  No further follow-up has been scheduled.  Patient has been instructed to follow-up with  nephrology as indicated.  Please refer patient back if there are any questions or concerns.   Approximately 30 minutes was spent in discussion of which greater than 50% was consultation.  Patient expressed understanding and was in agreement with this plan. He also understands that He can call clinic at any time with any questions, concerns, or complaints.   Cancer Staging No matching staging information was found for the patient.  Lloyd Huger, MD   11/05/2017 11:38 PM

## 2017-10-31 ENCOUNTER — Inpatient Hospital Stay (HOSPITAL_BASED_OUTPATIENT_CLINIC_OR_DEPARTMENT_OTHER): Payer: BLUE CROSS/BLUE SHIELD | Admitting: Oncology

## 2017-10-31 ENCOUNTER — Encounter: Payer: Self-pay | Admitting: Oncology

## 2017-10-31 ENCOUNTER — Inpatient Hospital Stay: Payer: BLUE CROSS/BLUE SHIELD

## 2017-10-31 VITALS — BP 158/96 | HR 73

## 2017-10-31 VITALS — BP 167/104 | HR 77 | Temp 97.6°F | Resp 18 | Wt 192.5 lb

## 2017-10-31 DIAGNOSIS — F17210 Nicotine dependence, cigarettes, uncomplicated: Secondary | ICD-10-CM | POA: Diagnosis not present

## 2017-10-31 DIAGNOSIS — Z7952 Long term (current) use of systemic steroids: Secondary | ICD-10-CM

## 2017-10-31 DIAGNOSIS — Z992 Dependence on renal dialysis: Secondary | ICD-10-CM

## 2017-10-31 DIAGNOSIS — F1210 Cannabis abuse, uncomplicated: Secondary | ICD-10-CM | POA: Diagnosis not present

## 2017-10-31 DIAGNOSIS — N057 Unspecified nephritic syndrome with diffuse crescentic glomerulonephritis: Secondary | ICD-10-CM | POA: Diagnosis not present

## 2017-10-31 DIAGNOSIS — N179 Acute kidney failure, unspecified: Secondary | ICD-10-CM

## 2017-10-31 DIAGNOSIS — Z79899 Other long term (current) drug therapy: Secondary | ICD-10-CM | POA: Diagnosis not present

## 2017-10-31 DIAGNOSIS — G4730 Sleep apnea, unspecified: Secondary | ICD-10-CM | POA: Diagnosis not present

## 2017-10-31 DIAGNOSIS — F1410 Cocaine abuse, uncomplicated: Secondary | ICD-10-CM

## 2017-10-31 MED ORDER — SODIUM CHLORIDE 0.9 % IV SOLN: 375.0000 mg/m2 | Freq: Once | INTRAVENOUS | Status: DC

## 2017-10-31 MED ORDER — SODIUM CHLORIDE 0.9 % IV SOLN
Freq: Once | INTRAVENOUS | Status: AC
  Administered 2017-10-31: 10:00:00 via INTRAVENOUS
  Filled 2017-10-31: qty 1000

## 2017-10-31 MED ORDER — SODIUM CHLORIDE 0.9 % IV SOLN
300.0000 mg/m2 | Freq: Once | INTRAVENOUS | Status: AC
  Administered 2017-10-31: 600 mg via INTRAVENOUS
  Filled 2017-10-31: qty 30

## 2017-10-31 MED ORDER — SODIUM CHLORIDE 0.9 % IV SOLN
375.0000 mg/m2 | Freq: Once | INTRAVENOUS | Status: AC
  Administered 2017-10-31: 800 mg via INTRAVENOUS
  Filled 2017-10-31: qty 50

## 2017-10-31 MED ORDER — SODIUM CHLORIDE 0.9 % IV SOLN
Freq: Once | INTRAVENOUS | Status: AC
  Administered 2017-10-31: 10:00:00 via INTRAVENOUS
  Filled 2017-10-31: qty 5

## 2017-10-31 MED ORDER — DIPHENHYDRAMINE HCL 25 MG PO CAPS
25.0000 mg | ORAL_CAPSULE | Freq: Once | ORAL | Status: AC
  Administered 2017-10-31: 25 mg via ORAL
  Filled 2017-10-31: qty 1

## 2017-10-31 MED ORDER — ACETAMINOPHEN 325 MG PO TABS
650.0000 mg | ORAL_TABLET | Freq: Once | ORAL | Status: AC
  Administered 2017-10-31: 650 mg via ORAL
  Filled 2017-10-31: qty 2

## 2017-10-31 NOTE — Progress Notes (Signed)
Per Tillie Rung RN per Dr. Grayland Ormond okay to proceed with B/p  158/96 and 10/18/17 labs

## 2017-10-31 NOTE — Progress Notes (Signed)
Patient denies any concerns today.  

## 2017-11-07 ENCOUNTER — Inpatient Hospital Stay: Payer: BLUE CROSS/BLUE SHIELD | Attending: Oncology

## 2017-11-07 VITALS — BP 160/94 | HR 66 | Temp 97.3°F | Resp 18

## 2017-11-07 DIAGNOSIS — N179 Acute kidney failure, unspecified: Secondary | ICD-10-CM | POA: Insufficient documentation

## 2017-11-07 DIAGNOSIS — N057 Unspecified nephritic syndrome with diffuse crescentic glomerulonephritis: Secondary | ICD-10-CM

## 2017-11-07 MED ORDER — ACETAMINOPHEN 325 MG PO TABS
650.0000 mg | ORAL_TABLET | Freq: Once | ORAL | Status: AC
  Administered 2017-11-07: 650 mg via ORAL
  Filled 2017-11-07: qty 2

## 2017-11-07 MED ORDER — DIPHENHYDRAMINE HCL 25 MG PO CAPS
25.0000 mg | ORAL_CAPSULE | Freq: Once | ORAL | Status: AC
  Administered 2017-11-07: 25 mg via ORAL
  Filled 2017-11-07: qty 1

## 2017-11-07 MED ORDER — SODIUM CHLORIDE 0.9 % IV SOLN
Freq: Once | INTRAVENOUS | Status: AC
  Administered 2017-11-07: 09:00:00 via INTRAVENOUS
  Filled 2017-11-07: qty 1000

## 2017-11-07 MED ORDER — SODIUM CHLORIDE 0.9 % IV SOLN
375.0000 mg/m2 | Freq: Once | INTRAVENOUS | Status: AC
  Administered 2017-11-07: 800 mg via INTRAVENOUS
  Filled 2017-11-07: qty 30

## 2017-11-07 MED ORDER — SODIUM CHLORIDE 0.9 % IV SOLN: 375.0000 mg/m2 | Freq: Once | INTRAVENOUS | Status: DC

## 2017-11-25 ENCOUNTER — Other Ambulatory Visit: Payer: Self-pay

## 2017-11-25 ENCOUNTER — Inpatient Hospital Stay
Admission: EM | Admit: 2017-11-25 | Discharge: 2017-11-27 | DRG: 602 | Disposition: A | Discharge status: Home / Self Care | Payer: BLUE CROSS/BLUE SHIELD | Source: Ambulatory Visit | Attending: Family Medicine | Admitting: Family Medicine

## 2017-11-25 ENCOUNTER — Encounter: Payer: Self-pay | Admitting: Emergency Medicine

## 2017-11-25 DIAGNOSIS — K219 Gastro-esophageal reflux disease without esophagitis: Secondary | ICD-10-CM | POA: Diagnosis present

## 2017-11-25 DIAGNOSIS — D631 Anemia in chronic kidney disease: Secondary | ICD-10-CM | POA: Diagnosis present

## 2017-11-25 DIAGNOSIS — L0390 Cellulitis, unspecified: Secondary | ICD-10-CM | POA: Diagnosis present

## 2017-11-25 DIAGNOSIS — Z992 Dependence on renal dialysis: Secondary | ICD-10-CM

## 2017-11-25 DIAGNOSIS — N186 End stage renal disease: Secondary | ICD-10-CM | POA: Diagnosis present

## 2017-11-25 DIAGNOSIS — I160 Hypertensive urgency: Secondary | ICD-10-CM | POA: Diagnosis present

## 2017-11-25 DIAGNOSIS — Z87448 Personal history of other diseases of urinary system: Secondary | ICD-10-CM

## 2017-11-25 DIAGNOSIS — I120 Hypertensive chronic kidney disease with stage 5 chronic kidney disease or end stage renal disease: Secondary | ICD-10-CM | POA: Diagnosis present

## 2017-11-25 DIAGNOSIS — F17210 Nicotine dependence, cigarettes, uncomplicated: Secondary | ICD-10-CM | POA: Diagnosis present

## 2017-11-25 DIAGNOSIS — L03011 Cellulitis of right finger: Principal | ICD-10-CM | POA: Diagnosis present

## 2017-11-25 DIAGNOSIS — I776 Arteritis, unspecified: Secondary | ICD-10-CM | POA: Diagnosis present

## 2017-11-25 DIAGNOSIS — Z79899 Other long term (current) drug therapy: Secondary | ICD-10-CM

## 2017-11-25 DIAGNOSIS — Z9049 Acquired absence of other specified parts of digestive tract: Secondary | ICD-10-CM

## 2017-11-25 DIAGNOSIS — N2581 Secondary hyperparathyroidism of renal origin: Secondary | ICD-10-CM | POA: Diagnosis present

## 2017-11-25 LAB — CBC WITH DIFFERENTIAL/PLATELET
BASOS ABS: 0.1 10*3/uL (ref 0–0.1)
BASOS PCT: 1 %
EOS ABS: 0 10*3/uL (ref 0–0.7)
EOS PCT: 0 %
HCT: 20.9 % — ABNORMAL LOW (ref 40.0–52.0)
Hemoglobin: 7.5 g/dL — ABNORMAL LOW (ref 13.0–18.0)
Lymphocytes Relative: 5 %
Lymphs Abs: 0.5 10*3/uL — ABNORMAL LOW (ref 1.0–3.6)
MCH: 33.7 pg (ref 26.0–34.0)
MCHC: 36.1 g/dL — ABNORMAL HIGH (ref 32.0–36.0)
MCV: 93.5 fL (ref 80.0–100.0)
Monocytes Absolute: 0.4 10*3/uL (ref 0.2–1.0)
Monocytes Relative: 4 %
NEUTROS PCT: 90 %
Neutro Abs: 8.5 10*3/uL — ABNORMAL HIGH (ref 1.4–6.5)
PLATELETS: 226 10*3/uL (ref 150–440)
RBC: 2.23 MIL/uL — AB (ref 4.40–5.90)
RDW: 15.4 % — ABNORMAL HIGH (ref 11.5–14.5)
WBC: 9.4 10*3/uL (ref 3.8–10.6)

## 2017-11-25 LAB — COMPREHENSIVE METABOLIC PANEL
ALT: 18 U/L (ref 17–63)
AST: 11 U/L — ABNORMAL LOW (ref 15–41)
Albumin: 3.4 g/dL — ABNORMAL LOW (ref 3.5–5.0)
Alkaline Phosphatase: 46 U/L (ref 38–126)
Anion gap: 12 (ref 5–15)
BUN: 90 mg/dL — ABNORMAL HIGH (ref 6–20)
CO2: 18 mmol/L — ABNORMAL LOW (ref 22–32)
Calcium: 9.7 mg/dL (ref 8.9–10.3)
Chloride: 110 mmol/L (ref 101–111)
Creatinine, Ser: 9.85 mg/dL — ABNORMAL HIGH (ref 0.61–1.24)
GFR calc Af Amer: 7 mL/min — ABNORMAL LOW (ref >60)
GFR calc non Af Amer: 6 mL/min — ABNORMAL LOW (ref >60)
Glucose, Bld: 106 mg/dL — ABNORMAL HIGH (ref 65–99)
Potassium: 4.3 mmol/L (ref 3.5–5.1)
Sodium: 140 mmol/L (ref 135–145)
Total Bilirubin: 0.4 mg/dL (ref 0.3–1.2)
Total Protein: 6.1 g/dL — ABNORMAL LOW (ref 6.5–8.1)

## 2017-11-25 LAB — LACTIC ACID, PLASMA: Lactic Acid, Venous: 0.5 mmol/L (ref 0.5–1.9)

## 2017-11-25 LAB — VANCOMYCIN, RANDOM: VANCOMYCIN RM: 12

## 2017-11-25 MED ORDER — ACETAMINOPHEN 325 MG PO TABS
650.0000 mg | ORAL_TABLET | Freq: Four times a day (QID) | ORAL | Status: DC | PRN
  Administered 2017-11-26 (×2): 650 mg via ORAL
  Filled 2017-11-25 (×2): qty 2

## 2017-11-25 MED ORDER — RENA-VITE PO TABS
1.0000 | ORAL_TABLET | Freq: Every day | ORAL | Status: DC
  Administered 2017-11-26: 1 via ORAL
  Filled 2017-11-25: qty 1

## 2017-11-25 MED ORDER — PANTOPRAZOLE SODIUM 40 MG PO TBEC
40.0000 mg | DELAYED_RELEASE_TABLET | Freq: Every day | ORAL | Status: DC
  Administered 2017-11-26 – 2017-11-27 (×2): 40 mg via ORAL
  Filled 2017-11-25 (×2): qty 1

## 2017-11-25 MED ORDER — ACETAMINOPHEN 650 MG RE SUPP: 650.0000 mg | Freq: Four times a day (QID) | RECTAL | Status: DC | PRN

## 2017-11-25 MED ORDER — TRAMADOL HCL 50 MG PO TABS
50.0000 mg | ORAL_TABLET | Freq: Four times a day (QID) | ORAL | Status: DC | PRN
  Administered 2017-11-25 – 2017-11-27 (×7): 50 mg via ORAL
  Filled 2017-11-25 (×7): qty 1

## 2017-11-25 MED ORDER — MORPHINE SULFATE (PF) 4 MG/ML IV SOLN
INTRAVENOUS | Status: AC
  Administered 2017-11-25: 4 mg via INTRAVENOUS
  Filled 2017-11-25: qty 1

## 2017-11-25 MED ORDER — ONDANSETRON HCL 4 MG PO TABS: 4.0000 mg | ORAL_TABLET | Freq: Four times a day (QID) | ORAL | Status: DC | PRN

## 2017-11-25 MED ORDER — MORPHINE SULFATE (PF) 4 MG/ML IV SOLN
4.0000 mg | Freq: Once | INTRAVENOUS | Status: AC
  Administered 2017-11-25 (×2): 4 mg via INTRAVENOUS
  Filled 2017-11-25: qty 1

## 2017-11-25 MED ORDER — HEPARIN SODIUM (PORCINE) 5000 UNIT/ML IJ SOLN
5000.0000 [IU] | Freq: Three times a day (TID) | INTRAMUSCULAR | Status: DC
  Administered 2017-11-25 – 2017-11-27 (×3): 5000 [IU] via SUBCUTANEOUS
  Filled 2017-11-25 (×4): qty 1

## 2017-11-25 MED ORDER — LABETALOL HCL 5 MG/ML IV SOLN
10.0000 mg | INTRAVENOUS | Status: DC | PRN
  Administered 2017-11-25: 10 mg via INTRAVENOUS
  Filled 2017-11-25: qty 4

## 2017-11-25 MED ORDER — NEPRO/CARBSTEADY PO LIQD
237.0000 mL | Freq: Two times a day (BID) | ORAL | Status: DC
  Administered 2017-11-26 – 2017-11-27 (×3): 237 mL via ORAL

## 2017-11-25 MED ORDER — PIPERACILLIN-TAZOBACTAM 3.375 G IVPB 30 MIN
3.3750 g | Freq: Once | INTRAVENOUS | Status: AC
  Administered 2017-11-25: 3.375 g via INTRAVENOUS
  Filled 2017-11-25: qty 50

## 2017-11-25 MED ORDER — CLONIDINE HCL 0.1 MG PO TABS
0.2000 mg | ORAL_TABLET | Freq: Once | ORAL | Status: AC
  Administered 2017-11-25: 0.2 mg via ORAL

## 2017-11-25 MED ORDER — PIPERACILLIN-TAZOBACTAM 3.375 G IVPB
3.3750 g | Freq: Two times a day (BID) | INTRAVENOUS | Status: DC
  Administered 2017-11-26 – 2017-11-27 (×3): 3.375 g via INTRAVENOUS
  Filled 2017-11-25 (×3): qty 50

## 2017-11-25 MED ORDER — ONDANSETRON HCL 4 MG/2ML IJ SOLN: 4.0000 mg | Freq: Four times a day (QID) | INTRAMUSCULAR | Status: DC | PRN

## 2017-11-25 MED ORDER — PNEUMOCOCCAL VAC POLYVALENT 25 MCG/0.5ML IJ INJ: 0.5000 mL | INJECTION | INTRAMUSCULAR | Status: DC

## 2017-11-25 MED ORDER — CLONIDINE HCL 0.1 MG PO TABS
ORAL_TABLET | ORAL | Status: AC
  Filled 2017-11-25: qty 2

## 2017-11-25 MED ORDER — CLONIDINE HCL 0.1 MG PO TABS
0.1000 mg | ORAL_TABLET | Freq: Two times a day (BID) | ORAL | Status: DC
  Administered 2017-11-25 – 2017-11-27 (×4): 0.1 mg via ORAL
  Filled 2017-11-25 (×4): qty 1

## 2017-11-25 MED ORDER — VANCOMYCIN HCL IN DEXTROSE 750-5 MG/150ML-% IV SOLN
750.0000 mg | Freq: Once | INTRAVENOUS | Status: AC
  Administered 2017-11-25: 750 mg via INTRAVENOUS
  Filled 2017-11-25: qty 150

## 2017-11-25 MED ORDER — AMLODIPINE BESYLATE 10 MG PO TABS
10.0000 mg | ORAL_TABLET | Freq: Every day | ORAL | Status: DC
  Administered 2017-11-26 – 2017-11-27 (×2): 10 mg via ORAL
  Filled 2017-11-25 (×2): qty 1

## 2017-11-25 MED ORDER — ENOXAPARIN SODIUM 40 MG/0.4ML ~~LOC~~ SOLN: 40.0000 mg | SUBCUTANEOUS | Status: DC

## 2017-11-25 MED ORDER — MORPHINE SULFATE (PF) 4 MG/ML IV SOLN
4.0000 mg | Freq: Once | INTRAVENOUS | Status: AC
  Administered 2017-11-25: 4 mg via INTRAVENOUS

## 2017-11-25 MED ORDER — PREDNISONE 10 MG PO TABS
30.0000 mg | ORAL_TABLET | Freq: Every day | ORAL | Status: DC
  Administered 2017-11-26 – 2017-11-27 (×2): 30 mg via ORAL
  Filled 2017-11-25 (×2): qty 3

## 2017-11-25 MED ORDER — LOSARTAN POTASSIUM 50 MG PO TABS
100.0000 mg | ORAL_TABLET | Freq: Every day | ORAL | Status: DC
Start: 2017-11-26 — End: 2017-11-27
  Administered 2017-11-26 – 2017-11-27 (×2): 100 mg via ORAL
  Filled 2017-11-25 (×2): qty 2

## 2017-11-25 MED ORDER — CALCIUM CARBONATE ANTACID 500 MG PO CHEW
1.0000 | CHEWABLE_TABLET | Freq: Two times a day (BID) | ORAL | Status: DC
  Administered 2017-11-25 – 2017-11-26 (×3): 200 mg via ORAL
  Filled 2017-11-25 (×3): qty 1

## 2017-11-25 MED ORDER — CALCIUM ACETATE (PHOS BINDER) 667 MG PO CAPS
1334.0000 mg | ORAL_CAPSULE | Freq: Three times a day (TID) | ORAL | Status: DC
  Administered 2017-11-26 – 2017-11-27 (×4): 1334 mg via ORAL
  Filled 2017-11-25 (×4): qty 2

## 2017-11-25 NOTE — Progress Notes (Signed)
Anticoagulation monitoring(Lovenox):  32 yo male ordered Lovenox 40 mg Q24h  Filed Weights   11/25/17 1551  Weight: 170 lb (77.1 kg)   BMI   Lab Results  Component Value Date   CREATININE 9.85 (H) 11/25/2017   CREATININE 4.68 (H) 10/18/2017   CREATININE 6.67 (H) 10/15/2017   Estimated Creatinine Clearance: 11.6 mL/min (A) (by C-G formula based on SCr of 9.85 mg/dL (H)). Hemoglobin & Hematocrit     Component Value Date/Time   HGB 7.5 (L) 11/25/2017 1555   HGB 12.1 (L) 01/19/2012 0550   HCT 20.9 (L) 11/25/2017 1555   HCT 35.3 (L) 01/19/2012 0550     Per Protocol for Patient with estCrcl < 15 ml/min and BMI < 40, will transition to Heparin 5000 units Q8h.

## 2017-11-25 NOTE — ED Triage Notes (Signed)
States got dry skin hands 6 days ago and got cracked skin R index finger. Has been swollen and painful since. 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 Had IV antibiotics at dialysis treatment last wed and fri. Did not have dialysis today.

## 2017-11-25 NOTE — Consult Note (Signed)
ORTHOPAEDIC CONSULTATION  REQUESTING PHYSICIAN: Nicholes Mango, MD  Chief Complaint: Right index finger erythema and swelling.  HPI: Jeffrey Holloway is a 32 y.o. male who complains of erythema and swelling in his right index finger which began last Tuesday.  Patient denies injury.  He states that his hands were very dry and his skin began cracking.  He believes the entry site was on the dorsal surface of his finger over the DIP joint.  He had progression of swelling and redness throughout the right index finger into his hand and began to involve the middle finger of the proximal phalanx.  Patient has a history of glomerulonephritis and end-stage renal disease for which he receives hemodialysis on Monday Wednesday and Friday.  He is on chemotherapy agents for the vasculitits. He began receiving vancomycin and a cephalosporin during dialysis which caused the erythema and swelling to recede back to the index finger alone.  The patient has had persistent pain in the index finger which is why he has come to the ER after a week.  He states that initially he saw purulent drainage from the dorsum of the finger near the DIP joint which has now scabbed over.  Patient did not have his regularly scheduled hemodialysis today.  He is seen in the emergency department with a male companion at the bedside.  Past Medical History:  Diagnosis Date  . Constipation   . Diarrhea   . Glomerulonephritis   . Hemorrhoids   . Kidney infection   . Nausea and vomiting   . Occult blood in stools   . Shortness of breath   . Sleep apnea    Past Surgical History:  Procedure Laterality Date  . APPENDECTOMY    . DIALYSIS/PERMA CATHETER INSERTION N/A 10/15/2017   Procedure: DIALYSIS/PERMA CATHETER INSERTION;  Surgeon: Katha Cabal, MD;  Location: [facility] [facility];  Service: Cardiovascular;  Laterality: N/A;   Social History   Socioeconomic History  . Marital status: Married    Spouse name: Not on file  .  Number of children: Not on file  . Years of education: Not on file  . Highest education level: Not on file  Occupational History  . Not on file  Social Needs  . Financial resource strain: Not on file  . Food insecurity:    Worry: Not on file    Inability: Not on file  . Transportation needs:    Medical: Not on file    Non-medical: Not on file  Tobacco Use  . Smoking status: Current Every Day Smoker    Packs/day: 0.25    Types: Cigarettes  . Smokeless tobacco: Never Used  Substance and Sexual Activity  . Alcohol use: Yes  . Drug use: Yes    Types: Marijuana, Cocaine  . Sexual activity: Not on file  Lifestyle  . Physical activity:    Days per week: Not on file    Minutes per session: Not on file  . Stress: Not on file  Relationships  . Social connections:    Talks on phone: Not on file    Gets together: Not on file    Attends religious service: Not on file    Active member of club or organization: Not on file    Attends meetings of clubs or organizations: Not on file    Relationship status: Not on file  Other Topics Concern  . Not on file  Social History Narrative  . Not on file   No family history  on file. No Known Allergies Prior to Admission medications   Medication Sig Start Date End Date Taking? Authorizing Provider  amLODipine (NORVASC) 10 MG tablet Take 1 tablet (10 mg total) by mouth daily. 10/18/17  Yes Epifanio Lesches, MD  calcium acetate (PHOSLO) 667 MG capsule Take 2 capsules (1,334 mg total) by mouth 3 (three) times daily with meals. 10/18/17  Yes Epifanio Lesches, MD  calcium carbonate (TUMS - DOSED IN MG ELEMENTAL CALCIUM) 500 MG chewable tablet Chew 1 tablet by mouth 2 (two) times daily.   Yes [provider]  cloNIDine (CATAPRES) 0.1 MG tablet Take 1 tablet (0.1 mg total) by mouth 2 (two) times daily. 10/18/17  Yes Epifanio Lesches, MD  losartan (COZAAR) 100 MG tablet Take 1 tablet (100 mg total) by mouth daily. 10/18/17  Yes Epifanio Lesches, MD  multivitamin (RENA-VIT) TABS tablet Take 1 tablet by mouth at bedtime. 10/18/17  Yes Epifanio Lesches, MD  Nutritional Supplements (FEEDING SUPPLEMENT, NEPRO CARB STEADY,) LIQD Take 237 mLs by mouth 2 (two) times daily between meals. 10/18/17  Yes Epifanio Lesches, MD  omeprazole (PRILOSEC) 20 MG capsule Take 20 mg by mouth daily.   Yes [provider]  predniSONE (DELTASONE) 20 MG tablet Take 1.5 tablets (30 mg total) by mouth daily with breakfast. Patient taking differently: Take 20 mg by mouth daily with breakfast.  10/19/17  Yes Epifanio Lesches, MD  oxyCODONE (OXY IR/ROXICODONE) 5 MG immediate release tablet Take 1-2 tablets (5-10 mg total) by mouth every 6 (six) hours as needed for moderate pain. Patient not taking: Reported on 11/25/2017 10/18/17   Epifanio Lesches, MD   No results found.  Positive ROS: All other systems have been reviewed and were otherwise negative with the exception of those mentioned in the HPI and as above.  Physical Exam: General: Alert, no acute distress  MUSCULOSKELETAL: Right index finger: Patient primarily has dorsal swelling from the mid proximal phalanx to the nail plate.  There is associated erythema primarily dorsally.  No tenderness of the flexor tendons.  There is a area of focal swelling and mild fluctuance over the PIP joint.  Patient has active flexion and extension of the DIP PIP and MP joints which is painless but limited due to the swelling.  He has tenderness over the area of swelling dorsally.  No active drainage is seen or can be expressed.  There is no involvement of any digit besides the index finger or the patient's hand.  He has intact sensation to light touch throughout the right index finger and the fingers well-perfused.  He does not have classic Kanavel's signs of flexor tenosynovitis.  Assessment: Index finger cellulitis versus dorsal abscess  Plan: Patient gives a history of improvement on IV  antibiotics during his hemodialysis.  Patient does not have pain with joint motion.  The dorsal swelling is mild and erythema faint.  I do not believe the patient requires urgent surgical I&D.  I believe the patient has been partially treated with IV antibiotics and just requires more consistent IV antibiotics.  I am recommending admission to the hospitalist service for IV antibiotics.  I recommend continuing to elevate the right hand.  I will continue to follow clinically to determine if surgical intervention is needed.    Thornton Park, MD    11/25/2017 8:05 PM

## 2017-11-25 NOTE — ED Provider Notes (Addendum)
[facility] Emergency Department Provider Note  ____________________________________________   I have reviewed the triage vital signs and the nursing notes. Where available I have reviewed prior notes and, if possible and indicated, outside hospital notes.    HISTORY  Chief Complaint Wound Infection    HPI Jeffrey Holloway is a 32 y.o. 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  Denies any chest pain or shortness of breath, is here because his finger, index finger, on the left hand is swollen and painful.  He states that it began with cracked skin about a week ago and since that time is been getting worse.  At one point there were red streak starting to go up his hand, he is been receiving vancomycin and a cephalosporin during dialysis, and the redness has retreated to just mostly the finger itself.  Is the dorsum of the right index finger which is causing him the most discomfort.  The patient states that he has had a fever but not since a week ago.  He states that the reason he is here is because the pain is making it very difficult for him to sleep, he states that one point he was able to bend the finger and elicit pus from the posterior aspect of the finger but that no longer is the case. He did not have dialysis today because [facility] and they sent him here.  Patient does note a low hemoglobin here, however he states he had no rectal bleeding, he is on Epogen.   Past Medical History:  Diagnosis Date  . Constipation   . Diarrhea   . Glomerulonephritis   . Hemorrhoids   . Kidney infection   . Nausea and vomiting   . Occult blood in stools   . Shortness of breath   . Sleep apnea     Patient Active Problem List   Diagnosis  Date Noted  . 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 Acute renal failure (ARF) ([place]) 10/10/2017  . GERD (gastroesophageal reflux disease) 10/10/2017  . Rectal foreign body     Past Surgical History:  Procedure Laterality Date  . APPENDECTOMY    . DIALYSIS/PERMA CATHETER INSERTION N/A 10/15/2017   Procedure: DIALYSIS/PERMA CATHETER INSERTION;  Surgeon: Katha Cabal, MD;  Location: [place] [facility];  Service: Cardiovascular;  Laterality: N/A;    Prior to Admission medications   Medication Sig Start Date End Date Taking? Authorizing Provider  amLODipine (NORVASC) 10 MG tablet Take 1 tablet (10 mg total) by mouth daily. 10/18/17   Epifanio Lesches, MD  calcium acetate (PHOSLO) 667 MG capsule Take 2 capsules (1,334 mg total) by mouth 3 (three) times daily with meals. 10/18/17   Epifanio Lesches, MD  calcium carbonate (TUMS - DOSED IN MG ELEMENTAL CALCIUM) 500 MG chewable tablet Chew 1 tablet by mouth 2 (two) times daily.    [provider]  cloNIDine (CATAPRES) 0.1 MG tablet Take 1 tablet (0.1 mg total) by mouth 2 (two) times daily. 10/18/17   Epifanio Lesches, MD  losartan (COZAAR) 100 MG tablet Take 1 tablet (100 mg total) by mouth daily. 10/18/17   Epifanio Lesches, MD  multivitamin (RENA-VIT) TABS tablet Take 1 tablet by mouth at bedtime. 10/18/17   Epifanio Lesches, MD  Nutritional Supplements (FEEDING SUPPLEMENT,  NEPRO CARB STEADY,) LIQD Take 237 mLs by mouth 2 (two) times daily between meals. 10/18/17   Epifanio Lesches, MD  omeprazole (PRILOSEC) 20 MG capsule Take 20 mg by mouth daily.    [provider]  oxyCODONE (OXY IR/ROXICODONE) 5 MG immediate release tablet Take 1-2 tablets (5-10 mg total) by mouth every 6 (six) hours as needed for moderate pain. 10/18/17   Epifanio Lesches, MD  predniSONE (DELTASONE) 20 MG tablet Take 1.5 tablets (30 mg total) by mouth daily with breakfast. 10/19/17   Epifanio Lesches, MD     Allergies Patient has no known allergies.  No family history on file.  Social History Social History   Tobacco Use  . Smoking status: Current Every Day Smoker    Packs/day: 0.25    Types: Cigarettes  . Smokeless tobacco: Never Used  Substance Use Topics  . Alcohol use: Yes  . Drug use: Yes    Types: Marijuana, Cocaine    Review of Systems Constitutional: No fever/chills Eyes: No visual changes. ENT: No sore throat. No stiff neck no neck pain Cardiovascular: Denies chest pain. Respiratory: Denies shortness of breath. Gastrointestinal:   no vomiting.  No diarrhea.  No constipation. Genitourinary: Negative for dysuria. Musculoskeletal: Negative lower extremity swelling Skin: Negative for rash. Neurological: Negative for severe headaches, focal weakness or numbness.   ____________________________________________   PHYSICAL EXAM:  VITAL SIGNS: ED Triage Vitals  Enc Vitals Group     BP 11/25/17 1550 (!) 162/104     Pulse Rate 11/25/17 1550 78     Resp 11/25/17 1550 20     Temp 11/25/17 1550 99.5 F (37.5 C)     Temp Source 11/25/17 1550 Oral     SpO2 11/25/17 1550 96 %     Weight 11/25/17 1551 170 lb (77.1 kg)     Height 11/25/17 1551 6' (1.829 m)     Head Circumference --      Peak Flow --      Pain Score 11/25/17 1551 10     Pain Loc --      Pain Edu? --      Excl. in [place]? --     Constitutional: Alert and oriented. Well appearing and in no acute distress. Eyes: Conjunctivae are normal Head: Atraumatic HEENT: No congestion/rhinnorhea. Mucous membranes are moist.  Oropharynx non-erythematous Neck:   Nontender with no meningismus, no masses, no stridor Cardiovascular: Normal rate, regular rhythm. Grossly normal heart sounds.  Good peripheral circulation. Respiratory: Normal respiratory effort.  No retractions. Lungs CTAB. Abdominal: Soft and nontender. No distention. No guarding no rebound Back:  There is no focal tenderness or step off.  there is no  midline tenderness there are no lesions noted. there is no CVA tenderness Musculoskeletal: No lower extremity tenderness, index finger on the right hand swollen, there is good cap refill, there is tender to palpation in the extensor surface as well as the flexor surface there is no erythema coming from the finger or lymphogenic spread, patient has a great deal of pain when he looks his the finger and cannot do so.  It is erythematous on the posterior surface principally around the DIP joint.  There is no obvious joint effusions although is difficult to say for sure because of the soft tissue swelling.  no joint effusions, no DVT signs strong distal pulses no edema Neurologic:  Normal speech and language. No gross focal neurologic deficits are appreciated.  Skin:  Skin is warm, dry and intact. No rash  noted. Psychiatric: Mood and affect are normal. Speech and behavior are normal.  ____________________________________________   LABS (all labs ordered are listed, but only abnormal results are displayed)  Labs Reviewed  COMPREHENSIVE METABOLIC PANEL - Abnormal; Notable for the following components:      Result Value   CO2 18 (*)    Glucose, Bld 106 (*)    BUN 90 (*)    Creatinine, Ser 9.85 (*)    Total Protein 6.1 (*)    Albumin 3.4 (*)    AST 11 (*)    GFR calc non Af Amer 6 (*)    GFR calc Af Amer 7 (*)    All other components within normal limits  CBC WITH DIFFERENTIAL/PLATELET - Abnormal; Notable for the following components:   RBC 2.23 (*)    Hemoglobin 7.5 (*)    HCT 20.9 (*)    MCHC 36.1 (*)    RDW 15.4 (*)    Neutro Abs 8.5 (*)    Lymphs Abs 0.5 (*)    All other components within normal limits  CULTURE, BLOOD (ROUTINE X 2)  CULTURE, BLOOD (ROUTINE X 2)  LACTIC ACID, PLASMA  LACTIC ACID, PLASMA  VANCOMYCIN, RANDOM    Pertinent labs  results that were available during my care of the patient were reviewed by me and considered in my medical decision making (see chart for  details). ____________________________________________  EKG  I personally interpreted any EKGs ordered by me or triage  ____________________________________________  RADIOLOGY  Pertinent labs & imaging results that were available during my care of the patient were reviewed by me and considered in my medical decision making (see chart for details). If possible, patient and/or family made aware of any abnormal findings.  No results found. ____________________________________________    PROCEDURES  Procedure(s) performed: None  Procedures  Critical Care performed: None  ____________________________________________   INITIAL IMPRESSION / ASSESSMENT AND PLAN / ED COURSE  Pertinent labs & imaging results that were available during my care of the patient were reviewed by me and considered in my medical decision making (see chart for details).  Patient has pain swelling erythema and has had purulent discharge from his finger also had fevers at home.  He has been receiving vancomycin it appears most recently last Friday, there are multiple issues with this patient, the first is that he is immunocompromised and he has an infection is not getting better despite appropriate antibiosis, we will obtain blood cultures, I will start him on Zosyn and I have ordered vancomycin level prior to administering Vanco after consulting with our pharmacy.  The second issue is whether or not the procedure needs to be front of this.  Is not quite typical for flexor tenosynovitis is most of the symptoms are on the extensor surface and I suspect more likely there is an abscess in the extensor surface but I am reluctant to perform blind incision and drainage on this patient without orthopedic input.  I have therefore talked to Dr. Mack Guise, who graciously agrees to come and evaluate the patient.  Do appreciate the consult.  In the meantime we will give the patient pain control.  Other issues arise in his blood  work his BUN is 90, he is scheduled for dialysis today and did not get that he will need dialysis but not emergently is got no shortness of breath and his lungs are clear and his potassium is reassuring.  We will give him pain medications while we sort this out patient  may need to go to a facility with a hand surgeon based upon our consultation with orthopedics.  ----------------------------------------- 6:38 PM on 11/25/2017 -----------------------------------------  Awaiting ortho. vanco level noted.  ----------------------------------------- 7:53 PM on 11/25/2017 -----------------------------------------  Seen and evaluated by orthopedic surgery appreciate consult they do not feel any intervention is indicated aside from IV antibiotics at this time, they do not think he has a tenosynovitis or abscess, they would like to admit the patient under the hospital service for IV antibiotics.  We are managing the patient's blood pressure here, he will be admitted, hospitals agrees with management.    ____________________________________________   FINAL CLINICAL IMPRESSION(S) / ED DIAGNOSES  Final diagnoses:  None      This chart was dictated using voice recognition software.  Despite best efforts to proofread,  errors can occur which can change meaning.      Schuyler Amor, MD 11/25/17 1706    Schuyler Amor, MD 11/25/17 1839    Schuyler Amor, MD 11/25/17 [id number]

## 2017-11-25 NOTE — Progress Notes (Signed)
Pharmacy Antibiotic Note  Jeffrey Holloway is a 32 y.o. male recently started on vancomycin and cephalexin during dialysis for right index finger cellulitis admitted on 11/25/2017 with cellulitis.  Pharmacy has been consulted for continuation of vancomycin dosing.  Assessment/Plan: Zosyn 3.375 g EI q 12 hours.   Vancomycin random level is below goal of 15-25 mcg/ml. Will order vancomycin 750 mg iv once.   Will need to f/u HD schedule and order vancomycin dosing accordingly.   Height: 6' (182.9 cm) Weight: 170 lb (77.1 kg) IBW/kg (Calculated) : 77.6  Temp (24hrs), Avg:99.5 F (37.5 C), Min:99.5 F (37.5 C), Max:99.5 F (37.5 C)  Recent Labs  Lab 11/25/17 1555 11/25/17 1718  WBC 9.4  --   CREATININE 9.85*  --   LATICACIDVEN 0.5  --   VANCORANDOM  --  12    Estimated Creatinine Clearance: 11.6 mL/min (A) (by C-G formula based on SCr of 9.85 mg/dL (H)).    No Known Allergies  Antimicrobials this admission: vancomyicn 6/24 >>  Zosyn  6/24 >>   Dose adjustments this admission:   Microbiology results: 6/24 BCx: sent  Thank you for allowing pharmacy to be a part of this patient's care.  Napoleon Form 11/25/2017 9:13 PM

## 2017-11-25 NOTE — H&P (Signed)
[place] [facility] [place] NAME: Jeffrey Holloway    MR#:  [id number]  DATE OF BIRTH:  02-01-1986  DATE OF ADMISSION:  11/25/2017  PRIMARY CARE PHYSICIAN: Patient, No Pcp Per   REQUESTING/REFERRING PHYSICIAN: Mcshane  CHIEF COMPLAINT:  Right index finger infection  HISTORY OF PRESENT ILLNESS:  Jeffrey Holloway  is a 32 y.o. male with a known history of glabellar nephritis, end-stage renal disease on hemodialysis, chemotherapy for vascular disease Dr. Grayland Ormond as an outpatient when to get his dialysis today but as patient's finger infection is getting worse associated with the severe pain and worsening of the swelling patient is sent over to the emergency department.  Patient was getting IV vancomycin and cephalosporin during his hemodialysis for this finger cellulitis but has been getting worse.  Patient is started on IV Zosyn and vancomycin and orthopedic doctor is consulted and have hospitalist team is called to admit the patient.  PAST MEDICAL HISTORY:   Past Medical History:  Diagnosis Date  . Constipation   . Diarrhea   . Glomerulonephritis   . Hemorrhoids   . Kidney infection   . Nausea and vomiting   . Occult blood in stools   . Shortness of breath   . Sleep apnea     PAST SURGICAL HISTOIRY:   Past Surgical History:  Procedure Laterality Date  . APPENDECTOMY    . DIALYSIS/PERMA CATHETER INSERTION N/A 10/15/2017   Procedure: DIALYSIS/PERMA CATHETER INSERTION;  Surgeon: Katha Cabal, MD;  Location: [place] [facility];  Service: Cardiovascular;  Laterality: N/A;    SOCIAL HISTORY:   Social History   Tobacco Use  . Smoking status: Current Every Day Smoker    Packs/day: 0.25    Types: Cigarettes  . Smokeless tobacco: Never Used  Substance Use Topics  . Alcohol use: Yes    FAMILY HISTORY:  No family history on file.  DRUG ALLERGIES:  No Known Allergies  REVIEW OF SYSTEMS:  CONSTITUTIONAL: No fever, fatigue or  weakness.  EYES: No blurred or double vision.  EARS, NOSE, AND THROAT: No tinnitus or ear pain.  RESPIRATORY: No cough, shortness of breath, wheezing or hemoptysis.  CARDIOVASCULAR: No chest pain, orthopnea, edema.  GASTROINTESTINAL: No nausea, vomiting, diarrhea or abdominal pain.  GENITOURINARY: No dysuria, hematuria.  ENDOCRINE: No polyuria, nocturia,  HEMATOLOGY: No anemia, easy bruising or bleeding SKIN: No rash or lesion.  Right index finger with worsening of the swelling and pain and redness MUSCULOSKELETAL: No joint pain or arthritis.   NEUROLOGIC: No tingling, numbness, weakness.  PSYCHIATRY: No anxiety or depression.   MEDICATIONS AT HOME:   Prior to Admission medications   Medication Sig Start Date End Date Taking? Authorizing Provider  amLODipine (NORVASC) 10 MG tablet Take 1 tablet (10 mg total) by mouth daily. 10/18/17  Yes Epifanio Lesches, MD  calcium acetate (PHOSLO) 667 MG capsule Take 2 capsules (1,334 mg total) by mouth 3 (three) times daily with meals. 10/18/17  Yes Epifanio Lesches, MD  calcium carbonate (TUMS - DOSED IN MG ELEMENTAL CALCIUM) 500 MG chewable tablet Chew 1 tablet by mouth 2 (two) times daily.   Yes [provider]  cloNIDine (CATAPRES) 0.1 MG tablet Take 1 tablet (0.1 mg total) by mouth 2 (two) times daily. 10/18/17  Yes Epifanio Lesches, MD  losartan (COZAAR) 100 MG tablet Take 1 tablet (100 mg total) by mouth daily. 10/18/17  Yes Epifanio Lesches, MD  multivitamin (RENA-VIT) TABS tablet Take 1 tablet by  mouth at bedtime. 10/18/17  Yes Epifanio Lesches, MD  Nutritional Supplements (FEEDING SUPPLEMENT, NEPRO CARB STEADY,) LIQD Take 237 mLs by mouth 2 (two) times daily between meals. 10/18/17  Yes Epifanio Lesches, MD  omeprazole (PRILOSEC) 20 MG capsule Take 20 mg by mouth daily.   Yes [provider]  predniSONE (DELTASONE) 20 MG tablet Take 1.5 tablets (30 mg total) by mouth daily with breakfast. Patient taking  differently: Take 20 mg by mouth daily with breakfast.  10/19/17  Yes Epifanio Lesches, MD  oxyCODONE (OXY IR/ROXICODONE) 5 MG immediate release tablet Take 1-2 tablets (5-10 mg total) by mouth every 6 (six) hours as needed for moderate pain. Patient not taking: Reported on 11/25/2017 10/18/17   Epifanio Lesches, MD      VITAL SIGNS:  Blood pressure (!) 167/100, pulse 66, temperature 97.8 F (36.6 C), temperature source Oral, resp. rate 20, height 6' (1.829 m), weight 77.1 kg (170 lb), SpO2 98 %.  PHYSICAL EXAMINATION:  GENERAL:  32 y.o.-year-old patient lying in the bed with no acute distress. patient lying in the bed with no acute distress.  EYES: Pupils equal, round, reactive to light and accommodation. No scleral icterus. Extraocular muscles intact.  HEENT: Head atraumatic, normocephalic. Oropharynx and nasopharynx clear.  NECK:  Supple, no jugular venous distention. No thyroid enlargement, no tenderness.  LUNGS: Normal breath sounds bilaterally, no wheezing, rales,rhonchi or crepitation. No use of accessory muscles of respiration.  CARDIOVASCULAR: S1, S2 normal. No murmurs, rubs, or gallops.  ABDOMEN: Soft, nontender, nondistended. Bowel sounds present.Marland Kitchen  EXTREMITIES: No pedal edema, cyanosis, or clubbing.  Right index finger is edematous, tender with decreased range of motion NEUROLOGIC: Cranial nerves II through XII are intact. Muscle strength 5/5 in all extremities. Sensation intact. Gait not checked.  PSYCHIATRIC: The patient is alert and oriented x 3.  SKIN: No obvious rash, lesion, or ulcer.   LABORATORY PANEL:   CBC Recent Labs  Lab 11/25/17 1555  WBC 9.4  HGB 7.5*  HCT 20.9*  PLT 226   ------------------------------------------------------------------------------------------------------------------  Chemistries  Recent Labs  Lab 11/25/17 1555  NA 140  K 4.3  CL 110  CO2 18*  GLUCOSE 106*  BUN 90*  CREATININE 9.85*  CALCIUM 9.7  AST 11*  ALT 18  ALKPHOS 46  BILITOT 0.4    ------------------------------------------------------------------------------------------------------------------  Cardiac Enzymes No results for input(s): TROPONINI in the last 168 hours. ------------------------------------------------------------------------------------------------------------------  RADIOLOGY:  No results found.  EKG:   Orders placed or performed during the hospital encounter of 10/10/17  . EKG 12-Lead  . EKG 12-Lead  . ED EKG within 10 minutes  . ED EKG within 10 minutes  . EKG 12-Lead  . EKG 12-Lead  . EKG 12-Lead  . EKG 12-Lead  . EKG    IMPRESSION AND PLAN:   Jeffrey Holloway  is a 32 y.o. male with a known history of glabellar nephritis, end-stage renal disease on hemodialysis, chemotherapy for vascular disease Dr. Grayland Ormond as an outpatient when to get his dialysis today but as patient's finger infection is getting worse associated with the severe pain and worsening of the swelling patient is sent over to the emergency department.  Patient was getting IV vancomycin and cephalosporin during his hemodialysis for this finger cellulitis but has been getting worse.  #Right index finger cellulitis acute failed outpatient IV antibiotics Admit to MedSurg unit IV Zosyn and vancomycin Seen and evaluated by orthopedics Dr. Geanie Cooley who is not recommending I&D at this time.  Continue conservative management with IV antibiotics  #End-stage renal disease patient missed his  hemodialysis today Clinically stable ED physician discussed with nephrology who will do dialysis tomorrow and then continue Monday Wednesday Friday   #Hypertensive urgency Home medications Cozaar, amlodipine and clonidine will be continued  IV Lopressor as needed  #GERD   #vasculitis on chemotherapy continue the same Outpatient follow-up with Dr. Grayland Ormond as recommended Continue home medication prednisone p.o.    All the records are reviewed and case discussed with ED  provider. Management plans discussed with the patient, family and they are in agreement.  CODE STATUS: fc , wife hcpoa  TOTAL TIME TAKING CARE OF THIS PATIENT: 41 minutes.   Note: This dictation was prepared with Dragon dictation along with smaller phrase technology. Any transcriptional errors that result from this process are unintentional.  Nicholes Mango M.D on 11/25/2017 at 10:03 PM  Between 7am to 6pm - Pager - [phone]  After 6pm go to [email or web address] - password [email or web address] [facility]  Office  [phone]  CC: Primary care physician; Patient, No Pcp Per

## 2017-11-26 LAB — CBC
HCT: 20.1 % — ABNORMAL LOW (ref 40.0–52.0)
Hemoglobin: 7.1 g/dL — ABNORMAL LOW (ref 13.0–18.0)
MCH: 33.8 pg (ref 26.0–34.0)
MCHC: 35.5 g/dL (ref 32.0–36.0)
MCV: 95.4 fL (ref 80.0–100.0)
Platelets: 172 10*3/uL (ref 150–440)
RBC: 2.11 MIL/uL — ABNORMAL LOW (ref 4.40–5.90)
RDW: 15.7 % — AB (ref 11.5–14.5)
WBC: 7.9 10*3/uL (ref 3.8–10.6)

## 2017-11-26 LAB — COMPREHENSIVE METABOLIC PANEL
ALT: 14 U/L (ref 0–44)
ANION GAP: 11 (ref 5–15)
AST: 12 U/L — ABNORMAL LOW (ref 15–41)
Albumin: 2.9 g/dL — ABNORMAL LOW (ref 3.5–5.0)
Alkaline Phosphatase: 39 U/L (ref 38–126)
BUN: 96 mg/dL — ABNORMAL HIGH (ref 6–20)
CO2: 18 mmol/L — ABNORMAL LOW (ref 22–32)
Calcium: 9.4 mg/dL (ref 8.9–10.3)
Chloride: 111 mmol/L (ref 98–111)
Creatinine, Ser: 10.43 mg/dL — ABNORMAL HIGH (ref 0.61–1.24)
GFR calc Af Amer: 7 mL/min — ABNORMAL LOW (ref >60)
GFR, EST NON AFRICAN AMERICAN: 6 mL/min — AB (ref >60)
Glucose, Bld: 117 mg/dL — ABNORMAL HIGH (ref 70–99)
POTASSIUM: 3.9 mmol/L (ref 3.5–5.1)
Sodium: 140 mmol/L (ref 135–145)
TOTAL PROTEIN: 5.3 g/dL — AB (ref 6.5–8.1)
Total Bilirubin: 0.5 mg/dL (ref 0.3–1.2)

## 2017-11-26 LAB — MRSA PCR SCREENING: MRSA BY PCR: POSITIVE — AB

## 2017-11-26 MED ORDER — OXYCODONE-ACETAMINOPHEN 5-325 MG PO TABS
1.0000 | ORAL_TABLET | Freq: Four times a day (QID) | ORAL | Status: DC | PRN
  Administered 2017-11-26 – 2017-11-27 (×4): 1 via ORAL
  Filled 2017-11-26 (×4): qty 1

## 2017-11-26 MED ORDER — MUPIROCIN 2 % EX OINT
1.0000 "application " | TOPICAL_OINTMENT | Freq: Two times a day (BID) | CUTANEOUS | Status: DC
  Filled 2017-11-26 (×2): qty 22

## 2017-11-26 MED ORDER — VANCOMYCIN HCL IN DEXTROSE 750-5 MG/150ML-% IV SOLN: 750.0000 mg | INTRAVENOUS | Status: DC

## 2017-11-26 MED ORDER — VANCOMYCIN HCL IN DEXTROSE 1-5 GM/200ML-% IV SOLN
1000.0000 mg | INTRAVENOUS | Status: DC
  Administered 2017-11-27: 1000 mg via INTRAVENOUS
  Filled 2017-11-26 (×2): qty 200

## 2017-11-26 MED ORDER — CHLORHEXIDINE GLUCONATE CLOTH 2 % EX PADS
6.0000 | MEDICATED_PAD | Freq: Every day | CUTANEOUS | Status: DC
  Administered 2017-11-27: 6 via TOPICAL

## 2017-11-26 NOTE — Progress Notes (Signed)
[facility] [place] NAME: Jeffrey Holloway    MR#:  [id number]  DATE OF BIRTH:  1985/12/27  SUBJECTIVE:  CHIEF COMPLAINT:   Chief Complaint  Patient presents with  . Wound Infection  Patient feeling better, no complications, no events overnight, orthopedic surgery input appreciated  REVIEW OF SYSTEMS:  CONSTITUTIONAL: No fever, fatigue or weakness.  EYES: No blurred or double vision.  EARS, NOSE, AND THROAT: No tinnitus or ear pain.  RESPIRATORY: No cough, shortness of breath, wheezing or hemoptysis.  CARDIOVASCULAR: No chest pain, orthopnea, edema.  GASTROINTESTINAL: No nausea, vomiting, diarrhea or abdominal pain.  GENITOURINARY: No dysuria, hematuria.  ENDOCRINE: No polyuria, nocturia,  HEMATOLOGY: No anemia, easy bruising or bleeding SKIN: No rash or lesion. MUSCULOSKELETAL: No joint pain or arthritis.   NEUROLOGIC: No tingling, numbness, weakness.  PSYCHIATRY: No anxiety or depression.   ROS  DRUG ALLERGIES:  No Known Allergies  VITALS:  Blood pressure (!) 156/100, pulse (!) 57, temperature (!) 97.5 F (36.4 C), temperature source Oral, resp. rate 14, height 6' (1.829 m), weight 84.1 kg (185 lb 6.5 oz), SpO2 98 %.  PHYSICAL EXAMINATION:  GENERAL:  32 y.o.-year-old patient lying in the bed with no acute distress.  EYES: Pupils equal, round, reactive to light and accommodation. No scleral icterus. Extraocular muscles intact.  HEENT: Head atraumatic, normocephalic. Oropharynx and nasopharynx clear.  NECK:  Supple, no jugular venous distention. No thyroid enlargement, no tenderness.  LUNGS: Normal breath sounds bilaterally, no wheezing, rales,rhonchi or crepitation. No use of accessory muscles of respiration.  CARDIOVASCULAR: S1, S2 normal. No murmurs, rubs, or gallops.  ABDOMEN: Soft, nontender, nondistended. Bowel sounds present. No organomegaly or mass.  EXTREMITIES: No pedal edema, cyanosis, or clubbing.  NEUROLOGIC: Cranial  nerves II through XII are intact. Muscle strength 5/5 in all extremities. Sensation intact. Gait not checked.  PSYCHIATRIC: The patient is alert and oriented x 3.  SKIN: No obvious rash, lesion, or ulcer.   Physical Exam LABORATORY PANEL:   CBC Recent Labs  Lab 11/26/17 0501  WBC 7.9  HGB 7.1*  HCT 20.1*  PLT 172   ------------------------------------------------------------------------------------------------------------------  Chemistries  Recent Labs  Lab 11/26/17 0501  NA 140  K 3.9  CL 111  CO2 18*  GLUCOSE 117*  BUN 96*  CREATININE 10.43*  CALCIUM 9.4  AST 12*  ALT 14  ALKPHOS 39  BILITOT 0.5   ------------------------------------------------------------------------------------------------------------------  Cardiac Enzymes No results for input(s): TROPONINI in the last 168 hours. ------------------------------------------------------------------------------------------------------------------  RADIOLOGY:  No results found.  ASSESSMENT AND PLAN:  Jeffrey Holloway  is a 32 y.o. male with a known history of glabellar nephritis, end-stage renal disease on hemodialysis, chemotherapy for vascular disease Dr. Grayland Ormond as an outpatient when to get his dialysis today but as patient's finger infection is getting worse associated with the severe pain and worsening of the swelling patient is sent over to the emergency department.  Patient was getting IV vancomycin and cephalosporin during his hemodialysis for this finger cellulitis but has been getting worse.  *Acute right index finger cellulitis, failed outpatient IV antibiotics Resolving Continue empiric IV Zosyn and vancomycin, orthopedics/Dr. Geanie Cooley input appreciated-recommended continued conservative management with IV antibiotics   *Chronic end-stage renal disease Stable Nephrology input appreciated-consulted for hemodialysis Typically receives treatments on Monday Wednesday Friday   *Hypertensive  urgency Much improved on current regiment  *Chronic vasculitis, on chemotherapy To new current regiment and follow-up with oncology/Dr. Grayland Ormond as recommended  Disposition to home  in 1 to 2 days with clearance from orthopedic surgery   All the records are reviewed and case discussed with Care Management/Social Workerr. Management plans discussed with the patient, family and they are in agreement.  CODE STATUS: full  TOTAL TIME TAKING CARE OF THIS PATIENT: 35 minutes.     POSSIBLE D/C IN 1-2 DAYS, DEPENDING ON CLINICAL CONDITION.   Jeffrey Holloway Salary M.D on 11/26/2017   Between 7am to 6pm - Pager - [phone]  After 6pm go to [email or web address] - password [email or web address] [facility]  Office  [phone]  CC: Primary care physician; Patient, No Pcp Per  Note: This dictation was prepared with Dragon dictation along with smaller phrase technology. Any transcriptional errors that result from this process are unintentional.

## 2017-11-26 NOTE — Progress Notes (Signed)
Pharmacy Antibiotic Note  Jeffrey Holloway is a 32 y.o. male recently started on vancomycin and cephalexin during dialysis for right index finger cellulitis admitted on 11/25/2017 with cellulitis.  Pharmacy has been consulted for continuation of vancomycin dosing.  Assessment/Plan: Vancomycin random level is below goal of 15-25 mcg/ml. Vancomycin 750 mg iv once. Per vancomycin HD dosing guidelines we will follow that dose with 1000 mg MWF with HD, which is scheduled to begin 6/26. A vancomycin random level will be ordered prior to the 3rd dose on Friday 6/28   Height: 6' (182.9 cm) Weight: 185 lb 6.5 oz (84.1 kg) IBW/kg (Calculated) : 77.6  Temp (24hrs), Avg:98.2 F (36.8 C), Min:97.5 F (36.4 C), Max:99.5 F (37.5 C)  Recent Labs  Lab 11/25/17 1555 11/25/17 1718 11/26/17 0501  WBC 9.4  --  7.9  CREATININE 9.85*  --  10.43*  LATICACIDVEN 0.5  --   --   VANCORANDOM  --  12  --     Estimated Creatinine Clearance: 11.1 mL/min (A) (by C-G formula based on SCr of 10.43 mg/dL (H)).    No Known Allergies  Antimicrobials this admission: vancomyicn 6/24 >>  Zosyn  6/24 >>   Microbiology results: 6/24 BCx: pending MRSA PCR positive  Thank you for allowing pharmacy to be a part of this patient's care.  Dallie Piles, PharmD 11/26/2017 1:45 PM

## 2017-11-26 NOTE — Progress Notes (Signed)
Subjective:  Patient reports right index finger pain as moderate.  Patient believes the redness and swelling in the right index finger have improved overnight.  Objective:   VITALS:   Vitals:   11/26/17 0350 11/26/17 0500 11/26/17 1209 11/26/17 1214  BP: (!) 154/100  (!) 160/100 (!) 156/100  Pulse: 61  62 (!) 57  Resp: 18  14   Temp: 97.9 F (36.6 C)  (!) 97.5 F (36.4 C)   TempSrc: Oral  Oral   SpO2: 98%  99% 98%  Weight:  84.1 kg (185 lb 6.5 oz)    Height:  6' (1.829 m)      PHYSICAL EXAM: Right index finger: Skin is intact.  Eschar over DIP joint.  No active drainage.  Patient remains moderately tender over the dorsum of the right index finger.  Swelling extends only to the mid proximal phalanx and does not enter into the MCP joint or hand. Neurovascular intact Sensation intact distally Intact pulses distally  LABS  Results for orders placed or performed during the hospital encounter of 11/25/17 (from the past 24 hour(s))  Lactic acid, plasma     Status: None   Collection Time: 11/25/17  3:55 PM  Result Value Ref Range   Lactic Acid, Venous 0.5 0.5 - 1.9 mmol/L  Comprehensive metabolic panel     Status: Abnormal   Collection Time: 11/25/17  3:55 PM  Result Value Ref Range   Sodium 140 135 - 145 mmol/L   Potassium 4.3 3.5 - 5.1 mmol/L   Chloride 110 101 - 111 mmol/L   CO2 18 (L) 22 - 32 mmol/L   Glucose, Bld 106 (H) 65 - 99 mg/dL   BUN 90 (H) 6 - 20 mg/dL   Creatinine, Ser 9.85 (H) 0.61 - 1.24 mg/dL   Calcium 9.7 8.9 - 10.3 mg/dL   Total Protein 6.1 (L) 6.5 - 8.1 g/dL   Albumin 3.4 (L) 3.5 - 5.0 g/dL   AST 11 (L) 15 - 41 U/L   ALT 18 17 - 63 U/L   Alkaline Phosphatase 46 38 - 126 U/L   Total Bilirubin 0.4 0.3 - 1.2 mg/dL   GFR calc non Af Amer 6 (L) >60 mL/min   GFR calc Af Amer 7 (L) >60 mL/min   Anion gap 12 5 - 15  CBC with Differential     Status: Abnormal   Collection Time: 11/25/17  3:55 PM  Result Value Ref Range   WBC 9.4 3.8 - 10.6 K/uL   RBC  2.23 (L) 4.40 - 5.90 MIL/uL   Hemoglobin 7.5 (L) 13.0 - 18.0 g/dL   HCT 20.9 (L) 40.0 - 52.0 %   MCV 93.5 80.0 - 100.0 fL   MCH 33.7 26.0 - 34.0 pg   MCHC 36.1 (H) 32.0 - 36.0 g/dL   RDW 15.4 (H) 11.5 - 14.5 %   Platelets 226 150 - 440 K/uL   Neutrophils Relative % 90 %   Neutro Abs 8.5 (H) 1.4 - 6.5 K/uL   Lymphocytes Relative 5 %   Lymphs Abs 0.5 (L) 1.0 - 3.6 K/uL   Monocytes Relative 4 %   Monocytes Absolute 0.4 0.2 - 1.0 K/uL   Eosinophils Relative 0 %   Eosinophils Absolute 0.0 0 - 0.7 K/uL   Basophils Relative 1 %   Basophils Absolute 0.1 0 - 0.1 K/uL  Culture, blood (routine x 2)     Status: None (Preliminary result)   Collection Time: 11/25/17  4:56 PM  Result Value Ref Range   Specimen Description BLOOD BLOOD LEFT HAND    Special Requests      BOTTLES DRAWN AEROBIC AND ANAEROBIC Blood Culture adequate volume   Culture      NO GROWTH < 24 HOURS Performed at [facility], [address] [place], [place] [address]    Report Status PENDING   Culture, blood (routine x 2)     Status: None (Preliminary result)   Collection Time: 11/25/17  4:56 PM  Result Value Ref Range   Specimen Description BLOOD RIGHT WRIST    Special Requests      BOTTLES DRAWN AEROBIC AND ANAEROBIC Blood Culture results may not be optimal due to an excessive volume of blood received in culture bottles   Culture      NO GROWTH < 24 HOURS Performed at [facility], [address] [place], [place] [address]    Report Status PENDING   Vancomycin, random     Status: None   Collection Time: 11/25/17  5:18 PM  Result Value Ref Range   Vancomycin Rm 12   MRSA PCR Screening     Status: Abnormal   Collection Time: 11/25/17 10:43 PM  Result Value Ref Range   MRSA by PCR POSITIVE (A) NEGATIVE  CBC     Status: Abnormal   Collection Time: 11/26/17  5:01 AM  Result Value Ref Range   WBC 7.9 3.8 - 10.6 K/uL   RBC 2.11 (L) 4.40 - 5.90 MIL/uL   Hemoglobin 7.1 (L) 13.0 - 18.0 g/dL    HCT 20.1 (L) 40.0 - 52.0 %   MCV 95.4 80.0 - 100.0 fL   MCH 33.8 26.0 - 34.0 pg   MCHC 35.5 32.0 - 36.0 g/dL   RDW 15.7 (H) 11.5 - 14.5 %   Platelets 172 150 - 440 K/uL  Comprehensive metabolic panel     Status: Abnormal   Collection Time: 11/26/17  5:01 AM  Result Value Ref Range   Sodium 140 135 - 145 mmol/L   Potassium 3.9 3.5 - 5.1 mmol/L   Chloride 111 98 - 111 mmol/L   CO2 18 (L) 22 - 32 mmol/L   Glucose, Bld 117 (H) 70 - 99 mg/dL   BUN 96 (H) 6 - 20 mg/dL   Creatinine, Ser 10.43 (H) 0.61 - 1.24 mg/dL   Calcium 9.4 8.9 - 10.3 mg/dL   Total Protein 5.3 (L) 6.5 - 8.1 g/dL   Albumin 2.9 (L) 3.5 - 5.0 g/dL   AST 12 (L) 15 - 41 U/L   ALT 14 0 - 44 U/L   Alkaline Phosphatase 39 38 - 126 U/L   Total Bilirubin 0.5 0.3 - 1.2 mg/dL   GFR calc non Af Amer 6 (L) >60 mL/min   GFR calc Af Amer 7 (L) >60 mL/min   Anion gap 11 5 - 15    No results found.  Assessment/Plan:     Active Problems:   Cellulitis  Patient improving on IV antibiotics.  Continue IV Vanco and Zosyn.  Patient will be n.p.o. after midnight in case his clinical picture worsens overnight.  Would keep patient at least 1 more night for IV antibiotics to monitor his progress.  Would recommend infectious disease consultation for recommendations for oral antibiotics if discharge is planned.  Elevate right hand.  I will continue to follow.    Thornton Park , MD 11/26/2017, 3:51 PM

## 2017-11-26 NOTE — Progress Notes (Signed)
   11/26/17 1020  Clinical Encounter Type  Visited With Patient  Visit Type Initial   Chaplain visited with patient and provided education regarding Advanced Directives.  Chaplain answered patient's questions and he verbalized understanding.  Copy of document left with patient.  He would like to talk with his wife about it.  Patient instructed to ask the nurse to request chaplain to return if he decides to proceed and sign the document.

## 2017-11-26 NOTE — Progress Notes (Signed)
[facility]  ROUNDING NOTE   Subjective:   [name] admitted to [facility] on 11/25/2017 for Cellulitis, unspecified cellulitis site [L03.90]  Last hemodialysis was Friday.   Patient was treated for his finger abscess with Vancomycin on 6/19 and 6/21. And with ceftazidime on 6/21.    Objective:  Vital signs in last 24 hours:  Temp:  [97.5 F (36.4 C)-97.9 F (36.6 C)] 97.5 F (36.4 C) (06/25 1209) Pulse Rate:  [57-80] 57 (06/25 1214) Resp:  [14-20] 14 (06/25 1209) BP: (154-196)/(100-128) 156/100 (06/25 1214) SpO2:  [97 %-99 %] 98 % (06/25 1214) Weight:  [84.1 kg (185 lb 6.5 oz)] 84.1 kg (185 lb 6.5 oz) (06/25 0500)  Weight change:  Filed Weights   11/25/17 1551 11/26/17 0500  Weight: 77.1 kg (170 lb) 84.1 kg (185 lb 6.5 oz)    Intake/Output: No intake/output data recorded.   Intake/Output this shift:  Total I/O In: 50 [IV Piggyback:50] Out: -   Physical Exam: General: NAD, laying in bed  Head: Normocephalic, atraumatic. Moist oral mucosal membranes  Eyes: Anicteric, PERRL  Neck: Supple, trachea midline  Lungs:  Clear to auscultation  Heart: Regular rate and rhythm  Abdomen:  Soft, nontender,   Extremities: no peripheral edema.  Neurologic: Nonfocal, moving all four extremities  Skin: No lesions  Access: RIJ permcath    Basic Metabolic Panel: Recent Labs  Lab 11/25/17 1555 11/26/17 0501  NA 140 140  K 4.3 3.9  CL 110 111  CO2 18* 18*  GLUCOSE 106* 117*  BUN 90* 96*  CREATININE 9.85* 10.43*  CALCIUM 9.7 9.4    Liver Function Tests: Recent Labs  Lab 11/25/17 1555 11/26/17 0501  AST 11* 12*  ALT 18 14  ALKPHOS 46 39  BILITOT 0.4 0.5  PROT 6.1* 5.3*  ALBUMIN 3.4* 2.9*   No results for input(s): LIPASE, AMYLASE in the last 168 hours. No results for input(s): AMMONIA in the last 168 hours.  CBC: Recent Labs  Lab 11/25/17 1555 11/26/17 0501  WBC 9.4 7.9  NEUTROABS 8.5*  --   HGB 7.5* 7.1*  HCT 20.9* 20.1*  MCV 93.5  95.4  PLT 226 172    Cardiac Enzymes: No results for input(s): CKTOTAL, CKMB, CKMBINDEX, TROPONINI in the last 168 hours.  BNP: Invalid input(s): POCBNP  CBG: No results for input(s): GLUCAP in the last 168 hours.  Microbiology: Results for orders placed or performed during the hospital encounter of 11/25/17  Culture, blood (routine x 2)     Status: None (Preliminary result)   Collection Time: 11/25/17  4:56 PM  Result Value Ref Range Status   Specimen Description BLOOD BLOOD LEFT HAND  Final   Special Requests   Final    BOTTLES DRAWN AEROBIC AND ANAEROBIC Blood Culture adequate volume   Culture   Final    NO GROWTH < 24 HOURS Performed at [facility], [address] [place], [place] [address]    Report Status PENDING  Incomplete  Culture, blood (routine x 2)     Status: None (Preliminary result)   Collection Time: 11/25/17  4:56 PM  Result Value Ref Range Status   Specimen Description BLOOD RIGHT WRIST  Final   Special Requests   Final    BOTTLES DRAWN AEROBIC AND ANAEROBIC Blood Culture results may not be optimal due to an excessive volume of blood received in culture bottles   Culture   Final    NO GROWTH < 24 HOURS Performed  at [facility], [address] [place], [place] [address]    Report Status PENDING  Incomplete  MRSA PCR Screening     Status: Abnormal   Collection Time: 11/25/17 10:43 PM  Result Value Ref Range Status   MRSA by PCR POSITIVE (A) NEGATIVE Final    Comment:        The GeneXpert MRSA Assay (FDA approved for NASAL specimens only), is one component of a comprehensive MRSA colonization surveillance program. It is not intended to diagnose MRSA infection nor to guide or monitor treatment for MRSA infections. c/ CRYSTAL BASS @0030  11/26/17 [facility] Performed at [facility], [address] [place], [place] [address]     Coagulation Studies: No results for input(s): LABPROT, INR in the last 72  hours.  Urinalysis: No results for input(s): COLORURINE, LABSPEC, PHURINE, GLUCOSEU, HGBUR, BILIRUBINUR, KETONESUR, PROTEINUR, UROBILINOGEN, NITRITE, LEUKOCYTESUR in the last 72 hours.  Invalid input(s): APPERANCEUR    Imaging: No results found.   Medications:   . piperacillin-tazobactam (ZOSYN)  IV Stopped (11/26/17 0948)  . [START ON 11/27/2017] vancomycin     . amLODipine  10 mg Oral Daily  . calcium acetate  1,334 mg Oral TID WC  . calcium carbonate  1 tablet Oral BID  . [START ON 11/27/2017] Chlorhexidine Gluconate Cloth  6 each Topical Q0600  . cloNIDine  0.1 mg Oral BID  . feeding supplement (NEPRO CARB STEADY)  237 mL Oral BID BM  . heparin injection (subcutaneous)  5,000 Units Subcutaneous Q8H  . losartan  100 mg Oral Daily  . multivitamin  1 tablet Oral QHS  . mupirocin ointment  1 application Nasal BID  . pantoprazole  40 mg Oral Daily  . pneumococcal 23 valent vaccine  0.5 mL Intramuscular Tomorrow-1000  . predniSONE  30 mg Oral Q breakfast   acetaminophen **OR** acetaminophen, labetalol, ondansetron **OR** ondansetron (ZOFRAN) IV, oxyCODONE-acetaminophen, traMADol  Assessment/ Plan:  [name] is a 32 y.o. 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 Acute renal failure requiring long term hemodialysis. 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 Being treated with rituximab, cyclophosphamide and prednisone.  Missed hemodialysis yesterday. Will schedule hemodialysis for tomorrow.   2. Hypertension: elevated. Home regimen of clonidine, amlodipine. Not currently on an ACE-I/ARB.   3. Right Second Finger Abscess: failed outpatient antibiotic therapy. Appreciate ortho input. Empiric Vanco and Zosyn.   4. Anemia with chronic kidney disease: 7.1  normocytic.  - EPO with HD treatments.   5. Secondary Hyperparathyroidism: not currently on phos binders.     LOS: 1 Jeffrey Holloway 6/25/20194:13 PM

## 2017-11-26 NOTE — Care Management (Signed)
Amanda Morris dialysis liaison notified of admission.    

## 2017-11-27 LAB — CBC
HCT: 20.4 % — ABNORMAL LOW (ref 40.0–52.0)
HEMOGLOBIN: 7.1 g/dL — AB (ref 13.0–18.0)
MCH: 33.1 pg (ref 26.0–34.0)
MCHC: 34.8 g/dL (ref 32.0–36.0)
MCV: 95.2 fL (ref 80.0–100.0)
Platelets: 170 10*3/uL (ref 150–440)
RBC: 2.14 MIL/uL — ABNORMAL LOW (ref 4.40–5.90)
RDW: 15 % — AB (ref 11.5–14.5)
WBC: 8.8 10*3/uL (ref 3.8–10.6)

## 2017-11-27 LAB — RENAL FUNCTION PANEL
ALBUMIN: 2.8 g/dL — AB (ref 3.5–5.0)
ANION GAP: 10 (ref 5–15)
BUN: 109 mg/dL — ABNORMAL HIGH (ref 6–20)
CALCIUM: 9.3 mg/dL (ref 8.9–10.3)
CO2: 19 mmol/L — ABNORMAL LOW (ref 22–32)
Chloride: 108 mmol/L (ref 98–111)
Creatinine, Ser: 11.62 mg/dL — ABNORMAL HIGH (ref 0.61–1.24)
GFR calc Af Amer: 6 mL/min — ABNORMAL LOW (ref >60)
GFR, EST NON AFRICAN AMERICAN: 5 mL/min — AB (ref >60)
Glucose, Bld: 93 mg/dL (ref 70–99)
PHOSPHORUS: 5.3 mg/dL — AB (ref 2.5–4.6)
POTASSIUM: 4.5 mmol/L (ref 3.5–5.1)
SODIUM: 137 mmol/L (ref 135–145)

## 2017-11-27 MED ORDER — CEFDINIR 300 MG PO CAPS: 300.0000 mg | ORAL_CAPSULE | Freq: Two times a day (BID) | ORAL | 0 refills | Status: DC

## 2017-11-27 MED ORDER — TRAMADOL HCL 50 MG PO TABS: 50.0000 mg | ORAL_TABLET | Freq: Four times a day (QID) | ORAL | 0 refills | Status: DC | PRN

## 2017-11-27 MED ORDER — VANCOMYCIN HCL IN DEXTROSE 1-5 GM/200ML-% IV SOLN: 1000.0000 mg | INTRAVENOUS | 0 refills | Status: DC

## 2017-11-27 MED ORDER — MUPIROCIN 2 % EX OINT: 1.0000 "application " | TOPICAL_OINTMENT | Freq: Two times a day (BID) | CUTANEOUS | 0 refills | Status: DC

## 2017-11-27 MED ORDER — EPOETIN ALFA 10000 UNIT/ML IJ SOLN
10000.0000 [IU] | INTRAMUSCULAR | Status: DC
  Administered 2017-11-27: 10000 [IU] via INTRAVENOUS
  Filled 2017-11-27: qty 1

## 2017-11-27 NOTE — Progress Notes (Signed)
HD tx end    11/27/17 1328  Vital Signs  Pulse Rate 60  Pulse Rate Source Monitor  Resp 13  BP (!) 174/112  BP Location Left Arm  BP Method Automatic  Patient Position (if appropriate) Lying  Oxygen Therapy  SpO2 99 %  O2 Device Room Air  During Hemodialysis Assessment  Dialysis Fluid Bolus Normal Saline  Bolus Amount (mL) 250 mL  Intra-Hemodialysis Comments Tx completed

## 2017-11-27 NOTE — Discharge Instructions (Signed)

## 2017-11-27 NOTE — Progress Notes (Signed)
Pre HD assessment    11/27/17 0949  Neurological  Level of Consciousness Alert  Orientation Level Oriented X4  Respiratory  Respiratory Pattern Regular;Unlabored  Chest Assessment Chest expansion symmetrical  Cardiac  ECG Monitor Yes  Cardiac Rhythm SB  Antiarrhythmic device No  Vascular  R Radial Pulse +2  L Radial Pulse +2  Integumentary  Integumentary (WDL) X  Skin Color Appropriate for ethnicity  Musculoskeletal  Musculoskeletal (WDL) X  Generalized Weakness Yes  Assistive Device None  GU Assessment  Genitourinary (WDL) X  Genitourinary Symptoms  (HD)  Psychosocial  Psychosocial (WDL) WDL

## 2017-11-27 NOTE — Progress Notes (Signed)
HD tx start    11/27/17 0954  Vital Signs  Pulse Rate (!) 58  Pulse Rate Source Monitor  Resp 11  BP (!) 173/109  BP Location Left Arm  BP Method Automatic  Patient Position (if appropriate) Lying  Oxygen Therapy  SpO2 100 %  O2 Device Room Air  During Hemodialysis Assessment  Blood Flow Rate (mL/min) 400 mL/min  Arterial Pressure (mmHg) -140 mmHg  Venous Pressure (mmHg) 120 mmHg  Transmembrane Pressure (mmHg) 70 mmHg  Ultrafiltration Rate (mL/min) 290 mL/min  Dialysate Flow Rate (mL/min) 600 ml/min  Conductivity: Machine  14  HD Safety Checks Performed Yes  Dialysis Fluid Bolus Normal Saline  Bolus Amount (mL) 250 mL  Intra-Hemodialysis Comments Tx initiated

## 2017-11-27 NOTE — Discharge Summary (Signed)
[facility] NAME: Jeffrey Holloway    MR#:  [id number]  DATE OF BIRTH:  08-06-1985  DATE OF ADMISSION:  11/25/2017 ADMITTING PHYSICIAN: Nicholes Mango, MD  DATE OF DISCHARGE: No discharge date for patient encounter.  PRIMARY CARE PHYSICIAN: Patient, No Pcp Per    ADMISSION DIAGNOSIS:  Cellulitis, unspecified cellulitis site [S85.46]  DISCHARGE DIAGNOSIS:  Active Problems:   Cellulitis   SECONDARY DIAGNOSIS:   Past Medical History:  Diagnosis Date  . Constipation   . Diarrhea   . Glomerulonephritis   . Hemorrhoids   . Kidney infection   . Nausea and vomiting   . Occult blood in stools   . Shortness of breath   . Sleep apnea     HOSPITAL COURSE:  Jeffrey Holloway a31 y.o.malewith a known history ofglabellar nephritis, end-stage renal disease on hemodialysis, chemotherapy for vascular disease Dr. Grayland Holloway as an outpatient when to get his dialysis today but as patient's finger infection is getting worse associated with the severe pain and worsening of the swelling patient is sent over to the emergency department. Patient was getting IV vancomycin and cephalosporin during his hemodialysis for this finger cellulitis but has been getting worse.  *Acute right index finger cellulitis, failed outpatient IV antibiotics Resolving Treated with empiric IV Zosyn/vancomycin, orthopedics/Dr. Mack Guise  did see patient while in-house-recommended IV antibiotics/no operative intervention recommended, noted dramatic improvement in swelling/cellulitis during his short hospital stay, improve range of motion, and discussion with nephrology-will continue outpatient vancomycin with hemodialysis/transition to p.o. Omnicef twice daily to complete antibiotic course, follow with orthopedic surgery in 1 week for reevaluation s/p d/c   *Chronic end-stage renal disease Stable Nephrology did see patient while in house, received hemodialysis on  the day of discharge  Typically receives treatments on Monday Wednesday Friday   *Hypertensive urgency Much improved on current regiment  *Chronic vasculitis, on chemotherapy We will need to follow-up with oncology/Dr. Grayland Holloway as recommended  DISCHARGE CONDITIONS:   stable  CONSULTS OBTAINED:    DRUG ALLERGIES:  No Known Allergies  DISCHARGE MEDICATIONS:   Allergies as of 11/27/2017   No Known Allergies     Medication List    TAKE these medications   amLODipine 10 MG tablet Commonly known as:  NORVASC Take 1 tablet (10 mg total) by mouth daily.   calcium acetate 667 MG capsule Commonly known as:  PHOSLO Take 2 capsules (1,334 mg total) by mouth 3 (three) times daily with meals.   calcium carbonate 500 MG chewable tablet Commonly known as:  TUMS - dosed in mg elemental calcium Chew 1 tablet by mouth 2 (two) times daily.   cefdinir 300 MG capsule Commonly known as:  OMNICEF Take 1 capsule (300 mg total) by mouth 2 (two) times daily.   cloNIDine 0.1 MG tablet Commonly known as:  CATAPRES Take 1 tablet (0.1 mg total) by mouth 2 (two) times daily.   feeding supplement (NEPRO CARB STEADY) Liqd Take 237 mLs by mouth 2 (two) times daily between meals.   losartan 100 MG tablet Commonly known as:  COZAAR Take 1 tablet (100 mg total) by mouth daily.   multivitamin Tabs tablet Take 1 tablet by mouth at bedtime.   mupirocin ointment 2 % Commonly known as:  BACTROBAN Place 1 application into the nose 2 (two) times daily.   omeprazole 20 MG capsule Commonly known as:  PRILOSEC Take 20 mg by mouth daily.   oxyCODONE 5 MG immediate release tablet Commonly  known as:  Oxy IR/ROXICODONE Take 1-2 tablets (5-10 mg total) by mouth every 6 (six) hours as needed for moderate pain.   predniSONE 20 MG tablet Commonly known as:  DELTASONE Take 1.5 tablets (30 mg total) by mouth daily with breakfast. What changed:  how much to take   vancomycin 1-5 GM/200ML-%  Soln Commonly known as:  VANCOCIN Inject 200 mLs (1,000 mg total) into the vein every Monday, Wednesday, and Friday with hemodialysis. To be given with hemodialysis in outpatient setting        DISCHARGE INSTRUCTIONS:  If you experience worsening of your admission symptoms, develop shortness of breath, life threatening emergency, suicidal or homicidal thoughts you must seek medical attention immediately by calling 911 or calling your MD immediately  if symptoms less severe.  You Must read complete instructions/literature along with all the possible adverse reactions/side effects for all the Medicines you take and that have been prescribed to you. Take any new Medicines after you have completely understood and accept all the possible adverse reactions/side effects.   Please note  You were cared for by a hospitalist during your hospital stay. If you have any questions about your discharge medications or the care you received while you were in the hospital after you are discharged, you can call the unit and asked to speak with the hospitalist on call if the hospitalist that took care of you is not available. Once you are discharged, your primary care physician will handle any further medical issues. Please note that NO REFILLS for any discharge medications will be authorized once you are discharged, as it is imperative that you return to your primary care physician (or establish a relationship with a primary care physician if you do not have one) for your aftercare needs so that they can reassess your need for medications and monitor your lab values.    Today   CHIEF COMPLAINT:   Chief Complaint  Patient presents with  . Wound Infection    HISTORY OF PRESENT ILLNESS:  32 y.o. male with a known history of glabellar nephritis, end-stage renal disease on hemodialysis, chemotherapy for vascular disease Dr. Grayland Holloway as an outpatient when to get his dialysis today but as patient's finger  infection is getting worse associated with the severe pain and worsening of the swelling patient is sent over to the emergency department.  Patient was getting IV vancomycin and cephalosporin during his hemodialysis for this finger cellulitis but has been getting worse.  Patient is started on IV Zosyn and vancomycin and orthopedic doctor is consulted and have hospitalist team is called to admit the patient.  VITAL SIGNS:  Blood pressure (!) 144/98, pulse (!) 50, temperature 98 F (36.7 C), temperature source Oral, resp. rate 10, height 6' (1.829 m), weight 86.9 kg (191 lb 9.3 oz), SpO2 96 %.  I/O:    Intake/Output Summary (Last 24 hours) at 11/27/2017 1108 Last data filed at 11/27/2017 0400 Gross per 24 hour  Intake 760 ml  Output 650 ml  Net 110 ml    PHYSICAL EXAMINATION:  GENERAL:  32 y.o.-year-old patient lying in the bed with no acute distress.  EYES: Pupils equal, round, reactive to light and accommodation. No scleral icterus. Extraocular muscles intact.  HEENT: Head atraumatic, normocephalic. Oropharynx and nasopharynx clear.  NECK:  Supple, no jugular venous distention. No thyroid enlargement, no tenderness.  LUNGS: Normal breath sounds bilaterally, no wheezing, rales,rhonchi or crepitation. No use of accessory muscles of respiration.  CARDIOVASCULAR: S1, S2 normal. No  murmurs, rubs, or gallops.  ABDOMEN: Soft, non-tender, non-distended. Bowel sounds present. No organomegaly or mass.  EXTREMITIES: No pedal edema, cyanosis, or clubbing.  NEUROLOGIC: Cranial nerves II through XII are intact. Muscle strength 5/5 in all extremities. Sensation intact. Gait not checked.  PSYCHIATRIC: The patient is alert and oriented x 3.  SKIN: No obvious rash, lesion, or ulcer.   DATA REVIEW:   CBC Recent Labs  Lab 11/27/17 0921  WBC 8.8  HGB 7.1*  HCT 20.4*  PLT 170    Chemistries  Recent Labs  Lab 11/26/17 0501 11/27/17 0921  NA 140 137  K 3.9 4.5  CL 111 108  CO2 18* 19*   GLUCOSE 117* 93  BUN 96* 109*  CREATININE 10.43* 11.62*  CALCIUM 9.4 9.3  AST 12*  --   ALT 14  --   ALKPHOS 39  --   BILITOT 0.5  --     Cardiac Enzymes No results for input(s): TROPONINI in the last 168 hours.  Microbiology Results  Results for orders placed or performed during the hospital encounter of 11/25/17  Culture, blood (routine x 2)     Status: None (Preliminary result)   Collection Time: 11/25/17  4:56 PM  Result Value Ref Range Status   Specimen Description BLOOD BLOOD LEFT HAND  Final   Special Requests   Final    BOTTLES DRAWN AEROBIC AND ANAEROBIC Blood Culture adequate volume   Culture   Final    NO GROWTH 2 DAYS Performed at [facility], [address] [place], [place] [address]    Report Status PENDING  Incomplete  Culture, blood (routine x 2)     Status: None (Preliminary result)   Collection Time: 11/25/17  4:56 PM  Result Value Ref Range Status   Specimen Description BLOOD RIGHT WRIST  Final   Special Requests   Final    BOTTLES DRAWN AEROBIC AND ANAEROBIC Blood Culture results may not be optimal due to an excessive volume of blood received in culture bottles   Culture   Final    NO GROWTH 2 DAYS Performed at [facility], [address] [place], [place] [address]    Report Status PENDING  Incomplete  MRSA PCR Screening     Status: Abnormal   Collection Time: 11/25/17 10:43 PM  Result Value Ref Range Status   MRSA by PCR POSITIVE (A) NEGATIVE Final    Comment:        The GeneXpert MRSA Assay (FDA approved for NASAL specimens only), is one component of a comprehensive MRSA colonization surveillance program. It is not intended to diagnose MRSA infection nor to guide or monitor treatment for MRSA infections. c/ CRYSTAL BASS @0030  11/26/17 [facility] Performed at [facility], [address] [place], [place] [address]     RADIOLOGY:  No results found.  EKG:   Orders placed or performed during the hospital  encounter of 10/10/17  . EKG 12-Lead  . EKG 12-Lead  . ED EKG within 10 minutes  . ED EKG within 10 minutes  . EKG 12-Lead  . EKG 12-Lead  . EKG 12-Lead  . EKG 12-Lead  . EKG      Management plans discussed with the patient, family and they are in agreement.  CODE STATUS:     Code Status Orders  (From admission, onward)        Start     Ordered   11/25/17 2156  Full code  Continuous  11/25/17 2159    Code Status History    Date Active Date Inactive Code Status Order ID Comments User Context   10/10/2017 1338 10/18/2017 2059 Full Code [id number]  Arta Silence, MD ED      TOTAL TIME TAKING CARE OF THIS PATIENT: 45 minutes.    Avel Peace Salary M.D on 11/27/2017 at 11:08 AM  Between 7am to 6pm - Pager - [phone]  After 6pm go to [email or web address] - password [email or web address] [facility]  Office  [phone]  CC: Primary care physician; Patient, No Pcp Per   Note: This dictation was prepared with Dragon dictation along with smaller phrase technology. Any transcriptional errors that result from this process are unintentional.

## 2017-11-27 NOTE — Progress Notes (Signed)
[facility]  ROUNDING NOTE   Subjective:   Seen and examined on hemodialysis. Tolerating treatment well.     HEMODIALYSIS FLOWSHEET:  Blood Flow Rate (mL/min): 400 mL/min Arterial Pressure (mmHg): -150 mmHg Venous Pressure (mmHg): 120 mmHg Transmembrane Pressure (mmHg): 70 mmHg Ultrafiltration Rate (mL/min): 540 mL/min Dialysate Flow Rate (mL/min): 600 ml/min Conductivity: Machine : 14.2 Conductivity: Machine : 14.2 Dialysis Fluid Bolus: Normal Saline Bolus Amount (mL): 250 mL    Objective:  Vital signs in last 24 hours:  Temp:  [97.4 F (36.3 C)-98 F (36.7 C)] 98 F (36.7 C) (06/26 0948) Pulse Rate:  [47-65] 65 (06/26 1130) Resp:  [10-20] 15 (06/26 1130) BP: (144-178)/(97-109) 167/107 (06/26 1130) SpO2:  [96 %-100 %] 100 % (06/26 1130) Weight:  [86.9 kg (191 lb 9.3 oz)] 86.9 kg (191 lb 9.3 oz) (06/26 0948)  Weight change:  Filed Weights   11/25/17 1551 11/26/17 0500 11/27/17 0948  Weight: 77.1 kg (170 lb) 84.1 kg (185 lb 6.5 oz) 86.9 kg (191 lb 9.3 oz)    Intake/Output: I/O last 3 completed shifts: In: 57 [P.O.:240; NG/GT:470; IV Piggyback:100] Out: 650 [Urine:650]   Intake/Output this shift:  No intake/output data recorded.  Physical Exam: General: NAD, laying in bed  Head: Normocephalic, atraumatic. Moist oral mucosal membranes  Eyes: Anicteric, PERRL  Neck: Supple, trachea midline  Lungs:  Clear to auscultation  Heart: bradycardia  Abdomen:  Soft, nontender,   Extremities: no peripheral edema.  Neurologic: Nonfocal, moving all four extremities  Skin: No lesions  Access: RIJ permcath    Basic Metabolic Panel: Recent Labs  Lab 11/25/17 1555 11/26/17 0501 11/27/17 0921  NA 140 140 137  K 4.3 3.9 4.5  CL 110 111 108  CO2 18* 18* 19*  GLUCOSE 106* 117* 93  BUN 90* 96* 109*  CREATININE 9.85* 10.43* 11.62*  CALCIUM 9.7 9.4 9.3  PHOS  --   --  5.3*    Liver Function Tests: Recent Labs  Lab 11/25/17 1555 11/26/17 0501  11/27/17 0921  AST 11* 12*  --   ALT 18 14  --   ALKPHOS 46 39  --   BILITOT 0.4 0.5  --   PROT 6.1* 5.3*  --   ALBUMIN 3.4* 2.9* 2.8*   No results for input(s): LIPASE, AMYLASE in the last 168 hours. No results for input(s): AMMONIA in the last 168 hours.  CBC: Recent Labs  Lab 11/25/17 1555 11/26/17 0501 11/27/17 0921  WBC 9.4 7.9 8.8  NEUTROABS 8.5*  --   --   HGB 7.5* 7.1* 7.1*  HCT 20.9* 20.1* 20.4*  MCV 93.5 95.4 95.2  PLT 226 172 170    Cardiac Enzymes: No results for input(s): CKTOTAL, CKMB, CKMBINDEX, TROPONINI in the last 168 hours.  BNP: Invalid input(s): POCBNP  CBG: No results for input(s): GLUCAP in the last 168 hours.  Microbiology: Results for orders placed or performed during the hospital encounter of 11/25/17  Culture, blood (routine x 2)     Status: None (Preliminary result)   Collection Time: 11/25/17  4:56 PM  Result Value Ref Range Status   Specimen Description BLOOD BLOOD LEFT HAND  Final   Special Requests   Final    BOTTLES DRAWN AEROBIC AND ANAEROBIC Blood Culture adequate volume   Culture   Final    NO GROWTH 2 DAYS Performed at [facility], [address] [place], [place] [address]    Report Status PENDING  Incomplete  Culture, blood (routine  x 2)     Status: None (Preliminary result)   Collection Time: 11/25/17  4:56 PM  Result Value Ref Range Status   Specimen Description BLOOD RIGHT WRIST  Final   Special Requests   Final    BOTTLES DRAWN AEROBIC AND ANAEROBIC Blood Culture results may not be optimal due to an excessive volume of blood received in culture bottles   Culture   Final    NO GROWTH 2 DAYS Performed at [facility], [address] [place], [place] [address]    Report Status PENDING  Incomplete  MRSA PCR Screening     Status: Abnormal   Collection Time: 11/25/17 10:43 PM  Result Value Ref Range Status   MRSA by PCR POSITIVE (A) NEGATIVE Final    Comment:        The GeneXpert MRSA Assay  (FDA approved for NASAL specimens only), is one component of a comprehensive MRSA colonization surveillance program. It is not intended to diagnose MRSA infection nor to guide or monitor treatment for MRSA infections. c/ CRYSTAL BASS @0030  11/26/17 [facility] Performed at [facility], [address] [place], [place] [address]     Coagulation Studies: No results for input(s): LABPROT, INR in the last 72 hours.  Urinalysis: No results for input(s): COLORURINE, LABSPEC, PHURINE, GLUCOSEU, HGBUR, BILIRUBINUR, KETONESUR, PROTEINUR, UROBILINOGEN, NITRITE, LEUKOCYTESUR in the last 72 hours.  Invalid input(s): APPERANCEUR    Imaging: No results found.   Medications:   . piperacillin-tazobactam (ZOSYN)  IV 3.375 g (11/27/17 0418)  . vancomycin     . amLODipine  10 mg Oral Daily  . calcium acetate  1,334 mg Oral TID WC  . calcium carbonate  1 tablet Oral BID  . Chlorhexidine Gluconate Cloth  6 each Topical Q0600  . cloNIDine  0.1 mg Oral BID  . epoetin (EPOGEN/PROCRIT) injection  10,000 Units Intravenous Q M,W,F-HD  . feeding supplement (NEPRO CARB STEADY)  237 mL Oral BID BM  . heparin injection (subcutaneous)  5,000 Units Subcutaneous Q8H  . losartan  100 mg Oral Daily  . multivitamin  1 tablet Oral QHS  . mupirocin ointment  1 application Nasal BID  . pantoprazole  40 mg Oral Daily  . pneumococcal 23 valent vaccine  0.5 mL Intramuscular Tomorrow-1000  . predniSONE  30 mg Oral Q breakfast   acetaminophen **OR** acetaminophen, labetalol, ondansetron **OR** ondansetron (ZOFRAN) IV, oxyCODONE-acetaminophen, traMADol  Assessment/ Plan:  [name] is a 32 y.o. 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 Acute renal failure requiring long term hemodialysis. pauci immune ANCA associated  crescentic glomerulonephritis biopsy proven (10/14/17). Being treated with rituximab, cyclophosphamide and prednisone.  Hemodialysis treatment. Tolerating treatment well.    2. Hypertension: elevated. Home regimen of clonidine, amlodipine. Not currently on an ACE-I/ARB.  Bradycardia on examination  3. Right Second Finger Abscess: failed outpatient antibiotic therapy. Appreciate ortho input. Empiric Vanco and Zosyn.   4. Anemia with chronic kidney disease: 7.1 normocytic.  - EPO with HD treatments.   5. Secondary Hyperparathyroidism: not currently on phos binders.     LOS: 2 Jeffrey Holloway 6/26/201911:44 AM

## 2017-11-27 NOTE — Progress Notes (Signed)
Sent pharmacy a note to bring pt's medication to dialysis unit, to be given during tx.    11/27/17 0932  Hand-Off documentation  Report received from (Full Name) Stark Bray

## 2017-11-27 NOTE — Progress Notes (Signed)
Post HD assessment    11/27/17 1337  Neurological  Level of Consciousness Alert  Orientation Level Oriented X4  Respiratory  Respiratory Pattern Regular;Unlabored  Chest Assessment Chest expansion symmetrical  Cardiac  ECG Monitor Yes  Cardiac Rhythm SB  Antiarrhythmic device No  Vascular  R Radial Pulse +2  L Radial Pulse +2  Edema Facial;Generalized  Integumentary  Integumentary (WDL) X  Skin Color Appropriate for ethnicity  Musculoskeletal  Musculoskeletal (WDL) X  Generalized Weakness Yes  Assistive Device None  GU Assessment  Genitourinary (WDL) X  Genitourinary Symptoms  (HD)  Psychosocial  Psychosocial (WDL) WDL

## 2017-11-27 NOTE — Progress Notes (Signed)
  Subjective:  Patient clinically improving.  He states the swelling and redness are better.  He had hemodialysis today.  Patient reports right index finger pain as mild to moderate.    Objective:   VITALS:   Vitals:   11/27/17 1300 11/27/17 1315 11/27/17 1328 11/27/17 1338  BP: (!) 181/110 (!) 184/111 (!) 174/112 (!) 176/109  Pulse: 60 61 60 (!) 58  Resp: 10 13 13 12   Temp:    98.5 F (36.9 C)  TempSrc:    Oral  SpO2: 96% 100% 99% 97%  Weight:    85.6 kg (188 lb 11.4 oz)  Height:        PHYSICAL EXAM: Right index finger: Patient's erythema is nearly resolved.  His swelling is dramatically improved.  The creases over the DIP and PIP joints on the dorsum of his finger can now easily be appreciated.  Patient has active flexion extension of the DIP PIP and MP joints without significant pain.  His range of motion is still somewhat limited due to residual swelling.  He has intact sensation light touch and still has mild to moderate tenderness over the dorsum of his finger.   LABS  Results for orders placed or performed during the hospital encounter of 11/25/17 (from the past 24 hour(s))  Renal function panel     Status: Abnormal   Collection Time: 11/27/17  9:21 AM  Result Value Ref Range   Sodium 137 135 - 145 mmol/L   Potassium 4.5 3.5 - 5.1 mmol/L   Chloride 108 98 - 111 mmol/L   CO2 19 (L) 22 - 32 mmol/L   Glucose, Bld 93 70 - 99 mg/dL   BUN 109 (H) 6 - 20 mg/dL   Creatinine, Ser 11.62 (H) 0.61 - 1.24 mg/dL   Calcium 9.3 8.9 - 10.3 mg/dL   Phosphorus 5.3 (H) 2.5 - 4.6 mg/dL   Albumin 2.8 (L) 3.5 - 5.0 g/dL   GFR calc non Af Amer 5 (L) >60 mL/min   GFR calc Af Amer 6 (L) >60 mL/min   Anion gap 10 5 - 15  CBC     Status: Abnormal   Collection Time: 11/27/17  9:21 AM  Result Value Ref Range   WBC 8.8 3.8 - 10.6 K/uL   RBC 2.14 (L) 4.40 - 5.90 MIL/uL   Hemoglobin 7.1 (L) 13.0 - 18.0 g/dL   HCT 20.4 (L) 40.0 - 52.0 %   MCV 95.2 80.0 - 100.0 fL   MCH 33.1 26.0 - 34.0 pg   MCHC 34.8 32.0 - 36.0 g/dL   RDW 15.0 (H) 11.5 - 14.5 %   Platelets 170 150 - 440 K/uL    No results found.  Assessment/Plan:     Active Problems:   Cellulitis  Patient being discharged home today on IV vancomycin during his hemodialysis Monday Wednesday and Fridays.  We will follow-up in my office in 1 week.  I have requested the patient make his appointment for either Tuesday or Thursday when our hand surgeon is in the office.    Thornton Park , MD 11/27/2017, 5:53 PM

## 2017-11-27 NOTE — Progress Notes (Signed)
Post HD assessment. Pt tolerated tx well without c/o or complication. Net UF 1013, goal met. Cath dressing changed.    11/27/17 1338  Vital Signs  Temp 98.5 F (36.9 C)  Temp Source Oral  Pulse Rate (!) 58  Pulse Rate Source Monitor  Resp 12  BP (!) 176/109  BP Location Left Arm  BP Method Automatic  Patient Position (if appropriate) Lying  Oxygen Therapy  SpO2 97 %  O2 Device Room Air  Dialysis Weight  Weight 85.6 kg (188 lb 11.4 oz)  Type of Weight Post-Dialysis  Post-Hemodialysis Assessment  Rinseback Volume (mL) 250 mL  KECN 79.6 V  Dialyzer Clearance Lightly streaked  Duration of HD Treatment -hour(s) 3.5 hour(s)  Hemodialysis Intake (mL) 700 mL  UF Total -Machine (mL) 1713 mL  Net UF (mL) 1013 mL  Tolerated HD Treatment Yes  Education / Care Plan  Dialysis Education Provided Yes  Documented Education in Care Plan Yes

## 2017-11-27 NOTE — Progress Notes (Signed)
Pre HD assessment    11/27/17 0948  Vital Signs  Temp 98 F (36.7 C)  Temp Source Oral  Pulse Rate (!) 55  Pulse Rate Source Monitor  Resp 13  BP (!) 178/108  BP Location Left Arm  BP Method Automatic  Patient Position (if appropriate) Lying  Oxygen Therapy  SpO2 100 %  O2 Device Room Air  Pain Assessment  Pain Scale 0-10  Pain Score 0  Dialysis Weight  Weight 86.9 kg (191 lb 9.3 oz)  Type of Weight Pre-Dialysis  Time-Out for Hemodialysis  What Procedure? HD  Pt Identifiers(min of two) First/Last Name;MRN/Account#  Correct Site? Yes  Correct Side? Yes  Correct Procedure? Yes  Consents Verified? Yes  Rad Studies Available? N/A  Safety Precautions Reviewed? Yes  Engineer, civil (consulting) Number  ([id number])  Station Number 1  UF/Alarm Test Passed  Conductivity: Meter 13.8  Conductivity: Machine  14  pH 7.6  Reverse Osmosis main  Normal Saline Lot Number [id number]  Dialyzer Lot Number [id number]  Disposable Set Lot Number [id number]  Machine Temperature 98.6 F (37 C)  Musician and Audible Yes  Blood Lines Intact and Secured Yes  Pre Treatment Patient Checks  Vascular access used during treatment Catheter  Hepatitis B Surface Antigen Results Negative  Date Hepatitis B Surface Antigen Drawn 10/10/17  Hepatitis B Surface Antibody  (>10)  Date Hepatitis B Surface Antibody Drawn 10/10/17  Hemodialysis Consent Verified Yes  Hemodialysis Standing Orders Initiated Yes  ECG (Telemetry) Monitor On Yes  Prime Ordered Normal Saline  Length of  DialysisTreatment -hour(s) 3.5 Hour(s)  Dialyzer Elisio 17H NR  Dialysate 3K, 2.5 Ca  Dialysis Anticoagulant None  Dialysate Flow Ordered 600  Blood Flow Rate Ordered 400 mL/min  Ultrafiltration Goal 0.5 Liters  Pre Treatment Labs Renal panel;CBC  Dialysis Blood Pressure Support Ordered Normal Saline  Education / Care Plan  Dialysis Education Provided Yes  Documented Education in Care Plan Yes

## 2017-11-30 LAB — CULTURE, BLOOD (ROUTINE X 2)
Culture: NO GROWTH
Culture: NO GROWTH
SPECIAL REQUESTS: ADEQUATE

## 2017-12-02 ENCOUNTER — Ambulatory Visit (INDEPENDENT_AMBULATORY_CARE_PROVIDER_SITE_OTHER): Payer: Self-pay | Admitting: Vascular Surgery

## 2017-12-02 ENCOUNTER — Encounter (INDEPENDENT_AMBULATORY_CARE_PROVIDER_SITE_OTHER): Payer: Self-pay

## 2017-12-30 ENCOUNTER — Encounter: Payer: Self-pay | Admitting: Emergency Medicine

## 2017-12-30 ENCOUNTER — Other Ambulatory Visit: Payer: Self-pay

## 2017-12-30 ENCOUNTER — Emergency Department
Admission: EM | Admit: 2017-12-30 | Discharge: 2017-12-30 | Disposition: A | Discharge status: Home / Self Care | Payer: BLUE CROSS/BLUE SHIELD | Attending: Emergency Medicine | Admitting: Emergency Medicine

## 2017-12-30 DIAGNOSIS — R51 Headache: Secondary | ICD-10-CM | POA: Diagnosis present

## 2017-12-30 DIAGNOSIS — Z992 Dependence on renal dialysis: Secondary | ICD-10-CM | POA: Insufficient documentation

## 2017-12-30 DIAGNOSIS — N186 End stage renal disease: Secondary | ICD-10-CM | POA: Insufficient documentation

## 2017-12-30 DIAGNOSIS — H538 Other visual disturbances: Secondary | ICD-10-CM | POA: Diagnosis not present

## 2017-12-30 DIAGNOSIS — Z79899 Other long term (current) drug therapy: Secondary | ICD-10-CM | POA: Insufficient documentation

## 2017-12-30 DIAGNOSIS — F17210 Nicotine dependence, cigarettes, uncomplicated: Secondary | ICD-10-CM | POA: Insufficient documentation

## 2017-12-30 DIAGNOSIS — Y841 Kidney dialysis as the cause of abnormal reaction of the patient, or of later complication, without mention of misadventure at the time of the procedure: Secondary | ICD-10-CM

## 2017-12-30 DIAGNOSIS — G4489 Other headache syndrome: Secondary | ICD-10-CM

## 2017-12-30 LAB — CBC WITH DIFFERENTIAL/PLATELET
Band Neutrophils: 0 %
Basophils Absolute: 0 10*3/uL (ref 0–0.1)
Basophils Relative: 0 %
Blasts: 0 %
EOS ABS: 0 10*3/uL (ref 0–0.7)
Eosinophils Relative: 0 %
HCT: 25.4 % — ABNORMAL LOW (ref 40.0–52.0)
Hemoglobin: 8.8 g/dL — ABNORMAL LOW (ref 13.0–18.0)
LYMPHS ABS: 1.2 10*3/uL (ref 1.0–3.6)
Lymphocytes Relative: 17 %
MCH: 32.1 pg (ref 26.0–34.0)
MCHC: 34.9 g/dL (ref 32.0–36.0)
MCV: 92.1 fL (ref 80.0–100.0)
Metamyelocytes Relative: 0 %
Monocytes Absolute: 0.6 10*3/uL (ref 0.2–1.0)
Monocytes Relative: 9 %
Myelocytes: 0 %
NEUTROS PCT: 74 %
Neutro Abs: 5 10*3/uL (ref 1.4–6.5)
Other: 0 %
Platelets: 138 10*3/uL — ABNORMAL LOW (ref 150–440)
Promyelocytes Relative: 0 %
RBC: 2.75 MIL/uL — AB (ref 4.40–5.90)
RDW: 16.2 % — AB (ref 11.5–14.5)
WBC: 6.8 10*3/uL (ref 3.8–10.6)
nRBC: 0 /100 WBC

## 2017-12-30 LAB — COMPREHENSIVE METABOLIC PANEL
ALT: 15 U/L (ref 0–44)
AST: 31 U/L (ref 15–41)
Albumin: 3.9 g/dL (ref 3.5–5.0)
Alkaline Phosphatase: 41 U/L (ref 38–126)
Anion gap: 13 (ref 5–15)
BUN: 27 mg/dL — AB (ref 6–20)
CO2: 31 mmol/L (ref 22–32)
CREATININE: 5.04 mg/dL — AB (ref 0.61–1.24)
Calcium: 8.1 mg/dL — ABNORMAL LOW (ref 8.9–10.3)
Chloride: 97 mmol/L — ABNORMAL LOW (ref 98–111)
GFR calc Af Amer: 16 mL/min — ABNORMAL LOW (ref >60)
GFR calc non Af Amer: 14 mL/min — ABNORMAL LOW (ref >60)
Glucose, Bld: 109 mg/dL — ABNORMAL HIGH (ref 70–99)
POTASSIUM: 3.9 mmol/L (ref 3.5–5.1)
Sodium: 141 mmol/L (ref 135–145)
Total Bilirubin: 0.8 mg/dL (ref 0.3–1.2)
Total Protein: 6.3 g/dL — ABNORMAL LOW (ref 6.5–8.1)

## 2017-12-30 MED ORDER — GI COCKTAIL ~~LOC~~
30.0000 mL | Freq: Once | ORAL | Status: AC
  Administered 2017-12-30: 30 mL via ORAL
  Filled 2017-12-30: qty 30

## 2017-12-30 MED ORDER — MORPHINE SULFATE (PF) 4 MG/ML IV SOLN
4.0000 mg | Freq: Once | INTRAVENOUS | Status: AC
  Administered 2017-12-30: 4 mg via INTRAVENOUS
  Filled 2017-12-30: qty 1

## 2017-12-30 MED ORDER — ACETAMINOPHEN 500 MG PO TABS
1000.0000 mg | ORAL_TABLET | Freq: Once | ORAL | Status: DC
  Filled 2017-12-30: qty 2

## 2017-12-30 MED ORDER — CLONIDINE HCL 0.1 MG PO TABS: 0.1000 mg | ORAL_TABLET | Freq: Once | ORAL | Status: DC

## 2017-12-30 MED ORDER — HYDRALAZINE HCL 25 MG PO TABS: 25.0000 mg | ORAL_TABLET | Freq: Three times a day (TID) | ORAL | 1 refills | Status: DC

## 2017-12-30 MED ORDER — ONDANSETRON HCL 4 MG/2ML IJ SOLN
4.0000 mg | Freq: Once | INTRAMUSCULAR | Status: AC
  Administered 2017-12-30: 4 mg via INTRAVENOUS
  Filled 2017-12-30: qty 2

## 2017-12-30 MED ORDER — HYDRALAZINE HCL 50 MG PO TABS
25.0000 mg | ORAL_TABLET | Freq: Once | ORAL | Status: AC
  Administered 2017-12-30: 25 mg via ORAL
  Filled 2017-12-30: qty 1

## 2017-12-30 MED ORDER — CLONIDINE HCL 0.1 MG PO TABS
0.1000 mg | ORAL_TABLET | Freq: Once | ORAL | Status: AC
  Administered 2017-12-30: 0.1 mg via ORAL
  Filled 2017-12-30: qty 1

## 2017-12-30 NOTE — ED Provider Notes (Signed)
[facility] Emergency Department Provider Note  ____________________________________________  Time seen: Approximately 7:09 PM  I have reviewed the triage vital signs and the nursing notes.   HISTORY  Chief Complaint Headache   HPI Jeffrey Holloway is a 32 y.o. 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  Patient reports that he has been a dialysis patient for 3 months.  Over the last month every time he undergoes dialysis for more than 3 hours he develops a headache.  Headaches located on the right side of his head, dull on the frontal aspect and sharp in the occipital region radiating down his neck.  The pain usually starts mild and becomes progressively worse.  Pain is 10/10 now. The pain usually lasts all day until patient is able to take Tylenol and go to sleep.  The pain today was identical to the other episodes.  Today he was dialyzed for 4 hours.  He reports that if he gets off dialysis machine at the 3-hour mark usually the headache does not come.  He has not had a chance to discuss this with his nephrologist.  He reports mild blurry vision on the right eye associated with a headache as well which usually resolves once the headache resolves.  No nausea, vomiting, thunderclap headache, facial droop, slurred speech, weakness or numbness of his extremities.  Patient has had no fever or chills.  No prior history of headaches before dialysis. He took tylenol 2 hours ago and the HA is not improving.  Past Medical History:  Diagnosis Date  . Constipation   . Diarrhea   . Glomerulonephritis   . Hemorrhoids   . Kidney infection   . Nausea and vomiting   . Occult blood in stools   . Shortness of breath   . Sleep apnea     Patient Active Problem List   Diagnosis Date Noted  . Cellulitis 11/25/2017  . 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 Acute renal failure (ARF) ([place]) 10/10/2017  . GERD  (gastroesophageal reflux disease) 10/10/2017  . Rectal foreign body     Past Surgical History:  Procedure Laterality Date  . APPENDECTOMY    . DIALYSIS/PERMA CATHETER INSERTION N/A 10/15/2017   Procedure: DIALYSIS/PERMA CATHETER INSERTION;  Surgeon: Katha Cabal, MD;  Location: [place] [facility];  Service: Cardiovascular;  Laterality: N/A;    Prior to Admission medications   Medication Sig Start Date End Date Taking? Authorizing Provider  amLODipine (NORVASC) 10 MG tablet Take 1 tablet (10 mg total) by mouth daily. 10/18/17   Epifanio Lesches, MD  calcium acetate (PHOSLO) 667 MG capsule Take 2 capsules (1,334 mg total) by mouth 3 (three) times daily with meals. 10/18/17   Epifanio Lesches, MD  calcium carbonate (TUMS - DOSED IN MG ELEMENTAL CALCIUM) 500 MG chewable tablet Chew 1 tablet by mouth 2 (two) times daily.    [provider]  cefdinir (OMNICEF) 300 MG capsule Take 1 capsule (300 mg total) by mouth 2 (two) times daily. 11/27/17   Salary, Avel Peace, MD  cloNIDine (CATAPRES) 0.1 MG tablet Take 1 tablet (0.1 mg total) by mouth 2 (two) times daily. 10/18/17   Epifanio Lesches, MD  hydrALAZINE (APRESOLINE) 25 MG tablet Take 1 tablet (25 mg total) by mouth 3 (three) times daily. 12/30/17 12/30/18  Rudene Re, MD  losartan (COZAAR) 100 MG tablet Take 1 tablet (100 mg total) by mouth daily. 10/18/17   Epifanio Lesches,  MD  multivitamin (RENA-VIT) TABS tablet Take 1 tablet by mouth at bedtime. 10/18/17   Epifanio Lesches, MD  mupirocin ointment (BACTROBAN) 2 % Place 1 application into the nose 2 (two) times daily. 11/27/17   Salary, Avel Peace, MD  Nutritional Supplements (FEEDING SUPPLEMENT, NEPRO CARB STEADY,) LIQD Take 237 mLs by mouth 2 (two) times daily between meals. 10/18/17   Epifanio Lesches, MD  omeprazole (PRILOSEC) 20 MG capsule Take 20 mg by mouth daily.    [provider]  oxyCODONE (OXY IR/ROXICODONE) 5 MG immediate release tablet  Take 1-2 tablets (5-10 mg total) by mouth every 6 (six) hours as needed for moderate pain. Patient not taking: Reported on 11/25/2017 10/18/17   Epifanio Lesches, MD  predniSONE (DELTASONE) 20 MG tablet Take 1.5 tablets (30 mg total) by mouth daily with breakfast. Patient taking differently: Take 20 mg by mouth daily with breakfast.  10/19/17   Epifanio Lesches, MD  traMADol (ULTRAM) 50 MG tablet Take 1 tablet (50 mg total) by mouth every 6 (six) hours as needed for moderate pain. 11/27/17   Salary, Avel Peace, MD    Allergies Patient has no known allergies.  No family history on file.  Social History Social History   Tobacco Use  . Smoking status: Current Every Day Smoker    Packs/day: 0.25    Types: Cigarettes  . Smokeless tobacco: Never Used  Substance Use Topics  . Alcohol use: Yes  . Drug use: Yes    Types: Marijuana, Cocaine    Review of Systems  Constitutional: Negative for fever. Eyes: Negative for visual changes. ENT: Negative for sore throat. Neck: No neck pain  Cardiovascular: Negative for chest pain. Respiratory: Negative for shortness of breath. Gastrointestinal: Negative for abdominal pain, vomiting or diarrhea. Genitourinary: Negative for dysuria. Musculoskeletal: Negative for back pain. Skin: Negative for rash. Neurological: + headaches. No weakness or numbness. Psych: No SI or HI  ____________________________________________   PHYSICAL EXAM:  VITAL SIGNS: ED Triage Vitals  Enc Vitals Group     BP 12/30/17 1625 (!) 161/110     Pulse Rate 12/30/17 1625 82     Resp 12/30/17 1625 18     Temp 12/30/17 1625 98.3 F (36.8 C)     Temp Source 12/30/17 1625 Oral     SpO2 12/30/17 1625 96 %     Weight 12/30/17 1628 180 lb (81.6 kg)     Height 12/30/17 1628 6' (1.829 m)     Head Circumference --      Peak Flow --      Pain Score 12/30/17 1628 7     Pain Loc --      Pain Edu? --      Excl. in [place]? --     Constitutional: Alert and oriented. Well  appearing and in no apparent distress. HEENT:      Head: Normocephalic and atraumatic.         Eyes: Conjunctivae are normal. Sclera is non-icteric.       Mouth/Throat: Mucous membranes are moist.       Neck: Supple with no signs of meningismus. Cardiovascular: Regular rate and rhythm. No murmurs, gallops, or rubs. 2+ symmetrical distal pulses are present in all extremities. No JVD. Respiratory: Normal respiratory effort. Lungs are clear to auscultation bilaterally. No wheezes, crackles, or rhonchi.  Gastrointestinal: Soft, non tender, and non distended with positive bowel sounds. No rebound or guarding. Musculoskeletal: Nontender with normal range of motion in all extremities. No edema, cyanosis, or  erythema of extremities. Neurologic: Normal speech and language. A & O x3, PERRL, EOMI, no nystagmus, CN II-XII intact, motor testing reveals good tone and bulk throughout. There is no evidence of pronator drift or dysmetria. Muscle strength is 5/5 throughout. Deep tendon reflexes are 2+ throughout with downgoing toes. Sensory examination is intact. Gait is normal. Skin: Skin is warm, dry and intact. No rash noted. Psychiatric: Mood and affect are normal. Speech and behavior are normal.  ____________________________________________   LABS (all labs ordered are listed, but only abnormal results are displayed)  Labs Reviewed  CBC WITH DIFFERENTIAL/PLATELET - Abnormal; Notable for the following components:      Result Value   RBC 2.75 (*)    Hemoglobin 8.8 (*)    HCT 25.4 (*)    RDW 16.2 (*)    Platelets 138 (*)    All other components within normal limits  COMPREHENSIVE METABOLIC PANEL - Abnormal; Notable for the following components:   Chloride 97 (*)    Glucose, Bld 109 (*)    BUN 27 (*)    Creatinine, Ser 5.04 (*)    Calcium 8.1 (*)    Total Protein 6.3 (*)    GFR calc non Af Amer 14 (*)    GFR calc Af Amer 16 (*)    All other components within normal limits    ____________________________________________  EKG  none  ____________________________________________  RADIOLOGY  none  ____________________________________________   PROCEDURES  Procedure(s) performed: None Procedures Critical Care performed:  None ____________________________________________   INITIAL IMPRESSION / ASSESSMENT AND PLAN / ED COURSE   32 y.o. 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  Patient has had the same exact headache for over a month every time he is dialyzed for more than 3 hours.  Patient is well-appearing, is hypertensive with BP of 161/110 in the setting of severe pain, remainder of his vital signs are within normal limits, patient is completely neurologically intact.  Concerns for possible overdiuresis.  Will give morphine and Zofran for pain.  Will discuss with patient's nephrologist.  Clinical Course as of Dec 31 2139  Mon Dec 30, 2017  2138 Discussed patient's presentation with Dr. Zollie Scale his nephrologist who will adjust his dialysis starting on Wednesday and will see patient for a follow-up on Wednesday as well.  He recommended started patient on hydralazine.  Patient missed his evening dose of clonidine.  He was given a dose of hydralazine and clonidine and the blood pressure is coming down.  He continues to be neurologically intact and extremely well-appearing otherwise.  Dr. Zollie Scale reports that this is very common for patients when they start dialysis.  I discussed return precautions with patient for any signs of stroke, thunderclap headache.  Otherwise he will follow-up with his nephrologist.   [CV]    Clinical Course User Index [CV] Alfred Levins [place], MD     As part of my medical decision making, I reviewed the following data within the [facility] notes reviewed and incorporated, Labs reviewed , Old chart reviewed, A consult was requested and obtained from  this/these consultant(s) Nephrology, Notes from prior ED visits and [place] Controlled Substance Database    Pertinent labs & imaging results that were available during my care of the patient were reviewed by me and considered in my medical decision making (see chart for details).    ____________________________________________   FINAL CLINICAL IMPRESSION(S) / ED DIAGNOSES  Final  diagnoses:  Dialysis headache      NEW MEDICATIONS STARTED DURING THIS VISIT:  ED Discharge Orders        Ordered    hydrALAZINE (APRESOLINE) 25 MG tablet  3 times daily     12/30/17 2140       Note:  This document was prepared using Dragon voice recognition software and may include unintentional dictation errors.    Rudene Re, MD 12/30/17 2146

## 2017-12-30 NOTE — ED Notes (Signed)
Dr. Alfred Levins aware of pt's BP 159/110 at d/c. Pt cleared for d/c with instructions to follow up closely for BP.

## 2017-12-30 NOTE — Discharge Instructions (Addendum)
Start taking hydralazine 3 times a day as recommended by your nephrologist.  Follow-up with him on your appointment on Wednesday.  Return to the emergency room if you develop sudden severe headache, facial droop, slurred speech, unilateral weakness or numbness, dizziness or if you pass out.

## 2017-12-30 NOTE — ED Notes (Signed)
Pt states he took tylenol around 1600 today already. EDP [place] notified.

## 2017-12-30 NOTE — ED Triage Notes (Signed)
States began headache this am at dialysis. States usually gets a headache about 3 hours into dialysis. States today also noted blood pressure high. States did have a 4 hour dialysis run today.

## 2018-01-20 ENCOUNTER — Ambulatory Visit (INDEPENDENT_AMBULATORY_CARE_PROVIDER_SITE_OTHER): Payer: BLUE CROSS/BLUE SHIELD

## 2018-01-20 ENCOUNTER — Other Ambulatory Visit (INDEPENDENT_AMBULATORY_CARE_PROVIDER_SITE_OTHER): Payer: Self-pay | Admitting: Vascular Surgery

## 2018-01-20 ENCOUNTER — Ambulatory Visit (INDEPENDENT_AMBULATORY_CARE_PROVIDER_SITE_OTHER): Payer: BLUE CROSS/BLUE SHIELD | Admitting: Vascular Surgery

## 2018-01-20 ENCOUNTER — Encounter (INDEPENDENT_AMBULATORY_CARE_PROVIDER_SITE_OTHER): Payer: Self-pay | Admitting: Vascular Surgery

## 2018-01-20 VITALS — BP 137/85 | HR 62 | Resp 17 | Ht 72.0 in | Wt 187.0 lb

## 2018-01-20 DIAGNOSIS — I10 Essential (primary) hypertension: Secondary | ICD-10-CM

## 2018-01-20 DIAGNOSIS — N186 End stage renal disease: Secondary | ICD-10-CM | POA: Diagnosis not present

## 2018-01-20 DIAGNOSIS — N185 Chronic kidney disease, stage 5: Principal | ICD-10-CM

## 2018-01-20 DIAGNOSIS — N179 Acute kidney failure, unspecified: Secondary | ICD-10-CM

## 2018-01-20 DIAGNOSIS — N189 Chronic kidney disease, unspecified: Principal | ICD-10-CM

## 2018-01-20 DIAGNOSIS — Z992 Dependence on renal dialysis: Secondary | ICD-10-CM

## 2018-01-20 DIAGNOSIS — K219 Gastro-esophageal reflux disease without esophagitis: Secondary | ICD-10-CM

## 2018-01-22 ENCOUNTER — Encounter (INDEPENDENT_AMBULATORY_CARE_PROVIDER_SITE_OTHER): Payer: Self-pay | Admitting: Vascular Surgery

## 2018-01-22 DIAGNOSIS — N186 End stage renal disease: Secondary | ICD-10-CM | POA: Insufficient documentation

## 2018-01-22 DIAGNOSIS — Z992 Dependence on renal dialysis: Secondary | ICD-10-CM

## 2018-01-22 DIAGNOSIS — N179 Acute kidney failure, unspecified: Secondary | ICD-10-CM | POA: Insufficient documentation

## 2018-01-22 DIAGNOSIS — I7789 Other specified disorders of arteries and arterioles: Secondary | ICD-10-CM | POA: Insufficient documentation

## 2018-01-22 DIAGNOSIS — I10 Essential (primary) hypertension: Secondary | ICD-10-CM | POA: Insufficient documentation

## 2018-01-22 NOTE — Progress Notes (Signed)
MRN : [id number]  Jeffrey Holloway is a 32 y.o. (1986/03/15) male who presents with chief complaint of  Chief Complaint  Patient presents with  . Follow-up    [facility] 5wk vein mapping  .  History of Present Illness:  The patient is seen for evaluation of dialysis access.  The patient recently started dialysis.  Current access is via a catheter which is functioning poorly.  There have not been any episodes of catheter infection.  The patient denies amaurosis fugax or recent TIA symptoms. There are no recent neurological changes noted. The patient denies claudication symptoms or rest pain symptoms. The patient denies history of DVT, PE or superficial thrombophlebitis. The patient denies recent episodes of angina or shortness of breath.   Vein mapping shows that he is a candidate for a wave link procedure in the right arm but not in the left.  Current Meds  Medication Sig  . amLODipine (NORVASC) 10 MG tablet Take 1 tablet (10 mg total) by mouth daily.  . calcium acetate (PHOSLO) 667 MG capsule Take 2 capsules (1,334 mg total) by mouth 3 (three) times daily with meals.  . calcium carbonate (TUMS - DOSED IN MG ELEMENTAL CALCIUM) 500 MG chewable tablet Chew 1 tablet by mouth 2 (two) times daily.  . cefdinir (OMNICEF) 300 MG capsule Take 1 capsule (300 mg total) by mouth 2 (two) times daily.  . cloNIDine (CATAPRES) 0.1 MG tablet Take 1 tablet (0.1 mg total) by mouth 2 (two) times daily.  . hydrALAZINE (APRESOLINE) 25 MG tablet Take 1 tablet (25 mg total) by mouth 3 (three) times daily.  Marland Kitchen losartan (COZAAR) 100 MG tablet Take 1 tablet (100 mg total) by mouth daily.  . multivitamin (RENA-VIT) TABS tablet Take 1 tablet by mouth at bedtime.  . mupirocin ointment (BACTROBAN) 2 % Place 1 application into the nose 2 (two) times daily.  . Nutritional Supplements (FEEDING SUPPLEMENT, NEPRO CARB STEADY,) LIQD Take 237 mLs by mouth 2 (two) times daily between meals.  Marland Kitchen omeprazole (PRILOSEC) 20 MG  capsule Take 20 mg by mouth daily.  . predniSONE (DELTASONE) 20 MG tablet Take 1.5 tablets (30 mg total) by mouth daily with breakfast. (Patient taking differently: Take 20 mg by mouth daily with breakfast. )  . traMADol (ULTRAM) 50 MG tablet Take 1 tablet (50 mg total) by mouth every 6 (six) hours as needed for moderate pain.    Past Medical History:  Diagnosis Date  . Constipation   . Diarrhea   . Glomerulonephritis   . Hemorrhoids   . Kidney infection   . Nausea and vomiting   . Occult blood in stools   . Shortness of breath   . Sleep apnea     Past Surgical History:  Procedure Laterality Date  . APPENDECTOMY    . DIALYSIS/PERMA CATHETER INSERTION N/A 10/15/2017   Procedure: DIALYSIS/PERMA CATHETER INSERTION;  Surgeon: Katha Cabal, MD;  Location: [place] [facility];  Service: Cardiovascular;  Laterality: N/A;    Social History Social History   Tobacco Use  . Smoking status: Current Every Day Smoker    Packs/day: 0.25    Types: Cigarettes  . Smokeless tobacco: Never Used  Substance Use Topics  . Alcohol use: Yes  . Drug use: Yes    Types: Marijuana, Cocaine    Family History Family History  Problem Relation Age of Onset  . Varicose Veins Neg Hx   . Vision loss Neg Hx   . Stroke Neg Hx  No Known Allergies   REVIEW OF SYSTEMS (Negative unless checked)  Constitutional: '[]'$ Weight loss  '[]'$ Fever  '[]'$ Chills Cardiac: '[]'$ Chest pain   '[]'$ Chest pressure   '[]'$ Palpitations   '[]'$ Shortness of breath when laying flat   '[]'$ Shortness of breath with exertion. Vascular:  '[]'$ Pain in legs with walking   '[]'$ Pain in legs at rest  '[]'$ History of DVT   '[]'$ Phlebitis   '[]'$ Swelling in legs   '[]'$ Varicose veins   '[]'$ Non-healing ulcers Pulmonary:   '[]'$ Uses home oxygen   '[]'$ Productive cough   '[]'$ Hemoptysis   '[]'$ Wheeze  '[]'$ COPD   '[]'$ Asthma Neurologic:  '[]'$ Dizziness   '[]'$ Seizures   '[]'$ History of stroke   '[]'$ History of TIA  '[]'$ Aphasia   '[]'$ Vissual changes   '[]'$ Weakness or numbness in arm   '[]'$ Weakness or  numbness in leg Musculoskeletal:   '[]'$ Joint swelling   '[]'$ Joint pain   '[]'$ Low back pain Hematologic:  '[]'$ Easy bruising  '[]'$ Easy bleeding   '[]'$ Hypercoagulable state   '[]'$ Anemic Gastrointestinal:  '[]'$ Diarrhea   '[]'$ Vomiting  '[]'$ Gastroesophageal reflux/heartburn   '[]'$ Difficulty swallowing. Genitourinary:  '[x]'$ Chronic kidney disease   '[]'$ Difficult urination  '[]'$ Frequent urination   '[]'$ Blood in urine Skin:  '[]'$ Rashes   '[]'$ Ulcers  Psychological:  '[]'$ History of anxiety   '[]'$  History of major depression.  Physical Examination  Vitals:   01/20/18 1458  BP: 137/85  Pulse: 62  Resp: 17  Weight: 187 lb (84.8 kg)  Height: 6' (1.829 m)   Body mass index is 25.36 kg/m. Gen: WD/WN, NAD Head: [place]/AT, No temporalis wasting.  Ear/Nose/Throat: Hearing grossly intact, nares w/o erythema or drainage Eyes: PER, EOMI, sclera nonicteric.  Neck: Supple, no large masses.   Pulmonary:  Good air movement, no audible wheezing bilaterally, no use of accessory muscles.  Cardiac: RRR, no JVD Vascular: Right IJ catheter clean dry and intact Vessel Right Left  Radial Palpable Palpable  Ulnar Palpable Palpable  Brachial Palpable Palpable  Gastrointestinal: Non-distended. No guarding/no peritoneal signs.  Musculoskeletal: M/S 5/5 throughout.  No deformity or atrophy.  Neurologic: CN 2-12 intact. Symmetrical.  Speech is fluent. Motor exam as listed above. Psychiatric: Judgment intact, Mood & affect appropriate for pt's clinical situation. Dermatologic: No rashes or ulcers noted.  No changes consistent with cellulitis. Lymph : No lichenification or skin changes of chronic lymphedema.  CBC Lab Results  Component Value Date   WBC 6.8 12/30/2017   HGB 8.8 (L) 12/30/2017   HCT 25.4 (L) 12/30/2017   MCV 92.1 12/30/2017   PLT 138 (L) 12/30/2017    BMET    Component Value Date/Time   NA 141 12/30/2017 1636   NA 134 (L) 01/17/2012 0126   K 3.9 12/30/2017 1636   K 3.7 01/17/2012 0126   CL 97 (L) 12/30/2017 1636   CL 100  01/17/2012 0126   CO2 31 12/30/2017 1636   CO2 27 01/17/2012 0126   GLUCOSE 109 (H) 12/30/2017 1636   GLUCOSE 98 01/17/2012 0126   BUN 27 (H) 12/30/2017 1636   BUN 7 01/17/2012 0126   CREATININE 5.04 (H) 12/30/2017 1636   CREATININE 0.94 01/17/2012 0126   CALCIUM 8.1 (L) 12/30/2017 1636   CALCIUM 7.1 (L) 10/10/2017 1400   GFRNONAA 14 (L) 12/30/2017 1636   GFRNONAA >60 01/17/2012 0126   GFRAA 16 (L) 12/30/2017 1636   GFRAA >60 01/17/2012 0126   CrCl cannot be calculated (Patient's most recent lab result is older than the maximum 21 days allowed.).  COAG Lab Results  Component Value Date   INR 0.95 10/14/2017   INR 0.93  10/14/2017    Radiology No results found.   Assessment/Plan 1. End stage renal disease ([place]) Recommend:  At this time the patient does not have appropriate extremity access for dialysis.  Patient should have a right arm wavlinQ endovascular fistula created at his convenience.  The risks, benefits and alternative therapies were reviewed in detail with the patient.  All questions were answered.  The patient will consider this.  He is actually interested in PD catheter placement and initiating peritoneal dialysis.  He will follow-up with me in 1 month after he is met with the surgeon at [facility] that does PD catheters   2. Essential hypertension Continue antihypertensive medications as already ordered, these medications have been reviewed and there are no changes at this time.   3. Gastroesophageal reflux disease, esophagitis presence not specified Continue PPI as already ordered, this medication has been reviewed and there are no changes at this time.  Avoidence of caffeine and alcohol  Moderate elevation of the head of the bed     Hortencia Pilar, MD  01/22/2018 11:35 AM

## 2018-02-05 ENCOUNTER — Emergency Department: Payer: BLUE CROSS/BLUE SHIELD

## 2018-02-05 ENCOUNTER — Emergency Department
Admission: EM | Admit: 2018-02-05 | Discharge: 2018-02-05 | Disposition: A | Discharge status: Home / Self Care | Payer: BLUE CROSS/BLUE SHIELD | Attending: Student in an Organized Health Care Education/Training Program | Admitting: Student in an Organized Health Care Education/Training Program

## 2018-02-05 ENCOUNTER — Other Ambulatory Visit: Payer: Self-pay

## 2018-02-05 DIAGNOSIS — R112 Nausea with vomiting, unspecified: Secondary | ICD-10-CM | POA: Insufficient documentation

## 2018-02-05 DIAGNOSIS — N186 End stage renal disease: Secondary | ICD-10-CM | POA: Diagnosis not present

## 2018-02-05 DIAGNOSIS — G4489 Other headache syndrome: Secondary | ICD-10-CM | POA: Insufficient documentation

## 2018-02-05 DIAGNOSIS — R197 Diarrhea, unspecified: Secondary | ICD-10-CM | POA: Diagnosis not present

## 2018-02-05 DIAGNOSIS — Z992 Dependence on renal dialysis: Secondary | ICD-10-CM | POA: Insufficient documentation

## 2018-02-05 DIAGNOSIS — R748 Abnormal levels of other serum enzymes: Secondary | ICD-10-CM

## 2018-02-05 DIAGNOSIS — F17210 Nicotine dependence, cigarettes, uncomplicated: Secondary | ICD-10-CM | POA: Diagnosis not present

## 2018-02-05 DIAGNOSIS — I120 Hypertensive chronic kidney disease with stage 5 chronic kidney disease or end stage renal disease: Secondary | ICD-10-CM | POA: Diagnosis not present

## 2018-02-05 DIAGNOSIS — Z79899 Other long term (current) drug therapy: Secondary | ICD-10-CM | POA: Insufficient documentation

## 2018-02-05 DIAGNOSIS — Y841 Kidney dialysis as the cause of abnormal reaction of the patient, or of later complication, without mention of misadventure at the time of the procedure: Secondary | ICD-10-CM | POA: Insufficient documentation

## 2018-02-05 LAB — CBC WITH DIFFERENTIAL/PLATELET
BAND NEUTROPHILS: 0 %
BASOS PCT: 0 %
BLASTS: 0 %
Basophils Absolute: 0 10*3/uL (ref 0–0.1)
EOS ABS: 0 10*3/uL (ref 0–0.7)
EOS PCT: 0 %
HEMATOCRIT: 29.9 % — AB (ref 40.0–52.0)
HEMOGLOBIN: 10.3 g/dL — AB (ref 13.0–18.0)
LYMPHS ABS: 1.4 10*3/uL (ref 1.0–3.6)
LYMPHS PCT: 14 %
MCH: 31.8 pg (ref 26.0–34.0)
MCHC: 34.4 g/dL (ref 32.0–36.0)
MCV: 92.5 fL (ref 80.0–100.0)
METAMYELOCYTES PCT: 0 %
MONOS PCT: 4 %
Monocytes Absolute: 0.4 10*3/uL (ref 0.2–1.0)
Myelocytes: 0 %
NEUTROS ABS: 8.3 10*3/uL — AB (ref 1.4–6.5)
Neutrophils Relative %: 82 %
OTHER: 0 %
Platelets: 166 10*3/uL (ref 150–440)
Promyelocytes Relative: 0 %
RBC: 3.23 MIL/uL — ABNORMAL LOW (ref 4.40–5.90)
RDW: 15.6 % — AB (ref 11.5–14.5)
WBC: 10.1 10*3/uL (ref 3.8–10.6)
nRBC: 0 /100 WBC

## 2018-02-05 LAB — COMPREHENSIVE METABOLIC PANEL
ALK PHOS: 52 U/L (ref 38–126)
ALT: 7 U/L (ref 0–44)
ANION GAP: 15 (ref 5–15)
AST: 23 U/L (ref 15–41)
Albumin: 3.8 g/dL (ref 3.5–5.0)
BUN: 55 mg/dL — ABNORMAL HIGH (ref 6–20)
CO2: 26 mmol/L (ref 22–32)
Calcium: 8.1 mg/dL — ABNORMAL LOW (ref 8.9–10.3)
Chloride: 102 mmol/L (ref 98–111)
Creatinine, Ser: 10.09 mg/dL — ABNORMAL HIGH (ref 0.61–1.24)
GFR calc non Af Amer: 6 mL/min — ABNORMAL LOW (ref >60)
GFR, EST AFRICAN AMERICAN: 7 mL/min — AB (ref >60)
Glucose, Bld: 80 mg/dL (ref 70–99)
POTASSIUM: 4.5 mmol/L (ref 3.5–5.1)
SODIUM: 143 mmol/L (ref 135–145)
Total Bilirubin: 0.7 mg/dL (ref 0.3–1.2)
Total Protein: 6.6 g/dL (ref 6.5–8.1)

## 2018-02-05 LAB — LIPASE, BLOOD: LIPASE: 77 U/L — AB (ref 11–51)

## 2018-02-05 LAB — TROPONIN I: TROPONIN I: 0.03 ng/mL — AB (ref <0.03)

## 2018-02-05 MED ORDER — MAGNESIUM SULFATE 2 GM/50ML IV SOLN
2.0000 g | Freq: Once | INTRAVENOUS | Status: AC
  Administered 2018-02-05: 2 g via INTRAVENOUS
  Filled 2018-02-05: qty 50

## 2018-02-05 MED ORDER — HYDRALAZINE HCL 50 MG PO TABS
25.0000 mg | ORAL_TABLET | Freq: Once | ORAL | Status: DC
  Filled 2018-02-05: qty 1

## 2018-02-05 MED ORDER — DIPHENHYDRAMINE HCL 50 MG/ML IJ SOLN
12.5000 mg | Freq: Once | INTRAMUSCULAR | Status: AC
  Administered 2018-02-05: 12.5 mg via INTRAVENOUS
  Filled 2018-02-05: qty 1

## 2018-02-05 MED ORDER — CLONIDINE HCL 0.1 MG PO TABS
0.1000 mg | ORAL_TABLET | Freq: Once | ORAL | Status: AC
  Administered 2018-02-05: 0.1 mg via ORAL
  Filled 2018-02-05: qty 1

## 2018-02-05 MED ORDER — LABETALOL HCL 5 MG/ML IV SOLN: 10.0000 mg | Freq: Once | INTRAVENOUS | Status: DC

## 2018-02-05 MED ORDER — SODIUM CHLORIDE 0.9 % IV BOLUS
500.0000 mL | Freq: Once | INTRAVENOUS | Status: AC
  Administered 2018-02-05: 500 mL via INTRAVENOUS

## 2018-02-05 MED ORDER — AMLODIPINE BESYLATE 5 MG PO TABS
10.0000 mg | ORAL_TABLET | Freq: Once | ORAL | Status: AC
  Administered 2018-02-05: 10 mg via ORAL
  Filled 2018-02-05: qty 2

## 2018-02-05 MED ORDER — ONDANSETRON 4 MG PO TBDP
ORAL_TABLET | ORAL | Status: AC
  Filled 2018-02-05: qty 1

## 2018-02-05 MED ORDER — ONDANSETRON 4 MG PO TBDP: 4.0000 mg | ORAL_TABLET | Freq: Once | ORAL | Status: DC

## 2018-02-05 MED ORDER — HYDRALAZINE HCL 50 MG PO TABS
50.0000 mg | ORAL_TABLET | Freq: Once | ORAL | Status: AC
  Administered 2018-02-05: 50 mg via ORAL
  Filled 2018-02-05: qty 1

## 2018-02-05 MED ORDER — PROCHLORPERAZINE EDISYLATE 10 MG/2ML IJ SOLN
10.0000 mg | Freq: Once | INTRAMUSCULAR | Status: AC
  Administered 2018-02-05: 10 mg via INTRAVENOUS
  Filled 2018-02-05: qty 2

## 2018-02-05 MED ORDER — CLONIDINE HCL 0.1 MG PO TABS
0.2000 mg | ORAL_TABLET | Freq: Once | ORAL | Status: AC
  Administered 2018-02-05: 0.2 mg via ORAL
  Filled 2018-02-05: qty 2

## 2018-02-05 MED ORDER — LOSARTAN POTASSIUM 50 MG PO TABS
100.0000 mg | ORAL_TABLET | Freq: Once | ORAL | Status: AC
  Administered 2018-02-05: 100 mg via ORAL
  Filled 2018-02-05: qty 2

## 2018-02-05 MED ORDER — ONDANSETRON HCL 4 MG/2ML IJ SOLN
4.0000 mg | Freq: Once | INTRAMUSCULAR | Status: AC
  Administered 2018-02-05: 4 mg via INTRAVENOUS
  Filled 2018-02-05: qty 2

## 2018-02-05 MED ORDER — HYDRALAZINE HCL 50 MG PO TABS
50.0000 mg | ORAL_TABLET | Freq: Once | ORAL | Status: AC
  Administered 2018-02-05: 50 mg via ORAL

## 2018-02-05 MED ORDER — MORPHINE SULFATE (PF) 4 MG/ML IV SOLN
4.0000 mg | Freq: Once | INTRAVENOUS | Status: AC
  Administered 2018-02-05: 4 mg via INTRAVENOUS
  Filled 2018-02-05: qty 1

## 2018-02-05 MED ORDER — ACETAMINOPHEN 500 MG PO TABS
1000.0000 mg | ORAL_TABLET | Freq: Once | ORAL | Status: AC
  Administered 2018-02-05: 1000 mg via ORAL
  Filled 2018-02-05: qty 2

## 2018-02-05 MED ORDER — ONDANSETRON 4 MG PO TBDP: 4.0000 mg | ORAL_TABLET | Freq: Three times a day (TID) | ORAL | 0 refills | Status: DC | PRN

## 2018-02-05 MED ORDER — DEXAMETHASONE SODIUM PHOSPHATE 10 MG/ML IJ SOLN
10.0000 mg | Freq: Once | INTRAMUSCULAR | Status: AC
  Administered 2018-02-05: 10 mg via INTRAVENOUS
  Filled 2018-02-05: qty 1

## 2018-02-05 NOTE — ED Notes (Signed)
Patient woken up and asked what his pain level is, patient describes that it at a 7.  Patient given pain medication and immediately returned to sleeping.  MD updated.

## 2018-02-05 NOTE — ED Provider Notes (Signed)
Patient received in sign-out from Dr. Alfred Levins.  Workup and evaluation pending reassessment of the patient developed nausea vomiting prior to discharge.  Was complaining of headache.  Has had intermittent headaches since May.  CT imaging ordered to evaluate for bleed or intracranial abnormality given his hypertension shows no acute on normality.  His neuro exam is nonfocal.  Discussed case with Dr. Zollie Scale of nephrology.  He was primarily concerned for possible SBO or gastroparesis based on his nausea and vomiting.  CT imaging ordered to evaluate for obstructive pattern shows none.  This is not clinically consistent with SBP.  His abdominal exam is otherwise soft and benign.  After additional observation in the ER the patient was tolerating oral hydration.  Intimately complaining of shortness of breath but does not have any cough hemoptysis or edema.  Doubt PE.  Unlikely infectious process.  Discussed observation admission the hospital with patient.  States he feels comfortable going home.  Will trial his oral home antihypertensive medications if able to tolerate this patient will be discharged home.      Merlyn Lot, MD 02/05/18 [phone]

## 2018-02-05 NOTE — ED Notes (Signed)
Pt now vomiting again; c/o blood in stool. MD aware. Pt no longer discharged at this time.

## 2018-02-05 NOTE — ED Provider Notes (Addendum)
[facility] Emergency Department Provider Note  ____________________________________________  Time seen: Approximately 12:00 PM  I have reviewed the triage vital signs and the nursing notes.   HISTORY  Chief Complaint Emesis   HPI Jeffrey Holloway is a 32 y.o. 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  Patient reports that he woke up this morning feeling sick.  He has had greater than 20 episodes of nonbloody nonbilious emesis.  He also complaining of having 20 bowel movements.  His stool was formed and not watery.  No melena.  Patient reports a mild headache this morning which has become severe as he continued to vomit.  The headache is diffuse, sharp, throbbing, radiating to occipital region and associated with photophobia.  Patient reports that since being started on dialysis he has had similar severe headaches on a regular basis. This HA is identical to prior ones.  He reports that oxycodone usually helps for the headaches.  He denies fever or chills, abdominal pain, neck stiffness.  Patient is also complaining of burning sensation in the center of his chest which started after several episodes of vomiting.  Past Medical History:  Diagnosis Date  . Constipation   . Diarrhea   . Glomerulonephritis   . Hemorrhoids   . Kidney infection   . Nausea and vomiting   . Occult blood in stools   . Shortness of breath   . Sleep apnea     Patient Active Problem List   Diagnosis Date Noted  . End stage renal disease ([place]) 01/22/2018  . Essential hypertension 01/22/2018  . Cellulitis 11/25/2017  . 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 GERD (gastroesophageal reflux disease) 10/10/2017  . Rectal foreign body     Past Surgical History:  Procedure Laterality Date  . APPENDECTOMY    . DIALYSIS/PERMA CATHETER INSERTION N/A  10/15/2017   Procedure: DIALYSIS/PERMA CATHETER INSERTION;  Surgeon: Katha Cabal, MD;  Location: [facility];  Service: Cardiovascular;  Laterality: N/A;    Prior to Admission medications   Medication Sig Start Date End Date Taking? Authorizing Provider  amLODipine (NORVASC) 10 MG tablet Take 1 tablet (10 mg total) by mouth daily. 10/18/17   Epifanio Lesches, MD  calcium acetate (PHOSLO) 667 MG capsule Take 2 capsules (1,334 mg total) by mouth 3 (three) times daily with meals. 10/18/17   Epifanio Lesches, MD  calcium carbonate (TUMS - DOSED IN MG ELEMENTAL CALCIUM) 500 MG chewable tablet Chew 1 tablet by mouth 2 (two) times daily.    [provider]  cefdinir (OMNICEF) 300 MG capsule Take 1 capsule (300 mg total) by mouth 2 (two) times daily. 11/27/17   Salary, Avel Peace, MD  cloNIDine (CATAPRES) 0.1 MG tablet Take 1 tablet (0.1 mg total) by mouth 2 (two) times daily. 10/18/17   Epifanio Lesches, MD  hydrALAZINE (APRESOLINE) 25 MG tablet Take 1 tablet (25 mg total) by mouth 3 (three) times daily. 12/30/17 12/30/18  Rudene Re, MD  losartan (COZAAR) 100 MG tablet Take 1 tablet (100 mg total) by mouth daily. 10/18/17   Epifanio Lesches, MD  multivitamin (RENA-VIT) TABS tablet Take 1 tablet by mouth at bedtime. 10/18/17   Epifanio Lesches, MD  mupirocin ointment (BACTROBAN) 2 % Place 1 application into the nose 2 (two) times daily. 11/27/17   Salary, Avel Peace, MD  Nutritional Supplements (FEEDING SUPPLEMENT, NEPRO CARB STEADY,) LIQD Take 237 mLs by  mouth 2 (two) times daily between meals. 10/18/17   Epifanio Lesches, MD  omeprazole (PRILOSEC) 20 MG capsule Take 20 mg by mouth daily.    [provider]  ondansetron (ZOFRAN ODT) 4 MG disintegrating tablet Take 1 tablet (4 mg total) by mouth every 8 (eight) hours as needed for nausea or vomiting. 02/05/18   Rudene Re, MD  oxyCODONE (OXY IR/ROXICODONE) 5 MG immediate release tablet Take 1-2  tablets (5-10 mg total) by mouth every 6 (six) hours as needed for moderate pain. Patient not taking: Reported on 11/25/2017 10/18/17   Epifanio Lesches, MD  predniSONE (DELTASONE) 20 MG tablet Take 1.5 tablets (30 mg total) by mouth daily with breakfast. Patient taking differently: Take 20 mg by mouth daily with breakfast.  10/19/17   Epifanio Lesches, MD  traMADol (ULTRAM) 50 MG tablet Take 1 tablet (50 mg total) by mouth every 6 (six) hours as needed for moderate pain. 11/27/17   Salary, Avel Peace, MD    Allergies Patient has no known allergies.  Family History  Problem Relation Age of Onset  . Varicose Veins Neg Hx   . Vision loss Neg Hx   . Stroke Neg Hx     Social History Social History   Tobacco Use  . Smoking status: Current Every Day Smoker    Packs/day: 0.25    Types: Cigarettes  . Smokeless tobacco: Never Used  Substance Use Topics  . Alcohol use: Yes  . Drug use: Yes    Types: Marijuana, Cocaine    Review of Systems  Constitutional: Negative for fever. Eyes: Negative for visual changes. ENT: Negative for sore throat. Neck: No neck pain  Cardiovascular: + chest pain. Respiratory: Negative for shortness of breath. Gastrointestinal: Negative for abdominal pain. + nausea, vomiting, frequent BMs. Genitourinary: Negative for dysuria. Musculoskeletal: Negative for back pain. Skin: Negative for rash. Neurological: Negative for weakness or numbness. + HA Psych: No SI or HI  ____________________________________________   PHYSICAL EXAM:  VITAL SIGNS: ED Triage Vitals  Enc Vitals Group     BP 02/05/18 1048 (!) 190/111     Pulse Rate 02/05/18 1048 74     Resp 02/05/18 1048 20     Temp 02/05/18 1048 98.4 F (36.9 C)     Temp Source 02/05/18 1048 Oral     SpO2 02/05/18 1048 99 %     Weight 02/05/18 1049 187 lb 6.3 oz (85 kg)     Height 02/05/18 1049 6' (1.829 m)     Head Circumference --      Peak Flow --      Pain Score 02/05/18 1049 10     Pain Loc  --      Pain Edu? --      Excl. in [place]? --     Constitutional: Alert and oriented. Well appearing and in no apparent distress. HEENT:      Head: Normocephalic and atraumatic.         Eyes: Conjunctivae are normal. Sclera is non-icteric.       Mouth/Throat: Mucous membranes are moist.       Neck: Supple with no signs of meningismus. Cardiovascular: Regular rate and rhythm. No murmurs, gallops, or rubs. 2+ symmetrical distal pulses are present in all extremities. No JVD. Respiratory: Normal respiratory effort. Lungs are clear to auscultation bilaterally. No wheezes, crackles, or rhonchi.  Gastrointestinal: Soft, non tender, and non distended with positive bowel sounds. No rebound or guarding. Musculoskeletal: Nontender with normal range of motion in  all extremities. No edema, cyanosis, or erythema of extremities. Neurologic: Normal speech and language. A & O x3, PERRL, EOMI, no nystagmus, CN II-XII intact, motor testing reveals good tone and bulk throughout. There is no evidence of pronator drift or dysmetria. Muscle strength is 5/5 throughout. Sensory examination is intact. Gait is normal. Skin: Skin is warm, dry and intact. No rash noted. Psychiatric: Mood and affect are normal. Speech and behavior are normal.  ____________________________________________   LABS (all labs ordered are listed, but only abnormal results are displayed)  Labs Reviewed  CBC WITH DIFFERENTIAL/PLATELET - Abnormal; Notable for the following components:      Result Value   RBC 3.23 (*)    Hemoglobin 10.3 (*)    HCT 29.9 (*)    RDW 15.6 (*)    Neutro Abs 8.3 (*)    All other components within normal limits  COMPREHENSIVE METABOLIC PANEL - Abnormal; Notable for the following components:   BUN 55 (*)    Creatinine, Ser 10.09 (*)    Calcium 8.1 (*)    GFR calc non Af Amer 6 (*)    GFR calc Af Amer 7 (*)    All other components within normal limits  TROPONIN I - Abnormal; Notable for the following components:    Troponin I 0.03 (*)    All other components within normal limits  LIPASE, BLOOD - Abnormal; Notable for the following components:   Lipase 77 (*)    All other components within normal limits   ____________________________________________  EKG  ED ECG REPORT I, Rudene Re, the attending physician, personally viewed and interpreted this ECG.  Normal sinus rhythm, rate of 74, normal intervals, normal axis, no ST elevations or depressions.  Unchanged from prior. ____________________________________________  RADIOLOGY  I have personally reviewed the images performed during this visit and I agree with the Radiologist's read.   Interpretation by Radiologist:  Dg Chest 2 View  Result Date: 02/05/2018 CLINICAL DATA:  Vomiting and chills since last night at 3 a.m. EXAM: CHEST - 2 VIEW COMPARISON:  Single-view of the chest 10/20/2017. PA and lateral chest 10/09/2017. FINDINGS: Right IJ approach dialysis catheter is in place with its tip at the superior cavoatrial junction. There is mild vascular congestion. No consolidative process, pneumothorax or effusion. Heart size is normal. No acute or focal bony abnormality. IMPRESSION: No acute disease. Electronically Signed   By: Inge Rise M.D.   On: 02/05/2018 11:34     ____________________________________________   PROCEDURES  Procedure(s) performed: None Procedures Critical Care performed:  None ____________________________________________   INITIAL IMPRESSION / ASSESSMENT AND PLAN / ED COURSE  32 y.o. 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  Patient presents with more than 20 episodes of vomiting and bowel movements today.  Also complaining of severe headache which she has had regularly since being started on dialysis.  Patient is neurologically intact.  His vitals show elevated blood pressure in  the setting of not taking his medications this morning.  Abdomen is soft and nontender.  Also complaining of burning chest pain that started after several episodes of vomiting most likely due to esophageal irritation.  EKG shows no evidence of ischemia or dysrhythmias.  Labs showing normal white count, CMP really unchanged from patient's baseline.  Lipase slightly elevated.  Differential diagnoses including viral gastroenteritis versus gastritis versus pancreatitis.  Patient has normal T bili, normal LFTs, and no abdominal  pain or tenderness therefore low suspicion for gallstone pancreatitis. Will give home antihypertensive meds, IV fluids, Zofran, and morphine.  At this time I do not believe patient warrants CT of his head as this is a chronic HA he has had several times before, no thunderclap HA, neuro intact, no signs of meningitis.  Clinical Course as of Feb 06 1456  Wed Feb 05, 2018  1448 IV removed and patient being prepped for discharge when he started again to vomit. Will replace IV and give 500cc of IVF and IV zofran and reassess. Will add RUQ [place] to rule out gallstone pancreatitis. Patient may need admission if symptoms not improved. Care transferred to Dr. Quentin Cornwall.    [CV]    Clinical Course User Index [CV] Alfred Levins [place], MD   _________________________ 2:17 PM on 02/05/2018 -----------------------------------------  Patient remains neurologically intact.  Headache has improved significantly.  Blood pressures trending down after p.o. meds.  No longer having any episodes of vomiting or diarrhea.  Discuss with Dr. Zollie Scale as patient was concerned that he is peritoneal catheter had not been flushed since he was placed a week ago.  Dr. Zollie Scale will arrange for it to be flushed tomorrow at dialysis. Patient tolerating PO. Will dc to the care of his wife. Discussed return precautions for thunderclap headache, neurological deficits, abdominal pain, fever, or recurrence of vomiting and diarrhea.   Otherwise he will follow-up with his nephrologist tomorrow.   As part of my medical decision making, I reviewed the following data within the [place] notes reviewed and incorporated, Labs reviewed , EKG interpreted , Old EKG reviewed, Old chart reviewed, Radiograph reviewed , A consult was requested and obtained from this/these consultant(s) Nephrology, Notes from prior ED visits and [place] Controlled Substance Database    Pertinent labs & imaging results that were available during my care of the patient were reviewed by me and considered in my medical decision making (see chart for details).    ____________________________________________   FINAL CLINICAL IMPRESSION(S) / ED DIAGNOSES  Final diagnoses:  Nausea vomiting and diarrhea  Gastroenteritis  Dialysis headache  Elevated lipase      NEW MEDICATIONS STARTED DURING THIS VISIT:  ED Discharge Orders         Ordered    ondansetron (ZOFRAN ODT) 4 MG disintegrating tablet  Every 8 hours PRN     02/05/18 1417           Note:  This document was prepared using Dragon voice recognition software and may include unintentional dictation errors.    Alfred Levins, [place], MD 02/05/18 [place], [place], MD 02/05/18 [phone]

## 2018-02-05 NOTE — Discharge Instructions (Signed)

## 2018-02-05 NOTE — ED Notes (Signed)
Patient transported to CT 

## 2018-02-05 NOTE — ED Triage Notes (Signed)
Pt arrives via [facility] from home with complaints of emesis (30+) and chills today since approx 0300. Pt states he has had approx the same number of bowel movements but they have been formed. Pt is an ESRD patient. Tuesday/Thursday/Saturday dialysis patient. Last treatment was yesterday. Pt had PD catheter placed x 1 week ago at [facility]. Has HD catheter to right chest.

## 2018-02-05 NOTE — ED Notes (Signed)
Per pt, takes 50mg  hydralazine at home. MD aware. See new orders.

## 2018-02-05 NOTE — ED Notes (Signed)
Per Coralyn Mark in lab, pt's platelets being run at this time.

## 2018-02-05 NOTE — ED Notes (Signed)
Patient transported to XR. 

## 2018-02-05 NOTE — ED Notes (Signed)
This RN attempted IV access x2. Unsuccessful.

## 2018-03-07 ENCOUNTER — Encounter: Admit: 2018-03-07 | Discharge: 2018-03-08 | Payer: PRIVATE HEALTH INSURANCE

## 2018-03-07 ENCOUNTER — Encounter
Admit: 2018-03-07 | Discharge: 2018-03-08 | Payer: PRIVATE HEALTH INSURANCE | Attending: Nephrology | Primary: Nephrology

## 2018-03-07 ENCOUNTER — Ambulatory Visit
Admit: 2018-03-07 | Discharge: 2018-03-08 | Payer: PRIVATE HEALTH INSURANCE | Attending: Nephrology | Primary: Nephrology

## 2018-03-07 ENCOUNTER — Institutional Professional Consult (permissible substitution): Admit: 2018-03-07 | Discharge: 2018-03-08 | Payer: PRIVATE HEALTH INSURANCE

## 2018-03-07 DIAGNOSIS — Z01818 Encounter for other preprocedural examination: Principal | ICD-10-CM

## 2018-03-07 DIAGNOSIS — Z6823 Body mass index (BMI) 23.0-23.9, adult: Secondary | ICD-10-CM

## 2018-03-07 DIAGNOSIS — Z992 Dependence on renal dialysis: Secondary | ICD-10-CM

## 2018-03-07 DIAGNOSIS — N186 End stage renal disease: Secondary | ICD-10-CM

## 2018-03-10 ENCOUNTER — Encounter: Admit: 2018-03-10 | Discharge: 2018-03-11 | Payer: PRIVATE HEALTH INSURANCE | Attending: Surgery | Primary: Surgery

## 2018-03-10 ENCOUNTER — Encounter: Admit: 2018-03-10 | Discharge: 2018-03-11 | Payer: PRIVATE HEALTH INSURANCE

## 2018-03-10 ENCOUNTER — Ambulatory Visit: Admit: 2018-03-10 | Discharge: 2018-03-11 | Payer: PRIVATE HEALTH INSURANCE

## 2018-03-10 DIAGNOSIS — N186 End stage renal disease: Secondary | ICD-10-CM

## 2018-03-10 DIAGNOSIS — Z87891 Personal history of nicotine dependence: Secondary | ICD-10-CM

## 2018-03-10 DIAGNOSIS — Z01818 Encounter for other preprocedural examination: Principal | ICD-10-CM

## 2018-03-31 ENCOUNTER — Inpatient Hospital Stay
Admission: EM | Admit: 2018-03-31 | Discharge: 2018-04-04 | DRG: 304 | Disposition: A | Discharge status: Home / Self Care | Payer: BLUE CROSS/BLUE SHIELD | Attending: Internal Medicine | Admitting: Internal Medicine

## 2018-03-31 ENCOUNTER — Emergency Department: Payer: BLUE CROSS/BLUE SHIELD

## 2018-03-31 ENCOUNTER — Other Ambulatory Visit: Payer: Self-pay

## 2018-03-31 DIAGNOSIS — I776 Arteritis, unspecified: Secondary | ICD-10-CM | POA: Diagnosis present

## 2018-03-31 DIAGNOSIS — I160 Hypertensive urgency: Principal | ICD-10-CM | POA: Diagnosis present

## 2018-03-31 DIAGNOSIS — F418 Other specified anxiety disorders: Secondary | ICD-10-CM | POA: Diagnosis present

## 2018-03-31 DIAGNOSIS — R519 Headache, unspecified: Secondary | ICD-10-CM

## 2018-03-31 DIAGNOSIS — N186 End stage renal disease: Secondary | ICD-10-CM | POA: Diagnosis present

## 2018-03-31 DIAGNOSIS — G2581 Restless legs syndrome: Secondary | ICD-10-CM | POA: Diagnosis present

## 2018-03-31 DIAGNOSIS — I6783 Posterior reversible encephalopathy syndrome: Secondary | ICD-10-CM | POA: Diagnosis not present

## 2018-03-31 DIAGNOSIS — D696 Thrombocytopenia, unspecified: Secondary | ICD-10-CM | POA: Diagnosis present

## 2018-03-31 DIAGNOSIS — R51 Headache: Secondary | ICD-10-CM | POA: Diagnosis present

## 2018-03-31 DIAGNOSIS — G4730 Sleep apnea, unspecified: Secondary | ICD-10-CM | POA: Diagnosis present

## 2018-03-31 DIAGNOSIS — I161 Hypertensive emergency: Secondary | ICD-10-CM

## 2018-03-31 DIAGNOSIS — D631 Anemia in chronic kidney disease: Secondary | ICD-10-CM | POA: Diagnosis present

## 2018-03-31 DIAGNOSIS — Z79899 Other long term (current) drug therapy: Secondary | ICD-10-CM

## 2018-03-31 DIAGNOSIS — I150 Renovascular hypertension: Secondary | ICD-10-CM

## 2018-03-31 DIAGNOSIS — K219 Gastro-esophageal reflux disease without esophagitis: Secondary | ICD-10-CM | POA: Diagnosis present

## 2018-03-31 DIAGNOSIS — F17210 Nicotine dependence, cigarettes, uncomplicated: Secondary | ICD-10-CM | POA: Diagnosis present

## 2018-03-31 DIAGNOSIS — Z992 Dependence on renal dialysis: Secondary | ICD-10-CM | POA: Diagnosis not present

## 2018-03-31 DIAGNOSIS — I120 Hypertensive chronic kidney disease with stage 5 chronic kidney disease or end stage renal disease: Secondary | ICD-10-CM | POA: Diagnosis present

## 2018-03-31 DIAGNOSIS — N2581 Secondary hyperparathyroidism of renal origin: Secondary | ICD-10-CM | POA: Diagnosis present

## 2018-03-31 LAB — MRSA PCR SCREENING: MRSA by PCR: NEGATIVE

## 2018-03-31 LAB — URINE DRUG SCREEN, QUALITATIVE (ARMC ONLY)
AMPHETAMINES, UR SCREEN: NOT DETECTED
Barbiturates, Ur Screen: NOT DETECTED
Benzodiazepine, Ur Scrn: NOT DETECTED
CANNABINOID 50 NG, UR ~~LOC~~: POSITIVE — AB
Cocaine Metabolite,Ur ~~LOC~~: NOT DETECTED
MDMA (ECSTASY) UR SCREEN: NOT DETECTED
Methadone Scn, Ur: NOT DETECTED
Opiate, Ur Screen: NOT DETECTED
Phencyclidine (PCP) Ur S: NOT DETECTED
Tricyclic, Ur Screen: NOT DETECTED

## 2018-03-31 LAB — CBC WITH DIFFERENTIAL/PLATELET
ABS IMMATURE GRANULOCYTES: 0.28 10*3/uL — AB (ref 0.00–0.07)
Basophils Absolute: 0 10*3/uL (ref 0.0–0.1)
Basophils Relative: 0 %
Eosinophils Absolute: 0 10*3/uL (ref 0.0–0.5)
Eosinophils Relative: 0 %
HEMATOCRIT: 29.4 % — AB (ref 39.0–52.0)
HEMOGLOBIN: 9 g/dL — AB (ref 13.0–17.0)
IMMATURE GRANULOCYTES: 4 %
LYMPHS ABS: 1.2 10*3/uL (ref 0.7–4.0)
LYMPHS PCT: 18 %
MCH: 30.7 pg (ref 26.0–34.0)
MCHC: 30.6 g/dL (ref 30.0–36.0)
MCV: 100.3 fL — AB (ref 80.0–100.0)
MONOS PCT: 10 %
Monocytes Absolute: 0.7 10*3/uL (ref 0.1–1.0)
NEUTROS PCT: 68 %
NRBC: 0.6 % — AB (ref 0.0–0.2)
Neutro Abs: 4.4 10*3/uL (ref 1.7–7.7)
Platelets: 105 10*3/uL — ABNORMAL LOW (ref 150–400)
RBC: 2.93 MIL/uL — ABNORMAL LOW (ref 4.22–5.81)
RDW: 17.5 % — ABNORMAL HIGH (ref 11.5–15.5)
WBC: 6.6 10*3/uL (ref 4.0–10.5)

## 2018-03-31 LAB — BASIC METABOLIC PANEL
ANION GAP: 10 (ref 5–15)
BUN: 41 mg/dL — ABNORMAL HIGH (ref 6–20)
CALCIUM: 8.2 mg/dL — AB (ref 8.9–10.3)
CHLORIDE: 105 mmol/L (ref 98–111)
CO2: 29 mmol/L (ref 22–32)
Creatinine, Ser: 8.55 mg/dL — ABNORMAL HIGH (ref 0.61–1.24)
GFR calc non Af Amer: 7 mL/min — ABNORMAL LOW (ref >60)
GFR, EST AFRICAN AMERICAN: 8 mL/min — AB (ref >60)
Glucose, Bld: 91 mg/dL (ref 70–99)
POTASSIUM: 3.7 mmol/L (ref 3.5–5.1)
Sodium: 144 mmol/L (ref 135–145)

## 2018-03-31 LAB — PROTEIN / CREATININE RATIO, URINE
Creatinine, Urine: 53 mg/dL
PROTEIN CREATININE RATIO: 16.53 mg/mg{creat} — AB (ref 0.00–0.15)
Total Protein, Urine: 876 mg/dL

## 2018-03-31 LAB — FOLATE: Folate: 9.8 ng/mL (ref >5.9)

## 2018-03-31 LAB — HEPATIC FUNCTION PANEL
ALK PHOS: 41 U/L (ref 38–126)
ALT: 16 U/L (ref 0–44)
AST: 24 U/L (ref 15–41)
Albumin: 3.9 g/dL (ref 3.5–5.0)
Total Bilirubin: 0.9 mg/dL (ref 0.3–1.2)
Total Protein: 6.7 g/dL (ref 6.5–8.1)

## 2018-03-31 LAB — MAGNESIUM: Magnesium: 1.9 mg/dL (ref 1.7–2.4)

## 2018-03-31 LAB — PHOSPHORUS: Phosphorus: 5.4 mg/dL — ABNORMAL HIGH (ref 2.5–4.6)

## 2018-03-31 LAB — VITAMIN B12: Vitamin B-12: 730 pg/mL (ref 180–914)

## 2018-03-31 LAB — GLUCOSE, CAPILLARY: GLUCOSE-CAPILLARY: 78 mg/dL (ref 70–99)

## 2018-03-31 LAB — PREALBUMIN: Prealbumin: 45.8 mg/dL — ABNORMAL HIGH (ref 18–38)

## 2018-03-31 MED ORDER — MORPHINE SULFATE (PF) 4 MG/ML IV SOLN
4.0000 mg | Freq: Once | INTRAVENOUS | Status: AC
  Administered 2018-03-31: 4 mg via INTRAVENOUS
  Filled 2018-03-31: qty 1

## 2018-03-31 MED ORDER — FENTANYL CITRATE (PF) 100 MCG/2ML IJ SOLN
25.0000 ug | Freq: Once | INTRAMUSCULAR | Status: AC
  Administered 2018-03-31: 25 ug via INTRAVENOUS
  Filled 2018-03-31: qty 2

## 2018-03-31 MED ORDER — LOSARTAN POTASSIUM 50 MG PO TABS
100.0000 mg | ORAL_TABLET | Freq: Every day | ORAL | Status: DC
  Administered 2018-03-31 – 2018-04-04 (×5): 100 mg via ORAL
  Filled 2018-03-31: qty 4
  Filled 2018-03-31: qty 2
  Filled 2018-03-31 (×3): qty 4

## 2018-03-31 MED ORDER — ALPRAZOLAM 0.25 MG PO TABS
0.2500 mg | ORAL_TABLET | Freq: Once | ORAL | Status: AC
  Administered 2018-03-31: 0.25 mg via ORAL
  Filled 2018-03-31: qty 1

## 2018-03-31 MED ORDER — ONDANSETRON HCL 4 MG PO TABS: 4.0000 mg | ORAL_TABLET | Freq: Four times a day (QID) | ORAL | Status: DC | PRN

## 2018-03-31 MED ORDER — CHLORHEXIDINE GLUCONATE CLOTH 2 % EX PADS: 6.0000 | MEDICATED_PAD | Freq: Every day | CUTANEOUS | Status: DC

## 2018-03-31 MED ORDER — CALCIUM CARBONATE ANTACID 500 MG PO CHEW: 1.0000 | CHEWABLE_TABLET | Freq: Two times a day (BID) | ORAL | Status: DC | PRN

## 2018-03-31 MED ORDER — ONDANSETRON HCL 4 MG/2ML IJ SOLN
4.0000 mg | Freq: Once | INTRAMUSCULAR | Status: AC
  Administered 2018-03-31: 4 mg via INTRAVENOUS
  Filled 2018-03-31: qty 2

## 2018-03-31 MED ORDER — FENTANYL CITRATE (PF) 100 MCG/2ML IJ SOLN
50.0000 ug | Freq: Once | INTRAMUSCULAR | Status: AC
  Administered 2018-03-31: 50 ug via INTRAVENOUS
  Filled 2018-03-31: qty 2

## 2018-03-31 MED ORDER — ONDANSETRON HCL 4 MG/2ML IJ SOLN
4.0000 mg | Freq: Four times a day (QID) | INTRAMUSCULAR | Status: DC | PRN
  Administered 2018-04-01: 4 mg via INTRAVENOUS
  Filled 2018-03-31 (×2): qty 2

## 2018-03-31 MED ORDER — CARVEDILOL 6.25 MG PO TABS
6.2500 mg | ORAL_TABLET | Freq: Two times a day (BID) | ORAL | Status: DC
  Administered 2018-03-31 – 2018-04-01 (×2): 6.25 mg via ORAL
  Filled 2018-03-31 (×2): qty 1

## 2018-03-31 MED ORDER — HYDROMORPHONE HCL 1 MG/ML IJ SOLN
1.0000 mg | Freq: Once | INTRAMUSCULAR | Status: AC
  Administered 2018-03-31: 1 mg via INTRAVENOUS
  Filled 2018-03-31: qty 1

## 2018-03-31 MED ORDER — NEPRO/CARBSTEADY PO LIQD
237.0000 mL | Freq: Two times a day (BID) | ORAL | Status: DC
  Administered 2018-04-02 – 2018-04-04 (×4): 237 mL via ORAL

## 2018-03-31 MED ORDER — PROCHLORPERAZINE EDISYLATE 10 MG/2ML IJ SOLN
10.0000 mg | Freq: Once | INTRAMUSCULAR | Status: AC
  Administered 2018-03-31: 10 mg via INTRAVENOUS
  Filled 2018-03-31: qty 2

## 2018-03-31 MED ORDER — BISACODYL 5 MG PO TBEC: 5.0000 mg | DELAYED_RELEASE_TABLET | Freq: Every day | ORAL | Status: DC | PRN

## 2018-03-31 MED ORDER — RENA-VITE PO TABS
1.0000 | ORAL_TABLET | Freq: Every day | ORAL | Status: DC
  Administered 2018-03-31 – 2018-04-03 (×4): 1 via ORAL
  Filled 2018-03-31 (×5): qty 1

## 2018-03-31 MED ORDER — FENTANYL CITRATE (PF) 100 MCG/2ML IJ SOLN
12.5000 ug | Freq: Once | INTRAMUSCULAR | Status: AC
  Administered 2018-03-31: 12.5 ug via INTRAVENOUS
  Filled 2018-03-31: qty 2

## 2018-03-31 MED ORDER — HYDRALAZINE HCL 50 MG PO TABS
50.0000 mg | ORAL_TABLET | Freq: Three times a day (TID) | ORAL | Status: DC
Start: 2018-03-31 — End: 2018-04-01
  Administered 2018-03-31 – 2018-04-01 (×3): 50 mg via ORAL
  Filled 2018-03-31 (×3): qty 1

## 2018-03-31 MED ORDER — FENTANYL CITRATE (PF) 100 MCG/2ML IJ SOLN
INTRAMUSCULAR | Status: AC
  Filled 2018-03-31: qty 2

## 2018-03-31 MED ORDER — OXYCODONE HCL 5 MG PO TABS
5.0000 mg | ORAL_TABLET | Freq: Once | ORAL | Status: AC
  Administered 2018-03-31: 5 mg via ORAL
  Filled 2018-03-31: qty 1

## 2018-03-31 MED ORDER — OXYCODONE-ACETAMINOPHEN 5-325 MG PO TABS
1.0000 | ORAL_TABLET | Freq: Four times a day (QID) | ORAL | Status: AC | PRN
  Administered 2018-03-31 – 2018-04-01 (×4): 1 via ORAL
  Filled 2018-03-31 (×4): qty 1

## 2018-03-31 MED ORDER — NICARDIPINE HCL IN NACL 20-0.86 MG/200ML-% IV SOLN
3.0000 mg/h | Freq: Once | INTRAVENOUS | Status: AC
  Administered 2018-03-31: 5 mg/h via INTRAVENOUS
  Filled 2018-03-31: qty 200

## 2018-03-31 MED ORDER — HEPARIN SODIUM (PORCINE) 5000 UNIT/ML IJ SOLN
5000.0000 [IU] | Freq: Three times a day (TID) | INTRAMUSCULAR | Status: DC
  Administered 2018-03-31 – 2018-04-04 (×12): 5000 [IU] via SUBCUTANEOUS
  Filled 2018-03-31 (×12): qty 1

## 2018-03-31 MED ORDER — TRAZODONE HCL 100 MG PO TABS
100.0000 mg | ORAL_TABLET | Freq: Every evening | ORAL | Status: DC | PRN
  Administered 2018-03-31 – 2018-04-02 (×2): 100 mg via ORAL
  Filled 2018-03-31 (×2): qty 1

## 2018-03-31 MED ORDER — SENNOSIDES-DOCUSATE SODIUM 8.6-50 MG PO TABS: 1.0000 | ORAL_TABLET | Freq: Every evening | ORAL | Status: DC | PRN

## 2018-03-31 MED ORDER — CALCIUM ACETATE (PHOS BINDER) 667 MG PO CAPS
1334.0000 mg | ORAL_CAPSULE | Freq: Three times a day (TID) | ORAL | Status: DC
  Administered 2018-03-31 – 2018-04-03 (×6): 1334 mg via ORAL
  Filled 2018-03-31 (×12): qty 2

## 2018-03-31 MED ORDER — PANTOPRAZOLE SODIUM 40 MG PO TBEC
40.0000 mg | DELAYED_RELEASE_TABLET | Freq: Every day | ORAL | Status: DC
  Administered 2018-03-31 – 2018-04-04 (×5): 40 mg via ORAL
  Filled 2018-03-31 (×5): qty 1

## 2018-03-31 MED ORDER — ACETAMINOPHEN 650 MG RE SUPP: 650.0000 mg | Freq: Four times a day (QID) | RECTAL | Status: DC | PRN

## 2018-03-31 MED ORDER — HYDRALAZINE HCL 20 MG/ML IJ SOLN
10.0000 mg | Freq: Once | INTRAMUSCULAR | Status: AC
  Administered 2018-03-31: 10 mg via INTRAVENOUS
  Filled 2018-03-31: qty 1

## 2018-03-31 MED ORDER — FENTANYL CITRATE (PF) 100 MCG/2ML IJ SOLN
25.0000 ug | INTRAMUSCULAR | Status: DC | PRN
  Administered 2018-03-31: 25 ug via INTRAVENOUS

## 2018-03-31 MED ORDER — ACETAMINOPHEN 325 MG PO TABS
650.0000 mg | ORAL_TABLET | Freq: Four times a day (QID) | ORAL | Status: DC | PRN
  Administered 2018-04-01 (×2): 650 mg via ORAL
  Filled 2018-03-31 (×2): qty 2

## 2018-03-31 MED ORDER — PREDNISONE 20 MG PO TABS
20.0000 mg | ORAL_TABLET | Freq: Every day | ORAL | Status: DC
  Administered 2018-03-31: 10 mg via ORAL
  Administered 2018-04-01 – 2018-04-04 (×4): 20 mg via ORAL
  Filled 2018-03-31: qty 2
  Filled 2018-03-31 (×4): qty 1

## 2018-03-31 MED ORDER — OXYCODONE-ACETAMINOPHEN 5-325 MG PO TABS
1.0000 | ORAL_TABLET | Freq: Once | ORAL | Status: AC
  Administered 2018-03-31: 1 via ORAL
  Filled 2018-03-31: qty 1

## 2018-03-31 MED ORDER — CLONIDINE HCL 0.1 MG PO TABS
0.1000 mg | ORAL_TABLET | Freq: Two times a day (BID) | ORAL | Status: DC
  Administered 2018-03-31 – 2018-04-01 (×3): 0.1 mg via ORAL
  Filled 2018-03-31 (×3): qty 1

## 2018-03-31 MED ORDER — HYDRALAZINE HCL 50 MG PO TABS
25.0000 mg | ORAL_TABLET | Freq: Three times a day (TID) | ORAL | Status: DC
  Administered 2018-03-31: 25 mg via ORAL
  Filled 2018-03-31: qty 1

## 2018-03-31 MED ORDER — NICARDIPINE HCL IN NACL 20-0.86 MG/200ML-% IV SOLN
3.0000 mg/h | INTRAVENOUS | Status: DC
  Administered 2018-03-31: 7.5 mg/h via INTRAVENOUS
  Administered 2018-03-31 (×2): 5 mg/h via INTRAVENOUS
  Administered 2018-03-31: 10 mg/h via INTRAVENOUS
  Administered 2018-04-01: 6 mg/h via INTRAVENOUS
  Administered 2018-04-01: 7 mg/h via INTRAVENOUS
  Administered 2018-04-01: 4 mg/h via INTRAVENOUS
  Administered 2018-04-01: 3 mg/h via INTRAVENOUS
  Administered 2018-04-02: 6 mg/h via INTRAVENOUS
  Administered 2018-04-02: 4 mg/h via INTRAVENOUS
  Filled 2018-03-31 (×11): qty 200

## 2018-03-31 MED ORDER — METOPROLOL TARTRATE 5 MG/5ML IV SOLN
5.0000 mg | Freq: Once | INTRAVENOUS | Status: AC
  Administered 2018-03-31: 5 mg via INTRAVENOUS
  Filled 2018-03-31: qty 5

## 2018-03-31 MED ORDER — ROPINIROLE HCL 1 MG PO TABS
0.5000 mg | ORAL_TABLET | Freq: Every day | ORAL | Status: DC
  Administered 2018-03-31 – 2018-04-03 (×4): 0.5 mg via ORAL
  Filled 2018-03-31: qty 2
  Filled 2018-03-31: qty 1
  Filled 2018-03-31 (×3): qty 2

## 2018-03-31 MED ORDER — VALPROATE SODIUM 500 MG/5ML IV SOLN
500.0000 mg | Freq: Once | INTRAVENOUS | Status: AC
  Administered 2018-03-31: 500 mg via INTRAVENOUS
  Filled 2018-03-31: qty 5

## 2018-03-31 MED ORDER — ALPRAZOLAM 0.5 MG PO TABS
0.2500 mg | ORAL_TABLET | Freq: Three times a day (TID) | ORAL | Status: DC | PRN
  Administered 2018-03-31 – 2018-04-04 (×8): 0.25 mg via ORAL
  Filled 2018-03-31 (×10): qty 1

## 2018-03-31 MED ORDER — AMLODIPINE BESYLATE 10 MG PO TABS
10.0000 mg | ORAL_TABLET | Freq: Every day | ORAL | Status: DC
  Administered 2018-03-31 – 2018-04-04 (×5): 10 mg via ORAL
  Filled 2018-03-31 (×5): qty 1

## 2018-03-31 NOTE — Progress Notes (Signed)
[facility]  ROUNDING NOTE   Subjective:   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  Patient states he woke up with a headache and systolic greater than 132G. He took an extra dose of clonidine and hydralazine however this did not help.   Last hemodialysis was Saturday.   Objective:  Vital signs in last 24 hours:  Temp:  [97.7 F (36.5 C)-97.8 F (36.6 C)] 97.8 F (36.6 C) (10/28 0800) Pulse Rate:  [48-84] 69 (10/28 0945) Resp:  [0-25] 14 (10/28 0945) BP: (136-194)/(90-130) 152/104 (10/28 0945) SpO2:  [89 %-97 %] 93 % (10/28 0945) Weight:  [78 kg-79.7 kg] 79.7 kg (10/28 0800)  Weight change:  Filed Weights   03/31/18 0147 03/31/18 0800  Weight: 78 kg 79.7 kg    Intake/Output: No intake/output data recorded.   Intake/Output this shift:  Total I/O In: 198.9 [I.V.:198.9] Out: 125 [Urine:125]  Physical Exam: General: NAD, laying in bed  Head: Normocephalic, atraumatic. Moist oral mucosal membranes  Eyes: Anicteric, PERRL  Neck: Supple, trachea midline  Lungs:  Clear to auscultation  Heart: Regular rate and rhythm  Abdomen:  Soft, nontender,   Extremities:  1+ peripheral edema.  Neurologic: Nonfocal, moving all four extremities  Skin: No lesions  Access: RIJ permcath    Basic Metabolic Panel: Recent Labs  Lab 03/31/18 0225 03/31/18 0654  NA 144  --   K 3.7  --   CL 105  --   CO2 29  --   GLUCOSE 91  --   BUN 41*  --   CREATININE 8.55*  --   CALCIUM 8.2*  --   MG  --  1.9  PHOS  --  5.4*    Liver Function Tests: Recent Labs  Lab 03/31/18 0654  AST 24  ALT 16  ALKPHOS 41  BILITOT 0.9  PROT 6.7  ALBUMIN 3.9   No results for input(s): LIPASE, AMYLASE in the last 168 hours. No results for  input(s): AMMONIA in the last 168 hours.  CBC: Recent Labs  Lab 03/31/18 0225  WBC 6.6  NEUTROABS 4.4  HGB 9.0*  HCT 29.4*  MCV 100.3*  PLT 105*    Cardiac Enzymes: No results for input(s): CKTOTAL, CKMB, CKMBINDEX, TROPONINI in the last 168 hours.  BNP: Invalid input(s): POCBNP  CBG: Recent Labs  Lab 03/31/18 0806  GLUCAP 78    Microbiology: Results for orders placed or performed during the hospital encounter of 11/25/17  Culture, blood (routine x 2)     Status: None   Collection Time: 11/25/17  4:56 PM  Result Value Ref Range Status   Specimen Description BLOOD BLOOD LEFT HAND  Final   Special Requests   Final    BOTTLES DRAWN AEROBIC AND ANAEROBIC Blood Culture adequate volume   Culture   Final    NO GROWTH 5 DAYS Performed at [facility], [address], [place], [place] [address]    Report Status 11/30/2017 FINAL  Final  Culture, blood (routine x 2)     Status: None   Collection Time: 11/25/17  4:56 PM  Result Value Ref Range Status   Specimen Description BLOOD RIGHT WRIST  Final   Special Requests   Final    BOTTLES DRAWN AEROBIC AND ANAEROBIC Blood Culture results may not be  optimal due to an excessive volume of blood received in culture bottles   Culture   Final    NO GROWTH 5 DAYS Performed at [facility], [address] [place], [place] [address]    Report Status 11/30/2017 FINAL  Final  MRSA PCR Screening     Status: Abnormal   Collection Time: 11/25/17 10:43 PM  Result Value Ref Range Status   MRSA by PCR POSITIVE (A) NEGATIVE Final    Comment:        The GeneXpert MRSA Assay (FDA approved for NASAL specimens only), is one component of a comprehensive MRSA colonization surveillance program. It is not intended to diagnose MRSA infection nor to guide or monitor treatment for MRSA infections. c/ CRYSTAL BASS @0030  11/26/17 [facility] Performed at [facility], [address] [place], [place] [address]      Coagulation Studies: No results for input(s): LABPROT, INR in the last 72 hours.  Urinalysis: No results for input(s): COLORURINE, LABSPEC, PHURINE, GLUCOSEU, HGBUR, BILIRUBINUR, KETONESUR, PROTEINUR, UROBILINOGEN, NITRITE, LEUKOCYTESUR in the last 72 hours.  Invalid input(s): APPERANCEUR    Imaging: Ct Head Wo Contrast  Result Date: 03/31/2018 CLINICAL DATA:  32 year old male with headache and hypertension. EXAM: CT HEAD WITHOUT CONTRAST TECHNIQUE: Contiguous axial images were obtained from the base of the skull through the vertex without intravenous contrast. COMPARISON:  Head CT dated 02/05/2018 FINDINGS: Brain: No evidence of acute infarction, hemorrhage, hydrocephalus, extra-axial collection or mass lesion/mass effect. Vascular: No hyperdense vessel or unexpected calcification. Skull: Normal. Negative for fracture or focal lesion. Sinuses/Orbits: No acute finding. Other: None IMPRESSION: Normal noncontrast CT of the brain. Electronically Signed   By: Anner Crete M.D.   On: 03/31/2018 03:04     Medications:   . niCARDipine     . amLODipine  10 mg Oral Daily  . calcium acetate  1,334 mg Oral TID WC  . cloNIDine  0.1 mg Oral BID  . feeding supplement (NEPRO CARB STEADY)  237 mL Oral BID BM  . fentaNYL      . heparin  5,000 Units Subcutaneous Q8H  . hydrALAZINE  50 mg Oral TID  . losartan  100 mg Oral Daily  . multivitamin  1 tablet Oral QHS  . pantoprazole  40 mg Oral Daily  . predniSONE  20 mg Oral Q breakfast  . rOPINIRole  0.5 mg Oral QHS   acetaminophen **OR** acetaminophen, bisacodyl, calcium carbonate, ondansetron **OR** ondansetron (ZOFRAN) IV, oxyCODONE-acetaminophen, senna-docusate  Assessment/ Plan:  [name] is a 32 y.o. white male with end stage renal disease on hemodialysis, hypertension, crescentic glomerulonephritis, who was admitted to [facility] on 10/28 for hypertension.   TTS [facility] RIJ permcath 79 kg  1. End Stage Renal  Disease: TTS schedule. No indication for dialysis today.  - dialysis for tomorrow and resume TTS schedule  2. Hypertension: urgency on admission. Placed on nicardipine gtt.  Home regimen of clonidine 0.37mcg bid, carvedilol 6.25mg  bid, hydralazine 50mg  tid, losartan 100mg  daily, amlodipine 10mg  daily - resume home regimen  3. Vasculitis:  - Check urine drug screen - Increase prednisone to 20mg  daily.   4. Anemia of chronic kidney disease: hemoglobin 9. Macrocytic. With thrombocytopenia.  - EPO with HD treatment if blood pressure allows.   5. Secondary Hyperparathyroidism with hyperphosphatemia. outpatient PTH 259.  Calcium at goal.  - calcium acetate 3 tabs with meals.    LOS: 0 Arlisha Patalano 10/28/201910:01 AM

## 2018-03-31 NOTE — Consult Note (Signed)
Name: Jeffrey Holloway MRN: [id number] DOB: Sep 01, 1985     CONSULTATION DATE: 03/31/2018 32 years old gentleman with history of end-stage renal disease on dialysis, hypertension, GERD.  Patient presented to ED with worsening headache and photophobia.  He was found to be in hypertensive emergency with blood pressure recorded 189/125 and had to be started on nicardipine drip for blood pressure control. All history was obtained from admitting physician, the patient and EMR.  The patient mentioned that he has been compliant with his antihypertensive medications. Patient arrived to the intensive care unit on a Cardene drip titrated up to 10 mics per KG per minute, no distress, no chest pain. HISTORY OF PRESENT ILLNESS:   PAST MEDICAL HISTORY :   has a past medical history of Constipation, Diarrhea, Glomerulonephritis, Hemorrhoids, Kidney infection, Nausea and vomiting, Occult blood in stools, Shortness of breath, and Sleep apnea.  has a past surgical history that includes Appendectomy and DIALYSIS/PERMA CATHETER INSERTION (N/A, 10/15/2017). Prior to Admission medications   Medication Sig Start Date End Date Taking? Authorizing Provider  amLODipine (NORVASC) 10 MG tablet Take 1 tablet (10 mg total) by mouth daily. 10/18/17  Yes Epifanio Lesches, MD  calcium acetate (PHOSLO) 667 MG capsule Take 2 capsules (1,334 mg total) by mouth 3 (three) times daily with meals. 10/18/17  Yes Epifanio Lesches, MD  calcium carbonate (TUMS - DOSED IN MG ELEMENTAL CALCIUM) 500 MG chewable tablet Chew 1 tablet by mouth 2 (two) times daily.   Yes [provider]  cloNIDine (CATAPRES) 0.1 MG tablet Take 1 tablet (0.1 mg total) by mouth 2 (two) times daily. 10/18/17  Yes Epifanio Lesches, MD  hydrALAZINE (APRESOLINE) 25 MG tablet Take 1 tablet (25 mg total) by mouth 3 (three) times daily. Patient taking differently: Take 50 mg by mouth 3 (three) times daily.  12/30/17 12/30/18 Yes Verones[place], [place], MD    losartan (COZAAR) 100 MG tablet Take 1 tablet (100 mg total) by mouth daily. 10/18/17  Yes Epifanio Lesches, MD  multivitamin (RENA-VIT) TABS tablet Take 1 tablet by mouth at bedtime. 10/18/17  Yes Epifanio Lesches, MD  Nutritional Supplements (FEEDING SUPPLEMENT, NEPRO CARB STEADY,) LIQD Take 237 mLs by mouth 2 (two) times daily between meals. 10/18/17  Yes Epifanio Lesches, MD  omeprazole (PRILOSEC) 20 MG capsule Take 20 mg by mouth daily.   Yes [provider]  predniSONE (DELTASONE) 20 MG tablet Take 1.5 tablets (30 mg total) by mouth daily with breakfast. Patient taking differently: Take 20 mg by mouth daily with breakfast.  10/19/17  Yes Epifanio Lesches, MD  rOPINIRole (REQUIP) 0.5 MG tablet Take 1 tablet by mouth at bedtime. 03/21/18  Yes [provider]  cefdinir (OMNICEF) 300 MG capsule Take 1 capsule (300 mg total) by mouth 2 (two) times daily. Patient not taking: Reported on 03/31/2018 11/27/17   Salary, Avel Peace, MD  mupirocin ointment (BACTROBAN) 2 % Place 1 application into the nose 2 (two) times daily. Patient not taking: Reported on 03/31/2018 11/27/17   Salary, Holly Bodily D, MD  ondansetron (ZOFRAN ODT) 4 MG disintegrating tablet Take 1 tablet (4 mg total) by mouth every 8 (eight) hours as needed for nausea or vomiting. Patient not taking: Reported on 03/31/2018 02/05/18   Rudene Re, MD  oxyCODONE (OXY IR/ROXICODONE) 5 MG immediate release tablet Take 1-2 tablets (5-10 mg total) by mouth every 6 (six) hours as needed for moderate pain. Patient not taking: Reported on 11/25/2017 10/18/17   Epifanio Lesches, MD  traMADol (ULTRAM) 50 MG  tablet Take 1 tablet (50 mg total) by mouth every 6 (six) hours as needed for moderate pain. Patient not taking: Reported on 03/31/2018 11/27/17   Salary, Avel Peace, MD   No Known Allergies  FAMILY HISTORY:  family history is not on file. SOCIAL HISTORY:  reports that he has been smoking cigarettes. He has been  smoking about 0.25 packs per day. He has never used smokeless tobacco. He reports that he drinks alcohol. He reports that he has current or past drug history. Drugs: Marijuana and Cocaine.  REVIEW OF SYSTEMS:   Unable to obtain due to critical illness   VITAL SIGNS: Temp:  [97.7 F (36.5 C)-97.8 F (36.6 C)] 97.8 F (36.6 C) (10/28 0800) Pulse Rate:  [48-84] 76 (10/28 0900) Resp:  [0-25] 19 (10/28 0900) BP: (143-194)/(90-130) 151/90 (10/28 0900) SpO2:  [89 %-97 %] 97 % (10/28 0900) Weight:  [78 kg-79.7 kg] 79.7 kg (10/28 0800)  Physical Examination:  Awake and oriented with no focal neurological deficits On room air, no distress, able to talk in full sentences, bilateral equal air entry with no adventitious sounds S1 and S2 are audible with no murmur Benign abdominal exam No peripheral edema    ASSESSMENT / PLAN:  Hypertensive emergency with headaches.  No acute intracranial abnormalities on CT head -Optimize antihypertensives and monitor hemodynamics  End-stage renal disease on HD as per renal.  History of glomerular nephritis and has been in the steroids of prednisone 20 mg p.o. once a day  GERD -PPI  Anemia -Keep hemoglobin more than 7 g/dL  Thrombocytopenia -Monitor platelet count  Full code  Supportive care  Critical care time 40 minutes

## 2018-03-31 NOTE — ED Provider Notes (Signed)
[facility] Emergency Department Provider Note   ____________________________________________   First MD Initiated Contact with Patient 03/31/18 0143     (approximate)  I have reviewed the triage vital signs and the nursing notes.   HISTORY  Chief Complaint Headache   HPI Jeffrey Holloway is a 32 y.o. male brought to the ED from home via EMS with a chief complaint of headache.  Patient has a history of end-stage renal disease on hemodialysis Tuesday/Thursday/Saturday.  Has a history of postdialysis headaches.  States he awoke with his typical posterior type headache associated with photophobia.  Only thing that was unusual was that the headache occurred in the middle of the night rather than immediately after dialysis.  States he doubled up on his blood pressure medicines including hydralazine and took a clonidine 10 minutes prior to EMS arrival.  Denies associated fever, chills, neck pain, chest pain, shortness of breath, abdominal pain, nausea or vomiting.  Denies recent travel or trauma.   Past Medical History:  Diagnosis Date  . Constipation   . Diarrhea   . Glomerulonephritis   . Hemorrhoids   . Kidney infection   . Nausea and vomiting   . Occult blood in stools   . Shortness of breath   . Sleep apnea     Patient Active Problem List   Diagnosis Date Noted  . End stage renal disease ([place]) 01/22/2018  . Essential hypertension 01/22/2018  . Cellulitis 11/25/2017  . 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 GERD (gastroesophageal reflux disease) 10/10/2017  . Rectal foreign body     Past Surgical History:  Procedure Laterality Date  . APPENDECTOMY    . DIALYSIS/PERMA CATHETER INSERTION N/A 10/15/2017   Procedure: DIALYSIS/PERMA CATHETER INSERTION;  Surgeon: Katha Cabal, MD;  Location: [facility];  Service: Cardiovascular;  Laterality: N/A;    Prior to Admission medications   Medication Sig Start Date End Date Taking?  Authorizing Provider  amLODipine (NORVASC) 10 MG tablet Take 1 tablet (10 mg total) by mouth daily. 10/18/17   Epifanio Lesches, MD  calcium acetate (PHOSLO) 667 MG capsule Take 2 capsules (1,334 mg total) by mouth 3 (three) times daily with meals. 10/18/17   Epifanio Lesches, MD  calcium carbonate (TUMS - DOSED IN MG ELEMENTAL CALCIUM) 500 MG chewable tablet Chew 1 tablet by mouth 2 (two) times daily.    [provider]  cefdinir (OMNICEF) 300 MG capsule Take 1 capsule (300 mg total) by mouth 2 (two) times daily. 11/27/17   Salary, Avel Peace, MD  cloNIDine (CATAPRES) 0.1 MG tablet Take 1 tablet (0.1 mg total) by mouth 2 (two) times daily. 10/18/17   Epifanio Lesches, MD  hydrALAZINE (APRESOLINE) 25 MG tablet Take 1 tablet (25 mg total) by mouth 3 (three) times daily. 12/30/17 12/30/18  Rudene Re, MD  losartan (COZAAR) 100 MG tablet Take 1 tablet (100 mg total) by mouth daily. 10/18/17   Epifanio Lesches, MD  multivitamin (RENA-VIT) TABS tablet Take 1 tablet by mouth at bedtime. 10/18/17   Epifanio Lesches, MD  mupirocin ointment (BACTROBAN) 2 % Place 1 application into the nose 2 (two) times daily. 11/27/17   Salary, Avel Peace, MD  Nutritional Supplements (FEEDING SUPPLEMENT, NEPRO CARB STEADY,) LIQD Take 237 mLs by mouth 2 (two) times daily between meals. 10/18/17   Epifanio Lesches, MD  omeprazole (PRILOSEC) 20 MG capsule Take 20 mg by mouth daily.    [provider]  ondansetron (ZOFRAN ODT) 4 MG disintegrating tablet Take  1 tablet (4 mg total) by mouth every 8 (eight) hours as needed for nausea or vomiting. 02/05/18   Rudene Re, MD  oxyCODONE (OXY IR/ROXICODONE) 5 MG immediate release tablet Take 1-2 tablets (5-10 mg total) by mouth every 6 (six) hours as needed for moderate pain. Patient not taking: Reported on 11/25/2017 10/18/17   Epifanio Lesches, MD  predniSONE (DELTASONE) 20 MG tablet Take 1.5 tablets (30 mg total) by mouth daily with  breakfast. Patient taking differently: Take 20 mg by mouth daily with breakfast.  10/19/17   Epifanio Lesches, MD  traMADol (ULTRAM) 50 MG tablet Take 1 tablet (50 mg total) by mouth every 6 (six) hours as needed for moderate pain. 11/27/17   Salary, Avel Peace, MD    Allergies Patient has no known allergies.  Family History  Problem Relation Age of Onset  . Varicose Veins Neg Hx   . Vision loss Neg Hx   . Stroke Neg Hx     Social History Social History   Tobacco Use  . Smoking status: Current Every Day Smoker    Packs/day: 0.25    Types: Cigarettes  . Smokeless tobacco: Never Used  Substance Use Topics  . Alcohol use: Yes  . Drug use: Yes    Types: Marijuana, Cocaine    Review of Systems  Constitutional: No fever/chills Eyes: No visual changes. ENT: No sore throat. Cardiovascular: Denies chest pain. Respiratory: Denies shortness of breath. Gastrointestinal: No abdominal pain.  No nausea, no vomiting.  No diarrhea.  No constipation. Genitourinary: Negative for dysuria. Musculoskeletal: Negative for back pain. Skin: Negative for rash. Neurological: Positive for headache.  Negative for focal weakness or numbness.   ____________________________________________   PHYSICAL EXAM:  VITAL SIGNS: ED Triage Vitals  Enc Vitals Group     BP      Pulse      Resp      Temp      Temp src      SpO2      Weight      Height      Head Circumference      Peak Flow      Pain Score      Pain Loc      Pain Edu?      Excl. in [place]?     Constitutional: Alert and oriented.  Chronically ill appearing and in mild to moderate acute distress. Eyes: Conjunctivae are normal. PERRL. EOMI. Head: Atraumatic. Nose: No congestion/rhinnorhea. Mouth/Throat: Mucous membranes are moist.  Oropharynx non-erythematous. Neck: No stridor.  No cervical spine tenderness to palpation.  Supple neck without meningismus.  No carotid bruits. Cardiovascular: Normal rate, regular rhythm. Grossly  normal heart sounds.  Good peripheral circulation. Respiratory: Normal respiratory effort.  No retractions. Lungs CTAB.  Chest hemodialysis catheter in place. Gastrointestinal: Soft and nontender. No distention. No abdominal bruits. No CVA tenderness.  Peritoneal catheter in place. Musculoskeletal: No lower extremity tenderness. 1+ BLE nonpitting edema.  No joint effusions. Neurologic: Alert and oriented x3.  CN II-XII grossly intact.  Normal speech and language. No gross focal neurologic deficits are appreciated. MAEx4. Skin:  Skin is warm, dry and intact. No rash noted. Psychiatric: Mood and affect are normal. Speech and behavior are normal.  ____________________________________________   LABS (all labs ordered are listed, but only abnormal results are displayed)  Labs Reviewed  CBC WITH DIFFERENTIAL/PLATELET - Abnormal; Notable for the following components:      Result Value   RBC 2.93 (*)  Hemoglobin 9.0 (*)    HCT 29.4 (*)    MCV 100.3 (*)    RDW 17.5 (*)    Platelets 105 (*)    nRBC 0.6 (*)    Abs Immature Granulocytes 0.28 (*)    All other components within normal limits  BASIC METABOLIC PANEL - Abnormal; Notable for the following components:   BUN 41 (*)    Creatinine, Ser 8.55 (*)    Calcium 8.2 (*)    GFR calc non Af Amer 7 (*)    GFR calc Af Amer 8 (*)    All other components within normal limits   ____________________________________________  EKG  ED ECG REPORT I, SUNG,JADE J, the attending physician, personally viewed and interpreted this ECG.   Date: 03/31/2018  EKG Time: 0151  Rate: 69  Rhythm: normal EKG, normal sinus rhythm  Axis: Normal  Intervals:none  ST&T Change: Nonspecific  ____________________________________________  RADIOLOGY  ED MD interpretation: No ICH  Official radiology report(s): Ct Head Wo Contrast  Result Date: 03/31/2018 CLINICAL DATA:  32 year old male with headache and hypertension. EXAM: CT HEAD WITHOUT CONTRAST  TECHNIQUE: Contiguous axial images were obtained from the base of the skull through the vertex without intravenous contrast. COMPARISON:  Head CT dated 02/05/2018 FINDINGS: Brain: No evidence of acute infarction, hemorrhage, hydrocephalus, extra-axial collection or mass lesion/mass effect. Vascular: No hyperdense vessel or unexpected calcification. Skull: Normal. Negative for fracture or focal lesion. Sinuses/Orbits: No acute finding. Other: None IMPRESSION: Normal noncontrast CT of the brain. Electronically Signed   By: Anner Crete M.D.   On: 03/31/2018 03:04    ____________________________________________   PROCEDURES  Procedure(s) performed: None  Procedures  Critical Care performed: Yes, see critical care note(s)   CRITICAL CARE Performed by: Paulette Blanch   Total critical care time: 45 minutes  Critical care time was exclusive of separately billable procedures and treating other patients.  Critical care was necessary to treat or prevent imminent or life-threatening deterioration.  Critical care was time spent personally by me on the following activities: development of treatment plan with patient and/or surrogate as well as nursing, discussions with consultants, evaluation of patient's response to treatment, examination of patient, obtaining history from patient or surrogate, ordering and performing treatments and interventions, ordering and review of laboratory studies, ordering and review of radiographic studies, pulse oximetry and re-evaluation of patient's condition.  ____________________________________________   INITIAL IMPRESSION / ASSESSMENT AND PLAN / ED COURSE  As part of my medical decision making, I reviewed the following data within the [place] notes reviewed and incorporated, Labs reviewed, EKG interpreted, Old chart reviewed, Radiograph reviewed and Notes from prior ED visits   32 year old male who presents with headache.  History  of ESRD on HD Tuesday/Thursday/Saturday with prior history of post-dialysis associated headaches. Differential diagnosis includes, but is not limited to, intracranial hemorrhage, meningitis/encephalitis, previous head trauma, cavernous venous thrombosis, tension headache, temporal arteritis, migraine or migraine equivalent, idiopathic intracranial hypertension, and non-specific headache.  Elevated blood pressure noted.  Patient doubled up on his antihypertensives plus took a clonidine 10 minutes prior to arrival.  Will closely monitor but administer IV analgesia first.  Patient reports in the past he has received IV morphine, fentanyl and Dilaudid for his headaches.  Will obtain CT head to evaluate for intracranial hemorrhage.  Will reassess.  Has peritoneal dialysis in place but has not yet started.  Still currently taking hemodialysis.  Clinical Course as of Mar 31 508  Mon Mar 31, 2018  0338 Improved somewhat after Dilaudid but states headache is returning.  Neck remains supple; patient remains neurologically intact without focal deficits.  Updated patient and spouse on CT and laboratory results.  Will administer IV morphine, administer oral oxycodone.  Recycle blood pressure and administer IV hydralazine as needed.   [JS]  0507 Blood pressure 192/122 after IV hydralazine.  Blood pressure remains elevated despite patient doubling his antihypertensives last evening plus extra dose of clonidine prior to arrival.  Additionally, patient is wearing clonidine patch.  Has received multiple rounds of IV opiates and 10 mg IV hydralazine without budging blood pressure.  Will discuss with hospitalist to evaluate patient in the emergency department for admission.   [JS]    Clinical Course User Index [JS] Paulette Blanch, MD     ____________________________________________   FINAL CLINICAL IMPRESSION(S) / ED DIAGNOSES  Final diagnoses:  Acute nonintractable headache, unspecified headache type    Renovascular hypertension  ESRD (end stage renal disease) on dialysis [facility])  Hypertensive urgency     ED Discharge Orders    None       Note:  This document was prepared using Dragon voice recognition software and may include unintentional dictation errors.    Paulette Blanch, MD 03/31/18 [phone]

## 2018-03-31 NOTE — Progress Notes (Signed)
RN made Dr. Soyla Murphy aware that patient continues to complain of headache 10/10 on pain scale.  MD gave order for one time dose of IV fentanyl 50 mcg.

## 2018-03-31 NOTE — Progress Notes (Signed)
RN made Dr. Soyla Murphy aware that patient's head is still hurting and rates pain 9/10 on pain scale and asked MD for cardene drip parameters.  MD gave order for 25 mcg one time dose of IV fentanyl and to titrate cardene drip to keep Systolic blood pressure 300-762.

## 2018-03-31 NOTE — Care Management Note (Signed)
Case Management Note  Patient Details  Name: Jeffrey Holloway MRN: [id number] Date of Birth: 10-15-1985  Subjective/Objective:     Elvera Bicker with [facility] notified of admission to ICU.     Patient admitted with hypertensive crisis.  He is a current dialysis patient.              Action/Plan:   Expected Discharge Date:                  Expected Discharge Plan:     In-House Referral:     Discharge planning Services     Post Acute Care Choice:    Choice offered to:     DME Arranged:    DME Agency:     HH Arranged:    HH Agency:     Status of Service:     If discussed at [employer] of Stay Meetings, dates discussed:    Additional Comments:  Shelbie Hutching, RN 03/31/2018, 11:00 AM

## 2018-03-31 NOTE — ED Notes (Addendum)
Called floor to give report to floor was placed on hold. This RN Arboriculturist.

## 2018-03-31 NOTE — ED Triage Notes (Signed)
Pt arrived from home via EMS with complaints of a HA that woke him up from sleep. Pt has Hx of Stage 5 renal failure. Pt reports also having blurred vision.  Pt does dialysis every Tuesday, Thursday, and Saturday. Pt states that he does have issues regulating his BP. VS per EMS BP-215/103 HR-74 BS-118. EMS placed an 18 gauge in right hand. EMS reported pt was Negative on the stroke scale and has edema in peripheral limbs. Pt is not on any fluid restrictions. 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 Pt is alert and oriented x 4.

## 2018-03-31 NOTE — Progress Notes (Signed)
Patient ID: MAKENZIE VITTORIO, male   DOB: 1985/12/31, 32 y.o.   MRN: [id number]  [facility] PROGRESS NOTE  SEBASTIEN JACKSON [facility]:[id number] DOB: [birth year] DOA: 03/31/2018 PCP: Patient, No Pcp Per  HPI/Subjective: Patient complaining of headache and was found to have his high blood pressure at home.  He is a dialysis patient and on dialysis Tuesday Thursdays and Saturdays.  No nausea vomiting.  Had some blurred vision that has cleared.  Objective: Vitals:   03/31/18 1530 03/31/18 1600  BP: (!) 156/100 (!) 175/100  Pulse: 68 94  Resp: 12 20  Temp:    SpO2: 97% 97%    Filed Weights   03/31/18 0147 03/31/18 0800  Weight: 78 kg 79.7 kg    ROS: Review of Systems  Constitutional: Negative for chills and fever.  Eyes: Negative for blurred vision.  Respiratory: Negative for cough and shortness of breath.   Cardiovascular: Negative for chest pain.  Gastrointestinal: Negative for abdominal pain, constipation, diarrhea, nausea and vomiting.  Genitourinary: Negative for dysuria.  Musculoskeletal: Negative for joint pain.  Neurological: Positive for headaches. Negative for dizziness.   Exam: Physical Exam  Constitutional: He is oriented to person, place, and time.  HENT:  Nose: No mucosal edema.  Mouth/Throat: No oropharyngeal exudate or posterior oropharyngeal edema.  Eyes: Pupils are equal, round, and reactive to light. Conjunctivae, EOM and lids are normal.  Neck: No JVD present. Carotid bruit is not present. No edema present. No thyroid mass and no thyromegaly present.  Cardiovascular: S1 normal and S2 normal. Exam reveals no gallop.  No murmur heard. Pulses:      Dorsalis pedis pulses are 2+ on the right side, and 2+ on the left side.  Respiratory: No respiratory distress. He has no wheezes. He has no rhonchi. He has no rales.  GI: Soft. Bowel sounds are normal. There is no tenderness.  Musculoskeletal:       Right ankle: He exhibits no swelling.       Left ankle: He  exhibits no swelling.  Lymphadenopathy:    He has no cervical adenopathy.  Neurological: He is alert and oriented to person, place, and time. No cranial nerve deficit.  Skin: Skin is warm. No rash noted. Nails show no clubbing.  Psychiatric: He has a normal mood and affect.      Data Reviewed: Basic Metabolic Panel: Recent Labs  Lab 03/31/18 0225 03/31/18 0654  NA 144  --   K 3.7  --   CL 105  --   CO2 29  --   GLUCOSE 91  --   BUN 41*  --   CREATININE 8.55*  --   CALCIUM 8.2*  --   MG  --  1.9  PHOS  --  5.4*   Liver Function Tests: Recent Labs  Lab 03/31/18 0654  AST 24  ALT 16  ALKPHOS 41  BILITOT 0.9  PROT 6.7  ALBUMIN 3.9   CBC: Recent Labs  Lab 03/31/18 0225  WBC 6.6  NEUTROABS 4.4  HGB 9.0*  HCT 29.4*  MCV 100.3*  PLT 105*    CBG: Recent Labs  Lab 03/31/18 0806  GLUCAP 78    Recent Results (from the past 240 hour(s))  MRSA PCR Screening     Status: None   Collection Time: 03/31/18  8:11 AM  Result Value Ref Range Status   MRSA by PCR NEGATIVE NEGATIVE Final    Comment:        The GeneXpert MRSA Assay (  FDA approved for NASAL specimens only), is one component of a comprehensive MRSA colonization surveillance program. It is not intended to diagnose MRSA infection nor to guide or monitor treatment for MRSA infections. Performed at [facility], [address] [place], [place] [address]      Studies: Ct Head Wo Contrast  Result Date: 03/31/2018 CLINICAL DATA:  32 year old male with headache and hypertension. EXAM: CT HEAD WITHOUT CONTRAST TECHNIQUE: Contiguous axial images were obtained from the base of the skull through the vertex without intravenous contrast. COMPARISON:  Head CT dated 02/05/2018 FINDINGS: Brain: No evidence of acute infarction, hemorrhage, hydrocephalus, extra-axial collection or mass lesion/mass effect. Vascular: No hyperdense vessel or unexpected calcification. Skull: Normal. Negative for fracture or  focal lesion. Sinuses/Orbits: No acute finding. Other: None IMPRESSION: Normal noncontrast CT of the brain. Electronically Signed   By: Anner Crete M.D.   On: 03/31/2018 03:04    Scheduled Meds: . amLODipine  10 mg Oral Daily  . calcium acetate  1,334 mg Oral TID WC  . carvedilol  6.25 mg Oral BID WC  . cloNIDine  0.1 mg Oral BID  . feeding supplement (NEPRO CARB STEADY)  237 mL Oral BID BM  . fentaNYL      . heparin  5,000 Units Subcutaneous Q8H  . hydrALAZINE  50 mg Oral TID  . losartan  100 mg Oral Daily  . multivitamin  1 tablet Oral QHS  . pantoprazole  40 mg Oral Daily  . predniSONE  20 mg Oral Q breakfast  . rOPINIRole  0.5 mg Oral QHS   Continuous Infusions: . niCARDipine 5 mg/hr (03/31/18 1526)    Assessment/Plan:  1. 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  Also started on his oral medicines amlodipine Coreg clonidine hydralazine and losartan. 2. Headache.  Hopefully secondary to accelerated hypertension hopefully this will settle down shortly.  Neurology ordered an MRI of the brain. 3. History of vasculitis.  Nephrology increased his prednisone to 20 mg daily 4. Polysubstance abuse.  Cannabis in this urine toxicology previous urine toxicology is positive for cocaine. 5. End-stage renal disease on dialysis Tuesday Thursday Saturday 6. Anxiety depression.  Start low-dose Celexa and as needed Xanax. 7. Anemia of chronic disease 8. Thrombocytopenia.  Previous hepatitis C was negative.  Code Status:     Code Status Orders  (From admission, onward)         Start     Ordered   03/31/18 0806  Full code  Continuous     03/31/18 0805        Code Status History    Date Active Date Inactive Code Status Order ID Comments User Context   11/25/2017 2159 11/27/2017 2231 Full Code [id number]  Nicholes Mango, MD Inpatient   10/10/2017 1338 10/18/2017 2059 Full Code [id number]  Arta Silence, MD ED     Family Communication:  Wife at the bedside Disposition Plan: To be determined  Consultants:  Critical care specialist  Neurology  Nephrology  Time spent: 35 minutes  [place]

## 2018-03-31 NOTE — Progress Notes (Signed)
RN made Dr. Soyla Murphy aware that patient continues to complain of headache 10/10 on pain scale.  Blood pressure improved 145/94 on cardene drip and PO meds given this morning.  MD gave order for one time dose of percocet PO.

## 2018-03-31 NOTE — Progress Notes (Signed)
Patient complains of anxiety stating he feels like he could run a race and asked for something for his anxiety.  RN made Dr. Soyla Murphy aware of statement above and MD gave order for one time dose of xanax PO 0.25 mg.

## 2018-03-31 NOTE — Progress Notes (Signed)
Dr. Soyla Murphy gave order to change hydralazine to 50 mg TID PO like patient takes at home and to order norvasc 10 mg PO daily as well.

## 2018-03-31 NOTE — Consult Note (Signed)
Reason for Consult:Headache Referring Physician: Samaan  CC: Headache  HPI: Jeffrey Holloway is an 32 y.o. male with a history of ESRD and headache since March of this year.  Reports that he has headaches when his BP is high and about 2-3 times a week with dialysis.  Was admitted on 10/28 with hypertensive urgency.  BP now improved but continues to have headache.  Headache starts at the base of his neck and radiates to his forehead.  He reports that it is a 10/10.  Has not been significantly helped with narcotics.  Describes the pain as throbbing.  Also reports photophobia but no visual changes.  Patient is on Prednisone.  Reports ice pack helps.  Past Medical History:  Diagnosis Date  . Constipation   . Diarrhea   . Glomerulonephritis   . Hemorrhoids   . Kidney infection   . Nausea and vomiting   . Occult blood in stools   . Shortness of breath   . Sleep apnea     Past Surgical History:  Procedure Laterality Date  . APPENDECTOMY    . DIALYSIS/PERMA CATHETER INSERTION N/A 10/15/2017   Procedure: DIALYSIS/PERMA CATHETER INSERTION;  Surgeon: Katha Cabal, MD;  Location: [place] [facility];  Service: Cardiovascular;  Laterality: N/A;    Family History  Problem Relation Age of Onset  . Varicose Veins Neg Hx   . Vision loss Neg Hx   . Stroke Neg Hx     Social History:  reports that he has been smoking cigarettes. He has been smoking about 0.25 packs per day. He has never used smokeless tobacco. He reports that he drinks alcohol. He reports that he has current or past drug history. Drugs: Marijuana and Cocaine.  No Known Allergies  Medications:  I have reviewed the patient's current medications. Prior to Admission:  Medications Prior to Admission  Medication Sig Dispense Refill Last Dose  . amLODipine (NORVASC) 10 MG tablet Take 1 tablet (10 mg total) by mouth daily. 30 tablet 0 03/30/2018 at Unknown time  . calcium acetate (PHOSLO) 667 MG capsule Take 2 capsules (1,334 mg  total) by mouth 3 (three) times daily with meals. 60 capsule 0 03/30/2018 at Unknown time  . calcium carbonate (TUMS - DOSED IN MG ELEMENTAL CALCIUM) 500 MG chewable tablet Chew 1 tablet by mouth 2 (two) times daily.   prn at prn  . cloNIDine (CATAPRES) 0.1 MG tablet Take 1 tablet (0.1 mg total) by mouth 2 (two) times daily. 60 tablet 1 03/30/2018 at Unknown time  . hydrALAZINE (APRESOLINE) 25 MG tablet Take 1 tablet (25 mg total) by mouth 3 (three) times daily. (Patient taking differently: Take 50 mg by mouth 3 (three) times daily. ) 90 tablet 1 03/30/2018 at Unknown time  . losartan (COZAAR) 100 MG tablet Take 1 tablet (100 mg total) by mouth daily. 30 tablet 0 03/30/2018 at Unknown time  . multivitamin (RENA-VIT) TABS tablet Take 1 tablet by mouth at bedtime. 30 tablet 0 03/30/2018 at Unknown time  . Nutritional Supplements (FEEDING SUPPLEMENT, NEPRO CARB STEADY,) LIQD Take 237 mLs by mouth 2 (two) times daily between meals. 30 Can 0 Past Week at Unknown time  . omeprazole (PRILOSEC) 20 MG capsule Take 20 mg by mouth daily.   03/30/2018 at Unknown time  . predniSONE (DELTASONE) 20 MG tablet Take 1.5 tablets (30 mg total) by mouth daily with breakfast. (Patient taking differently: Take 20 mg by mouth daily with breakfast. ) 30 tablet 0 03/30/2018 at  Unknown time  . rOPINIRole (REQUIP) 0.5 MG tablet Take 1 tablet by mouth at bedtime.  6 Past Week at Unknown time  . cefdinir (OMNICEF) 300 MG capsule Take 1 capsule (300 mg total) by mouth 2 (two) times daily. (Patient not taking: Reported on 03/31/2018) 10 capsule 0 Not Taking at Unknown time  . mupirocin ointment (BACTROBAN) 2 % Place 1 application into the nose 2 (two) times daily. (Patient not taking: Reported on 03/31/2018) 22 g 0 Completed Course at Unknown time  . ondansetron (ZOFRAN ODT) 4 MG disintegrating tablet Take 1 tablet (4 mg total) by mouth every 8 (eight) hours as needed for nausea or vomiting. (Patient not taking: Reported on 03/31/2018)  20 tablet 0 Not Taking at Unknown time  . oxyCODONE (OXY IR/ROXICODONE) 5 MG immediate release tablet Take 1-2 tablets (5-10 mg total) by mouth every 6 (six) hours as needed for moderate pain. (Patient not taking: Reported on 11/25/2017) 30 tablet 0 Not Taking at Unknown time  . traMADol (ULTRAM) 50 MG tablet Take 1 tablet (50 mg total) by mouth every 6 (six) hours as needed for moderate pain. (Patient not taking: Reported on 03/31/2018) 10 tablet 0 Not Taking at Unknown time   Scheduled: . amLODipine  10 mg Oral Daily  . calcium acetate  1,334 mg Oral TID WC  . carvedilol  6.25 mg Oral BID WC  . [START ON 04/01/2018] Chlorhexidine Gluconate Cloth  6 each Topical Q0600  . cloNIDine  0.1 mg Oral BID  . feeding supplement (NEPRO CARB STEADY)  237 mL Oral BID BM  . fentaNYL      . heparin  5,000 Units Subcutaneous Q8H  . hydrALAZINE  50 mg Oral TID  . losartan  100 mg Oral Daily  . multivitamin  1 tablet Oral QHS  . pantoprazole  40 mg Oral Daily  . predniSONE  20 mg Oral Q breakfast  . rOPINIRole  0.5 mg Oral QHS    ROS: History obtained from the patient  General ROS: negative for - chills, fatigue, fever, night sweats, weight gain or weight loss Psychological ROS: negative for - behavioral disorder, hallucinations, memory difficulties, mood swings or suicidal ideation Ophthalmic ROS: negative for - blurry vision, double vision, eye pain or loss of vision ENT ROS: negative for - epistaxis, nasal discharge, oral lesions, sore throat, tinnitus or vertigo Allergy and Immunology ROS: negative for - hives or itchy/watery eyes Hematological and Lymphatic ROS: negative for - bleeding problems, bruising or swollen lymph nodes Endocrine ROS: negative for - galactorrhea, hair pattern changes, polydipsia/polyuria or temperature intolerance Respiratory ROS: negative for - cough, hemoptysis, shortness of breath or wheezing Cardiovascular ROS: LE edema Gastrointestinal ROS: negative for - abdominal  pain, diarrhea, hematemesis, nausea/vomiting or stool incontinence Genito-Urinary ROS: negative for - dysuria, hematuria, incontinence or urinary frequency/urgency Musculoskeletal ROS: negative for - joint swelling or muscular weakness Neurological ROS: as noted in HPI Dermatological ROS: negative for rash and skin lesion changes  Physical Examination: Blood pressure (!) 152/86, pulse 89, temperature (!) 97.5 F (36.4 C), temperature source Oral, resp. rate 20, height 6' (1.829 m), weight 79.7 kg, SpO2 98 %.  HEENT-  Normocephalic, no lesions, without obvious abnormality.  Normal external eye and conjunctiva.  Normal TM's bilaterally.  Normal auditory canals and external ears. Normal external nose, mucus membranes and septum.  Normal pharynx. Cardiovascular- S1, S2 normal, pulses palpable throughout   Lungs- chest clear, no wheezing, rales, normal symmetric air entry Abdomen- soft, non-tender; bowel sounds  normal; no masses,  no organomegaly Extremities- LE edema (left greater than right) Lymph-no adenopathy palpable Musculoskeletal-no joint tenderness, deformity or swelling Skin-warm and dry, no hyperpigmentation, vitiligo, or suspicious lesions  Neurological Examination   Mental Status: Alert, oriented, thought content appropriate.  Speech fluent without evidence of aphasia.  Able to follow 3 step commands without difficulty. Cranial Nerves: II: Discs flat bilaterally; Visual fields grossly normal, pupils equal, round, reactive to light and accommodation III,IV, VI: ptosis not present, extra-ocular motions intact bilaterally V,VII: smile symmetric, facial light touch sensation normal bilaterally VIII: hearing normal bilaterally IX,X: gag reflex present XI: bilateral shoulder shrug XII: midline tongue extension Motor: Right : Upper extremity   5/5    Left:     Upper extremity   5/5  Lower extremity   5/5     Lower extremity   5/5 Tone and bulk:normal tone throughout; no atrophy  noted Sensory: Pinprick and light touch intact throughout, bilaterally Deep Tendon Reflexes: 2+ in the upper extremities, trace at the knees and absent at the ankles bilaterally Plantars: Right: upgoing   Left: upgoing Cerebellar: normal finger-to-nose and normal heel-to-shin testing bilaterally Gait: not tested due to safety concerns    Laboratory Studies:   Basic Metabolic Panel: Recent Labs  Lab 03/31/18 0225 03/31/18 0654  NA 144  --   K 3.7  --   CL 105  --   CO2 29  --   GLUCOSE 91  --   BUN 41*  --   CREATININE 8.55*  --   CALCIUM 8.2*  --   MG  --  1.9  PHOS  --  5.4*    Liver Function Tests: Recent Labs  Lab 03/31/18 0654  AST 24  ALT 16  ALKPHOS 41  BILITOT 0.9  PROT 6.7  ALBUMIN 3.9   No results for input(s): LIPASE, AMYLASE in the last 168 hours. No results for input(s): AMMONIA in the last 168 hours.  CBC: Recent Labs  Lab 03/31/18 0225  WBC 6.6  NEUTROABS 4.4  HGB 9.0*  HCT 29.4*  MCV 100.3*  PLT 105*    Cardiac Enzymes: No results for input(s): CKTOTAL, CKMB, CKMBINDEX, TROPONINI in the last 168 hours.  BNP: Invalid input(s): POCBNP  CBG: Recent Labs  Lab 03/31/18 0806  GLUCAP 2    Microbiology: Results for orders placed or performed during the hospital encounter of 03/31/18  MRSA PCR Screening     Status: None   Collection Time: 03/31/18  8:11 AM  Result Value Ref Range Status   MRSA by PCR NEGATIVE NEGATIVE Final    Comment:        The GeneXpert MRSA Assay (FDA approved for NASAL specimens only), is one component of a comprehensive MRSA colonization surveillance program. It is not intended to diagnose MRSA infection nor to guide or monitor treatment for MRSA infections. Performed at [facility], [address] [place], [place] [address]     Coagulation Studies: No results for input(s): LABPROT, INR in the last 72 hours.  Urinalysis: No results for input(s): COLORURINE, LABSPEC, PHURINE, GLUCOSEU,  HGBUR, BILIRUBINUR, KETONESUR, PROTEINUR, UROBILINOGEN, NITRITE, LEUKOCYTESUR in the last 168 hours.  Invalid input(s): APPERANCEUR  Lipid Panel:  No results found for: CHOL, TRIG, HDL, CHOLHDL, VLDL, LDLCALC  HgbA1C: No results found for: HGBA1C  Urine Drug Screen:      Component Value Date/Time   LABOPIA NONE DETECTED 03/31/2018 0847   COCAINSCRNUR NONE DETECTED 03/31/2018 [place] DETECTED 03/31/2018  0847   AMPHETMU NONE DETECTED 03/31/2018 0847   THCU POSITIVE (A) 03/31/2018 0847   LABBARB NONE DETECTED 03/31/2018 0847    Alcohol Level: No results for input(s): ETH in the last 168 hours.   Imaging: Ct Head Wo Contrast  Result Date: 03/31/2018 CLINICAL DATA:  32 year old male with headache and hypertension. EXAM: CT HEAD WITHOUT CONTRAST TECHNIQUE: Contiguous axial images were obtained from the base of the skull through the vertex without intravenous contrast. COMPARISON:  Head CT dated 02/05/2018 FINDINGS: Brain: No evidence of acute infarction, hemorrhage, hydrocephalus, extra-axial collection or mass lesion/mass effect. Vascular: No hyperdense vessel or unexpected calcification. Skull: Normal. Negative for fracture or focal lesion. Sinuses/Orbits: No acute finding. Other: None IMPRESSION: Normal noncontrast CT of the brain. Electronically Signed   By: Anner Crete M.D.   On: 03/31/2018 03:04     Assessment/Plan: 32 year old male admitted with hypertensive urgency who has complaints of headache despite improvement in BP.  Head CT reviewed and shows no acute changes.  Neurological examination with some UMN findings.  Will rule out PRES.  Will also attempt some measures to control headache without narcotics.    Recommendations: 1.  Depacon 500mg  IV now tro 15 minutes.  May repeat if no relief in pain. 2.  Compazine 10mg  IV 3.  MRI of the brain without contrast  Alexis Goodell, MD Neurology [phone] 03/31/2018, 5:36 PM

## 2018-03-31 NOTE — H&P (Signed)
[facility] [place] NAME: Jeffrey Holloway    MR#:  [id number]  DATE OF BIRTH:  [birth year]  DATE OF ADMISSION:  03/31/2018  PRIMARY CARE PHYSICIAN: Patient, No Pcp Per   REQUESTING/REFERRING PHYSICIAN: Paulette Blanch, MD  CHIEF COMPLAINT:  No chief complaint on file.  HISTORY OF PRESENT ILLNESS:  Jeffrey Holloway  is a 32 y.o. male with a known history of crescentic GN (ESRD, HD T/T/S), HTN p/w headache, hypertensive urgency. Pt states he had a full hemodialysis session on Saturday 03/29/2018, x3hrs, -1.5L UF. States he typically develops headache post-HD. He developed just such a headache on Sunday (03/30/2018) morning. He states the pain started at the base of the neck, then ran up the posterior and upper head and behind his eyes. This is similar to prior. He checked his BP, systolic ~944. He states he takes all of his medications religiously, but was instructed by his physicians to take extra clonidine and hydralazine in this sort of situation. He attempted to do so, and states the SBP improved to the 180s range for several hours, but headache persisted. He states he rechecked his BP before bed, and SBP was in the 220s range again. He took another dose of clonidine, again w/ transient improvement of SBP to 180s range. He states he went to sleep @~2200-2300PM, but was woken up from sleep shortly after midnight (@~0030AM on Monday 03/31/2018) w/ severe headache, SBP back up to 220. Pt notes facial swelling and leg edema in the mornings. He states he was started on ropinerole ~2wks ago for RLS. He is otherwise w/o complaint. He denies CP, SOB or AP. He denies blurred vision or neurological disturbance. He states he still makes a significant quantity of urine. Started on Cardene gtt in ED.  PAST MEDICAL HISTORY:   Past Medical History:  Diagnosis Date  . Constipation   . Diarrhea   . Glomerulonephritis   . Hemorrhoids   . Kidney infection   . Nausea  and vomiting   . Occult blood in stools   . Shortness of breath   . Sleep apnea     PAST SURGICAL HISTORY:   Past Surgical History:  Procedure Laterality Date  . APPENDECTOMY    . DIALYSIS/PERMA CATHETER INSERTION N/A 10/15/2017   Procedure: DIALYSIS/PERMA CATHETER INSERTION;  Surgeon: Katha Cabal, MD;  Location: [facility];  Service: Cardiovascular;  Laterality: N/A;    SOCIAL HISTORY:   Social History   Tobacco Use  . Smoking status: Current Every Day Smoker    Packs/day: 0.25    Types: Cigarettes  . Smokeless tobacco: Never Used  Substance Use Topics  . Alcohol use: Yes    FAMILY HISTORY:   Family History  Problem Relation Age of Onset  . Varicose Veins Neg Hx   . Vision loss Neg Hx   . Stroke Neg Hx     DRUG ALLERGIES:  No Known Allergies  REVIEW OF SYSTEMS:   Review of Systems  Constitutional: Negative for chills, diaphoresis, fever, malaise/fatigue and weight loss.  HENT: Negative for congestion, ear pain, hearing loss, nosebleeds, sinus pain, sore throat and tinnitus.   Eyes: Negative for blurred vision, double vision and photophobia.  Respiratory: Negative for cough, hemoptysis, sputum production, shortness of breath and wheezing.   Cardiovascular: Positive for leg swelling. Negative for chest pain, palpitations, orthopnea, claudication and PND.  Gastrointestinal: Negative for abdominal pain, blood in stool, constipation, diarrhea, heartburn, melena, nausea  and vomiting.  Genitourinary: Negative for dysuria, frequency, hematuria and urgency.  Musculoskeletal: Negative for back pain, falls, joint pain, myalgias and neck pain.  Skin: Negative for itching and rash.  Neurological: Positive for headaches. Negative for dizziness, tingling, tremors, sensory change, speech change, focal weakness, seizures, loss of consciousness and weakness.  Psychiatric/Behavioral: Negative for memory loss. The patient does not have insomnia.    MEDICATIONS AT  HOME:   Prior to Admission medications   Medication Sig Start Date End Date Taking? Authorizing Provider  amLODipine (NORVASC) 10 MG tablet Take 1 tablet (10 mg total) by mouth daily. 10/18/17  Yes Epifanio Lesches, MD  calcium acetate (PHOSLO) 667 MG capsule Take 2 capsules (1,334 mg total) by mouth 3 (three) times daily with meals. 10/18/17  Yes Epifanio Lesches, MD  calcium carbonate (TUMS - DOSED IN MG ELEMENTAL CALCIUM) 500 MG chewable tablet Chew 1 tablet by mouth 2 (two) times daily.   Yes [provider]  cloNIDine (CATAPRES) 0.1 MG tablet Take 1 tablet (0.1 mg total) by mouth 2 (two) times daily. 10/18/17  Yes Epifanio Lesches, MD  hydrALAZINE (APRESOLINE) 25 MG tablet Take 1 tablet (25 mg total) by mouth 3 (three) times daily. Patient taking differently: Take 50 mg by mouth 3 (three) times daily.  12/30/17 12/30/18 Yes Veronese, [place], MD  losartan (COZAAR) 100 MG tablet Take 1 tablet (100 mg total) by mouth daily. 10/18/17  Yes Epifanio Lesches, MD  multivitamin (RENA-VIT) TABS tablet Take 1 tablet by mouth at bedtime. 10/18/17  Yes Epifanio Lesches, MD  Nutritional Supplements (FEEDING SUPPLEMENT, NEPRO CARB STEADY,) LIQD Take 237 mLs by mouth 2 (two) times daily between meals. 10/18/17  Yes Epifanio Lesches, MD  omeprazole (PRILOSEC) 20 MG capsule Take 20 mg by mouth daily.   Yes [provider]  predniSONE (DELTASONE) 20 MG tablet Take 1.5 tablets (30 mg total) by mouth daily with breakfast. Patient taking differently: Take 20 mg by mouth daily with breakfast.  10/19/17  Yes Epifanio Lesches, MD  rOPINIRole (REQUIP) 0.5 MG tablet Take 1 tablet by mouth at bedtime. 03/21/18  Yes [provider]  cefdinir (OMNICEF) 300 MG capsule Take 1 capsule (300 mg total) by mouth 2 (two) times daily. Patient not taking: Reported on 03/31/2018 11/27/17   Salary, Avel Peace, MD  mupirocin ointment (BACTROBAN) 2 % Place 1 application into the nose 2  (two) times daily. Patient not taking: Reported on 03/31/2018 11/27/17   Salary, Holly Bodily D, MD  ondansetron (ZOFRAN ODT) 4 MG disintegrating tablet Take 1 tablet (4 mg total) by mouth every 8 (eight) hours as needed for nausea or vomiting. Patient not taking: Reported on 03/31/2018 02/05/18   Rudene Re, MD  oxyCODONE (OXY IR/ROXICODONE) 5 MG immediate release tablet Take 1-2 tablets (5-10 mg total) by mouth every 6 (six) hours as needed for moderate pain. Patient not taking: Reported on 11/25/2017 10/18/17   Epifanio Lesches, MD  traMADol (ULTRAM) 50 MG tablet Take 1 tablet (50 mg total) by mouth every 6 (six) hours as needed for moderate pain. Patient not taking: Reported on 03/31/2018 11/27/17   Salary, Holly Bodily D, MD      VITAL SIGNS:  Blood pressure (!) 155/96, pulse 84, temperature 97.8 F (36.6 C), temperature source Oral, resp. rate 20, height 6' (1.829 m), weight 79.7 kg, SpO2 96 %.  PHYSICAL EXAMINATION:  Physical Exam  Constitutional: He is oriented to person, place, and time. He appears well-developed and well-nourished. He is active and cooperative.  Non-toxic  appearance. He does not have a sickly appearance. He does not appear ill. No distress. He is not intubated.  HENT:  Head: Normocephalic and atraumatic.  Mouth/Throat: Oropharynx is clear and moist. No oropharyngeal exudate.  Eyes: Conjunctivae, EOM and lids are normal. No scleral icterus.  Neck: Neck supple. No JVD present. No thyromegaly present.  Cardiovascular: Normal rate, regular rhythm, S1 normal, S2 normal and normal heart sounds.  No extrasystoles are present. Exam reveals no gallop, no S3, no S4, no distant heart sounds and no friction rub.  No murmur heard. Pulmonary/Chest: Effort normal and breath sounds normal. No accessory muscle usage or stridor. No apnea, no tachypnea and no bradypnea. He is not intubated. No respiratory distress. He has no decreased breath sounds. He has no wheezes. He has no rhonchi.  He has no rales.  Abdominal: Soft. Bowel sounds are normal. He exhibits no distension. There is no tenderness. There is no rigidity, no rebound and no guarding.  Musculoskeletal: Normal range of motion. He exhibits no edema or tenderness.  Lymphadenopathy:    He has no cervical adenopathy.  Neurological: He is alert and oriented to person, place, and time. He is not disoriented.  Skin: Skin is warm and dry. No rash noted. He is not diaphoretic. No erythema.  Psychiatric: He has a normal mood and affect. His speech is normal and behavior is normal. Judgment and thought content normal. Cognition and memory are normal.   LABORATORY PANEL:   CBC Recent Labs  Lab 03/31/18 0225  WBC 6.6  HGB 9.0*  HCT 29.4*  PLT 105*   ------------------------------------------------------------------------------------------------------------------  Chemistries  Recent Labs  Lab 03/31/18 0225 03/31/18 0654  NA 144  --   K 3.7  --   CL 105  --   CO2 29  --   GLUCOSE 91  --   BUN 41*  --   CREATININE 8.55*  --   CALCIUM 8.2*  --   MG  --  1.9  AST  --  24  ALT  --  16  ALKPHOS  --  41  BILITOT  --  0.9   ------------------------------------------------------------------------------------------------------------------  Cardiac Enzymes No results for input(s): TROPONINI in the last 168 hours. ------------------------------------------------------------------------------------------------------------------  RADIOLOGY:  Ct Head Wo Contrast  Result Date: 03/31/2018 CLINICAL DATA:  32 year old male with headache and hypertension. EXAM: CT HEAD WITHOUT CONTRAST TECHNIQUE: Contiguous axial images were obtained from the base of the skull through the vertex without intravenous contrast. COMPARISON:  Head CT dated 02/05/2018 FINDINGS: Brain: No evidence of acute infarction, hemorrhage, hydrocephalus, extra-axial collection or mass lesion/mass effect. Vascular: No hyperdense vessel or unexpected  calcification. Skull: Normal. Negative for fracture or focal lesion. Sinuses/Orbits: No acute finding. Other: None IMPRESSION: Normal noncontrast CT of the brain. Electronically Signed   By: Anner Crete M.D.   On: 03/31/2018 03:04   IMPRESSION AND PLAN:   A/P: 32 HA, hypertensive urgency. ESRD (HD T/T/S), hypocalcemia, hyperphosphatemia, macrocytic anemia. Edema. -HA, hypertensive urgency: Pt p/w elevated systolic and diastolic blood pressure readings despite receiving full hemodialysis and taking his routinely scheduled (as well as extra supplemental) medications doses. I am at a loss as to why he would continue to exhibit such severe refractory hypertension in these circumstances. That said, he does not endorse CP, SOB, AP, blurred vision or neurological disturbance. Started on Cardene gtt in ED. Goal SBP reduction 25% x24hrs, goal SBP 150s-160s. Restart home agents. Nephrology consult for assistance w/ antihypertensive management. Alpha blockade (i.e. Minoxidil) may be  an option. Continuous cardiac monitoring. -ESRD, hypocalcemia, hyperphosphatemia: Nephrology consult for HD. Ionized calcium, PTHi, vitamin D. C/w PhosLo, Tums, steroids. -Macrocytic anemia: Hgb 9.0, MCV 100.3. B12, Folate levels pending. Likely superimposed component of anemia of chronic (kidney) disease. No evidence of acute blood loss at present time. -Edema: Endorses facial and leg edema. Still makes urine. Will evaluate for proteinuria. TProt/albumin WNL. -c/w other home meds/formulary subs. -FEN/GI: Renal diet. -DVT PPx: Heparin. -Code status: Full code. -Disposition: Admission, > 2 midnights.   All the records are reviewed and case discussed with ED provider. Management plans discussed with the patient, family and they are in agreement.  CODE STATUS: Full code.  TOTAL TIME TAKING CARE OF THIS PATIENT: 75 minutes.    Arta Silence M.D on 03/31/2018 at 9:01 AM  Between 7am to 6pm - Pager -  [phone]  After 6pm go to [email or web address] - Proofreader  [facility]  Office  [phone]  CC: Primary care physician; Patient, No Pcp Per   Note: This dictation was prepared with Dragon dictation along with smaller phrase technology. Any transcriptional errors that result from this process are unintentional.

## 2018-03-31 NOTE — ED Notes (Signed)
Dr. Sung at bedside.  

## 2018-03-31 NOTE — ED Notes (Signed)
Floor called back to take report on pt.

## 2018-04-01 ENCOUNTER — Inpatient Hospital Stay: Payer: BLUE CROSS/BLUE SHIELD

## 2018-04-01 DIAGNOSIS — R51 Headache: Secondary | ICD-10-CM

## 2018-04-01 DIAGNOSIS — I6783 Posterior reversible encephalopathy syndrome: Secondary | ICD-10-CM

## 2018-04-01 LAB — CBC WITH DIFFERENTIAL/PLATELET
ABS IMMATURE GRANULOCYTES: 0.22 10*3/uL — AB (ref 0.00–0.07)
BASOS PCT: 0 %
Basophils Absolute: 0 10*3/uL (ref 0.0–0.1)
Eosinophils Absolute: 0 10*3/uL (ref 0.0–0.5)
Eosinophils Relative: 0 %
HCT: 32.4 % — ABNORMAL LOW (ref 39.0–52.0)
Hemoglobin: 9.7 g/dL — ABNORMAL LOW (ref 13.0–17.0)
Immature Granulocytes: 2 %
Lymphocytes Relative: 18 %
Lymphs Abs: 1.7 10*3/uL (ref 0.7–4.0)
MCH: 29.9 pg (ref 26.0–34.0)
MCHC: 29.9 g/dL — ABNORMAL LOW (ref 30.0–36.0)
MCV: 100 fL (ref 80.0–100.0)
MONO ABS: 0.8 10*3/uL (ref 0.1–1.0)
Monocytes Relative: 9 %
NEUTROS PCT: 71 %
Neutro Abs: 6.4 10*3/uL (ref 1.7–7.7)
PLATELETS: 120 10*3/uL — AB (ref 150–400)
RBC: 3.24 MIL/uL — AB (ref 4.22–5.81)
RDW: 17.5 % — ABNORMAL HIGH (ref 11.5–15.5)
WBC: 9.1 10*3/uL (ref 4.0–10.5)
nRBC: 0.2 % (ref 0.0–0.2)

## 2018-04-01 LAB — BASIC METABOLIC PANEL
Anion gap: 13 (ref 5–15)
BUN: 52 mg/dL — ABNORMAL HIGH (ref 6–20)
CALCIUM: 8.4 mg/dL — AB (ref 8.9–10.3)
CO2: 28 mmol/L (ref 22–32)
CREATININE: 10.45 mg/dL — AB (ref 0.61–1.24)
Chloride: 102 mmol/L (ref 98–111)
GFR calc Af Amer: 7 mL/min — ABNORMAL LOW (ref >60)
GFR, EST NON AFRICAN AMERICAN: 6 mL/min — AB (ref >60)
Glucose, Bld: 85 mg/dL (ref 70–99)
POTASSIUM: 4.4 mmol/L (ref 3.5–5.1)
SODIUM: 143 mmol/L (ref 135–145)

## 2018-04-01 LAB — CALCIUM, IONIZED: Calcium, Ionized, Serum: 4.6 mg/dL (ref 4.5–5.6)

## 2018-04-01 LAB — UREA NITROGEN, URINE: Urea Nitrogen, Ur: 155 mg/dL

## 2018-04-01 LAB — VITAMIN D 25 HYDROXY (VIT D DEFICIENCY, FRACTURES): VIT D 25 HYDROXY: 33.8 ng/mL (ref 30.0–100.0)

## 2018-04-01 LAB — GLUCOSE, CAPILLARY: GLUCOSE-CAPILLARY: 111 mg/dL — AB (ref 70–99)

## 2018-04-01 LAB — PARATHYROID HORMONE, INTACT (NO CA): PTH: 108 pg/mL — ABNORMAL HIGH (ref 15–65)

## 2018-04-01 MED ORDER — HYDRALAZINE HCL 50 MG PO TABS
75.0000 mg | ORAL_TABLET | Freq: Three times a day (TID) | ORAL | Status: DC
  Administered 2018-04-01 (×2): 75 mg via ORAL
  Filled 2018-04-01 (×2): qty 2

## 2018-04-01 MED ORDER — OXYCODONE-ACETAMINOPHEN 5-325 MG PO TABS
1.0000 | ORAL_TABLET | Freq: Four times a day (QID) | ORAL | Status: DC | PRN
  Administered 2018-04-01 – 2018-04-02 (×5): 1 via ORAL
  Filled 2018-04-01 (×6): qty 1

## 2018-04-01 MED ORDER — CITALOPRAM HYDROBROMIDE 20 MG PO TABS
10.0000 mg | ORAL_TABLET | Freq: Every day | ORAL | Status: DC
  Administered 2018-04-01 – 2018-04-04 (×4): 10 mg via ORAL
  Filled 2018-04-01 (×4): qty 1

## 2018-04-01 MED ORDER — GENTAMICIN SULFATE 0.1 % EX CREA
TOPICAL_CREAM | Freq: Every day | CUTANEOUS | Status: DC
  Administered 2018-04-01 – 2018-04-03 (×3): via TOPICAL
  Filled 2018-04-01: qty 15

## 2018-04-01 MED ORDER — CLONIDINE HCL 0.1 MG PO TABS
0.2000 mg | ORAL_TABLET | Freq: Two times a day (BID) | ORAL | Status: DC
  Administered 2018-04-01: 0.2 mg via ORAL
  Filled 2018-04-01: qty 2

## 2018-04-01 MED ORDER — PREDNISONE 20 MG PO TABS
20.0000 mg | ORAL_TABLET | Freq: Once | ORAL | Status: AC
  Administered 2018-04-01: 20 mg via ORAL
  Filled 2018-04-01: qty 1

## 2018-04-01 MED ORDER — AMLODIPINE BESYLATE 10 MG PO TABS: 10.0000 mg | ORAL_TABLET | Freq: Once | ORAL | Status: DC

## 2018-04-01 MED ORDER — METOPROLOL TARTRATE 50 MG PO TABS
50.0000 mg | ORAL_TABLET | Freq: Two times a day (BID) | ORAL | Status: DC
  Administered 2018-04-01 – 2018-04-04 (×7): 50 mg via ORAL
  Filled 2018-04-01 (×7): qty 1

## 2018-04-01 MED ORDER — AMLODIPINE BESYLATE 10 MG PO TABS
10.0000 mg | ORAL_TABLET | Freq: Once | ORAL | Status: AC
  Administered 2018-04-01: 10 mg via ORAL
  Filled 2018-04-01: qty 1

## 2018-04-01 MED ORDER — MORPHINE SULFATE (PF) 2 MG/ML IV SOLN
INTRAVENOUS | Status: AC
  Administered 2018-04-01: 2 mg via INTRAVENOUS
  Filled 2018-04-01: qty 1

## 2018-04-01 MED ORDER — MORPHINE SULFATE (PF) 2 MG/ML IV SOLN
2.0000 mg | Freq: Once | INTRAVENOUS | Status: AC
  Administered 2018-04-01: 2 mg via INTRAVENOUS

## 2018-04-01 MED ORDER — PREDNISONE 20 MG PO TABS: 20.0000 mg | ORAL_TABLET | Freq: Once | ORAL | Status: DC

## 2018-04-01 MED ORDER — METOPROLOL TARTRATE 50 MG PO TABS: 50.0000 mg | ORAL_TABLET | Freq: Two times a day (BID) | ORAL | Status: DC

## 2018-04-01 NOTE — Progress Notes (Signed)
Name: Jeffrey Holloway MRN: [id number] DOB: [birth year]     CONSULTATION DATE: 03/31/2018 Subjective & objectives: Remains on a Cardene drip  PAST MEDICAL HISTORY :   has a past medical history of Constipation, Diarrhea, Glomerulonephritis, Hemorrhoids, Kidney infection, Nausea and vomiting, Occult blood in stools, Shortness of breath, and Sleep apnea.  has a past surgical history that includes Appendectomy and DIALYSIS/PERMA CATHETER INSERTION (N/A, 10/15/2017). Prior to Admission medications   Medication Sig Start Date End Date Taking? Authorizing Provider  amLODipine (NORVASC) 10 MG tablet Take 1 tablet (10 mg total) by mouth daily. 10/18/17  Yes Epifanio Lesches, MD  calcium acetate (PHOSLO) 667 MG capsule Take 2 capsules (1,334 mg total) by mouth 3 (three) times daily with meals. 10/18/17  Yes Epifanio Lesches, MD  calcium carbonate (TUMS - DOSED IN MG ELEMENTAL CALCIUM) 500 MG chewable tablet Chew 1 tablet by mouth 2 (two) times daily.   Yes [provider]  cloNIDine (CATAPRES) 0.1 MG tablet Take 1 tablet (0.1 mg total) by mouth 2 (two) times daily. 10/18/17  Yes Epifanio Lesches, MD  hydrALAZINE (APRESOLINE) 25 MG tablet Take 1 tablet (25 mg total) by mouth 3 (three) times daily. Patient taking differently: Take 50 mg by mouth 3 (three) times daily.  12/30/17 12/30/18 Yes Veronese, [place], MD  losartan (COZAAR) 100 MG tablet Take 1 tablet (100 mg total) by mouth daily. 10/18/17  Yes Epifanio Lesches, MD  multivitamin (RENA-VIT) TABS tablet Take 1 tablet by mouth at bedtime. 10/18/17  Yes Epifanio Lesches, MD  Nutritional Supplements (FEEDING SUPPLEMENT, NEPRO CARB STEADY,) LIQD Take 237 mLs by mouth 2 (two) times daily between meals. 10/18/17  Yes Epifanio Lesches, MD  omeprazole (PRILOSEC) 20 MG capsule Take 20 mg by mouth daily.   Yes [provider]  predniSONE (DELTASONE) 20 MG tablet Take 1.5 tablets (30 mg total) by mouth daily with  breakfast. Patient taking differently: Take 20 mg by mouth daily with breakfast.  10/19/17  Yes Epifanio Lesches, MD  rOPINIRole (REQUIP) 0.5 MG tablet Take 1 tablet by mouth at bedtime. 03/21/18  Yes [provider]  cefdinir (OMNICEF) 300 MG capsule Take 1 capsule (300 mg total) by mouth 2 (two) times daily. Patient not taking: Reported on 03/31/2018 11/27/17   Salary, Avel Peace, MD  mupirocin ointment (BACTROBAN) 2 % Place 1 application into the nose 2 (two) times daily. Patient not taking: Reported on 03/31/2018 11/27/17   Salary, Holly Bodily D, MD  ondansetron (ZOFRAN ODT) 4 MG disintegrating tablet Take 1 tablet (4 mg total) by mouth every 8 (eight) hours as needed for nausea or vomiting. Patient not taking: Reported on 03/31/2018 02/05/18   Rudene Re, MD  oxyCODONE (OXY IR/ROXICODONE) 5 MG immediate release tablet Take 1-2 tablets (5-10 mg total) by mouth every 6 (six) hours as needed for moderate pain. Patient not taking: Reported on 11/25/2017 10/18/17   Epifanio Lesches, MD  traMADol (ULTRAM) 50 MG tablet Take 1 tablet (50 mg total) by mouth every 6 (six) hours as needed for moderate pain. Patient not taking: Reported on 03/31/2018 11/27/17   Salary, Avel Peace, MD   No Known Allergies  FAMILY HISTORY:  family history is not on file. SOCIAL HISTORY:  reports that he has been smoking cigarettes. He has been smoking about 0.25 packs per day. He has never used smokeless tobacco. He reports that he drinks alcohol. He reports that he has current or past drug history. Drugs: Marijuana and Cocaine.  REVIEW OF SYSTEMS:  Unable to obtain due to critical illness   VITAL SIGNS: Temp:  [97.5 F (36.4 C)-98.4 F (36.9 C)] 97.5 F (36.4 C) (10/29 1018) Pulse Rate:  [62-106] 62 (10/29 1200) Resp:  [3-28] 14 (10/29 1200) BP: (130-177)/(80-120) 160/96 (10/29 1200) SpO2:  [89 %-98 %] 94 % (10/29 1200)   Physical Examination:  Awake and oriented with no focal neurological  deficits On room air, no distress, able to talk in full sentences, bilateral equal air entry with no adventitious sounds S1 and S2 are audible with no murmur Benign abdominal exam No peripheral edema    ASSESSMENT / PLAN:  Hypertensive emergency with headaches (PRES syndrome).  No acute intracranial abnormalities on CT head -Optimize antihypertensives and monitor hemodynamics -Follow with neurology evaluation and MRI as the patient continues to have headache despite having blood pressure better controlled.  End-stage renal disease on HD as per renal.  History of glomerular nephritis and has been in the steroids of prednisone 20 mg p.o. once a day  GERD -PPI  Anemia -Keep hemoglobin more than 7 g/dL  Thrombocytopenia -Monitor platelet count  Full code  Supportive care  Critical care time 35 minutes

## 2018-04-01 NOTE — Progress Notes (Signed)
HD Treatment Complete    04/01/18 1605  Vital Signs  Pulse Rate 76  Resp (!) 8  Oxygen Therapy  SpO2 96 %  O2 Device Room Air  During Hemodialysis Assessment  Blood Flow Rate (mL/min) 400 mL/min  Arterial Pressure (mmHg) -200 mmHg  Venous Pressure (mmHg) 190 mmHg  Transmembrane Pressure (mmHg) 70 mmHg  Ultrafiltration Rate (mL/min) 1320 mL/min  Dialysate Flow Rate (mL/min) 600 ml/min  Conductivity: Machine  14.2  HD Safety Checks Performed Yes  Intra-Hemodialysis Comments Tolerated well;Tx completed (HD Treatment Complete)

## 2018-04-01 NOTE — Progress Notes (Signed)
RN made Dr. Juleen China aware that patient took some of his medications and then vomited.  Patient stated it was because of the prednisone taste.  Patient possibly vomited norvasc, coreg and prednisone.  Dr. Juleen China stated that he wanted the patient to have another dose of norvasc and prednisone and to order it.

## 2018-04-01 NOTE — Progress Notes (Signed)
Notified MD, patient requested a decreased run time by 15 minutes. MD aware and states ok to adjust time by 15 minutes.

## 2018-04-01 NOTE — Progress Notes (Signed)
Dr. Juleen China present and gave order for metoprolol 50 mg BID and to discontinue coreg.

## 2018-04-01 NOTE — Progress Notes (Signed)
Subjective: Patient reports that his headache is completely resolved.  He is doing better and feels back to baseline.    Objective: Current vital signs: BP (!) 154/103   Pulse 68   Temp 97.8 F (36.6 C) (Axillary)   Resp 14   Ht 6' (1.829 m)   Wt 79.7 kg   SpO2 95%   BMI 23.83 kg/m  Vital signs in last 24 hours: Temp:  [97.5 F (36.4 C)-98.4 F (36.9 C)] 97.8 F (36.6 C) (10/29 0400) Pulse Rate:  [57-94] 68 (10/29 0800) Resp:  [3-28] 14 (10/29 0800) BP: (130-177)/(79-112) 154/103 (10/29 0800) SpO2:  [89 %-98 %] 95 % (10/29 0800)  Intake/Output from previous day: 10/28 0701 - 10/29 0700 In: 1266.2 [I.V.:1160.4; IV Piggyback:105.7] Out: 325 [Urine:325] Intake/Output this shift: Total I/O In: 15 [I.V.:15] Out: -  Nutritional status:  Diet Order            Diet renal with fluid restriction Fluid restriction: 1200 mL Fluid; Room service appropriate? Yes; Fluid consistency: Thin  Diet effective now              Neurologic Exam:  Mental Status: Alert, oriented, thought content appropriate.  Speech fluent without evidence of aphasia.  Able to follow 3 step commands without difficulty. Cranial Nerves: II: Discs flat bilaterally; Visual fields grossly normal, pupils equal, round, reactive to light and accommodation III,IV, VI: ptosis not present, extra-ocular motions intact bilaterally V,VII: smile symmetric, facial light touch sensation normal bilaterally VIII: hearing normal bilaterally IX,X: gag reflex present XI: bilateral shoulder shrug XII: midline tongue extension Motor: 5/5 throughout Sensory: Pinprick and light touch intact throughout, bilaterally Deep Tendon Reflexes: 2+ in the upper extremities, trace at the knees and absent at the ankles bilaterally Plantars: Right: upgoing                         Left: upgoing Cerebellar: normal finger-to-nose and normal heel-to-shin testing bilaterally   Lab Results: Basic Metabolic Panel: Recent Labs  Lab  03/31/18 0225 03/31/18 0654 04/01/18 0634  NA 144  --  143  K 3.7  --  4.4  CL 105  --  102  CO2 29  --  28  GLUCOSE 91  --  85  BUN 41*  --  52*  CREATININE 8.55*  --  10.45*  CALCIUM 8.2*  --  8.4*  MG  --  1.9  --   PHOS  --  5.4*  --     Liver Function Tests: Recent Labs  Lab 03/31/18 0654  AST 24  ALT 16  ALKPHOS 41  BILITOT 0.9  PROT 6.7  ALBUMIN 3.9   No results for input(s): LIPASE, AMYLASE in the last 168 hours. No results for input(s): AMMONIA in the last 168 hours.  CBC: Recent Labs  Lab 03/31/18 0225 04/01/18 0634  WBC 6.6 9.1  NEUTROABS 4.4 6.4  HGB 9.0* 9.7*  HCT 29.4* 32.4*  MCV 100.3* 100.0  PLT 105* 120*    Cardiac Enzymes: No results for input(s): CKTOTAL, CKMB, CKMBINDEX, TROPONINI in the last 168 hours.  Lipid Panel: No results for input(s): CHOL, TRIG, HDL, CHOLHDL, VLDL, LDLCALC in the last 168 hours.  CBG: Recent Labs  Lab 03/31/18 0806  GLUCAP 4    Microbiology: Results for orders placed or performed during the hospital encounter of 03/31/18  MRSA PCR Screening     Status: None   Collection Time: 03/31/18  8:11 AM  Result Value  Ref Range Status   MRSA by PCR NEGATIVE NEGATIVE Final    Comment:        The GeneXpert MRSA Assay (FDA approved for NASAL specimens only), is one component of a comprehensive MRSA colonization surveillance program. It is not intended to diagnose MRSA infection nor to guide or monitor treatment for MRSA infections. Performed at [facility], [address] [place], [place] [address]     Coagulation Studies: No results for input(s): LABPROT, INR in the last 72 hours.  Imaging: Ct Head Wo Contrast  Result Date: 03/31/2018 CLINICAL DATA:  32 year old male with headache and hypertension. EXAM: CT HEAD WITHOUT CONTRAST TECHNIQUE: Contiguous axial images were obtained from the base of the skull through the vertex without intravenous contrast. COMPARISON:  Head CT dated 02/05/2018  FINDINGS: Brain: No evidence of acute infarction, hemorrhage, hydrocephalus, extra-axial collection or mass lesion/mass effect. Vascular: No hyperdense vessel or unexpected calcification. Skull: Normal. Negative for fracture or focal lesion. Sinuses/Orbits: No acute finding. Other: None IMPRESSION: Normal noncontrast CT of the brain. Electronically Signed   By: Anner Crete M.D.   On: 03/31/2018 03:04    Medications:  I have reviewed the patient's current medications. Prior to Admission:  Medications Prior to Admission  Medication Sig Dispense Refill Last Dose  . amLODipine (NORVASC) 10 MG tablet Take 1 tablet (10 mg total) by mouth daily. 30 tablet 0 03/30/2018 at Unknown time  . calcium acetate (PHOSLO) 667 MG capsule Take 2 capsules (1,334 mg total) by mouth 3 (three) times daily with meals. 60 capsule 0 03/30/2018 at Unknown time  . calcium carbonate (TUMS - DOSED IN MG ELEMENTAL CALCIUM) 500 MG chewable tablet Chew 1 tablet by mouth 2 (two) times daily.   prn at prn  . cloNIDine (CATAPRES) 0.1 MG tablet Take 1 tablet (0.1 mg total) by mouth 2 (two) times daily. 60 tablet 1 03/30/2018 at Unknown time  . hydrALAZINE (APRESOLINE) 25 MG tablet Take 1 tablet (25 mg total) by mouth 3 (three) times daily. (Patient taking differently: Take 50 mg by mouth 3 (three) times daily. ) 90 tablet 1 03/30/2018 at Unknown time  . losartan (COZAAR) 100 MG tablet Take 1 tablet (100 mg total) by mouth daily. 30 tablet 0 03/30/2018 at Unknown time  . multivitamin (RENA-VIT) TABS tablet Take 1 tablet by mouth at bedtime. 30 tablet 0 03/30/2018 at Unknown time  . Nutritional Supplements (FEEDING SUPPLEMENT, NEPRO CARB STEADY,) LIQD Take 237 mLs by mouth 2 (two) times daily between meals. 30 Can 0 Past Week at Unknown time  . omeprazole (PRILOSEC) 20 MG capsule Take 20 mg by mouth daily.   03/30/2018 at Unknown time  . predniSONE (DELTASONE) 20 MG tablet Take 1.5 tablets (30 mg total) by mouth daily with breakfast.  (Patient taking differently: Take 20 mg by mouth daily with breakfast. ) 30 tablet 0 03/30/2018 at Unknown time  . rOPINIRole (REQUIP) 0.5 MG tablet Take 1 tablet by mouth at bedtime.  6 Past Week at Unknown time  . cefdinir (OMNICEF) 300 MG capsule Take 1 capsule (300 mg total) by mouth 2 (two) times daily. (Patient not taking: Reported on 03/31/2018) 10 capsule 0 Not Taking at Unknown time  . mupirocin ointment (BACTROBAN) 2 % Place 1 application into the nose 2 (two) times daily. (Patient not taking: Reported on 03/31/2018) 22 g 0 Completed Course at Unknown time  . ondansetron (ZOFRAN ODT) 4 MG disintegrating tablet Take 1 tablet (4 mg total) by mouth every 8 (  eight) hours as needed for nausea or vomiting. (Patient not taking: Reported on 03/31/2018) 20 tablet 0 Not Taking at Unknown time  . oxyCODONE (OXY IR/ROXICODONE) 5 MG immediate release tablet Take 1-2 tablets (5-10 mg total) by mouth every 6 (six) hours as needed for moderate pain. (Patient not taking: Reported on 11/25/2017) 30 tablet 0 Not Taking at Unknown time  . traMADol (ULTRAM) 50 MG tablet Take 1 tablet (50 mg total) by mouth every 6 (six) hours as needed for moderate pain. (Patient not taking: Reported on 03/31/2018) 10 tablet 0 Not Taking at Unknown time   Scheduled: . amLODipine  10 mg Oral Daily  . calcium acetate  1,334 mg Oral TID WC  . carvedilol  6.25 mg Oral BID WC  . Chlorhexidine Gluconate Cloth  6 each Topical Q0600  . citalopram  10 mg Oral Daily  . cloNIDine  0.1 mg Oral BID  . feeding supplement (NEPRO CARB STEADY)  237 mL Oral BID BM  . heparin  5,000 Units Subcutaneous Q8H  . hydrALAZINE  50 mg Oral TID  . losartan  100 mg Oral Daily  . multivitamin  1 tablet Oral QHS  . pantoprazole  40 mg Oral Daily  . predniSONE  20 mg Oral Q breakfast  . rOPINIRole  0.5 mg Oral QHS   Patient seen and examined.  Clinical course and management discussed.  Necessary edits performed.  I agree with the above.  Assessment and  plan of care developed and discussed below.    Assessments: Patient reports that he is back to baseline.  No current complaints of headache.    Recommendations: 1. MRI of the brain without contrast pending   This patient was staffed with Dr. Magda Paganini, Doy Mince who personally evaluated patient, reviewed documentation and agreed with assessment and plan of care as above.  Rufina Falco, DNP, FNP-BC Board certified Nurse Practitioner Neurology Department   LOS: 1 day   04/01/2018  9:12 AM  Alexis Goodell, MD Neurology [phone]  04/01/2018  1:45 PM

## 2018-04-01 NOTE — Progress Notes (Signed)
Patient ID: Jeffrey Holloway, male   DOB: 30-Jan-1986, 32 y.o.   MRN: [id number]   [employer] PROGRESS NOTE  Jeffrey Holloway HEN:[id number] DOB: Jul 21, 1985 DOA: 03/31/2018 PCP: Patient, No Pcp Per  HPI/Subjective: Patient feeling better.  Headache is gone.  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  Currently receiving dialysis when I saw him.  Objective: Vitals:   04/01/18 1415 04/01/18 1430  BP: (!) 160/103 (!) 156/101  Pulse: 73 73  Resp: 14 13  Temp:    SpO2: 92% 94%    Filed Weights   03/31/18 0147 03/31/18 0800 04/01/18 1245  Weight: 78 kg 79.7 kg 81.9 kg    ROS: Review of Systems  Constitutional: Negative for chills and fever.  Eyes: Negative for blurred vision.  Respiratory: Negative for cough and shortness of breath.   Cardiovascular: Negative for chest pain.  Gastrointestinal: Negative for abdominal pain, constipation, diarrhea, nausea and vomiting.  Genitourinary: Negative for dysuria.  Musculoskeletal: Negative for joint pain.  Neurological: Negative for dizziness and headaches.   Exam: Physical Exam  Constitutional: He is oriented to person, place, and time.  HENT:  Nose: No mucosal edema.  Mouth/Throat: No oropharyngeal exudate or posterior oropharyngeal edema.  Eyes: Pupils are equal, round, and reactive to light. Conjunctivae, EOM and lids are normal.  Neck: No JVD present. Carotid bruit is not present. No edema present. No thyroid mass and no thyromegaly present.  Cardiovascular: S1 normal and S2 normal. Exam reveals no gallop.  No murmur heard. Pulses:      Dorsalis pedis pulses are 2+ on the right side, and 2+ on the left side.  Respiratory: No respiratory distress. He has no wheezes. He has no rhonchi. He has no rales.  GI: Soft. Bowel sounds are normal. There is no tenderness.  Musculoskeletal:       Right ankle: He exhibits no swelling.       Left ankle: He exhibits no swelling.   Lymphadenopathy:    He has no cervical adenopathy.  Neurological: He is alert and oriented to person, place, and time. No cranial nerve deficit.  Skin: Skin is warm. No rash noted. Nails show no clubbing.  Psychiatric: He has a normal mood and affect.      Data Reviewed: Basic Metabolic Panel: Recent Labs  Lab 03/31/18 0225 03/31/18 0654 04/01/18 0634  NA 144  --  143  K 3.7  --  4.4  CL 105  --  102  CO2 29  --  28  GLUCOSE 91  --  85  BUN 41*  --  52*  CREATININE 8.55*  --  10.45*  CALCIUM 8.2*  --  8.4*  MG  --  1.9  --   PHOS  --  5.4*  --    Liver Function Tests: Recent Labs  Lab 03/31/18 0654  AST 24  ALT 16  ALKPHOS 41  BILITOT 0.9  PROT 6.7  ALBUMIN 3.9   CBC: Recent Labs  Lab 03/31/18 0225 04/01/18 0634  WBC 6.6 9.1  NEUTROABS 4.4 6.4  HGB 9.0* 9.7*  HCT 29.4* 32.4*  MCV 100.3* 100.0  PLT 105* 120*    CBG: Recent Labs  Lab 03/31/18 0806  GLUCAP 78    Recent Results (from the past 240 hour(s))  MRSA PCR Screening     Status: None   Collection Time: 03/31/18  8:11 AM  Result Value Ref  Range Status   MRSA by PCR NEGATIVE NEGATIVE Final    Comment:        The GeneXpert MRSA Assay (FDA approved for NASAL specimens only), is one component of a comprehensive MRSA colonization surveillance program. It is not intended to diagnose MRSA infection nor to guide or monitor treatment for MRSA infections. Performed at [facility], [address] [place], [place] [address]      Studies: Ct Head Wo Contrast  Result Date: 03/31/2018 CLINICAL DATA:  32 year old male with headache and hypertension. EXAM: CT HEAD WITHOUT CONTRAST TECHNIQUE: Contiguous axial images were obtained from the base of the skull through the vertex without intravenous contrast. COMPARISON:  Head CT dated 02/05/2018 FINDINGS: Brain: No evidence of acute infarction, hemorrhage, hydrocephalus, extra-axial collection or mass lesion/mass effect. Vascular: No  hyperdense vessel or unexpected calcification. Skull: Normal. Negative for fracture or focal lesion. Sinuses/Orbits: No acute finding. Other: None IMPRESSION: Normal noncontrast CT of the brain. Electronically Signed   By: Anner Crete M.D.   On: 03/31/2018 03:04    Scheduled Meds: . amLODipine  10 mg Oral Daily  . calcium acetate  1,334 mg Oral TID WC  . Chlorhexidine Gluconate Cloth  6 each Topical Q0600  . citalopram  10 mg Oral Daily  . cloNIDine  0.2 mg Oral BID  . feeding supplement (NEPRO CARB STEADY)  237 mL Oral BID BM  . gentamicin cream   Topical Daily  . heparin  5,000 Units Subcutaneous Q8H  . hydrALAZINE  75 mg Oral TID  . losartan  100 mg Oral Daily  . metoprolol tartrate  50 mg Oral BID  . multivitamin  1 tablet Oral QHS  . pantoprazole  40 mg Oral Daily  . predniSONE  20 mg Oral Q breakfast  . rOPINIRole  0.5 mg Oral QHS   Continuous Infusions: . niCARDipine 2.5 mg/hr (04/01/18 1341)    Assessment/Plan:  1. Malignant hypertension.  Critical care specialist to try to taper off nicardipine.  Will need CCU stepdown while on the nicardipine drip.  Titrating his oral medicines amlodipine, metoprolol, clonidine, hydralazine and losartan. 2. Headache.  Hopefully secondary to accelerated hypertension hopefully this will settle down shortly.  Neurology ordered an MRI of the brain. 3. History of vasculitis.  Nephrology increased his prednisone to 20 mg daily 4. Polysubstance abuse.  Cannabis in this urine toxicology. 5. End-stage renal disease on dialysis Tuesday Thursday Saturday 6. Anxiety depression.  Started low-dose Celexa and as needed Xanax. 7. Anemia of chronic disease 8. Thrombocytopenia.  Previous hepatitis C was negative.  Code Status:     Code Status Orders  (From admission, onward)         Start     Ordered   03/31/18 0806  Full code  Continuous     03/31/18 0805        Code Status History    Date Active Date Inactive Code Status Order ID  Comments User Context   11/25/2017 2159 11/27/2017 2231 Full Code [id number]  Jeffrey Mango, MD Inpatient   10/10/2017 1338 10/18/2017 2059 Full Code [id number]  Jeffrey Silence, MD ED     Family Communication: Wife yesterday Disposition Plan: Blood pressure will need to be stable off nicardipine drip prior to disposition  Consultants:  Critical care specialist  Neurology  Nephrology  Time spent: 25 minutes  [place]

## 2018-04-01 NOTE — Progress Notes (Signed)
[facility]  ROUNDING NOTE   Subjective:   Wife at bedside.  Nicardipine gtt  Vomited prednisone and amlodipine this morning.   Reports no more headache.   Hemodialysis for later today.   Objective:  Vital signs in last 24 hours:  Temp:  [97.5 F (36.4 C)-98.4 F (36.9 C)] 97.8 F (36.6 C) (10/29 0400) Pulse Rate:  [57-94] 68 (10/29 0800) Resp:  [3-28] 14 (10/29 0800) BP: (130-177)/(79-112) 154/103 (10/29 0800) SpO2:  [89 %-98 %] 95 % (10/29 0800)  Weight change: 1.7 kg Filed Weights   03/31/18 0147 03/31/18 0800  Weight: 78 kg 79.7 kg    Intake/Output: I/O last 3 completed shifts: In: 1266.2 [I.V.:1160.4; IV Piggyback:105.7] Out: 325 [Urine:325]   Intake/Output this shift:  Total I/O In: 15 [I.V.:15] Out: -   Physical Exam: General: NAD, laying in bed  Head: Normocephalic, atraumatic. Moist oral mucosal membranes  Eyes: Anicteric, PERRL  Neck: Supple, trachea midline  Lungs:  Clear to auscultation  Heart: Regular rate and rhythm  Abdomen:  Soft, nontender,   Extremities:  1+ peripheral edema.  Neurologic: Nonfocal, moving all four extremities  Skin: No lesions  Access: RIJ permcath, PD catheter    Basic Metabolic Panel: Recent Labs  Lab 03/31/18 0225 03/31/18 0654 04/01/18 0634  NA 144  --  143  K 3.7  --  4.4  CL 105  --  102  CO2 29  --  28  GLUCOSE 91  --  85  BUN 41*  --  52*  CREATININE 8.55*  --  10.45*  CALCIUM 8.2*  --  8.4*  MG  --  1.9  --   PHOS  --  5.4*  --     Liver Function Tests: Recent Labs  Lab 03/31/18 0654  AST 24  ALT 16  ALKPHOS 41  BILITOT 0.9  PROT 6.7  ALBUMIN 3.9   No results for input(s): LIPASE, AMYLASE in the last 168 hours. No results for input(s): AMMONIA in the last 168 hours.  CBC: Recent Labs  Lab 03/31/18 0225 04/01/18 0634  WBC 6.6 9.1  NEUTROABS 4.4 6.4  HGB 9.0* 9.7*  HCT 29.4* 32.4*  MCV 100.3* 100.0  PLT 105* 120*    Cardiac Enzymes: No results for input(s):  CKTOTAL, CKMB, CKMBINDEX, TROPONINI in the last 168 hours.  BNP: Invalid input(s): POCBNP  CBG: Recent Labs  Lab 03/31/18 0806  GLUCAP 18    Microbiology: Results for orders placed or performed during the hospital encounter of 03/31/18  MRSA PCR Screening     Status: None   Collection Time: 03/31/18  8:11 AM  Result Value Ref Range Status   MRSA by PCR NEGATIVE NEGATIVE Final    Comment:        The GeneXpert MRSA Assay (FDA approved for NASAL specimens only), is one component of a comprehensive MRSA colonization surveillance program. It is not intended to diagnose MRSA infection nor to guide or monitor treatment for MRSA infections. Performed at [facility], [address] [place], [place] [address]     Coagulation Studies: No results for input(s): LABPROT, INR in the last 72 hours.  Urinalysis: No results for input(s): COLORURINE, LABSPEC, PHURINE, GLUCOSEU, HGBUR, BILIRUBINUR, KETONESUR, PROTEINUR, UROBILINOGEN, NITRITE, LEUKOCYTESUR in the last 72 hours.  Invalid input(s): APPERANCEUR    Imaging: Ct Head Wo Contrast  Result Date: 03/31/2018 CLINICAL DATA:  32 year old male with headache and hypertension. EXAM: CT HEAD WITHOUT CONTRAST TECHNIQUE: Contiguous axial images were obtained  from the base of the skull through the vertex without intravenous contrast. COMPARISON:  Head CT dated 02/05/2018 FINDINGS: Brain: No evidence of acute infarction, hemorrhage, hydrocephalus, extra-axial collection or mass lesion/mass effect. Vascular: No hyperdense vessel or unexpected calcification. Skull: Normal. Negative for fracture or focal lesion. Sinuses/Orbits: No acute finding. Other: None IMPRESSION: Normal noncontrast CT of the brain. Electronically Signed   By: Anner Crete M.D.   On: 03/31/2018 03:04     Medications:   . niCARDipine 1.5 mg/hr (04/01/18 0800)   . amLODipine  10 mg Oral Daily  . calcium acetate  1,334 mg Oral TID WC  . carvedilol  6.25  mg Oral BID WC  . Chlorhexidine Gluconate Cloth  6 each Topical Q0600  . citalopram  10 mg Oral Daily  . cloNIDine  0.1 mg Oral BID  . feeding supplement (NEPRO CARB STEADY)  237 mL Oral BID BM  . heparin  5,000 Units Subcutaneous Q8H  . hydrALAZINE  50 mg Oral TID  . losartan  100 mg Oral Daily  . multivitamin  1 tablet Oral QHS  . pantoprazole  40 mg Oral Daily  . predniSONE  20 mg Oral Q breakfast  . rOPINIRole  0.5 mg Oral QHS   acetaminophen **OR** acetaminophen, ALPRAZolam, bisacodyl, calcium carbonate, ondansetron **OR** ondansetron (ZOFRAN) IV, senna-docusate, traZODone  Assessment/ Plan:  Mr. ZEBULIN SIEGEL is a 32 y.o. white male with end stage renal disease on hemodialysis, hypertension, crescentic glomerulonephritis, who was admitted to [facility] on 10/28 for hypertension.   TTS [facility] [place] RIJ permcath 79 kg  1. 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 Biopsy on 10/14/17. 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  UDS negative for illicit drugs on this admission - Continue TTS schedule. Hemodialysis for later today. Challenge with ultrafiltration to help with volume and hypertension.  - Clean PD catheter and apply gentimicin - Discussed renal transplant - Continue prednisone 20mg  daily -readminister.   2. Vasculitis: urine drug screen negative for cocaine which may exacerbate vasculitis - Increased prednisone to 20mg  daily.   3. Hypertension: urgency. Placed on nicardipine gtt.  Home regimen of clonidine 0.49mcg bid, carvedilol 6.25mg  bid, hydralazine 50mg  tid, losartan 100mg  daily, amlodipine 10mg  daily - resumed home regimen  - Change carvedilol to metoprolol. Patient educated to avoid cocaine with this agent.  - consider increasing dose of hydralazine or clonidine   4. Anemia of chronic kidney disease: hemoglobin 9.7. Macrocytic. With thrombocytopenia.  - Hold EPO due to hypertension  5. Secondary  Hyperparathyroidism with hyperphosphatemia. outpatient PTH 259.  Calcium at goal.  - calcium acetate 3 tabs with meals.    LOS: 1 Hoorain Kozakiewicz 10/29/20198:20 AM

## 2018-04-01 NOTE — Progress Notes (Addendum)
Post HD Treatment  Pt tolerated treatment well. He did not meet his goal of 3 Liters. His net UF was 2767 and BVP was 69.9. MD aware treatment shortened per pt request. All blood returned to patient. Patient's family at the bedside.     04/01/18 1615  Vital Signs  Temp 99.4 F (37.4 C)  Temp Source Oral  Pulse Rate 72  Pulse Rate Source Monitor  Resp 10  BP (!) 144/91  BP Location Left Arm  BP Method Automatic  Patient Position (if appropriate) Lying  Oxygen Therapy  SpO2 95 %  O2 Device Room Air  Pain Assessment  Pain Scale 0-10  Pain Score 0  Dialysis Weight  Weight 79.4 kg  Type of Weight Post-Dialysis  Post-Hemodialysis Assessment  Rinseback Volume (mL) 250 mL  KECN 263 V  Dialyzer Clearance Lightly streaked  Duration of HD Treatment -hour(s) 3.25 hour(s)  Hemodialysis Intake (mL) 500 mL  UF Total -Machine (mL) 3267 mL  Net UF (mL) 2767 mL  Tolerated HD Treatment Yes  Post-Hemodialysis Comments Pt tolerated trreatment well  Hemodialysis Catheter Right Subclavian  Placement Date/Time: 10/15/17 1612   Time Out: Correct patient;Correct site;Correct procedure  Maximum sterile barrier precautions: Hand hygiene;Cap;Mask;Sterile gown;Sterile gloves;Large sterile sheet  Site Prep: Chlorhexidine  Local Anesthetic: Inje...  Site Condition No complications  Blue Lumen Status Capped (Central line)  Red Lumen Status Capped (Central line)  Purple Lumen Status N/A  Catheter fill solution Heparin 1000 units/ml  Catheter fill volume (Arterial) 1.5 cc  Catheter fill volume (Venous) 1.5  Dressing Type Biopatch;Occlusive  Dressing Status Clean;Dry;Intact  Interventions New dressing  Drainage Description None  Post treatment catheter status Capped and Clamped

## 2018-04-01 NOTE — Progress Notes (Signed)
Post HD Treatment    04/01/18 1610  Neurological  Level of Consciousness Alert  Orientation Level Oriented X4  Respiratory  Respiratory Pattern Regular;Unlabored;Symmetrical  Chest Assessment Chest expansion symmetrical  Bilateral Breath Sounds Clear  Cardiac  Pulse Regular  Heart Sounds S1, S2  Jugular Venous Distention (JVD) No  ECG Monitor Yes  Cardiac Rhythm NSR  Vascular  R Radial Pulse +2  L Radial Pulse +2  R Dorsalis Pedis Pulse +2  L Dorsalis Pedis Pulse +2  Edema Right lower extremity;Left lower extremity;Facial  RLE Edema Non-pitting  LLE Edema Non-Pitting  Facial Other (Comment) (non pitting)  Integumentary  Integumentary (WDL) X  Skin Color Pink  Skin Condition Dry  Skin Integrity Intact  Musculoskeletal  Musculoskeletal (WDL) WDL  GU Assessment  Genitourinary (WDL) X (HD pt/PD pt)  Peritoneal Catheter Right lower abdomen  Placement Date/Time: (c) 03/31/18 0800   Catheter Location: Right lower abdomen  Site Assessment Clean;Dry;Intact  Drainage Description None  Catheter status Capped  Dressing Occlusive  Dressing Status Clean;Dry;Intact  Psychosocial  Psychosocial (WDL) WDL (wife at bedside)

## 2018-04-01 NOTE — Progress Notes (Signed)
Pre HD Assessment    04/01/18 1200  Neurological  Level of Consciousness Alert  Orientation Level Oriented X4  Respiratory  Respiratory Pattern Regular;Unlabored;Symmetrical  Chest Assessment Chest expansion symmetrical  Bilateral Breath Sounds Clear  Cardiac  Pulse Regular  Heart Sounds S1, S2  Jugular Venous Distention (JVD) No  ECG Monitor Yes  Cardiac Rhythm NSR  Vascular  R Radial Pulse +2  L Radial Pulse +2  R Dorsalis Pedis Pulse +2  L Dorsalis Pedis Pulse +2  Edema Right lower extremity;Left lower extremity;Facial  RLE Edema Non-pitting  LLE Edema Non-Pitting  Facial Other (Comment) (non pitting)  Integumentary  Integumentary (WDL) X  Skin Color Pink  Skin Condition Dry  Skin Integrity Intact  Musculoskeletal  Musculoskeletal (WDL) WDL  GU Assessment  Genitourinary (WDL) X (HD pt/PD pt)  Peritoneal Catheter Right lower abdomen  Placement Date/Time: (c) 03/31/18 0800   Catheter Location: Right lower abdomen  Site Assessment Clean;Dry;Intact  Drainage Description None  Catheter status Capped  Dressing Occlusive  Dressing Status Clean;Dry;Intact  Psychosocial  Psychosocial (WDL) WDL (wife at bedside)

## 2018-04-01 NOTE — Progress Notes (Signed)
Dressing changed to PD catheter site. No drainage. Mild redness around insertion site only.

## 2018-04-01 NOTE — Progress Notes (Signed)
Pre HD and HD Initiated    04/01/18 1245  Vital Signs  Temp 98.1 F (36.7 C)  Temp Source Oral  Pulse Rate 69  Pulse Rate Source Monitor  Resp 11  BP (!) 151/104  BP Location Left Arm  BP Method Automatic  Patient Position (if appropriate) Lying  Oxygen Therapy  SpO2 92 %  O2 Device Room Air  Pain Assessment  Pain Scale 0-10  Pain Score 0  Dialysis Weight  Weight 81.9 kg  Type of Weight Pre-Dialysis  Time-Out for Hemodialysis  What Procedure? HD  Pt Identifiers(min of two) First/Last Name;MRN/Account#;Pt's DOB(use if MRN/Acct# not available  Correct Site? Yes  Correct Side? Yes  Correct Procedure? Yes  Consents Verified? Yes  Rad Studies Available? N/A  Safety Precautions Reviewed? Yes  [employer] Number [id number]  Station Number 6 (ICU 6)  UF/Alarm Test Passed  Conductivity: Meter 14  Conductivity: Machine  14.1  pH 7.4  Reverse Osmosis RO 1  Normal Saline Lot Number [id number]  Dialyzer Lot Number [id number]  Disposable Set Lot Number [id number]  Machine Temperature 98.6 F (37 C)  Musician and Audible Yes  Blood Lines Intact and Secured Yes  Pre Treatment Patient Checks  Vascular access used during treatment Catheter  Hepatitis B Surface Antigen Results Negative  Date Hepatitis B Surface Antigen Drawn 10/10/17  Hepatitis B Surface Antibody  (>10)  Date Hepatitis B Surface Antibody Drawn 10/10/17  Hemodialysis Consent Verified Yes  Hemodialysis Standing Orders Initiated Yes  ECG (Telemetry) Monitor On Yes  Prime Ordered Normal Saline  Length of  DialysisTreatment -hour(s) 3.5 Hour(s)  Dialyzer Elisio 17H NR  Dialysate 3K, 2.5 Ca  Variable Sodium Other (Comment)  Dialysis Anticoagulant None  Dialysate Flow Ordered 600  Blood Flow Rate Ordered 400 mL/min  Ultrafiltration Goal 3 Liters  Pre Treatment Labs Renal panel;Other (Comment)  Dialysis Blood Pressure Support Ordered Normal Saline  During Hemodialysis Assessment  Blood Flow Rate  (mL/min) 400 mL/min  Arterial Pressure (mmHg) -170 mmHg  Venous Pressure (mmHg) 130 mmHg  Transmembrane Pressure (mmHg) 70 mmHg  Ultrafiltration Rate (mL/min) 860 mL/min  Dialysate Flow Rate (mL/min) 600 ml/min  Conductivity: Machine  14.1  HD Safety Checks Performed Yes  Dialysis Fluid Bolus Normal Saline  Bolus Amount (mL) 250 mL  Intra-Hemodialysis Comments Tx initiated;Progressing as prescribed  Education / Care Plan  Dialysis Education Provided Yes  Documented Education in Care Plan Yes  Hemodialysis Catheter Right Subclavian  Placement Date/Time: 10/15/17 1612   Time Out: Correct patient;Correct site;Correct procedure  Maximum sterile barrier precautions: Hand hygiene;Cap;Mask;Sterile gown;Sterile gloves;Large sterile sheet  Site Prep: Chlorhexidine  Local Anesthetic: Inje...  Blue Lumen Status Infusing  Red Lumen Status Infusing  Purple Lumen Status N/A

## 2018-04-02 LAB — RENAL FUNCTION PANEL
ALBUMIN: 3.9 g/dL (ref 3.5–5.0)
ANION GAP: 12 (ref 5–15)
BUN: 32 mg/dL — ABNORMAL HIGH (ref 6–20)
CALCIUM: 8.4 mg/dL — AB (ref 8.9–10.3)
CO2: 25 mmol/L (ref 22–32)
Chloride: 100 mmol/L (ref 98–111)
Creatinine, Ser: 6.82 mg/dL — ABNORMAL HIGH (ref 0.61–1.24)
GFR calc non Af Amer: 9 mL/min — ABNORMAL LOW (ref >60)
GFR, EST AFRICAN AMERICAN: 11 mL/min — AB (ref >60)
Glucose, Bld: 97 mg/dL (ref 70–99)
PHOSPHORUS: 4.5 mg/dL (ref 2.5–4.6)
Potassium: 4.7 mmol/L (ref 3.5–5.1)
SODIUM: 137 mmol/L (ref 135–145)

## 2018-04-02 LAB — CBC
HCT: 35 % — ABNORMAL LOW (ref 39.0–52.0)
HEMOGLOBIN: 10.8 g/dL — AB (ref 13.0–17.0)
MCH: 30 pg (ref 26.0–34.0)
MCHC: 30.9 g/dL (ref 30.0–36.0)
MCV: 97.2 fL (ref 80.0–100.0)
Platelets: 140 10*3/uL — ABNORMAL LOW (ref 150–400)
RBC: 3.6 MIL/uL — ABNORMAL LOW (ref 4.22–5.81)
RDW: 17.4 % — ABNORMAL HIGH (ref 11.5–15.5)
WBC: 7.4 10*3/uL (ref 4.0–10.5)
nRBC: 0 % (ref 0.0–0.2)

## 2018-04-02 LAB — TROPONIN I: Troponin I: 0.03 ng/mL (ref <0.03)

## 2018-04-02 MED ORDER — KETOROLAC TROMETHAMINE 15 MG/ML IJ SOLN: 15.0000 mg | Freq: Once | INTRAMUSCULAR | Status: DC

## 2018-04-02 MED ORDER — HYDRALAZINE HCL 50 MG PO TABS
100.0000 mg | ORAL_TABLET | Freq: Three times a day (TID) | ORAL | Status: DC
  Administered 2018-04-02 – 2018-04-04 (×5): 100 mg via ORAL
  Filled 2018-04-02 (×5): qty 2

## 2018-04-02 MED ORDER — PROMETHAZINE HCL 25 MG/ML IJ SOLN
12.5000 mg | Freq: Four times a day (QID) | INTRAMUSCULAR | Status: DC | PRN
  Administered 2018-04-02: 12.5 mg via INTRAVENOUS
  Filled 2018-04-02: qty 1

## 2018-04-02 MED ORDER — NITROGLYCERIN 0.4 MG SL SUBL: 0.4000 mg | SUBLINGUAL_TABLET | SUBLINGUAL | Status: DC | PRN

## 2018-04-02 MED ORDER — HYDRALAZINE HCL 50 MG PO TABS
100.0000 mg | ORAL_TABLET | Freq: Once | ORAL | Status: AC
  Administered 2018-04-02: 100 mg via ORAL
  Filled 2018-04-02: qty 2

## 2018-04-02 MED ORDER — CLONIDINE HCL 0.1 MG PO TABS: 0.3000 mg | ORAL_TABLET | Freq: Two times a day (BID) | ORAL | Status: DC

## 2018-04-02 MED ORDER — MORPHINE SULFATE (PF) 2 MG/ML IV SOLN
INTRAVENOUS | Status: AC
  Administered 2018-04-02: 2 mg via INTRAVENOUS
  Filled 2018-04-02: qty 1

## 2018-04-02 MED ORDER — ISOSORBIDE MONONITRATE ER 30 MG PO TB24
30.0000 mg | ORAL_TABLET | Freq: Every day | ORAL | Status: DC
  Administered 2018-04-02 – 2018-04-04 (×3): 30 mg via ORAL
  Filled 2018-04-02 (×3): qty 1

## 2018-04-02 MED ORDER — PROMETHAZINE HCL 25 MG PO TABS
12.5000 mg | ORAL_TABLET | Freq: Four times a day (QID) | ORAL | Status: DC | PRN
  Filled 2018-04-02: qty 1

## 2018-04-02 MED ORDER — HYDRALAZINE HCL 20 MG/ML IJ SOLN
10.0000 mg | INTRAMUSCULAR | Status: DC | PRN
  Administered 2018-04-02 – 2018-04-04 (×5): 20 mg via INTRAVENOUS
  Filled 2018-04-02 (×6): qty 1

## 2018-04-02 MED ORDER — SUMATRIPTAN SUCCINATE 6 MG/0.5ML ~~LOC~~ SOLN
6.0000 mg | Freq: Once | SUBCUTANEOUS | Status: AC
  Administered 2018-04-02: 6 mg via SUBCUTANEOUS
  Filled 2018-04-02: qty 0.5

## 2018-04-02 MED ORDER — CLONIDINE HCL 0.1 MG PO TABS
0.3000 mg | ORAL_TABLET | Freq: Two times a day (BID) | ORAL | Status: DC
  Administered 2018-04-02 – 2018-04-04 (×5): 0.3 mg via ORAL
  Filled 2018-04-02 (×5): qty 3

## 2018-04-02 MED ORDER — MORPHINE SULFATE (PF) 2 MG/ML IV SOLN
2.0000 mg | Freq: Once | INTRAVENOUS | Status: AC
  Administered 2018-04-02: 2 mg via INTRAVENOUS

## 2018-04-02 NOTE — Progress Notes (Signed)
[facility]  ROUNDING NOTE   Subjective:   Hemodialysis treatment yesterday. Tolerated treatment well. UF of 2757mL.   PD catheter cleaned and new dressings and gentamicin applied  Remains on nicardipine gtt  Hydralazine and clonidine doses have been increased.   Objective:  Vital signs in last 24 hours:  Temp:  [97.5 F (36.4 C)-99.4 F (37.4 C)] 99 F (37.2 C) (10/29 2000) Pulse Rate:  [62-106] 64 (10/30 0600) Resp:  [6-26] 19 (10/30 0600) BP: (117-178)/(76-120) 117/88 (10/30 0600) SpO2:  [84 %-97 %] 96 % (10/30 0600) Weight:  [79.4 kg-81.9 kg] 79.4 kg (10/29 1615)  Weight change: 2.2 kg Filed Weights   03/31/18 0800 04/01/18 1245 04/01/18 1615  Weight: 79.7 kg 81.9 kg 79.4 kg    Intake/Output: I/O last 3 completed shifts: In: 1390.7 [I.V.:1390.7] Out: 3217 [Urine:450; Other:2767]   Intake/Output this shift:  No intake/output data recorded.  Physical Exam: General: NAD, laying in bed  Head: Normocephalic, atraumatic. Moist oral mucosal membranes  Eyes: Anicteric, PERRL  Neck: Supple, trachea midline  Lungs:  Clear to auscultation  Heart: Regular rate and rhythm  Abdomen:  Soft, nontender,   Extremities:  1+ peripheral edema.  Neurologic: Nonfocal, moving all four extremities  Skin: No lesions  Access: RIJ permcath, PD catheter    Basic Metabolic Panel: Recent Labs  Lab 03/31/18 0225 03/31/18 0654 04/01/18 0634 04/02/18 0054  NA 144  --  143 137  K 3.7  --  4.4 4.7  CL 105  --  102 100  CO2 29  --  28 25  GLUCOSE 91  --  85 97  BUN 41*  --  52* 32*  CREATININE 8.55*  --  10.45* 6.82*  CALCIUM 8.2*  --  8.4* 8.4*  MG  --  1.9  --   --   PHOS  --  5.4*  --  4.5    Liver Function Tests: Recent Labs  Lab 03/31/18 0654 04/02/18 0054  AST 24  --   ALT 16  --   ALKPHOS 41  --   BILITOT 0.9  --   PROT 6.7  --   ALBUMIN 3.9 3.9   No results for input(s): LIPASE, AMYLASE in the last 168 hours. No results for input(s): AMMONIA  in the last 168 hours.  CBC: Recent Labs  Lab 03/31/18 0225 04/01/18 0634 04/02/18 0054  WBC 6.6 9.1 7.4  NEUTROABS 4.4 6.4  --   HGB 9.0* 9.7* 10.8*  HCT 29.4* 32.4* 35.0*  MCV 100.3* 100.0 97.2  PLT 105* 120* 140*    Cardiac Enzymes: Recent Labs  Lab 04/02/18 0338  TROPONINI 0.03*    BNP: Invalid input(s): POCBNP  CBG: Recent Labs  Lab 03/31/18 0806 04/01/18 1647  GLUCAP 62 111*    Microbiology: Results for orders placed or performed during the hospital encounter of 03/31/18  MRSA PCR Screening     Status: None   Collection Time: 03/31/18  8:11 AM  Result Value Ref Range Status   MRSA by PCR NEGATIVE NEGATIVE Final    Comment:        The GeneXpert MRSA Assay (FDA approved for NASAL specimens only), is one component of a comprehensive MRSA colonization surveillance program. It is not intended to diagnose MRSA infection nor to guide or monitor treatment for MRSA infections. Performed at [facility], [address] [place], [place] [address]     Coagulation Studies: No results for input(s): LABPROT, INR in the last 72  hours.  Urinalysis: No results for input(s): COLORURINE, LABSPEC, PHURINE, GLUCOSEU, HGBUR, BILIRUBINUR, KETONESUR, PROTEINUR, UROBILINOGEN, NITRITE, LEUKOCYTESUR in the last 72 hours.  Invalid input(s): APPERANCEUR    Imaging: Mr Brain Wo Contrast  Result Date: 04/02/2018 CLINICAL DATA:  Initial evaluation for chronic headache. EXAM: MRI HEAD WITHOUT CONTRAST TECHNIQUE: Multiplanar, multiecho pulse sequences of the brain and surrounding structures were obtained without intravenous contrast. COMPARISON:  Prior CT from 03/31/2018. FINDINGS: Brain: Cerebral volume within normal limits for patient age. No focal parenchymal signal abnormality identified. No abnormal foci of restricted diffusion to suggest acute or subacute ischemia. Gray-white matter differentiation well maintained. No encephalomalacia to suggest chronic  infarction. No foci of susceptibility artifact to suggest acute or chronic intracranial hemorrhage. Mass lesion, midline shift or mass effect. No hydrocephalus. No extra-axial fluid collection. Major dural sinuses are grossly patent. Pituitary gland and suprasellar region are normal. Midline structures intact and normal. Vascular: Major intracranial vascular flow voids well maintained and normal in appearance. Skull and upper cervical spine: Craniocervical junction normal. Visualized upper cervical spine within normal limits. Bone marrow signal intensity normal. No scalp soft tissue abnormality. Sinuses/Orbits: Globes and orbital soft tissues within normal limits. Paranasal sinuses are clear. No mastoid effusion. Inner ear structures normal. Other: None. IMPRESSION: Normal brain MRI.  No acute intracranial abnormality identified. Electronically Signed   By: Jeannine Boga M.D.   On: 04/02/2018 03:26     Medications:   . niCARDipine 2 mg/hr (04/02/18 0700)   . amLODipine  10 mg Oral Daily  . calcium acetate  1,334 mg Oral TID WC  . Chlorhexidine Gluconate Cloth  6 each Topical Q0600  . citalopram  10 mg Oral Daily  . cloNIDine  0.2 mg Oral BID  . feeding supplement (NEPRO CARB STEADY)  237 mL Oral BID BM  . gentamicin cream   Topical Daily  . heparin  5,000 Units Subcutaneous Q8H  . hydrALAZINE  75 mg Oral TID  . losartan  100 mg Oral Daily  . metoprolol tartrate  50 mg Oral BID  . multivitamin  1 tablet Oral QHS  . pantoprazole  40 mg Oral Daily  . predniSONE  20 mg Oral Q breakfast  . rOPINIRole  0.5 mg Oral QHS   acetaminophen **OR** acetaminophen, ALPRAZolam, bisacodyl, calcium carbonate, nitroGLYCERIN, ondansetron **OR** ondansetron (ZOFRAN) IV, oxyCODONE-acetaminophen, promethazine, senna-docusate, traZODone  Assessment/ Plan:  Jeffrey Holloway is a 32 y.o. white male with end stage renal disease on hemodialysis, hypertension, crescentic glomerulonephritis, who was admitted  to [facility] on 10/28 for hypertension.   TTS [facility] [place] RIJ permcath 79 kg  1. End Stage Renal Disease: Tolerated hemodialysis treatment yesterday.  Secondary to seronegative pauci-immune vasculitis. Biopsy on 10/14/17. 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  UDS negative for illicit drugs on this admission - Continue TTS schedule.  - Clean PD catheter and apply gentimicin - Continue prednisone 20mg  daily.   2. Vasculitis: urine drug screen negative for cocaine which may exacerbate vasculitis - Increased prednisone to 20mg  daily.   3. Hypertension: urgency. Placed on nicardipine gtt.  MRI without signs of PRESS Home regimen of clonidine 0.28mcg bid, carvedilol 6.25mg  bid, hydralazine 50mg  tid, losartan 100mg  daily, amlodipine 10mg  daily  - Changed carvedilol to metoprolol.  - Increased dose of hydralazine and clonidine - wean off nicardipine gtt  4. Anemia of chronic kidney disease: hemoglobin 10.8. With thrombocytopenia.  - Hold EPO due to hypertension. Consider restarting tomorrow.   5. Secondary Hyperparathyroidism  with hyperphosphatemia. outpatient PTH 259.  Calcium at goal.  - calcium acetate 3 tabs with meals.    LOS: 2 Tyanne Derocher 10/30/20198:22 AM

## 2018-04-02 NOTE — Progress Notes (Signed)
Patient ID: Jeffrey Holloway, male   DOB: 26-Jan-1986, 32 y.o.   MRN: [id number]   [employer] PROGRESS NOTE  Jeffrey Holloway ZWC:[id number] DOB: 10/20/1985 DOA: 03/31/2018 PCP: Patient, No Pcp Per  HPI/Subjective: Patient feeling fine.  No complaints of headache.  States this is how he normally feels.  Blood pressure still very elevated even after taking off the nicardipine drip.  Objective: Vitals:   04/02/18 1500 04/02/18 1600  BP: (!) 150/100 (!) 153/96  Pulse: 70 70  Resp: 12 19  Temp:  98.3 F (36.8 C)  SpO2: 94% 95%    Filed Weights   03/31/18 0800 04/01/18 1245 04/01/18 1615  Weight: 79.7 kg 81.9 kg 79.4 kg    ROS: Review of Systems  Constitutional: Negative for chills and fever.  Eyes: Negative for blurred vision.  Respiratory: Negative for cough and shortness of breath.   Cardiovascular: Negative for chest pain.  Gastrointestinal: Negative for abdominal pain, constipation, diarrhea, nausea and vomiting.  Genitourinary: Negative for dysuria.  Musculoskeletal: Negative for joint pain.  Neurological: Negative for dizziness and headaches.   Exam: Physical Exam  Constitutional: He is oriented to person, place, and time.  HENT:  Nose: No mucosal edema.  Mouth/Throat: No oropharyngeal exudate or posterior oropharyngeal edema.  Eyes: Pupils are equal, round, and reactive to light. Conjunctivae, EOM and lids are normal.  Neck: No JVD present. Carotid bruit is not present. No edema present. No thyroid mass and no thyromegaly present.  Cardiovascular: S1 normal and S2 normal. Exam reveals no gallop.  No murmur heard. Pulses:      Dorsalis pedis pulses are 2+ on the right side, and 2+ on the left side.  Respiratory: No respiratory distress. He has no wheezes. He has no rhonchi. He has no rales.  GI: Soft. Bowel sounds are normal. There is no tenderness.  Musculoskeletal:       Right ankle: He exhibits no swelling.       Left ankle: He exhibits no swelling.   Lymphadenopathy:    He has no cervical adenopathy.  Neurological: He is alert and oriented to person, place, and time. No cranial nerve deficit.  Skin: Skin is warm. No rash noted. Nails show no clubbing.  Psychiatric: He has a normal mood and affect.      Data Reviewed: Basic Metabolic Panel: Recent Labs  Lab 03/31/18 0225 03/31/18 0654 04/01/18 0634 04/02/18 0054  NA 144  --  143 137  K 3.7  --  4.4 4.7  CL 105  --  102 100  CO2 29  --  28 25  GLUCOSE 91  --  85 97  BUN 41*  --  52* 32*  CREATININE 8.55*  --  10.45* 6.82*  CALCIUM 8.2*  --  8.4* 8.4*  MG  --  1.9  --   --   PHOS  --  5.4*  --  4.5   Liver Function Tests: Recent Labs  Lab 03/31/18 0654 04/02/18 0054  AST 24  --   ALT 16  --   ALKPHOS 41  --   BILITOT 0.9  --   PROT 6.7  --   ALBUMIN 3.9 3.9   CBC: Recent Labs  Lab 03/31/18 0225 04/01/18 0634 04/02/18 0054  WBC 6.6 9.1 7.4  NEUTROABS 4.4 6.4  --   HGB 9.0* 9.7* 10.8*  HCT 29.4* 32.4* 35.0*  MCV 100.3* 100.0 97.2  PLT 105* 120* 140*    CBG: Recent Labs  Lab  03/31/18 0806 04/01/18 1647  GLUCAP 78 111*    Recent Results (from the past 240 hour(s))  MRSA PCR Screening     Status: None   Collection Time: 03/31/18  8:11 AM  Result Value Ref Range Status   MRSA by PCR NEGATIVE NEGATIVE Final    Comment:        The GeneXpert MRSA Assay (FDA approved for NASAL specimens only), is one component of a comprehensive MRSA colonization surveillance program. It is not intended to diagnose MRSA infection nor to guide or monitor treatment for MRSA infections. Performed at [facility], [address] [place], [place] [address]      Studies: Mr Brain 8 Contrast  Result Date: 04/02/2018 CLINICAL DATA:  Initial evaluation for chronic headache. EXAM: MRI HEAD WITHOUT CONTRAST TECHNIQUE: Multiplanar, multiecho pulse sequences of the brain and surrounding structures were obtained without intravenous contrast. COMPARISON:   Prior CT from 03/31/2018. FINDINGS: Brain: Cerebral volume within normal limits for patient age. No focal parenchymal signal abnormality identified. No abnormal foci of restricted diffusion to suggest acute or subacute ischemia. Gray-white matter differentiation well maintained. No encephalomalacia to suggest chronic infarction. No foci of susceptibility artifact to suggest acute or chronic intracranial hemorrhage. Mass lesion, midline shift or mass effect. No hydrocephalus. No extra-axial fluid collection. Major dural sinuses are grossly patent. Pituitary gland and suprasellar region are normal. Midline structures intact and normal. Vascular: Major intracranial vascular flow voids well maintained and normal in appearance. Skull and upper cervical spine: Craniocervical junction normal. Visualized upper cervical spine within normal limits. Bone marrow signal intensity normal. No scalp soft tissue abnormality. Sinuses/Orbits: Globes and orbital soft tissues within normal limits. Paranasal sinuses are clear. No mastoid effusion. Inner ear structures normal. Other: None. IMPRESSION: Normal brain MRI.  No acute intracranial abnormality identified. Electronically Signed   By: Jeannine Boga M.D.   On: 04/02/2018 03:26    Scheduled Meds: . amLODipine  10 mg Oral Daily  . calcium acetate  1,334 mg Oral TID WC  . Chlorhexidine Gluconate Cloth  6 each Topical Q0600  . citalopram  10 mg Oral Daily  . cloNIDine  0.3 mg Oral BID  . feeding supplement (NEPRO CARB STEADY)  237 mL Oral BID BM  . gentamicin cream   Topical Daily  . heparin  5,000 Units Subcutaneous Q8H  . hydrALAZINE  100 mg Oral TID  . losartan  100 mg Oral Daily  . metoprolol tartrate  50 mg Oral BID  . multivitamin  1 tablet Oral QHS  . pantoprazole  40 mg Oral Daily  . predniSONE  20 mg Oral Q breakfast  . rOPINIRole  0.5 mg Oral QHS   Continuous Infusions: . niCARDipine Stopped (04/02/18 1048)    Assessment/Plan:  1. Malignant  hypertension.  Taken off the nicardipine drip this morning.  Continue titrating his oral medicines amlodipine, metoprolol, clonidine, hydralazine and losartan. 2. Headache.  Headache has disappeared.  MRI of the brain negative for stroke or press. 3. History of vasculitis.  Nephrology increased his prednisone to 20 mg daily 4. Polysubstance abuse.  Cannabis in this urine toxicology. 5. End-stage renal disease on dialysis Tuesday Thursday Saturday 6. Anxiety depression.  Started low-dose Celexa and as needed Xanax. 7. Anemia of chronic disease 8. Thrombocytopenia.  Previous hepatitis C was negative.  Code Status:     Code Status Orders  (From admission, onward)         Start     Ordered   03/31/18  8628  Full code  Continuous     03/31/18 0805        Code Status History    Date Active Date Inactive Code Status Order ID Comments User Context   11/25/2017 2159 11/27/2017 2231 Full Code [id number]  Nicholes Mango, MD Inpatient   10/10/2017 1338 10/18/2017 2059 Full Code [id number]  Arta Silence, MD ED     Family Communication: Family today Disposition Plan: Would like to have blood pressure trend better on oral medications prior to disposition  Consultants:  Critical care specialist  Neurology  Nephrology  Time spent: 24 minutes  [place]

## 2018-04-02 NOTE — Progress Notes (Signed)
Pt was complaining of chest discomfort, headache, generalized ache, anxiety, and nausea.  EKG obtained, not concerning for any ischemic changes, NSR.  Troponin pending.  Pt given 2 mg morphine and 12.5 Phenergan.  Prn SL Nitroglycerin ordered.  Continue to monitor.   Darel Hong, AGACNP-BC [facility] Pager: [phone] Cell: [phone]

## 2018-04-02 NOTE — Progress Notes (Signed)
Patient off nicardipine infusion. Oral medications adjusted to try and maintain BP without having to restart infusion. Patient alert and oriented. Voided twice during shift. Per nephrologist plan for dialysis tomorrow. PRN mediation given for headache twice during shift and once for anxiety.

## 2018-04-02 NOTE — Progress Notes (Signed)
Name: Jeffrey Holloway MRN: [id number] DOB: 1986-03-08     CONSULTATION DATE: 03/31/2018  Subjective & objectives: Off Cardene drip and continues to have intermittent headache  PAST MEDICAL HISTORY :   has a past medical history of Constipation, Diarrhea, Glomerulonephritis, Hemorrhoids, Kidney infection, Nausea and vomiting, Occult blood in stools, Shortness of breath, and Sleep apnea.  has a past surgical history that includes Appendectomy and DIALYSIS/PERMA CATHETER INSERTION (N/A, 10/15/2017). Prior to Admission medications   Medication Sig Start Date End Date Taking? Authorizing Provider  amLODipine (NORVASC) 10 MG tablet Take 1 tablet (10 mg total) by mouth daily. 10/18/17  Yes Epifanio Lesches, MD  calcium acetate (PHOSLO) 667 MG capsule Take 2 capsules (1,334 mg total) by mouth 3 (three) times daily with meals. 10/18/17  Yes Epifanio Lesches, MD  calcium carbonate (TUMS - DOSED IN MG ELEMENTAL CALCIUM) 500 MG chewable tablet Chew 1 tablet by mouth 2 (two) times daily.   Yes [provider]  cloNIDine (CATAPRES) 0.1 MG tablet Take 1 tablet (0.1 mg total) by mouth 2 (two) times daily. 10/18/17  Yes Epifanio Lesches, MD  hydrALAZINE (APRESOLINE) 25 MG tablet Take 1 tablet (25 mg total) by mouth 3 (three) times daily. Patient taking differently: Take 50 mg by mouth 3 (three) times daily.  12/30/17 12/30/18 Yes [place], [place], MD  losartan (COZAAR) 100 MG tablet Take 1 tablet (100 mg total) by mouth daily. 10/18/17  Yes Epifanio Lesches, MD  multivitamin (RENA-VIT) TABS tablet Take 1 tablet by mouth at bedtime. 10/18/17  Yes Epifanio Lesches, MD  Nutritional Supplements (FEEDING SUPPLEMENT, NEPRO CARB STEADY,) LIQD Take 237 mLs by mouth 2 (two) times daily between meals. 10/18/17  Yes Epifanio Lesches, MD  omeprazole (PRILOSEC) 20 MG capsule Take 20 mg by mouth daily.   Yes [provider]  predniSONE (DELTASONE) 20 MG tablet Take 1.5 tablets (30 mg  total) by mouth daily with breakfast. Patient taking differently: Take 20 mg by mouth daily with breakfast.  10/19/17  Yes Epifanio Lesches, MD  rOPINIRole (REQUIP) 0.5 MG tablet Take 1 tablet by mouth at bedtime. 03/21/18  Yes [provider]  cefdinir (OMNICEF) 300 MG capsule Take 1 capsule (300 mg total) by mouth 2 (two) times daily. Patient not taking: Reported on 03/31/2018 11/27/17   Salary, Avel Peace, MD  mupirocin ointment (BACTROBAN) 2 % Place 1 application into the nose 2 (two) times daily. Patient not taking: Reported on 03/31/2018 11/27/17   Salary, Holly Bodily D, MD  ondansetron (ZOFRAN ODT) 4 MG disintegrating tablet Take 1 tablet (4 mg total) by mouth every 8 (eight) hours as needed for nausea or vomiting. Patient not taking: Reported on 03/31/2018 02/05/18   Rudene Re, MD  oxyCODONE (OXY IR/ROXICODONE) 5 MG immediate release tablet Take 1-2 tablets (5-10 mg total) by mouth every 6 (six) hours as needed for moderate pain. Patient not taking: Reported on 11/25/2017 10/18/17   Epifanio Lesches, MD  traMADol (ULTRAM) 50 MG tablet Take 1 tablet (50 mg total) by mouth every 6 (six) hours as needed for moderate pain. Patient not taking: Reported on 03/31/2018 11/27/17   Salary, Avel Peace, MD   No Known Allergies  FAMILY HISTORY:  family history is not on file. SOCIAL HISTORY:  reports that he has been smoking cigarettes. He has been smoking about 0.25 packs per day. He has never used smokeless tobacco. He reports that he drinks alcohol. He reports that he has current or past drug history. Drugs: Marijuana and Cocaine.  REVIEW OF SYSTEMS:   Unable to obtain due to critical illness   VITAL SIGNS: Temp:  [98.3 F (36.8 C)-99.4 F (37.4 C)] 99 F (37.2 C) (10/30 1300) Pulse Rate:  [62-91] 68 (10/30 1330) Resp:  [8-26] 11 (10/30 1330) BP: (117-178)/(78-120) 158/111 (10/30 1330) SpO2:  [84 %-97 %] 91 % (10/30 1330) Weight:  [79.4 kg] 79.4 kg (10/29 1615)  Physical  Examination: Awake and oriented with no focal neurological deficits On room air, no distress, able to talk in full sentences, bilateral equal air entry with no adventitious sounds S1 and S2 are audible with no murmur Benign abdominal exam No peripheral edema   ASSESSMENT / PLAN:  Hypertensive emergency with headaches (PRES syndrome).  Off nicardipine. No acute intracranial abnormalities on CT head and normal brain MRI -Optimize antihypertensives and monitor hemodynamics -Optimize analgesia for headache and follow with neurology recommendations  End-stage renal disease on HD as per renal.History of glomerular nephritis and has been in the steroids of prednisone 20 mg p.o. once a day -Management as per renal  GERD -PPI  Anemia -Keep hemoglobin more than 7 g/dL  Thrombocytopenia -Monitor platelet count  Full code  Supportive care  Critical care time 35 minutes

## 2018-04-03 LAB — CBC
HCT: 30.9 % — ABNORMAL LOW (ref 39.0–52.0)
Hemoglobin: 9.6 g/dL — ABNORMAL LOW (ref 13.0–17.0)
MCH: 30.2 pg (ref 26.0–34.0)
MCHC: 31.1 g/dL (ref 30.0–36.0)
MCV: 97.2 fL (ref 80.0–100.0)
NRBC: 0 % (ref 0.0–0.2)
Platelets: 113 10*3/uL — ABNORMAL LOW (ref 150–400)
RBC: 3.18 MIL/uL — ABNORMAL LOW (ref 4.22–5.81)
RDW: 16.8 % — AB (ref 11.5–15.5)
WBC: 7.2 10*3/uL (ref 4.0–10.5)

## 2018-04-03 LAB — BASIC METABOLIC PANEL
Anion gap: 8 (ref 5–15)
BUN: 52 mg/dL — ABNORMAL HIGH (ref 6–20)
CALCIUM: 8.3 mg/dL — AB (ref 8.9–10.3)
CO2: 28 mmol/L (ref 22–32)
CREATININE: 9.06 mg/dL — AB (ref 0.61–1.24)
Chloride: 103 mmol/L (ref 98–111)
GFR calc non Af Amer: 7 mL/min — ABNORMAL LOW (ref >60)
GFR, EST AFRICAN AMERICAN: 8 mL/min — AB (ref >60)
Glucose, Bld: 90 mg/dL (ref 70–99)
Potassium: 4.9 mmol/L (ref 3.5–5.1)
SODIUM: 139 mmol/L (ref 135–145)

## 2018-04-03 MED ORDER — OXYCODONE-ACETAMINOPHEN 5-325 MG PO TABS: 1.0000 | ORAL_TABLET | Freq: Four times a day (QID) | ORAL | Status: DC | PRN

## 2018-04-03 MED ORDER — ALPRAZOLAM 0.25 MG PO TABS
0.2500 mg | ORAL_TABLET | ORAL | Status: AC
  Administered 2018-04-03: 0.25 mg via ORAL
  Filled 2018-04-03: qty 1

## 2018-04-03 MED ORDER — HYDRALAZINE HCL 100 MG PO TABS: 100.0000 mg | ORAL_TABLET | Freq: Three times a day (TID) | ORAL | 0 refills | Status: DC

## 2018-04-03 MED ORDER — CLONIDINE HCL 0.2 MG PO TABS: 0.3000 mg | ORAL_TABLET | Freq: Two times a day (BID) | ORAL | 0 refills | Status: DC

## 2018-04-03 MED ORDER — ISOSORBIDE MONONITRATE ER 30 MG PO TB24: 30.0000 mg | ORAL_TABLET | Freq: Every day | ORAL | 0 refills | Status: AC

## 2018-04-03 MED ORDER — OXYCODONE-ACETAMINOPHEN 5-325 MG PO TABS
1.0000 | ORAL_TABLET | Freq: Four times a day (QID) | ORAL | Status: DC | PRN
  Administered 2018-04-03 – 2018-04-04 (×4): 1 via ORAL
  Filled 2018-04-03 (×4): qty 1

## 2018-04-03 MED ORDER — OXYCODONE-ACETAMINOPHEN 5-325 MG PO TABS
1.0000 | ORAL_TABLET | Freq: Once | ORAL | Status: AC
  Administered 2018-04-03: 1 via ORAL
  Filled 2018-04-03: qty 1

## 2018-04-03 NOTE — Progress Notes (Signed)
Post HD Assessment    04/03/18 1850  Neurological  Level of Consciousness Alert  Orientation Level Oriented X4  Respiratory  Respiratory Pattern Regular;Unlabored;Symmetrical  Chest Assessment Chest expansion symmetrical  Bilateral Breath Sounds Clear  Cardiac  Pulse Regular  Heart Sounds S1, S2  Jugular Venous Distention (JVD) No  ECG Monitor Yes  Cardiac Rhythm NSR;SB  Vascular  R Radial Pulse +2  L Radial Pulse +2  R Dorsalis Pedis Pulse +2  L Dorsalis Pedis Pulse +2  Edema Generalized  Integumentary  Integumentary (WDL) WDL  Musculoskeletal  Musculoskeletal (WDL) WDL  GU Assessment  Genitourinary (WDL) X (HD pt)  Peritoneal Catheter Right lower abdomen  Placement Date/Time: (c) 03/31/18 0800   Catheter Location: Right lower abdomen  Site Assessment Other (Comment) (Dressing in place, unable to assess)  Drainage Description None  Catheter status Capped  Dressing Gauze/Drain sponge  Dressing Status Clean;Dry;Intact  Psychosocial  Psychosocial (WDL) WDL

## 2018-04-03 NOTE — Progress Notes (Signed)
Pre HD Assessment    04/03/18 1535  Neurological  Level of Consciousness Alert  Orientation Level Oriented X4  Respiratory  Respiratory Pattern Regular;Unlabored;Symmetrical  Chest Assessment Chest expansion symmetrical  Bilateral Breath Sounds Clear  Cardiac  Pulse Regular  Heart Sounds S1, S2  Jugular Venous Distention (JVD) No  ECG Monitor Yes  Cardiac Rhythm NSR;SB  Vascular  R Radial Pulse +2  L Radial Pulse +2  R Dorsalis Pedis Pulse +2  L Dorsalis Pedis Pulse +2  Edema Generalized  Integumentary  Integumentary (WDL) WDL  Musculoskeletal  Musculoskeletal (WDL) WDL  GU Assessment  Genitourinary (WDL) X (HD pt)  Peritoneal Catheter Right lower abdomen  Placement Date/Time: (c) 03/31/18 0800   Catheter Location: Right lower abdomen  Site Assessment Other (Comment) (Dressing in place, unable to assess)  Drainage Description None  Catheter status Capped  Dressing Gauze/Drain sponge  Dressing Status Clean;Dry;Intact  Psychosocial  Psychosocial (WDL) WDL

## 2018-04-03 NOTE — Progress Notes (Signed)
[facility]  ROUNDING NOTE   Subjective:   Hemodialysis for later today  Weaned off nicardipine gtt.   Objective:  Vital signs in last 24 hours:  Temp:  [98.1 F (36.7 C)-99 F (37.2 C)] 98.1 F (36.7 C) (10/31 0800) Pulse Rate:  [53-80] 63 (10/31 0800) Resp:  [8-20] 17 (10/31 0800) BP: (131-180)/(90-118) 168/99 (10/31 0800) SpO2:  [91 %-97 %] 93 % (10/31 0800)  Weight change:  Filed Weights   03/31/18 0800 04/01/18 1245 04/01/18 1615  Weight: 79.7 kg 81.9 kg 79.4 kg    Intake/Output: I/O last 3 completed shifts: In: 1647.9 [P.O.:960; I.V.:687.9] Out: 150 [Urine:150]   Intake/Output this shift:  Total I/O In: -  Out: 200 [Urine:200]  Physical Exam: General: NAD, laying in bed  Head: Normocephalic, atraumatic. Moist oral mucosal membranes  Eyes: Anicteric, PERRL  Neck: Supple, trachea midline  Lungs:  Clear to auscultation  Heart: Regular rate and rhythm  Abdomen:  Soft, nontender,   Extremities: trace peripheral edema.  Neurologic: Nonfocal, moving all four extremities  Skin: No lesions  Access: RIJ permcath, PD catheter    Basic Metabolic Panel: Recent Labs  Lab 03/31/18 0225 03/31/18 0654 04/01/18 0634 04/02/18 0054 04/03/18 0346  NA 144  --  143 137 139  K 3.7  --  4.4 4.7 4.9  CL 105  --  102 100 103  CO2 29  --  28 25 28   GLUCOSE 91  --  85 97 90  BUN 41*  --  52* 32* 52*  CREATININE 8.55*  --  10.45* 6.82* 9.06*  CALCIUM 8.2*  --  8.4* 8.4* 8.3*  MG  --  1.9  --   --   --   PHOS  --  5.4*  --  4.5  --     Liver Function Tests: Recent Labs  Lab 03/31/18 0654 04/02/18 0054  AST 24  --   ALT 16  --   ALKPHOS 41  --   BILITOT 0.9  --   PROT 6.7  --   ALBUMIN 3.9 3.9   No results for input(s): LIPASE, AMYLASE in the last 168 hours. No results for input(s): AMMONIA in the last 168 hours.  CBC: Recent Labs  Lab 03/31/18 0225 04/01/18 0634 04/02/18 0054 04/03/18 0346  WBC 6.6 9.1 7.4 7.2  NEUTROABS 4.4 6.4  --    --   HGB 9.0* 9.7* 10.8* 9.6*  HCT 29.4* 32.4* 35.0* 30.9*  MCV 100.3* 100.0 97.2 97.2  PLT 105* 120* 140* 113*    Cardiac Enzymes: Recent Labs  Lab 04/02/18 0338  TROPONINI 0.03*    BNP: Invalid input(s): POCBNP  CBG: Recent Labs  Lab 03/31/18 0806 04/01/18 1647  GLUCAP 49 111*    Microbiology: Results for orders placed or performed during the hospital encounter of 03/31/18  MRSA PCR Screening     Status: None   Collection Time: 03/31/18  8:11 AM  Result Value Ref Range Status   MRSA by PCR NEGATIVE NEGATIVE Final    Comment:        The GeneXpert MRSA Assay (FDA approved for NASAL specimens only), is one component of a comprehensive MRSA colonization surveillance program. It is not intended to diagnose MRSA infection nor to guide or monitor treatment for MRSA infections. Performed at [facility], [address] [place], [place] [address]     Coagulation Studies: No results for input(s): LABPROT, INR in the last 72 hours.  Urinalysis: No results  for input(s): COLORURINE, LABSPEC, [place], GLUCOSEU, HGBUR, BILIRUBINUR, KETONESUR, PROTEINUR, UROBILINOGEN, NITRITE, LEUKOCYTESUR in the last 72 hours.  Invalid input(s): APPERANCEUR    Imaging: Mr Brain Wo Contrast  Result Date: 04/02/2018 CLINICAL DATA:  Initial evaluation for chronic headache. EXAM: MRI HEAD WITHOUT CONTRAST TECHNIQUE: Multiplanar, multiecho pulse sequences of the brain and surrounding structures were obtained without intravenous contrast. COMPARISON:  Prior CT from 03/31/2018. FINDINGS: Brain: Cerebral volume within normal limits for patient age. No focal parenchymal signal abnormality identified. No abnormal foci of restricted diffusion to suggest acute or subacute ischemia. Gray-white matter differentiation well maintained. No encephalomalacia to suggest chronic infarction. No foci of susceptibility artifact to suggest acute or chronic intracranial hemorrhage. Mass lesion, midline  shift or mass effect. No hydrocephalus. No extra-axial fluid collection. Major dural sinuses are grossly patent. Pituitary gland and suprasellar region are normal. Midline structures intact and normal. Vascular: Major intracranial vascular flow voids well maintained and normal in appearance. Skull and upper cervical spine: Craniocervical junction normal. Visualized upper cervical spine within normal limits. Bone marrow signal intensity normal. No scalp soft tissue abnormality. Sinuses/Orbits: Globes and orbital soft tissues within normal limits. Paranasal sinuses are clear. No mastoid effusion. Inner ear structures normal. Other: None. IMPRESSION: Normal brain MRI.  No acute intracranial abnormality identified. Electronically Signed   By: Jeannine Boga M.D.   On: 04/02/2018 03:26     Medications:   . niCARDipine Stopped (04/02/18 1048)   . amLODipine  10 mg Oral Daily  . calcium acetate  1,334 mg Oral TID WC  . Chlorhexidine Gluconate Cloth  6 each Topical Q0600  . citalopram  10 mg Oral Daily  . cloNIDine  0.3 mg Oral BID  . feeding supplement (NEPRO CARB STEADY)  237 mL Oral BID BM  . gentamicin cream   Topical Daily  . heparin  5,000 Units Subcutaneous Q8H  . hydrALAZINE  100 mg Oral TID  . isosorbide mononitrate  30 mg Oral Daily  . losartan  100 mg Oral Daily  . metoprolol tartrate  50 mg Oral BID  . multivitamin  1 tablet Oral QHS  . pantoprazole  40 mg Oral Daily  . predniSONE  20 mg Oral Q breakfast  . rOPINIRole  0.5 mg Oral QHS   acetaminophen **OR** acetaminophen, ALPRAZolam, bisacodyl, calcium carbonate, hydrALAZINE, nitroGLYCERIN, ondansetron **OR** ondansetron (ZOFRAN) IV, oxyCODONE-acetaminophen, promethazine, senna-docusate, traZODone  Assessment/ Plan:  [name] is a 32 y.o. white male with end stage renal disease on hemodialysis, hypertension, crescentic glomerulonephritis, who was admitted to [facility] on 10/28 for hypertension.   TTS [facility] RIJ permcath 79 kg  1. End Stage Renal Disease: Tolerated hemodialysis treatment yesterday.  Secondary to seronegative pauci-immune vasculitis. Biopsy on 10/14/17. 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  UDS negative for illicit drugs on this admission - Continue TTS schedule. Dialysis for later today - Clean PD catheter and apply gentimicin. Flush catheter today - Continue prednisone 20mg  daily.   2. Vasculitis: urine drug screen negative for cocaine which may exacerbate vasculitis - Increased prednisone to 20mg  daily.   3. Hypertension: urgency. Placed on nicardipine gtt.  MRI without signs of PRESS Home regimen of clonidine 0.40mcg bid, carvedilol 6.25mg  bid, hydralazine 50mg  tid, losartan 100mg  daily, amlodipine 10mg  daily  - Changed carvedilol to metoprolol.  - Increased dose of hydralazine and clonidine - wean off nicardipine gtt  4. Anemia of chronic kidney disease: hemoglobin 9.6. With thrombocytopenia.  - EPO  5. Secondary Hyperparathyroidism with hyperphosphatemia. outpatient PTH 259.  Calcium at goal.  - calcium acetate 3 tabs with meals.    LOS: 3 Milagros Middendorf 10/31/20198:53 AM

## 2018-04-03 NOTE — Progress Notes (Signed)
Pre HD Treatment     04/03/18 1533  Vital Signs  Temp 98 F (36.7 C)  Temp Source Oral  Pulse Rate 60  Pulse Rate Source Monitor  Resp 18  BP (!) 169/112  BP Location Right Arm  BP Method Automatic  Patient Position (if appropriate) Sitting  Oxygen Therapy  SpO2 99 %  O2 Device Room Air  Pain Assessment  Pain Scale 0-10  Pain Score 6  Pain Type Acute pain  Pain Location Head  Pain Orientation Posterior  Pain Descriptors / Indicators Aching  Pain Frequency Intermittent  Pain Onset On-going  Patients Stated Pain Goal 0  Pain Intervention(s) RN made aware  Multiple Pain Sites No  Dialysis Weight  Weight 80.3 kg  Type of Weight Pre-Dialysis  Time-Out for Hemodialysis  What Procedure? HD  Pt Identifiers(min of two) First/Last Name;MRN/Account#;Pt's DOB(use if MRN/Acct# not available  Correct Site? Yes  Correct Side? Yes  Correct Procedure? Yes  Consents Verified? Yes  Rad Studies Available? N/A  Safety Precautions Reviewed? Yes  Engineer, civil (consulting) Number 2  Station Number 1  UF/Alarm Test Passed  Conductivity: Meter 14  Conductivity: Machine  14.2  pH 7.4  Reverse Osmosis Main  Normal Saline Lot Number [id number]  Dialyzer Lot Number [id number]  Disposable Set Lot Number [id number]  Machine Temperature 98 F (36.7 C)  Musician and Audible Yes  Blood Lines Intact and Secured Yes  Pre Treatment Patient Checks  Vascular access used during treatment Catheter  Patient is receiving dialysis in a chair Yes  Hepatitis B Surface Antigen Results Negative  Date Hepatitis B Surface Antigen Drawn 10/10/17  Hepatitis B Surface Antibody  (>10)  Date Hepatitis B Surface Antibody Drawn 10/10/17  Hemodialysis Consent Verified Yes  Hemodialysis Standing Orders Initiated Yes  ECG (Telemetry) Monitor On Yes  Prime Ordered Normal Saline  Length of  DialysisTreatment -hour(s) 3 Hour(s)  Dialyzer Elisio 17H NR  Dialysate 3K, 2.5 Ca  Variable Sodium Other (Comment)   Dialysis Anticoagulant None  Dialysate Flow Ordered 600  Blood Flow Rate Ordered 400 mL/min  Ultrafiltration Goal 2 Liters  Dialysis Blood Pressure Support Ordered Normal Saline  Education / Care Plan  Dialysis Education Provided Yes  Documented Education in Care Plan Yes  Hemodialysis Catheter Right Subclavian  Placement Date/Time: 10/15/17 1612   Time Out: Correct patient;Correct site;Correct procedure  Maximum sterile barrier precautions: Hand hygiene;Cap;Mask;Sterile gown;Sterile gloves;Large sterile sheet  Site Prep: Chlorhexidine  Local Anesthetic: Inje...  Site Condition No complications  Blue Lumen Status Capped (Central line)  Red Lumen Status Capped (Central line)  Purple Lumen Status N/A  Dressing Type Biopatch;Occlusive  Dressing Status Clean;Dry;Intact  Drainage Description None

## 2018-04-03 NOTE — Progress Notes (Signed)
Report given to receiving RN, Danae Chen with no further questions.

## 2018-04-03 NOTE — Progress Notes (Addendum)
Post HD Treatment  Pt tolerated HD treatment well. His Net UF was 2037 and BVP was 69.6. All blood was returned to patient. No complaints noted. Report called to primary RN who was unavailable, Delane Ginger, RN accepted report for patient.     04/03/18 1855  Hand-Off documentation  Report given to (Full Name) Stephannie Peters, RN  Report received from (Full Name) Delane Ginger, RN  Vital Signs  Temp 98.1 F (36.7 C)  Temp Source Oral  Pulse Rate 84  Pulse Rate Source Monitor  Resp 18  BP (!) 184/103  BP Location Right Arm  BP Method Automatic  Patient Position (if appropriate) Sitting  Oxygen Therapy  SpO2 100 %  O2 Device Room Air  Pain Assessment  Pain Scale 0-10  Pain Score 3  POSS Scale ([place] Opioid Sedation Scale)  POSS *See Group Information* S-Acceptable,Sleep, easy to arouse  Dialysis Weight  Weight 78.7 kg  Type of Weight Post-Dialysis  Post-Hemodialysis Assessment  Rinseback Volume (mL) 250 mL  KECN 264 V  Dialyzer Clearance Lightly streaked  Duration of HD Treatment -hour(s) 3 hour(s)  Hemodialysis Intake (mL) 500 mL  UF Total -Machine (mL) 2537 mL  Net UF (mL) 2037 mL  Tolerated HD Treatment Yes  Post-Hemodialysis Comments Pt tolerated treatment well  Hemodialysis Catheter Right Subclavian  Placement Date/Time: 10/15/17 1612   Time Out: Correct patient;Correct site;Correct procedure  Maximum sterile barrier precautions: Hand hygiene;Cap;Mask;Sterile gown;Sterile gloves;Large sterile sheet  Site Prep: Chlorhexidine  Local Anesthetic: Inje...  Site Condition No complications  Blue Lumen Status Capped (Central line)  Red Lumen Status Capped (Central line)  Purple Lumen Status N/A  Catheter fill solution Heparin 1000 units/ml  Catheter fill volume (Arterial) 1.5 cc  Catheter fill volume (Venous) 1.5  Dressing Type Biopatch;Occlusive  Dressing Status Clean;Dry;Intact  Drainage Description None  Post treatment catheter status Capped and Clamped

## 2018-04-03 NOTE — Progress Notes (Signed)
Patient with room assignment 256, receiving RN Jacob Moores will call back for report.  Patient updated, currently in no apparent distress.  Continue to monitor.

## 2018-04-03 NOTE — Progress Notes (Signed)
Name: Jeffrey Holloway MRN: [id number] DOB: Jan 07, 1986     CONSULTATION DATE: 03/31/2018  Objective & objective: Off Cardene drip.  PAST MEDICAL HISTORY :   has a past medical history of Constipation, Diarrhea, Glomerulonephritis, Hemorrhoids, Kidney infection, Nausea and vomiting, Occult blood in stools, Shortness of breath, and Sleep apnea.  has a past surgical history that includes Appendectomy and DIALYSIS/PERMA CATHETER INSERTION (N/A, 10/15/2017). Prior to Admission medications   Medication Sig Start Date End Date Taking? Authorizing Provider  amLODipine (NORVASC) 10 MG tablet Take 1 tablet (10 mg total) by mouth daily. 10/18/17  Yes Epifanio Lesches, MD  calcium acetate (PHOSLO) 667 MG capsule Take 2 capsules (1,334 mg total) by mouth 3 (three) times daily with meals. 10/18/17  Yes Epifanio Lesches, MD  calcium carbonate (TUMS - DOSED IN MG ELEMENTAL CALCIUM) 500 MG chewable tablet Chew 1 tablet by mouth 2 (two) times daily.   Yes [provider]  cloNIDine (CATAPRES) 0.1 MG tablet Take 1 tablet (0.1 mg total) by mouth 2 (two) times daily. 10/18/17  Yes Epifanio Lesches, MD  hydrALAZINE (APRESOLINE) 25 MG tablet Take 1 tablet (25 mg total) by mouth 3 (three) times daily. Patient taking differently: Take 50 mg by mouth 3 (three) times daily.  12/30/17 12/30/18 Yes [place], [place], MD  losartan (COZAAR) 100 MG tablet Take 1 tablet (100 mg total) by mouth daily. 10/18/17  Yes Epifanio Lesches, MD  multivitamin (RENA-VIT) TABS tablet Take 1 tablet by mouth at bedtime. 10/18/17  Yes Epifanio Lesches, MD  Nutritional Supplements (FEEDING SUPPLEMENT, NEPRO CARB STEADY,) LIQD Take 237 mLs by mouth 2 (two) times daily between meals. 10/18/17  Yes Epifanio Lesches, MD  omeprazole (PRILOSEC) 20 MG capsule Take 20 mg by mouth daily.   Yes [provider]  predniSONE (DELTASONE) 20 MG tablet Take 1.5 tablets (30 mg total) by mouth daily with breakfast. Patient  taking differently: Take 20 mg by mouth daily with breakfast.  10/19/17  Yes Epifanio Lesches, MD  rOPINIRole (REQUIP) 0.5 MG tablet Take 1 tablet by mouth at bedtime. 03/21/18  Yes [provider]  cefdinir (OMNICEF) 300 MG capsule Take 1 capsule (300 mg total) by mouth 2 (two) times daily. Patient not taking: Reported on 03/31/2018 11/27/17   Salary, Holly Bodily D, MD  cloNIDine (CATAPRES) 0.2 MG tablet Take 1.5 tablets (0.3 mg total) by mouth 2 (two) times daily. 04/03/18   Hillary Bow, MD  hydrALAZINE (APRESOLINE) 100 MG tablet Take 1 tablet (100 mg total) by mouth 3 (three) times daily. 04/03/18   Hillary Bow, MD  isosorbide mononitrate (IMDUR) 30 MG 24 hr tablet Take 1 tablet (30 mg total) by mouth daily. 04/04/18   Hillary Bow, MD  mupirocin ointment (BACTROBAN) 2 % Place 1 application into the nose 2 (two) times daily. Patient not taking: Reported on 03/31/2018 11/27/17   Salary, Holly Bodily D, MD  ondansetron (ZOFRAN ODT) 4 MG disintegrating tablet Take 1 tablet (4 mg total) by mouth every 8 (eight) hours as needed for nausea or vomiting. Patient not taking: Reported on 03/31/2018 02/05/18   Rudene Re, MD  oxyCODONE (OXY IR/ROXICODONE) 5 MG immediate release tablet Take 1-2 tablets (5-10 mg total) by mouth every 6 (six) hours as needed for moderate pain. Patient not taking: Reported on 11/25/2017 10/18/17   Epifanio Lesches, MD  traMADol (ULTRAM) 50 MG tablet Take 1 tablet (50 mg total) by mouth every 6 (six) hours as needed for moderate pain. Patient not taking: Reported on 03/31/2018 11/27/17  Salary, Avel Peace, MD   No Known Allergies  FAMILY HISTORY:  family history is not on file. SOCIAL HISTORY:  reports that he has been smoking cigarettes. He has been smoking about 0.25 packs per day. He has never used smokeless tobacco. He reports that he drinks alcohol. He reports that he has current or past drug history. Drugs: Marijuana and Cocaine.  REVIEW OF SYSTEMS:     Unable to obtain due to critical illness   VITAL SIGNS: Temp:  [98.1 F (36.7 C)-98.3 F (36.8 C)] 98.1 F (36.7 C) (10/31 0800) Pulse Rate:  [53-80] 54 (10/31 1100) Resp:  [6-20] 12 (10/31 1300) BP: (116-180)/(89-118) 116/89 (10/31 1300) SpO2:  [91 %-97 %] 94 % (10/31 1100) Physical Examination: Awake and oriented with no focal neurological deficits On room air, no distress, able to talk in full sentences, bilateral equal air entry with no adventitious sounds S1 and S2 are audible with no murmur Benign abdominal exam No peripheral edema   ASSESSMENT / PLAN:  Hypertensive emergency with headaches(PRESsyndrome).  Off nicardipine. No acute intracranial abnormalities on CT head and normal brain MRI -Optimize antihypertensives and monitor hemodynamics -Optimize analgesia for headache and follow with neurology recommendations  End-stage renal disease on HD as per renal.History of glomerular nephritis and has been in the steroids of prednisone 20 mg p.o. once a day -Management as per renal  GERD -PPI  Anemia -Keep hemoglobin more than 7 g/dL  Thrombocytopenia -Monitor platelet count  Full code  Supportive care  Critical care time49minutes

## 2018-04-03 NOTE — Progress Notes (Signed)
Pt's PD catheter has been flushed, with ease, and exit site care was performed. New dressing in place, no s/s of infection noted.    04/03/18 1635  Peritoneal Catheter Right lower abdomen  Placement Date/Time: (c) 03/31/18 0800   Catheter Location: Right lower abdomen  Site Assessment Clean;Dry;Intact  Drainage Description None  Catheter status Flushed  Dressing Gauze/Drain sponge  Dressing Status Clean;Dry;Intact  Dressing Intervention Dressing changed  Completion  Exit Site Care Performed Yes  Education / Care Plan  Dialysis Education Provided Yes  Documented Education in Care Plan Yes  Hand-Off documentation  Report given to (Full Name) Evelina Dun  Report received from (Full Name) Stark Bray

## 2018-04-03 NOTE — Progress Notes (Signed)
HD Treatment Complete    04/03/18 1852  Vital Signs  Pulse Rate 77  Pulse Rate Source Monitor  Resp 16  BP (!) 182/111  BP Location Right Arm  BP Method Automatic  Patient Position (if appropriate) Sitting  Oxygen Therapy  SpO2 100 %  O2 Device Room Air  During Hemodialysis Assessment  Blood Flow Rate (mL/min) 400 mL/min  Arterial Pressure (mmHg) -180 mmHg  Venous Pressure (mmHg) 150 mmHg  Transmembrane Pressure (mmHg) 60 mmHg  Ultrafiltration Rate (mL/min) 820 mL/min  Dialysate Flow Rate (mL/min) 600 ml/min  Conductivity: Machine  14.1  HD Safety Checks Performed Yes  Intra-Hemodialysis Comments Progressing as prescribed;Tolerated well;Tx completed (UF 2537)

## 2018-04-03 NOTE — Progress Notes (Signed)
HD Treatment Initiated    04/03/18 1540  During Hemodialysis Assessment  Blood Flow Rate (mL/min) 400 mL/min  Arterial Pressure (mmHg) -150 mmHg  Venous Pressure (mmHg) 130 mmHg  Transmembrane Pressure (mmHg) 60 mmHg  Ultrafiltration Rate (mL/min) 820 mL/min  Dialysate Flow Rate (mL/min) 600 ml/min  Conductivity: Machine  14.1  HD Safety Checks Performed Yes  Dialysis Fluid Bolus Normal Saline  Bolus Amount (mL) 250 mL  Intra-Hemodialysis Comments Tx initiated;Progressing as prescribed  Hemodialysis Catheter Right Subclavian  Placement Date/Time: 10/15/17 1612   Time Out: Correct patient;Correct site;Correct procedure  Maximum sterile barrier precautions: Hand hygiene;Cap;Mask;Sterile gown;Sterile gloves;Large sterile sheet  Site Prep: Chlorhexidine  Local Anesthetic: Inje...  Blue Lumen Status Infusing  Red Lumen Status Infusing

## 2018-04-03 NOTE — Progress Notes (Signed)
Pt with respiratory rate of 8-10 breaths.  HR 48-50 in SB.  Delice Lesch, NP.  Changed Percocet from 2 tabs to 1 tab (original dose).  Continue to monitor.

## 2018-04-04 MED ORDER — METOPROLOL TARTRATE 50 MG PO TABS: 50.0000 mg | ORAL_TABLET | Freq: Two times a day (BID) | ORAL | 2 refills | Status: DC

## 2018-04-04 NOTE — Care Management Note (Signed)
Case Management Note  Patient Details  Name: Jeffrey Holloway MRN: [id number] Date of Birth: 07/07/1985  Subjective/Objective:    Patient is discharging to home today with wife.  Current HD at [facility] [address]; patient TTS.  Independent in all adls, denies issues accessing medical care, obtaining medications or with transportation.  Current with PCP.  No discharge needs identified at present by care manager or members of care team                Action/Plan:   Expected Discharge Date:  04/04/18               Expected Discharge Plan:  Home/Self Care  In-House Referral:     Discharge planning Services     Post Acute Care Choice:    Choice offered to:     DME Arranged:    DME Agency:     HH Arranged:    [place] Agency:     Status of Service:  Completed, signed off  If discussed at [employer] of Stay Meetings, dates discussed:    Additional Comments:  Elza Rafter, RN 04/04/2018, 12:19 PM

## 2018-04-04 NOTE — Progress Notes (Signed)
Patient given discharge instructions. IV taken out and tele monitor off. Patient going home.

## 2018-04-04 NOTE — Progress Notes (Signed)
Patient ID: Jeffrey Holloway, male   DOB: 03-17-1986, 32 y.o.   MRN: [id number]   [facility] PROGRESS NOTE  Jeffrey Holloway VCB:[id number] DOB: Feb 15, 1986 DOA: 03/31/2018 PCP: Patient, No Pcp Per  HPI/Subjective:  Intermittent headache. Off nicardipine drip Objective: Vitals:   04/03/18 1926 04/04/18 0422  BP: (!) 185/106 (!) 150/108  Pulse: 69 68  Resp: 18 18  Temp: 98 F (36.7 C) 98.4 F (36.9 C)  SpO2: 99% 98%    Filed Weights   04/03/18 1855 04/03/18 1926 04/04/18 0422  Weight: 78.7 kg 76.7 kg 76.9 kg    ROS: Review of Systems  Constitutional: Negative for chills and fever.  Eyes: Negative for blurred vision.  Respiratory: Negative for cough and shortness of breath.   Cardiovascular: Negative for chest pain.  Gastrointestinal: Negative for abdominal pain, constipation, diarrhea, nausea and vomiting.  Genitourinary: Negative for dysuria.  Musculoskeletal: Negative for joint pain.  Neurological: Negative for dizziness and headaches.   Exam: Physical Exam  Constitutional: He is oriented to person, place, and time.  HENT:  Nose: No mucosal edema.  Mouth/Throat: No oropharyngeal exudate or posterior oropharyngeal edema.  Eyes: Pupils are equal, round, and reactive to light. Conjunctivae, EOM and lids are normal.  Neck: No JVD present. Carotid bruit is not present. No edema present. No thyroid mass and no thyromegaly present.  Cardiovascular: S1 normal and S2 normal. Exam reveals no gallop.  No murmur heard. Pulses:      Dorsalis pedis pulses are 2+ on the right side, and 2+ on the left side.  Respiratory: No respiratory distress. He has no wheezes. He has no rhonchi. He has no rales.  GI: Soft. Bowel sounds are normal. There is no tenderness.  Musculoskeletal:       Right ankle: He exhibits no swelling.       Left ankle: He exhibits no swelling.  Lymphadenopathy:    He has no cervical adenopathy.  Neurological: He is alert and oriented to person, place, and  time. No cranial nerve deficit.  Skin: Skin is warm. No rash noted. Nails show no clubbing.  Psychiatric: He has a normal mood and affect.      Data Reviewed: Basic Metabolic Panel: Recent Labs  Lab 03/31/18 0225 03/31/18 0654 04/01/18 0634 04/02/18 0054 04/03/18 0346  NA 144  --  143 137 139  K 3.7  --  4.4 4.7 4.9  CL 105  --  102 100 103  CO2 29  --  28 25 28   GLUCOSE 91  --  85 97 90  BUN 41*  --  52* 32* 52*  CREATININE 8.55*  --  10.45* 6.82* 9.06*  CALCIUM 8.2*  --  8.4* 8.4* 8.3*  MG  --  1.9  --   --   --   PHOS  --  5.4*  --  4.5  --    Liver Function Tests: Recent Labs  Lab 03/31/18 0654 04/02/18 0054  AST 24  --   ALT 16  --   ALKPHOS 41  --   BILITOT 0.9  --   PROT 6.7  --   ALBUMIN 3.9 3.9   CBC: Recent Labs  Lab 03/31/18 0225 04/01/18 0634 04/02/18 0054 04/03/18 0346  WBC 6.6 9.1 7.4 7.2  NEUTROABS 4.4 6.4  --   --   HGB 9.0* 9.7* 10.8* 9.6*  HCT 29.4* 32.4* 35.0* 30.9*  MCV 100.3* 100.0 97.2 97.2  PLT 105* 120* 140* 113*  CBG: Recent Labs  Lab 03/31/18 0806 04/01/18 1647  GLUCAP 78 111*    Recent Results (from the past 240 hour(s))  MRSA PCR Screening     Status: None   Collection Time: 03/31/18  8:11 AM  Result Value Ref Range Status   MRSA by PCR NEGATIVE NEGATIVE Final    Comment:        The GeneXpert MRSA Assay (FDA approved for NASAL specimens only), is one component of a comprehensive MRSA colonization surveillance program. It is not intended to diagnose MRSA infection nor to guide or monitor treatment for MRSA infections. Performed at [facility], [address] [place], [place] [address]      Studies: No results found.  Scheduled Meds: . amLODipine  10 mg Oral Daily  . calcium acetate  1,334 mg Oral TID WC  . Chlorhexidine Gluconate Cloth  6 each Topical Q0600  . citalopram  10 mg Oral Daily  . cloNIDine  0.3 mg Oral BID  . feeding supplement (NEPRO CARB STEADY)  237 mL Oral BID BM  .  gentamicin cream   Topical Daily  . heparin  5,000 Units Subcutaneous Q8H  . hydrALAZINE  100 mg Oral TID  . isosorbide mononitrate  30 mg Oral Daily  . losartan  100 mg Oral Daily  . metoprolol tartrate  50 mg Oral BID  . multivitamin  1 tablet Oral QHS  . pantoprazole  40 mg Oral Daily  . predniSONE  20 mg Oral Q breakfast  . rOPINIRole  0.5 mg Oral QHS   Continuous Infusions:   Assessment/Plan:  1. Malignant hypertension.  Taken off the nicardipine drip  Continues to be high. Continue titrating his oral medicines amlodipine, metoprolol, clonidine, hydralazine and losartan.Doses on Hydralazine. Clonidine increased. Imdur added today. Discussed with intensivist Dr. Soyla Murphy. 2. Headache.  MRI of the brain negative for stroke or press. 3. History of vasculitis.  Nephrology increased his prednisone to 20 mg daily 4. Polysubstance abuse.  Cannabis in this urine toxicology. 5. End-stage renal disease on dialysis Tuesday Thursday Saturday 6. Anxiety depression.  Started low-dose Celexa and as needed Xanax. 7. Anemia of chronic disease 8. Thrombocytopenia.  Previous hepatitis C was negative.  Code Status:     Code Status Orders  (From admission, onward)         Start     Ordered   03/31/18 0806  Full code  Continuous     03/31/18 0805        Code Status History    Date Active Date Inactive Code Status Order ID Comments User Context   11/25/2017 2159 11/27/2017 2231 Full Code [id number]  Nicholes Mango, MD Inpatient   10/10/2017 1338 10/18/2017 2059 Full Code [id number]  Arta Silence, MD ED      Disposition Plan: Would like to have blood pressure trend better on oral medications prior to disposition  Consultants:  Critical care specialist  Neurology  Nephrology  Time spent: 35 minutes  [facility]

## 2018-04-04 NOTE — Progress Notes (Signed)
[facility]  ROUNDING NOTE   Subjective:   Hemodialysis treatment yesterday. Tolerated treatment well. UF of 2 liters PD catheter flushed yesterday.  Dipping tobacco  Objective:  Vital signs in last 24 hours:  Temp:  [97.7 F (36.5 C)-98.4 F (36.9 C)] 97.7 F (36.5 C) (11/01 0753) Pulse Rate:  [53-84] 60 (11/01 0753) Resp:  [6-22] 18 (11/01 0422) BP: (116-189)/(88-120) 141/88 (11/01 0753) SpO2:  [94 %-100 %] 96 % (11/01 0753) Weight:  [76.7 kg-80.3 kg] 76.9 kg (11/01 0422)  Weight change:  Filed Weights   04/03/18 1855 04/03/18 1926 04/04/18 0422  Weight: 78.7 kg 76.7 kg 76.9 kg    Intake/Output: I/O last 3 completed shifts: In: 360 [P.O.:360] Out: 2587 [Urine:550; Other:2037]   Intake/Output this shift:  No intake/output data recorded.  Physical Exam: General: NAD, laying in bed  Head: Normocephalic, atraumatic. Moist oral mucosal membranes  Eyes: Anicteric, PERRL  Neck: Supple, trachea midline  Lungs:  Clear to auscultation  Heart: Regular rate and rhythm  Abdomen:  Soft, nontender,   Extremities: no peripheral edema.  Neurologic: Nonfocal, moving all four extremities  Skin: No lesions  Access: RIJ permcath, PD catheter    Basic Metabolic Panel: Recent Labs  Lab 03/31/18 0225 03/31/18 0654 04/01/18 0634 04/02/18 0054 04/03/18 0346  NA 144  --  143 137 139  K 3.7  --  4.4 4.7 4.9  CL 105  --  102 100 103  CO2 29  --  28 25 28   GLUCOSE 91  --  85 97 90  BUN 41*  --  52* 32* 52*  CREATININE 8.55*  --  10.45* 6.82* 9.06*  CALCIUM 8.2*  --  8.4* 8.4* 8.3*  MG  --  1.9  --   --   --   PHOS  --  5.4*  --  4.5  --     Liver Function Tests: Recent Labs  Lab 03/31/18 0654 04/02/18 0054  AST 24  --   ALT 16  --   ALKPHOS 41  --   BILITOT 0.9  --   PROT 6.7  --   ALBUMIN 3.9 3.9   No results for input(s): LIPASE, AMYLASE in the last 168 hours. No results for input(s): AMMONIA in the last 168 hours.  CBC: Recent Labs  Lab  03/31/18 0225 04/01/18 0634 04/02/18 0054 04/03/18 0346  WBC 6.6 9.1 7.4 7.2  NEUTROABS 4.4 6.4  --   --   HGB 9.0* 9.7* 10.8* 9.6*  HCT 29.4* 32.4* 35.0* 30.9*  MCV 100.3* 100.0 97.2 97.2  PLT 105* 120* 140* 113*    Cardiac Enzymes: Recent Labs  Lab 04/02/18 0338  TROPONINI 0.03*    BNP: Invalid input(s): POCBNP  CBG: Recent Labs  Lab 03/31/18 0806 04/01/18 1647  GLUCAP 94 111*    Microbiology: Results for orders placed or performed during the hospital encounter of 03/31/18  MRSA PCR Screening     Status: None   Collection Time: 03/31/18  8:11 AM  Result Value Ref Range Status   MRSA by PCR NEGATIVE NEGATIVE Final    Comment:        The GeneXpert MRSA Assay (FDA approved for NASAL specimens only), is one component of a comprehensive MRSA colonization surveillance program. It is not intended to diagnose MRSA infection nor to guide or monitor treatment for MRSA infections. Performed at [facility], [address] [place], [place] [address]     Coagulation Studies: No results for  input(s): LABPROT, INR in the last 72 hours.  Urinalysis: No results for input(s): COLORURINE, LABSPEC, PHURINE, GLUCOSEU, HGBUR, BILIRUBINUR, KETONESUR, PROTEINUR, UROBILINOGEN, NITRITE, LEUKOCYTESUR in the last 72 hours.  Invalid input(s): APPERANCEUR    Imaging: No results found.   Medications:    . amLODipine  10 mg Oral Daily  . calcium acetate  1,334 mg Oral TID WC  . Chlorhexidine Gluconate Cloth  6 each Topical Q0600  . citalopram  10 mg Oral Daily  . cloNIDine  0.3 mg Oral BID  . feeding supplement (NEPRO CARB STEADY)  237 mL Oral BID BM  . gentamicin cream   Topical Daily  . heparin  5,000 Units Subcutaneous Q8H  . hydrALAZINE  100 mg Oral TID  . isosorbide mononitrate  30 mg Oral Daily  . losartan  100 mg Oral Daily  . metoprolol tartrate  50 mg Oral BID  . multivitamin  1 tablet Oral QHS  . pantoprazole  40 mg Oral Daily  . predniSONE  20  mg Oral Q breakfast  . rOPINIRole  0.5 mg Oral QHS   acetaminophen **OR** acetaminophen, ALPRAZolam, bisacodyl, calcium carbonate, hydrALAZINE, nitroGLYCERIN, ondansetron **OR** ondansetron (ZOFRAN) IV, oxyCODONE-acetaminophen, promethazine, senna-docusate, traZODone  Assessment/ Plan:  [name] is a 32 y.o. white male with end stage renal disease on hemodialysis, hypertension, crescentic glomerulonephritis, who was admitted to [facility] on 10/28 for hypertension.   TTS [facility] RIJ permcath 79 kg  1. End Stage Renal Disease: Tolerated hemodialysis treatment yesterday.  Secondary to seronegative pauci-immune vasculitis. Biopsy on 10/14/17.  - Continue TTS schedule.  - Clean PD catheter and apply gentimicin. Flushed PD catheter yesterday - Continue prednisone 20mg  daily.   2. Vasculitis: urine drug screen negative for cocaine which may exacerbate vasculitis - Increased prednisone to 20mg  daily.   3. Hypertension: urgency. Placed on nicardipine gtt on admission  MRI without signs of PRESS Home regimen of clonidine 0.47mcg bid, carvedilol 6.25mg  bid, hydralazine 50mg  tid, losartan 100mg  daily, amlodipine 10mg  daily  - Changed carvedilol to metoprolol.  - Increased dose of hydralazine and clonidine  4. Anemia of chronic kidney disease: hemoglobin 9.6. With thrombocytopenia.  - EPO with HD treatment.  5. Secondary Hyperparathyroidism with hyperphosphatemia. outpatient PTH 259.  Calcium at goal.  - calcium acetate 3 tabs with meals.    LOS: 4 Jeffrey Holloway 11/1/20199:50 AM

## 2018-04-05 NOTE — Discharge Summary (Signed)
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 Constipation   . Diarrhea   . Glomerulonephritis   . Hemorrhoids   . Kidney infection   . Nausea and vomiting   . Occult blood in stools   . Shortness of breath   . Sleep apnea     HOSPITAL COURSE:   32 year old male with end-stage renal disease on Tuesday, Thursday and Saturday hemodialysis, uncontrolled hypertension and multiple admissions for elevated blood pressure comes to hospital secondary to headache and noted to have elevated blood pressure  1.  Hypertensive urgency-started on nicardipine drip, was in the ICU -Weaned off the drip.  Patient states he is compliant with his medications.  Meds have been adjusted.  Blood pressure still slightly elevated but he says that is normal and denies any complaints -Is discharged on Imdur, metoprolol which are added new.  Already on Norvasc, losartan, clonidine and hydralazine.  2.  GERD-Prilosec  3.  End-stage renal disease on hemodialysis-appreciate nephrology input while in the hospital and patient is on Tuesday, Thursday and Saturday hemodialysis  4.  Restless leg syndrome-continue Requip  Up and ambulatory.  Will be discharged home today  DISCHARGE CONDITIONS:   Guarded  CONSULTS OBTAINED:   Treatment Team:  Arta Silence, MD Lavonia Dana, MD Alexis Goodell, MD  DRUG ALLERGIES:   No Known Allergies DISCHARGE MEDICATIONS:   Allergies as of 04/04/2018   No Known Allergies     Medication List    STOP taking these medications   cefdinir 300 MG capsule Commonly known as:  OMNICEF   mupirocin ointment 2 % Commonly known as:  BACTROBAN   ondansetron 4 MG disintegrating tablet Commonly known as:  ZOFRAN-ODT   oxyCODONE 5 MG immediate release tablet Commonly known as:  Oxy IR/ROXICODONE   traMADol 50 MG tablet Commonly known as:  ULTRAM     TAKE these medications   amLODipine 10 MG tablet Commonly known as:  NORVASC Take 1 tablet (10 mg total) by mouth daily.   calcium acetate 667 MG capsule Commonly known as:  PHOSLO Take 2 capsules (1,334 mg total) by mouth 3 (three) times daily with meals.   calcium carbonate 500 MG chewable tablet Commonly known as:  TUMS - dosed in mg elemental calcium Chew 1 tablet by mouth 2 (two) times daily.   cloNIDine 0.2 MG tablet Commonly known as:  CATAPRES Take 1.5 tablets (0.3 mg total) by mouth 2 (two) times daily. What changed:    medication strength  how much to take   feeding supplement (NEPRO CARB STEADY) Liqd Take 237 mLs by mouth 2 (two) times daily between meals.   hydrALAZINE 100 MG tablet Commonly known as:  APRESOLINE Take 1 tablet (100 mg total) by mouth 3 (three) times daily. What changed:    medication strength  how much to take   isosorbide mononitrate 30 MG 24 hr tablet Commonly known  as:  IMDUR Take 1 tablet (30 mg total) by mouth daily.   losartan 100 MG tablet Commonly known as:  COZAAR Take 1 tablet (100 mg total) by mouth daily.   metoprolol tartrate 50 MG tablet Commonly known as:  LOPRESSOR Take 1 tablet (50 mg total) by mouth 2 (two) times daily.   multivitamin Tabs tablet Take 1 tablet by mouth at bedtime.   omeprazole 20 MG capsule Commonly known as:  PRILOSEC Take 20 mg by mouth daily.   predniSONE 20 MG tablet Commonly known as:   DELTASONE Take 1.5 tablets (30 mg total) by mouth daily with breakfast. What changed:  how much to take   rOPINIRole 0.5 MG tablet Commonly known as:  REQUIP Take 1 tablet by mouth at bedtime.        DISCHARGE INSTRUCTIONS:   1.  PCP follow-up in 1 to 2 weeks 2.  For dialysis as scheduled  DIET:   Renal diet  ACTIVITY:   Activity as tolerated  OXYGEN:   Home Oxygen: No.  Oxygen Delivery: room air  DISCHARGE LOCATION:   home   If you experience worsening of your admission symptoms, develop shortness of breath, life threatening emergency, suicidal or homicidal thoughts you must seek medical attention immediately by calling 911 or calling your MD immediately  if symptoms less severe.  You Must read complete instructions/literature along with all the possible adverse reactions/side effects for all the Medicines you take and that have been prescribed to you. Take any new Medicines after you have completely understood and accpet all the possible adverse reactions/side effects.   Please note  You were cared for by a hospitalist during your hospital stay. If you have any questions about your discharge medications or the care you received while you were in the hospital after you are discharged, you can call the unit and asked to speak with the hospitalist on call if the hospitalist that took care of you is not available. Once you are discharged, your primary care physician will handle any further medical issues. Please note that NO REFILLS for any discharge medications will be authorized once you are discharged, as it is imperative that you return to your primary care physician (or establish a relationship with a primary care physician if you do not have one) for your aftercare needs so that they can reassess your need for medications and monitor your lab values.    On the day of Discharge:  VITAL SIGNS:   Blood pressure (!) 155/98, pulse (!) 55, temperature 97.9 F (36.6 C),  temperature source Oral, resp. rate 18, height 6' (1.829 m), weight 76.9 kg, SpO2 98 %.  PHYSICAL EXAMINATION:    GENERAL:  32 y.o.-year-old patient lying in the bed with no acute distress.  EYES: Pupils equal, round, reactive to light and accommodation. No scleral icterus. Extraocular muscles intact.  HEENT: Head atraumatic, normocephalic. Oropharynx and nasopharynx clear.  NECK:  Supple, no jugular venous distention. No thyroid enlargement, no tenderness.  LUNGS: Normal breath sounds bilaterally, no wheezing, rales,rhonchi or crepitation. No use of accessory muscles of respiration.  CARDIOVASCULAR: S1, S2 normal. No murmurs, rubs, or gallops.  ABDOMEN: Soft, non-tender, non-distended. Bowel sounds present. No organomegaly or mass.  Peritoneal dialysis catheter in place EXTREMITIES: No pedal edema, cyanosis, or clubbing.  Right chest permacath is present. NEUROLOGIC: Cranial nerves II through XII are intact. Muscle strength 5/5 in all extremities. Sensation intact. Gait not checked.  PSYCHIATRIC: The patient is alert and oriented x  3.  SKIN: No obvious rash, lesion, or ulcer.   DATA REVIEW:   CBC Recent Labs  Lab 04/03/18 0346  WBC 7.2  HGB 9.6*  HCT 30.9*  PLT 113*    Chemistries  Recent Labs  Lab 03/31/18 0654  04/03/18 0346  NA  --    < > 139  K  --    < > 4.9  CL  --    < > 103  CO2  --    < > 28  GLUCOSE  --    < > 90  BUN  --    < > 52*  CREATININE  --    < > 9.06*  CALCIUM  --    < > 8.3*  MG 1.9  --   --   AST 24  --   --   ALT 16  --   --   ALKPHOS 41  --   --   BILITOT 0.9  --   --    < > = values in this interval not displayed.     Microbiology Results  Results for orders placed or performed during the hospital encounter of 03/31/18  MRSA PCR Screening     Status: None   Collection Time: 03/31/18  8:11 AM  Result Value Ref Range Status   MRSA by PCR NEGATIVE NEGATIVE Final    Comment:        The GeneXpert MRSA Assay (FDA approved for NASAL  specimens only), is one component of a comprehensive MRSA colonization surveillance program. It is not intended to diagnose MRSA infection nor to guide or monitor treatment for MRSA infections. Performed at [facility], [address], [place], [place] [address]     RADIOLOGY:  No results found.   Management plans discussed with the patient, family and they are in agreement.  CODE STATUS:  Code Status History    Date Active Date Inactive Code Status Order ID Comments User Context   03/31/2018 0805 04/04/2018 1644 Full Code [id number]  Arta Silence, MD Inpatient   11/25/2017 2159 11/27/2017 2231 Full Code [id number]  Nicholes Mango, MD Inpatient   10/10/2017 1338 10/18/2017 2059 Full Code [id number]  Arta Silence, MD ED      TOTAL TIME TAKING CARE OF THIS PATIENT: 38 minutes.    Gladstone Lighter M.D on 04/05/2018 at 1:31 PM  Between 7am to 6pm - Pager - [phone]  After 6pm go to [email or web address] - Proofreader  [facility]  Office  [phone]  CC: Primary care physician; Patient, No Pcp Per   Note: This dictation was prepared with Dragon dictation along with smaller phrase technology. Any transcriptional errors that result from this process are unintentional.

## 2018-04-07 ENCOUNTER — Telehealth: Payer: Self-pay

## 2018-04-07 NOTE — Telephone Encounter (Signed)
Flagged on EMMI report for not knowing who to call about changes in condition and not having a follow up scheduled.  First attempt to reach patient made on 04/07/18, however unable to reach patient.  Left voicemail encouraging callback. Will attempt at later time.

## 2018-04-09 NOTE — Telephone Encounter (Signed)
Reached patient upon 2nd attempt. Reviewed AVS and reminded patient to follow up with with Dr. Holley Raring at dialysis center.  Per discharge summary, also instructed to follow up with PCP in 1-2 weeks.  Per patient, he does not have a PCP. Number for [facility] Referral Line given to patient and encouraged him to call.  No other questions or concerns.  I thanked him for his time and informed him that he would receive one more automated call checking in during the next day or so.

## 2018-05-06 ENCOUNTER — Encounter (INDEPENDENT_AMBULATORY_CARE_PROVIDER_SITE_OTHER): Payer: Self-pay

## 2018-05-12 ENCOUNTER — Encounter: Payer: Self-pay | Admitting: Vascular Surgery

## 2018-05-12 ENCOUNTER — Other Ambulatory Visit (INDEPENDENT_AMBULATORY_CARE_PROVIDER_SITE_OTHER): Payer: Self-pay | Admitting: Nurse Practitioner

## 2018-05-12 ENCOUNTER — Encounter
Admission: RE | Disposition: A | Discharge status: Home / Self Care | Payer: Self-pay | Source: Ambulatory Visit | Attending: Vascular Surgery

## 2018-05-12 ENCOUNTER — Ambulatory Visit
Admission: RE | Admit: 2018-05-12 | Discharge: 2018-05-12 | Disposition: A | Discharge status: Home / Self Care | Payer: BLUE CROSS/BLUE SHIELD | Source: Ambulatory Visit | Attending: Vascular Surgery | Admitting: Vascular Surgery

## 2018-05-12 DIAGNOSIS — G4730 Sleep apnea, unspecified: Secondary | ICD-10-CM | POA: Insufficient documentation

## 2018-05-12 DIAGNOSIS — Z452 Encounter for adjustment and management of vascular access device: Secondary | ICD-10-CM | POA: Diagnosis present

## 2018-05-12 DIAGNOSIS — F17210 Nicotine dependence, cigarettes, uncomplicated: Secondary | ICD-10-CM | POA: Insufficient documentation

## 2018-05-12 DIAGNOSIS — N186 End stage renal disease: Secondary | ICD-10-CM | POA: Diagnosis not present

## 2018-05-12 DIAGNOSIS — D638 Anemia in other chronic diseases classified elsewhere: Secondary | ICD-10-CM | POA: Insufficient documentation

## 2018-05-12 DIAGNOSIS — Z992 Dependence on renal dialysis: Secondary | ICD-10-CM

## 2018-05-12 HISTORY — PX: DIALYSIS/PERMA CATHETER REMOVAL: CATH118289

## 2018-05-12 SURGERY — DIALYSIS/PERMA CATHETER REMOVAL
Anesthesia: LOCAL

## 2018-05-12 MED ORDER — LIDOCAINE-EPINEPHRINE (PF) 1 %-1:200000 IJ SOLN
INTRAMUSCULAR | Status: AC
  Filled 2018-05-12: qty 20

## 2018-05-12 MED ORDER — ONDANSETRON HCL 4 MG/2ML IJ SOLN: 4.0000 mg | Freq: Four times a day (QID) | INTRAMUSCULAR | Status: DC | PRN

## 2018-05-12 MED ORDER — LIDOCAINE-EPINEPHRINE (PF) 1 %-1:200000 IJ SOLN
INTRAMUSCULAR | Status: DC | PRN
  Administered 2018-05-12: 20 mL via INTRADERMAL

## 2018-05-12 MED ORDER — CEFAZOLIN SODIUM-DEXTROSE 1-4 GM/50ML-% IV SOLN: 1.0000 g | Freq: Once | INTRAVENOUS | Status: DC

## 2018-05-12 MED ORDER — HYDROMORPHONE HCL 1 MG/ML IJ SOLN: 1.0000 mg | Freq: Once | INTRAMUSCULAR | Status: DC | PRN

## 2018-05-12 SURGICAL SUPPLY — 2 items
FORCEPS HALSTEAD CVD 5IN STRL (INSTRUMENTS) ×2 IMPLANT
TRAY LACERAT/PLASTIC (MISCELLANEOUS) ×2 IMPLANT

## 2018-05-12 NOTE — H&P (Addendum)
[facility] Admission History & Physical  MRN : [id number]  Jeffrey Holloway is a 32 y.o. (Apr 19, 1986) male who presents with chief complaint of No chief complaint on file. Marland Kitchen  History of Present Illness: I am asked to evaluate the patient by the dialysis center. The patient was sent here because they have a nonfunctioning tunneled catheter and a functioning PD catheter.    Patient denies pain or tenderness overlying the access.  There is no pain with dialysis.     Current Facility-Administered Medications  Medication Dose Route Frequency Provider Last Rate Last Dose  . ceFAZolin (ANCEF) IVPB 1 g/50 mL premix  1 g Intravenous Once Eulogio Ditch E, NP      . HYDROmorphone (DILAUDID) injection 1 mg  1 mg Intravenous Once PRN Kris Hartmann, NP      . ondansetron (ZOFRAN) injection 4 mg  4 mg Intravenous Q6H PRN Kris Hartmann, NP        Past Medical History:  Diagnosis Date  . Constipation   . Diarrhea   . Glomerulonephritis   . Hemorrhoids   . Kidney infection   . Nausea and vomiting   . Occult blood in stools   . Shortness of breath   . Sleep apnea     Past Surgical History:  Procedure Laterality Date  . APPENDECTOMY    . DIALYSIS/PERMA CATHETER INSERTION N/A 10/15/2017   Procedure: DIALYSIS/PERMA CATHETER INSERTION;  Surgeon: Katha Cabal, MD;  Location: [facility];  Service: Cardiovascular;  Laterality: N/A;  Peritoneal dialysis catheter placement  Social History Social History   Tobacco Use  . Smoking status: Current Every Day Smoker    Packs/day: 0.25    Types: Cigarettes  . Smokeless tobacco: Never Used  Substance Use Topics  . Alcohol use: Yes  . Drug use: Yes    Types: Marijuana, Cocaine    Family History Family History  Problem Relation Age of Onset  . Varicose Veins Neg Hx   . Vision loss Neg Hx   . Stroke Neg Hx     No family history of bleeding or clotting disorders, autoimmune disease or porphyria  No  Known Allergies   REVIEW OF SYSTEMS (Negative unless checked)  Constitutional: [] Weight loss  [] Fever  [] Chills Cardiac: [] Chest pain   [] Chest pressure   [] Palpitations   [] Shortness of breath when laying flat   [] Shortness of breath at rest   [x] Shortness of breath with exertion. Vascular:  [] Pain in legs with walking   [] Pain in legs at rest   [] Pain in legs when laying flat   [] Claudication   [] Pain in feet when walking  [] Pain in feet at rest  [] Pain in feet when laying flat   [] History of DVT   [] Phlebitis   [] Swelling in legs   [] Varicose veins   [] Non-healing ulcers Pulmonary:   [] Uses home oxygen   [] Productive cough   [] Hemoptysis   [] Wheeze  [] COPD   [] Asthma Neurologic:  [] Dizziness  [] Blackouts   [] Seizures   [] History of stroke   [] History of TIA  [] Aphasia   [] Temporary blindness   [] Dysphagia   [] Weakness or numbness in arms   [] Weakness or numbness in legs Musculoskeletal:  [] Arthritis   [] Joint swelling   [] Joint pain   [] Low back pain Hematologic:  [] Easy bruising  [] Easy bleeding   [] Hypercoagulable state   [] Anemic  [] Hepatitis Gastrointestinal:  [] Blood in stool   [] Vomiting blood  [] Gastroesophageal reflux/heartburn   []   Difficulty swallowing. Genitourinary:  [x] Chronic kidney disease   [] Difficult urination  [] Frequent urination  [] Burning with urination   [] Blood in urine Skin:  [] Rashes   [] Ulcers   [] Wounds Psychological:  [] History of anxiety   []  History of major depression.  Physical Examination  Vitals:   05/12/18 1133  BP: (!) 147/95  Pulse: (!) 55  Resp: 17  Temp: 97.7 F (36.5 C)  TempSrc: Oral  SpO2: 96%   There is no height or weight on file to calculate BMI. Gen: WD/WN, NAD Head: [place]/AT, No temporalis wasting. Prominent temp pulse not noted. Ear/Nose/Throat: Hearing grossly intact, nares w/o erythema or drainage, oropharynx w/o Erythema/Exudate,  Eyes: Conjunctiva clear, sclera non-icteric Neck: Trachea midline.  No JVD.  Pulmonary:  Good air  movement, respirations not labored, no use of accessory muscles.  Cardiac: RRR, normal S1, S2. Vascular: permcath present in right chest Vessel Right Left  Radial Palpable Palpable   Musculoskeletal: M/S 5/5 throughout.  Extremities without ischemic changes.  No deformity or atrophy.  Neurologic: Sensation grossly intact in extremities.  Symmetrical.  Speech is fluent. Motor exam as listed above. Psychiatric: Judgment intact, Mood & affect appropriate for pt's clinical situation. Dermatologic: No rashes or ulcers noted.  No cellulitis or open wounds.    CBC Lab Results  Component Value Date   WBC 7.2 04/03/2018   HGB 9.6 (L) 04/03/2018   HCT 30.9 (L) 04/03/2018   MCV 97.2 04/03/2018   PLT 113 (L) 04/03/2018    BMET    Component Value Date/Time   NA 139 04/03/2018 0346   NA 134 (L) 01/17/2012 0126   K 4.9 04/03/2018 0346   K 3.7 01/17/2012 0126   CL 103 04/03/2018 0346   CL 100 01/17/2012 0126   CO2 28 04/03/2018 0346   CO2 27 01/17/2012 0126   GLUCOSE 90 04/03/2018 0346   GLUCOSE 98 01/17/2012 0126   BUN 52 (H) 04/03/2018 0346   BUN 7 01/17/2012 0126   CREATININE 9.06 (H) 04/03/2018 0346   CREATININE 0.94 01/17/2012 0126   CALCIUM 8.3 (L) 04/03/2018 0346   CALCIUM 7.1 (L) 10/10/2017 1400   GFRNONAA 7 (L) 04/03/2018 0346   GFRNONAA >60 01/17/2012 0126   GFRAA 8 (L) 04/03/2018 0346   GFRAA >60 01/17/2012 0126   CrCl cannot be calculated (Patient's most recent lab result is older than the maximum 21 days allowed.).  COAG Lab Results  Component Value Date   INR 0.95 10/14/2017   INR 0.93 10/14/2017    Radiology No results found.  Assessment/Plan 1.  Complication dialysis device:  Patient's Tunneled catheter is not being used. The patient has a peritoneal dialysis catheter that is functioning well. Therefore, the patient will undergo removal of the tunneled catheter under local anesthesia.  The risks and benefits were described to the patient.  All questions  were answered.  The patient agrees to proceed with angiography and intervention. Potassium will be drawn to ensure that it is an appropriate level prior to performing intervention. 2.  End-stage renal disease.  Now on peritoneal dialysis and this is working well. 3.  Anemia of chronic disease.  Stable.      Leotis Pain, MD  05/12/2018 11:40 AM

## 2018-05-12 NOTE — Op Note (Signed)
Operative Note     Preoperative diagnosis:   1. ESRD with functional permanent access  Postoperative diagnosis:  1. ESRD with functional permanent access  Procedure:  Removal of right jugular Permcath  Surgeon:  Leotis Pain, MD  Anesthesia:  Local  EBL:  Minimal  Indication for the Procedure:  The patient has a functional permanent dialysis access and no longer needs their permcath.  This can be removed.  Risks and benefits are discussed and informed consent is obtained.  Description of the Procedure:  The patient's right neck, chest and existing catheter were sterilely prepped and draped. The area around the catheter was anesthetized copiously with 1% lidocaine. The catheter was dissected out with curved hemostats until the cuff was freed from the surrounding fibrous sheath. The fiber sheath was transected, and the catheter was then removed in its entirety using gentle traction. Pressure was held and sterile dressings were placed. The patient tolerated the procedure well and was taken to the recovery room in stable condition.     Leotis Pain  05/12/2018, 12:13 PM This note was created with Dragon Medical transcription system. Any errors in dictation are purely unintentional.

## 2018-05-19 ENCOUNTER — Ambulatory Visit
Admit: 2018-05-19 | Discharge: 2018-05-20 | Payer: PRIVATE HEALTH INSURANCE | Attending: Health Service | Primary: Health Service

## 2018-05-19 ENCOUNTER — Ambulatory Visit: Admit: 2018-05-19 | Discharge: 2018-05-20 | Payer: PRIVATE HEALTH INSURANCE

## 2018-05-19 DIAGNOSIS — N186 End stage renal disease: Secondary | ICD-10-CM

## 2018-05-19 DIAGNOSIS — Z01818 Encounter for other preprocedural examination: Secondary | ICD-10-CM

## 2018-05-23 ENCOUNTER — Other Ambulatory Visit: Payer: Self-pay

## 2018-05-23 ENCOUNTER — Inpatient Hospital Stay
Admission: EM | Admit: 2018-05-23 | Discharge: 2018-05-25 | DRG: 371 | Disposition: A | Discharge status: Home / Self Care | Payer: BLUE CROSS/BLUE SHIELD | Attending: Internal Medicine | Admitting: Internal Medicine

## 2018-05-23 ENCOUNTER — Emergency Department: Payer: BLUE CROSS/BLUE SHIELD

## 2018-05-23 DIAGNOSIS — D539 Nutritional anemia, unspecified: Secondary | ICD-10-CM | POA: Diagnosis present

## 2018-05-23 DIAGNOSIS — Z7952 Long term (current) use of systemic steroids: Secondary | ICD-10-CM

## 2018-05-23 DIAGNOSIS — K429 Umbilical hernia without obstruction or gangrene: Secondary | ICD-10-CM | POA: Diagnosis present

## 2018-05-23 DIAGNOSIS — R748 Abnormal levels of other serum enzymes: Secondary | ICD-10-CM | POA: Diagnosis present

## 2018-05-23 DIAGNOSIS — F17210 Nicotine dependence, cigarettes, uncomplicated: Secondary | ICD-10-CM | POA: Diagnosis present

## 2018-05-23 DIAGNOSIS — N186 End stage renal disease: Secondary | ICD-10-CM | POA: Diagnosis present

## 2018-05-23 DIAGNOSIS — D72829 Elevated white blood cell count, unspecified: Secondary | ICD-10-CM | POA: Diagnosis present

## 2018-05-23 DIAGNOSIS — R109 Unspecified abdominal pain: Secondary | ICD-10-CM | POA: Diagnosis present

## 2018-05-23 DIAGNOSIS — E872 Acidosis: Secondary | ICD-10-CM | POA: Diagnosis present

## 2018-05-23 DIAGNOSIS — T380X5A Adverse effect of glucocorticoids and synthetic analogues, initial encounter: Secondary | ICD-10-CM | POA: Diagnosis present

## 2018-05-23 DIAGNOSIS — N2581 Secondary hyperparathyroidism of renal origin: Secondary | ICD-10-CM | POA: Diagnosis present

## 2018-05-23 DIAGNOSIS — R1031 Right lower quadrant pain: Secondary | ICD-10-CM | POA: Diagnosis not present

## 2018-05-23 DIAGNOSIS — I120 Hypertensive chronic kidney disease with stage 5 chronic kidney disease or end stage renal disease: Secondary | ICD-10-CM | POA: Diagnosis present

## 2018-05-23 DIAGNOSIS — I959 Hypotension, unspecified: Secondary | ICD-10-CM | POA: Diagnosis not present

## 2018-05-23 DIAGNOSIS — D631 Anemia in chronic kidney disease: Secondary | ICD-10-CM | POA: Diagnosis present

## 2018-05-23 DIAGNOSIS — Z79899 Other long term (current) drug therapy: Secondary | ICD-10-CM

## 2018-05-23 DIAGNOSIS — G4730 Sleep apnea, unspecified: Secondary | ICD-10-CM | POA: Diagnosis present

## 2018-05-23 DIAGNOSIS — Z992 Dependence on renal dialysis: Secondary | ICD-10-CM

## 2018-05-23 DIAGNOSIS — K659 Peritonitis, unspecified: Secondary | ICD-10-CM | POA: Diagnosis not present

## 2018-05-23 DIAGNOSIS — Z9049 Acquired absence of other specified parts of digestive tract: Secondary | ICD-10-CM

## 2018-05-23 DIAGNOSIS — N057 Unspecified nephritic syndrome with diffuse crescentic glomerulonephritis: Secondary | ICD-10-CM | POA: Diagnosis present

## 2018-05-23 LAB — CBC
HEMATOCRIT: 25.1 % — AB (ref 39.0–52.0)
Hemoglobin: 7.7 g/dL — ABNORMAL LOW (ref 13.0–17.0)
MCH: 30.9 pg (ref 26.0–34.0)
MCHC: 30.7 g/dL (ref 30.0–36.0)
MCV: 100.8 fL — AB (ref 80.0–100.0)
Platelets: 171 10*3/uL (ref 150–400)
RBC: 2.49 MIL/uL — ABNORMAL LOW (ref 4.22–5.81)
RDW: 17.4 % — ABNORMAL HIGH (ref 11.5–15.5)
WBC: 14.9 10*3/uL — ABNORMAL HIGH (ref 4.0–10.5)
nRBC: 1.4 % — ABNORMAL HIGH (ref 0.0–0.2)

## 2018-05-23 NOTE — ED Notes (Signed)
Patient returned from xray.

## 2018-05-23 NOTE — ED Provider Notes (Signed)
[facility] Emergency Department Provider Note  ____________________________________________   First MD Initiated Contact with Patient 05/23/18 2301     (approximate)  I have reviewed the triage vital signs and the nursing notes.   HISTORY  Chief Complaint Abdominal Pain and Chest Pain   HPI Jeffrey Holloway is a 32 y.o. male who self presents to the emergency department with 3 days of diffuse abdominal pain.  The patient has a complex past medical history including end-stage renal disease for which he performs peritoneal dialysis daily.  He denies fevers or chills.  His symptoms have been slowly progressive over the past 3 days and tonight it became severe which prompted the visit.  He has sharp aching pain "everywhere" in his abdomen.  The pain is worse with movement twisting bending or stooping.  It is somewhat improved when lying flat.  He does have a remote appendectomy although no other abdominal surgeries.  The pain does not seem to radiate.    Past Medical History:  Diagnosis Date  . Constipation   . Diarrhea   . Glomerulonephritis   . Hemorrhoids   . Kidney infection   . Nausea and vomiting   . Occult blood in stools   . Shortness of breath   . Sleep apnea     Patient Active Problem List   Diagnosis Date Noted  . Abdominal pain 05/24/2018  . Hypertensive urgency, malignant 03/31/2018  . End stage renal disease ([place]) 01/22/2018  . Essential hypertension 01/22/2018  . Cellulitis 11/25/2017  . 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 GERD (gastroesophageal reflux disease) 10/10/2017  . Rectal foreign body     Past Surgical History:  Procedure Laterality Date  . APPENDECTOMY    . DIALYSIS/PERMA CATHETER INSERTION N/A 10/15/2017   Procedure: DIALYSIS/PERMA CATHETER INSERTION;  Surgeon: Katha Cabal, MD;  Location: [facility];  Service: Cardiovascular;  Laterality: N/A;  . DIALYSIS/PERMA CATHETER REMOVAL N/A 05/12/2018   Procedure: DIALYSIS/PERMA CATHETER REMOVAL;  Surgeon: Algernon Huxley, MD;  Location: [facility];  Service: Cardiovascular;  Laterality: N/A;    Prior to Admission medications   Medication Sig Start Date End Date Taking? Authorizing Provider  ALPRAZolam Duanne Moron) 0.25 MG tablet Take 1 tablet by mouth 2 (two) times daily. 05/02/18  Yes [provider]  amLODipine (NORVASC) 10 MG tablet Take 1 tablet (10 mg total) by mouth daily. 10/18/17  Yes Epifanio Lesches, MD  calcium acetate (PHOSLO) 667 MG capsule Take 2 capsules (1,334 mg total) by mouth 3 (three) times daily with meals. 10/18/17  Yes Epifanio Lesches, MD  calcium carbonate (TUMS - DOSED IN MG ELEMENTAL CALCIUM) 500 MG chewable tablet Chew 1 tablet by mouth 2 (two) times daily.   Yes [provider]  furosemide (LASIX) 80 MG tablet Take 1 tablet by mouth daily. 04/29/18  Yes [provider]  isosorbide mononitrate (IMDUR) 30 MG 24 hr tablet Take 1 tablet (30 mg total) by mouth daily. 04/04/18  Yes Hillary Bow, MD  losartan (COZAAR) 100 MG tablet Take 1 tablet (100 mg total) by mouth daily. 10/18/17  Yes Epifanio Lesches, MD  metoprolol tartrate (LOPRESSOR) 50 MG tablet Take 1 tablet (50 mg total) by mouth 2 (two) times daily. 04/04/18  Yes Gladstone Lighter, MD  MINOXIDIL PO Take by mouth.   Yes [provider]  multivitamin (RENA-VIT) TABS tablet Take 1 tablet by mouth at bedtime. 10/18/17  Yes Epifanio Lesches, MD  Nutritional Supplements (FEEDING SUPPLEMENT, NEPRO CARB  STEADY,) LIQD Take 237 mLs by mouth 2 (two) times daily between meals. 10/18/17  Yes Epifanio Lesches, MD  omeprazole (PRILOSEC) 20 MG capsule Take 20 mg by mouth daily.   Yes [provider]  predniSONE (DELTASONE) 20 MG tablet Take 1.5 tablets (30 mg total) by mouth daily with breakfast. Patient taking differently: Take 20 mg by mouth daily with breakfast.  10/19/17  Yes Epifanio Lesches, MD  rOPINIRole  (REQUIP) 0.5 MG tablet Take 1 tablet by mouth at bedtime. 03/21/18  Yes [provider]  cloNIDine (CATAPRES) 0.2 MG tablet Take 1 tablet (0.2 mg total) by mouth 2 (two) times daily. 05/25/18   Epifanio Lesches, MD  oxyCODONE-acetaminophen (PERCOCET) 5-325 MG tablet Take 1 tablet by mouth every 4 (four) hours as needed for moderate pain or severe pain. 05/25/18 05/25/19  Epifanio Lesches, MD  sulfamethoxazole-trimethoprim (BACTRIM) 400-80 MG tablet Take 1 tablet by mouth daily. 05/25/18   Epifanio Lesches, MD    Allergies Patient has no known allergies.  Family History  Problem Relation Age of Onset  . Varicose Veins Neg Hx   . Vision loss Neg Hx   . Stroke Neg Hx     Social History Social History   Tobacco Use  . Smoking status: Current Every Day Smoker    Packs/day: 0.25    Types: Cigarettes  . Smokeless tobacco: Never Used  Substance Use Topics  . Alcohol use: Yes  . Drug use: Yes    Types: Marijuana, Cocaine    Review of Systems Constitutional: No fever/chills Eyes: No visual changes. ENT: No sore throat. Cardiovascular: Denies chest pain. Respiratory: Denies shortness of breath. Gastrointestinal: Positive for abdominal pain.  No nausea, no vomiting.  No diarrhea.  No constipation. Genitourinary: Negative for dysuria. Musculoskeletal: Negative for back pain. Skin: Negative for rash. Neurological: Negative for headaches, focal weakness or numbness.   ____________________________________________   PHYSICAL EXAM:  VITAL SIGNS: ED Triage Vitals [05/23/18 2236]  Enc Vitals Group     BP 108/65     Pulse Rate 69     Resp 18     Temp 97.7 F (36.5 C)     Temp Source Oral     SpO2 95 %     Weight 160 lb (72.6 kg)     Height 6' (1.829 m)     Head Circumference      Peak Flow      Pain Score 8     Pain Loc      Pain Edu?      Excl. in [place]?     Constitutional: Alert and oriented x4 tearful and very uncomfortable appearing Eyes: PERRL  EOMI. Head: Atraumatic. Nose: No congestion/rhinnorhea. Mouth/Throat: No trismus Neck: No stridor.   Cardiovascular: Normal rate, regular rhythm. Grossly normal heart sounds.  Good peripheral circulation. Respiratory: Normal respiratory effort.  No retractions. Lungs CTAB and moving good air Gastrointestinal: Exquisite discomfort with frank peritonitis in all 4 quadrants.  Moaning in pain with even light percussion Musculoskeletal: No lower extremity edema   Neurologic:  Normal speech and language. No gross focal neurologic deficits are appreciated. Skin:  Skin is warm, dry and intact. No rash noted. Psychiatric: Mood and affect are normal. Speech and behavior are normal.    ____________________________________________   DIFFERENTIAL includes but not limited to  Peritonitis, sepsis, bowel obstruction ____________________________________________   LABS (all labs ordered are listed, but only abnormal results are displayed)  Labs Reviewed  BASIC METABOLIC PANEL - Abnormal; Notable for the  following components:      Result Value   Chloride 97 (*)    CO2 21 (*)    BUN 99 (*)    Creatinine, Ser 16.60 (*)    Calcium 7.9 (*)    GFR calc non Af Amer 3 (*)    GFR calc Af Amer 4 (*)    Anion gap 22 (*)    All other components within normal limits  CBC - Abnormal; Notable for the following components:   WBC 14.9 (*)    RBC 2.49 (*)    Hemoglobin 7.7 (*)    HCT 25.1 (*)    MCV 100.8 (*)    RDW 17.4 (*)    nRBC 1.4 (*)    All other components within normal limits  TROPONIN I - Abnormal; Notable for the following components:   Troponin I 0.05 (*)    All other components within normal limits  LIPASE, BLOOD - Abnormal; Notable for the following components:   Lipase 192 (*)    All other components within normal limits  HEPATIC FUNCTION PANEL - Abnormal; Notable for the following components:   Total Protein 6.2 (*)    AST 12 (*)    Alkaline Phosphatase 34 (*)    All other  components within normal limits  PHOSPHORUS - Abnormal; Notable for the following components:   Phosphorus 11.2 (*)    All other components within normal limits  BODY FLUID CELL COUNT WITH DIFFERENTIAL - Abnormal; Notable for the following components:   Color, Fluid YELLOW (*)    All other components within normal limits  CK - Abnormal; Notable for the following components:   Total CK 30 (*)    All other components within normal limits  BASIC METABOLIC PANEL - Abnormal; Notable for the following components:   Sodium 134 (*)    Chloride 93 (*)    CO2 21 (*)    BUN 100 (*)    Creatinine, Ser 15.53 (*)    Calcium 7.4 (*)    GFR calc non Af Amer 4 (*)    GFR calc Af Amer 4 (*)    Anion gap 20 (*)    All other components within normal limits  CBC - Abnormal; Notable for the following components:   WBC 12.8 (*)    RBC 2.80 (*)    Hemoglobin 8.7 (*)    HCT 27.4 (*)    RDW 17.7 (*)    nRBC 0.5 (*)    All other components within normal limits  MRSA PCR SCREENING  BODY FLUID CULTURE  MAGNESIUM  VITAMIN B12  FOLATE  CALCIUM, IONIZED  PARATHYROID HORMONE, INTACT (NO CA)  VITAMIN D 25 HYDROXY (VIT D DEFICIENCY, FRACTURES)    Lab work reviewed by me shows elevated white count which is nonspecific and could be secondary to pain versus infection.  His hemoglobin has drifted somewhat down and is now 7.7 __________________________________________  EKG  ED ECG REPORT I, Darel Hong, the attending physician, personally viewed and interpreted this ECG.  Date: 05/26/2018 EKG Time:  Rate: 69 Rhythm: normal sinus rhythm QRS Axis: normal Intervals: normal ST/T Wave abnormalities: normal Narrative Interpretation: no evidence of acute ischemia  ____________________________________________  RADIOLOGY  X-ray reviewed by me with no acute disease ____________________________________________   PROCEDURES  Procedure(s) performed: no  Procedures  Critical Care performed:  no  ____________________________________________   INITIAL IMPRESSION / ASSESSMENT AND PLAN / ED COURSE  Pertinent labs & imaging results that were available during my  care of the patient were reviewed by me and considered in my medical decision making (see chart for details).   As part of my medical decision making, I reviewed the following data within the [place] History obtained from family if available, nursing notes, old chart and ekg, as well as notes from prior ED visits.  The patient comes to the emergency department with 3 days of worsening abdominal pain.  He is afebrile although does have a white count.  He has diffuse peritonitis which in the setting of peritoneal dialysis is concerning for a localized infection.  I have given the patient 100 mcg of IV fentanyl with some improvement in his symptoms and 1 g of ceftriaxone for presumptive intra-abdominal infection.  The patient has his dialysis distal it in his abdomen right now and will contact the dialysis nurse to remove the sample and send off a culture to evaluate for spontaneous bacterial peritonitis.  At this point given the patient's pain and my concern over intra-abdominal infection he requires inpatient admission for continued IV pain control and management.  I discussed with the hospitalist who verbalized understanding and agreement with the plan.      ____________________________________________   FINAL CLINICAL IMPRESSION(S) / ED DIAGNOSES  Final diagnoses:  Peritonitis ([place])      NEW MEDICATIONS STARTED DURING THIS VISIT:  Discharge Medication List as of 05/25/2018  2:22 PM    START taking these medications   Details  oxyCODONE-acetaminophen (PERCOCET) 5-325 MG tablet Take 1 tablet by mouth every 4 (four) hours as needed for moderate pain or severe pain., Starting Sun 05/25/2018, Until Mon 05/25/2019, Print    sulfamethoxazole-trimethoprim (BACTRIM) 400-80 MG tablet Take 1 tablet by mouth  daily., Starting Sun 05/25/2018, Normal         Note:  This document was prepared using Dragon voice recognition software and may include unintentional dictation errors.    Darel Hong, MD 05/26/18 0830

## 2018-05-23 NOTE — ED Notes (Signed)
Patient transported to X-ray 

## 2018-05-24 ENCOUNTER — Inpatient Hospital Stay: Payer: BLUE CROSS/BLUE SHIELD

## 2018-05-24 ENCOUNTER — Encounter: Payer: Self-pay | Admitting: Internal Medicine

## 2018-05-24 DIAGNOSIS — G4730 Sleep apnea, unspecified: Secondary | ICD-10-CM | POA: Diagnosis present

## 2018-05-24 DIAGNOSIS — E872 Acidosis: Secondary | ICD-10-CM | POA: Diagnosis present

## 2018-05-24 DIAGNOSIS — Z992 Dependence on renal dialysis: Secondary | ICD-10-CM | POA: Diagnosis not present

## 2018-05-24 DIAGNOSIS — D631 Anemia in chronic kidney disease: Secondary | ICD-10-CM | POA: Diagnosis present

## 2018-05-24 DIAGNOSIS — Z7952 Long term (current) use of systemic steroids: Secondary | ICD-10-CM | POA: Diagnosis not present

## 2018-05-24 DIAGNOSIS — N057 Unspecified nephritic syndrome with diffuse crescentic glomerulonephritis: Secondary | ICD-10-CM | POA: Diagnosis present

## 2018-05-24 DIAGNOSIS — T380X5A Adverse effect of glucocorticoids and synthetic analogues, initial encounter: Secondary | ICD-10-CM | POA: Diagnosis present

## 2018-05-24 DIAGNOSIS — F17210 Nicotine dependence, cigarettes, uncomplicated: Secondary | ICD-10-CM | POA: Diagnosis present

## 2018-05-24 DIAGNOSIS — R109 Unspecified abdominal pain: Secondary | ICD-10-CM | POA: Diagnosis present

## 2018-05-24 DIAGNOSIS — N2581 Secondary hyperparathyroidism of renal origin: Secondary | ICD-10-CM | POA: Diagnosis present

## 2018-05-24 DIAGNOSIS — N186 End stage renal disease: Secondary | ICD-10-CM | POA: Diagnosis present

## 2018-05-24 DIAGNOSIS — D539 Nutritional anemia, unspecified: Secondary | ICD-10-CM | POA: Diagnosis present

## 2018-05-24 DIAGNOSIS — Z79899 Other long term (current) drug therapy: Secondary | ICD-10-CM | POA: Diagnosis not present

## 2018-05-24 DIAGNOSIS — Z9049 Acquired absence of other specified parts of digestive tract: Secondary | ICD-10-CM | POA: Diagnosis not present

## 2018-05-24 DIAGNOSIS — K429 Umbilical hernia without obstruction or gangrene: Secondary | ICD-10-CM | POA: Diagnosis present

## 2018-05-24 DIAGNOSIS — R748 Abnormal levels of other serum enzymes: Secondary | ICD-10-CM | POA: Diagnosis present

## 2018-05-24 DIAGNOSIS — I120 Hypertensive chronic kidney disease with stage 5 chronic kidney disease or end stage renal disease: Secondary | ICD-10-CM | POA: Diagnosis present

## 2018-05-24 DIAGNOSIS — K659 Peritonitis, unspecified: Secondary | ICD-10-CM | POA: Diagnosis present

## 2018-05-24 DIAGNOSIS — R1031 Right lower quadrant pain: Secondary | ICD-10-CM | POA: Diagnosis present

## 2018-05-24 DIAGNOSIS — I959 Hypotension, unspecified: Secondary | ICD-10-CM | POA: Diagnosis not present

## 2018-05-24 DIAGNOSIS — D72829 Elevated white blood cell count, unspecified: Secondary | ICD-10-CM | POA: Diagnosis present

## 2018-05-24 LAB — MAGNESIUM: Magnesium: 1.7 mg/dL (ref 1.7–2.4)

## 2018-05-24 LAB — BODY FLUID CELL COUNT WITH DIFFERENTIAL
Eos, Fluid: 0 %
LYMPHS FL: 31 %
Monocyte-Macrophage-Serous Fluid: 64 %
Neutrophil Count, Fluid: 5 %
Total Nucleated Cell Count, Fluid: 33 cu mm

## 2018-05-24 LAB — HEPATIC FUNCTION PANEL
ALT: 9 U/L (ref 0–44)
AST: 12 U/L — ABNORMAL LOW (ref 15–41)
Albumin: 3.5 g/dL (ref 3.5–5.0)
Alkaline Phosphatase: 34 U/L — ABNORMAL LOW (ref 38–126)
Bilirubin, Direct: <0.1 mg/dL (ref 0.0–0.2)
Total Bilirubin: 0.7 mg/dL (ref 0.3–1.2)
Total Protein: 6.2 g/dL — ABNORMAL LOW (ref 6.5–8.1)

## 2018-05-24 LAB — BASIC METABOLIC PANEL
Anion gap: 22 — ABNORMAL HIGH (ref 5–15)
BUN: 99 mg/dL — ABNORMAL HIGH (ref 6–20)
CO2: 21 mmol/L — ABNORMAL LOW (ref 22–32)
Calcium: 7.9 mg/dL — ABNORMAL LOW (ref 8.9–10.3)
Chloride: 97 mmol/L — ABNORMAL LOW (ref 98–111)
Creatinine, Ser: 16.6 mg/dL — ABNORMAL HIGH (ref 0.61–1.24)
GFR calc Af Amer: 4 mL/min — ABNORMAL LOW (ref >60)
GFR calc non Af Amer: 3 mL/min — ABNORMAL LOW (ref >60)
Glucose, Bld: 99 mg/dL (ref 70–99)
Potassium: 4.4 mmol/L (ref 3.5–5.1)
Sodium: 140 mmol/L (ref 135–145)

## 2018-05-24 LAB — LIPASE, BLOOD: Lipase: 192 U/L — ABNORMAL HIGH (ref 11–51)

## 2018-05-24 LAB — MRSA PCR SCREENING: MRSA by PCR: NEGATIVE

## 2018-05-24 LAB — CK: Total CK: 30 U/L — ABNORMAL LOW (ref 49–397)

## 2018-05-24 LAB — FOLATE: Folate: 13.8 ng/mL (ref >5.9)

## 2018-05-24 LAB — PHOSPHORUS: Phosphorus: 11.2 mg/dL — ABNORMAL HIGH (ref 2.5–4.6)

## 2018-05-24 LAB — TROPONIN I: Troponin I: 0.05 ng/mL (ref <0.03)

## 2018-05-24 LAB — VITAMIN B12: VITAMIN B 12: 565 pg/mL (ref 180–914)

## 2018-05-24 MED ORDER — PANTOPRAZOLE SODIUM 40 MG PO TBEC
40.0000 mg | DELAYED_RELEASE_TABLET | Freq: Every day | ORAL | Status: DC
  Administered 2018-05-24 – 2018-05-25 (×2): 40 mg via ORAL
  Filled 2018-05-24 (×2): qty 1

## 2018-05-24 MED ORDER — FUROSEMIDE 40 MG PO TABS
80.0000 mg | ORAL_TABLET | Freq: Every day | ORAL | Status: DC
  Administered 2018-05-24 – 2018-05-25 (×2): 80 mg via ORAL
  Filled 2018-05-24 (×2): qty 2

## 2018-05-24 MED ORDER — DELFLEX-LC/1.5% DEXTROSE 344 MOSM/L IP SOLN
INTRAPERITONEAL | Status: DC
  Administered 2018-05-24: 10 L via INTRAPERITONEAL
  Filled 2018-05-24 (×3): qty 6000

## 2018-05-24 MED ORDER — ACETAMINOPHEN 325 MG PO TABS
650.0000 mg | ORAL_TABLET | Freq: Four times a day (QID) | ORAL | Status: DC | PRN
  Administered 2018-05-24: 650 mg via ORAL
  Filled 2018-05-24: qty 2

## 2018-05-24 MED ORDER — ACETAMINOPHEN 650 MG RE SUPP: 650.0000 mg | Freq: Four times a day (QID) | RECTAL | Status: DC | PRN

## 2018-05-24 MED ORDER — METOPROLOL TARTRATE 50 MG PO TABS
50.0000 mg | ORAL_TABLET | Freq: Two times a day (BID) | ORAL | Status: DC
  Administered 2018-05-24 – 2018-05-25 (×3): 50 mg via ORAL
  Filled 2018-05-24 (×3): qty 1

## 2018-05-24 MED ORDER — METAXALONE 800 MG PO TABS
400.0000 mg | ORAL_TABLET | Freq: Three times a day (TID) | ORAL | Status: DC
  Administered 2018-05-24 – 2018-05-25 (×3): 400 mg via ORAL
  Filled 2018-05-24 (×6): qty 0.5

## 2018-05-24 MED ORDER — CALCIUM ACETATE (PHOS BINDER) 667 MG PO CAPS
1334.0000 mg | ORAL_CAPSULE | Freq: Three times a day (TID) | ORAL | Status: DC
  Administered 2018-05-24 – 2018-05-25 (×5): 1334 mg via ORAL
  Filled 2018-05-24 (×5): qty 2

## 2018-05-24 MED ORDER — SENNOSIDES-DOCUSATE SODIUM 8.6-50 MG PO TABS: 1.0000 | ORAL_TABLET | Freq: Every evening | ORAL | Status: DC | PRN

## 2018-05-24 MED ORDER — IOPAMIDOL (ISOVUE-300) INJECTION 61%
15.0000 mL | INTRAVENOUS | Status: AC
  Administered 2018-05-24 (×2): 15 mL via ORAL

## 2018-05-24 MED ORDER — VANCOMYCIN HCL IN DEXTROSE 1-5 GM/200ML-% IV SOLN
1000.0000 mg | INTRAVENOUS | Status: AC
  Administered 2018-05-24 (×2): 1000 mg via INTRAVENOUS
  Filled 2018-05-24 (×2): qty 200

## 2018-05-24 MED ORDER — VANCOMYCIN VARIABLE DOSE PER UNSTABLE RENAL FUNCTION (PHARMACIST DOSING): Status: DC

## 2018-05-24 MED ORDER — RENA-VITE PO TABS
1.0000 | ORAL_TABLET | Freq: Every day | ORAL | Status: DC
  Administered 2018-05-24: 1 via ORAL
  Filled 2018-05-24: qty 1

## 2018-05-24 MED ORDER — SODIUM CHLORIDE 0.9 % IV SOLN
1.0000 g | Freq: Once | INTRAVENOUS | Status: AC
  Administered 2018-05-24: 1 g via INTRAVENOUS
  Filled 2018-05-24: qty 10

## 2018-05-24 MED ORDER — LOSARTAN POTASSIUM 50 MG PO TABS
100.0000 mg | ORAL_TABLET | Freq: Every day | ORAL | Status: DC
  Administered 2018-05-24: 100 mg via ORAL
  Filled 2018-05-24: qty 2

## 2018-05-24 MED ORDER — BISACODYL 5 MG PO TBEC: 5.0000 mg | DELAYED_RELEASE_TABLET | Freq: Every day | ORAL | Status: DC | PRN

## 2018-05-24 MED ORDER — HYDRALAZINE HCL 50 MG PO TABS: 100.0000 mg | ORAL_TABLET | Freq: Three times a day (TID) | ORAL | Status: DC

## 2018-05-24 MED ORDER — ISOSORBIDE MONONITRATE ER 30 MG PO TB24
30.0000 mg | ORAL_TABLET | Freq: Every day | ORAL | Status: DC
  Administered 2018-05-24 – 2018-05-25 (×2): 30 mg via ORAL
  Filled 2018-05-24 (×2): qty 1

## 2018-05-24 MED ORDER — HEPARIN SODIUM (PORCINE) 5000 UNIT/ML IJ SOLN
5000.0000 [IU] | Freq: Three times a day (TID) | INTRAMUSCULAR | Status: DC
  Administered 2018-05-24 – 2018-05-25 (×4): 5000 [IU] via SUBCUTANEOUS
  Filled 2018-05-24 (×4): qty 1

## 2018-05-24 MED ORDER — DELFLEX-LC/1.5% DEXTROSE 344 MOSM/L IP SOLN: INTRAPERITONEAL | Status: DC

## 2018-05-24 MED ORDER — HEPARIN 1000 UNIT/ML FOR PERITONEAL DIALYSIS: 500.0000 [IU] | INTRAMUSCULAR | Status: DC | PRN

## 2018-05-24 MED ORDER — VANCOMYCIN HCL IN DEXTROSE 1-5 GM/200ML-% IV SOLN: 1000.0000 mg | Freq: Once | INTRAVENOUS | Status: DC

## 2018-05-24 MED ORDER — CALCIUM CARBONATE ANTACID 500 MG PO CHEW
1.0000 | CHEWABLE_TABLET | Freq: Two times a day (BID) | ORAL | Status: DC
  Administered 2018-05-24 – 2018-05-25 (×3): 200 mg via ORAL
  Filled 2018-05-24 (×3): qty 1

## 2018-05-24 MED ORDER — SODIUM CHLORIDE 0.9 % IV SOLN
1.0000 g | INTRAVENOUS | Status: DC
  Administered 2018-05-25: 1 g via INTRAVENOUS
  Filled 2018-05-24: qty 10
  Filled 2018-05-24: qty 1

## 2018-05-24 MED ORDER — PREDNISONE 20 MG PO TABS
20.0000 mg | ORAL_TABLET | Freq: Every day | ORAL | Status: DC
  Administered 2018-05-24 – 2018-05-25 (×2): 20 mg via ORAL
  Filled 2018-05-24 (×2): qty 1

## 2018-05-24 MED ORDER — HYDROMORPHONE HCL 1 MG/ML IJ SOLN
0.5000 mg | INTRAMUSCULAR | Status: DC | PRN
  Administered 2018-05-24 – 2018-05-25 (×8): 0.5 mg via INTRAVENOUS
  Filled 2018-05-24 (×8): qty 0.5

## 2018-05-24 MED ORDER — GENTAMICIN SULFATE 0.1 % EX CREA
1.0000 "application " | TOPICAL_CREAM | Freq: Every day | CUTANEOUS | Status: DC
  Administered 2018-05-24: 1 via TOPICAL
  Filled 2018-05-24: qty 15

## 2018-05-24 MED ORDER — ALPRAZOLAM 0.25 MG PO TABS
0.2500 mg | ORAL_TABLET | Freq: Two times a day (BID) | ORAL | Status: DC
  Administered 2018-05-24 – 2018-05-25 (×3): 0.25 mg via ORAL
  Filled 2018-05-24 (×3): qty 1

## 2018-05-24 MED ORDER — ONDANSETRON HCL 4 MG/2ML IJ SOLN: 4.0000 mg | Freq: Four times a day (QID) | INTRAMUSCULAR | Status: DC | PRN

## 2018-05-24 MED ORDER — FENTANYL CITRATE (PF) 100 MCG/2ML IJ SOLN
100.0000 ug | Freq: Once | INTRAMUSCULAR | Status: AC
  Administered 2018-05-24: 100 ug via INTRAVENOUS
  Filled 2018-05-24: qty 2

## 2018-05-24 MED ORDER — ROPINIROLE HCL 1 MG PO TABS
0.5000 mg | ORAL_TABLET | Freq: Every day | ORAL | Status: DC
  Administered 2018-05-24: 0.5 mg via ORAL
  Filled 2018-05-24: qty 1

## 2018-05-24 MED ORDER — CLONIDINE HCL 0.1 MG PO TABS: 0.3000 mg | ORAL_TABLET | Freq: Two times a day (BID) | ORAL | Status: DC

## 2018-05-24 MED ORDER — CLONIDINE HCL 0.1 MG PO TABS
0.2000 mg | ORAL_TABLET | Freq: Two times a day (BID) | ORAL | Status: DC
  Administered 2018-05-24 – 2018-05-25 (×3): 0.2 mg via ORAL
  Filled 2018-05-24 (×3): qty 2

## 2018-05-24 MED ORDER — NEPRO/CARBSTEADY PO LIQD
237.0000 mL | Freq: Two times a day (BID) | ORAL | Status: DC
  Administered 2018-05-24 – 2018-05-25 (×3): 237 mL via ORAL

## 2018-05-24 MED ORDER — ONDANSETRON HCL 4 MG PO TABS: 4.0000 mg | ORAL_TABLET | Freq: Four times a day (QID) | ORAL | Status: DC | PRN

## 2018-05-24 MED ORDER — LOSARTAN POTASSIUM 50 MG PO TABS: 100.0000 mg | ORAL_TABLET | Freq: Every day | ORAL | Status: DC

## 2018-05-24 MED ORDER — AMLODIPINE BESYLATE 10 MG PO TABS
10.0000 mg | ORAL_TABLET | Freq: Every day | ORAL | Status: DC
  Administered 2018-05-24 – 2018-05-25 (×2): 10 mg via ORAL
  Filled 2018-05-24 (×2): qty 1

## 2018-05-24 MED ORDER — EXTRANEAL 7.5 % IP SOLN
INTRAPERITONEAL | Status: DC
  Filled 2018-05-24 (×2): qty 2500

## 2018-05-24 MED ORDER — HEPARIN SODIUM (PORCINE) 1000 UNIT/ML IJ SOLN
INTRAPERITONEAL | Status: DC | PRN
  Filled 2018-05-24: qty 6000

## 2018-05-24 NOTE — H&P (Signed)
[facility] [place] NAME: Jeffrey Holloway    MR#:  [id number]  DATE OF BIRTH:  10-Aug-1985  DATE OF ADMISSION:  05/23/2018  PRIMARY CARE PHYSICIAN: Patient, No Pcp Per   REQUESTING/REFERRING PHYSICIAN: Darel Hong, MD  CHIEF COMPLAINT:   Chief Complaint  Patient presents with  . Abdominal Pain  . Chest Pain    HISTORY OF PRESENT ILLNESS:  Jeffrey Holloway  is a 32 y.o. 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 Pt well known to hospitalist service, multiple admissions since 10/2017 w/ renal issues, HTN. Pt tells me he was started on peritoneal dialysis shortly after his last hospital D/C (admitted 03/31/2018-04/04/2018). His hemodialysis catheter was removed earlier this month (05/2018). He states peritoneal dialysis has been going well up until ~4d ago, when he developed mild intermittent randomly-occurring twinges of sharp stabbing pain, migratory. He states the severity of pain has progressively increased since onset, and is worse w/ movement and position change. He subsequently developed chest congestion and chest discomfort. He endorses chills, but denies fever, diaphoresis and rigors. Denies N/V. States he still makes small qty urine, but denies urinary symptoms. States he infused Extraneal for the first time last night, though his symptoms preceded this. WBC 14.9, SIRS (-). Uncomfortable/unwell but does not appear toxic.  PAST MEDICAL HISTORY:   Past Medical History:  Diagnosis Date  . Constipation   . Diarrhea   . Glomerulonephritis   . Hemorrhoids   . Kidney infection   . Nausea and vomiting   . Occult blood in stools   . Shortness of breath   . Sleep apnea     PAST SURGICAL HISTORY:   Past Surgical History:  Procedure Laterality Date  . APPENDECTOMY    . DIALYSIS/PERMA CATHETER INSERTION N/A 10/15/2017   Procedure: DIALYSIS/PERMA CATHETER INSERTION;   Surgeon: Katha Cabal, MD;  Location: [facility];  Service: Cardiovascular;  Laterality: N/A;  . DIALYSIS/PERMA CATHETER REMOVAL N/A 05/12/2018   Procedure: DIALYSIS/PERMA CATHETER REMOVAL;  Surgeon: Algernon Huxley, MD;  Location: [facility];  Service: Cardiovascular;  Laterality: N/A;    SOCIAL HISTORY:   Social History   Tobacco Use  . Smoking status: Current Every Day Smoker    Packs/day: 0.25    Types: Cigarettes  . Smokeless tobacco: Never Used  Substance Use Topics  . Alcohol use: Yes    FAMILY HISTORY:   Family History  Problem Relation Age of Onset  . Varicose Veins Neg Hx   . Vision loss Neg Hx   . Stroke Neg Hx     DRUG ALLERGIES:  No Known Allergies  REVIEW OF SYSTEMS:   Review of Systems  Constitutional: Positive for chills. Negative for diaphoresis, fever, malaise/fatigue and weight loss.  HENT: Positive for congestion. Negative for ear pain, hearing loss, nosebleeds, sinus pain, sore throat and tinnitus.   Eyes: Negative for blurred vision, double vision and photophobia.  Respiratory: Negative for cough, hemoptysis, sputum production, shortness of breath and wheezing.   Cardiovascular: Positive for chest pain. Negative for palpitations, orthopnea, claudication, leg swelling and PND.  Gastrointestinal: Positive for abdominal pain. Negative for blood in stool, constipation, diarrhea, heartburn, melena, nausea and vomiting.  Genitourinary: Negative for dysuria, frequency, hematuria and urgency.  Musculoskeletal: Negative for back pain, joint pain, myalgias and neck pain.  Skin: Negative for itching and rash.  Neurological: Negative for dizziness,  tingling, tremors, sensory change, speech change, focal weakness, seizures, loss of consciousness, weakness and headaches.  Psychiatric/Behavioral: Negative for depression and memory loss. The patient is not nervous/anxious and does not have insomnia.    MEDICATIONS AT HOME:   Prior to  Admission medications   Medication Sig Start Date End Date Taking? Authorizing Provider  ALPRAZolam Duanne Moron) 0.25 MG tablet Take 1 tablet by mouth 2 (two) times daily. 05/02/18  Yes [provider]  amLODipine (NORVASC) 10 MG tablet Take 1 tablet (10 mg total) by mouth daily. 10/18/17  Yes Epifanio Lesches, MD  calcium acetate (PHOSLO) 667 MG capsule Take 2 capsules (1,334 mg total) by mouth 3 (three) times daily with meals. 10/18/17  Yes Epifanio Lesches, MD  calcium carbonate (TUMS - DOSED IN MG ELEMENTAL CALCIUM) 500 MG chewable tablet Chew 1 tablet by mouth 2 (two) times daily.   Yes [provider]  cloNIDine (CATAPRES) 0.2 MG tablet Take 1.5 tablets (0.3 mg total) by mouth 2 (two) times daily. 04/03/18  Yes Sudini, Alveta Heimlich, MD  furosemide (LASIX) 80 MG tablet Take 1 tablet by mouth daily. 04/29/18  Yes [provider]  hydrALAZINE (APRESOLINE) 100 MG tablet Take 1 tablet (100 mg total) by mouth 3 (three) times daily. 04/03/18  Yes Hillary Bow, MD  isosorbide mononitrate (IMDUR) 30 MG 24 hr tablet Take 1 tablet (30 mg total) by mouth daily. 04/04/18  Yes Hillary Bow, MD  losartan (COZAAR) 100 MG tablet Take 1 tablet (100 mg total) by mouth daily. 10/18/17  Yes Epifanio Lesches, MD  metoprolol tartrate (LOPRESSOR) 50 MG tablet Take 1 tablet (50 mg total) by mouth 2 (two) times daily. 04/04/18  Yes Gladstone Lighter, MD  MINOXIDIL PO Take by mouth.   Yes [provider]  multivitamin (RENA-VIT) TABS tablet Take 1 tablet by mouth at bedtime. 10/18/17  Yes Epifanio Lesches, MD  Nutritional Supplements (FEEDING SUPPLEMENT, NEPRO CARB STEADY,) LIQD Take 237 mLs by mouth 2 (two) times daily between meals. 10/18/17  Yes Epifanio Lesches, MD  omeprazole (PRILOSEC) 20 MG capsule Take 20 mg by mouth daily.   Yes [provider]  predniSONE (DELTASONE) 20 MG tablet Take 1.5 tablets (30 mg total) by mouth daily with breakfast. Patient taking  differently: Take 20 mg by mouth daily with breakfast.  10/19/17  Yes Epifanio Lesches, MD  rOPINIRole (REQUIP) 0.5 MG tablet Take 1 tablet by mouth at bedtime. 03/21/18  Yes [provider]      VITAL SIGNS:  Blood pressure 118/60, pulse 66, temperature 97.7 F (36.5 C), temperature source Oral, resp. rate 14, height 6' (1.829 m), weight 72.6 kg, SpO2 92 %.  PHYSICAL EXAMINATION:  Physical Exam Constitutional:      General: He is awake. He is not in acute distress.    Appearance: He is well-developed and normal weight. He is ill-appearing. He is not toxic-appearing or diaphoretic.     Interventions: He is not intubated. HENT:     Head: Normocephalic and atraumatic.  Eyes:     General: Lids are normal. No scleral icterus.    Extraocular Movements: Extraocular movements intact.     Conjunctiva/sclera: Conjunctivae normal.  Neck:     Musculoskeletal: Neck supple.  Cardiovascular:     Rate and Rhythm: Normal rate and regular rhythm.  No extrasystoles are present.    Heart sounds: Normal heart sounds, S1 normal and S2 normal. Heart sounds not distant. No murmur. No friction rub. No gallop. No S3 or S4 sounds.  Pulmonary:     Effort: Pulmonary effort is normal. No tachypnea, bradypnea, accessory muscle usage, prolonged expiration, respiratory distress or retractions. He is not intubated.     Breath sounds: Normal breath sounds. No stridor. No decreased breath sounds, wheezing, rhonchi or rales.  Abdominal:     General: Bowel sounds are decreased.     Tenderness: There is generalized abdominal tenderness. There is no guarding or rebound.  Musculoskeletal: Normal range of motion.        General: No swelling.     Right lower leg: No edema.     Left lower leg: No edema.  Lymphadenopathy:     Cervical: No cervical adenopathy.  Skin:    General: Skin is warm and dry.     Findings: No erythema or rash.  Neurological:     Mental Status: He is alert and oriented to person,  place, and time. Mental status is at baseline.  Psychiatric:        Attention and Perception: Attention and perception normal.        Mood and Affect: Mood and affect normal.        Speech: Speech normal.        Behavior: Behavior normal. Behavior is cooperative.        Thought Content: Thought content normal.        Cognition and Memory: Cognition and memory normal.        Judgment: Judgment normal.    LABORATORY PANEL:   CBC Recent Labs  Lab 05/23/18 2251  WBC 14.9*  HGB 7.7*  HCT 25.1*  PLT 171   ------------------------------------------------------------------------------------------------------------------  Chemistries  Recent Labs  Lab 05/23/18 2251  NA 140  K 4.4  CL 97*  CO2 21*  GLUCOSE 99  BUN 99*  CREATININE 16.60*  CALCIUM 7.9*  AST 12*  ALT 9  ALKPHOS 34*  BILITOT 0.7   ------------------------------------------------------------------------------------------------------------------  Cardiac Enzymes Recent Labs  Lab 05/23/18 2251  TROPONINI 0.05*   ------------------------------------------------------------------------------------------------------------------  RADIOLOGY:  Dg Chest 2 View  Result Date: 05/23/2018 CLINICAL DATA:  Chest pain EXAM: CHEST - 2 VIEW COMPARISON:  02/05/2018 FINDINGS: No focal opacity or pleural effusion. Mild bronchitic changes. Normal heart size. No pneumothorax. IMPRESSION: No active cardiopulmonary disease.  Mild bronchitic changes. Electronically Signed   By: Donavan Foil M.D.   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 Azotemia/ureamia, hypocalcemia, anion gap metabolic acidosis, lipase elevation, troponin elevation, macrocytic anemia. -AP, ESRD, azotemia/uremia, anion gap metabolic acidosis, leukocytosis, lipase elevation, suspected peritonitis: ESRD, on PD since 04/2018. p/w 4d Hx AP, progressively  worsening. Endorses chills. Typically runs elevated BP, but is normotensive on present admission. BUN 99, Cr 16.60, AGap 22. WBC 14.9, SIRS (-). Lipase 192. Ceftriaxone + Vancomycin. Nephrology consult. Intraperitoneal ABx infusion may be indicated. Pain ctrl. c/w renal meds. -CP, troponin elevation: Trop-I 0.05. EKG (-) STE. CP likely 2/2 uremia/azotemia. Trop-I elevation likely 2/2 impaired renal clearance (ESRD). -Hypocalcemia: Ionized calcium, phosphorus, PTHi, vitamin D pending. c/w renal meds. -Macrocytic anemia: B12, folate pending. -c/w other home meds/formulary subs as tolerated. -FEN/GI: Renal diet as tolerated. -DVT PPx: Heparin. -Code status: Full code. -Disposition: Admission, > 2 midnights.   All the records are reviewed and case discussed with ED provider. Management plans discussed with the patient, family and they are in agreement.  CODE STATUS: Full code.  TOTAL TIME TAKING CARE OF THIS PATIENT:  75 minutes.    Arta Silence M.D on 05/24/2018 at 2:03 AM  Between 7am to 6pm - Pager - [phone]  After 6pm go to [email or web address] - Proofreader  [facility]  Office  [phone]  CC: Primary care physician; Patient, No Pcp Per   Note: This dictation was prepared with Dragon dictation along with smaller phrase technology. Any transcriptional errors that result from this process are unintentional.

## 2018-05-24 NOTE — ED Notes (Signed)
Pharmacy tech at bedside 

## 2018-05-24 NOTE — Care Management (Signed)
Notified Dell Ponto, Dialysis Liaison from patient pathways of admission.

## 2018-05-24 NOTE — Progress Notes (Signed)
Chaplain responded to an OR for an AD. Pt was alert and oriented. Chaplain educated Pt on proxy and AD. Pt has a wife and want her to be her HCPOA. Pt will review and discuss with his family.   05/24/18 0900  Clinical Encounter Type  Visited With Patient  Visit Type Initial  Referral From Nurse  Spiritual Encounters  Spiritual Needs Brochure;Prayer

## 2018-05-24 NOTE — Progress Notes (Signed)
Peritoneal effluent drained. Approx. 2 Liters obtained.  Effluent was yellowish and clear with no cloudyness vusualized.  Dr Candiss Norse present during drain.

## 2018-05-24 NOTE — Progress Notes (Signed)
Pharmacy Antibiotic Note  Jeffrey Holloway is a 32 y.o. male admitted on 05/23/2018 with peritonitis patient has h/o ESRD and is on peritoneal dialysis each night for 9 hours.  Pharmacy has been consulted for vancomycin dosing.  Plan: Will given patient vanc 2g IV x 1  Will check a random post-PD level @ 12/25 @ 0500. Goal random < 20 - 25 mcg/mL If first random at goal will give vanc 1g IV qweek  Height: 6' (182.9 cm) Weight: 172 lb 6.4 oz (78.2 kg) IBW/kg (Calculated) : 77.6  Temp (24hrs), Avg:97.9 F (36.6 C), Min:97.7 F (36.5 C), Max:98.2 F (36.8 C)  Recent Labs  Lab 05/23/18 2251  WBC 14.9*  CREATININE 16.60*    Estimated Creatinine Clearance: 6.9 mL/min (A) (by C-G formula based on SCr of 16.6 mg/dL (H)).    No Known Allergies  Thank you for allowing pharmacy to be a part of this patient's care.  Tobie Lords, PharmD, BCPS Clinical Pharmacist 05/24/2018

## 2018-05-24 NOTE — Progress Notes (Signed)
[facility] [place] NAME: Jeffrey Holloway    MR#:  [id number]  DATE OF BIRTH:  13-Apr-1986  SUBJECTIVE:  CHIEF COMPLAINT:   Chief Complaint  Patient presents with  . Abdominal Pain  . Chest Pain  Admitted last night for abdominal pain, patient states that he has right lower quadrant abdominal pain going on for 2 weeks got worse yesterday, unable to bend, unable to even get out of the car, complains of right lower current abdominal pain 10 out of 10 severity, no associated nausea, vomiting, diarrhea.  Yesterday he had changed his dialysis prescription for the first time, noted to have hypotension, episode of chest pain, left shoulder numbness.  All the chest pain, numbness of the left hand resolved now.  His main complaint is abdominal pain now.  Abdominal pain mainly in the right lower quadrant and he feels like there is a small lump there.  REVIEW OF SYSTEMS:   ROS CONSTITUTIONAL: No fever, fatigue or weakness.  EYES: No blurred or double vision.  EARS, NOSE, AND THROAT: No tinnitus or ear pain.  RESPIRATORY: No cough, shortness of breath, wheezing or hemoptysis.  CARDIOVASCULAR: No chest pain, orthopnea, edema.  GASTROINTESTINAL: No nausea, vomiting, diarrhea, has abdominal pain. GENITOURINARY: No dysuria, hematuria.  ENDOCRINE: No polyuria, nocturia,  HEMATOLOGY: No anemia, easy bruising or bleeding SKIN: No rash or lesion. MUSCULOSKELETAL: No joint pain or arthritis.   NEUROLOGIC: No tingling, numbness, weakness.  PSYCHIATRY: No anxiety or depression.   DRUG ALLERGIES:  No Known Allergies  VITALS:  Blood pressure 123/81, pulse 74, temperature 98.2 F (36.8 C), temperature source Oral, resp. rate 20, height 6' (1.829 m), weight 78.2 kg, SpO2 95 %.  PHYSICAL EXAMINATION:  GENERAL:  32 y.o.-year-old patient lying in the bed with no acute distress.  EYES: Pupils equal, round, reactive to light and accommodation. No scleral  icterus. Extraocular muscles intact.  HEENT: Head atraumatic, normocephalic. Oropharynx and nasopharynx clear.  NECK:  Supple, no jugular venous distention. No thyroid enlargement, no tenderness.  LUNGS: Normal breath sounds bilaterally, no wheezing, rales,rhonchi or crepitation. No use of accessory muscles of respiration.  CARDIOVASCULAR: S1, S2 normal. No murmurs, rubs, or gallops.  ABDOMEN: Patient has tenderness in the right lower quadrant, small palpable area cutaneously in the same region. EXTREMITIES: No pedal edema, cyanosis, or clubbing.  NEUROLOGIC: Cranial nerves II through XII are intact. Muscle strength 5/5 in all extremities. Sensation intact. Gait not checked.  PSYCHIATRIC: The patient is alert and oriented x 3.  SKIN: No obvious rash, lesion, or ulcer.    LABORATORY PANEL:   CBC Recent Labs  Lab 05/23/18 2251  WBC 14.9*  HGB 7.7*  HCT 25.1*  PLT 171   ------------------------------------------------------------------------------------------------------------------  Chemistries  Recent Labs  Lab 05/23/18 2251 05/24/18 0312  NA 140  --   K 4.4  --   CL 97*  --   CO2 21*  --   GLUCOSE 99  --   BUN 99*  --   CREATININE 16.60*  --   CALCIUM 7.9*  --   MG  --  1.7  AST 12*  --   ALT 9  --   ALKPHOS 34*  --   BILITOT 0.7  --    ------------------------------------------------------------------------------------------------------------------  Cardiac Enzymes Recent Labs  Lab 05/23/18 2251  TROPONINI 0.05*   ------------------------------------------------------------------------------------------------------------------  RADIOLOGY:  Dg Chest 2 View  Result Date: 05/23/2018 CLINICAL DATA:  Chest pain EXAM: CHEST - 2  VIEW COMPARISON:  02/05/2018 FINDINGS: No focal opacity or pleural effusion. Mild bronchitic changes. Normal heart size. No pneumothorax. IMPRESSION: No active cardiopulmonary disease.  Mild bronchitic changes. Electronically Signed   By:  Donavan Foil M.D.   On: 05/23/2018 23:25    EKG:   Orders placed or performed during the hospital encounter of 05/23/18  . EKG 12-Lead  . EKG 12-Lead  . ED EKG within 10 minutes  . 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   #1 acute abdominal pain in the right lower quadrant: Likely muscular because patient states that his pain is worse with movement, bending, start Skelaxin, get limited ultrasound of right lower quadrant for evaluation of swelling, continue empiric antibiotics with Rocephin, peritoneal fluid analysis sent for cultures, likely infection he has fluid analysis from peritoneal fluid was clear.  #2 leukocytosis likely due to chronic steroid use. 3.  Left-sided chest pain, with minimal elevation troponins, elevated troponins likely secondary to uremia, renal failure no further chest pain, no EKG changes. 4.  Hypotension likely secondary to change of peritoneal dialysis prescription yesterday hold clonidine,norvasc. We will discharge tomorrow if the peritoneal fluid cultures are negative for infection.   All the records are reviewed and case discussed with Care Management/Social Workerr. Management plans discussed with the patient, family and they are in agreement.  CODE STATUS: Full code  TOTAL TIME TAKING CARE OF THIS PATIENT: 63minutes.   POSSIBLE D/C IN 1DAYS, DEPENDING ON CLINICAL CONDITION.   Epifanio Lesches M.D on 05/24/2018 at 10:41 AM  Between 7am to 6pm - Pager - [phone]  After 6pm go to [email or web address] - password [email or web address] [facility]  Office  [phone]  CC: Primary care physician; Patient, No Pcp Per   Note:  This dictation was prepared with Dragon dictation along with smaller phrase technology. Any transcriptional errors that result from this process are unintentional.

## 2018-05-24 NOTE — Progress Notes (Signed)
Jeffrey Holloway, [place] 05/24/18  Subjective:   Patient known to our practice from outpatient dialysis.  He currently does peritoneal dialysis.  He presents for right lower quadrant pain which has been going on for 3 to 4 days but got worse yesterday.  Activities like raising his head, getting out of the car exacerbate the pain.  He denies any nausea, vomiting or fever.  Denies problems with bowel movement.  No blood in the stool.  Able to void without any problem.  No dysuria.  He takes prednisone for posse immune glomerulonephritis. He was given empiric antibiotics in the emergency room yesterday.  Nephrology consult has been requested to evaluate for peritonitis and to continue his peritoneal dialysis.  Objective:  Vital signs in last 24 hours:  Temp:  [97.7 F (36.5 C)-98.2 F (36.8 C)] 98.2 F (36.8 C) (12/21 0503) Pulse Rate:  [60-69] 60 (12/21 0503) Resp:  [11-20] 20 (12/21 0503) BP: (101-125)/(56-71) 101/56 (12/21 0503) SpO2:  [90 %-95 %] 95 % (12/21 0503) Weight:  [72.6 kg-78.2 kg] 78.2 kg (12/21 0217)  Weight change:  Filed Weights   05/23/18 2236 05/24/18 0217  Weight: 72.6 kg 78.2 kg    Intake/Output:    Intake/Output Summary (Last 24 hours) at 05/24/2018 1002 Last data filed at 05/24/2018 0400 Gross per 24 hour  Intake 273.53 ml  Output -  Net 273.53 ml     Physical Exam: General:  Well-appearing, laying in the bed, no acute distress  HEENT  moist oral mucous membranes  Neck  supple  Pulm/lungs  normal breathing effort, clear to auscultation bilaterally  CVS/Heart  regular rhythm  Abdomen:   Right lower quadrant superficial tenderness, no tenderness significant midepigastric or flank region.  Bowel sounds present.  Tenderness is away from PD catheter exit site or tunnel.  Extremities:  No peripheral edema  Neurologic:  Alert, oriented  Skin:  No acute rashes  Access:  PD catheter       Basic Metabolic Panel:  Recent Labs   Lab 05/23/18 2251 05/24/18 0312  NA 140  --   K 4.4  --   CL 97*  --   CO2 21*  --   GLUCOSE 99  --   BUN 99*  --   CREATININE 16.60*  --   CALCIUM 7.9*  --   MG  --  1.7  PHOS  --  11.2*     CBC: Recent Labs  Lab 05/23/18 2251  WBC 14.9*  HGB 7.7*  HCT 25.1*  MCV 100.8*  PLT 171      Lab Results  Component Value Date   HEPBSAG Negative 10/10/2017   HEPBSAG Negative 10/10/2017   HEPBSAB Reactive 10/10/2017   HEPBIGM Negative 10/10/2017      Microbiology:  Recent Results (from the past 240 hour(s))  MRSA PCR Screening     Status: None   Collection Time: 05/24/18  2:29 AM  Result Value Ref Range Status   MRSA by PCR NEGATIVE NEGATIVE Final    Comment:        The GeneXpert MRSA Assay (FDA approved for NASAL specimens only), is one component of a comprehensive MRSA colonization surveillance program. It is not intended to diagnose MRSA infection nor to guide or monitor treatment for MRSA infections. Performed at [facility], [address] [place], [place] [address]     Coagulation Studies: No results for input(s): LABPROT, INR in the last 72 hours.  Urinalysis: No results for input(s):  COLORURINE, LABSPEC, [place], GLUCOSEU, HGBUR, BILIRUBINUR, KETONESUR, PROTEINUR, UROBILINOGEN, NITRITE, LEUKOCYTESUR in the last 72 hours.  Invalid input(s): APPERANCEUR    Imaging: Dg Chest 2 View  Result Date: 05/23/2018 CLINICAL DATA:  Chest pain EXAM: CHEST - 2 VIEW COMPARISON:  02/05/2018 FINDINGS: No focal opacity or pleural effusion. Mild bronchitic changes. Normal heart size. No pneumothorax. IMPRESSION: No active cardiopulmonary disease.  Mild bronchitic changes. Electronically Signed   By: Donavan Foil M.D.   On: 05/23/2018 23:25     Medications:   . [START ON 05/25/2018] cefTRIAXone (ROCEPHIN)  IV    . dialysis solution 1.5% low-MG/low-CA     . ALPRAZolam  0.25 mg Oral BID  . amLODipine  10 mg Oral Daily  . calcium acetate  1,334  mg Oral TID WC  . calcium carbonate  1 tablet Oral BID  . cloNIDine  0.2 mg Oral BID  . extraneal (ICODEXTRIN) peritoneal dialysis solution   Intraperitoneal Q24H  . feeding supplement (NEPRO CARB STEADY)  237 mL Oral BID BM  . furosemide  80 mg Oral Daily  . gentamicin cream  1 application Topical Daily  . heparin  5,000 Units Subcutaneous Q8H  . isosorbide mononitrate  30 mg Oral Daily  . losartan  100 mg Oral QHS  . metaxalone  400 mg Oral TID  . metoprolol tartrate  50 mg Oral BID  . multivitamin  1 tablet Oral QHS  . pantoprazole  40 mg Oral Daily  . predniSONE  20 mg Oral Q breakfast  . rOPINIRole  0.5 mg Oral QHS  . vancomycin variable dose per unstable renal function (pharmacist dosing)   Does not apply See admin instructions   acetaminophen **OR** acetaminophen, bisacodyl, dianeal solution for CAPD/CCPD with heparin, HYDROmorphone (DILAUDID) injection, ondansetron **OR** ondansetron (ZOFRAN) IV, senna-docusate  Assessment/ Plan:  32 y.o. caucasian male with end stage renal disease on hemodialysis, hypertension, crescentic glomerulonephritis, who was admitted to [facility] for abdominal pain  CCPD/ Dr. Holley Raring 9 hrs/ 2 x 2500 cc/ last fill icodextrine 2000 cc  1.  End-stage renal disease 2.  Anemia chronic kidney disease 3.  Secondary hyperparathyroidism 4.  Abdominal pain 5.  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  Hypertension, severe with CKD  Plan: We will arrange for peritoneal dialysis starting tonight.  We will obtain peritoneal fluid WBC count and culture.  His PD fluid looks clear.  His abdominal pain is not diffuse.  No nausea or vomiting.  Less consistent with peritonitis but agree with empiric antibiotics for now, until cell count results are available.  Other differential includes musculoskeletal pain.  He has been started on Skelaxin empirically.  Also possibility of prednisone induced gastritis although pain is in the right lower quadrant and this  is less likely. Patient has severe hypertension.  After starting icodextrin recently, his blood pressure has been actually low.  He reported up to systolic blood pressure of 80 yesterday.  We will discontinue hydralazine for now.  Reduce the dose of clonidine to 0.2 mg  3 times a day.  Continue home dose of PhosLo  We will follow closely.   LOS: 0 Ingrid Shifrin 12/21/201910:02 AM  [place], [place]  Note: This note was prepared with Dragon dictation. Any transcription errors are unintentional

## 2018-05-24 NOTE — Progress Notes (Signed)
Pd started 

## 2018-05-24 NOTE — ED Notes (Signed)
ED TO INPATIENT HANDOFF REPORT  Name/Age/Gender Jeffrey Holloway 32 y.o. male  Code Status Code Status History    Date Active Date Inactive Code Status Order ID Comments User Context   03/31/2018 0805 04/04/2018 1644 Full Code [id number]  Arta Silence, MD Inpatient   11/25/2017 2159 11/27/2017 2231 Full Code [id number]  Nicholes Mango, MD Inpatient   10/10/2017 1338 10/18/2017 2059 Full Code [id number]  Arta Silence, MD ED      Home/SNF/Other   Chief Complaint Chest/Abd Pain  Level of Care/Admitting Diagnosis ED Disposition    ED Disposition Condition [facility] Area: [place] [[id number]]  Level of Care: Med-Surg [16]  Diagnosis: Abdominal pain [381829]  Admitting Physician: Arta Silence [[id number]]  Attending Physician: Arta Silence [[id number]]  Estimated length of stay: past midnight tomorrow  Certification:: I certify this patient will need inpatient services for at least 2 midnights  PT Class (Do Not Modify): Inpatient [101]  PT Acc Code (Do Not Modify): Private [1]       Medical History Past Medical History:  Diagnosis Date  . Constipation   . Diarrhea   . Glomerulonephritis   . Hemorrhoids   . Kidney infection   . Nausea and vomiting   . Occult blood in stools   . Shortness of breath   . Sleep apnea     Allergies No Known Allergies  IV Location/Drains/Wounds Patient Lines/Drains/Airways Status   Active Line/Drains/Airways    Name:   Placement date:   Placement time:   Site:   Days:   Peripheral IV 05/23/18 Right Antecubital   05/23/18    2249    Antecubital   1   Hemodialysis Catheter Right Subclavian   10/15/17    1612    Subclavian   221   Incision (Closed) 10/15/17 Chest Upper;Right   10/15/17    2040     221          Labs/Imaging Results for orders placed or performed during the hospital encounter of 05/23/18 (from the past 48 hour(s))  Basic metabolic panel     Status: Abnormal   Collection Time: 05/23/18 10:51 PM  Result Value Ref Range   Sodium 140 135 - 145 mmol/L   Potassium 4.4 3.5 - 5.1 mmol/L   Chloride 97 (L) 98 - 111 mmol/L   CO2 21 (L) 22 - 32 mmol/L   Glucose, Bld 99 70 - 99 mg/dL   BUN 99 (H) 6 - 20 mg/dL    Comment: RESULT CONFIRMED BY MANUAL DILUTION TTG   Creatinine, Ser 16.60 (H) 0.61 - 1.24 mg/dL   Calcium 7.9 (L) 8.9 - 10.3 mg/dL   GFR calc non Af Amer 3 (L) >60 mL/min   GFR calc Af Amer 4 (L) >60 mL/min   Anion gap 22 (H) 5 - 15    Comment: Performed at [facility], [address] [place], [place] [address]  CBC     Status: Abnormal   Collection Time: 05/23/18 10:51 PM  Result Value Ref Range   WBC 14.9 (H) 4.0 - 10.5 K/uL   RBC 2.49 (L) 4.22 - 5.81 MIL/uL   Hemoglobin 7.7 (L) 13.0 - 17.0 g/dL   HCT 25.1 (L) 39.0 - 52.0 %   MCV 100.8 (H) 80.0 - 100.0 fL   MCH 30.9 26.0 - 34.0 pg   MCHC 30.7 30.0 - 36.0 g/dL   RDW 17.4 (H) 11.5 - 15.5 %   Platelets 171 150 -  400 K/uL   nRBC 1.4 (H) 0.0 - 0.2 %    Comment: Performed at [facility], [address] [place], [place] [address]  Troponin I - ONCE - STAT     Status: Abnormal   Collection Time: 05/23/18 10:51 PM  Result Value Ref Range   Troponin I 0.05 (HH) <0.03 ng/mL    Comment: CRITICAL RESULT CALLED TO, READ BACK BY AND VERIFIED WITH JENNA Alcus Bradly @2352  05/25/2018 TTG Performed at [facility], [address] [place], [place] [address]   Lipase, blood     Status: Abnormal   Collection Time: 05/23/18 10:51 PM  Result Value Ref Range   Lipase 192 (H) 11 - 51 U/L    Comment: Performed at [facility], [address] [place], [place] [address]  Hepatic function panel     Status: Abnormal   Collection Time: 05/23/18 10:51 PM  Result Value Ref Range   Total Protein 6.2 (L) 6.5 - 8.1 g/dL   Albumin 3.5 3.5 - 5.0 g/dL   AST 12 (L) 15 - 41 U/L   ALT 9 0 - 44 U/L   Alkaline Phosphatase 34 (L) 38 - 126 U/L   Total Bilirubin 0.7 0.3 - 1.2  mg/dL   Bilirubin, Direct <0.1 0.0 - 0.2 mg/dL   Indirect Bilirubin NOT CALCULATED 0.3 - 0.9 mg/dL    Comment: Performed at [facility], [address] [place],  [address]   Dg Chest 2 View  Result Date: 05/23/2018 CLINICAL DATA:  Chest pain EXAM: CHEST - 2 VIEW COMPARISON:  02/05/2018 FINDINGS: No focal opacity or pleural effusion. Mild bronchitic changes. Normal heart size. No pneumothorax. IMPRESSION: No active cardiopulmonary disease.  Mild bronchitic changes. Electronically Signed   By: Donavan Foil M.D.   On: 05/23/2018 23:25    Pending Labs Unresulted Labs (From admission, onward)    Start     Ordered   05/24/18 0007  Body fluid culture  Once,   STAT    Question:  Patient immune status  Answer:  Normal   05/24/18 0006   05/24/18 0006  Body fluid cell count with differential  Once,   STAT    Question:  Are there also cytology or pathology orders on this specimen?  Answer:  No   05/24/18 0006   Signed and Held  Basic metabolic panel  Tomorrow morning,   R     Signed and Held          Vitals/Pain Today's Vitals   05/23/18 2330 05/24/18 0000 05/24/18 0030 05/24/18 0100  BP:      Pulse: 62 64 63 63  Resp: 11 12 19 12   Temp:      TempSrc:      SpO2: 90% 93% 91% 91%  Weight:      Height:      PainSc:        Isolation Precautions No active isolations  Medications Medications  cefTRIAXone (ROCEPHIN) 1 g in sodium chloride 0.9 % 100 mL IVPB (1 g Intravenous New Bag/Given 05/24/18 0125)  HYDROmorphone (DILAUDID) injection 0.5 mg (has no administration in time range)  fentaNYL (SUBLIMAZE) injection 100 mcg (100 mcg Intravenous Given 05/24/18 0125)

## 2018-05-25 LAB — CBC
HCT: 27.4 % — ABNORMAL LOW (ref 39.0–52.0)
Hemoglobin: 8.7 g/dL — ABNORMAL LOW (ref 13.0–17.0)
MCH: 31.1 pg (ref 26.0–34.0)
MCHC: 31.8 g/dL (ref 30.0–36.0)
MCV: 97.9 fL (ref 80.0–100.0)
NRBC: 0.5 % — AB (ref 0.0–0.2)
Platelets: 196 10*3/uL (ref 150–400)
RBC: 2.8 MIL/uL — ABNORMAL LOW (ref 4.22–5.81)
RDW: 17.7 % — ABNORMAL HIGH (ref 11.5–15.5)
WBC: 12.8 10*3/uL — ABNORMAL HIGH (ref 4.0–10.5)

## 2018-05-25 LAB — BASIC METABOLIC PANEL
Anion gap: 20 — ABNORMAL HIGH (ref 5–15)
BUN: 100 mg/dL — ABNORMAL HIGH (ref 6–20)
CO2: 21 mmol/L — ABNORMAL LOW (ref 22–32)
Calcium: 7.4 mg/dL — ABNORMAL LOW (ref 8.9–10.3)
Chloride: 93 mmol/L — ABNORMAL LOW (ref 98–111)
Creatinine, Ser: 15.53 mg/dL — ABNORMAL HIGH (ref 0.61–1.24)
GFR calc Af Amer: 4 mL/min — ABNORMAL LOW (ref >60)
GFR calc non Af Amer: 4 mL/min — ABNORMAL LOW (ref >60)
Glucose, Bld: 88 mg/dL (ref 70–99)
Potassium: 4 mmol/L (ref 3.5–5.1)
Sodium: 134 mmol/L — ABNORMAL LOW (ref 135–145)

## 2018-05-25 MED ORDER — OXYCODONE-ACETAMINOPHEN 5-325 MG PO TABS
1.0000 | ORAL_TABLET | Freq: Once | ORAL | Status: AC
  Administered 2018-05-25: 1 via ORAL
  Filled 2018-05-25: qty 1

## 2018-05-25 MED ORDER — CLONIDINE HCL 0.2 MG PO TABS: 0.2000 mg | ORAL_TABLET | Freq: Two times a day (BID) | ORAL | 0 refills | Status: DC

## 2018-05-25 MED ORDER — OXYCODONE-ACETAMINOPHEN 5-325 MG PO TABS: 1.0000 | ORAL_TABLET | ORAL | 0 refills | Status: DC | PRN

## 2018-05-25 MED ORDER — SULFAMETHOXAZOLE-TRIMETHOPRIM 400-80 MG PO TABS: 1.0000 | ORAL_TABLET | Freq: Every day | ORAL | 0 refills | Status: DC

## 2018-05-25 NOTE — Progress Notes (Signed)
[facility], [place] 05/25/18  Subjective:   Patient continues to have pain in his right lower quadrant.  It is tunnel tenderness.  PD fluid is negative for peritonitis.  CT of the abdomen as well as right lower quadrant shows small amount of fluid collection around the catheter.  Objective:  Vital signs in last 24 hours:  Temp:  [97.7 F (36.5 C)-98 F (36.7 C)] 97.9 F (36.6 C) (12/22 0459) Pulse Rate:  [72-81] 74 (12/22 0459) Resp:  [17-18] 18 (12/22 0459) BP: (112-142)/(73-92) 112/73 (12/22 0459) SpO2:  [93 %-95 %] 95 % (12/22 0459)  Weight change:  Filed Weights   05/23/18 2236 05/24/18 0217  Weight: 72.6 kg 78.2 kg    Intake/Output:    Intake/Output Summary (Last 24 hours) at 05/25/2018 1202 Last data filed at 05/25/2018 1153 Gross per 24 hour  Intake 199.08 ml  Output 198 ml  Net 1.08 ml     Physical Exam: General:  Well-appearing, laying in the bed, no acute distress  HEENT  moist oral mucous membranes  Neck  supple  Pulm/lungs  normal breathing effort, clear to auscultation bilaterally  CVS/Heart  regular rhythm  Abdomen:   Right lower quadrant superficial tenderness, + tunnel tenderness  Extremities:  No peripheral edema  Neurologic:  Alert, oriented  Skin:  No acute rashes  Access:  PD catheter       Basic Metabolic Panel:  Recent Labs  Lab 05/23/18 2251 05/24/18 0312 05/25/18 0507  NA 140  --  134*  K 4.4  --  4.0  CL 97*  --  93*  CO2 21*  --  21*  GLUCOSE 99  --  88  BUN 99*  --  100*  CREATININE 16.60*  --  15.53*  CALCIUM 7.9*  --  7.4*  MG  --  1.7  --   PHOS  --  11.2*  --      CBC: Recent Labs  Lab 05/23/18 2251 05/25/18 1136  WBC 14.9* 12.8*  HGB 7.7* 8.7*  HCT 25.1* 27.4*  MCV 100.8* 97.9  PLT 171 196      Lab Results  Component Value Date   HEPBSAG Negative 10/10/2017   HEPBSAG Negative 10/10/2017   HEPBSAB Reactive 10/10/2017   HEPBIGM Negative 10/10/2017       Microbiology:  Recent Results (from the past 240 hour(s))  MRSA PCR Screening     Status: None   Collection Time: 05/24/18  2:29 AM  Result Value Ref Range Status   MRSA by PCR NEGATIVE NEGATIVE Final    Comment:        The GeneXpert MRSA Assay (FDA approved for NASAL specimens only), is one component of a comprehensive MRSA colonization surveillance program. It is not intended to diagnose MRSA infection nor to guide or monitor treatment for MRSA infections. Performed at [facility], [address] [place], [place] [address]   Body fluid culture     Status: None (Preliminary result)   Collection Time: 05/24/18  9:35 AM  Result Value Ref Range Status   Specimen Description   Final    PERITONEAL Performed at [facility], [address] [place], [place] [address]    Special Requests   Final    Normal Performed at [facility], [place], [place] [address]    Gram Stain NO WBC SEEN NO ORGANISMS SEEN   Final   Culture   Final    NO GROWTH <  24 HOURS Performed at [facility], [place] [address], [place], [place]  [address]    Report Status PENDING  Incomplete    Coagulation Studies: No results for input(s): LABPROT, INR in the last 72 hours.  Urinalysis: No results for input(s): COLORURINE, LABSPEC, PHURINE, GLUCOSEU, HGBUR, BILIRUBINUR, KETONESUR, PROTEINUR, UROBILINOGEN, NITRITE, LEUKOCYTESUR in the last 72 hours.  Invalid input(s): APPERANCEUR    Imaging: Ct Abdomen Pelvis Wo Contrast  Result Date: 05/24/2018 CLINICAL DATA:  Abdominal pain, peritoneal dialysis patient, RIGHT lower quadrant fullness, pain for 2 weeks which got worse yesterday, unable to bend or get out of car, rates pain at 10/10 EXAM: CT ABDOMEN AND PELVIS WITHOUT CONTRAST TECHNIQUE: Multidetector CT imaging of the abdomen and pelvis was performed following the standard protocol without IV contrast. Sagittal and coronal MPR images  reconstructed from axial data set. Patient drank dilute oral contrast for exam. COMPARISON:  02/05/2018 FINDINGS: Lower chest: Bibasilar atelectasis Hepatobiliary: Upper normal hepatic attenuation without focal mass lesion. Gallbladder unremarkable. Pancreas: Normal appearance Spleen: Normal appearance Adrenals/Urinary Tract: Adrenal glands, kidneys, and ureters normal appearance. Bladder decompressed. Stomach/Bowel: Surgical clip at tip of cecum likely reflects prior appendectomy. Stomach normal appearance. Sigmoid diverticulosis, mild. No CT evidence of diverticulitis. Remaining bowel loops unremarkable. Vascular/Lymphatic: Aorta normal caliber.  No adenopathy. Reproductive: Unremarkable Other: Moderate free fluid likely dialysate. Peritoneal dialysis catheter coiled in pelvis. No loculated fluid collection is seen. No free air. Small umbilical hernia containing fat. Scattered soft tissue edema. Musculoskeletal: Osseous mineralization normal. No acute bone lesions. Small BILATERAL hip joint effusions. IMPRESSION: Small umbilical hernia containing fat. Mild diffuse soft tissue edema, nonspecific, can be seen with anasarca, hypoproteinemia, and other causes. Sigmoid diverticulosis without evidence of diverticulitis. Small BILATERAL nonspecific hip joint effusions; can not exclude differentiate between sterile an infected joint effusions by CT recommend clinical correlation and if there is clinical concern for septic arthritis recommend joint aspiration. Electronically Signed   By: Lavonia Dana M.D.   On: 05/24/2018 17:47   Dg Chest 2 View  Result Date: 05/23/2018 CLINICAL DATA:  Chest pain EXAM: CHEST - 2 VIEW COMPARISON:  02/05/2018 FINDINGS: No focal opacity or pleural effusion. Mild bronchitic changes. Normal heart size. No pneumothorax. IMPRESSION: No active cardiopulmonary disease.  Mild bronchitic changes. Electronically Signed   By: Donavan Foil M.D.   On: 05/23/2018 23:25   US Abdomen  Limited  Result Date: 05/24/2018 CLINICAL DATA:  Right lower quadrant pain 2 weeks. Patient has peritoneal dialysis catheter. EXAM: ULTRASOUND ABDOMEN LIMITED COMPARISON:  CT 02/05/2018 FINDINGS: Ultrasound was performed over the lower abdomen just below the umbilicus and right of midline in the area patient's pain. Exam demonstrates evidence of patient's peritoneal dialysis catheter in this region with suggestion of a small amount of air and fluid around the catheter within the abdominal wall. IMPRESSION: Suggestion of small amount of subcutaneous air and fluid adjacent the peritoneal dialysis catheter in the right lower abdominal wall. This may be infected versus noninfected fluid collection or may be secondary to catheter manipulation. Electronically Signed   By: Marin Olp M.D.   On: 05/24/2018 14:33     Medications:   . dialysis solution 1.5% low-MG/low-CA     . ALPRAZolam  0.25 mg Oral BID  . amLODipine  10 mg Oral Daily  . calcium acetate  1,334 mg Oral TID WC  . calcium carbonate  1 tablet Oral BID  . cloNIDine  0.2 mg Oral BID  . extraneal (ICODEXTRIN) peritoneal dialysis solution  Intraperitoneal Q24H  . feeding supplement (NEPRO CARB STEADY)  237 mL Oral BID BM  . furosemide  80 mg Oral Daily  . gentamicin cream  1 application Topical Daily  . heparin  5,000 Units Subcutaneous Q8H  . isosorbide mononitrate  30 mg Oral Daily  . losartan  100 mg Oral QHS  . metaxalone  400 mg Oral TID  . metoprolol tartrate  50 mg Oral BID  . multivitamin  1 tablet Oral QHS  . pantoprazole  40 mg Oral Daily  . predniSONE  20 mg Oral Q breakfast  . rOPINIRole  0.5 mg Oral QHS  . vancomycin variable dose per unstable renal function (pharmacist dosing)   Does not apply See admin instructions   acetaminophen **OR** acetaminophen, bisacodyl, dianeal solution for CAPD/CCPD with heparin, HYDROmorphone (DILAUDID) injection, ondansetron **OR** ondansetron (ZOFRAN) IV, senna-docusate  Assessment/  Plan:  32 y.o. caucasian male with end stage renal disease on hemodialysis, hypertension, crescentic glomerulonephritis, who was admitted to [facility] for abdominal pain  CCPD/ Dr. Holley Raring 9 hrs/ 2 x 2500 cc/ last fill icodextrine 2000 cc  1.  End-stage renal disease 2.  Anemia chronic kidney disease 3.  Secondary hyperparathyroidism 4.  Abdominal pain- PD catheter tunnel infection 5.  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  severe Hypertension with CKD  Plan: CT shows a small umbilical hernia.  Differential diagnosis includes tunnel infection versus injury to the PD catheter.  Plan to treat with Bactrim orally for 10 days.  Pain control.  Repeat CBC today. Other differential includes calciphylaxis as his phosphorus level is severely uncontrolled. Patient has severe hypertension.  After starting icodextrin recently, his blood pressure has been actually low.  He reported up to systolic blood pressure of 80 yesterday.  We will discontinue hydralazine for now.  Reduce the dose of clonidine to 0.2 mg  3 times a day.  Blood pressure controlled with current regimen Continue home dose of PhosLo  We will follow closely.   LOS: 1 Eldora Napp 12/22/201912:02 PM  [place], [place]  Note: This note was prepared with Dragon dictation. Any transcription errors are unintentional

## 2018-05-25 NOTE — Progress Notes (Signed)
Pd completed 

## 2018-05-25 NOTE — Discharge Summary (Signed)
Jeffrey Holloway, is a 32 y.o. male  DOB [birth year]  MRN [id number].  Admission date:  05/23/2018  Admitting Physician  Arta Silence, MD  Discharge Date:  05/25/2018   Primary MD  Jeffrey Holloway, No Pcp Per  Recommendations for primary care physician for things to follow:   Follow-up with Dr. Anthonette Legato in 1 week   Admission Diagnosis  Peritonitis [facility]) [K65.9]   Discharge Diagnosis  Peritonitis ([place]) [K65.9]    Active Problems:   Abdominal pain      Past Medical History:  Diagnosis Date  . Constipation   . Diarrhea   . Glomerulonephritis   . Hemorrhoids   . Kidney infection   . Nausea and vomiting   . Occult blood in stools   . Shortness of breath   . Sleep apnea     Past Surgical History:  Procedure Laterality Date  . APPENDECTOMY    . DIALYSIS/PERMA CATHETER INSERTION N/A 10/15/2017   Procedure: DIALYSIS/PERMA CATHETER INSERTION;  Surgeon: Katha Cabal, MD;  Location: [place] [facility];  Service: Cardiovascular;  Laterality: N/A;  . 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   Hospital Course  #1. right lower quadrant abdominal pain, admitted to medical service, peritoneal dialysis fluid  sent for cultures, WBC, and received vancomycin and admitted for possible PD catheter infection, peritoneal dialysis fluid is not showing any infection, had a CT of abdomen, ultrasound of right lower quadrant, which showed small umbilical hernia, small amount of fluid collection around the catheter.  Jeffrey Holloway is seen by nephrology Dr. Murlean Iba, possible different diagnosis was none infection versus injury to peritoneal Lasix catheter.  Jeffrey Holloway initial WBC 14 on admission and decreased to 12.8.  Will discharge the Jeffrey Holloway home with Bactrim 2 weeks and also limited supply of narcotics for pain control.  #2 .hypotension on admission improved and holding his BP medicines namely clonidine, we will decrease the clonidine to 0.2 mg 3 times daily, discontinue hydralazine.  #3 .ESRD, he Jeffrey Holloway is on peritoneal dialysis, has history of recent cocaine induced vasculitis and started on prednisone by Dr. Anthonette Legato. 4.  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  Jeffrey Holloway can follow-up with nephrology and make further adjustment of medicines.  Discharge Condition:    Follow UP      Discharge Instructions  and  Discharge Medications      Allergies as of 05/25/2018   No Known Allergies     Medication List    STOP taking these medications   hydrALAZINE 100 MG tablet Commonly known as:  APRESOLINE     TAKE these medications   ALPRAZolam 0.25 MG tablet Commonly known as:  XANAX Take 1 tablet by mouth 2 (two) times daily.   amLODipine 10 MG tablet Commonly known  as:  NORVASC Take 1 tablet (10 mg total) by mouth daily.   calcium acetate 667 MG capsule Commonly known as:  PHOSLO Take 2 capsules (1,334 mg total) by mouth 3 (three) times daily with meals.   calcium carbonate 500 MG chewable tablet Commonly known as:  TUMS - dosed in mg  elemental calcium Chew 1 tablet by mouth 2 (two) times daily.   cloNIDine 0.2 MG tablet Commonly known as:  CATAPRES Take 1 tablet (0.2 mg total) by mouth 2 (two) times daily. What changed:  how much to take   feeding supplement (NEPRO CARB STEADY) Liqd Take 237 mLs by mouth 2 (two) times daily between meals.   furosemide 80 MG tablet Commonly known as:  LASIX Take 1 tablet by mouth daily.   isosorbide mononitrate 30 MG 24 hr tablet Commonly known as:  IMDUR Take 1 tablet (30 mg total) by mouth daily.   losartan 100 MG tablet Commonly known as:  COZAAR Take 1 tablet (100 mg total) by mouth daily.   metoprolol tartrate 50 MG tablet Commonly known as:  LOPRESSOR Take 1 tablet (50 mg total) by mouth 2 (two) times daily.   MINOXIDIL PO Take by mouth.   multivitamin Tabs tablet Take 1 tablet by mouth at bedtime.   omeprazole 20 MG capsule Commonly known as:  PRILOSEC Take 20 mg by mouth daily.   oxyCODONE-acetaminophen 5-325 MG tablet Commonly known as:  PERCOCET Take 1 tablet by mouth every 4 (four) hours as needed for moderate pain or severe pain.   predniSONE 20 MG tablet Commonly known as:  DELTASONE Take 1.5 tablets (30 mg total) by mouth daily with breakfast. What changed:  how much to take   rOPINIRole 0.5 MG tablet Commonly known as:  REQUIP Take 1 tablet by mouth at bedtime.   sulfamethoxazole-trimethoprim 400-80 MG tablet Commonly known as:  BACTRIM Take 1 tablet by mouth daily.         Diet and Activity recommendation: See Discharge Instructions above   Consults obtained -nephrology   Major procedures and Radiology Reports - PLEASE review detailed and final reports for all details, in brief -     Ct Abdomen Pelvis Wo Contrast  Result Date: 05/24/2018 CLINICAL DATA:  Abdominal pain, peritoneal dialysis Jeffrey Holloway, RIGHT lower quadrant fullness, pain for 2 weeks which got worse yesterday, unable to bend or get out of car, rates pain at 10/10  EXAM: CT ABDOMEN AND PELVIS WITHOUT CONTRAST TECHNIQUE: Multidetector CT imaging of the abdomen and pelvis was performed following the standard protocol without IV contrast. Sagittal and coronal MPR images reconstructed from axial data set. Jeffrey Holloway drank dilute oral contrast for exam. COMPARISON:  02/05/2018 FINDINGS: Lower chest: Bibasilar atelectasis Hepatobiliary: Upper normal hepatic attenuation without focal mass lesion. Gallbladder unremarkable. Pancreas: Normal appearance Spleen: Normal appearance Adrenals/Urinary Tract: Adrenal glands, kidneys, and ureters normal appearance. Bladder decompressed. Stomach/Bowel: Surgical clip at tip of cecum likely reflects prior appendectomy. Stomach normal appearance. Sigmoid diverticulosis, mild. No CT evidence of diverticulitis. Remaining bowel loops unremarkable. Vascular/Lymphatic: Aorta normal caliber.  No adenopathy. Reproductive: Unremarkable Other: Moderate free fluid likely dialysate. Peritoneal dialysis catheter coiled in pelvis. No loculated fluid collection is seen. No free air. Small umbilical hernia containing fat. Scattered soft tissue edema. Musculoskeletal: Osseous mineralization normal. No acute bone lesions. Small BILATERAL hip joint effusions. IMPRESSION: Small umbilical hernia containing fat. Mild diffuse soft tissue edema, nonspecific, can be seen with anasarca, hypoproteinemia, and other causes. Sigmoid diverticulosis without evidence of diverticulitis. Small BILATERAL  nonspecific hip joint effusions; can not exclude differentiate between sterile an infected joint effusions by CT recommend clinical correlation and if there is clinical concern for septic arthritis recommend joint aspiration. Electronically Signed   By: Lavonia Dana M.D.   On: 05/24/2018 17:47   Dg Chest 2 View  Result Date: 05/23/2018 CLINICAL DATA:  Chest pain EXAM: CHEST - 2 VIEW COMPARISON:  02/05/2018 FINDINGS: No focal opacity or pleural effusion. Mild bronchitic changes.  Normal heart size. No pneumothorax. IMPRESSION: No active cardiopulmonary disease.  Mild bronchitic changes. Electronically Signed   By: Donavan Foil M.D.   On: 05/23/2018 23:25   US Abdomen Limited  Result Date: 05/24/2018 CLINICAL DATA:  Right lower quadrant pain 2 weeks. Jeffrey Holloway has peritoneal dialysis catheter. EXAM: ULTRASOUND ABDOMEN LIMITED COMPARISON:  CT 02/05/2018 FINDINGS: Ultrasound was performed over the lower abdomen just below the umbilicus and right of midline in the area Jeffrey Holloway's pain. Exam demonstrates evidence of Jeffrey Holloway's peritoneal dialysis catheter in this region with suggestion of a small amount of air and fluid around the catheter within the abdominal wall. IMPRESSION: Suggestion of small amount of subcutaneous air and fluid adjacent the peritoneal dialysis catheter in the right lower abdominal wall. This may be infected versus noninfected fluid collection or may be secondary to catheter manipulation. Electronically Signed   By: Marin Olp M.D.   On: 05/24/2018 14:33    Micro Results     Recent Results (from the past 240 hour(s))  MRSA PCR Screening     Status: None   Collection Time: 05/24/18  2:29 AM  Result Value Ref Range Status   MRSA by PCR NEGATIVE NEGATIVE Final    Comment:        The GeneXpert MRSA Assay (FDA approved for NASAL specimens only), is one component of a comprehensive MRSA colonization surveillance program. It is not intended to diagnose MRSA infection nor to guide or monitor treatment for MRSA infections. Performed at [facility], [address] [place], [place] [address]   Body fluid culture     Status: None (Preliminary result)   Collection Time: 05/24/18  9:35 AM  Result Value Ref Range Status   Specimen Description   Final    PERITONEAL Performed at [facility], [address] [place], [place] [address]    Special Requests   Final    Normal Performed at [facility], [place], [place] [address]    Gram Stain NO WBC SEEN NO ORGANISMS SEEN   Final   Culture   Final    NO GROWTH < 24 HOURS Performed at [facility], [place] [address] [place], [place] [address]    Report Status PENDING  Incomplete       Today   Subjective:   Mihail Prettyman today has no headache,no chest abdominal pain,no new weakness tingling or numbness, feels much better wants to go home today.   Objective:   Blood pressure 112/73, pulse 74, temperature 97.9 F (36.6 C), temperature source Oral, resp. rate 18, height 6' (1.829 m), weight 78.2 kg, SpO2 95 %.   Intake/Output Summary (Last 24 hours) at 05/25/2018 1221 Last data filed at 05/25/2018 1153 Gross per 24 hour  Intake 199.08 ml  Output 198 ml  Net 1.08 ml    Exam Awake Alert, Oriented x 3, No new F.N deficits, Normal affect [place].AT,PERRAL Supple Neck,No JVD, No cervical lymphadenopathy appriciated.  Symmetrical Chest wall movement, Good air movement bilaterally, CTAB RRR,No  Gallops,Rubs or new Murmurs, No Parasternal Heave +ve B.Sounds, Abd Soft, Non tender, No organomegaly appriciated, No rebound -guarding or rigidity. No Cyanosis, Clubbing or edema, No new Rash or bruise  Data Review   CBC w Diff:  Lab Results  Component Value Date   WBC 12.8 (H) 05/25/2018   HGB 8.7 (L) 05/25/2018   HGB 12.1 (L) 01/19/2012   HCT 27.4 (L) 05/25/2018   HCT 35.3 (L) 01/19/2012   PLT 196 05/25/2018   PLT 152 01/19/2012   LYMPHOPCT 18 04/01/2018   LYMPHOPCT 25.7 01/19/2012   BANDSPCT 0 02/05/2018   MONOPCT 9 04/01/2018   MONOPCT 10.6 01/19/2012   EOSPCT 0 04/01/2018   EOSPCT 1.7 01/19/2012   BASOPCT 0 04/01/2018   BASOPCT 0.4 01/19/2012    CMP:  Lab Results  Component Value Date   NA 134 (L) 05/25/2018   NA 134 (L) 01/17/2012   K 4.0 05/25/2018   K 3.7 01/17/2012   CL 93 (L) 05/25/2018   CL 100 01/17/2012   CO2 21 (L) 05/25/2018   CO2 27 01/17/2012   BUN 100 (H) 05/25/2018   BUN 7 01/17/2012    CREATININE 15.53 (H) 05/25/2018   CREATININE 0.94 01/17/2012   PROT 6.2 (L) 05/23/2018   PROT 9.3 (H) 01/17/2012   ALBUMIN 3.5 05/23/2018   ALBUMIN 4.7 01/17/2012   BILITOT 0.7 05/23/2018   BILITOT 0.8 01/17/2012   ALKPHOS 34 (L) 05/23/2018   ALKPHOS 89 01/17/2012   AST 12 (L) 05/23/2018   AST 21 01/17/2012   ALT 9 05/23/2018   ALT 24 01/17/2012  .   Total Time in preparing paper work, data evaluation and todays exam - 35 minutes  Epifanio Lesches M.D on 05/25/2018 at 12:21 PM    Note: This dictation was prepared with Dragon dictation along with smaller phrase technology. Any transcriptional errors that result from this process are unintentional.

## 2018-05-25 NOTE — Progress Notes (Signed)
Patient discharged to home. Prescriptions given. IV removed. Patient questions answered.

## 2018-05-26 LAB — CALCIUM, IONIZED: Calcium, Ionized, Serum: 3.8 mg/dL — ABNORMAL LOW (ref 4.5–5.6)

## 2018-05-26 LAB — PATHOLOGIST SMEAR REVIEW

## 2018-05-26 LAB — PARATHYROID HORMONE, INTACT (NO CA): PTH: 92 pg/mL — ABNORMAL HIGH (ref 15–65)

## 2018-05-27 LAB — VITAMIN D 25 HYDROXY (VIT D DEFICIENCY, FRACTURES): Vit D, 25-Hydroxy: 24.1 ng/mL — ABNORMAL LOW (ref 30.0–100.0)

## 2018-05-27 LAB — BODY FLUID CULTURE
Culture: NO GROWTH
Gram Stain: NONE SEEN
SPECIAL REQUESTS: NORMAL

## 2018-05-29 ENCOUNTER — Inpatient Hospital Stay
Admission: EM | Admit: 2018-05-29 | Discharge: 2018-06-01 | DRG: 919 | Disposition: A | Discharge status: Home / Self Care | Payer: BLUE CROSS/BLUE SHIELD | Attending: Internal Medicine | Admitting: Internal Medicine

## 2018-05-29 ENCOUNTER — Encounter: Payer: Self-pay | Admitting: Emergency Medicine

## 2018-05-29 ENCOUNTER — Other Ambulatory Visit: Payer: Self-pay

## 2018-05-29 ENCOUNTER — Emergency Department: Payer: BLUE CROSS/BLUE SHIELD

## 2018-05-29 DIAGNOSIS — N186 End stage renal disease: Secondary | ICD-10-CM | POA: Diagnosis present

## 2018-05-29 DIAGNOSIS — J189 Pneumonia, unspecified organism: Secondary | ICD-10-CM | POA: Diagnosis present

## 2018-05-29 DIAGNOSIS — N2581 Secondary hyperparathyroidism of renal origin: Secondary | ICD-10-CM | POA: Diagnosis present

## 2018-05-29 DIAGNOSIS — Z9049 Acquired absence of other specified parts of digestive tract: Secondary | ICD-10-CM | POA: Diagnosis not present

## 2018-05-29 DIAGNOSIS — R509 Fever, unspecified: Secondary | ICD-10-CM

## 2018-05-29 DIAGNOSIS — K429 Umbilical hernia without obstruction or gangrene: Secondary | ICD-10-CM | POA: Diagnosis present

## 2018-05-29 DIAGNOSIS — Y841 Kidney dialysis as the cause of abnormal reaction of the patient, or of later complication, without mention of misadventure at the time of the procedure: Secondary | ICD-10-CM | POA: Diagnosis present

## 2018-05-29 DIAGNOSIS — T8571XA Infection and inflammatory reaction due to peritoneal dialysis catheter, initial encounter: Principal | ICD-10-CM | POA: Diagnosis present

## 2018-05-29 DIAGNOSIS — I120 Hypertensive chronic kidney disease with stage 5 chronic kidney disease or end stage renal disease: Secondary | ICD-10-CM | POA: Diagnosis present

## 2018-05-29 DIAGNOSIS — F17210 Nicotine dependence, cigarettes, uncomplicated: Secondary | ICD-10-CM | POA: Diagnosis present

## 2018-05-29 DIAGNOSIS — K659 Peritonitis, unspecified: Secondary | ICD-10-CM | POA: Diagnosis present

## 2018-05-29 DIAGNOSIS — R1084 Generalized abdominal pain: Secondary | ICD-10-CM

## 2018-05-29 DIAGNOSIS — K219 Gastro-esophageal reflux disease without esophagitis: Secondary | ICD-10-CM | POA: Diagnosis present

## 2018-05-29 DIAGNOSIS — Y95 Nosocomial condition: Secondary | ICD-10-CM | POA: Diagnosis present

## 2018-05-29 DIAGNOSIS — Z7952 Long term (current) use of systemic steroids: Secondary | ICD-10-CM

## 2018-05-29 DIAGNOSIS — K650 Generalized (acute) peritonitis: Secondary | ICD-10-CM | POA: Diagnosis present

## 2018-05-29 DIAGNOSIS — D631 Anemia in chronic kidney disease: Secondary | ICD-10-CM | POA: Diagnosis present

## 2018-05-29 DIAGNOSIS — T8571XD Infection and inflammatory reaction due to peritoneal dialysis catheter, subsequent encounter: Secondary | ICD-10-CM

## 2018-05-29 DIAGNOSIS — I10 Essential (primary) hypertension: Secondary | ICD-10-CM | POA: Diagnosis present

## 2018-05-29 DIAGNOSIS — Z79899 Other long term (current) drug therapy: Secondary | ICD-10-CM

## 2018-05-29 DIAGNOSIS — Y711 Therapeutic (nonsurgical) and rehabilitative cardiovascular devices associated with adverse incidents: Secondary | ICD-10-CM | POA: Diagnosis present

## 2018-05-29 DIAGNOSIS — J9601 Acute respiratory failure with hypoxia: Secondary | ICD-10-CM | POA: Diagnosis present

## 2018-05-29 DIAGNOSIS — Z992 Dependence on renal dialysis: Secondary | ICD-10-CM

## 2018-05-29 DIAGNOSIS — G4730 Sleep apnea, unspecified: Secondary | ICD-10-CM | POA: Diagnosis present

## 2018-05-29 LAB — COMPREHENSIVE METABOLIC PANEL
ALT: 11 U/L (ref 0–44)
AST: 15 U/L (ref 15–41)
Albumin: 3.5 g/dL (ref 3.5–5.0)
Alkaline Phosphatase: 40 U/L (ref 38–126)
Anion gap: 17 — ABNORMAL HIGH (ref 5–15)
BUN: 88 mg/dL — ABNORMAL HIGH (ref 6–20)
CHLORIDE: 94 mmol/L — AB (ref 98–111)
CO2: 23 mmol/L (ref 22–32)
Calcium: 7.5 mg/dL — ABNORMAL LOW (ref 8.9–10.3)
Creatinine, Ser: 13.95 mg/dL — ABNORMAL HIGH (ref 0.61–1.24)
GFR calc Af Amer: 5 mL/min — ABNORMAL LOW (ref >60)
GFR calc non Af Amer: 4 mL/min — ABNORMAL LOW (ref >60)
GLUCOSE: 92 mg/dL (ref 70–99)
Potassium: 4.3 mmol/L (ref 3.5–5.1)
SODIUM: 134 mmol/L — AB (ref 135–145)
Total Bilirubin: 0.7 mg/dL (ref 0.3–1.2)
Total Protein: 6.4 g/dL — ABNORMAL LOW (ref 6.5–8.1)

## 2018-05-29 LAB — INFLUENZA PANEL BY PCR (TYPE A & B)
Influenza A By PCR: NEGATIVE
Influenza B By PCR: NEGATIVE

## 2018-05-29 LAB — CBC
HEMATOCRIT: 25.9 % — AB (ref 39.0–52.0)
Hemoglobin: 8 g/dL — ABNORMAL LOW (ref 13.0–17.0)
MCH: 31 pg (ref 26.0–34.0)
MCHC: 30.9 g/dL (ref 30.0–36.0)
MCV: 100.4 fL — ABNORMAL HIGH (ref 80.0–100.0)
Platelets: 163 10*3/uL (ref 150–400)
RBC: 2.58 MIL/uL — ABNORMAL LOW (ref 4.22–5.81)
RDW: 17.5 % — ABNORMAL HIGH (ref 11.5–15.5)
WBC: 16.6 10*3/uL — ABNORMAL HIGH (ref 4.0–10.5)
nRBC: 0.2 % (ref 0.0–0.2)

## 2018-05-29 LAB — LIPASE, BLOOD: LIPASE: 79 U/L — AB (ref 11–51)

## 2018-05-29 MED ORDER — HYDROMORPHONE HCL 1 MG/ML IJ SOLN
1.0000 mg | Freq: Once | INTRAMUSCULAR | Status: AC
  Administered 2018-05-29: 1 mg via INTRAVENOUS
  Filled 2018-05-29: qty 1

## 2018-05-29 MED ORDER — METRONIDAZOLE IN NACL 5-0.79 MG/ML-% IV SOLN
500.0000 mg | Freq: Three times a day (TID) | INTRAVENOUS | Status: AC
  Administered 2018-05-30 (×3): 500 mg via INTRAVENOUS
  Filled 2018-05-29 (×4): qty 100

## 2018-05-29 MED ORDER — SODIUM CHLORIDE 0.9 % IV SOLN: 2.0000 g | Freq: Once | INTRAVENOUS | Status: DC

## 2018-05-29 NOTE — ED Triage Notes (Signed)
Abdominal pain intermittently x 2-3 weeks.  States recently seen through ED for same.  STates pain returned today.

## 2018-05-29 NOTE — H&P (Signed)
[facility] NAME: Angus Amini    MR#:  [id number]  DATE OF BIRTH:  1985/07/08  DATE OF ADMISSION:  05/29/2018  PRIMARY CARE PHYSICIAN: Patient, No Pcp Per   REQUESTING/REFERRING PHYSICIAN: Joni Fears, MD  CHIEF COMPLAINT:   Chief Complaint  Patient presents with  . Abdominal Pain    HISTORY OF PRESENT ILLNESS:  Jeffrey Holloway  is a 32 y.o. male who presents with chief complaint as above.  Patient presents to the ED with recurrent abdominal pain.  He was recently hospitalized here with questionable peritonitis.  He does peritoneal dialysis, and states that he recently had his dialysate type changed.  After this he feels like he developed his symptoms with abdominal pain.  When he was here last time he had them drained his dialysate and replace it with what he had been using previously, and states that his symptoms improved significantly at that time.  However, he did have leukocytosis and was treated with antibiotics during the hospital stay as well.  Nephrology was contacted by ED physician here tonight, who recommended repeating patient CT scan, which showed no significant abdominal abnormality or change from prior scanning, but did show left lower lobe pneumonia.  Plan is for dialysis nurse to remove dialysate for testing and also symptom relief.  Hospitalist were called for admission  PAST MEDICAL HISTORY:   Past Medical History:  Diagnosis Date  . Constipation   . Diarrhea   . Glomerulonephritis   . Hemorrhoids   . Kidney infection   . Nausea and vomiting   . Occult blood in stools   . Shortness of breath   . Sleep apnea      PAST SURGICAL HISTORY:   Past Surgical History:  Procedure Laterality Date  . APPENDECTOMY    . DIALYSIS/PERMA CATHETER INSERTION N/A 10/15/2017   Procedure: DIALYSIS/PERMA CATHETER INSERTION;  Surgeon: Katha Cabal, MD;  Location: [place] [facility];  Service: Cardiovascular;   Laterality: N/A;  . DIALYSIS/PERMA CATHETER REMOVAL N/A 05/12/2018   Procedure: DIALYSIS/PERMA CATHETER REMOVAL;  Surgeon: Algernon Huxley, MD;  Location: [place] [facility];  Service: Cardiovascular;  Laterality: N/A;     SOCIAL HISTORY:   Social History   Tobacco Use  . Smoking status: Current Every Day Smoker    Packs/day: 0.25    Types: Cigarettes  . Smokeless tobacco: Never Used  Substance Use Topics  . Alcohol use: Yes     FAMILY HISTORY:   Family History  Problem Relation Age of Onset  . Varicose Veins Neg Hx   . Vision loss Neg Hx   . Stroke Neg Hx      DRUG ALLERGIES:  No Known Allergies  MEDICATIONS AT HOME:   Prior to Admission medications   Medication Sig Start Date End Date Taking? Authorizing Provider  ALPRAZolam Duanne Moron) 0.25 MG tablet Take 1 tablet by mouth 2 (two) times daily. 05/02/18   [provider]  amLODipine (NORVASC) 10 MG tablet Take 1 tablet (10 mg total) by mouth daily. 10/18/17   Epifanio Lesches, MD  calcium acetate (PHOSLO) 667 MG capsule Take 2 capsules (1,334 mg total) by mouth 3 (three) times daily with meals. 10/18/17   Epifanio Lesches, MD  calcium carbonate (TUMS - DOSED IN MG ELEMENTAL CALCIUM) 500 MG chewable tablet Chew 1 tablet by mouth 2 (two) times daily.    [provider]  cloNIDine (CATAPRES) 0.2 MG tablet Take 1 tablet (0.2 mg  total) by mouth 2 (two) times daily. 05/25/18   Epifanio Lesches, MD  furosemide (LASIX) 80 MG tablet Take 1 tablet by mouth daily. 04/29/18   [provider]  isosorbide mononitrate (IMDUR) 30 MG 24 hr tablet Take 1 tablet (30 mg total) by mouth daily. 04/04/18   Hillary Bow, MD  losartan (COZAAR) 100 MG tablet Take 1 tablet (100 mg total) by mouth daily. 10/18/17   Epifanio Lesches, MD  metoprolol tartrate (LOPRESSOR) 50 MG tablet Take 1 tablet (50 mg total) by mouth 2 (two) times daily. 04/04/18   Gladstone Lighter, MD  MINOXIDIL PO Take by mouth.    [provider]  multivitamin (RENA-VIT) TABS tablet Take 1 tablet by mouth at bedtime. 10/18/17   Epifanio Lesches, MD  Nutritional Supplements (FEEDING SUPPLEMENT, NEPRO CARB STEADY,) LIQD Take 237 mLs by mouth 2 (two) times daily between meals. 10/18/17   Epifanio Lesches, MD  omeprazole (PRILOSEC) 20 MG capsule Take 20 mg by mouth daily.    [provider]  oxyCODONE-acetaminophen (PERCOCET) 5-325 MG tablet Take 1 tablet by mouth every 4 (four) hours as needed for moderate pain or severe pain. 05/25/18 05/25/19  Epifanio Lesches, MD  predniSONE (DELTASONE) 20 MG tablet Take 1.5 tablets (30 mg total) by mouth daily with breakfast. Patient taking differently: Take 20 mg by mouth daily with breakfast.  10/19/17   Epifanio Lesches, MD  rOPINIRole (REQUIP) 0.5 MG tablet Take 1 tablet by mouth at bedtime. 03/21/18   [provider]  sulfamethoxazole-trimethoprim (BACTRIM) 400-80 MG tablet Take 1 tablet by mouth daily. 05/25/18   Epifanio Lesches, MD    REVIEW OF SYSTEMS:  Review of Systems  Constitutional: Positive for fever. Negative for chills, malaise/fatigue and weight loss.  HENT: Negative for ear pain, hearing loss and tinnitus.   Eyes: Negative for blurred vision, double vision, pain and redness.  Respiratory: Negative for cough, hemoptysis and shortness of breath.   Cardiovascular: Negative for chest pain, palpitations, orthopnea and leg swelling.  Gastrointestinal: Positive for abdominal pain and nausea. Negative for constipation, diarrhea and vomiting.  Genitourinary: Negative for dysuria, frequency and hematuria.  Musculoskeletal: Negative for back pain, joint pain and neck pain.  Skin:       No acne, rash, or lesions  Neurological: Negative for dizziness, tremors, focal weakness and weakness.  Endo/Heme/Allergies: Negative for polydipsia. Does not bruise/bleed easily.  Psychiatric/Behavioral: Negative for depression. The patient is not  nervous/anxious and does not have insomnia.      VITAL SIGNS:   Vitals:   05/29/18 2230 05/29/18 2246 05/29/18 2300 05/29/18 2330  BP: (!) 143/86 (!) 143/86 (!) 143/81 (!) 146/86  Pulse:  (!) 104    Resp:  18    Temp:  100.2 F (37.9 C)    TempSrc:  Oral    SpO2:  95%    Weight:      Height:       Wt Readings from Last 3 Encounters:  05/29/18 78.2 kg  05/24/18 78.2 kg  04/04/18 76.9 kg    PHYSICAL EXAMINATION:  Physical Exam  Vitals reviewed. Constitutional: He is oriented to person, place, and time. He appears well-developed and well-nourished. No distress.  HENT:  Head: Normocephalic and atraumatic.  Mouth/Throat: Oropharynx is clear and moist.  Eyes: Pupils are equal, round, and reactive to light. Conjunctivae and EOM are normal. No scleral icterus.  Neck: Normal range of motion. Neck supple. No JVD present. No thyromegaly present.  Cardiovascular: Normal rate, regular rhythm and  intact distal pulses. Exam reveals no gallop and no friction rub.  No murmur heard. Respiratory: Effort normal and breath sounds normal. No respiratory distress. He has no wheezes. He has no rales.  GI: Soft. Bowel sounds are normal. He exhibits no distension. There is abdominal tenderness.  Musculoskeletal: Normal range of motion.        General: No edema.     Comments: No arthritis, no gout  Lymphadenopathy:    He has no cervical adenopathy.  Neurological: He is alert and oriented to person, place, and time. No cranial nerve deficit.  No dysarthria, no aphasia  Skin: Skin is warm and dry. No rash noted. No erythema.  Psychiatric: He has a normal mood and affect. His behavior is normal. Judgment and thought content normal.    LABORATORY PANEL:   CBC Recent Labs  Lab 05/29/18 1628  WBC 16.6*  HGB 8.0*  HCT 25.9*  PLT 163   ------------------------------------------------------------------------------------------------------------------  Chemistries  Recent Labs  Lab  05/24/18 0312  05/29/18 1628  NA  --    < > 134*  K  --    < > 4.3  CL  --    < > 94*  CO2  --    < > 23  GLUCOSE  --    < > 92  BUN  --    < > 88*  CREATININE  --    < > 13.95*  CALCIUM  --    < > 7.5*  MG 1.7  --   --   AST  --   --  15  ALT  --   --  11  ALKPHOS  --   --  40  BILITOT  --   --  0.7   < > = values in this interval not displayed.   ------------------------------------------------------------------------------------------------------------------  Cardiac Enzymes Recent Labs  Lab 05/23/18 2251  TROPONINI 0.05*   ------------------------------------------------------------------------------------------------------------------  RADIOLOGY:  Ct Abdomen Pelvis Wo Contrast  Result Date: 05/29/2018 CLINICAL DATA:  Abdominal pain for 2-3 weeks EXAM: CT ABDOMEN AND PELVIS WITHOUT CONTRAST TECHNIQUE: Multidetector CT imaging of the abdomen and pelvis was performed following the standard protocol without IV contrast. COMPARISON:  05/24/2018 FINDINGS: Lower chest: Lung bases demonstrate some patchy infiltrative change in the left lower lobe slightly increased from the prior exam. Hepatobiliary: No focal liver abnormality is seen. No gallstones, gallbladder wall thickening, or biliary dilatation. Pancreas: Unremarkable. No pancreatic ductal dilatation or surrounding inflammatory changes. Spleen: Normal in size without focal abnormality. Adrenals/Urinary Tract: Adrenal glands are within normal limits. Kidneys are well visualized bilaterally. No obstructive changes are seen. No renal calculi are noted. The bladder is decompressed. Stomach/Bowel: Scattered diverticular change in the sigmoid is noted without evidence of diverticulitis. Changes of prior appendectomy are seen. No small bowel dilatation is noted. Vascular/Lymphatic: No significant vascular findings are present. No enlarged abdominal or pelvic lymph nodes. Reproductive: Prostate is unremarkable. Other: Peritoneal dialysis  catheter is noted deep within the pelvis. Free fluid is noted within the abdomen and pelvis likely related to dialysate. Small fat containing hernia is again noted at the umbilicus. Musculoskeletal: No acute bony abnormality is noted. Previously seen hip effusions bilaterally have resolved. IMPRESSION: Free fluid within the abdomen consistent with peritoneal dialysate. Mild left lower lobe pneumonia. Diverticulosis without diverticulitis. Electronically Signed   By: Inez Catalina M.D.   On: 05/29/2018 23:30    EKG:   Orders placed or performed during the hospital encounter of 05/23/18  . EKG  12-Lead  . EKG 12-Lead  . ED EKG within 10 minutes  . ED EKG within 10 minutes    IMPRESSION AND PLAN:  Principal Problem:   Peritonitis ([place]) -unclear etiology, potentially related to his dialysate, potentially related to infection.  However, given the finding of pneumonia as below it is unclear whether his leukocytosis and fever are not from that instead of an abdominal pathology.  Plan is for dialysis nurse to remove dialysate for testing and also symptom control.  IV antibiotics in place, nephrology consult Active Problems:   HCAP (healthcare-associated pneumonia) -IV antibiotics as above, supportive treatment with antitussive, duo nebs as needed   End-stage renal disease on peritoneal dialysis ([employer]) -peritoneal dialysis per nephrology's recommendations   Essential hypertension -home dose antihypertensives   GERD (gastroesophageal reflux disease) -home dose PPI  Chart review performed and case discussed with ED provider. Labs, imaging and/or ECG reviewed by provider and discussed with patient/family. Management plans discussed with the patient and/or family.  DVT PROPHYLAXIS: SubQ heparin  GI PROPHYLAXIS:  PPI   ADMISSION STATUS: Inpatient     CODE STATUS: Full Code Status History    Date Active Date Inactive Code Status Order ID Comments User Context   05/24/2018 0217 05/25/2018 2033 Full  Code [id number]  Arta Silence, MD Inpatient   03/31/2018 0805 04/04/2018 1644 Full Code [id number]  Arta Silence, MD Inpatient   11/25/2017 2159 11/27/2017 2231 Full Code [id number]  Nicholes Mango, MD Inpatient   10/10/2017 1338 10/18/2017 2059 Full Code [id number]  Arta Silence, MD ED      TOTAL TIME TAKING CARE OF THIS PATIENT: 45 minutes.   Saachi Zale FIELDING 05/29/2018, 11:44 PM  [facility]  Office  [phone]  CC: Primary care physician; Patient, No Pcp Per  Note:  This document was prepared using Dragon voice recognition software and may include unintentional dictation errors.

## 2018-05-29 NOTE — ED Notes (Signed)
Awaiting md eval and plan of care. Family at bedside very anxious coming to nurses station requesting to see physician.

## 2018-05-29 NOTE — ED Notes (Signed)
Patient transported to CT 

## 2018-05-29 NOTE — ED Notes (Signed)
Resumed care from jeanette rn.

## 2018-05-29 NOTE — ED Notes (Signed)
Patient state diluadid had no effect on pain, md aware another dose odered and given.Marland Kitchen

## 2018-05-29 NOTE — ED Notes (Signed)
primedoc in with pt

## 2018-05-29 NOTE — ED Provider Notes (Signed)
[facility] Emergency Department Provider Note  ____________________________________________  Time seen: Approximately 11:12 PM  I have reviewed the triage vital signs and the nursing notes.   HISTORY  Chief Complaint Abdominal Pain    HPI Jeffrey Holloway is a 32 y.o. male with a history of crescentic glomerulonephritis and peritoneal dialysis who comes the ED with generalized abdominal pain mostly focused around his peritoneal catheter site, worsening for the past 5 to 6 days.  He was recently hospitalized with the same symptoms, had a overall reassuring work-up, was continued on Bactrim and discharged from the hospital.  Unfortunately he is continued having gradually worsening pain, nonradiating, worse with movement, no alleviating factors, severe.  Today he started having a fever and vomiting as well.     Past Medical History:  Diagnosis Date  . Constipation   . Diarrhea   . Glomerulonephritis   . Hemorrhoids   . Kidney infection   . Nausea and vomiting   . Occult blood in stools   . Shortness of breath   . Sleep apnea      Patient Active Problem List   Diagnosis Date Noted  . Peritonitis ([place]) 05/29/2018  . Abdominal pain 05/24/2018  . Hypertensive urgency, malignant 03/31/2018  . End-stage renal disease on peritoneal dialysis ([place]) 01/22/2018  . Essential hypertension 01/22/2018  . Cellulitis 11/25/2017  . 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 GERD (gastroesophageal reflux disease) 10/10/2017  . Rectal foreign body      Past Surgical History:  Procedure Laterality Date  . APPENDECTOMY    . DIALYSIS/PERMA CATHETER INSERTION N/A 10/15/2017   Procedure: DIALYSIS/PERMA CATHETER INSERTION;  Surgeon: Katha Cabal, MD;  Location: [place] [facility];  Service: Cardiovascular;  Laterality: N/A;  . DIALYSIS/PERMA CATHETER REMOVAL N/A 05/12/2018   Procedure: DIALYSIS/PERMA CATHETER REMOVAL;  Surgeon: Algernon Huxley, MD;  Location: [place] [facility];  Service: Cardiovascular;  Laterality: N/A;     Prior to Admission medications   Medication Sig Start Date End Date Taking? Authorizing Provider  ALPRAZolam Duanne Moron) 0.25 MG tablet Take 1 tablet by mouth 2 (two) times daily. 05/02/18   [provider]  amLODipine (NORVASC) 10 MG tablet Take 1 tablet (10 mg total) by mouth daily. 10/18/17   Epifanio Lesches, MD  calcium acetate (PHOSLO) 667 MG capsule Take 2 capsules (1,334 mg total) by mouth 3 (three) times daily with meals. 10/18/17   Epifanio Lesches, MD  calcium carbonate (TUMS - DOSED IN MG ELEMENTAL CALCIUM) 500 MG chewable tablet Chew 1 tablet by mouth 2 (two) times daily.    [provider]  cloNIDine (CATAPRES) 0.2 MG tablet Take 1 tablet (0.2 mg total) by mouth 2 (two) times daily. 05/25/18   Epifanio Lesches, MD  furosemide (LASIX) 80 MG tablet Take 1 tablet by mouth daily. 04/29/18   [provider]  isosorbide mononitrate (IMDUR) 30 MG 24 hr tablet Take 1 tablet (30 mg total) by mouth daily. 04/04/18   Hillary Bow, MD  losartan (COZAAR) 100 MG tablet Take 1 tablet (100 mg total) by mouth daily. 10/18/17   Epifanio Lesches, MD  metoprolol tartrate (LOPRESSOR) 50 MG tablet Take 1 tablet (50 mg total) by mouth 2 (two) times daily. 04/04/18   Gladstone Lighter, MD  MINOXIDIL PO Take by mouth.    [provider]  multivitamin (RENA-VIT) TABS tablet Take 1 tablet by mouth at bedtime. 10/18/17   Epifanio Lesches, MD  Nutritional Supplements (FEEDING SUPPLEMENT, NEPRO CARB STEADY,) LIQD Take  237 mLs by mouth 2 (two) times daily between meals. 10/18/17   Epifanio Lesches, MD  omeprazole (PRILOSEC) 20 MG capsule Take 20 mg by mouth daily.    [provider]  oxyCODONE-acetaminophen (PERCOCET) 5-325 MG tablet Take 1 tablet by mouth every 4 (four) hours as needed for moderate pain or severe pain. 05/25/18 05/25/19  Epifanio Lesches, MD  predniSONE (DELTASONE) 20  MG tablet Take 1.5 tablets (30 mg total) by mouth daily with breakfast. Patient taking differently: Take 20 mg by mouth daily with breakfast.  10/19/17   Epifanio Lesches, MD  rOPINIRole (REQUIP) 0.5 MG tablet Take 1 tablet by mouth at bedtime. 03/21/18   [provider]  sulfamethoxazole-trimethoprim (BACTRIM) 400-80 MG tablet Take 1 tablet by mouth daily. 05/25/18   Epifanio Lesches, MD     Allergies Patient has no known allergies.   Family History  Problem Relation Age of Onset  . Varicose Veins Neg Hx   . Vision loss Neg Hx   . Stroke Neg Hx     Social History Social History   Tobacco Use  . Smoking status: Current Every Day Smoker    Packs/day: 0.25    Types: Cigarettes  . Smokeless tobacco: Never Used  Substance Use Topics  . Alcohol use: Yes  . Drug use: Yes    Types: Marijuana, Cocaine    Review of Systems  Constitutional:   Positive fever ENT:   No sore throat. No rhinorrhea. Cardiovascular:   No chest pain or syncope. Respiratory:   No dyspnea or cough. Gastrointestinal:    positive abdominal pain as above with vomiting.  No constipation Musculoskeletal:   Negative for focal pain or swelling All other systems reviewed and are negative except as documented above in ROS and HPI.  ____________________________________________   PHYSICAL EXAM:  VITAL SIGNS: ED Triage Vitals  Enc Vitals Group     BP 05/29/18 1626 105/81     Pulse Rate 05/29/18 1626 (!) 106     Resp 05/29/18 1626 16     Temp 05/29/18 1626 99.5 F (37.5 C)     Temp Source 05/29/18 1626 Oral     SpO2 05/29/18 1626 92 %     Weight 05/29/18 1625 172 lb 6.4 oz (78.2 kg)     Height 05/29/18 1625 6' (1.829 m)     Head Circumference --      Peak Flow --      Pain Score 05/29/18 1625 9     Pain Loc --      Pain Edu? --      Excl. in [place]? --     Vital signs reviewed, nursing assessments reviewed.   Constitutional:   Alert and oriented. Non-toxic appearance. Eyes:    Conjunctivae are normal. EOMI. PERRL. ENT      Head:   Normocephalic and atraumatic.      Nose:   No congestion/rhinnorhea.       Mouth/Throat:   MMM, no pharyngeal erythema. No peritonsillar mass.       Neck:   No meningismus. Full ROM. Hematological/Lymphatic/Immunilogical:   No cervical lymphadenopathy. Cardiovascular:   RRR. Symmetric bilateral radial and DP pulses.  No murmurs. Cap refill less than 2 seconds. Respiratory:   Normal respiratory effort without tachypnea/retractions. Breath sounds are clear and equal bilaterally. No wheezes/rales/rhonchi. Gastrointestinal:   Soft with severe tenderness around his catheter site in the suprapubic region.  Also diffuse tenderness along the right abdomen.. Non distended. There is no CVA tenderness.  No rebound, rigidity, or guarding. Musculoskeletal:   Normal range of motion in all extremities. No joint effusions.  No lower extremity tenderness.  No edema. Neurologic:   Normal speech and language.  Motor grossly intact. No acute focal neurologic deficits are appreciated.  Skin:    Skin is warm, dry and intact. No rash noted.  No petechiae, purpura, or bullae.  ____________________________________________    LABS (pertinent positives/negatives) (all labs ordered are listed, but only abnormal results are displayed) Labs Reviewed  LIPASE, BLOOD - Abnormal; Notable for the following components:      Result Value   Lipase 79 (*)    All other components within normal limits  COMPREHENSIVE METABOLIC PANEL - Abnormal; Notable for the following components:   Sodium 134 (*)    Chloride 94 (*)    BUN 88 (*)    Creatinine, Ser 13.95 (*)    Calcium 7.5 (*)    Total Protein 6.4 (*)    GFR calc non Af Amer 4 (*)    GFR calc Af Amer 5 (*)    Anion gap 17 (*)    All other components within normal limits  CBC - Abnormal; Notable for the following components:   WBC 16.6 (*)    RBC 2.58 (*)    Hemoglobin 8.0 (*)    HCT 25.9 (*)    MCV 100.4 (*)     RDW 17.5 (*)    All other components within normal limits  BODY FLUID CULTURE  GRAM STAIN  URINALYSIS, COMPLETE (UACMP) WITH MICROSCOPIC  INFLUENZA PANEL BY PCR (TYPE A & B)  PHOSPHORUS  MAGNESIUM   ____________________________________________   EKG    ____________________________________________    RADIOLOGY  No results found.  ____________________________________________   PROCEDURES Procedures  ____________________________________________  DIFFERENTIAL DIAGNOSIS   Bacterial peritonitis, catheter tunnel infection, other PD complication, influenza, viral gastritis  CLINICAL IMPRESSION / ASSESSMENT AND PLAN / ED COURSE  Pertinent labs & imaging results that were available during my care of the patient were reviewed by me and considered in my medical decision making (see chart for details).    Patient presents with worsening abdominal pain, now febrile to 101, mildly tachycardic.  Blood pressure and respiratory status are normal.  Patient given 2 mg of IV Dilaudid for pain control.  Check a flu test.  Discussed with Dr. Zollie Scale from nephrology.  Recommends repeating CT scan without contrast, broadening antibiotics, admission to the hospital for further work-up.     ____________________________________________   FINAL CLINICAL IMPRESSION(S) / ED DIAGNOSES    Final diagnoses:  Generalized abdominal pain  Fever, unspecified fever cause  Peritoneal dialysis catheter tunnel infection, subsequent encounter [facility])     ED Discharge Orders    None      Portions of this note were generated with dragon dictation software. Dictation errors may occur despite best attempts at proofreading.   Carrie Mew, MD 05/29/18 [id number]

## 2018-05-29 NOTE — ED Notes (Signed)
Patient reports does not make urine all the time. Will let us know if he has to void.

## 2018-05-29 NOTE — ED Notes (Signed)
Patient reports sen in ed for abd pain Friday. States sent home with peritoneal dialysis, reports ever since they addedd something to his dialysisi he has been having pain. Also present with fever. States has been on ABX since Saturday and compliant with anti-botics. Awaiting md eval.

## 2018-05-29 NOTE — ED Notes (Signed)
Pt comes into the ED via EMS from home with c/o umbilical pain. Reports her was admitted for hernia but was admitted with peritonitis.

## 2018-05-30 DIAGNOSIS — J189 Pneumonia, unspecified organism: Secondary | ICD-10-CM | POA: Diagnosis present

## 2018-05-30 LAB — BASIC METABOLIC PANEL
Anion gap: 17 — ABNORMAL HIGH (ref 5–15)
BUN: 94 mg/dL — ABNORMAL HIGH (ref 6–20)
CO2: 22 mmol/L (ref 22–32)
Calcium: 7 mg/dL — ABNORMAL LOW (ref 8.9–10.3)
Chloride: 94 mmol/L — ABNORMAL LOW (ref 98–111)
Creatinine, Ser: 14.75 mg/dL — ABNORMAL HIGH (ref 0.61–1.24)
GFR calc Af Amer: 4 mL/min — ABNORMAL LOW (ref >60)
GFR calc non Af Amer: 4 mL/min — ABNORMAL LOW (ref >60)
Glucose, Bld: 118 mg/dL — ABNORMAL HIGH (ref 70–99)
Potassium: 4.2 mmol/L (ref 3.5–5.1)
Sodium: 133 mmol/L — ABNORMAL LOW (ref 135–145)

## 2018-05-30 LAB — CBC
HCT: 25 % — ABNORMAL LOW (ref 39.0–52.0)
Hemoglobin: 7.5 g/dL — ABNORMAL LOW (ref 13.0–17.0)
MCH: 30.2 pg (ref 26.0–34.0)
MCHC: 30 g/dL (ref 30.0–36.0)
MCV: 100.8 fL — ABNORMAL HIGH (ref 80.0–100.0)
NRBC: 0 % (ref 0.0–0.2)
PLATELETS: 171 10*3/uL (ref 150–400)
RBC: 2.48 MIL/uL — ABNORMAL LOW (ref 4.22–5.81)
RDW: 17.4 % — AB (ref 11.5–15.5)
WBC: 18 10*3/uL — ABNORMAL HIGH (ref 4.0–10.5)

## 2018-05-30 LAB — BODY FLUID CELL COUNT WITH DIFFERENTIAL
LYMPHS FL: 69 %
Monocyte-Macrophage-Serous Fluid: 24 %
Neutrophil Count, Fluid: 7 %
Total Nucleated Cell Count, Fluid: 17 cu mm

## 2018-05-30 LAB — GRAM STAIN

## 2018-05-30 LAB — MAGNESIUM: Magnesium: 1.2 mg/dL — ABNORMAL LOW (ref 1.7–2.4)

## 2018-05-30 LAB — PATHOLOGIST SMEAR REVIEW

## 2018-05-30 LAB — PHOSPHORUS: Phosphorus: 6.5 mg/dL — ABNORMAL HIGH (ref 2.5–4.6)

## 2018-05-30 MED ORDER — ONDANSETRON HCL 4 MG/2ML IJ SOLN
4.0000 mg | Freq: Four times a day (QID) | INTRAMUSCULAR | Status: DC | PRN
  Administered 2018-05-30: 4 mg via INTRAVENOUS
  Filled 2018-05-30: qty 2

## 2018-05-30 MED ORDER — CLONIDINE HCL 0.1 MG PO TABS
0.2000 mg | ORAL_TABLET | Freq: Two times a day (BID) | ORAL | Status: DC
  Administered 2018-05-30 – 2018-06-01 (×5): 0.2 mg via ORAL
  Filled 2018-05-30 (×5): qty 2

## 2018-05-30 MED ORDER — METOPROLOL TARTRATE 50 MG PO TABS
50.0000 mg | ORAL_TABLET | Freq: Two times a day (BID) | ORAL | Status: DC
  Administered 2018-05-30 – 2018-06-01 (×6): 50 mg via ORAL
  Filled 2018-05-30 (×6): qty 1

## 2018-05-30 MED ORDER — OXYCODONE-ACETAMINOPHEN 5-325 MG PO TABS
1.0000 | ORAL_TABLET | ORAL | Status: DC | PRN
  Administered 2018-05-30 – 2018-06-01 (×6): 1 via ORAL
  Filled 2018-05-30 (×6): qty 1

## 2018-05-30 MED ORDER — VITAMIN D (ERGOCALCIFEROL) 1.25 MG (50000 UNIT) PO CAPS
50000.0000 [IU] | ORAL_CAPSULE | ORAL | Status: DC
  Administered 2018-05-30: 50000 [IU] via ORAL
  Filled 2018-05-30: qty 1

## 2018-05-30 MED ORDER — PREDNISONE 20 MG PO TABS
20.0000 mg | ORAL_TABLET | Freq: Every day | ORAL | Status: DC
  Administered 2018-05-30 – 2018-06-01 (×3): 20 mg via ORAL
  Filled 2018-05-30 (×3): qty 1

## 2018-05-30 MED ORDER — ACETAMINOPHEN 650 MG RE SUPP: 650.0000 mg | Freq: Four times a day (QID) | RECTAL | Status: DC | PRN

## 2018-05-30 MED ORDER — METRONIDAZOLE 500 MG PO TABS
500.0000 mg | ORAL_TABLET | Freq: Three times a day (TID) | ORAL | Status: DC
  Administered 2018-05-31 – 2018-06-01 (×4): 500 mg via ORAL
  Filled 2018-05-30 (×6): qty 1

## 2018-05-30 MED ORDER — ISOSORBIDE MONONITRATE ER 30 MG PO TB24
30.0000 mg | ORAL_TABLET | Freq: Every day | ORAL | Status: DC
  Administered 2018-05-30 – 2018-06-01 (×3): 30 mg via ORAL
  Filled 2018-05-30 (×3): qty 1

## 2018-05-30 MED ORDER — SODIUM CHLORIDE 0.9 % IV SOLN
2.0000 g | Freq: Two times a day (BID) | INTRAVENOUS | Status: DC
  Administered 2018-05-30 – 2018-05-31 (×3): 2 g via INTRAVENOUS
  Filled 2018-05-30 (×4): qty 2

## 2018-05-30 MED ORDER — CALCIUM ACETATE (PHOS BINDER) 667 MG PO CAPS
1334.0000 mg | ORAL_CAPSULE | Freq: Three times a day (TID) | ORAL | Status: DC
  Administered 2018-05-30 – 2018-06-01 (×8): 1334 mg via ORAL
  Filled 2018-05-30 (×8): qty 2

## 2018-05-30 MED ORDER — ALPRAZOLAM 0.25 MG PO TABS
0.2500 mg | ORAL_TABLET | Freq: Two times a day (BID) | ORAL | Status: DC
  Administered 2018-05-30 – 2018-06-01 (×6): 0.25 mg via ORAL
  Filled 2018-05-30 (×7): qty 1

## 2018-05-30 MED ORDER — RENA-VITE PO TABS: 1.0000 | ORAL_TABLET | Freq: Every day | ORAL | Status: DC

## 2018-05-30 MED ORDER — GENTAMICIN SULFATE 0.1 % EX CREA
1.0000 "application " | TOPICAL_CREAM | Freq: Every day | CUTANEOUS | Status: DC
  Administered 2018-05-30 – 2018-06-01 (×3): 1 via TOPICAL
  Filled 2018-05-30: qty 15

## 2018-05-30 MED ORDER — PANTOPRAZOLE SODIUM 20 MG PO TBEC
20.0000 mg | DELAYED_RELEASE_TABLET | Freq: Every day | ORAL | Status: DC
  Administered 2018-05-30 – 2018-06-01 (×3): 20 mg via ORAL
  Filled 2018-05-30 (×3): qty 1

## 2018-05-30 MED ORDER — CALCIUM CARBONATE ANTACID 500 MG PO CHEW
1.0000 | CHEWABLE_TABLET | Freq: Two times a day (BID) | ORAL | Status: DC
  Administered 2018-05-30 – 2018-06-01 (×6): 200 mg via ORAL
  Filled 2018-05-30 (×6): qty 1

## 2018-05-30 MED ORDER — NEPRO/CARBSTEADY PO LIQD
237.0000 mL | Freq: Three times a day (TID) | ORAL | Status: DC
  Administered 2018-05-30 – 2018-06-01 (×6): 237 mL via ORAL

## 2018-05-30 MED ORDER — RENA-VITE PO TABS
1.0000 | ORAL_TABLET | Freq: Every day | ORAL | Status: DC
  Administered 2018-05-31 – 2018-06-01 (×2): 1 via ORAL
  Filled 2018-05-30 (×2): qty 1

## 2018-05-30 MED ORDER — SODIUM CHLORIDE 0.9 % IV SOLN
INTRAVENOUS | Status: DC | PRN
  Administered 2018-05-30 – 2018-05-31 (×6): 250 mL via INTRAVENOUS

## 2018-05-30 MED ORDER — ACETAMINOPHEN 325 MG PO TABS: 650.0000 mg | ORAL_TABLET | Freq: Four times a day (QID) | ORAL | Status: DC | PRN

## 2018-05-30 MED ORDER — ROPINIROLE HCL 1 MG PO TABS
0.5000 mg | ORAL_TABLET | Freq: Every day | ORAL | Status: DC
  Administered 2018-05-30 – 2018-05-31 (×2): 0.5 mg via ORAL
  Filled 2018-05-30 (×2): qty 1

## 2018-05-30 MED ORDER — ONDANSETRON HCL 4 MG PO TABS: 4.0000 mg | ORAL_TABLET | Freq: Four times a day (QID) | ORAL | Status: DC | PRN

## 2018-05-30 MED ORDER — FUROSEMIDE 40 MG PO TABS
80.0000 mg | ORAL_TABLET | Freq: Every day | ORAL | Status: DC
  Administered 2018-05-30 – 2018-06-01 (×3): 80 mg via ORAL
  Filled 2018-05-30 (×3): qty 2

## 2018-05-30 MED ORDER — POLYETHYLENE GLYCOL 3350 17 G PO PACK
17.0000 g | PACK | Freq: Two times a day (BID) | ORAL | Status: DC
  Administered 2018-05-30 – 2018-06-01 (×4): 17 g via ORAL
  Filled 2018-05-30 (×4): qty 1

## 2018-05-30 MED ORDER — HEPARIN 1000 UNIT/ML FOR PERITONEAL DIALYSIS
500.0000 [IU] | INTRAMUSCULAR | Status: DC | PRN
  Filled 2018-05-30: qty 0.5

## 2018-05-30 MED ORDER — LOSARTAN POTASSIUM 50 MG PO TABS
100.0000 mg | ORAL_TABLET | Freq: Every day | ORAL | Status: DC
  Administered 2018-05-30 – 2018-06-01 (×3): 100 mg via ORAL
  Filled 2018-05-30 (×3): qty 2

## 2018-05-30 MED ORDER — AMLODIPINE BESYLATE 10 MG PO TABS
10.0000 mg | ORAL_TABLET | Freq: Every day | ORAL | Status: DC
  Administered 2018-05-30 – 2018-06-01 (×3): 10 mg via ORAL
  Filled 2018-05-30 (×3): qty 1

## 2018-05-30 MED ORDER — VITAMIN C 500 MG PO TABS
500.0000 mg | ORAL_TABLET | Freq: Two times a day (BID) | ORAL | Status: DC
  Administered 2018-05-30 – 2018-06-01 (×5): 500 mg via ORAL
  Filled 2018-05-30 (×5): qty 1

## 2018-05-30 MED ORDER — HYDROMORPHONE HCL 1 MG/ML IJ SOLN
0.5000 mg | INTRAMUSCULAR | Status: DC | PRN
  Administered 2018-05-30 – 2018-06-01 (×13): 0.5 mg via INTRAVENOUS
  Filled 2018-05-30 (×13): qty 0.5

## 2018-05-30 MED ORDER — HEPARIN SODIUM (PORCINE) 5000 UNIT/ML IJ SOLN
5000.0000 [IU] | Freq: Three times a day (TID) | INTRAMUSCULAR | Status: DC
  Administered 2018-05-30 – 2018-06-01 (×7): 5000 [IU] via SUBCUTANEOUS
  Filled 2018-05-30 (×7): qty 1

## 2018-05-30 NOTE — Progress Notes (Signed)
[facility], [place] 05/30/18  Subjective:  Patient very well known to [place].   Presents again with point pain in the RLQ.  He's had multiple CT scans.  Fat containing umbilical hernia noted.  Previously he had an [place] which showed some fluid around the catheter. He's been tested for peritonitis twice which has been negative.  He feels the pain most when he has day time dwell fluid in place in the form of extraneal.     Objective:  Vital signs in last 24 hours:  Temp:  [98.3 F (36.8 C)-101.7 F (38.7 C)] 98.8 F (37.1 C) (12/27 1159) Pulse Rate:  [81-106] 81 (12/27 1159) Resp:  [16-20] 20 (12/27 1159) BP: (105-149)/(66-91) 125/70 (12/27 1159) SpO2:  [78 %-99 %] 93 % (12/27 1159) Weight:  [78.2 kg] 78.2 kg (12/26 1625)  Weight change:  Filed Weights   05/29/18 1625  Weight: 78.2 kg    Intake/Output:    Intake/Output Summary (Last 24 hours) at 05/30/2018 1508 Last data filed at 05/30/2018 1040 Gross per 24 hour  Intake -  Output 1069 ml  Net -1069 ml     Physical Exam: General:  Well-appearing, laying in the bed, no acute distress  HEENT  moist oral mucous membranes  Neck  supple  Pulm/lungs  normal breathing effort, clear to auscultation bilaterally  CVS/Heart  regular rhythm  Abdomen:   Right lower quadrant superficial tenderness overlying track of PD catheter  Extremities:  No peripheral edema  Neurologic:  Alert, oriented  Skin:  No acute rashes  Access:  PD catheter       Basic Metabolic Panel:  Recent Labs  Lab 05/23/18 2251 05/24/18 0312 05/25/18 0507 05/29/18 1628 05/30/18 0358  NA 140  --  134* 134* 133*  K 4.4  --  4.0 4.3 4.2  CL 97*  --  93* 94* 94*  CO2 21*  --  21* 23 22  GLUCOSE 99  --  88 92 118*  BUN 99*  --  100* 88* 94*  CREATININE 16.60*  --  15.53* 13.95* 14.75*  CALCIUM 7.9*  --  7.4* 7.5* 7.0*  MG  --  1.7  --  1.2*  --   PHOS  --  11.2*  --  6.5*  --      CBC: Recent Labs  Lab  05/23/18 2251 05/25/18 1136 05/29/18 1628 05/30/18 0358  WBC 14.9* 12.8* 16.6* 18.0*  HGB 7.7* 8.7* 8.0* 7.5*  HCT 25.1* 27.4* 25.9* 25.0*  MCV 100.8* 97.9 100.4* 100.8*  PLT 171 196 163 171      Lab Results  Component Value Date   HEPBSAG Negative 10/10/2017   HEPBSAG Negative 10/10/2017   HEPBSAB Reactive 10/10/2017   HEPBIGM Negative 10/10/2017      Microbiology:  Recent Results (from the past 240 hour(s))  MRSA PCR Screening     Status: None   Collection Time: 05/24/18  2:29 AM  Result Value Ref Range Status   MRSA by PCR NEGATIVE NEGATIVE Final    Comment:        The GeneXpert MRSA Assay (FDA approved for NASAL specimens only), is one component of a comprehensive MRSA colonization surveillance program. It is not intended to diagnose MRSA infection nor to guide or monitor treatment for MRSA infections. Performed at [facility], [address] [place], [place] [address]   Body fluid culture     Status: None   Collection Time: 05/24/18  9:35 AM  Result  Value Ref Range Status   Specimen Description   Final    PERITONEAL Performed at [facility], [address], [place], [place] [address]    Special Requests   Final    Normal Performed at [facility], [address] [place], [place] [address]    Gram Stain   Final    NO WBC SEEN NO ORGANISMS SEEN Performed at [facility], [place] [address], [place], [place] [address]    Culture NO GROWTH  Final   Report Status 05/27/2018 FINAL  Final  Body fluid culture     Status: None (Preliminary result)   Collection Time: 05/30/18  2:00 AM  Result Value Ref Range Status   Specimen Description   Final    PERITONEAL DIALYSATE Performed at [facility], [address] [place], [place] [address]    Special Requests   Final    Immunocompromised Performed at [facility], [address] [place], [place] [address]    Gram Stain   Final    NO WBC SEEN NO  ORGANISMS SEEN Performed at [facility], [place] [address] [place], [place] [address]    Culture PENDING  Incomplete   Report Status PENDING  Incomplete  Gram stain     Status: None   Collection Time: 05/30/18  2:00 AM  Result Value Ref Range Status   Specimen Description PERITONEAL DIALYSATE  Final   Special Requests Immunocompromised  Final   Gram Stain   Final    RARE WBC SEEN NO ORGANISMS SEEN Performed at [facility], [address] [place],  [address]    Report Status 05/30/2018 FINAL  Final    Coagulation Studies: No results for input(s): LABPROT, INR in the last 72 hours.  Urinalysis: No results for input(s): COLORURINE, LABSPEC, PHURINE, GLUCOSEU, HGBUR, BILIRUBINUR, KETONESUR, PROTEINUR, UROBILINOGEN, NITRITE, LEUKOCYTESUR in the last 72 hours.  Invalid input(s): APPERANCEUR    Imaging: Ct Abdomen Pelvis Wo Contrast  Result Date: 05/29/2018 CLINICAL DATA:  Abdominal pain for 2-3 weeks EXAM: CT ABDOMEN AND PELVIS WITHOUT CONTRAST TECHNIQUE: Multidetector CT imaging of the abdomen and pelvis was performed following the standard protocol without IV contrast. COMPARISON:  05/24/2018 FINDINGS: Lower chest: Lung bases demonstrate some patchy infiltrative change in the left lower lobe slightly increased from the prior exam. Hepatobiliary: No focal liver abnormality is seen. No gallstones, gallbladder wall thickening, or biliary dilatation. Pancreas: Unremarkable. No pancreatic ductal dilatation or surrounding inflammatory changes. Spleen: Normal in size without focal abnormality. Adrenals/Urinary Tract: Adrenal glands are within normal limits. Kidneys are well visualized bilaterally. No obstructive changes are seen. No renal calculi are noted. The bladder is decompressed. Stomach/Bowel: Scattered diverticular change in the sigmoid is noted without evidence of diverticulitis. Changes of prior appendectomy are seen. No small bowel dilatation is noted.  Vascular/Lymphatic: No significant vascular findings are present. No enlarged abdominal or pelvic lymph nodes. Reproductive: Prostate is unremarkable. Other: Peritoneal dialysis catheter is noted deep within the pelvis. Free fluid is noted within the abdomen and pelvis likely related to dialysate. Small fat containing hernia is again noted at the umbilicus. Musculoskeletal: No acute bony abnormality is noted. Previously seen hip effusions bilaterally have resolved. IMPRESSION: Free fluid within the abdomen consistent with peritoneal dialysate. Mild left lower lobe pneumonia. Diverticulosis without diverticulitis. Electronically Signed   By: Inez Catalina M.D.   On: 05/29/2018 23:30     Medications:   . sodium chloride 250 mL (05/30/18 1412)  .  ceFEPime (MAXIPIME) IV 2 g (05/30/18 1414)  . metronidazole 500 mg (05/30/18 1002)   . ALPRAZolam  0.25 mg Oral BID  . amLODipine  10 mg Oral Daily  . calcium acetate  1,334 mg Oral TID WC  . calcium carbonate  1 tablet Oral BID  . cloNIDine  0.2 mg Oral BID  . feeding supplement (NEPRO CARB STEADY)  237 mL Oral TID WC  . furosemide  80 mg Oral Daily  . gentamicin cream  1 application Topical Daily  . heparin  5,000 Units Subcutaneous Q8H  . isosorbide mononitrate  30 mg Oral Daily  . losartan  100 mg Oral Daily  . metoprolol tartrate  50 mg Oral BID  . [START ON 05/31/2018] metroNIDAZOLE  500 mg Oral Q8H  . [START ON 05/31/2018] multivitamin  1 tablet Oral QHS  . pantoprazole  20 mg Oral Daily  . polyethylene glycol  17 g Oral BID  . predniSONE  20 mg Oral Q breakfast  . rOPINIRole  0.5 mg Oral QHS  . vitamin C  500 mg Oral BID  . Vitamin D (Ergocalciferol)  50,000 Units Oral Q7 days   sodium chloride, acetaminophen **OR** acetaminophen, heparin, HYDROmorphone (DILAUDID) injection, ondansetron **OR** ondansetron (ZOFRAN) IV, oxyCODONE-acetaminophen  Assessment/ Plan:  32 y.o. caucasian male with end stage renal disease on hemodialysis,  hypertension, crescentic glomerulonephritis, who was admitted to [facility] for abdominal pain  CCPD/ Dr. Holley Raring 9 hrs/ 2 x 2500 cc/ last fill icodextrin 2000 cc  1.  End-stage renal disease 2.  Anemia chronic kidney disease 3.  Secondary hyperparathyroidism 4.  Abdominal pain 5.  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  Hypertension, severe with CKD  Plan: The patient presents again with recurrent right lower quadrant abdominal pain.  The pain appears to overlie tract of the catheter in its superficial location.  This appears to be exacerbated when he has a daytime dwell in place.  He does not experience this pain at night.  I've discussed this with the surgeon who placed his PD catheter.  He will evaluate the patient next week in [facility].  Pt has been tested for peritonitis twice recently and has been negative both times.   Until then we will maintain the patient on his peritoneal dialysis prescription.  Hold extra meal while he is admitted.  He was also found to have pneumonia which is being treated with antibiotics.  Consider discharge tomorrow.   LOS: 1 Corina Stacy 12/27/20193:08 PM  [place], [place]  Note: This note was prepared with Dragon dictation. Any transcription errors are unintentional

## 2018-05-30 NOTE — Care Management (Signed)
Notified Dell Ponto, Dialysis Liaison from patient pathways of admission.

## 2018-05-30 NOTE — Progress Notes (Signed)
PD tx start. Pre PD weight 80.5kg, exit site care performed.    05/30/18 1957  Peritoneal Catheter Right lower abdomen  Placement Date/Time: (c) 03/31/18 0800   Catheter Location: Right lower abdomen  Site Assessment Clean;Dry;Intact  Catheter status Accessed  Dressing Gauze/Drain sponge  Dressing Status Clean;Dry;Intact  Cycler Setup  Total Number of Exchanges 4  Fill Volume 2300  Dianeal Solution Dextrose 2.5% in 6000 mL  Last Fill Volume 0  Fill Time - Minute(s) 10  Drain Time - Minute(s) 20 mins  Completion  Exit Site Care Performed Yes  Treatment Status Started  Procedure Comments  Peritoneal Dialysis Comments pre PD weight 80.5kg  Education / Care Plan  Dialysis Education Provided Yes  Documented Education in Care Plan Yes  Hand-Off documentation  Report given to (Full Name) [facility]  Report received from (Full Name) Stark Bray

## 2018-05-30 NOTE — Progress Notes (Signed)
Initial Nutrition Assessment  DOCUMENTATION CODES:   Not applicable  INTERVENTION:   Nepro Shake po TID, each supplement provides 425 kcal and 19 grams protein  Rena-vite daily   Vitamin C '500mg'$  po BID  Liberalize diet   Ergocalciferol 50,000 units weekly  NUTRITION DIAGNOSIS:   Increased nutrient needs related to chronic illness(ESRD on dialysis ) as evidenced by increased estimated needs.  GOAL:   Patient will meet greater than or equal to 90% of their needs  MONITOR:   PO intake, Supplement acceptance, Labs, Weight trends, Skin, I & O's  REASON FOR ASSESSMENT:   Malnutrition Screening Tool    ASSESSMENT:   32 y/o male with ESRD secondary to glomerulonephritis on PD but previously on HD, GERD admitted with peritonitis and HCAP     Met with pt in room today. RD familiar with this patient from previous admits. Pt previously on HD about 1-2 months ago now on PD at home. Pt is generally a good eater but reports poor appetite and oral intake for several months now. Per chart, pt has lost ~15lbs(8%) over the past 3 months which is significant given the time frame. Pt does enjoy vanilla Nepro. Pt does not take any vitamins at home. Pt encouraged to drink supplements at home and take a daily renal multivitamin every morning after PD. Pt noted to have low vitamin D on 12/21; will start ergocalciferol 50,000 units weekly. RD will order supplements and vitamins to help pt meet his estimated needs and replace losses from PD. RD will also liberalize pt's diet and add a phosphorus restriction through health touch. Pt only ate bites of his breakfast today and did not order any lunch. Pt likely at refeed risk. Magnesium low 12/26; spoke to pharmacy about rechecking this lab tomorrow.   Medications reviewed and include: phoslo, tums, lasix, heparin, protonix, prednisone, cefepime, metronidazole, hydromorphone    Labs reviewed: Na 133(L), Cl 94(L), BUN 94(H), creat 14.75(H) P 6.5(H), Mg  1.2(L)- 12/26 Wbc- 18.0(H), Hgb 7.5(L), Hct 25.0(L), MCV 100.8(H) Vitamin D 24.1(L)- 12/21 iPTH- 92(H)- 12/21  NUTRITION - FOCUSED PHYSICAL EXAM:    Most Recent Value  Orbital Region  No depletion  Upper Arm Region  Mild depletion  Thoracic and Lumbar Region  Mild depletion  Buccal Region  Mild depletion  Temple Region  No depletion  Clavicle Bone Region  No depletion  Clavicle and Acromion Bone Region  No depletion  Scapular Bone Region  No depletion  Dorsal Hand  No depletion  Patellar Region  Mild depletion  Anterior Thigh Region  Mild depletion  Posterior Calf Region  Mild depletion  Edema (RD Assessment)  None  Hair  Reviewed  Eyes  Reviewed  Mouth  Reviewed  Skin  Reviewed  Nails  Reviewed     Diet Order:   Diet Order            Diet regular Room service appropriate? Yes; Fluid consistency: Thin; Fluid restriction: 1200 mL Fluid  Diet effective now             EDUCATION NEEDS:   Education needs have been addressed  Skin:  Skin Assessment: Reviewed RN Assessment(ecchymosis )  Last BM:  12/20  Height:   Ht Readings from Last 1 Encounters:  05/29/18 6' (1.829 m)    Weight:   Wt Readings from Last 1 Encounters:  05/29/18 78.2 kg    Ideal Body Weight:  80.9 kg  BMI:  Body mass index is 23.38 kg/m.  Estimated  Nutritional Needs:   Kcal:  2300-2600kcal/day  Protein:  102-117g/day   Fluid:  1.5L/day   Koleen Distance MS, RD, LDN Pager #- [phone] Office#- [phone] After Hours Pager: [phone]

## 2018-05-30 NOTE — ED Notes (Signed)
Report called to Christena Deem

## 2018-05-30 NOTE — Progress Notes (Signed)
Pharmacy Antibiotic Note  Jeffrey Holloway is a 32 y.o. male admitted on 05/29/2018 with intra-abdominal infection.  Pharmacy has been consulted for cefepime dosing.  Plan: CRRT pt Cefepime 2 grams q 12 hours ordered  Height: 6' (182.9 cm) Weight: 172 lb 6.4 oz (78.2 kg) IBW/kg (Calculated) : 77.6  Temp (24hrs), Avg:100.5 F (38.1 C), Min:99.5 F (37.5 C), Max:101.7 F (38.7 C)  Recent Labs  Lab 05/23/18 2251 05/25/18 0507 05/25/18 1136 05/29/18 1628  WBC 14.9*  --  12.8* 16.6*  CREATININE 16.60* 15.53*  --  13.95*    Estimated Creatinine Clearance: 8.2 mL/min (A) (by C-G formula based on SCr of 13.95 mg/dL (H)).    No Known Allergies  Antimicrobials this admission: Cefepime, metronidazole 12/27  >>    >>   Dose adjustments this admission:   Microbiology results: 12/21 Body fluid Cx: no WBC, no organisms 12/21 MRSA PCR: (-)  Thank you for allowing pharmacy to be a part of this patient's care.  Latanya Hemmer S 05/30/2018 2:32 AM

## 2018-05-30 NOTE — Progress Notes (Signed)
PD tx start. Pre PD weight 80.9kg, PD fluid sample X2 taken (cell count and culture), exit site care performed.    05/30/18 0219  Peritoneal Catheter Right lower abdomen  Placement Date/Time: (c) 03/31/18 0800   Catheter Location: Right lower abdomen  Site Assessment Clean;Dry;Intact  Drainage Description Yellow  Catheter status Accessed  Dressing Gauze/Drain sponge  Dressing Status Clean;Dry;Intact  Dressing Intervention New dressing  Cycler Setup  Total Number of Exchanges 4  Fill Volume 2300  Dianeal Solution Dextrose 2.5% in 6000 mL  Last Fill Volume 0  Fill Time - Minute(s) 10  Drain Time - Minute(s) 20 mins  Completion  Exit Site Care Performed Yes  Treatment Status Started  Procedure Comments  Peritoneal Dialysis Comments pre PD weight 80.9kg  Education / Care Plan  Dialysis Education Provided Yes  Documented Education in Care Plan Yes  Hand-Off documentation  Report given to (Full Name) Mirna Mires  Report received from (Full Name) Stark Bray

## 2018-05-30 NOTE — Progress Notes (Signed)
[facility] [place] NAME: Jeffrey Holloway    MR#:  [id number]  DATE OF BIRTH:  1985-08-16  SUBJECTIVE:  CHIEF COMPLAINT: Patient is reporting abdominal pain during peritoneal dialysis, thinks dialysate is causing the discomfort.  Getting PD today during my examination but denies any pain  REVIEW OF SYSTEMS:  CONSTITUTIONAL: No fever, fatigue or weakness.  EYES: No blurred or double vision.  EARS, NOSE, AND THROAT: No tinnitus or ear pain.  RESPIRATORY: No cough, shortness of breath, wheezing or hemoptysis.  CARDIOVASCULAR: No chest pain, orthopnea, edema.  GASTROINTESTINAL: No nausea, vomiting, diarrhea or abdominal pain.  GENITOURINARY: No dysuria, hematuria.  ENDOCRINE: No polyuria, nocturia,  HEMATOLOGY: No anemia, easy bruising or bleeding SKIN: No rash or lesion. MUSCULOSKELETAL: No joint pain or arthritis.   NEUROLOGIC: No tingling, numbness, weakness.  PSYCHIATRY: No anxiety or depression.   DRUG ALLERGIES:  No Known Allergies  VITALS:  Blood pressure 125/70, pulse 81, temperature 98.8 F (37.1 C), temperature source Oral, resp. rate 20, height 6' (1.829 m), weight 78.2 kg, SpO2 93 %.  PHYSICAL EXAMINATION:  GENERAL:  32 y.o.-year-old patient lying in the bed with no acute distress.  EYES: Pupils equal, round, reactive to light and accommodation. No scleral icterus. Extraocular muscles intact.  HEENT: Head atraumatic, normocephalic. Oropharynx and nasopharynx clear.  NECK:  Supple, no jugular venous distention. No thyroid enlargement, no tenderness.  LUNGS: Normal breath sounds bilaterally, no wheezing, rales,rhonchi or crepitation. No use of accessory muscles of respiration.  CARDIOVASCULAR: S1, S2 normal. No murmurs, rubs, or gallops.  ABDOMEN: Soft, right lower quadrant is tender at close to the umbilicus, nondistended. Bowel sounds present.  PD cath is intact the right lower quadrant, not erythematous EXTREMITIES: No  pedal edema, cyanosis, or clubbing.  NEUROLOGIC: Cranial nerves II through XII are intact. Muscle strength 5/5 in all extremities. Sensation intact. Gait not checked.  PSYCHIATRIC: The patient is alert and oriented x 3.  SKIN: No obvious rash, lesion, or ulcer.    LABORATORY PANEL:   CBC Recent Labs  Lab 05/30/18 0358  WBC 18.0*  HGB 7.5*  HCT 25.0*  PLT 171   ------------------------------------------------------------------------------------------------------------------  Chemistries  Recent Labs  Lab 05/29/18 1628 05/30/18 0358  NA 134* 133*  K 4.3 4.2  CL 94* 94*  CO2 23 22  GLUCOSE 92 118*  BUN 88* 94*  CREATININE 13.95* 14.75*  CALCIUM 7.5* 7.0*  MG 1.2*  --   AST 15  --   ALT 11  --   ALKPHOS 40  --   BILITOT 0.7  --    ------------------------------------------------------------------------------------------------------------------  Cardiac Enzymes Recent Labs  Lab 05/23/18 2251  TROPONINI 0.05*   ------------------------------------------------------------------------------------------------------------------  RADIOLOGY:  Ct Abdomen Pelvis Wo Contrast  Result Date: 05/29/2018 CLINICAL DATA:  Abdominal pain for 2-3 weeks EXAM: CT ABDOMEN AND PELVIS WITHOUT CONTRAST TECHNIQUE: Multidetector CT imaging of the abdomen and pelvis was performed following the standard protocol without IV contrast. COMPARISON:  05/24/2018 FINDINGS: Lower chest: Lung bases demonstrate some patchy infiltrative change in the left lower lobe slightly increased from the prior exam. Hepatobiliary: No focal liver abnormality is seen. No gallstones, gallbladder wall thickening, or biliary dilatation. Pancreas: Unremarkable. No pancreatic ductal dilatation or surrounding inflammatory changes. Spleen: Normal in size without focal abnormality. Adrenals/Urinary Tract: Adrenal glands are within normal limits. Kidneys are well visualized bilaterally. No obstructive changes are seen. No renal  calculi are noted. The bladder is decompressed. Stomach/Bowel: Scattered diverticular change  in the sigmoid is noted without evidence of diverticulitis. Changes of prior appendectomy are seen. No small bowel dilatation is noted. Vascular/Lymphatic: No significant vascular findings are present. No enlarged abdominal or pelvic lymph nodes. Reproductive: Prostate is unremarkable. Other: Peritoneal dialysis catheter is noted deep within the pelvis. Free fluid is noted within the abdomen and pelvis likely related to dialysate. Small fat containing hernia is again noted at the umbilicus. Musculoskeletal: No acute bony abnormality is noted. Previously seen hip effusions bilaterally have resolved. IMPRESSION: Free fluid within the abdomen consistent with peritoneal dialysate. Mild left lower lobe pneumonia. Diverticulosis without diverticulitis. Electronically Signed   By: Inez Catalina M.D.   On: 05/29/2018 23:30    EKG:   Orders placed or performed during the hospital encounter of 05/23/18  . EKG 12-Lead  . EKG 12-Lead  . ED EKG within 10 minutes  . ED EKG within 10 minutes    ASSESSMENT AND PLAN:   [age]-year-old male presenting with abdominal pain during peritoneal dialysis.  #Acute peritonitis etiology unclear at this time/probably from dialysate Peritoneal fluid looks straw-colored with WBC 17 and clear appearing Continue peritoneal dialysis per nephrology.  Dr. Zollie Scale is following and if patient is not clinically feeling better he might consider discontinuing PD cath  #Healthcare associated pneumonia Broad-spectrum IV antibiotics cefepime and Flagyl and bronchodilator treatments  #End-stage renal disease on peritoneal dialysis continue per nephrology recommendations  #Essential hypertension-continue home medication Cozaar, metoprolol and clonidine  All the records are reviewed and case discussed with Care Management/Social Workerr. Management plans discussed with the patient, family and they  are in agreement.  CODE STATUS: fc   TOTAL TIME TAKING CARE OF THIS PATIENT: 35 minutes.   POSSIBLE D/C IN 2  DAYS, DEPENDING ON CLINICAL CONDITION.  Note: This dictation was prepared with Dragon dictation along with smaller phrase technology. Any transcriptional errors that result from this process are unintentional.   Nicholes Mango M.D on 05/30/2018 at 3:03 PM  Between 7am to 6pm - Pager - [phone] After 6pm go to [email or web address] - password [email or web address] [facility]  Office  [phone]  CC: Primary care physician; Patient, No Pcp Per

## 2018-05-31 LAB — CBC WITH DIFFERENTIAL/PLATELET
Abs Immature Granulocytes: 0.27 10*3/uL — ABNORMAL HIGH (ref 0.00–0.07)
Basophils Absolute: 0 10*3/uL (ref 0.0–0.1)
Basophils Relative: 0 %
EOS ABS: 0 10*3/uL (ref 0.0–0.5)
Eosinophils Relative: 0 %
HCT: 22.5 % — ABNORMAL LOW (ref 39.0–52.0)
Hemoglobin: 7.1 g/dL — ABNORMAL LOW (ref 13.0–17.0)
Immature Granulocytes: 2 %
LYMPHS ABS: 0.9 10*3/uL (ref 0.7–4.0)
Lymphocytes Relative: 5 %
MCH: 30.2 pg (ref 26.0–34.0)
MCHC: 31.6 g/dL (ref 30.0–36.0)
MCV: 95.7 fL (ref 80.0–100.0)
Monocytes Absolute: 1.1 10*3/uL — ABNORMAL HIGH (ref 0.1–1.0)
Monocytes Relative: 7 %
Neutro Abs: 14.5 10*3/uL — ABNORMAL HIGH (ref 1.7–7.7)
Neutrophils Relative %: 86 %
Platelets: 159 10*3/uL (ref 150–400)
RBC: 2.35 MIL/uL — ABNORMAL LOW (ref 4.22–5.81)
RDW: 16.6 % — AB (ref 11.5–15.5)
Smear Review: NORMAL
WBC: 16.7 10*3/uL — ABNORMAL HIGH (ref 4.0–10.5)
nRBC: 0 % (ref 0.0–0.2)

## 2018-05-31 LAB — MAGNESIUM: Magnesium: 1.7 mg/dL (ref 1.7–2.4)

## 2018-05-31 MED ORDER — GUAIFENESIN-DM 100-10 MG/5ML PO SYRP
5.0000 mL | ORAL_SOLUTION | ORAL | Status: DC | PRN
  Administered 2018-05-31: 5 mL via ORAL
  Filled 2018-05-31: qty 5

## 2018-05-31 MED ORDER — SODIUM CHLORIDE 0.9 % IV SOLN
1.0000 g | INTRAVENOUS | Status: DC
  Administered 2018-05-31: 1 g via INTRAVENOUS
  Filled 2018-05-31 (×2): qty 1

## 2018-05-31 NOTE — Progress Notes (Signed)
Pharmacy Antibiotic Note  Jeffrey Holloway is a 32 y.o. male admitted on 05/29/2018 with intra-abdominal infection.  Pharmacy has been consulted for cefepime dosing.  Plan: Patient is to be maintained on PD until evaluation at [facility] per nephrology. Decrease cefepime to 1 gm IV Q24H since patient is using PD and not CRRT.  Height: 6' (182.9 cm) Weight: 177 lb 8 oz (80.5 kg) IBW/kg (Calculated) : 77.6  Temp (24hrs), Avg:98.2 F (36.8 C), Min:97.5 F (36.4 C), Max:98.8 F (37.1 C)  Recent Labs  Lab 05/25/18 0507 05/25/18 1136 05/29/18 1628 05/30/18 0358 05/31/18 0501  WBC  --  12.8* 16.6* 18.0* 16.7*  CREATININE 15.53*  --  13.95* 14.75*  --     Estimated Creatinine Clearance: 7.7 mL/min (A) (by C-G formula based on SCr of 14.75 mg/dL (H)).    No Known Allergies  Antimicrobials this admission: Cefepime, metronidazole 12/27  >>    >>   Dose adjustments this admission:   Microbiology results: 12/21 Body fluid Cx: no WBC, no organisms 12/21 MRSA PCR: (-)  Thank you for allowing pharmacy to be a part of this patient's care.  Laural Benes, PharmD, BCPS Clinical Pharmacist 05/31/2018 7:47 AM

## 2018-05-31 NOTE — Progress Notes (Signed)
[facility], [place] 05/31/18  Subjective:  Patient resting comfortably in bed. Still having some point tenderness in the tunnel portion of the PD catheter. No sign of peritonitis however.    Objective:  Vital signs in last 24 hours:  Temp:  [97.5 F (36.4 C)-98.3 F (36.8 C)] 97.5 F (36.4 C) (12/28 0524) Pulse Rate:  [66-73] 66 (12/28 0524) Resp:  [16-18] 16 (12/28 0524) BP: (113-123)/(68-76) 113/68 (12/28 0524) SpO2:  [94 %-97 %] 97 % (12/28 0524) Weight:  [77.5 kg-80.5 kg] 77.5 kg (12/28 0824)  Weight change: 2.313 kg Filed Weights   05/29/18 1625 05/31/18 0524 05/31/18 0824  Weight: 78.2 kg 80.5 kg 77.5 kg    Intake/Output:    Intake/Output Summary (Last 24 hours) at 05/31/2018 1257 Last data filed at 05/31/2018 0824 Gross per 24 hour  Intake 372.32 ml  Output 864 ml  Net -491.68 ml     Physical Exam: General:  Well-appearing, laying in the bed, no acute distress  HEENT  moist oral mucous membranes  Neck  supple  Pulm/lungs  normal breathing effort, clear to auscultation bilaterally  CVS/Heart  regular rhythm  Abdomen:   Right lower quadrant superficial tenderness overlying track of PD catheter  Extremities:  No peripheral edema  Neurologic:  Alert, oriented  Skin:  No acute rashes  Access:  PD catheter       Basic Metabolic Panel:  Recent Labs  Lab 05/25/18 0507 05/29/18 1628 05/30/18 0358 05/31/18 0501  NA 134* 134* 133*  --   K 4.0 4.3 4.2  --   CL 93* 94* 94*  --   CO2 21* 23 22  --   GLUCOSE 88 92 118*  --   BUN 100* 88* 94*  --   CREATININE 15.53* 13.95* 14.75*  --   CALCIUM 7.4* 7.5* 7.0*  --   MG  --  1.2*  --  1.7  PHOS  --  6.5*  --   --      CBC: Recent Labs  Lab 05/25/18 1136 05/29/18 1628 05/30/18 0358 05/31/18 0501  WBC 12.8* 16.6* 18.0* 16.7*  NEUTROABS  --   --   --  14.5*  HGB 8.7* 8.0* 7.5* 7.1*  HCT 27.4* 25.9* 25.0* 22.5*  MCV 97.9 100.4* 100.8* 95.7  PLT 196 163 171 159       Lab Results  Component Value Date   HEPBSAG Negative 10/10/2017   HEPBSAG Negative 10/10/2017   HEPBSAB Reactive 10/10/2017   HEPBIGM Negative 10/10/2017      Microbiology:  Recent Results (from the past 240 hour(s))  MRSA PCR Screening     Status: None   Collection Time: 05/24/18  2:29 AM  Result Value Ref Range Status   MRSA by PCR NEGATIVE NEGATIVE Final    Comment:        The GeneXpert MRSA Assay (FDA approved for NASAL specimens only), is one component of a comprehensive MRSA colonization surveillance program. It is not intended to diagnose MRSA infection nor to guide or monitor treatment for MRSA infections. Performed at [facility], [address] [place], [place] [address]   Body fluid culture     Status: None   Collection Time: 05/24/18  9:35 AM  Result Value Ref Range Status   Specimen Description   Final    PERITONEAL Performed at [facility], [address], [place],  [address]    Special Requests   Final    Normal Performed  at [facility], [address] [place], [place] [address]    Gram Stain   Final    NO WBC SEEN NO ORGANISMS SEEN Performed at [facility], [place] [address], [place], [place] [address]    Culture NO GROWTH  Final   Report Status 05/27/2018 FINAL  Final  Body fluid culture     Status: None (Preliminary result)   Collection Time: 05/30/18  2:00 AM  Result Value Ref Range Status   Specimen Description   Final    PERITONEAL DIALYSATE Performed at [facility], [address], [place], [place] [address]    Special Requests   Final    Immunocompromised Performed at [facility], [place], [place] [address]    Gram Stain NO WBC SEEN NO ORGANISMS SEEN   Final   Culture   Final    NO GROWTH 1 DAY Performed at [facility], [place] [address], [place], [place] [address]    Report Status PENDING  Incomplete  Gram stain     Status: None    Collection Time: 05/30/18  2:00 AM  Result Value Ref Range Status   Specimen Description PERITONEAL DIALYSATE  Final   Special Requests Immunocompromised  Final   Gram Stain   Final    RARE WBC SEEN NO ORGANISMS SEEN Performed at [facility], [address] [place],  [address]    Report Status 05/30/2018 FINAL  Final    Coagulation Studies: No results for input(s): LABPROT, INR in the last 72 hours.  Urinalysis: No results for input(s): COLORURINE, LABSPEC, PHURINE, GLUCOSEU, HGBUR, BILIRUBINUR, KETONESUR, PROTEINUR, UROBILINOGEN, NITRITE, LEUKOCYTESUR in the last 72 hours.  Invalid input(s): APPERANCEUR    Imaging: Ct Abdomen Pelvis Wo Contrast  Result Date: 05/29/2018 CLINICAL DATA:  Abdominal pain for 2-3 weeks EXAM: CT ABDOMEN AND PELVIS WITHOUT CONTRAST TECHNIQUE: Multidetector CT imaging of the abdomen and pelvis was performed following the standard protocol without IV contrast. COMPARISON:  05/24/2018 FINDINGS: Lower chest: Lung bases demonstrate some patchy infiltrative change in the left lower lobe slightly increased from the prior exam. Hepatobiliary: No focal liver abnormality is seen. No gallstones, gallbladder wall thickening, or biliary dilatation. Pancreas: Unremarkable. No pancreatic ductal dilatation or surrounding inflammatory changes. Spleen: Normal in size without focal abnormality. Adrenals/Urinary Tract: Adrenal glands are within normal limits. Kidneys are well visualized bilaterally. No obstructive changes are seen. No renal calculi are noted. The bladder is decompressed. Stomach/Bowel: Scattered diverticular change in the sigmoid is noted without evidence of diverticulitis. Changes of prior appendectomy are seen. No small bowel dilatation is noted. Vascular/Lymphatic: No significant vascular findings are present. No enlarged abdominal or pelvic lymph nodes. Reproductive: Prostate is unremarkable. Other: Peritoneal dialysis catheter is noted deep  within the pelvis. Free fluid is noted within the abdomen and pelvis likely related to dialysate. Small fat containing hernia is again noted at the umbilicus. Musculoskeletal: No acute bony abnormality is noted. Previously seen hip effusions bilaterally have resolved. IMPRESSION: Free fluid within the abdomen consistent with peritoneal dialysate. Mild left lower lobe pneumonia. Diverticulosis without diverticulitis. Electronically Signed   By: Inez Catalina M.D.   On: 05/29/2018 23:30     Medications:   . sodium chloride Stopped (05/31/18 0529)  . ceFEPime (MAXIPIME) IV     . ALPRAZolam  0.25 mg Oral BID  . amLODipine  10 mg Oral Daily  . calcium acetate  1,334 mg Oral TID WC  . calcium carbonate  1 tablet Oral BID  . cloNIDine  0.2 mg Oral BID  . feeding supplement (NEPRO CARB STEADY)  237 mL Oral TID WC  . furosemide  80 mg Oral Daily  . gentamicin cream  1 application Topical Daily  . heparin  5,000 Units Subcutaneous Q8H  . isosorbide mononitrate  30 mg Oral Daily  . losartan  100 mg Oral Daily  . metoprolol tartrate  50 mg Oral BID  . metroNIDAZOLE  500 mg Oral Q8H  . multivitamin  1 tablet Oral QHS  . pantoprazole  20 mg Oral Daily  . polyethylene glycol  17 g Oral BID  . predniSONE  20 mg Oral Q breakfast  . rOPINIRole  0.5 mg Oral QHS  . vitamin C  500 mg Oral BID  . Vitamin D (Ergocalciferol)  50,000 Units Oral Q7 days   sodium chloride, acetaminophen **OR** acetaminophen, heparin, HYDROmorphone (DILAUDID) injection, ondansetron **OR** ondansetron (ZOFRAN) IV, oxyCODONE-acetaminophen  Assessment/ Plan:  32 y.o. caucasian male with end stage renal disease on hemodialysis, hypertension, crescentic glomerulonephritis, who was admitted to [facility] for abdominal pain  CCPD/ Dr. Holley Raring 9 hrs/ 2 x 2500 cc/ last fill icodextrin 2000 cc  1.  End-stage renal disease 2.  Anemia chronic kidney disease 3.  Secondary hyperparathyroidism 4.  Abdominal pain overlying tunneled portion of  PD catheter 5.  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  Hypertension, severe with CKD  Plan: The patient continues to have some tenderness along the tract of the PD catheter.  This pain appears to be mechanical in origin.  He does not appear to have infection related to the PD catheter.  No sign of peritonitis.  Continue peritoneal dialysis while admitted.  He will resume with extraneal as an outpatient.  Treatment of pneumonia per hospitalist.  LOS: 2 Jeffrey Holloway 12/28/201912:57 PM  Holloway, Jeffrey  Note: This note was prepared with Dragon dictation. Any transcription errors are unintentional

## 2018-05-31 NOTE — Progress Notes (Signed)
[facility] [place] NAME: Jeffrey Holloway    MR#:  [id number]  DATE OF BIRTH:  Oct 15, 1985  SUBJECTIVE:   Abdominal pain persists.  Shortness of breath is improving.  Cough.  Feels weak.  Poor appetite. Afebrile.  REVIEW OF SYSTEMS:  CONSTITUTIONAL: No fever, fatigue or weakness.  EYES: No blurred or double vision.  EARS, NOSE, AND THROAT: No tinnitus or ear pain.  RESPIRATORY: Often shortness of breath. CARDIOVASCULAR: No chest pain, orthopnea, edema.  GASTROINTESTINAL: No nausea, vomiting, diarrhea .  Abdominal pain present GENITOURINARY: No dysuria, hematuria.  ENDOCRINE: No polyuria, nocturia,  HEMATOLOGY: No anemia, easy bruising or bleeding SKIN: No rash or lesion. MUSCULOSKELETAL: No joint pain or arthritis.   NEUROLOGIC: No tingling, numbness, weakness.  PSYCHIATRY: No anxiety or depression.   DRUG ALLERGIES:  No Known Allergies  VITALS:  Blood pressure 113/68, pulse 66, temperature (!) 97.5 F (36.4 C), temperature source Oral, resp. rate 16, height 6' (1.829 m), weight 77.5 kg, SpO2 97 %.  PHYSICAL EXAMINATION:  GENERAL:  32 y.o.-year-old patient lying in the bed with no acute distress.  EYES: Pupils equal, round, reactive to light and accommodation. No scleral icterus. Extraocular muscles intact.  HEENT: Head atraumatic, normocephalic. Oropharynx and nasopharynx clear.  NECK:  Supple, no jugular venous distention. No thyroid enlargement, no tenderness.  LUNGS: Normal breath sounds bilaterally, no wheezing, rales,rhonchi or crepitation. No use of accessory muscles of respiration.  CARDIOVASCULAR: S1, S2 normal. No murmurs, rubs, or gallops.  ABDOMEN: Soft, right lower quadrant is tender at close to the umbilicus, nondistended. Bowel sounds present.  PD cath is intact the right lower quadrant, not erythematous EXTREMITIES: No pedal edema, cyanosis, or clubbing.  NEUROLOGIC: Cranial nerves II through XII are intact.  Muscle strength 5/5 in all extremities. Sensation intact. Gait not checked.  PSYCHIATRIC: The patient is alert and oriented x 3.  SKIN: No obvious rash, lesion, or ulcer.    LABORATORY PANEL:   CBC Recent Labs  Lab 05/31/18 0501  WBC 16.7*  HGB 7.1*  HCT 22.5*  PLT 159   ------------------------------------------------------------------------------------------------------------------  Chemistries  Recent Labs  Lab 05/29/18 1628 05/30/18 0358 05/31/18 0501  NA 134* 133*  --   K 4.3 4.2  --   CL 94* 94*  --   CO2 23 22  --   GLUCOSE 92 118*  --   BUN 88* 94*  --   CREATININE 13.95* 14.75*  --   CALCIUM 7.5* 7.0*  --   MG 1.2*  --  1.7  AST 15  --   --   ALT 11  --   --   ALKPHOS 40  --   --   BILITOT 0.7  --   --    ------------------------------------------------------------------------------------------------------------------  Cardiac Enzymes No results for input(s): TROPONINI in the last 168 hours. ------------------------------------------------------------------------------------------------------------------  RADIOLOGY:  Ct Abdomen Pelvis Wo Contrast  Result Date: 05/29/2018 CLINICAL DATA:  Abdominal pain for 2-3 weeks EXAM: CT ABDOMEN AND PELVIS WITHOUT CONTRAST TECHNIQUE: Multidetector CT imaging of the abdomen and pelvis was performed following the standard protocol without IV contrast. COMPARISON:  05/24/2018 FINDINGS: Lower chest: Lung bases demonstrate some patchy infiltrative change in the left lower lobe slightly increased from the prior exam. Hepatobiliary: No focal liver abnormality is seen. No gallstones, gallbladder wall thickening, or biliary dilatation. Pancreas: Unremarkable. No pancreatic ductal dilatation or surrounding inflammatory changes. Spleen: Normal in size without focal abnormality. Adrenals/Urinary Tract: Adrenal glands are  within normal limits. Kidneys are well visualized bilaterally. No obstructive changes are seen. No renal calculi are  noted. The bladder is decompressed. Stomach/Bowel: Scattered diverticular change in the sigmoid is noted without evidence of diverticulitis. Changes of prior appendectomy are seen. No small bowel dilatation is noted. Vascular/Lymphatic: No significant vascular findings are present. No enlarged abdominal or pelvic lymph nodes. Reproductive: Prostate is unremarkable. Other: Peritoneal dialysis catheter is noted deep within the pelvis. Free fluid is noted within the abdomen and pelvis likely related to dialysate. Small fat containing hernia is again noted at the umbilicus. Musculoskeletal: No acute bony abnormality is noted. Previously seen hip effusions bilaterally have resolved. IMPRESSION: Free fluid within the abdomen consistent with peritoneal dialysate. Mild left lower lobe pneumonia. Diverticulosis without diverticulitis. Electronically Signed   By: Inez Catalina M.D.   On: 05/29/2018 23:30    EKG:   Orders placed or performed during the hospital encounter of 05/23/18  . EKG 12-Lead  . EKG 12-Lead  . ED EKG within 10 minutes  . ED EKG within 10 minutes    ASSESSMENT AND PLAN:   32 year old male presenting with abdominal pain during peritoneal dialysis.  # Abdominal pain.  Likely due to peritoneal catheter and fluid distention.  No peritonitis on fluid analysis.  Discussed with Dr. Zollie Scale of nephrology.  Patient will follow-up with his surgeon as outpatient who placed the catheter.  # Healthcare associated pneumonia with acute hypoxic respiratory failure Broad-spectrum IV antibiotics cefepime and Flagyl and bronchodilator treatments Wean oxygen as tolerated  # End-stage renal disease on peritoneal dialysis continue per nephrology recommendations  # Essential hypertension-continue home medication Cozaar, metoprolol and clonidine.  All the records are reviewed and case discussed with Care Management/Social Workerr. Management plans discussed with the patient, family and they are in  agreement.  CODE STATUS: FULL CODE  TOTAL TIME TAKING CARE OF THIS PATIENT: 35 minutes.   POSSIBLE D/C IN 2  DAYS, DEPENDING ON CLINICAL CONDITION.  Note: This dictation was prepared with Dragon dictation along with smaller phrase technology. Any transcriptional errors that result from this process are unintentional.   Neita Carp M.D on 05/31/2018 at 2:55 PM  Between 7am to 6pm - Pager - [phone]  After 6pm go to [email or web address] - password [email or web address] [facility]  Office  [phone]  CC: Primary care physician; Patient, No Pcp Per

## 2018-06-01 LAB — CBC WITH DIFFERENTIAL/PLATELET
Abs Immature Granulocytes: 0.1 10*3/uL — ABNORMAL HIGH (ref 0.00–0.07)
BASOS ABS: 0 10*3/uL (ref 0.0–0.1)
Basophils Relative: 0 %
EOS PCT: 0 %
Eosinophils Absolute: 0 10*3/uL (ref 0.0–0.5)
HCT: 23.4 % — ABNORMAL LOW (ref 39.0–52.0)
Hemoglobin: 7.3 g/dL — ABNORMAL LOW (ref 13.0–17.0)
Immature Granulocytes: 1 %
LYMPHS PCT: 5 %
Lymphs Abs: 0.6 10*3/uL — ABNORMAL LOW (ref 0.7–4.0)
MCH: 30.3 pg (ref 26.0–34.0)
MCHC: 31.2 g/dL (ref 30.0–36.0)
MCV: 97.1 fL (ref 80.0–100.0)
Monocytes Absolute: 0.6 10*3/uL (ref 0.1–1.0)
Monocytes Relative: 5 %
Neutro Abs: 9.2 10*3/uL — ABNORMAL HIGH (ref 1.7–7.7)
Neutrophils Relative %: 89 %
Platelets: 168 10*3/uL (ref 150–400)
RBC: 2.41 MIL/uL — AB (ref 4.22–5.81)
RDW: 16.4 % — ABNORMAL HIGH (ref 11.5–15.5)
WBC: 10.4 10*3/uL (ref 4.0–10.5)
nRBC: 0 % (ref 0.0–0.2)

## 2018-06-01 MED ORDER — OXYCODONE HCL 5 MG PO TABS: 5.0000 mg | ORAL_TABLET | Freq: Four times a day (QID) | ORAL | 0 refills | Status: DC | PRN

## 2018-06-01 MED ORDER — AMOXICILLIN-POT CLAVULANATE 875-125 MG PO TABS: 1.0000 | ORAL_TABLET | Freq: Every day | ORAL | 0 refills | Status: AC

## 2018-06-01 NOTE — Discharge Instructions (Signed)
Resume diet and activity as before ° ° °

## 2018-06-01 NOTE — Progress Notes (Signed)
[facility], [place] 06/01/18  Subjective:  Patient overall feeling better. Continues to have mild tenderness overlying the catheter in his lower right abdomen. PD fluid culture remains negative.    Objective:  Vital signs in last 24 hours:  Temp:  [97.7 F (36.5 C)-98 F (36.7 C)] 98 F (36.7 C) (12/29 0819) Pulse Rate:  [62-76] 62 (12/29 0451) Resp:  [16-18] 16 (12/29 0451) BP: (113-125)/(65-80) 113/65 (12/29 0451) SpO2:  [95 %-97 %] 96 % (12/29 0451) Weight:  [76 kg-76.6 kg] 76 kg (12/29 0819)  Weight change: -3.014 kg Filed Weights   05/31/18 2015 06/01/18 0451 06/01/18 0819  Weight: 76.6 kg 76.6 kg 76 kg    Intake/Output:    Intake/Output Summary (Last 24 hours) at 06/01/2018 1312 Last data filed at 06/01/2018 0819 Gross per 24 hour  Intake 223.7 ml  Output 1400 ml  Net -1176.3 ml     Physical Exam: General:  Well-appearing, laying in the bed, no acute distress  HEENT  moist oral mucous membranes  Neck  supple  Pulm/lungs  normal breathing effort, clear to auscultation bilaterally  CVS/Heart  regular rhythm  Abdomen:   Right lower quadrant superficial tenderness overlying track of PD catheter  Extremities:  No peripheral edema  Neurologic:  Alert, oriented  Skin:  No acute rashes  Access:  PD catheter       Basic Metabolic Panel:  Recent Labs  Lab 05/29/18 1628 05/30/18 0358 05/31/18 0501  NA 134* 133*  --   K 4.3 4.2  --   CL 94* 94*  --   CO2 23 22  --   GLUCOSE 92 118*  --   BUN 88* 94*  --   CREATININE 13.95* 14.75*  --   CALCIUM 7.5* 7.0*  --   MG 1.2*  --  1.7  PHOS 6.5*  --   --      CBC: Recent Labs  Lab 05/29/18 1628 05/30/18 0358 05/31/18 0501 06/01/18 0342  WBC 16.6* 18.0* 16.7* 10.4  NEUTROABS  --   --  14.5* 9.2*  HGB 8.0* 7.5* 7.1* 7.3*  HCT 25.9* 25.0* 22.5* 23.4*  MCV 100.4* 100.8* 95.7 97.1  PLT 163 171 159 168      Lab Results  Component Value Date   HEPBSAG Negative  10/10/2017   HEPBSAG Negative 10/10/2017   HEPBSAB Reactive 10/10/2017   HEPBIGM Negative 10/10/2017      Microbiology:  Recent Results (from the past 240 hour(s))  MRSA PCR Screening     Status: None   Collection Time: 05/24/18  2:29 AM  Result Value Ref Range Status   MRSA by PCR NEGATIVE NEGATIVE Final    Comment:        The GeneXpert MRSA Assay (FDA approved for NASAL specimens only), is one component of a comprehensive MRSA colonization surveillance program. It is not intended to diagnose MRSA infection nor to guide or monitor treatment for MRSA infections. Performed at [facility], [address] [place], [place] [address]   Body fluid culture     Status: None   Collection Time: 05/24/18  9:35 AM  Result Value Ref Range Status   Specimen Description   Final    PERITONEAL Performed at [facility], [address] [place], [place] [address]    Special Requests   Final    Normal Performed at [facility], [address] [place], [place] [address]    Gram Stain   Final  NO WBC SEEN NO ORGANISMS SEEN Performed at [facility], [place] [address], [place], [place] [address]    Culture NO GROWTH  Final   Report Status 05/27/2018 FINAL  Final  Body fluid culture     Status: None (Preliminary result)   Collection Time: 05/30/18  2:00 AM  Result Value Ref Range Status   Specimen Description   Final    PERITONEAL DIALYSATE Performed at [facility], [address] [place], [place] [address]    Special Requests   Final    Immunocompromised Performed at [facility], [place], [place] [address]    Gram Stain NO WBC SEEN NO ORGANISMS SEEN   Final   Culture   Final    NO GROWTH 2 DAYS Performed at [facility], [place] [address] [place], [place] [address]    Report Status PENDING  Incomplete  Gram stain     Status: None   Collection Time: 05/30/18  2:00 AM  Result Value Ref Range  Status   Specimen Description PERITONEAL DIALYSATE  Final   Special Requests Immunocompromised  Final   Gram Stain   Final    RARE WBC SEEN NO ORGANISMS SEEN Performed at [facility], [address] [place], [place] [address]    Report Status 05/30/2018 FINAL  Final    Coagulation Studies: No results for input(s): LABPROT, INR in the last 72 hours.  Urinalysis: No results for input(s): COLORURINE, LABSPEC, PHURINE, GLUCOSEU, HGBUR, BILIRUBINUR, KETONESUR, PROTEINUR, UROBILINOGEN, NITRITE, LEUKOCYTESUR in the last 72 hours.  Invalid input(s): APPERANCEUR    Imaging: No results found.   Medications:   . sodium chloride Stopped (05/31/18 2346)  . ceFEPime (MAXIPIME) IV Stopped (05/31/18 2327)   . ALPRAZolam  0.25 mg Oral BID  . amLODipine  10 mg Oral Daily  . calcium acetate  1,334 mg Oral TID WC  . calcium carbonate  1 tablet Oral BID  . cloNIDine  0.2 mg Oral BID  . feeding supplement (NEPRO CARB STEADY)  237 mL Oral TID WC  . furosemide  80 mg Oral Daily  . gentamicin cream  1 application Topical Daily  . heparin  5,000 Units Subcutaneous Q8H  . isosorbide mononitrate  30 mg Oral Daily  . losartan  100 mg Oral Daily  . metoprolol tartrate  50 mg Oral BID  . metroNIDAZOLE  500 mg Oral Q8H  . multivitamin  1 tablet Oral QHS  . pantoprazole  20 mg Oral Daily  . polyethylene glycol  17 g Oral BID  . predniSONE  20 mg Oral Q breakfast  . rOPINIRole  0.5 mg Oral QHS  . vitamin C  500 mg Oral BID  . Vitamin D (Ergocalciferol)  50,000 Units Oral Q7 days   sodium chloride, acetaminophen **OR** acetaminophen, guaiFENesin-dextromethorphan, heparin, HYDROmorphone (DILAUDID) injection, ondansetron **OR** ondansetron (ZOFRAN) IV, oxyCODONE-acetaminophen  Assessment/ Plan:  32 y.o. caucasian male with end stage renal disease on hemodialysis, hypertension, crescentic glomerulonephritis, who was admitted to [facility] for abdominal pain  CCPD/ Dr. Holley Raring 9 hrs/ 2 x 2500  cc/ last fill icodextrin 2000 cc  1.  End-stage renal disease 2.  Anemia chronic kidney disease 3.  Secondary hyperparathyroidism 4.  Abdominal pain overlying tunneled portion of PD catheter 5.  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  Hypertension, severe with CKD  Plan: As before patient doing well.  He continues to have point tenderness overlying the superficial portion of the PD catheter in  the right lower quadrant.  I have discussed the case with the surgeon who placed his PD catheter.  This location of the catheter can potentially be revised.  He will be referred on an outpatient basis to Dr. Raul Del for this purpose.  Otherwise continue PD as prescribed.  He will need to be maintained on icodextrin as a daytime fill to maintain adequacy targets.  Otherwise he is cleared for discharge at this time.    LOS: 3 Audria Takeshita 12/29/20191:12 PM  [place], [place]  Note: This note was prepared with Dragon dictation. Any transcription errors are unintentional

## 2018-06-01 NOTE — Progress Notes (Signed)
Entered patient room at 3:15AM due to alarming SpO2. Patient was holding oxygen tubing in hand and said "sorry I took my oxygen off again". Patient re-educated on the use of supplemental oxygen during stay.

## 2018-06-01 NOTE — Progress Notes (Signed)
SATURATION QUALIFICATIONS: (This note is used to comply with regulatory documentation for home oxygen)  Patient Saturations on Room Air at Rest = 95%  Patient Saturations on Room Air while Ambulating = 94%  Patient Saturations on 0 Liters of oxygen while Ambulating = 94%  Please briefly explain why patient needs home oxygen: 

## 2018-06-02 LAB — BODY FLUID CULTURE
Culture: NO GROWTH
GRAM STAIN: NONE SEEN

## 2018-06-09 ENCOUNTER — Encounter: Admit: 2018-06-09 | Discharge: 2018-06-10 | Payer: PRIVATE HEALTH INSURANCE

## 2018-06-09 DIAGNOSIS — Z01818 Encounter for other preprocedural examination: Principal | ICD-10-CM

## 2018-06-10 NOTE — Discharge Summary (Signed)
[name] NAME: Per [name]    MR#:  [id number]  DATE OF BIRTH:  [birth year]  DATE OF ADMISSION:  05/29/2018 ADMITTING PHYSICIAN: Lance Coon, MD  DATE OF DISCHARGE: 06/01/2018  4:18 PM  PRIMARY CARE PHYSICIAN: Patient, No Pcp Per   ADMISSION DIAGNOSIS:  Generalized abdominal pain [R10.84] Peritoneal dialysis catheter tunnel infection, subsequent encounter ([place]) [T85.71XD] Fever, unspecified fever cause [R50.9]  DISCHARGE DIAGNOSIS:  Principal Problem:   Peritonitis ([place]) Active Problems:   GERD (gastroesophageal reflux disease)   End-stage renal disease on peritoneal dialysis ([place])   Essential hypertension   HCAP (healthcare-associated pneumonia)   SECONDARY DIAGNOSIS:   Past Medical History:  Diagnosis Date  . Constipation   . Diarrhea   . Glomerulonephritis   . Hemorrhoids   . Kidney infection   . Nausea and vomiting   . Occult blood in stools   . Shortness of breath   . Sleep apnea      ADMITTING HISTORY  HISTORY OF PRESENT ILLNESS:  Jeffrey Holloway  is a 33 y.o. male who presents with chief complaint as above.  Patient presents to the ED with recurrent abdominal pain.  He was recently hospitalized here with questionable peritonitis.  He does peritoneal dialysis, and states that he recently had his dialysate type changed.  After this he feels like he developed his symptoms with abdominal pain.  When he was here last time he had them drained his dialysate and replace it with what he had been using previously, and states that his symptoms improved significantly at that time.  However, he did have leukocytosis and was treated with antibiotics during the hospital stay as well.  Nephrology was contacted by ED physician here tonight, who recommended repeating patient CT scan, which showed no significant abdominal abnormality or change from prior scanning, but did show left lower lobe pneumonia.  Plan is for dialysis  nurse to remove dialysate for testing and also symptom relief.  Hospitalist were called for admission   HOSPITAL COURSE:   33 year old male presenting with abdominal pain during peritoneal dialysis.  # Abdominal pain.  Likely due to peritoneal catheter and fluid distention.  No peritonitis on fluid analysis.  Discussed with Dr. Zollie Scale of nephrology.  Patient will follow-up with his surgeon as outpatient who placed the catheter.  # Healthcare associated pneumonia with acute hypoxic respiratory failure Broad-spectrum IV antibiotics cefepime and Flagyl and bronchodilator treatments Wean oxygen as tolerated Patient initially needed 2 L oxygen.  By the day of discharge his oxygen has been weaned off.  He ambulated with saturations of 94%. Switch to oral Augmentin.  Discharged home.  # End-stage renal disease on peritoneal dialysis continue per nephrology recommendations  # Essential hypertension-continue home medication Cozaar, metoprolol and clonidine.  Follow-up with primary care physician, nephrology.   CONSULTS OBTAINED:    DRUG ALLERGIES:  No Known Allergies  DISCHARGE MEDICATIONS:   Allergies as of 06/01/2018   No Known Allergies     Medication List    STOP taking these medications   oxyCODONE-acetaminophen 5-325 MG tablet Commonly known as:  PERCOCET   sulfamethoxazole-trimethoprim 400-80 MG tablet Commonly known as:  BACTRIM     TAKE these medications   ALPRAZolam 0.25 MG tablet Commonly known as:  XANAX Take 1 tablet by mouth 2 (two) times daily.   amLODipine 10 MG tablet Commonly known as:  NORVASC Take 1 tablet (10 mg total) by mouth daily.   calcium acetate 667  MG capsule Commonly known as:  PHOSLO Take 2 capsules (1,334 mg total) by mouth 3 (three) times daily with meals.   calcium carbonate 500 MG chewable tablet Commonly known as:  TUMS - dosed in mg elemental calcium Chew 1 tablet by mouth 2 (two) times daily.   cloNIDine 0.2 MG  tablet Commonly known as:  CATAPRES Take 1 tablet (0.2 mg total) by mouth 2 (two) times daily.   feeding supplement (NEPRO CARB STEADY) Liqd Take 237 mLs by mouth 2 (two) times daily between meals.   furosemide 80 MG tablet Commonly known as:  LASIX Take 1 tablet by mouth daily.   isosorbide mononitrate 30 MG 24 hr tablet Commonly known as:  IMDUR Take 1 tablet (30 mg total) by mouth daily.   losartan 100 MG tablet Commonly known as:  COZAAR Take 1 tablet (100 mg total) by mouth daily.   metoprolol tartrate 50 MG tablet Commonly known as:  LOPRESSOR Take 1 tablet (50 mg total) by mouth 2 (two) times daily.   MINOXIDIL PO Take by mouth.   multivitamin Tabs tablet Take 1 tablet by mouth at bedtime.   omeprazole 20 MG capsule Commonly known as:  PRILOSEC Take 20 mg by mouth daily.   oxyCODONE 5 MG immediate release tablet Commonly known as:  ROXICODONE Take 1 tablet (5 mg total) by mouth every 6 (six) hours as needed for severe pain.   predniSONE 20 MG tablet Commonly known as:  DELTASONE Take 1.5 tablets (30 mg total) by mouth daily with breakfast. What changed:  how much to take   rOPINIRole 0.5 MG tablet Commonly known as:  REQUIP Take 1 tablet by mouth at bedtime.     ASK your doctor about these medications   amoxicillin-clavulanate 875-125 MG tablet Commonly known as:  AUGMENTIN Take 1 tablet by mouth daily for 5 days. Ask about: Should I take this medication?       Today   VITAL SIGNS:  Blood pressure 113/65, pulse 62, temperature 98 F (36.7 C), resp. rate 16, height 6' (1.829 m), weight 76 kg, SpO2 96 %.  I/O:  No intake or output data in the 24 hours ending 06/10/18 1706  PHYSICAL EXAMINATION:  Physical Exam  GENERAL:  33 y.o.-year-old patient lying in the bed with no acute distress.  LUNGS: Normal breath sounds bilaterally, no wheezing, rales,rhonchi or crepitation. No use of accessory muscles of respiration.  CARDIOVASCULAR: S1, S2 normal.  No murmurs, rubs, or gallops.  ABDOMEN: Soft, tender around dialysis catheter site, non-distended. Bowel sounds present. No organomegaly or mass.  Peritoneal dialysis catheter in place NEUROLOGIC: Moves all 4 extremities. PSYCHIATRIC: The patient is alert and oriented x 3.  SKIN: No obvious rash, lesion, or ulcer.   DATA REVIEW:   CBC No results for input(s): WBC, HGB, HCT, PLT in the last 168 hours.  Chemistries  No results for input(s): NA, K, CL, CO2, GLUCOSE, BUN, CREATININE, CALCIUM, MG, AST, ALT, ALKPHOS, BILITOT in the last 168 hours.  Invalid input(s): GFRCGP  Cardiac Enzymes No results for input(s): TROPONINI in the last 168 hours.  Microbiology Results  Results for orders placed or performed during the hospital encounter of 05/29/18  Body fluid culture     Status: None   Collection Time: 05/30/18  2:00 AM  Result Value Ref Range Status   Specimen Description   Final    PERITONEAL DIALYSATE Performed at [facility], [address], [place], [place] [address]    Special Requests  Final    Immunocompromised Performed at [facility], [place], [place] [address]    Gram Stain NO WBC SEEN NO ORGANISMS SEEN   Final   Culture   Final    NO GROWTH 3 DAYS Performed at [facility], [place] [address] [place], [place] [address]    Report Status 06/02/2018 FINAL  Final  Gram stain     Status: None   Collection Time: 05/30/18  2:00 AM  Result Value Ref Range Status   Specimen Description PERITONEAL DIALYSATE  Final   Special Requests Immunocompromised  Final   Gram Stain   Final    RARE WBC SEEN NO ORGANISMS SEEN Performed at [facility], [address], [place], [place] [address]    Report Status 05/30/2018 FINAL  Final    RADIOLOGY:  No results found.  Follow up with PCP in 1 week.  Management plans discussed with the patient, family and they are in agreement.  CODE STATUS:  Code Status History    Date  Active Date Inactive Code Status Order ID Comments User Context   05/30/2018 0036 06/01/2018 1923 Full Code [id number]  Lance Coon, MD Inpatient   05/24/2018 0217 05/25/2018 2033 Full Code [id number]  Arta Silence, MD Inpatient   03/31/2018 0805 04/04/2018 1644 Full Code [id number]  Arta Silence, MD Inpatient   11/25/2017 2159 11/27/2017 2231 Full Code [id number]  Nicholes Mango, MD Inpatient   10/10/2017 1338 10/18/2017 2059 Full Code [id number]  Arta Silence, MD ED      TOTAL TIME TAKING CARE OF THIS PATIENT ON DAY OF DISCHARGE: more than 30 minutes.   Jeffrey Holloway M.D on 06/10/2018 at 5:06 PM  Between 7am to 6pm - Pager - [phone]  After 6pm go to [email or web address] - password [email or web address] [facility]  Office  [phone]  CC: Primary care physician; Patient, No Pcp Per  Note: This dictation was prepared with Dragon dictation along with smaller phrase technology. Any transcriptional errors that result from this process are unintentional.

## 2018-06-25 ENCOUNTER — Inpatient Hospital Stay
Admission: EM | Admit: 2018-06-25 | Discharge: 2018-06-28 | DRG: 871 | Disposition: A | Discharge status: Home / Self Care | Payer: BLUE CROSS/BLUE SHIELD | Attending: Internal Medicine | Admitting: Internal Medicine

## 2018-06-25 ENCOUNTER — Emergency Department: Payer: BLUE CROSS/BLUE SHIELD

## 2018-06-25 ENCOUNTER — Encounter: Payer: Self-pay | Admitting: Emergency Medicine

## 2018-06-25 ENCOUNTER — Other Ambulatory Visit: Payer: Self-pay

## 2018-06-25 DIAGNOSIS — N186 End stage renal disease: Secondary | ICD-10-CM | POA: Diagnosis present

## 2018-06-25 DIAGNOSIS — A419 Sepsis, unspecified organism: Principal | ICD-10-CM

## 2018-06-25 DIAGNOSIS — R1084 Generalized abdominal pain: Secondary | ICD-10-CM

## 2018-06-25 DIAGNOSIS — R7989 Other specified abnormal findings of blood chemistry: Secondary | ICD-10-CM

## 2018-06-25 DIAGNOSIS — Y95 Nosocomial condition: Secondary | ICD-10-CM | POA: Diagnosis present

## 2018-06-25 DIAGNOSIS — R0902 Hypoxemia: Secondary | ICD-10-CM

## 2018-06-25 DIAGNOSIS — D631 Anemia in chronic kidney disease: Secondary | ICD-10-CM | POA: Diagnosis present

## 2018-06-25 DIAGNOSIS — Z992 Dependence on renal dialysis: Secondary | ICD-10-CM | POA: Diagnosis not present

## 2018-06-25 DIAGNOSIS — I120 Hypertensive chronic kidney disease with stage 5 chronic kidney disease or end stage renal disease: Secondary | ICD-10-CM | POA: Diagnosis present

## 2018-06-25 DIAGNOSIS — Z888 Allergy status to other drugs, medicaments and biological substances status: Secondary | ICD-10-CM | POA: Diagnosis not present

## 2018-06-25 DIAGNOSIS — Z8701 Personal history of pneumonia (recurrent): Secondary | ICD-10-CM

## 2018-06-25 DIAGNOSIS — G4730 Sleep apnea, unspecified: Secondary | ICD-10-CM | POA: Diagnosis present

## 2018-06-25 DIAGNOSIS — Z79899 Other long term (current) drug therapy: Secondary | ICD-10-CM

## 2018-06-25 DIAGNOSIS — F17210 Nicotine dependence, cigarettes, uncomplicated: Secondary | ICD-10-CM | POA: Diagnosis present

## 2018-06-25 DIAGNOSIS — R778 Other specified abnormalities of plasma proteins: Secondary | ICD-10-CM

## 2018-06-25 DIAGNOSIS — Z79891 Long term (current) use of opiate analgesic: Secondary | ICD-10-CM

## 2018-06-25 DIAGNOSIS — Z7952 Long term (current) use of systemic steroids: Secondary | ICD-10-CM

## 2018-06-25 DIAGNOSIS — I959 Hypotension, unspecified: Secondary | ICD-10-CM | POA: Diagnosis present

## 2018-06-25 DIAGNOSIS — J189 Pneumonia, unspecified organism: Secondary | ICD-10-CM | POA: Diagnosis present

## 2018-06-25 DIAGNOSIS — J9601 Acute respiratory failure with hypoxia: Secondary | ICD-10-CM | POA: Diagnosis present

## 2018-06-25 DIAGNOSIS — R6520 Severe sepsis without septic shock: Secondary | ICD-10-CM | POA: Diagnosis not present

## 2018-06-25 DIAGNOSIS — K652 Spontaneous bacterial peritonitis: Secondary | ICD-10-CM

## 2018-06-25 LAB — BODY FLUID CELL COUNT WITH DIFFERENTIAL
Eos, Fluid: 0 %
Lymphs, Fluid: 27 %
Monocyte-Macrophage-Serous Fluid: 45 %
Neutrophil Count, Fluid: 28 %
Other Cells, Fluid: 0 %
Total Nucleated Cell Count, Fluid: 93 cu mm

## 2018-06-25 LAB — COMPREHENSIVE METABOLIC PANEL
ALK PHOS: 54 U/L (ref 38–126)
ALT: <5 U/L (ref 0–44)
AST: 20 U/L (ref 15–41)
Albumin: 3.2 g/dL — ABNORMAL LOW (ref 3.5–5.0)
Anion gap: 15 (ref 5–15)
BUN: 65 mg/dL — ABNORMAL HIGH (ref 6–20)
CALCIUM: 8 mg/dL — AB (ref 8.9–10.3)
CO2: 27 mmol/L (ref 22–32)
Chloride: 97 mmol/L — ABNORMAL LOW (ref 98–111)
Creatinine, Ser: 15.27 mg/dL — ABNORMAL HIGH (ref 0.61–1.24)
GFR calc non Af Amer: 4 mL/min — ABNORMAL LOW (ref >60)
GFR, EST AFRICAN AMERICAN: 4 mL/min — AB (ref >60)
Glucose, Bld: 92 mg/dL (ref 70–99)
Potassium: 3.2 mmol/L — ABNORMAL LOW (ref 3.5–5.1)
Sodium: 139 mmol/L (ref 135–145)
Total Bilirubin: 0.6 mg/dL (ref 0.3–1.2)
Total Protein: 6.4 g/dL — ABNORMAL LOW (ref 6.5–8.1)

## 2018-06-25 LAB — CBC WITH DIFFERENTIAL/PLATELET
Abs Immature Granulocytes: 0.86 10*3/uL — ABNORMAL HIGH (ref 0.00–0.07)
Basophils Absolute: 0.1 10*3/uL (ref 0.0–0.1)
Basophils Relative: 0 %
Eosinophils Absolute: 0 10*3/uL (ref 0.0–0.5)
Eosinophils Relative: 0 %
HEMATOCRIT: 24.9 % — AB (ref 39.0–52.0)
Hemoglobin: 7.7 g/dL — ABNORMAL LOW (ref 13.0–17.0)
Immature Granulocytes: 4 %
Lymphocytes Relative: 9 %
Lymphs Abs: 1.7 10*3/uL (ref 0.7–4.0)
MCH: 30.9 pg (ref 26.0–34.0)
MCHC: 30.9 g/dL (ref 30.0–36.0)
MCV: 100 fL (ref 80.0–100.0)
Monocytes Absolute: 1.3 10*3/uL — ABNORMAL HIGH (ref 0.1–1.0)
Monocytes Relative: 7 %
Neutro Abs: 15.7 10*3/uL — ABNORMAL HIGH (ref 1.7–7.7)
Neutrophils Relative %: 80 %
Platelets: 274 10*3/uL (ref 150–400)
RBC: 2.49 MIL/uL — ABNORMAL LOW (ref 4.22–5.81)
RDW: 17.4 % — AB (ref 11.5–15.5)
WBC: 19.6 10*3/uL — ABNORMAL HIGH (ref 4.0–10.5)
nRBC: 0.2 % (ref 0.0–0.2)

## 2018-06-25 LAB — PROTIME-INR
INR: 1.17
Prothrombin Time: 14.8 seconds (ref 11.4–15.2)

## 2018-06-25 LAB — PROCALCITONIN: Procalcitonin: 1.65 ng/mL

## 2018-06-25 LAB — PATHOLOGIST SMEAR REVIEW

## 2018-06-25 LAB — LACTIC ACID, PLASMA: Lactic Acid, Venous: 0.8 mmol/L (ref 0.5–1.9)

## 2018-06-25 LAB — INFLUENZA PANEL BY PCR (TYPE A & B)
Influenza A By PCR: NEGATIVE
Influenza B By PCR: NEGATIVE

## 2018-06-25 LAB — GLUCOSE, CAPILLARY: Glucose-Capillary: 85 mg/dL (ref 70–99)

## 2018-06-25 LAB — TROPONIN I: Troponin I: 0.03 ng/mL (ref <0.03)

## 2018-06-25 LAB — BRAIN NATRIURETIC PEPTIDE: B Natriuretic Peptide: 147 pg/mL — ABNORMAL HIGH (ref 0.0–100.0)

## 2018-06-25 LAB — MRSA PCR SCREENING: MRSA BY PCR: NEGATIVE

## 2018-06-25 LAB — LIPASE, BLOOD: Lipase: 68 U/L — ABNORMAL HIGH (ref 11–51)

## 2018-06-25 MED ORDER — PREDNISONE 10 MG PO TABS
30.0000 mg | ORAL_TABLET | Freq: Every day | ORAL | Status: DC
  Filled 2018-06-25: qty 3

## 2018-06-25 MED ORDER — LEVOFLOXACIN IN D5W 750 MG/150ML IV SOLN
750.0000 mg | Freq: Once | INTRAVENOUS | Status: AC
  Administered 2018-06-25: 750 mg via INTRAVENOUS
  Filled 2018-06-25: qty 150

## 2018-06-25 MED ORDER — SODIUM CHLORIDE 0.9 % IV SOLN
2.0000 g | INTRAVENOUS | Status: DC
  Administered 2018-06-25: 2 g via INTRAVENOUS
  Filled 2018-06-25: qty 20

## 2018-06-25 MED ORDER — ONDANSETRON HCL 4 MG PO TABS: 4.0000 mg | ORAL_TABLET | Freq: Four times a day (QID) | ORAL | Status: DC | PRN

## 2018-06-25 MED ORDER — ALPRAZOLAM 0.25 MG PO TABS
0.2500 mg | ORAL_TABLET | Freq: Two times a day (BID) | ORAL | Status: DC
  Administered 2018-06-25 – 2018-06-28 (×7): 0.25 mg via ORAL
  Filled 2018-06-25 (×7): qty 1

## 2018-06-25 MED ORDER — ACETAMINOPHEN 650 MG RE SUPP: 650.0000 mg | Freq: Four times a day (QID) | RECTAL | Status: DC | PRN

## 2018-06-25 MED ORDER — ACETAMINOPHEN 325 MG PO TABS: 650.0000 mg | ORAL_TABLET | Freq: Four times a day (QID) | ORAL | Status: DC | PRN

## 2018-06-25 MED ORDER — MIDODRINE HCL 5 MG PO TABS: 2.5000 mg | ORAL_TABLET | Freq: Three times a day (TID) | ORAL | Status: DC

## 2018-06-25 MED ORDER — VANCOMYCIN HCL IN DEXTROSE 750-5 MG/150ML-% IV SOLN: 750.0000 mg | INTRAVENOUS | Status: DC

## 2018-06-25 MED ORDER — ROPINIROLE HCL 1 MG PO TABS
0.5000 mg | ORAL_TABLET | Freq: Every day | ORAL | Status: DC
  Administered 2018-06-25 – 2018-06-27 (×3): 0.5 mg via ORAL
  Filled 2018-06-25 (×2): qty 1
  Filled 2018-06-25 (×2): qty 2

## 2018-06-25 MED ORDER — EPOETIN ALFA 40000 UNIT/ML IJ SOLN
30000.0000 [IU] | Freq: Once | INTRAMUSCULAR | Status: DC
  Filled 2018-06-25: qty 1

## 2018-06-25 MED ORDER — SODIUM CHLORIDE 0.9 % IV BOLUS (SEPSIS)
250.0000 mL | Freq: Once | INTRAVENOUS | Status: AC
  Administered 2018-06-25: 250 mL via INTRAVENOUS

## 2018-06-25 MED ORDER — GENTAMICIN SULFATE 0.1 % EX CREA
1.0000 "application " | TOPICAL_CREAM | Freq: Every day | CUTANEOUS | Status: DC
  Administered 2018-06-25 – 2018-06-28 (×5): 1 via TOPICAL
  Filled 2018-06-25: qty 15

## 2018-06-25 MED ORDER — ONDANSETRON HCL 4 MG/2ML IJ SOLN
4.0000 mg | Freq: Four times a day (QID) | INTRAMUSCULAR | Status: DC | PRN
  Administered 2018-06-25 – 2018-06-28 (×2): 4 mg via INTRAVENOUS
  Filled 2018-06-25 (×2): qty 2

## 2018-06-25 MED ORDER — VANCOMYCIN HCL IN DEXTROSE 750-5 MG/150ML-% IV SOLN
750.0000 mg | INTRAVENOUS | Status: DC
  Filled 2018-06-25: qty 150

## 2018-06-25 MED ORDER — SODIUM CHLORIDE 0.9 % IV BOLUS
1000.0000 mL | Freq: Once | INTRAVENOUS | Status: AC
  Administered 2018-06-25: 1000 mL via INTRAVENOUS

## 2018-06-25 MED ORDER — DOCUSATE SODIUM 100 MG PO CAPS
100.0000 mg | ORAL_CAPSULE | Freq: Two times a day (BID) | ORAL | Status: DC
  Administered 2018-06-25 – 2018-06-28 (×6): 100 mg via ORAL
  Filled 2018-06-25 (×7): qty 1

## 2018-06-25 MED ORDER — CALCIUM ACETATE (PHOS BINDER) 667 MG PO CAPS
1334.0000 mg | ORAL_CAPSULE | Freq: Three times a day (TID) | ORAL | Status: DC
  Administered 2018-06-25 – 2018-06-27 (×7): 1334 mg via ORAL
  Filled 2018-06-25 (×11): qty 2

## 2018-06-25 MED ORDER — SODIUM CHLORIDE 0.9 % IV BOLUS (SEPSIS)
1000.0000 mL | Freq: Once | INTRAVENOUS | Status: AC
  Administered 2018-06-25: 1000 mL via INTRAVENOUS

## 2018-06-25 MED ORDER — SENNOSIDES-DOCUSATE SODIUM 8.6-50 MG PO TABS: 1.0000 | ORAL_TABLET | Freq: Every evening | ORAL | Status: DC | PRN

## 2018-06-25 MED ORDER — MORPHINE SULFATE (PF) 2 MG/ML IV SOLN
2.0000 mg | Freq: Once | INTRAVENOUS | Status: AC
  Administered 2018-06-25: 2 mg via INTRAVENOUS
  Filled 2018-06-25: qty 1

## 2018-06-25 MED ORDER — HEPARIN SODIUM (PORCINE) 5000 UNIT/ML IJ SOLN
5000.0000 [IU] | Freq: Three times a day (TID) | INTRAMUSCULAR | Status: DC
  Administered 2018-06-25 – 2018-06-28 (×10): 5000 [IU] via SUBCUTANEOUS
  Filled 2018-06-25 (×10): qty 1

## 2018-06-25 MED ORDER — HYDROCORTISONE NA SUCCINATE PF 100 MG IJ SOLR
50.0000 mg | Freq: Four times a day (QID) | INTRAMUSCULAR | Status: DC
  Administered 2018-06-25 – 2018-06-26 (×5): 50 mg via INTRAVENOUS
  Filled 2018-06-25 (×4): qty 2

## 2018-06-25 MED ORDER — SODIUM CHLORIDE 0.9 % IV SOLN
0.0000 ug/min | INTRAVENOUS | Status: DC
  Filled 2018-06-25: qty 1

## 2018-06-25 MED ORDER — VANCOMYCIN HCL IN DEXTROSE 1-5 GM/200ML-% IV SOLN
1000.0000 mg | Freq: Once | INTRAVENOUS | Status: AC
  Administered 2018-06-25: 1000 mg via INTRAVENOUS
  Filled 2018-06-25: qty 200

## 2018-06-25 MED ORDER — PHENYLEPHRINE HCL-NACL 10-0.9 MG/250ML-% IV SOLN
0.0000 ug/min | INTRAVENOUS | Status: DC
  Filled 2018-06-25: qty 250

## 2018-06-25 MED ORDER — EPOETIN ALFA 10000 UNIT/ML IJ SOLN
4000.0000 [IU] | Freq: Once | INTRAMUSCULAR | Status: AC
  Administered 2018-06-25: 4000 [IU] via SUBCUTANEOUS
  Filled 2018-06-25: qty 1

## 2018-06-25 MED ORDER — EPOETIN ALFA 40000 UNIT/ML IJ SOLN
30000.0000 [IU] | Freq: Once | INTRAMUSCULAR | Status: AC
  Administered 2018-06-25: 30000 [IU] via SUBCUTANEOUS
  Filled 2018-06-25: qty 1

## 2018-06-25 MED ORDER — ONDANSETRON HCL 4 MG/2ML IJ SOLN
4.0000 mg | INTRAMUSCULAR | Status: AC
  Administered 2018-06-25: 4 mg via INTRAVENOUS
  Filled 2018-06-25: qty 2

## 2018-06-25 MED ORDER — ALBUTEROL SULFATE (2.5 MG/3ML) 0.083% IN NEBU: 2.5000 mg | INHALATION_SOLUTION | RESPIRATORY_TRACT | Status: DC | PRN

## 2018-06-25 MED ORDER — OXYCODONE HCL 5 MG PO TABS
5.0000 mg | ORAL_TABLET | Freq: Four times a day (QID) | ORAL | Status: DC | PRN
  Administered 2018-06-25 – 2018-06-27 (×6): 5 mg via ORAL
  Filled 2018-06-25 (×6): qty 1

## 2018-06-25 MED ORDER — CALCIUM CARBONATE ANTACID 500 MG PO CHEW
1.0000 | CHEWABLE_TABLET | Freq: Two times a day (BID) | ORAL | Status: DC
  Administered 2018-06-25 – 2018-06-28 (×7): 200 mg via ORAL
  Filled 2018-06-25 (×7): qty 1

## 2018-06-25 MED ORDER — SODIUM CHLORIDE 0.9 % IV BOLUS
500.0000 mL | Freq: Once | INTRAVENOUS | Status: AC
  Administered 2018-06-25: 500 mL via INTRAVENOUS

## 2018-06-25 MED ORDER — SODIUM CHLORIDE 0.9 % IV SOLN
100.0000 mg | Freq: Once | INTRAVENOUS | Status: AC
  Administered 2018-06-25: 100 mg via INTRAVENOUS
  Filled 2018-06-25: qty 5

## 2018-06-25 MED ORDER — PANTOPRAZOLE SODIUM 40 MG PO TBEC
40.0000 mg | DELAYED_RELEASE_TABLET | Freq: Every day | ORAL | Status: DC
  Administered 2018-06-25 – 2018-06-28 (×4): 40 mg via ORAL
  Filled 2018-06-25 (×4): qty 1

## 2018-06-25 MED ORDER — OXYCODONE HCL 5 MG PO TABS
5.0000 mg | ORAL_TABLET | Freq: Four times a day (QID) | ORAL | Status: DC | PRN
  Administered 2018-06-25: 5 mg via ORAL
  Filled 2018-06-25 (×2): qty 1

## 2018-06-25 MED ORDER — POLYETHYLENE GLYCOL 3350 17 G PO PACK
17.0000 g | PACK | Freq: Two times a day (BID) | ORAL | Status: AC
  Administered 2018-06-25 – 2018-06-26 (×2): 17 g via ORAL
  Filled 2018-06-25 (×3): qty 1

## 2018-06-25 MED ORDER — SODIUM CHLORIDE 0.9 % IV SOLN
2.0000 g | INTRAVENOUS | Status: DC
  Administered 2018-06-26 – 2018-06-28 (×3): 2 g via INTRAVENOUS
  Filled 2018-06-25: qty 20
  Filled 2018-06-25 (×2): qty 2
  Filled 2018-06-25: qty 20

## 2018-06-25 MED ORDER — MORPHINE SULFATE (PF) 2 MG/ML IV SOLN
2.0000 mg | INTRAVENOUS | Status: DC | PRN
  Administered 2018-06-25 – 2018-06-28 (×17): 2 mg via INTRAVENOUS
  Filled 2018-06-25 (×17): qty 1

## 2018-06-25 MED ORDER — DELFLEX-LC/1.5% DEXTROSE 344 MOSM/L IP SOLN
INTRAPERITONEAL | Status: DC
  Administered 2018-06-26 – 2018-06-27 (×2): via INTRAPERITONEAL
  Filled 2018-06-25 (×4): qty 3000

## 2018-06-25 NOTE — ED Notes (Signed)
2nd set of cultures drawn at 0510, just prior to antibiotic started at 0516.

## 2018-06-25 NOTE — ED Notes (Signed)
Patient's BP lower at this time (75/47). Patient when woken up is able to state that his BP has been running in this range lately on typical basis. Patient remains on O2 via [place] at 2L. Dr. Karma Greaser aware of vitals.

## 2018-06-25 NOTE — Progress Notes (Addendum)
Pharmacy Antibiotic Note  Jeffrey Holloway is a 33 y.o. male admitted on 06/25/2018 with HCAP.  Pharmacy has been consulted for vancomycin dosing.  Plan: PD patient  1 gram loading dose ordered followed by 750 mg daily. Level ordered before 3rd maintenance dose.   Height: 6' (182.9 cm) Weight: 155 lb (70.3 kg) IBW/kg (Calculated) : 77.6  Temp (24hrs), Avg:100.4 F (38 C), Min:99.8 F (37.7 C), Max:100.9 F (38.3 C)  Recent Labs  Lab 06/25/18 0322  WBC 19.6*  CREATININE 15.27*  LATICACIDVEN 0.8    Estimated Creatinine Clearance: 6.8 mL/min (A) (by C-G formula based on SCr of 15.27 mg/dL (H)).    Allergies  Allergen Reactions  . Acetaminophen Nausea And Vomiting    Antimicrobials this admission: Vancomycin, ceftriaxone 1/22  >>    >>   Dose adjustments this admission:   Microbiology results: 1/22 BCx: pending 1/22 UCx: pending  1/22 PD fluid: pending    Thank you for allowing pharmacy to be a part of this patient's care.  Nysir Fergusson S 06/25/2018 6:34 AM

## 2018-06-25 NOTE — ED Notes (Signed)
ED TO INPATIENT HANDOFF REPORT  ED Nurse Name and Phone #: Mickel Baas [phone]  Name/Age/Gender Jeffrey Holloway 33 y.o. male Room/Bed: ED25A/ED25A  Code Status   Code Status: Full Code  Home/SNF/Other home Patient oriented x4 Is this baseline? yes  Triage Complete: Triage complete  Chief Complaint abd pain  Triage Note Patient ambulatory to triage with steady gait, without difficulty or distress noted; pt reports peritoneal dialysis site and abd is painful (surg last Tuesday to tunnel it deeper) since awakening at 1230am   Allergies Allergies  Allergen Reactions  . Acetaminophen Nausea And Vomiting    Level of Care/Admitting Diagnosis ED Disposition    ED Disposition Condition [facility]: [place] [[id number]]  Level of Care: Stepdown [14]  Diagnosis: SBP (spontaneous bacterial peritonitis) [facility]) [[id number]]  Admitting Physician: Hillary Bow [[id number]]  Attending Physician: Hillary Bow [phone]  Estimated length of stay: past midnight tomorrow  Certification:: I certify this patient will need inpatient services for at least 2 midnights  PT Class (Do Not Modify): Inpatient [101]  PT Acc Code (Do Not Modify): Private [1]       Medical/Surgery History Past Medical History:  Diagnosis Date  . Constipation   . Diarrhea   . Glomerulonephritis   . Hemorrhoids   . Kidney infection   . Nausea and vomiting   . Occult blood in stools   . Shortness of breath   . Sleep apnea    Past Surgical History:  Procedure Laterality Date  . APPENDECTOMY    . DIALYSIS/PERMA CATHETER INSERTION N/A 10/15/2017   Procedure: DIALYSIS/PERMA CATHETER INSERTION;  Surgeon: Katha Cabal, MD;  Location: [place] [facility];  Service: Cardiovascular;  Laterality: N/A;  . DIALYSIS/PERMA CATHETER REMOVAL N/A 05/12/2018   Procedure: DIALYSIS/PERMA CATHETER REMOVAL;  Surgeon: Algernon Huxley, MD;  Location: [place] [facility];  Service: Cardiovascular;   Laterality: N/A;     IV Location/Drains/Wounds Patient Lines/Drains/Airways Status   Active Line/Drains/Airways    Name:   Placement date:   Placement time:   Site:   Days:   Peripheral IV 06/25/18 Right Arm   06/25/18    0323    Arm   less than 1   Peripheral IV 06/25/18 Left Forearm   06/25/18    0510    Forearm   less than 1          Intake/Output Last 24 hours  Intake/Output Summary (Last 24 hours) at 06/25/2018 0725 Last data filed at 06/25/2018 6812 Gross per 24 hour  Intake 2350 ml  Output -  Net 2350 ml    Labs/Imaging Results for orders placed or performed during the hospital encounter of 06/25/18 (from the past 48 hour(s))  Lactic acid, plasma     Status: None   Collection Time: 06/25/18  3:22 AM  Result Value Ref Range   Lactic Acid, Venous 0.8 0.5 - 1.9 mmol/L    Comment: Performed at [facility], [address] [place], [place] [address]  Comprehensive metabolic panel     Status: Abnormal   Collection Time: 06/25/18  3:22 AM  Result Value Ref Range   Sodium 139 135 - 145 mmol/L   Potassium 3.2 (L) 3.5 - 5.1 mmol/L   Chloride 97 (L) 98 - 111 mmol/L   CO2 27 22 - 32 mmol/L   Glucose, Bld 92 70 - 99 mg/dL   BUN 65 (H) 6 - 20 mg/dL   Creatinine, Ser 15.27 (  H) 0.61 - 1.24 mg/dL   Calcium 8.0 (L) 8.9 - 10.3 mg/dL   Total Protein 6.4 (L) 6.5 - 8.1 g/dL   Albumin 3.2 (L) 3.5 - 5.0 g/dL   AST 20 15 - 41 U/L   ALT <5 0 - 44 U/L   Alkaline Phosphatase 54 38 - 126 U/L   Total Bilirubin 0.6 0.3 - 1.2 mg/dL   GFR calc non Af Amer 4 (L) >60 mL/min   GFR calc Af Amer 4 (L) >60 mL/min   Anion gap 15 5 - 15    Comment: Performed at [facility], [address] [place], [place] [address]  CBC with Differential     Status: Abnormal   Collection Time: 06/25/18  3:22 AM  Result Value Ref Range   WBC 19.6 (H) 4.0 - 10.5 K/uL    Comment: WHITE COUNT CONFIRMED ON SMEAR   RBC 2.49 (L) 4.22 - 5.81 MIL/uL   Hemoglobin 7.7 (L) 13.0 - 17.0 g/dL   HCT  24.9 (L) 39.0 - 52.0 %   MCV 100.0 80.0 - 100.0 fL   MCH 30.9 26.0 - 34.0 pg   MCHC 30.9 30.0 - 36.0 g/dL   RDW 17.4 (H) 11.5 - 15.5 %   Platelets 274 150 - 400 K/uL   nRBC 0.2 0.0 - 0.2 %   Neutrophils Relative % 80 %   Neutro Abs 15.7 (H) 1.7 - 7.7 K/uL   Lymphocytes Relative 9 %   Lymphs Abs 1.7 0.7 - 4.0 K/uL   Monocytes Relative 7 %   Monocytes Absolute 1.3 (H) 0.1 - 1.0 K/uL   Eosinophils Relative 0 %   Eosinophils Absolute 0.0 0.0 - 0.5 K/uL   Basophils Relative 0 %   Basophils Absolute 0.1 0.0 - 0.1 K/uL   Immature Granulocytes 4 %   Abs Immature Granulocytes 0.86 (H) 0.00 - 0.07 K/uL    Comment: Performed at [facility], [address] [place], [place] [address]  Lipase, blood     Status: Abnormal   Collection Time: 06/25/18  3:22 AM  Result Value Ref Range   Lipase 68 (H) 11 - 51 U/L    Comment: Performed at [facility], [address] [place], [place] [address]  Brain natriuretic peptide - IF patient is dyspneic     Status: Abnormal   Collection Time: 06/25/18  3:22 AM  Result Value Ref Range   B Natriuretic Peptide 147.0 (H) 0.0 - 100.0 pg/mL    Comment: Performed at [facility], [address] [place], [place] [address]  Troponin I - Add-On to previous collection     Status: Abnormal   Collection Time: 06/25/18  3:22 AM  Result Value Ref Range   Troponin I 0.03 (HH) <0.03 ng/mL    Comment: CRITICAL RESULT CALLED TO, READ BACK BY AND VERIFIED WITH  JENNIFER INGERSOL AT 8469 06/25/18 SDR Performed at [facility], [address] [place], [place] [address]   Procalcitonin     Status: None   Collection Time: 06/25/18  3:22 AM  Result Value Ref Range   Procalcitonin 1.65 ng/mL    Comment:        Interpretation: PCT > 0.5 ng/mL and <= 2 ng/mL: Systemic infection (sepsis) is possible, but other conditions are known to elevate PCT as well. (NOTE)       Sepsis PCT Algorithm           Lower Respiratory Tract  Infection PCT Algorithm    ----------------------------     ----------------------------         PCT < 0.25 ng/mL                PCT < 0.10 ng/mL         Strongly encourage             Strongly discourage   discontinuation of antibiotics    initiation of antibiotics    ----------------------------     -----------------------------       PCT 0.25 - 0.50 ng/mL            PCT 0.10 - 0.25 ng/mL               OR       >80% decrease in PCT            Discourage initiation of                                            antibiotics      Encourage discontinuation           of antibiotics    ----------------------------     -----------------------------         PCT >= 0.50 ng/mL              PCT 0.26 - 0.50 ng/mL                AND       <80% decrease in PCT             Encourage initiation of                                             antibiotics       Encourage continuation           of antibiotics    ----------------------------     -----------------------------        PCT >= 0.50 ng/mL                  PCT > 0.50 ng/mL               AND         increase in PCT                  Strongly encourage                                      initiation of antibiotics    Strongly encourage escalation           of antibiotics                                     -----------------------------                                           PCT <= 0.25 ng/mL  OR                                        > 80% decrease in PCT                                     Discontinue / Do not initiate                                             antibiotics Performed at [facility], [address] [place], [place] [address]   Protime-INR     Status: None   Collection Time: 06/25/18  3:22 AM  Result Value Ref Range   Prothrombin Time 14.8 11.4 - 15.2 seconds   INR 1.17     Comment: Performed at [facility], [address], [place], [place] [address]  Influenza panel by PCR (type A & B)     Status: None   Collection Time: 06/25/18  4:35 AM  Result Value Ref Range   Influenza A By PCR NEGATIVE NEGATIVE   Influenza B By PCR NEGATIVE NEGATIVE    Comment: (NOTE) The Xpert Xpress Flu assay is intended as an aid in the diagnosis of  influenza and should not be used as a sole basis for treatment.  This  assay is FDA approved for nasopharyngeal swab specimens only. Nasal  washings and aspirates are unacceptable for Xpert Xpress Flu testing. Performed at [facility], [address], [place], [place] [address]    Ct Abdomen Pelvis Wo Contrast  Result Date: 06/25/2018 CLINICAL DATA:  Abdominal pain beginning tonight. History of peritoneal dialysis, with abdominal surgery 1 week ago. History kidney infection and appendectomy. EXAM: CT ABDOMEN AND PELVIS WITHOUT CONTRAST TECHNIQUE: Multidetector CT imaging of the abdomen and pelvis was performed following the standard protocol without IV contrast. COMPARISON:  CT abdomen and pelvis May 29, 2018. FINDINGS: LOWER CHEST: Persistent consolidation LEFT lower lobe, resolution of pleural effusion. Included heart is mildly enlarged. Minimal RIGHT lung base atelectasis. HEPATOBILIARY: Normal. PANCREAS: Normal. SPLEEN: Normal. ADRENALS/URINARY TRACT: Kidneys are orthotopic, demonstrating normal size and morphology. No nephrolithiasis, hydronephrosis; limited assessment for renal masses by nonenhanced CT. The unopacified ureters are normal in course and caliber. Urinary bladder is partially distended and unremarkable. Normal adrenal glands. STOMACH/BOWEL: The stomach, small and large bowel are normal in course and caliber without inflammatory changes, sensitivity decreased by lack of enteric contrast. Moderate volume retained large bowel stool. Mild colonic diverticulosis. VASCULAR/LYMPHATIC: Aortoiliac vessels are normal in course and caliber. No lymphadenopathy by CT size  criteria. REPRODUCTIVE: Normal. OTHER: Intraperitoneal dialysis catheter distal tip in stomach. Small volume pneumoperitoneum and ascites. MUSCULOSKELETAL: Non-acute.  Small fat containing umbilical hernia. IMPRESSION: 1. Peritoneal dialysis and cited a small volume pneumoperitoneum and ascites. 2. Moderate volume retained large bowel stool without bowel obstruction. Mild colonic diverticulosis. 3. LEFT lower lobe atelectasis/scarring, less likely residual/recurrent pneumonia. Electronically Signed   By: Elon Alas M.D.   On: 06/25/2018 04:39   Dg Chest 2 View  Result Date: 06/25/2018 CLINICAL DATA:  Hypoxia EXAM: CHEST - 2 VIEW COMPARISON:  05/23/2018 FINDINGS: Patchy and streaky bilateral airspace opacity greatest on the right. Normal heart size. No  edema, effusion, or pneumothorax. Pneumoperitoneum correlating with peritoneal dialysis catheter. IMPRESSION: Patchy bilateral pneumonia. Electronically Signed   By: Monte Fantasia M.D.   On: 06/25/2018 04:37   Ct Chest Wo Contrast  Result Date: 06/25/2018 CLINICAL DATA:  Recent healthcare pneumonia. Sepsis. Evaluate for recurrent pneumonia EXAM: CT CHEST WITHOUT CONTRAST TECHNIQUE: Multidetector CT imaging of the chest was performed following the standard protocol without IV contrast. COMPARISON:  None. FINDINGS: Cardiovascular: Normal heart size.  No pericardial effusion Mediastinum/Nodes: Negative for adenopathy or mass. Lungs/Pleura: Multifocal airspace disease with reticulonodular appearance in the left lung and segmental consolidation in the right upper lobe. Mild superimposed atelectasis greatest in the left lower lobe. No cavitation or effusion. There is airway thickening and mild centrilobular emphysema. Upper Abdomen: Pneumoperitoneum. Patient is on peritoneal dialysis. There was preceding abdominal CT earlier today. Musculoskeletal: No acute or aggressive finding. IMPRESSION: 1. Multifocal pneumonia with segmental consolidation in the right  upper lobe. 2. Mild emphysema. Electronically Signed   By: Monte Fantasia M.D.   On: 06/25/2018 06:05    Pending Labs Unresulted Labs (From admission, onward)    Start     Ordered   06/28/18 0900  Vancomycin, trough  Once-Timed,   STAT     06/25/18 0638   06/26/18 7169  Basic metabolic panel  Tomorrow morning,   STAT     06/25/18 0614   06/26/18 0500  CBC  Tomorrow morning,   STAT     06/25/18 0614   06/25/18 0451  Pathologist smear review  ONCE - STAT,   STAT     06/25/18 0450   06/25/18 0451  Blood Culture (routine x 2)  BLOOD CULTURE X 2,   STAT     06/25/18 0452   06/25/18 0451  Urine culture  Add-on,   AD     06/25/18 0452   06/25/18 0446  Body fluid cell count with differential  ONCE - STAT,   STAT    Question:  Are there also cytology or pathology orders on this specimen?  Answer:  Yes   06/25/18 0450   06/25/18 0445  Body fluid culture  ONCE - STAT,   STAT    Question:  Patient immune status  Answer:  Normal   06/25/18 0450   06/25/18 0324  Urinalysis, Complete w Microscopic  ONCE - STAT,   STAT     06/25/18 0323          Vitals/Pain Today's Vitals   06/25/18 0530 06/25/18 0606 06/25/18 0608 06/25/18 0640  BP: (!) 90/45 (!) 75/47  (!) 89/51  Pulse: 90 88  92  Resp: 16 16  18   Temp:    98.4 F (36.9 C)  TempSrc:    Oral  SpO2: 96% 99%  99%  Weight:      Height:      PainSc:   Asleep 8     Isolation Precautions No active isolations  Medications Medications  cefTRIAXone (ROCEPHIN) 2 g in sodium chloride 0.9 % 100 mL IVPB (0 g Intravenous Stopped 06/25/18 0605)  heparin injection 5,000 Units (5,000 Units Subcutaneous Given 06/25/18 0715)  acetaminophen (TYLENOL) tablet 650 mg (has no administration in time range)    Or  acetaminophen (TYLENOL) suppository 650 mg (has no administration in time range)  docusate sodium (COLACE) capsule 100 mg (has no administration in time range)  senna-docusate (Senokot-S) tablet 1 tablet (has no administration in time range)   ondansetron (ZOFRAN) tablet 4 mg (has no administration in time  range)    Or  ondansetron (ZOFRAN) injection 4 mg (has no administration in time range)  albuterol (PROVENTIL) (2.5 MG/3ML) 0.083% nebulizer solution 2.5 mg (has no administration in time range)  morphine 2 MG/ML injection 2 mg (has no administration in time range)  polyethylene glycol (MIRALAX / GLYCOLAX) packet 17 g (has no administration in time range)  levofloxacin (LEVAQUIN) IVPB 750 mg (750 mg Intravenous New Bag/Given 06/25/18 0636)  vancomycin (VANCOCIN) IVPB 1000 mg/200 mL premix (1,000 mg Intravenous New Bag/Given 06/25/18 0712)  vancomycin (VANCOCIN) IVPB 750 mg/150 ml premix (has no administration in time range)  sodium chloride 0.9 % bolus 1,000 mL (0 mLs Intravenous Stopped 06/25/18 0640)    And  sodium chloride 0.9 % bolus 1,000 mL (0 mLs Intravenous Stopped 06/25/18 0639)    And  sodium chloride 0.9 % bolus 250 mL (0 mLs Intravenous Stopped 06/25/18 0524)  morphine 2 MG/ML injection 2 mg (2 mg Intravenous Given 06/25/18 0516)  ondansetron (ZOFRAN) injection 4 mg (4 mg Intravenous Given 06/25/18 0516)    Mobility  Low fall risk   Focused Assessments Patient has band in place across abdomen and dressing for comfort. Dressing appears to be clean and dry  Recommendations: See Admitting Provider Note  Report given to:   Additional Notes:  Dialysis RN to come to floor to obtain fluid from peritenial catheter for lab specimen

## 2018-06-25 NOTE — ED Notes (Signed)
Patient reports sharp pains to abd after having peritoneal dialysis catheter revision surgery one week ago (last Tuesday). Patient has low grade temp, states he began feeling bad yesterday afternoon. Patient's skin warm to touch.  Urine sample requested from patient. Patient states he does still make urine, just infrequently.

## 2018-06-25 NOTE — Consult Note (Addendum)
Name: Jeffrey Holloway MRN: [id number] DOB: 10/24/1985     CONSULTATION DATE: 06/25/2018  REFERRING MD :  Hillary Bow, MD  CHIEF COMPLAINT:  Abdominal Pain  SIGNIFICANT EVENTS/STUDIES:  1/22 - Arrived at ED, obtained Chest X-ray, CT Chest and Abd/pelvis w/o contrast 1/22 - Elevated WBC 19.6  1/22 - Transferred to ICU Stepdown for Hypotension    HISTORY OF PRESENT ILLNESS:  Patient is a 33 y.o. male with past medical history of ESRD on peritoneal dialysis, Hypertension, GERD, Glomerulonephritis, presents to the ED with abdominal pain. Pt reports had peritoneal dialysis catheter repositioned a week ago and has been doing fine without pain. He reports having unbearable pain at the catheter site at RUQ started yesterday, nothing relieved the pain, and he came to ED this morning. Lab shows WBC of 19.6, CT chest w/o contrast showed multifocal pneumonia with segmental consolidation in the right upper lobe. CT Abd/pelvis w/o contrast shows peritoneal dialysis, small pneumoperitoneum and ascites. Chest X-ray shows bilateral pneumonia. Pt transferred to ICU Stepdown for hypotension. Peritoneal fluid analysis pending. Pt is currently alert and awake, complains of pain around the catheter site. Last taken his home med last night.  PAST MEDICAL HISTORY :   has a past medical history of Constipation, Diarrhea, Glomerulonephritis, Hemorrhoids, Kidney infection, Nausea and vomiting, Occult blood in stools, Shortness of breath, and Sleep apnea.  has a past surgical history that includes Appendectomy; DIALYSIS/PERMA CATHETER INSERTION (N/A, 10/15/2017); and DIALYSIS/PERMA CATHETER REMOVAL (N/A, 05/12/2018). Prior to Admission medications   Medication Sig Start Date End Date Taking? Authorizing Provider  ALPRAZolam Duanne Moron) 0.25 MG tablet Take 1 tablet by mouth 2 (two) times daily. 05/02/18  Yes [provider]  amLODipine (NORVASC) 10 MG tablet Take 1 tablet (10 mg total) by mouth daily. 10/18/17  Yes  Epifanio Lesches, MD  calcium acetate (PHOSLO) 667 MG capsule Take 2 capsules (1,334 mg total) by mouth 3 (three) times daily with meals. 10/18/17  Yes Epifanio Lesches, MD  calcium carbonate (TUMS - DOSED IN MG ELEMENTAL CALCIUM) 500 MG chewable tablet Chew 1 tablet by mouth 2 (two) times daily.   Yes [provider]  carvedilol (COREG) 6.25 MG tablet Take 1 tablet by mouth 2 (two) times daily. 04/05/18  Yes [provider]  cloNIDine (CATAPRES - DOSED IN MG/24 HR) 0.2 mg/24hr patch Place 1 patch onto the skin once a week. 06/01/18  Yes [provider]  cloNIDine (CATAPRES) 0.1 MG tablet Take 1 tablet by mouth 2 (two) times daily. 05/10/18  Yes [provider]  furosemide (LASIX) 80 MG tablet Take 1 tablet by mouth daily. 04/29/18  Yes [provider]  hydrALAZINE (APRESOLINE) 50 MG tablet Take 1 tablet by mouth 3 (three) times daily. 04/05/18  Yes [provider]  isosorbide mononitrate (IMDUR) 30 MG 24 hr tablet Take 1 tablet (30 mg total) by mouth daily. 04/04/18  Yes Hillary Bow, MD  losartan (COZAAR) 100 MG tablet Take 1 tablet (100 mg total) by mouth daily. 10/18/17  Yes Epifanio Lesches, MD  metoprolol tartrate (LOPRESSOR) 50 MG tablet Take 1 tablet (50 mg total) by mouth 2 (two) times daily. 04/04/18  Yes Gladstone Lighter, MD  minoxidil (LONITEN) 10 MG tablet Take 10 mg by mouth 2 (two) times daily.    Yes [provider]  multivitamin (RENA-VIT) TABS tablet Take 1 tablet by mouth at bedtime. 10/18/17  Yes Epifanio Lesches, MD  Nutritional Supplements (FEEDING SUPPLEMENT, NEPRO CARB STEADY,) LIQD Take 237 mLs by mouth  2 (two) times daily between meals. 10/18/17  Yes Epifanio Lesches, MD  omeprazole (PRILOSEC) 20 MG capsule Take 20 mg by mouth daily.   Yes [provider]  oxyCODONE (ROXICODONE) 5 MG immediate release tablet Take 1 tablet (5 mg total) by mouth every 6 (six) hours as needed for severe pain.  06/01/18 06/01/19 Yes Sudini, Alveta Heimlich, MD  predniSONE (DELTASONE) 20 MG tablet Take 1.5 tablets (30 mg total) by mouth daily with breakfast. Patient taking differently: Take 20 mg by mouth daily with breakfast.  10/19/17  Yes Epifanio Lesches, MD  rOPINIRole (REQUIP) 0.5 MG tablet Take 1 tablet by mouth at bedtime. 03/21/18  Yes [provider]   Allergies  Allergen Reactions  . Acetaminophen Nausea And Vomiting    FAMILY HISTORY:  family history is not on file. SOCIAL HISTORY:  reports that he has been smoking cigarettes. He has been smoking about 0.25 packs per day. He has never used smokeless tobacco. He reports current alcohol use. He reports current drug use. Drugs: Marijuana and Cocaine.  REVIEW OF SYSTEMS:   Review of Systems  Constitutional: Negative for chills and fever.  Respiratory: Positive for cough and sputum production. Negative for shortness of breath and wheezing.   Cardiovascular: Negative for chest pain, palpitations and leg swelling.  Gastrointestinal: Positive for abdominal pain (Around the catheter site). Negative for constipation and diarrhea.  Genitourinary: Negative for dysuria and flank pain.  Neurological: Negative for dizziness and headaches.  All other systems reviewed and are negative.   VITAL SIGNS: Temp:  [98.4 F (36.9 C)-100.9 F (38.3 C)] 98.5 F (36.9 C) (01/22 0829) Pulse Rate:  [87-101] 91 (01/22 0900) Resp:  [14-20] 16 (01/22 0900) BP: (75-137)/(42-64) 92/42 (01/22 0900) SpO2:  [92 %-100 %] 94 % (01/22 0900) Weight:  [70.3 kg-73.7 kg] 73.7 kg (01/22 0829)  Physical Examination:  GENERAL: In no acute distress HEAD: Normocephalic, atraumatic.  EYES: Pupils equal, round, reactive to light.  No scleral icterus.  MOUTH: Moist mucosal membrane. NECK: Supple. No JVD.  PULMONARY: Clear lung sound bilaterally CARDIOVASCULAR: S1 and S2. Regular rate and rhythm. No murmurs, rubs, or gallops.  GASTROINTESTINAL: Mild tenderness, mild  distended. No masses. Positive bowel sounds. No hepatosplenomegaly.  MUSCULOSKELETAL: No swelling, clubbing, or edema.  NEUROLOGIC: Awake and alert SKIN: Intact,warm,dry  I personally reviewed lab work that was obtained in last 24 hrs.  Imaging studies independently reviewed CXR -  Patchy bilateral pneumonia Chest CT w/o contrast -  1. Multifocal pneumonia with segmental consolidation in the right upper lobe. 2. Mild emphysema. Abd/Pelvis CT w/o contrast -  1. Peritoneal dialysis and cited a small volume pneumoperitoneum and ascites. 2. Moderate volume retained large bowel stool without bowel obstruction. Mild colonic diverticulosis. 3. LEFT lower lobe atelectasis/scarring, less likely residual/recurrent pneumonia.  ASSESSMENT / PLAN:  Patient is a 33 y.o. male presents to ICU Stepdown for HYPOtension with unspecified peritonitis and PNA.   HYPOtension likely due to sepsis from peritonitis and PNA -Not currently on IV fluid, start 526mL NS -BP in 80s/40s - 90s/50s, last BP 82/44, target MAP > 55 -Continue on vancomycin and Rocephin -Start Neo -Pending peritoneal fluid analysis results, follow up cultures  ESRD on peritoneal dialysis -Follow nephrology recommendation -D/C'd prednisone, start hydrocortisone for stress dose -Will start peritoneal dialysis this after per nephrology  Abdominal pain on peritoneal catheter site -Pain management: morphine PRN  Pt is FULL CODE, Stepdown status  Merton Border, MD PCCM service Mobile [phone] Pager [phone] 06/25/2018 5:07 PM

## 2018-06-25 NOTE — ED Triage Notes (Signed)
Patient ambulatory to triage with steady gait, without difficulty or distress noted; pt reports peritoneal dialysis site and abd is painful (surg last Tuesday to tunnel it deeper) since awakening at 1230am

## 2018-06-25 NOTE — ED Notes (Signed)
Patient had removed O2 via [place] after returning from CT. Patient's sats again dropped to 87-89%. Patient placed back on 2L O2.

## 2018-06-25 NOTE — ED Notes (Signed)
2nd set of cultures drawn as 2nd IV started at this time.

## 2018-06-25 NOTE — ED Notes (Signed)
Date and time results received: 06/25/18 0530  Test: Troponin Critical Value: 0.03  Name of Provider Notified: Karma Greaser  Orders Received? Or Actions Taken?: Acknowledged.

## 2018-06-25 NOTE — Progress Notes (Signed)
CCPD TX Initiated. Pt is calm cooperative, hypotensive, asymptomatic, states pain is getting better, ok to begin tx.    06/25/18 1830  Peritoneal Catheter Right lower abdomen  Placement Date/Time: (c) 03/31/18 0800   Catheter Location: Right lower abdomen  Site Assessment Clean;Dry;Intact  Drainage Description None  Catheter status Accessed  Dressing Gauze/Drain sponge  Dressing Status Clean;Dry;Intact  Cycler Setup  Total Number of Exchanges 5  Fill Volume 2000  Dianeal Solution Dextrose 1.5% in 6000 mL  Fill Time - Minute(s) 10  Dwell Time - Hour(s) 1  Dwell Time - Minute(s) 22  Drain Time - Minute(s) 20 mins  Education / Care Plan  Dialysis Education Provided Yes  Documented Education in Care Plan Yes  Hand-Off documentation  Report given to (Full Name) Beatris Ship, RN

## 2018-06-25 NOTE — Progress Notes (Signed)
[facility]  ROUNDING NOTE   Subjective:   [name] admitted to [facility] on 06/25/2018 for Hypoxemia [R09.02] Generalized abdominal pain [R10.84] SBP (spontaneous bacterial peritonitis) ([place]) [K65.2] Healthcare-associated pneumonia [J18.9] Elevated troponin I level [R79.89] Sepsis, due to unspecified organism, unspecified whether acute organ dysfunction present [facility]) [A41.9]  Patient had manipulation of his PD catheter by Dr. Raul Del on 06/19/18. He was given the wrong transfer set and he changed it out to the [facility] transfer set. Unfortunately there was contamination during this exchange.   Patient presented to clinic yesterday and saw Dr. Holley Raring. He was empirically given IP vanco and ceftazidime. Patient reports not doing his entire treatment. Holding last fill. Doing 2 liter fills at night.   Patient woke up at midnight with severe abdominal pain. Empirically vanco, levofloxacin and cetriaxone. Started on stress dose steroids.   Objective:  Vital signs in last 24 hours:  Temp:  [98.4 F (36.9 C)-100.9 F (38.3 C)] 98.5 F (36.9 C) (01/22 0829) Pulse Rate:  [87-101] 91 (01/22 0900) Resp:  [14-20] 16 (01/22 0900) BP: (75-137)/(42-64) 92/42 (01/22 0900) SpO2:  [92 %-100 %] 94 % (01/22 0900) Weight:  [70.3 kg-73.7 kg] 73.7 kg (01/22 0829)  Weight change:  Filed Weights   06/25/18 0300 06/25/18 0829  Weight: 70.3 kg 73.7 kg    Intake/Output: I/O last 3 completed shifts: In: 2350 [IV Piggyback:2350] Out: -    Intake/Output this shift:  Total I/O In: 306.9 [IV Piggyback:306.9] Out: -   Physical Exam: General: NAD,   Head: Normocephalic, atraumatic. Moist oral mucosal membranes  Eyes: Anicteric, PERRL  Neck: Supple, trachea midline  Lungs:  Clear to auscultation  Heart: Regular rate and rhythm  Abdomen:  +tender  Extremities:  no peripheral edema.  Neurologic: Nonfocal, moving all four extremities  Skin: No lesions  Access: PD catheter -  tender at exit site    Basic Metabolic Panel: Recent Labs  Lab 06/25/18 0322  NA 139  K 3.2*  CL 97*  CO2 27  GLUCOSE 92  BUN 65*  CREATININE 15.27*  CALCIUM 8.0*    Liver Function Tests: Recent Labs  Lab 06/25/18 0322  AST 20  ALT <5  ALKPHOS 54  BILITOT 0.6  PROT 6.4*  ALBUMIN 3.2*   Recent Labs  Lab 06/25/18 0322  LIPASE 68*   No results for input(s): AMMONIA in the last 168 hours.  CBC: Recent Labs  Lab 06/25/18 0322  WBC 19.6*  NEUTROABS 15.7*  HGB 7.7*  HCT 24.9*  MCV 100.0  PLT 274    Cardiac Enzymes: Recent Labs  Lab 06/25/18 0322  TROPONINI 0.03*    BNP: Invalid input(s): POCBNP  CBG: Recent Labs  Lab 06/25/18 0830  GLUCAP 56    Microbiology: Results for orders placed or performed during the hospital encounter of 06/25/18  Blood Culture (routine x 2)     Status: None (Preliminary result)   Collection Time: 06/25/18  3:22 AM  Result Value Ref Range Status   Specimen Description BLOOD RIGHT [facility]  Final   Special Requests   Final    BOTTLES DRAWN AEROBIC AND ANAEROBIC Blood Culture results may not be optimal due to an excessive volume of blood received in culture bottles   Culture   Final    NO GROWTH < 12 HOURS Performed at [facility], [address], [place], [place] [address]    Report Status PENDING  Incomplete  Blood Culture (routine x 2)  Status: None (Preliminary result)   Collection Time: 06/25/18  5:00 AM  Result Value Ref Range Status   Specimen Description BLOOD LEFT WRIST  Final   Special Requests   Final    BOTTLES DRAWN AEROBIC AND ANAEROBIC Blood Culture adequate volume   Culture   Final    NO GROWTH < 12 HOURS Performed at [facility], [address] [place], [place] [address]    Report Status PENDING  Incomplete    Coagulation Studies: Recent Labs    06/25/18 0322  LABPROT 14.8  INR 1.17    Urinalysis: No results for input(s): COLORURINE, LABSPEC, PHURINE, GLUCOSEU,  HGBUR, BILIRUBINUR, KETONESUR, PROTEINUR, UROBILINOGEN, NITRITE, LEUKOCYTESUR in the last 72 hours.  Invalid input(s): APPERANCEUR    Imaging: Ct Abdomen Pelvis Wo Contrast  Result Date: 06/25/2018 CLINICAL DATA:  Abdominal pain beginning tonight. History of peritoneal dialysis, with abdominal surgery 1 week ago. History kidney infection and appendectomy. EXAM: CT ABDOMEN AND PELVIS WITHOUT CONTRAST TECHNIQUE: Multidetector CT imaging of the abdomen and pelvis was performed following the standard protocol without IV contrast. COMPARISON:  CT abdomen and pelvis May 29, 2018. FINDINGS: LOWER CHEST: Persistent consolidation LEFT lower lobe, resolution of pleural effusion. Included heart is mildly enlarged. Minimal RIGHT lung base atelectasis. HEPATOBILIARY: Normal. PANCREAS: Normal. SPLEEN: Normal. ADRENALS/URINARY TRACT: Kidneys are orthotopic, demonstrating normal size and morphology. No nephrolithiasis, hydronephrosis; limited assessment for renal masses by nonenhanced CT. The unopacified ureters are normal in course and caliber. Urinary bladder is partially distended and unremarkable. Normal adrenal glands. STOMACH/BOWEL: The stomach, small and large bowel are normal in course and caliber without inflammatory changes, sensitivity decreased by lack of enteric contrast. Moderate volume retained large bowel stool. Mild colonic diverticulosis. VASCULAR/LYMPHATIC: Aortoiliac vessels are normal in course and caliber. No lymphadenopathy by CT size criteria. REPRODUCTIVE: Normal. OTHER: Intraperitoneal dialysis catheter distal tip in stomach. Small volume pneumoperitoneum and ascites. MUSCULOSKELETAL: Non-acute.  Small fat containing umbilical hernia. IMPRESSION: 1. Peritoneal dialysis and cited a small volume pneumoperitoneum and ascites. 2. Moderate volume retained large bowel stool without bowel obstruction. Mild colonic diverticulosis. 3. LEFT lower lobe atelectasis/scarring, less likely residual/recurrent  pneumonia. Electronically Signed   By: Elon Alas M.D.   On: 06/25/2018 04:39   Dg Chest 2 View  Result Date: 06/25/2018 CLINICAL DATA:  Hypoxia EXAM: CHEST - 2 VIEW COMPARISON:  05/23/2018 FINDINGS: Patchy and streaky bilateral airspace opacity greatest on the right. Normal heart size. No edema, effusion, or pneumothorax. Pneumoperitoneum correlating with peritoneal dialysis catheter. IMPRESSION: Patchy bilateral pneumonia. Electronically Signed   By: Monte Fantasia M.D.   On: 06/25/2018 04:37   Ct Chest Wo Contrast  Result Date: 06/25/2018 CLINICAL DATA:  Recent healthcare pneumonia. Sepsis. Evaluate for recurrent pneumonia EXAM: CT CHEST WITHOUT CONTRAST TECHNIQUE: Multidetector CT imaging of the chest was performed following the standard protocol without IV contrast. COMPARISON:  None. FINDINGS: Cardiovascular: Normal heart size.  No pericardial effusion Mediastinum/Nodes: Negative for adenopathy or mass. Lungs/Pleura: Multifocal airspace disease with reticulonodular appearance in the left lung and segmental consolidation in the right upper lobe. Mild superimposed atelectasis greatest in the left lower lobe. No cavitation or effusion. There is airway thickening and mild centrilobular emphysema. Upper Abdomen: Pneumoperitoneum. Patient is on peritoneal dialysis. There was preceding abdominal CT earlier today. Musculoskeletal: No acute or aggressive finding. IMPRESSION: 1. Multifocal pneumonia with segmental consolidation in the right upper lobe. 2. Mild emphysema. Electronically Signed   By: Monte Fantasia M.D.   On: 06/25/2018 06:05  Medications:   . cefTRIAXone (ROCEPHIN)  IV Stopped (06/25/18 0349)  . [START ON 06/26/2018] vancomycin     . ALPRAZolam  0.25 mg Oral BID  . calcium acetate  1,334 mg Oral TID WC  . calcium carbonate  1 tablet Oral BID  . docusate sodium  100 mg Oral BID  . heparin  5,000 Units Subcutaneous Q8H  . hydrocortisone sod succinate (SOLU-CORTEF) inj  50  mg Intravenous Q6H  . pantoprazole  40 mg Oral Daily  . polyethylene glycol  17 g Oral BID  . rOPINIRole  0.5 mg Oral QHS   acetaminophen **OR** acetaminophen, albuterol, morphine injection, ondansetron **OR** ondansetron (ZOFRAN) IV, oxyCODONE, senna-docusate  Assessment/ Plan:  [name] is a 33 y.o. white male with end stage renal disease on peritoneal dialysis, hypertension, crescentic glomerulonephritis, who is admitted to [facility] on 06/25/2018 for Hypoxemia [R09.02] Generalized abdominal pain [R10.84] SBP (spontaneous bacterial peritonitis) ([place]) [K65.2] Healthcare-associated pneumonia [J18.9] Elevated troponin I level [R79.89] Sepsis, due to unspecified organism, unspecified whether acute organ dysfunction present ([place]) [A41.9]  [facility] Dr. Zada Finders  CCPD 9 hrs 4 x 258mL last fill icodextrin 2000 cc  1.  End-stage renal disease: on peritoneal dialysis. Restart CCPD with 9 hours 2 liter fills.  Holding last fill with extraneal.  Continues steroids due to glomerulonephritis.   2. Abdominal pain/sepsis/peritonitis: cultures and cell count sent. Empiric vanco, levofloxacin and cetriaxone Contamination done during exchange of transfer set at home.   3. Hypotension: with history of difficult to control hypertension. Holding home medications: clonidine patch, carvedilol, furosemide, hydralazine, isosorbide, metoprolol, minoxidil Secondary to sepsis - holding home blood pressure medications.   4.  Anemia chronic kidney disease: hemoglobin 7.7 Received ESA as outpatient yesterday.    LOS: 0 Averleigh Savary 1/22/20209:50 AM

## 2018-06-25 NOTE — Progress Notes (Signed)
Admitted early this morning. Seen and examined by me later in the morning. States he is still having pretty significant abdominal pain. Still feeling sick.  On exam, lungs CTAB. RRR. +diffuse abdominal tenderness, no rebound or guarding.  -Agree with admission H&P -Continue empiric IV antibiotics -Nephrology following  Hyman Bible, MD

## 2018-06-25 NOTE — ED Notes (Signed)
Patient's O2 sats dropped to 89% on RA while resting. Placed patient on 2L O2 with slow increase to 94-96%.

## 2018-06-25 NOTE — Progress Notes (Signed)
CODE SEPSIS - PHARMACY COMMUNICATION  **Broad Spectrum Antibiotics should be administered within 1 hour of Sepsis diagnosis**  Time Code Sepsis Called/Page Received: [id number] [id number]  Antibiotics Ordered: [id number]  Time of 1st antibiotic administration: [id number]  Additional action taken by pharmacy:   If necessary, Name of Provider/Nurse Contacted:     Eloise Harman ,PharmD Clinical Pharmacist  06/25/2018  5:35 AM

## 2018-06-25 NOTE — Progress Notes (Signed)
Pharmacy Antibiotic Note  Jeffrey Holloway is a 33 y.o. male admitted on 06/25/2018 with HCAP.  Pharmacy has been consulted for vancomycin dosing. Patient is a PD patient and received vancomycin and ceftazidime in the nephrology clinic on 1/21 and ceftriaxone, vancomycin load and levofloxacin in ED on 1/22.   Plan: Will order vancomycin random level on 1/23 and dose accordingly.   Pharmacy will continue to monitor and adjust per consult.   Height: 6' (182.9 cm) Weight: 162 lb 7.7 oz (73.7 kg) IBW/kg (Calculated) : 77.6  Temp (24hrs), Avg:99 F (37.2 C), Min:98.2 F (36.8 C), Max:100.9 F (38.3 C)  Recent Labs  Lab 06/25/18 0322  WBC 19.6*  CREATININE 15.27*  LATICACIDVEN 0.8    Estimated Creatinine Clearance: 7.1 mL/min (A) (by C-G formula based on SCr of 15.27 mg/dL (H)).    Allergies  Allergen Reactions  . Acetaminophen Nausea And Vomiting    Antimicrobials this admission: Ceftazidime 1/21 >>   vancomycin 1/21 >> ceftriaxone 1/22  >>  Levofloxacin 1/22 x 1.   Dose adjustments this admission: N/A   Microbiology results: 1/22 BCx: no growth < 12 hours  1/22 PD fluid: pending  1/22 MRSA PCR: negative    Thank you for allowing pharmacy to be a part of this patient's care.  Renold Kozar L 06/25/2018 4:59 PM

## 2018-06-25 NOTE — ED Notes (Signed)
Dr. Karma Greaser aware of most recent vitals. Patient refused Tylenol at this time, stating "It makes my stomach hurt.".

## 2018-06-25 NOTE — Plan of Care (Signed)
Pt admitted to ICU this shift due to hypotension. Has had frequent complaints of abd pain related to PD catheter; has been medicated with PRN pain meds as ordered. BP has been as low as SBPs in 60s with MAPs in lows 50s, total of 1535ml in NS bolus given this shift with improvement to SBPs in low 100s. Vital signs otherwise WNL, pt asymptomatic with hypotension. Plan to perform PD tonight per Dr. Juleen China.

## 2018-06-25 NOTE — Consult Note (Signed)
[facility] Vascular Consult Note  MRN : [id number]  Jeffrey Holloway is a 33 y.o. (29-Sep-1985) male who presents with chief complaint of  Chief Complaint  Patient presents with  . Abdominal Pain  .  History of Present Illness: I am asked to see the patient by Dr. Juleen China for possible PermCath infection.  The patient had a peritoneal dialysis catheter placed in [facility] a few months ago.  It has been manipulated once with a revision.  The outer tubing was changed earlier this week and the area is very tender.  The patient went to the ER last night due to abdominal pain and was found to have low blood pressure and is admitted for sepsis.  The area around the catheter remains tender.  The rest of the abdomen is not tender.  He does have hypotension and he is being started on pressors by the intensive care service.  He is mentating.  He is adamant that he does not want to do hemodialysis unless he absolutely has to  Current Facility-Administered Medications  Medication Dose Route Frequency Provider Last Rate Last Dose  . acetaminophen (TYLENOL) tablet 650 mg  650 mg Oral Q6H PRN Hillary Bow, MD       Or  . acetaminophen (TYLENOL) suppository 650 mg  650 mg Rectal Q6H PRN Sudini, Srikar, MD      . albuterol (PROVENTIL) (2.5 MG/3ML) 0.083% nebulizer solution 2.5 mg  2.5 mg Nebulization Q2H PRN Sudini, Alveta Heimlich, MD      . ALPRAZolam Duanne Moron) tablet 0.25 mg  0.25 mg Oral BID Hillary Bow, MD   0.25 mg at 06/25/18 0847  . calcium acetate (PHOSLO) capsule 1,334 mg  1,334 mg Oral TID WC Hillary Bow, MD   1,334 mg at 06/25/18 1319  . calcium carbonate (TUMS - dosed in mg elemental calcium) chewable tablet 200 mg of elemental calcium  1 tablet Oral BID Hillary Bow, MD   200 mg of elemental calcium at 06/25/18 0847  . [START ON 06/26/2018] cefTRIAXone (ROCEPHIN) 2 g in sodium chloride 0.9 % 100 mL IVPB  2 g Intravenous Q24H Charlett Nose, RPH      . docusate sodium  (COLACE) capsule 100 mg  100 mg Oral BID Hillary Bow, MD   100 mg at 06/25/18 0847  . epoetin alfa (EPOGEN,PROCRIT) injection 30,000 Units  30,000 Units Subcutaneous Once Mayo, Pete Pelt, MD      . epoetin alfa (EPOGEN,PROCRIT) injection 4,000 Units  4,000 Units Subcutaneous Once Kolluru, Sarath, MD      . heparin injection 5,000 Units  5,000 Units Subcutaneous Q8H Sudini, Srikar, MD   5,000 Units at 06/25/18 1313  . hydrocortisone sodium succinate (SOLU-CORTEF) 100 MG injection 50 mg  50 mg Intravenous Q6H Wilhelmina Mcardle, MD   50 mg at 06/25/18 1445  . iron sucrose (VENOFER) 100 mg in sodium chloride 0.9 % 100 mL IVPB  100 mg Intravenous Once Kolluru, Sarath, MD 200 mL/hr at 06/25/18 1510 100 mg at 06/25/18 1510  . morphine 2 MG/ML injection 2 mg  2 mg Intravenous Q4H PRN Hillary Bow, MD   2 mg at 06/25/18 1319  . ondansetron (ZOFRAN) tablet 4 mg  4 mg Oral Q6H PRN Hillary Bow, MD       Or  . ondansetron (ZOFRAN) injection 4 mg  4 mg Intravenous Q6H PRN Hillary Bow, MD   4 mg at 06/25/18 0845  . oxyCODONE (Oxy IR/ROXICODONE) immediate release tablet 5 mg  5 mg Oral Q6H PRN Hillary Bow, MD   5 mg at 06/25/18 1041  . pantoprazole (PROTONIX) EC tablet 40 mg  40 mg Oral Daily Hillary Bow, MD   40 mg at 06/25/18 0847  . phenylephrine (NEO-SYNEPHRINE) 10 mg in sodium chloride 0.9 % 250 mL (0.04 mg/mL) infusion  0-400 mcg/min Intravenous Titrated Mayo, Pete Pelt, MD      . polyethylene glycol (MIRALAX / GLYCOLAX) packet 17 g  17 g Oral BID Sudini, Srikar, MD      . rOPINIRole (REQUIP) tablet 0.5 mg  0.5 mg Oral QHS Sudini, Srikar, MD      . senna-docusate (Senokot-S) tablet 1 tablet  1 tablet Oral QHS PRN Sudini, Srikar, MD      . sodium chloride 0.9 % bolus 1,000 mL  1,000 mL Intravenous Once Awilda Bill, NP 999 mL/hr at 06/25/18 1444 1,000 mL at 06/25/18 1444  . [START ON 06/26/2018] vancomycin (VANCOCIN) IVPB 750 mg/150 ml premix  750 mg Intravenous Q24H Charlett Nose, [facility]         Past Medical History:  Diagnosis Date  . Constipation   . Diarrhea   . Glomerulonephritis   . Hemorrhoids   . Kidney infection   . Nausea and vomiting   . Occult blood in stools   . Shortness of breath   . Sleep apnea     Past Surgical History:  Procedure Laterality Date  . APPENDECTOMY    . DIALYSIS/PERMA CATHETER INSERTION N/A 10/15/2017   Procedure: DIALYSIS/PERMA CATHETER INSERTION;  Surgeon: Katha Cabal, MD;  Location: [facility];  Service: Cardiovascular;  Laterality: N/A;  . DIALYSIS/PERMA CATHETER REMOVAL N/A 05/12/2018   Procedure: DIALYSIS/PERMA CATHETER REMOVAL;  Surgeon: Algernon Huxley, MD;  Location: [facility];  Service: Cardiovascular;  Laterality: N/A;    Social History Social History   Tobacco Use  . Smoking status: Current Every Day Smoker    Packs/day: 0.25    Types: Cigarettes  . Smokeless tobacco: Never Used  Substance Use Topics  . Alcohol use: Yes  . Drug use: Yes    Types: Marijuana, Cocaine    Family History Family History  Problem Relation Age of Onset  . Varicose Veins Neg Hx   . Vision loss Neg Hx   . Stroke Neg Hx   No bleeding or clotting disorders  Allergies  Allergen Reactions  . Acetaminophen Nausea And Vomiting     REVIEW OF SYSTEMS (Negative unless checked)  Constitutional: [] Weight loss  [x] Fever  [] Chills Cardiac: [] Chest pain   [] Chest pressure   [] Palpitations   [] Shortness of breath when laying flat   [] Shortness of breath at rest   [] Shortness of breath with exertion. Vascular:  [] Pain in legs with walking   [] Pain in legs at rest   [] Pain in legs when laying flat   [] Claudication   [] Pain in feet when walking  [] Pain in feet at rest  [] Pain in feet when laying flat   [] History of DVT   [] Phlebitis   [] Swelling in legs   [] Varicose veins   [] Non-healing ulcers Pulmonary:   [] Uses home oxygen   [] Productive cough   [] Hemoptysis   [] Wheeze  [] COPD   [] Asthma Neurologic:  [] Dizziness   [] Blackouts   [] Seizures   [] History of stroke   [] History of TIA  [] Aphasia   [] Temporary blindness   [] Dysphagia   [] Weakness or numbness in arms   [] Weakness or numbness in legs Musculoskeletal:  [] Arthritis   []   Joint swelling   [] Joint pain   [] Low back pain Hematologic:  [] Easy bruising  [] Easy bleeding   [] Hypercoagulable state   [] Anemic  [] Hepatitis Gastrointestinal:  [] Blood in stool   [] Vomiting blood  [x] Gastroesophageal reflux/heartburn   [] Difficulty swallowing. Genitourinary:  [] Chronic kidney disease   [] Difficult urination  [] Frequent urination  [] Burning with urination   [] Blood in urine Skin:  [] Rashes   [] Ulcers   [] Wounds Psychological:  [] History of anxiety   []  History of major depression.  Physical Examination  Vitals:   06/25/18 1245 06/25/18 1300 06/25/18 1400 06/25/18 1430  BP: (!) 91/51 (!) 80/39 (!) 84/35 (!) 67/46  Pulse: 84 83 81 76  Resp: 16 13 13 11   Temp:      TempSrc:      SpO2: 94% 96% 92% 94%  Weight:      Height:       Body mass index is 22.04 kg/m. Gen:  WD/WN, NAD Head: [place]/AT, No temporalis wasting.  Ear/Nose/Throat: Hearing grossly intact, nares w/o erythema or drainage, oropharynx w/o Erythema/Exudate Eyes: Sclera non-icteric, conjunctiva clear Neck: Trachea midline.   Pulmonary:  Good air movement, respirations not labored, equal bilaterally.  Cardiac: RRR, no JVD Vascular:  Vessel Right Left  Radial Palpable Palpable                          PT Palpable Palpable  DP Palpable Palpable   Gastrointestinal: soft, nondistended.  The patient does not have rebound or guarding.  He does have tenderness around the catheter exit site with mild erythema.  There is no purulent drainage coming from around the catheter. Musculoskeletal: M/S 5/5 throughout.  Extremities without ischemic changes.  No deformity or atrophy. No edema. Neurologic: Sensation grossly intact in extremities.  Symmetrical.  Speech is fluent. Motor exam as listed  above. Psychiatric: Judgment intact, Mood & affect appropriate for pt's clinical situation. Dermatologic: No rashes or ulcers noted.  No cellulitis or open wounds.       CBC Lab Results  Component Value Date   WBC 19.6 (H) 06/25/2018   HGB 7.7 (L) 06/25/2018   HCT 24.9 (L) 06/25/2018   MCV 100.0 06/25/2018   PLT 274 06/25/2018    BMET    Component Value Date/Time   NA 139 06/25/2018 0322   NA 134 (L) 01/17/2012 0126   K 3.2 (L) 06/25/2018 0322   K 3.7 01/17/2012 0126   CL 97 (L) 06/25/2018 0322   CL 100 01/17/2012 0126   CO2 27 06/25/2018 0322   CO2 27 01/17/2012 0126   GLUCOSE 92 06/25/2018 0322   GLUCOSE 98 01/17/2012 0126   BUN 65 (H) 06/25/2018 0322   BUN 7 01/17/2012 0126   CREATININE 15.27 (H) 06/25/2018 0322   CREATININE 0.94 01/17/2012 0126   CALCIUM 8.0 (L) 06/25/2018 0322   CALCIUM 7.1 (L) 10/10/2017 1400   GFRNONAA 4 (L) 06/25/2018 0322   GFRNONAA >60 01/17/2012 0126   GFRAA 4 (L) 06/25/2018 0322   GFRAA >60 01/17/2012 0126   Estimated Creatinine Clearance: 7.1 mL/min (A) (by C-G formula based on SCr of 15.27 mg/dL (H)).  COAG Lab Results  Component Value Date   INR 1.17 06/25/2018   INR 0.95 10/14/2017   INR 0.93 10/14/2017    Radiology Ct Abdomen Pelvis Wo Contrast  Result Date: 06/25/2018 CLINICAL DATA:  Abdominal pain beginning tonight. History of peritoneal dialysis, with abdominal surgery 1 week ago. History kidney infection and appendectomy. EXAM:  CT ABDOMEN AND PELVIS WITHOUT CONTRAST TECHNIQUE: Multidetector CT imaging of the abdomen and pelvis was performed following the standard protocol without IV contrast. COMPARISON:  CT abdomen and pelvis May 29, 2018. FINDINGS: LOWER CHEST: Persistent consolidation LEFT lower lobe, resolution of pleural effusion. Included heart is mildly enlarged. Minimal RIGHT lung base atelectasis. HEPATOBILIARY: Normal. PANCREAS: Normal. SPLEEN: Normal. ADRENALS/URINARY TRACT: Kidneys are orthotopic,  demonstrating normal size and morphology. No nephrolithiasis, hydronephrosis; limited assessment for renal masses by nonenhanced CT. The unopacified ureters are normal in course and caliber. Urinary bladder is partially distended and unremarkable. Normal adrenal glands. STOMACH/BOWEL: The stomach, small and large bowel are normal in course and caliber without inflammatory changes, sensitivity decreased by lack of enteric contrast. Moderate volume retained large bowel stool. Mild colonic diverticulosis. VASCULAR/LYMPHATIC: Aortoiliac vessels are normal in course and caliber. No lymphadenopathy by CT size criteria. REPRODUCTIVE: Normal. OTHER: Intraperitoneal dialysis catheter distal tip in stomach. Small volume pneumoperitoneum and ascites. MUSCULOSKELETAL: Non-acute.  Small fat containing umbilical hernia. IMPRESSION: 1. Peritoneal dialysis and cited a small volume pneumoperitoneum and ascites. 2. Moderate volume retained large bowel stool without bowel obstruction. Mild colonic diverticulosis. 3. LEFT lower lobe atelectasis/scarring, less likely residual/recurrent pneumonia. Electronically Signed   By: Elon Alas M.D.   On: 06/25/2018 04:39   Ct Abdomen Pelvis Wo Contrast  Result Date: 05/29/2018 CLINICAL DATA:  Abdominal pain for 2-3 weeks EXAM: CT ABDOMEN AND PELVIS WITHOUT CONTRAST TECHNIQUE: Multidetector CT imaging of the abdomen and pelvis was performed following the standard protocol without IV contrast. COMPARISON:  05/24/2018 FINDINGS: Lower chest: Lung bases demonstrate some patchy infiltrative change in the left lower lobe slightly increased from the prior exam. Hepatobiliary: No focal liver abnormality is seen. No gallstones, gallbladder wall thickening, or biliary dilatation. Pancreas: Unremarkable. No pancreatic ductal dilatation or surrounding inflammatory changes. Spleen: Normal in size without focal abnormality. Adrenals/Urinary Tract: Adrenal glands are within normal limits. Kidneys  are well visualized bilaterally. No obstructive changes are seen. No renal calculi are noted. The bladder is decompressed. Stomach/Bowel: Scattered diverticular change in the sigmoid is noted without evidence of diverticulitis. Changes of prior appendectomy are seen. No small bowel dilatation is noted. Vascular/Lymphatic: No significant vascular findings are present. No enlarged abdominal or pelvic lymph nodes. Reproductive: Prostate is unremarkable. Other: Peritoneal dialysis catheter is noted deep within the pelvis. Free fluid is noted within the abdomen and pelvis likely related to dialysate. Small fat containing hernia is again noted at the umbilicus. Musculoskeletal: No acute bony abnormality is noted. Previously seen hip effusions bilaterally have resolved. IMPRESSION: Free fluid within the abdomen consistent with peritoneal dialysate. Mild left lower lobe pneumonia. Diverticulosis without diverticulitis. Electronically Signed   By: Inez Catalina M.D.   On: 05/29/2018 23:30   Dg Chest 2 View  Result Date: 06/25/2018 CLINICAL DATA:  Hypoxia EXAM: CHEST - 2 VIEW COMPARISON:  05/23/2018 FINDINGS: Patchy and streaky bilateral airspace opacity greatest on the right. Normal heart size. No edema, effusion, or pneumothorax. Pneumoperitoneum correlating with peritoneal dialysis catheter. IMPRESSION: Patchy bilateral pneumonia. Electronically Signed   By: Monte Fantasia M.D.   On: 06/25/2018 04:37   Ct Chest Wo Contrast  Result Date: 06/25/2018 CLINICAL DATA:  Recent healthcare pneumonia. Sepsis. Evaluate for recurrent pneumonia EXAM: CT CHEST WITHOUT CONTRAST TECHNIQUE: Multidetector CT imaging of the chest was performed following the standard protocol without IV contrast. COMPARISON:  None. FINDINGS: Cardiovascular: Normal heart size.  No pericardial effusion Mediastinum/Nodes: Negative for adenopathy or mass. Lungs/Pleura: Multifocal airspace disease with  reticulonodular appearance in the left lung and  segmental consolidation in the right upper lobe. Mild superimposed atelectasis greatest in the left lower lobe. No cavitation or effusion. There is airway thickening and mild centrilobular emphysema. Upper Abdomen: Pneumoperitoneum. Patient is on peritoneal dialysis. There was preceding abdominal CT earlier today. Musculoskeletal: No acute or aggressive finding. IMPRESSION: 1. Multifocal pneumonia with segmental consolidation in the right upper lobe. 2. Mild emphysema. Electronically Signed   By: Monte Fantasia M.D.   On: 06/25/2018 06:05      Assessment/Plan 1.  Sepsis.  Concern is for peritoneal dialysis catheter infection driving the sepsis.  Cultures have been sent from the fluid today.  It is tender to palpation.  He also has a pneumonia and this could potentially be the source.  We have discussed that if the PD catheter is infected, it will need to be removed.  At current, the patient is declining removal.  We will reassess tomorrow to determine how his infectious parameters are doing and whether his sepsis continues. 2.  ESRD.  Patient is very adamant he does not want to go back to hemodialysis and wants to do everything possible to continue peritoneal dialysis.  I told him that may or may not be an option, but we can try antibiotics for now and see if he improves    Leotis Pain, MD  06/25/2018 3:24 PM    This note was created with Dragon medical transcription system.  Any error is purely unintentional

## 2018-06-25 NOTE — ED Provider Notes (Addendum)
[facility] Emergency Department Provider Note  ____________________________________________   First MD Initiated Contact with Patient 06/25/18 [phone]     (approximate)  I have reviewed the triage vital signs and the nursing notes.   HISTORY  Chief Complaint Abdominal Pain    HPI Jeffrey Holloway is a 33 y.o. male whose medical history notably includes end-stage renal disease on peritoneal dialysis and who was treated within the last month for healthcare associated pneumonia.  He presents tonight by private vehicle for evaluation of acute onset abdominal pain as well as fever and some shortness of breath.  He reports that about a week ago he had surgery at [facility] to have his peritoneal dialysis catheter tunneled deeper into his abdomen.  This was at the request of his usual nephrologist, Dr. Holley Raring.  The patient has been doing well but awoke during the night tonight with severe pain at the site of the catheter.  Additionally he is having some generalized abdominal pain and was found to be febrile upon arrival in the emergency department as well as being slightly short of breath with a cough and nasal congestion recently.  He completed his course of treatment for healthcare associated pneumonia for which she was hospitalized at [facility] about a month ago.  He reports that all of his symptoms are severe, relatively acute on onset, nothing makes the symptoms better.  Moving around makes the pain in his abdomen worse.  He has had no nausea nor vomiting.  Past Medical History:  Diagnosis Date  . Constipation   . Diarrhea   . Glomerulonephritis   . Hemorrhoids   . Kidney infection   . Nausea and vomiting   . Occult blood in stools   . Shortness of breath   . Sleep apnea     Patient Active Problem List   Diagnosis Date Noted  . SBP (spontaneous bacterial peritonitis) ([place]) 06/25/2018  . HCAP (healthcare-associated pneumonia) 05/30/2018  . Peritonitis  ([place]) 05/29/2018  . Abdominal pain 05/24/2018  . Hypertensive urgency, malignant 03/31/2018  . End-stage renal disease on peritoneal dialysis ([place]) 01/22/2018  . Essential hypertension 01/22/2018  . Cellulitis 11/25/2017  . 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 GERD (gastroesophageal reflux disease) 10/10/2017  . Rectal foreign body     Past Surgical History:  Procedure Laterality Date  . APPENDECTOMY    . DIALYSIS/PERMA CATHETER INSERTION N/A 10/15/2017   Procedure: DIALYSIS/PERMA CATHETER INSERTION;  Surgeon: Katha Cabal, MD;  Location: [facility];  Service: Cardiovascular;  Laterality: N/A;  . DIALYSIS/PERMA CATHETER REMOVAL N/A 05/12/2018   Procedure: DIALYSIS/PERMA CATHETER REMOVAL;  Surgeon: Algernon Huxley, MD;  Location: [facility];  Service: Cardiovascular;  Laterality: N/A;    Prior to Admission medications   Medication Sig Start Date End Date Taking? Authorizing Provider  ALPRAZolam Duanne Moron) 0.25 MG tablet Take 1 tablet by mouth 2 (two) times daily. 05/02/18  Yes [provider]  amLODipine (NORVASC) 10 MG tablet Take 1 tablet (10 mg total) by mouth daily. 10/18/17  Yes Epifanio Lesches, MD  calcium acetate (PHOSLO) 667 MG capsule Take 2 capsules (1,334 mg total) by mouth 3 (three) times daily with meals. 10/18/17  Yes Epifanio Lesches, MD  calcium carbonate (TUMS - DOSED IN MG ELEMENTAL CALCIUM) 500 MG chewable tablet Chew 1 tablet by mouth 2 (two) times daily.   Yes [provider]  carvedilol (COREG) 6.25 MG tablet Take 1 tablet by mouth 2 (two) times daily. 04/05/18  Yes [provider]  cloNIDine (CATAPRES - DOSED IN MG/24 HR) 0.2 mg/24hr patch Place 1 patch onto the skin once a week. 06/01/18  Yes [provider]  cloNIDine (CATAPRES) 0.1 MG tablet Take 1 tablet by mouth 2 (two) times daily. 05/10/18  Yes [provider]  furosemide (LASIX) 80 MG tablet Take 1 tablet by mouth daily. 04/29/18  Yes  [provider]  hydrALAZINE (APRESOLINE) 50 MG tablet Take 1 tablet by mouth 3 (three) times daily. 04/05/18  Yes [provider]  isosorbide mononitrate (IMDUR) 30 MG 24 hr tablet Take 1 tablet (30 mg total) by mouth daily. 04/04/18  Yes Hillary Bow, MD  losartan (COZAAR) 100 MG tablet Take 1 tablet (100 mg total) by mouth daily. 10/18/17  Yes Epifanio Lesches, MD  metoprolol tartrate (LOPRESSOR) 50 MG tablet Take 1 tablet (50 mg total) by mouth 2 (two) times daily. 04/04/18  Yes Gladstone Lighter, MD  minoxidil (LONITEN) 10 MG tablet Take 10 mg by mouth 2 (two) times daily.    Yes [provider]  multivitamin (RENA-VIT) TABS tablet Take 1 tablet by mouth at bedtime. 10/18/17  Yes Epifanio Lesches, MD  Nutritional Supplements (FEEDING SUPPLEMENT, NEPRO CARB STEADY,) LIQD Take 237 mLs by mouth 2 (two) times daily between meals. 10/18/17  Yes Epifanio Lesches, MD  omeprazole (PRILOSEC) 20 MG capsule Take 20 mg by mouth daily.   Yes [provider]  oxyCODONE (ROXICODONE) 5 MG immediate release tablet Take 1 tablet (5 mg total) by mouth every 6 (six) hours as needed for severe pain. 06/01/18 06/01/19 Yes Sudini, Alveta Heimlich, MD  predniSONE (DELTASONE) 20 MG tablet Take 1.5 tablets (30 mg total) by mouth daily with breakfast. Patient taking differently: Take 20 mg by mouth daily with breakfast.  10/19/17  Yes Epifanio Lesches, MD  rOPINIRole (REQUIP) 0.5 MG tablet Take 1 tablet by mouth at bedtime. 03/21/18  Yes [provider]    Allergies Acetaminophen  Family History  Problem Relation Age of Onset  . Varicose Veins Neg Hx   . Vision loss Neg Hx   . Stroke Neg Hx     Social History Social History   Tobacco Use  . Smoking status: Current Every Day Smoker    Packs/day: 0.25    Types: Cigarettes  . Smokeless tobacco: Never Used  Substance Use Topics  . Alcohol use: Yes  . Drug use: Yes    Types: Marijuana, Cocaine    Review of  Systems Constitutional: Fever in ED triage Eyes: No visual changes. ENT: Mild sore throat with nasal congestion. Cardiovascular: Denies chest pain. Respiratory: Shortness of breath and cough Gastrointestinal: Abdominal pain as described above Genitourinary: Pain at the site of his peritoneal dialysis catheter. Musculoskeletal: Negative for neck pain.  Negative for back pain. Integumentary: Negative for rash. Neurological: Negative for headaches, focal weakness or numbness.   ____________________________________________   PHYSICAL EXAM:  VITAL SIGNS: ED Triage Vitals  Enc Vitals Group     BP 06/25/18 0303 137/64     Pulse Rate 06/25/18 0303 (!) 101     Resp 06/25/18 0303 20     Temp 06/25/18 0303 99.8 F (37.7 C)     Temp Source 06/25/18 0303 Oral     SpO2 06/25/18 0303 95 %     Weight 06/25/18 0300 70.3 kg (155 lb)     Height 06/25/18 0300 1.829 m (6')     Head Circumference --      Peak Flow --  Pain Score 06/25/18 0300 8     Pain Loc --      Pain Edu? --      Excl. in [place]? --     Constitutional: Alert and oriented.  Appears uncomfortable but nontoxic. Eyes: Conjunctivae are normal.  Head: Atraumatic. Nose: No congestion/rhinnorhea. Mouth/Throat: Mucous membranes are moist. Neck: No stridor.  No meningeal signs.   Cardiovascular: Mild tachycardia, regular rhythm. Good peripheral circulation. Grossly normal heart sounds. Respiratory: Normal respiratory effort.  No retractions. Lungs CTAB. Gastrointestinal: Soft but with severe tenderness to palpation throughout the abdomen and particularly at the site of the peritoneal catheter. Musculoskeletal: No lower extremity tenderness nor edema. No gross deformities of extremities. Neurologic:  Normal speech and language. No gross focal neurologic deficits are appreciated.  Skin:  Skin is warm, dry and intact. No rash noted. Psychiatric: Mood and affect are normal. Speech and behavior are  normal.  ____________________________________________   LABS (all labs ordered are listed, but only abnormal results are displayed)  Labs Reviewed  COMPREHENSIVE METABOLIC PANEL - Abnormal; Notable for the following components:      Result Value   Potassium 3.2 (*)    Chloride 97 (*)    BUN 65 (*)    Creatinine, Ser 15.27 (*)    Calcium 8.0 (*)    Total Protein 6.4 (*)    Albumin 3.2 (*)    GFR calc non Af Amer 4 (*)    GFR calc Af Amer 4 (*)    All other components within normal limits  CBC WITH DIFFERENTIAL/PLATELET - Abnormal; Notable for the following components:   WBC 19.6 (*)    RBC 2.49 (*)    Hemoglobin 7.7 (*)    HCT 24.9 (*)    RDW 17.4 (*)    Neutro Abs 15.7 (*)    Monocytes Absolute 1.3 (*)    Abs Immature Granulocytes 0.86 (*)    All other components within normal limits  LIPASE, BLOOD - Abnormal; Notable for the following components:   Lipase 68 (*)    All other components within normal limits  BRAIN NATRIURETIC PEPTIDE - Abnormal; Notable for the following components:   B Natriuretic Peptide 147.0 (*)    All other components within normal limits  TROPONIN I - Abnormal; Notable for the following components:   Troponin I 0.03 (*)    All other components within normal limits  BODY FLUID CULTURE  CULTURE, BLOOD (ROUTINE X 2)  CULTURE, BLOOD (ROUTINE X 2)  URINE CULTURE  LACTIC ACID, PLASMA  INFLUENZA PANEL BY PCR (TYPE A & B)  PROCALCITONIN  PROTIME-INR  URINALYSIS, COMPLETE (UACMP) WITH MICROSCOPIC  BODY FLUID CELL COUNT WITH DIFFERENTIAL  PATHOLOGIST SMEAR REVIEW   ____________________________________________  EKG  ED ECG REPORT I, Hinda Kehr, the attending physician, personally viewed and interpreted this ECG.  Date: 06/25/2018 EKG Time: 5:11 AM Rate: 92 Rhythm: normal sinus rhythm QRS Axis: Right axis deviation Intervals: normal ST/T Wave abnormalities: normal Narrative Interpretation: no evidence of acute  ischemia  ____________________________________________  RADIOLOGY I, Hinda Kehr, personally viewed and evaluated these images (plain radiographs) as part of my medical decision making, as well as reviewing the written report by the radiologist.  ED MD interpretation:   No obvious sign of pneumonia on chest x-rays.  CT abdomen/pelvis demonstrates the peritoneal dialysis catheter with some associated pneumoperitoneum.  Pneumonia in both the left lung and the right upper lobe concerning for healthcare associated multifocal pneumonia.  Official radiology report(s): Ct Abdomen Pelvis  Wo Contrast  Result Date: 06/25/2018 CLINICAL DATA:  Abdominal pain beginning tonight. History of peritoneal dialysis, with abdominal surgery 1 week ago. History kidney infection and appendectomy. EXAM: CT ABDOMEN AND PELVIS WITHOUT CONTRAST TECHNIQUE: Multidetector CT imaging of the abdomen and pelvis was performed following the standard protocol without IV contrast. COMPARISON:  CT abdomen and pelvis May 29, 2018. FINDINGS: LOWER CHEST: Persistent consolidation LEFT lower lobe, resolution of pleural effusion. Included heart is mildly enlarged. Minimal RIGHT lung base atelectasis. HEPATOBILIARY: Normal. PANCREAS: Normal. SPLEEN: Normal. ADRENALS/URINARY TRACT: Kidneys are orthotopic, demonstrating normal size and morphology. No nephrolithiasis, hydronephrosis; limited assessment for renal masses by nonenhanced CT. The unopacified ureters are normal in course and caliber. Urinary bladder is partially distended and unremarkable. Normal adrenal glands. STOMACH/BOWEL: The stomach, small and large bowel are normal in course and caliber without inflammatory changes, sensitivity decreased by lack of enteric contrast. Moderate volume retained large bowel stool. Mild colonic diverticulosis. VASCULAR/LYMPHATIC: Aortoiliac vessels are normal in course and caliber. No lymphadenopathy by CT size criteria. REPRODUCTIVE: Normal. OTHER:  Intraperitoneal dialysis catheter distal tip in stomach. Small volume pneumoperitoneum and ascites. MUSCULOSKELETAL: Non-acute.  Small fat containing umbilical hernia. IMPRESSION: 1. Peritoneal dialysis and cited a small volume pneumoperitoneum and ascites. 2. Moderate volume retained large bowel stool without bowel obstruction. Mild colonic diverticulosis. 3. LEFT lower lobe atelectasis/scarring, less likely residual/recurrent pneumonia. Electronically Signed   By: Elon Alas M.D.   On: 06/25/2018 04:39   Dg Chest 2 View  Result Date: 06/25/2018 CLINICAL DATA:  Hypoxia EXAM: CHEST - 2 VIEW COMPARISON:  05/23/2018 FINDINGS: Patchy and streaky bilateral airspace opacity greatest on the right. Normal heart size. No edema, effusion, or pneumothorax. Pneumoperitoneum correlating with peritoneal dialysis catheter. IMPRESSION: Patchy bilateral pneumonia. Electronically Signed   By: Monte Fantasia M.D.   On: 06/25/2018 04:37   Ct Chest Wo Contrast  Result Date: 06/25/2018 CLINICAL DATA:  Recent healthcare pneumonia. Sepsis. Evaluate for recurrent pneumonia EXAM: CT CHEST WITHOUT CONTRAST TECHNIQUE: Multidetector CT imaging of the chest was performed following the standard protocol without IV contrast. COMPARISON:  None. FINDINGS: Cardiovascular: Normal heart size.  No pericardial effusion Mediastinum/Nodes: Negative for adenopathy or mass. Lungs/Pleura: Multifocal airspace disease with reticulonodular appearance in the left lung and segmental consolidation in the right upper lobe. Mild superimposed atelectasis greatest in the left lower lobe. No cavitation or effusion. There is airway thickening and mild centrilobular emphysema. Upper Abdomen: Pneumoperitoneum. Patient is on peritoneal dialysis. There was preceding abdominal CT earlier today. Musculoskeletal: No acute or aggressive finding. IMPRESSION: 1. Multifocal pneumonia with segmental consolidation in the right upper lobe. 2. Mild emphysema.  Electronically Signed   By: Monte Fantasia M.D.   On: 06/25/2018 06:05    ____________________________________________   PROCEDURES  Critical Care performed: Yes, see critical care procedure note(s)   Procedure(s) performed:   .Critical Care Performed by: Hinda Kehr, MD Authorized by: Hinda Kehr, MD   Critical care provider statement:    Critical care time (minutes):  45   Critical care time was exclusive of:  Separately billable procedures and treating other patients   Critical care was necessary to treat or prevent imminent or life-threatening deterioration of the following conditions:  Sepsis   Critical care was time spent personally by me on the following activities:  Development of treatment plan with patient or surrogate, discussions with consultants, evaluation of patient's response to treatment, examination of patient, obtaining history from patient or surrogate, ordering and performing treatments and interventions, ordering and  review of laboratory studies, ordering and review of radiographic studies, pulse oximetry, re-evaluation of patient's condition and review of old charts     ____________________________________________   INITIAL IMPRESSION / ASSESSMENT AND PLAN / ED COURSE  As part of my medical decision making, I reviewed the following data within the [place] notes reviewed and incorporated, Labs reviewed , EKG interpreted , Old chart reviewed, Radiograph reviewed , Discussed with admitting physician , Discussed with nephrologist and Notes from prior ED visits    Differential diagnosis includes, but is not limited to, spontaneous bacterial peritonitis, postoperative complication/infection, pneumonia, influenza, or other acute intra-abdominal or intrathoracic infection.  The patient is ill-appearing although nontoxic.  His initial vitals were generally reassuring with a normal blood pressure and only slightly elevated temperature,  but his second set of vitals about an hour later demonstrated some mild hypotension and a fever of 100.9.  Given the tenderness to palpation, the recent surgery, the recent healthcare associated pneumonia, etc., I am making the patient code sepsis.  I have ordered ceftriaxone 2 g IV as per order set recommendations for spontaneous bacterial peritonitis.  We have been attempting to find the dialysis nurse so that a peritoneal dialysate sample can be drawn as per hospital protocol, but thus far the ED charge nurse has been unable to get in touch with anyone in spite of using the dialysis nurse pager.  I have paged Dr. Juleen China from the nephrology service to discuss.  I have ordered 30 mL/kg of IV fluids for the patient given the possibility of sepsis.  Some of his labs have come back and he has a white blood cell count of 19.6 which is significantly higher than the last white blood cell count in the hospital of 10.  His comprehensive metabolic panel is nonspecific.  Lactic acid was 0.8.  Procalcitonin and the remainder of the septic work-up is pending  Dr. Juleen China was paged at approximately 5:05am and we will continue trying to reach him as well as the dialysis nurse.  Clinical Course as of Jun 25 636  Wed Jun 25, 2018  0512 CT abdomen/pelvis without contrast demonstrates the peritoneal dialysis catheter with some associated pneumoperitoneum but it is unclear if this is anticipated postoperative changes or if this could be evidence of a new infection.  I am inclined to think that this is anticipated in the relatively recent postoperative period but I will also discuss this with Dr. Juleen China.   [CF]  [phone] Chest x-ray is nondiagnostic and in the setting of a brief period of hypoxemia to 89% on room air for which she is now on oxygen 2 L by nasal cannula and his recent diagnosis of healthcare associated pneumonia, I will obtain a CT chest without contrast for further characterization and determination if he has  recurrent pneumonia.   [CF]  [phone] Negative influenza panel  Influenza panel by PCR (type A & B) [CF]  0554 Troponin I(!!): 0.03 [CF]  0554 Lipase(!): 68 [CF]  0554 Procalcitonin: 1.65 [CF]  0559 The ED charge nurse was informed by the dialysis nurse manager that no one is available to obtain the sample at this time and that the sample will be obtained in the morning when the dayshift comes in.  I have not yet heard back from Dr. Juleen China and awaiting a callback from the hospitalist, Dr. Darvin Neighbours.   [CF]  0603 I discussed the case in detail by phone with Dr. Darvin Neighbours who will admit.  He  remembers the patient because he took care of him during the prior admission.  I also explained that   [CF]  0609 CT chest indicates multifocal pneumonia which appears acute according to the radiologist's impression.  I am broadening antibiotic coverage.  CT Chest Wo Contrast [CF]  0609 Dr. Juleen China called back and I went over the case in detail with him as well.  He agreed with my management thus far and understands the need for the delay in the fluid specimen retrieval since no dialysis nurse is available to get the specimen.  He agreed with everything that has been done thus far and agreed with me that the slight pneumoperitoneum is likely secondary to the recent surgery.   [CF]    Clinical Course User Index [CF] Hinda Kehr, MD    ____________________________________________  FINAL CLINICAL IMPRESSION(S) / ED DIAGNOSES  Final diagnoses:  SBP (spontaneous bacterial peritonitis) ([place])  Generalized abdominal pain  Elevated troponin I level  Hypoxemia  Sepsis, due to unspecified organism, unspecified whether acute organ dysfunction present ([place])  Healthcare-associated pneumonia     MEDICATIONS GIVEN DURING THIS VISIT:  Medications  cefTRIAXone (ROCEPHIN) 2 g in sodium chloride 0.9 % 100 mL IVPB (0 g Intravenous Stopped 06/25/18 0605)  heparin injection 5,000 Units (has no administration in time range)    acetaminophen (TYLENOL) tablet 650 mg (has no administration in time range)    Or  acetaminophen (TYLENOL) suppository 650 mg (has no administration in time range)  docusate sodium (COLACE) capsule 100 mg (has no administration in time range)  senna-docusate (Senokot-S) tablet 1 tablet (has no administration in time range)  ondansetron (ZOFRAN) tablet 4 mg (has no administration in time range)    Or  ondansetron (ZOFRAN) injection 4 mg (has no administration in time range)  albuterol (PROVENTIL) (2.5 MG/3ML) 0.083% nebulizer solution 2.5 mg (has no administration in time range)  morphine 2 MG/ML injection 2 mg (has no administration in time range)  polyethylene glycol (MIRALAX / GLYCOLAX) packet 17 g (has no administration in time range)  levofloxacin (LEVAQUIN) IVPB 750 mg (750 mg Intravenous New Bag/Given 06/25/18 0636)  vancomycin (VANCOCIN) IVPB 1000 mg/200 mL premix (has no administration in time range)  vancomycin (VANCOCIN) IVPB 750 mg/150 ml premix (has no administration in time range)  sodium chloride 0.9 % bolus 1,000 mL (1,000 mLs Intravenous New Bag/Given 06/25/18 0513)    And  sodium chloride 0.9 % bolus 1,000 mL (1,000 mLs Intravenous New Bag/Given 06/25/18 0512)    And  sodium chloride 0.9 % bolus 250 mL (0 mLs Intravenous Stopped 06/25/18 0524)  morphine 2 MG/ML injection 2 mg (2 mg Intravenous Given 06/25/18 0516)  ondansetron (ZOFRAN) injection 4 mg (4 mg Intravenous Given 06/25/18 0516)     ED Discharge Orders    None       Note:  This document was prepared using Dragon voice recognition software and may include unintentional dictation errors.    Hinda Kehr, MD 06/25/18 [place], [place], MD 06/25/18 [phone]

## 2018-06-25 NOTE — H&P (Signed)
[place] [facility] [place] NAME: Jeffrey Holloway    MR#:  [id number]  DATE OF BIRTH:  1985/07/29  DATE OF ADMISSION:  06/25/2018  PRIMARY CARE PHYSICIAN: Patient, No Pcp Per   REQUESTING/REFERRING PHYSICIAN: Dr. Karma Greaser  CHIEF COMPLAINT:   Chief Complaint  Patient presents with  . Abdominal Pain    HISTORY OF PRESENT ILLNESS:  Jeffrey Holloway  is a 33 y.o. male with a known history of end-stage renal disease, hypertension, peritoneal dialysis with recent admission for pneumonia presents to the emergency room complaining of increasing pain at his peritoneal dialysis catheter site.  Patient has had issues with significant abdominal pain with his peritoneal dialysis catheter.  This was thought to be due to malposition and he followed up with his surgeon at [facility] and had laparoscopically taken care of.  After this he did not have any pain.  Started having increasing pain in his abdomen since yesterday.  No purulent discharge.  Was able to carry on with sepsis peritoneal dialysis.  Here in the emergency room he has been found to have significantly elevated WBC count, tachycardia, hypotension.  Patient had a CT scan of the abdomen and pelvis which showed nothing acute other than pneumoperitoneum which is expected with his peritoneal dialysis.  CT of the chest shows bilateral pneumonia.  He is also found to be hypoxic with oxygen levels at 89% on room air.  Saturations 96% on 2 L oxygen.  Although he does not complain of any shortness of breath or cough.  PAST MEDICAL HISTORY:   Past Medical History:  Diagnosis Date  . Constipation   . Diarrhea   . Glomerulonephritis   . Hemorrhoids   . Kidney infection   . Nausea and vomiting   . Occult blood in stools   . Shortness of breath   . Sleep apnea     PAST SURGICAL HISTORY:   Past Surgical History:  Procedure Laterality Date  . APPENDECTOMY    . DIALYSIS/PERMA CATHETER INSERTION N/A  10/15/2017   Procedure: DIALYSIS/PERMA CATHETER INSERTION;  Surgeon: Katha Cabal, MD;  Location: [facility];  Service: Cardiovascular;  Laterality: N/A;  . DIALYSIS/PERMA CATHETER REMOVAL N/A 05/12/2018   Procedure: DIALYSIS/PERMA CATHETER REMOVAL;  Surgeon: Algernon Huxley, MD;  Location: [facility];  Service: Cardiovascular;  Laterality: N/A;    SOCIAL HISTORY:   Social History   Tobacco Use  . Smoking status: Current Every Day Smoker    Packs/day: 0.25    Types: Cigarettes  . Smokeless tobacco: Never Used  Substance Use Topics  . Alcohol use: Yes    FAMILY HISTORY:   Family History  Problem Relation Age of Onset  . Varicose Veins Neg Hx   . Vision loss Neg Hx   . Stroke Neg Hx     DRUG ALLERGIES:   Allergies  Allergen Reactions  . Acetaminophen Nausea And Vomiting    REVIEW OF SYSTEMS:   Review of Systems  Constitutional: Positive for chills and malaise/fatigue. Negative for fever and weight loss.  HENT: Negative for hearing loss and nosebleeds.   Eyes: Negative for blurred vision, double vision and pain.  Respiratory: Negative for cough, hemoptysis, sputum production, shortness of breath and wheezing.   Cardiovascular: Negative for chest pain, palpitations, orthopnea and leg swelling.  Gastrointestinal: Positive for abdominal pain. Negative for constipation, diarrhea, nausea and vomiting.  Genitourinary: Negative for dysuria and hematuria.  Musculoskeletal: Negative for back pain,  falls and myalgias.  Skin: Negative for rash.  Neurological: Negative for dizziness, tremors, sensory change, speech change, focal weakness, seizures and headaches.  Endo/Heme/Allergies: Does not bruise/bleed easily.  Psychiatric/Behavioral: Negative for depression and memory loss. The patient is not nervous/anxious.     MEDICATIONS AT HOME:   Prior to Admission medications   Medication Sig Start Date End Date Taking? Authorizing Provider  ALPRAZolam Duanne Moron)  0.25 MG tablet Take 1 tablet by mouth 2 (two) times daily. 05/02/18  Yes [provider]  amLODipine (NORVASC) 10 MG tablet Take 1 tablet (10 mg total) by mouth daily. 10/18/17  Yes Epifanio Lesches, MD  calcium acetate (PHOSLO) 667 MG capsule Take 2 capsules (1,334 mg total) by mouth 3 (three) times daily with meals. 10/18/17  Yes Epifanio Lesches, MD  calcium carbonate (TUMS - DOSED IN MG ELEMENTAL CALCIUM) 500 MG chewable tablet Chew 1 tablet by mouth 2 (two) times daily.   Yes [provider]  carvedilol (COREG) 6.25 MG tablet Take 1 tablet by mouth 2 (two) times daily. 04/05/18  Yes [provider]  cloNIDine (CATAPRES - DOSED IN MG/24 HR) 0.2 mg/24hr patch Place 1 patch onto the skin once a week. 06/01/18  Yes [provider]  cloNIDine (CATAPRES) 0.1 MG tablet Take 1 tablet by mouth 2 (two) times daily. 05/10/18  Yes [provider]  furosemide (LASIX) 80 MG tablet Take 1 tablet by mouth daily. 04/29/18  Yes [provider]  hydrALAZINE (APRESOLINE) 50 MG tablet Take 1 tablet by mouth 3 (three) times daily. 04/05/18  Yes [provider]  isosorbide mononitrate (IMDUR) 30 MG 24 hr tablet Take 1 tablet (30 mg total) by mouth daily. 04/04/18  Yes Hillary Bow, MD  losartan (COZAAR) 100 MG tablet Take 1 tablet (100 mg total) by mouth daily. 10/18/17  Yes Epifanio Lesches, MD  metoprolol tartrate (LOPRESSOR) 50 MG tablet Take 1 tablet (50 mg total) by mouth 2 (two) times daily. 04/04/18  Yes Gladstone Lighter, MD  minoxidil (LONITEN) 10 MG tablet Take 10 mg by mouth 2 (two) times daily.    Yes [provider]  multivitamin (RENA-VIT) TABS tablet Take 1 tablet by mouth at bedtime. 10/18/17  Yes Epifanio Lesches, MD  Nutritional Supplements (FEEDING SUPPLEMENT, NEPRO CARB STEADY,) LIQD Take 237 mLs by mouth 2 (two) times daily between meals. 10/18/17  Yes Epifanio Lesches, MD  omeprazole (PRILOSEC) 20 MG capsule Take  20 mg by mouth daily.   Yes [provider]  oxyCODONE (ROXICODONE) 5 MG immediate release tablet Take 1 tablet (5 mg total) by mouth every 6 (six) hours as needed for severe pain. 06/01/18 06/01/19 Yes Aimar Borghi, Alveta Heimlich, MD  predniSONE (DELTASONE) 20 MG tablet Take 1.5 tablets (30 mg total) by mouth daily with breakfast. Patient taking differently: Take 20 mg by mouth daily with breakfast.  10/19/17  Yes Epifanio Lesches, MD  rOPINIRole (REQUIP) 0.5 MG tablet Take 1 tablet by mouth at bedtime. 03/21/18  Yes [provider]     VITAL SIGNS:  Blood pressure (!) 75/47, pulse 88, temperature (!) 100.9 F (38.3 C), temperature source Oral, resp. rate 16, height 6' (1.829 m), weight 70.3 kg, SpO2 99 %.  PHYSICAL EXAMINATION:  Physical Exam  GENERAL:  33 y.o.-year-old patient lying in the bed with no acute distress.  EYES: Pupils equal, round, reactive to light and accommodation. No scleral icterus. Extraocular muscles intact.  HEENT: Head atraumatic, normocephalic. Oropharynx and nasopharynx clear. No oropharyngeal erythema, moist oral mucosa  NECK:  Supple, no jugular venous distention. No thyroid enlargement, no tenderness.  LUNGS: Normal breath sounds bilaterally, no wheezing, rales, rhonchi. No use of accessory muscles of respiration.  CARDIOVASCULAR: S1, S2 normal. No murmurs, rubs, or gallops.  ABDOMEN: Soft, nontender, nondistended. Bowel sounds present. No organomegaly or mass.  Peritoneal dialysis catheter site looks clean.  No discharge.  No redness. EXTREMITIES: No pedal edema, cyanosis, or clubbing. + 2 pedal & radial pulses b/l.   NEUROLOGIC: Cranial nerves II through XII are intact. No focal Motor or sensory deficits appreciated b/l PSYCHIATRIC: The patient is alert and oriented x 3. Good affect.  SKIN: No obvious rash, lesion, or ulcer.   LABORATORY PANEL:   CBC Recent Labs  Lab 06/25/18 0322  WBC 19.6*  HGB 7.7*  HCT 24.9*  PLT 274    ------------------------------------------------------------------------------------------------------------------  Chemistries  Recent Labs  Lab 06/25/18 0322  NA 139  K 3.2*  CL 97*  CO2 27  GLUCOSE 92  BUN 65*  CREATININE 15.27*  CALCIUM 8.0*  AST 20  ALT <5  ALKPHOS 54  BILITOT 0.6   ------------------------------------------------------------------------------------------------------------------  Cardiac Enzymes Recent Labs  Lab 06/25/18 0322  TROPONINI 0.03*   ------------------------------------------------------------------------------------------------------------------  RADIOLOGY:  Ct Abdomen Pelvis Wo Contrast  Result Date: 06/25/2018 CLINICAL DATA:  Abdominal pain beginning tonight. History of peritoneal dialysis, with abdominal surgery 1 week ago. History kidney infection and appendectomy. EXAM: CT ABDOMEN AND PELVIS WITHOUT CONTRAST TECHNIQUE: Multidetector CT imaging of the abdomen and pelvis was performed following the standard protocol without IV contrast. COMPARISON:  CT abdomen and pelvis May 29, 2018. FINDINGS: LOWER CHEST: Persistent consolidation LEFT lower lobe, resolution of pleural effusion. Included heart is mildly enlarged. Minimal RIGHT lung base atelectasis. HEPATOBILIARY: Normal. PANCREAS: Normal. SPLEEN: Normal. ADRENALS/URINARY TRACT: Kidneys are orthotopic, demonstrating normal size and morphology. No nephrolithiasis, hydronephrosis; limited assessment for renal masses by nonenhanced CT. The unopacified ureters are normal in course and caliber. Urinary bladder is partially distended and unremarkable. Normal adrenal glands. STOMACH/BOWEL: The stomach, small and large bowel are normal in course and caliber without inflammatory changes, sensitivity decreased by lack of enteric contrast. Moderate volume retained large bowel stool. Mild colonic diverticulosis. VASCULAR/LYMPHATIC: Aortoiliac vessels are normal in course and caliber. No  lymphadenopathy by CT size criteria. REPRODUCTIVE: Normal. OTHER: Intraperitoneal dialysis catheter distal tip in stomach. Small volume pneumoperitoneum and ascites. MUSCULOSKELETAL: Non-acute.  Small fat containing umbilical hernia. IMPRESSION: 1. Peritoneal dialysis and cited a small volume pneumoperitoneum and ascites. 2. Moderate volume retained large bowel stool without bowel obstruction. Mild colonic diverticulosis. 3. LEFT lower lobe atelectasis/scarring, less likely residual/recurrent pneumonia. Electronically Signed   By: Elon Alas M.D.   On: 06/25/2018 04:39   Dg Chest 2 View  Result Date: 06/25/2018 CLINICAL DATA:  Hypoxia EXAM: CHEST - 2 VIEW COMPARISON:  05/23/2018 FINDINGS: Patchy and streaky bilateral airspace opacity greatest on the right. Normal heart size. No edema, effusion, or pneumothorax. Pneumoperitoneum correlating with peritoneal dialysis catheter. IMPRESSION: Patchy bilateral pneumonia. Electronically Signed   By: Monte Fantasia M.D.   On: 06/25/2018 04:37   Ct Chest Wo Contrast  Result Date: 06/25/2018 CLINICAL DATA:  Recent healthcare pneumonia. Sepsis. Evaluate for recurrent pneumonia EXAM: CT CHEST WITHOUT CONTRAST TECHNIQUE: Multidetector CT imaging of the chest was performed following the standard protocol without IV contrast. COMPARISON:  None. FINDINGS: Cardiovascular: Normal heart size.  No pericardial effusion Mediastinum/Nodes: Negative for adenopathy or mass. Lungs/Pleura: Multifocal airspace disease with reticulonodular appearance in the left lung and segmental consolidation in  the right upper lobe. Mild superimposed atelectasis greatest in the left lower lobe. No cavitation or effusion. There is airway thickening and mild centrilobular emphysema. Upper Abdomen: Pneumoperitoneum. Patient is on peritoneal dialysis. There was preceding abdominal CT earlier today. Musculoskeletal: No acute or aggressive finding. IMPRESSION: 1. Multifocal pneumonia with segmental  consolidation in the right upper lobe. 2. Mild emphysema. Electronically Signed   By: Monte Fantasia M.D.   On: 06/25/2018 06:05     IMPRESSION AND PLAN:   *Sepsis with hypotension.  Sources of infection at this point are bacterial peritonitis and pneumonia.  Patient will be admitted to stepdown.  Sepsis protocol fluid resuscitation. Hold blood pressure medications.  Discussed with [facility] Oletta Darter of [facility] prior to stepdown transfer.  *Bacterial peritonitis.  Will start IV antibiotics.  Case has been discussed with nephrology Dr. Juleen China regarding obtaining fluid from peritoneal dialysis catheter.  Labs ordered  *Bilateral healthcare acquired pneumonia broad-spectrum IV antibiotics.  Blood cultures.  Sputum cultures.  Acute hypoxic respiratory failure.  Continue oxygen.  Wean as tolerated.  Nebulizers as needed  *Hypertension.  Hold blood pressure medications due to hypotension.  *End-stage renal disease on peritoneal dialysis.  Nephrology has been consulted.  Continue dialysis as per nephrology recommendations  DVT prophylaxis with heparin subcutaneous  All the records are reviewed and case discussed with ED provider. Management plans discussed with the patient, family and they are in agreement.  CODE STATUS: Full code  TOTAL CRITICAL CARE TIME TAKING CARE OF THIS PATIENT: 40 minutes.   Neita Carp M.D on 06/25/2018 at 6:15 AM  Between 7am to 6pm - Pager - [phone]  After 6pm go to [email or web address] - password [email or web address] [facility]  Office  [phone]  CC: Primary care physician; Patient, No Pcp Per  Note: This dictation was prepared with Dragon dictation along with smaller phrase technology. Any transcriptional errors that result from this process are unintentional.

## 2018-06-26 ENCOUNTER — Other Ambulatory Visit: Payer: Self-pay

## 2018-06-26 DIAGNOSIS — J189 Pneumonia, unspecified organism: Secondary | ICD-10-CM

## 2018-06-26 LAB — BASIC METABOLIC PANEL
Anion gap: 14 (ref 5–15)
BUN: 68 mg/dL — ABNORMAL HIGH (ref 6–20)
CO2: 23 mmol/L (ref 22–32)
Calcium: 7.6 mg/dL — ABNORMAL LOW (ref 8.9–10.3)
Chloride: 101 mmol/L (ref 98–111)
Creatinine, Ser: 14.09 mg/dL — ABNORMAL HIGH (ref 0.61–1.24)
GFR calc Af Amer: 5 mL/min — ABNORMAL LOW (ref >60)
GFR calc non Af Amer: 4 mL/min — ABNORMAL LOW (ref >60)
Glucose, Bld: 125 mg/dL — ABNORMAL HIGH (ref 70–99)
Potassium: 3.9 mmol/L (ref 3.5–5.1)
SODIUM: 138 mmol/L (ref 135–145)

## 2018-06-26 LAB — CBC
HCT: 25.9 % — ABNORMAL LOW (ref 39.0–52.0)
Hemoglobin: 8.1 g/dL — ABNORMAL LOW (ref 13.0–17.0)
MCH: 30.8 pg (ref 26.0–34.0)
MCHC: 31.3 g/dL (ref 30.0–36.0)
MCV: 98.5 fL (ref 80.0–100.0)
Platelets: 282 10*3/uL (ref 150–400)
RBC: 2.63 MIL/uL — ABNORMAL LOW (ref 4.22–5.81)
RDW: 16.4 % — ABNORMAL HIGH (ref 11.5–15.5)
WBC: 19.2 10*3/uL — AB (ref 4.0–10.5)
nRBC: 0 % (ref 0.0–0.2)

## 2018-06-26 LAB — VANCOMYCIN, RANDOM: Vancomycin Rm: 26

## 2018-06-26 MED ORDER — HYDROCORTISONE NA SUCCINATE PF 100 MG IJ SOLR
50.0000 mg | Freq: Two times a day (BID) | INTRAMUSCULAR | Status: DC
  Administered 2018-06-26 – 2018-06-27 (×2): 50 mg via INTRAVENOUS
  Filled 2018-06-26 (×2): qty 2

## 2018-06-26 NOTE — Progress Notes (Addendum)
CRITICAL CARE NOTE  CC  Follow up HYPOtension  SIGNIFICANT EVENTS/STUDIES 1/22 - Arrived at ED, obtained Chest X-ray, CT Chest and Abd/pelvis w/o contrast 1/22 - Elevated WBC 19.6  1/22 - Transferred to ICU Stepdown for Hypotension  1/22 - Received peritoneal dialysis in PM, tolerated well 1/23 - Off IV fluid, BP improved, MAP at 70  SUBJECTIVE Patient is awake and alert, reports tolerated peritoneal dialysis well last night. He reports pain has decreased today, 6/10. No complaints this AM, breathing normal on room air. MAP at 70. Wife at bedside.   BP 124/76   Pulse 83   Temp 98.6 F (37 C) (Oral)   Resp 15   Ht 6' (1.829 m)   Wt 76.5 kg   SpO2 98%   BMI 22.87 kg/m    REVIEW OF SYSTEMS Review of Systems  Constitutional: Negative for chills and fever.  Respiratory: Negative for cough and wheezing.   Cardiovascular: Negative for chest pain and palpitations.  Gastrointestinal: Positive for abdominal pain.  Neurological: Negative for dizziness and headaches.  All other systems reviewed and are negative.     PHYSICAL EXAMINATION:  GENERAL: In no acute distress HEAD: Normocephalic, atraumatic.  EYES: Pupils equal, round, reactive to light.  No scleral icterus.  MOUTH: Moist mucosal membrane. NECK: Supple. No thyromegaly. No nodules. No JVD.  PULMONARY: Clear lung sound bilaterally CARDIOVASCULAR: S1 and S2. Regular rate and rhythm. No murmurs, rubs, or gallops.  GASTROINTESTINAL: Soft, nontender, -distended. No masses. Positive bowel sounds. No hepatosplenomegaly.  MUSCULOSKELETAL: No swelling, clubbing, or edema.  NEUROLOGIC: Alert and awake SKIN: Intact,warm,dry   ASSESSMENT AND PLAN  Patient is a 33 y.o. male presents to ICU Stepdown for HYPOtension with unspecified peritonitis and PNA.   HYPOtension secondary to sepsis from peritonitis and PNA -Currently not on IV fluid. BP improved, MAP 70  ESRD on peritoneal dialysis -Received peritoneal dialysis last  night, tolerated well -Follow nephrology recommendation  Abdominal pain on peritoneal catheter site -Pain management: morphine PRN   Pt is FULL CODE, Stepdown status. Ready to be transferred to [place], PA-Student  PCCM ATTENDING ATTESTATION: I have evaluated patient with the PA student, reviewed database in its entirety and discussed care plan in detail. In addition, this patient was discussed on multidisciplinary rounds. I have personally reviewed all chest radiographs discussed herein including CXRs and CT chest unless otherwise indicated  He is now stable for transfer to [place] floor and PCCM will sign off  Merton Border, MD PCCM service Mobile [phone] Pager [phone] 06/26/2018 3:50 PM

## 2018-06-26 NOTE — Progress Notes (Signed)
[place] at [place] NAME: Jeffrey Holloway    MR#:  [id number]  DATE OF BIRTH:  1986-04-19  SUBJECTIVE:   Feeling much better this morning.  Patient states that his belly pain has gotten much better.  He denies any fevers or chills. Tolerated PD last night.  REVIEW OF SYSTEMS:  Review of Systems  Constitutional: Negative for chills and fever.  HENT: Negative for congestion and sore throat.   Eyes: Negative for blurred vision and double vision.  Respiratory: Negative for cough and shortness of breath.   Cardiovascular: Negative for chest pain, palpitations and leg swelling.  Gastrointestinal: Positive for abdominal pain. Negative for nausea and vomiting.  Genitourinary: Negative for flank pain.  Musculoskeletal: Negative for back pain and neck pain.  Neurological: Negative for dizziness and headaches.  Psychiatric/Behavioral: Negative for depression. The patient is not nervous/anxious.     DRUG ALLERGIES:   Allergies  Allergen Reactions  . Acetaminophen Nausea And Vomiting   VITALS:  Blood pressure 106/63, pulse 82, temperature 98.6 F (37 C), temperature source Oral, resp. rate 15, height 6' (1.829 m), weight 76.5 kg, SpO2 97 %. PHYSICAL EXAMINATION:  Physical Exam  GENERAL:  33 y.o.-year-old patient lying in the bed with no acute distress.  EYES: Pupils equal, round, reactive to light and accommodation. No scleral icterus. Extraocular muscles intact.  HEENT: Head atraumatic, normocephalic. Oropharynx and nasopharynx clear. No oropharyngeal erythema, moist oral mucosa  NECK:  Supple, no jugular venous distention. No thyroid enlargement, no tenderness.  LUNGS: Normal breath sounds bilaterally, no wheezing, rales, rhonchi. No use of accessory muscles of respiration.  CARDIOVASCULAR: RRR, S1, S2 normal. No murmurs, rubs, or gallops.  ABDOMEN: Soft, nontender, nondistended. Bowel sounds present. No organomegaly or mass. +mild erythema  surrounding PD catheter site. EXTREMITIES: No pedal edema, cyanosis, or clubbing. NEUROLOGIC: Cranial nerves II through XII are intact. No focal Motor or sensory deficits appreciated b/l PSYCHIATRIC: The patient is alert and oriented x 3.  SKIN: No obvious rash, lesion, or ulcer.  LABORATORY PANEL:  Male CBC Recent Labs  Lab 06/26/18 0427  WBC 19.2*  HGB 8.1*  HCT 25.9*  PLT 282   ------------------------------------------------------------------------------------------------------------------ Chemistries  Recent Labs  Lab 06/25/18 0322 06/26/18 0427  NA 139 138  K 3.2* 3.9  CL 97* 101  CO2 27 23  GLUCOSE 92 125*  BUN 65* 68*  CREATININE 15.27* 14.09*  CALCIUM 8.0* 7.6*  AST 20  --   ALT <5  --   ALKPHOS 54  --   BILITOT 0.6  --    RADIOLOGY:  No results found. ASSESSMENT AND PLAN:   Sepsis- secondary to HCAP versus PD catheter infection.  Sepsis resolving.  Afebrile, but with persistent leukocytosis.  -Continue ceftriaxone, vancomycin stopped today -Blood cultures with no growth to date -Peritoneal fluid culture pending  PD catheter site infection- clinically improving -Continue ceftriaxone, vancomycin stopped today -Vascular following- patient does not want peritoneal catheter removed at this time  HCAP- improving, stable on room air.  CT chest with multifocal pneumonia.  -Continue ceftriaxone -Continue broad-spectrum antibiotics  Hypertension- BPs improving -Holding home BP meds  ESRD on PD -Nephrology following  DVT prophylaxis with heparin subcutaneous  All the records are reviewed and case discussed with Care Management/Social Worker. Management plans discussed with the patient, family and they are in agreement.  CODE STATUS: Full Code  TOTAL TIME TAKING CARE OF THIS PATIENT: 40 minutes.   More than 50% of  the time was spent in counseling/coordination of care: YES  POSSIBLE D/C IN 2-3 DAYS, DEPENDING ON CLINICAL CONDITION.   Berna Spare  Diane Hanel M.D on 06/26/2018 at 12:50 PM  Between 7am to 6pm - Pager - [phone]  After 6pm go to [email or web address] - Proofreader  [facility] [place] [facility]  Office  [phone]  CC: Primary care physician; Patient, No Pcp Per  Note: This dictation was prepared with Dragon dictation along with smaller phrase technology. Any transcriptional errors that result from this process are unintentional.

## 2018-06-26 NOTE — Progress Notes (Signed)
[facility]  ROUNDING NOTE   Subjective:   Wife at bedside.   Peritoneal dialysis last night. Tolerated treatment well. UF of 616mL.   Afebrile. Off vasopressors.   Objective:  Vital signs in last 24 hours:  Temp:  [98.2 F (36.8 C)-98.8 F (37.1 C)] 98.6 F (37 C) (01/23 0800) Pulse Rate:  [66-90] 83 (01/23 1000) Resp:  [8-21] 15 (01/23 1000) BP: (67-132)/(35-84) 124/76 (01/23 1000) SpO2:  [86 %-99 %] 98 % (01/23 1000) Weight:  [76.5 kg] 76.5 kg (01/23 0500)  Weight change: 3.392 kg Filed Weights   06/25/18 0300 06/25/18 0829 06/26/18 0500  Weight: 70.3 kg 73.7 kg 76.5 kg    Intake/Output: I/O last 3 completed shifts: In: 2896.9 [P.O.:240; IV Piggyback:2656.9] Out: 688 [Other:688]   Intake/Output this shift:  No intake/output data recorded.  Physical Exam: General: NAD,   Head: Normocephalic, atraumatic. Moist oral mucosal membranes  Eyes: Anicteric, PERRL  Neck: Supple, trachea midline  Lungs:  Clear to auscultation  Heart: Regular rate and rhythm  Abdomen:  +tender  Extremities:  no peripheral edema.  Neurologic: Nonfocal, moving all four extremities  Skin: No lesions  Access: PD catheter - tender at exit site    Basic Metabolic Panel: Recent Labs  Lab 06/25/18 0322 06/26/18 0427  NA 139 138  K 3.2* 3.9  CL 97* 101  CO2 27 23  GLUCOSE 92 125*  BUN 65* 68*  CREATININE 15.27* 14.09*  CALCIUM 8.0* 7.6*    Liver Function Tests: Recent Labs  Lab 06/25/18 0322  AST 20  ALT <5  ALKPHOS 54  BILITOT 0.6  PROT 6.4*  ALBUMIN 3.2*   Recent Labs  Lab 06/25/18 0322  LIPASE 68*   No results for input(s): AMMONIA in the last 168 hours.  CBC: Recent Labs  Lab 06/25/18 0322 06/26/18 0427  WBC 19.6* 19.2*  NEUTROABS 15.7*  --   HGB 7.7* 8.1*  HCT 24.9* 25.9*  MCV 100.0 98.5  PLT 274 282    Cardiac Enzymes: Recent Labs  Lab 06/25/18 0322  TROPONINI 0.03*    BNP: Invalid input(s): POCBNP  CBG: Recent Labs  Lab  06/25/18 0830  GLUCAP 51    Microbiology: Results for orders placed or performed during the hospital encounter of 06/25/18  Blood Culture (routine x 2)     Status: None (Preliminary result)   Collection Time: 06/25/18  3:22 AM  Result Value Ref Range Status   Specimen Description BLOOD RIGHT AC  Final   Special Requests   Final    BOTTLES DRAWN AEROBIC AND ANAEROBIC Blood Culture results may not be optimal due to an excessive volume of blood received in culture bottles   Culture   Final    NO GROWTH < 24 HOURS Performed at [facility], [address] [place], [place] [address]    Report Status PENDING  Incomplete  Blood Culture (routine x 2)     Status: None (Preliminary result)   Collection Time: 06/25/18  5:00 AM  Result Value Ref Range Status   Specimen Description BLOOD LEFT WRIST  Final   Special Requests   Final    BOTTLES DRAWN AEROBIC AND ANAEROBIC Blood Culture adequate volume   Culture   Final    NO GROWTH < 24 HOURS Performed at [facility], [address] [place], [place] [address]    Report Status PENDING  Incomplete  MRSA PCR Screening     Status: None   Collection Time: 06/25/18  8:32 AM  Result Value Ref Range Status   MRSA by PCR NEGATIVE NEGATIVE Final    Comment:        The GeneXpert MRSA Assay (FDA approved for NASAL specimens only), is one component of a comprehensive MRSA colonization surveillance program. It is not intended to diagnose MRSA infection nor to guide or monitor treatment for MRSA infections. Performed at [facility], [address] [place], [place] [address]   Body fluid culture     Status: None (Preliminary result)   Collection Time: 06/25/18 11:16 AM  Result Value Ref Range Status   Specimen Description   Final    PERITONEAL DIALYSATE Performed at [facility], [address], [place], [place] [address]    Special Requests   Final    Normal Performed at [facility], [address] [place], [place] [address]    Gram Stain   Final    WBC PRESENT, PREDOMINANTLY MONONUCLEAR NO ORGANISMS SEEN CYTOSPIN SMEAR    Culture   Final    NO GROWTH < 24 HOURS Performed at [facility], [place] [address], [place],  [address]    Report Status PENDING  Incomplete    Coagulation Studies: Recent Labs    06/25/18 0322  LABPROT 14.8  INR 1.17    Urinalysis: No results for input(s): COLORURINE, LABSPEC, PHURINE, GLUCOSEU, HGBUR, BILIRUBINUR, KETONESUR, PROTEINUR, UROBILINOGEN, NITRITE, LEUKOCYTESUR in the last 72 hours.  Invalid input(s): APPERANCEUR    Imaging: Ct Abdomen Pelvis Wo Contrast  Result Date: 06/25/2018 CLINICAL DATA:  Abdominal pain beginning tonight. History of peritoneal dialysis, with abdominal surgery 1 week ago. History kidney infection and appendectomy. EXAM: CT ABDOMEN AND PELVIS WITHOUT CONTRAST TECHNIQUE: Multidetector CT imaging of the abdomen and pelvis was performed following the standard protocol without IV contrast. COMPARISON:  CT abdomen and pelvis May 29, 2018. FINDINGS: LOWER CHEST: Persistent consolidation LEFT lower lobe, resolution of pleural effusion. Included heart is mildly enlarged. Minimal RIGHT lung base atelectasis. HEPATOBILIARY: Normal. PANCREAS: Normal. SPLEEN: Normal. ADRENALS/URINARY TRACT: Kidneys are orthotopic, demonstrating normal size and morphology. No nephrolithiasis, hydronephrosis; limited assessment for renal masses by nonenhanced CT. The unopacified ureters are normal in course and caliber. Urinary bladder is partially distended and unremarkable. Normal adrenal glands. STOMACH/BOWEL: The stomach, small and large bowel are normal in course and caliber without inflammatory changes, sensitivity decreased by lack of enteric contrast. Moderate volume retained large bowel stool. Mild colonic diverticulosis. VASCULAR/LYMPHATIC: Aortoiliac vessels are normal in course and caliber. No lymphadenopathy by CT  size criteria. REPRODUCTIVE: Normal. OTHER: Intraperitoneal dialysis catheter distal tip in stomach. Small volume pneumoperitoneum and ascites. MUSCULOSKELETAL: Non-acute.  Small fat containing umbilical hernia. IMPRESSION: 1. Peritoneal dialysis and cited a small volume pneumoperitoneum and ascites. 2. Moderate volume retained large bowel stool without bowel obstruction. Mild colonic diverticulosis. 3. LEFT lower lobe atelectasis/scarring, less likely residual/recurrent pneumonia. Electronically Signed   By: Elon Alas M.D.   On: 06/25/2018 04:39   Dg Chest 2 View  Result Date: 06/25/2018 CLINICAL DATA:  Hypoxia EXAM: CHEST - 2 VIEW COMPARISON:  05/23/2018 FINDINGS: Patchy and streaky bilateral airspace opacity greatest on the right. Normal heart size. No edema, effusion, or pneumothorax. Pneumoperitoneum correlating with peritoneal dialysis catheter. IMPRESSION: Patchy bilateral pneumonia. Electronically Signed   By: Monte Fantasia M.D.   On: 06/25/2018 04:37   Ct Chest Wo Contrast  Result Date: 06/25/2018 CLINICAL DATA:  Recent healthcare pneumonia. Sepsis. Evaluate for recurrent pneumonia EXAM: CT CHEST WITHOUT CONTRAST  TECHNIQUE: Multidetector CT imaging of the chest was performed following the standard protocol without IV contrast. COMPARISON:  None. FINDINGS: Cardiovascular: Normal heart size.  No pericardial effusion Mediastinum/Nodes: Negative for adenopathy or mass. Lungs/Pleura: Multifocal airspace disease with reticulonodular appearance in the left lung and segmental consolidation in the right upper lobe. Mild superimposed atelectasis greatest in the left lower lobe. No cavitation or effusion. There is airway thickening and mild centrilobular emphysema. Upper Abdomen: Pneumoperitoneum. Patient is on peritoneal dialysis. There was preceding abdominal CT earlier today. Musculoskeletal: No acute or aggressive finding. IMPRESSION: 1. Multifocal pneumonia with segmental consolidation in the  right upper lobe. 2. Mild emphysema. Electronically Signed   By: Monte Fantasia M.D.   On: 06/25/2018 06:05     Medications:   . cefTRIAXone (ROCEPHIN)  IV 2 g (06/26/18 1012)  . dialysis solution 1.5% low-MG/low-CA     . ALPRAZolam  0.25 mg Oral BID  . calcium acetate  1,334 mg Oral TID WC  . calcium carbonate  1 tablet Oral BID  . docusate sodium  100 mg Oral BID  . gentamicin cream  1 application Topical Daily  . heparin  5,000 Units Subcutaneous Q8H  . hydrocortisone sod succinate (SOLU-CORTEF) inj  50 mg Intravenous Q12H  . pantoprazole  40 mg Oral Daily  . polyethylene glycol  17 g Oral BID  . rOPINIRole  0.5 mg Oral QHS   acetaminophen **OR** acetaminophen, albuterol, morphine injection, ondansetron **OR** ondansetron (ZOFRAN) IV, oxyCODONE, senna-docusate  Assessment/ Plan:  Mr. BERDELL HOSTETLER is a 33 y.o. white male with end stage renal disease on peritoneal dialysis, hypertension, crescentic glomerulonephritis, who is admitted to [facility] on 06/25/2018 for sepsis with suspected peritonitis.   [facility] Dr. Zada Finders  CCPD 9 hrs 4 x 2510mL last fill icodextrin 2000 cc  1.  End-stage renal disease: on peritoneal dialysis.  CCPD with 9 hours 2 liter fills. Due to recent manipulation.  Holding last fill with extraneal.  Continue steroids due to glomerulonephritis. - stress dose due to concerns for vasculitis recurrence and sepsis.   2. Abdominal pain/sepsis/peritonitis: cultures and cell count are not consistent with active infection. However did get antibiotics before collection. Empiric vanco and cetriaxone Contamination done during exchange of transfer set at home.   3. Hypotension: with history of difficult to control hypertension. Holding home medications: clonidine patch, carvedilol, furosemide, hydralazine, isosorbide, metoprolol, minoxidil Secondary to sepsis - holding home blood pressure medications.   4.  Anemia chronic kidney disease: receieved EPO 34000  units on 1/22 and IV venofer 100mg    LOS: 1 Terris Germano 1/23/202010:35 AM

## 2018-06-26 NOTE — Progress Notes (Signed)
[facility] Daily Progress Note   Subjective: Patient without complaint this AM. States feels "better". Tolerated peritoneal dialysis last night. No issues overnight. Afeb, VSS - off pressors. Patient does not want to move forward with PD catheter removal at this time.   Objective: Vitals:   06/26/18 0700 06/26/18 0800 06/26/18 0900 06/26/18 1000  BP: 111/62 132/84 91/61 124/76  Pulse: 75 86 84 83  Resp: 12 10 15 15   Temp:  98.6 F (37 C)    TempSrc:  Oral    SpO2: 97% 99% 97% 98%  Weight:      Height:        Intake/Output Summary (Last 24 hours) at 06/26/2018 1102 Last data filed at 06/26/2018 0617 Gross per 24 hour  Intake 240 ml  Output 688 ml  Net -448 ml   Physical Exam: A&Ox3, NAD CV: RRR Pulmonary: CTA Bilaterally Abdomen: Soft, Nontender, Nondistended, (+) Bowel Sounds  PD catheter dressing, clean and dry  Site with some erythema Vascular: Soft, Non-tender, Minimal Edema   Laboratory: CBC    Component Value Date/Time   WBC 19.2 (H) 06/26/2018 0427   HGB 8.1 (L) 06/26/2018 0427   HGB 12.1 (L) 01/19/2012 0550   HCT 25.9 (L) 06/26/2018 0427   HCT 35.3 (L) 01/19/2012 0550   PLT 282 06/26/2018 0427   PLT 152 01/19/2012 0550   BMET    Component Value Date/Time   NA 138 06/26/2018 0427   NA 134 (L) 01/17/2012 0126   K 3.9 06/26/2018 0427   K 3.7 01/17/2012 0126   CL 101 06/26/2018 0427   CL 100 01/17/2012 0126   CO2 23 06/26/2018 0427   CO2 27 01/17/2012 0126   GLUCOSE 125 (H) 06/26/2018 0427   GLUCOSE 98 01/17/2012 0126   BUN 68 (H) 06/26/2018 0427   BUN 7 01/17/2012 0126   CREATININE 14.09 (H) 06/26/2018 0427   CREATININE 0.94 01/17/2012 0126   CALCIUM 7.6 (L) 06/26/2018 0427   CALCIUM 7.1 (L) 10/10/2017 1400   GFRNONAA 4 (L) 06/26/2018 0427   GFRNONAA >60 01/17/2012 0126   GFRAA 5 (L) 06/26/2018 0427   GFRAA >60 01/17/2012 0126   Assessment/Planning: The patient is a 33 year old male with ESRD on peritoneal dialysis  admitted with pneumonia and possible peritonitis. 1) Patient seems to be doing better clinically today 2) Afeb, VSS, no pressors, tolerating reg diet, tolerating PD  3) At this time, the patient does not want to move forward with removal. This is reasonable given his clinical improvement however will continue to follow  Discussed with Dr. Ellis [facility] PA-C 06/26/2018 11:02 AM

## 2018-06-26 NOTE — Progress Notes (Signed)
CCPD completed without issue. UF 659mL. Report given to primary RN. Patient currently without complaints.

## 2018-06-26 NOTE — Progress Notes (Signed)
Unable to obtain urine sample at this time as patient is a dialysis patient and is oliguric, no need to urinate at this time.  New urinal given, instructed patient to notified nursing when he void.  Continue to monitor.

## 2018-06-26 NOTE — Progress Notes (Signed)
Patient received room assigment, belonings packed and ready to go with patient, report called to receving RN, Terri with no further questions.   Patient and family updated.

## 2018-06-26 NOTE — Progress Notes (Signed)
RN notified Dr Leonidas Romberg that patient had requested to eat stating that he would want to wait another day on cathether removal because he feels that his peritoneal site is getting better, Dr Bunnie Domino note states that he will reevaluate if patient need of removal today.  Per Dr Leonidas Romberg, "Wait until Dr Lucky Cowboy comes in today and discuss with patient"

## 2018-06-27 LAB — BASIC METABOLIC PANEL
Anion gap: 15 (ref 5–15)
BUN: 74 mg/dL — ABNORMAL HIGH (ref 6–20)
CO2: 26 mmol/L (ref 22–32)
Calcium: 8.3 mg/dL — ABNORMAL LOW (ref 8.9–10.3)
Chloride: 96 mmol/L — ABNORMAL LOW (ref 98–111)
Creatinine, Ser: 14.19 mg/dL — ABNORMAL HIGH (ref 0.61–1.24)
GFR calc Af Amer: 5 mL/min — ABNORMAL LOW (ref >60)
GFR calc non Af Amer: 4 mL/min — ABNORMAL LOW (ref >60)
Glucose, Bld: 100 mg/dL — ABNORMAL HIGH (ref 70–99)
POTASSIUM: 3.7 mmol/L (ref 3.5–5.1)
Sodium: 137 mmol/L (ref 135–145)

## 2018-06-27 LAB — CBC
HCT: 27 % — ABNORMAL LOW (ref 39.0–52.0)
Hemoglobin: 8.8 g/dL — ABNORMAL LOW (ref 13.0–17.0)
MCH: 30.9 pg (ref 26.0–34.0)
MCHC: 32.6 g/dL (ref 30.0–36.0)
MCV: 94.7 fL (ref 80.0–100.0)
Platelets: 303 10*3/uL (ref 150–400)
RBC: 2.85 MIL/uL — AB (ref 4.22–5.81)
RDW: 16.4 % — ABNORMAL HIGH (ref 11.5–15.5)
WBC: 14.1 10*3/uL — ABNORMAL HIGH (ref 4.0–10.5)
nRBC: 0.2 % (ref 0.0–0.2)

## 2018-06-27 MED ORDER — RENA-VITE PO TABS
1.0000 | ORAL_TABLET | Freq: Every day | ORAL | Status: DC
  Administered 2018-06-27: 1 via ORAL
  Filled 2018-06-27 (×2): qty 1

## 2018-06-27 MED ORDER — ALUM & MAG HYDROXIDE-SIMETH 200-200-20 MG/5ML PO SUSP
30.0000 mL | ORAL | Status: DC | PRN
  Administered 2018-06-27 – 2018-06-28 (×4): 30 mL via ORAL
  Filled 2018-06-27 (×4): qty 30

## 2018-06-27 MED ORDER — PREDNISONE 20 MG PO TABS
20.0000 mg | ORAL_TABLET | Freq: Every day | ORAL | Status: DC
  Administered 2018-06-28: 20 mg via ORAL
  Filled 2018-06-27: qty 1

## 2018-06-27 NOTE — Progress Notes (Signed)
[facility]  ROUNDING NOTE   Subjective:   PD treatment last night. Tolerated treatment well.   Objective:  Vital signs in last 24 hours:  Temp:  [98 F (36.7 C)-98.1 F (36.7 C)] 98.1 F (36.7 C) (01/24 0334) Pulse Rate:  [71-85] 71 (01/24 0334) Resp:  [12-20] 20 (01/24 0334) BP: (106-126)/(63-95) 116/71 (01/24 0334) SpO2:  [94 %-97 %] 94 % (01/24 0334) Weight:  [80 kg] 80 kg (01/23 1941)  Weight change: 6.3 kg Filed Weights   06/25/18 0829 06/26/18 0500 06/26/18 1941  Weight: 73.7 kg 76.5 kg 80 kg    Intake/Output: I/O last 3 completed shifts: In: 340 [P.O.:240; IV Piggyback:100] Out: 688 [Other:688]   Intake/Output this shift:  Total I/O In: -  Out: 609 [Other:609]  Physical Exam: General: NAD,   Head: Normocephalic, atraumatic. Moist oral mucosal membranes  Eyes: Anicteric, PERRL  Neck: Supple, trachea midline  Lungs:  Clear to auscultation  Heart: Regular rate and rhythm  Abdomen:  +tender, soft  Extremities:  no peripheral edema.  Neurologic: Nonfocal, moving all four extremities  Skin: No lesions  Access: PD catheter    Basic Metabolic Panel: Recent Labs  Lab 06/25/18 0322 06/26/18 0427 06/27/18 0754  NA 139 138 137  K 3.2* 3.9 3.7  CL 97* 101 96*  CO2 27 23 26   GLUCOSE 92 125* 100*  BUN 65* 68* 74*  CREATININE 15.27* 14.09* 14.19*  CALCIUM 8.0* 7.6* 8.3*    Liver Function Tests: Recent Labs  Lab 06/25/18 0322  AST 20  ALT <5  ALKPHOS 54  BILITOT 0.6  PROT 6.4*  ALBUMIN 3.2*   Recent Labs  Lab 06/25/18 0322  LIPASE 68*   No results for input(s): AMMONIA in the last 168 hours.  CBC: Recent Labs  Lab 06/25/18 0322 06/26/18 0427 06/27/18 0754  WBC 19.6* 19.2* 14.1*  NEUTROABS 15.7*  --   --   HGB 7.7* 8.1* 8.8*  HCT 24.9* 25.9* 27.0*  MCV 100.0 98.5 94.7  PLT 274 282 303    Cardiac Enzymes: Recent Labs  Lab 06/25/18 0322  TROPONINI 0.03*    BNP: Invalid input(s): POCBNP  CBG: Recent Labs   Lab 06/25/18 0830  GLUCAP 51    Microbiology: Results for orders placed or performed during the hospital encounter of 06/25/18  Blood Culture (routine x 2)     Status: None (Preliminary result)   Collection Time: 06/25/18  3:22 AM  Result Value Ref Range Status   Specimen Description BLOOD RIGHT AC  Final   Special Requests   Final    BOTTLES DRAWN AEROBIC AND ANAEROBIC Blood Culture results may not be optimal due to an excessive volume of blood received in culture bottles   Culture   Final    NO GROWTH 2 DAYS Performed at [facility], [address] [place], [place] [address]    Report Status PENDING  Incomplete  Blood Culture (routine x 2)     Status: None (Preliminary result)   Collection Time: 06/25/18  5:00 AM  Result Value Ref Range Status   Specimen Description BLOOD LEFT WRIST  Final   Special Requests   Final    BOTTLES DRAWN AEROBIC AND ANAEROBIC Blood Culture adequate volume   Culture   Final    NO GROWTH 2 DAYS Performed at [facility], [address] [place], [place] [address]    Report Status PENDING  Incomplete  MRSA PCR Screening     Status: None  Collection Time: 06/25/18  8:32 AM  Result Value Ref Range Status   MRSA by PCR NEGATIVE NEGATIVE Final    Comment:        The GeneXpert MRSA Assay (FDA approved for NASAL specimens only), is one component of a comprehensive MRSA colonization surveillance program. It is not intended to diagnose MRSA infection nor to guide or monitor treatment for MRSA infections. Performed at [facility], [address] [place], [place] [address]   Body fluid culture     Status: None (Preliminary result)   Collection Time: 06/25/18 11:16 AM  Result Value Ref Range Status   Specimen Description   Final    PERITONEAL DIALYSATE Performed at [facility], [address], [place], [place] [address]    Special Requests   Final    Normal Performed at [facility], [address] [place], [place] [address]    Gram Stain   Final    WBC PRESENT, PREDOMINANTLY MONONUCLEAR NO ORGANISMS SEEN CYTOSPIN SMEAR    Culture   Final    NO GROWTH 2 DAYS Performed at [facility], [place] [address], [place], [place] [address]    Report Status PENDING  Incomplete    Coagulation Studies: Recent Labs    06/25/18 0322  LABPROT 14.8  INR 1.17    Urinalysis: No results for input(s): COLORURINE, LABSPEC, PHURINE, GLUCOSEU, HGBUR, BILIRUBINUR, KETONESUR, PROTEINUR, UROBILINOGEN, NITRITE, LEUKOCYTESUR in the last 72 hours.  Invalid input(s): APPERANCEUR    Imaging: No results found.   Medications:   . cefTRIAXone (ROCEPHIN)  IV 2 g (06/27/18 0926)  . dialysis solution 1.5% low-MG/low-CA     . ALPRAZolam  0.25 mg Oral BID  . calcium acetate  1,334 mg Oral TID WC  . calcium carbonate  1 tablet Oral BID  . docusate sodium  100 mg Oral BID  . gentamicin cream  1 application Topical Daily  . heparin  5,000 Units Subcutaneous Q8H  . pantoprazole  40 mg Oral Daily  . [START ON 06/28/2018] predniSONE  20 mg Oral Q breakfast  . rOPINIRole  0.5 mg Oral QHS   acetaminophen **OR** acetaminophen, albuterol, alum & mag hydroxide-simeth, morphine injection, ondansetron **OR** ondansetron (ZOFRAN) IV, oxyCODONE, senna-docusate  Assessment/ Plan:  [name] is a 33 y.o. white male with end stage renal disease on peritoneal dialysis, hypertension, crescentic glomerulonephritis, who is admitted to [facility] on 06/25/2018 for sepsis with suspected peritonitis.   [facility] Dr[facility] Zada Finders  CCPD 9 hrs 4 x 2547mL last fill icodextrin 2000 cc  1.  End-stage renal disease: on peritoneal dialysis.  CCPD with 9 hours 2 liter fills. Due to recent manipulation.  Holding last fill with extraneal.  Continue steroids due to glomerulonephritis.  - stress dose due to concerns for vasculitis recurrence and sepsis.  - Pending new ANCA  2. Abdominal  pain/sepsis/peritonitis: cultures and cell count are not consistent with active infection. However did get antibiotics before collection. Empiric vanco and cetriaxone Contamination done during exchange of transfer set at home.   3. Hypotension: with history of difficult to control hypertension. Holding home medications: clonidine patch, carvedilol, furosemide, hydralazine, isosorbide, metoprolol, minoxidil Secondary to sepsis - holding home blood pressure medications.   4.  Anemia chronic kidney disease: receieved EPO 34000 units on 1/22 and IV venofer 100mg    LOS: 2 Jeffrey Holloway 1/24/202011:08 AM

## 2018-06-27 NOTE — Care Management (Signed)
Amanda Morris dialysis liaison notified of admission.    

## 2018-06-27 NOTE — Progress Notes (Signed)
ccpd completed 

## 2018-06-27 NOTE — Progress Notes (Signed)
[facility] [place] NAME: Shahab Polhamus    MR#:  [id number]  DATE OF BIRTH:  [birth year]  SUBJECTIVE:   Patient states he continues to feel better each day.  He denies any shortness of breath.  He has been coughing up some "mucus".  No chest pain.  His belly pain is much better.  He has been tolerating peritoneal dialysis without any pain.  Afebrile.  REVIEW OF SYSTEMS:  Review of Systems  Constitutional: Negative for chills and fever.  HENT: Negative for congestion and sore throat.   Eyes: Negative for blurred vision and double vision.  Respiratory: Positive for cough and sputum production. Negative for shortness of breath.   Cardiovascular: Negative for chest pain, palpitations and leg swelling.  Gastrointestinal: Negative for abdominal pain, nausea and vomiting.  Genitourinary: Negative for flank pain.  Musculoskeletal: Negative for back pain and neck pain.  Neurological: Negative for dizziness and headaches.  Psychiatric/Behavioral: Negative for depression. The patient is not nervous/anxious.     DRUG ALLERGIES:   Allergies  Allergen Reactions  . Acetaminophen Nausea And Vomiting   VITALS:  Blood pressure 116/71, pulse 71, temperature 98.1 F (36.7 C), temperature source Oral, resp. rate 20, height 6' (1.829 m), weight 80 kg, SpO2 94 %. PHYSICAL EXAMINATION:  Physical Exam  GENERAL:  33 y.o.-year-old patient lying in the bed with no acute distress.  EYES: Pupils equal, round, reactive to light and accommodation. No scleral icterus. Extraocular muscles intact.  HEENT: Head atraumatic, normocephalic. Oropharynx and nasopharynx clear. No oropharyngeal erythema, moist oral mucosa  NECK:  Supple, no jugular venous distention. No thyroid enlargement, no tenderness.  LUNGS: Normal breath sounds bilaterally, no wheezing, rales, rhonchi. No use of accessory muscles of respiration.  CARDIOVASCULAR: RRR, S1, S2 normal. No murmurs, rubs,  or gallops.  ABDOMEN: Soft, nontender, nondistended. Bowel sounds present. No organomegaly or mass. PD site unremarkable. EXTREMITIES: No pedal edema, cyanosis, or clubbing. NEUROLOGIC: Cranial nerves II through XII are intact. No focal Motor or sensory deficits appreciated b/l PSYCHIATRIC: The patient is alert and oriented x 3.  SKIN: No obvious rash, lesion, or ulcer.  LABORATORY PANEL:  Male CBC Recent Labs  Lab 06/27/18 0754  WBC 14.1*  HGB 8.8*  HCT 27.0*  PLT 303   ------------------------------------------------------------------------------------------------------------------ Chemistries  Recent Labs  Lab 06/25/18 0322  06/27/18 0754  NA 139   < > 137  K 3.2*   < > 3.7  CL 97*   < > 96*  CO2 27   < > 26  GLUCOSE 92   < > 100*  BUN 65*   < > 74*  CREATININE 15.27*   < > 14.19*  CALCIUM 8.0*   < > 8.3*  AST 20  --   --   ALT <5  --   --   ALKPHOS 54  --   --   BILITOT 0.6  --   --    < > = values in this interval not displayed.   RADIOLOGY:  No results found. ASSESSMENT AND PLAN:   Sepsis- secondary to HCAP versus PD catheter infection.  Sepsis resolving.  Afebrile.  Leukocytosis improving. -Continue ceftriaxone -Blood cultures with no growth to date -Peritoneal fluid culture pending, but cell count unremarkable.  PD catheter site infection- clinically improving -Continue ceftriaxone -Vascular following- patient does not want peritoneal catheter removed at this time  HCAP- improving, stable on room air.  CT chest with multifocal pneumonia.  -  Continue ceftriaxone  Hypertension- BPs improving -Holding home BP meds -Stop solucortef today, place back on home prednisone  ESRD on PD -Nephrology following  DVT prophylaxis- heparin subcutaneous  All the records are reviewed and case discussed with Care Management/Social Worker. Management plans discussed with the patient, family and they are in agreement.  CODE STATUS: Full Code  TOTAL TIME TAKING  CARE OF THIS PATIENT: 35 minutes.   More than 50% of the time was spent in counseling/coordination of care: YES  POSSIBLE D/C tomorrow, DEPENDING ON CLINICAL CONDITION.   Berna Spare Mayo M.D on 06/27/2018 at 10:57 AM  Between 7am to 6pm - Pager - [phone]  After 6pm go to [email or web address] - Proofreader  [facility]  Office  [phone]  CC: Primary care physician; Patient, No Pcp Per  Note: This dictation was prepared with Dragon dictation along with smaller phrase technology. Any transcriptional errors that result from this process are unintentional.

## 2018-06-28 LAB — BASIC METABOLIC PANEL
Anion gap: 15 (ref 5–15)
BUN: 76 mg/dL — ABNORMAL HIGH (ref 6–20)
CHLORIDE: 95 mmol/L — AB (ref 98–111)
CO2: 28 mmol/L (ref 22–32)
Calcium: 8.7 mg/dL — ABNORMAL LOW (ref 8.9–10.3)
Creatinine, Ser: 13.93 mg/dL — ABNORMAL HIGH (ref 0.61–1.24)
GFR calc Af Amer: 5 mL/min — ABNORMAL LOW (ref >60)
GFR calc non Af Amer: 4 mL/min — ABNORMAL LOW (ref >60)
Glucose, Bld: 94 mg/dL (ref 70–99)
Potassium: 3.2 mmol/L — ABNORMAL LOW (ref 3.5–5.1)
Sodium: 138 mmol/L (ref 135–145)

## 2018-06-28 LAB — CBC
HCT: 28.8 % — ABNORMAL LOW (ref 39.0–52.0)
HEMOGLOBIN: 9.1 g/dL — AB (ref 13.0–17.0)
MCH: 29.6 pg (ref 26.0–34.0)
MCHC: 31.6 g/dL (ref 30.0–36.0)
MCV: 93.8 fL (ref 80.0–100.0)
Platelets: 323 10*3/uL (ref 150–400)
RBC: 3.07 MIL/uL — AB (ref 4.22–5.81)
RDW: 16.3 % — ABNORMAL HIGH (ref 11.5–15.5)
WBC: 9.5 10*3/uL (ref 4.0–10.5)
nRBC: 0.9 % — ABNORMAL HIGH (ref 0.0–0.2)

## 2018-06-28 LAB — BODY FLUID CULTURE
CULTURE: NO GROWTH
Special Requests: NORMAL

## 2018-06-28 LAB — MPO/PR-3 (ANCA) ANTIBODIES
ANCA Proteinase 3: <3.5 U/mL (ref 0.0–3.5)
Myeloperoxidase Abs: <9 U/mL (ref 0.0–9.0)

## 2018-06-28 MED ORDER — SODIUM CHLORIDE 0.9 % IV SOLN
INTRAVENOUS | Status: DC | PRN
  Administered 2018-06-28: 250 mL via INTRAVENOUS

## 2018-06-28 MED ORDER — PREDNISONE 20 MG PO TABS: 20.0000 mg | ORAL_TABLET | Freq: Every day | ORAL | 0 refills | Status: AC

## 2018-06-28 MED ORDER — LEVOFLOXACIN 500 MG PO TABS: 500.0000 mg | ORAL_TABLET | ORAL | 0 refills | Status: AC

## 2018-06-28 MED ORDER — OXYCODONE HCL 5 MG PO TABS: 5.0000 mg | ORAL_TABLET | Freq: Four times a day (QID) | ORAL | 0 refills | Status: AC | PRN

## 2018-06-28 MED ORDER — ONDANSETRON HCL 4 MG PO TABS: 4.0000 mg | ORAL_TABLET | Freq: Three times a day (TID) | ORAL | 0 refills | Status: AC | PRN

## 2018-06-28 NOTE — Progress Notes (Addendum)
[facility]  ROUNDING NOTE   Subjective:   PD treatment last night. Tolerated treatment well. UF 450.   Nausea this morning.  Objective:  Vital signs in last 24 hours:  Temp:  [98 F (36.7 C)-98.3 F (36.8 C)] 98.3 F (36.8 C) (01/25 0425) Pulse Rate:  [71-80] 71 (01/25 0425) Resp:  [20-22] 20 (01/25 0425) BP: (118-140)/(67-106) 127/85 (01/25 0425) SpO2:  [95 %-100 %] 95 % (01/25 0425)  Weight change:  Filed Weights   06/25/18 0829 06/26/18 0500 06/26/18 1941  Weight: 73.7 kg 76.5 kg 80 kg    Intake/Output: I/O last 3 completed shifts: In: 240 [P.O.:240] Out: 609 [Other:609]   Intake/Output this shift:  Total I/O In: -  Out: 430 [Other:430]  Physical Exam: General: NAD,   Head: Normocephalic, atraumatic. Moist oral mucosal membranes  Eyes: Anicteric, PERRL  Neck: Supple, trachea midline  Lungs:  Clear to auscultation  Heart: Regular rate and rhythm  Abdomen:  +nontender, soft  Extremities:  no peripheral edema.  Neurologic: Nonfocal, moving all four extremities  Skin: No lesions  Access: PD catheter    Basic Metabolic Panel: Recent Labs  Lab 06/25/18 0322 06/26/18 0427 06/27/18 0754 06/28/18 0447  NA 139 138 137 138  K 3.2* 3.9 3.7 3.2*  CL 97* 101 96* 95*  CO2 27 23 26 28   GLUCOSE 92 125* 100* 94  BUN 65* 68* 74* 76*  CREATININE 15.27* 14.09* 14.19* 13.93*  CALCIUM 8.0* 7.6* 8.3* 8.7*    Liver Function Tests: Recent Labs  Lab 06/25/18 0322  AST 20  ALT <5  ALKPHOS 54  BILITOT 0.6  PROT 6.4*  ALBUMIN 3.2*   Recent Labs  Lab 06/25/18 0322  LIPASE 68*   No results for input(s): AMMONIA in the last 168 hours.  CBC: Recent Labs  Lab 06/25/18 0322 06/26/18 0427 06/27/18 0754 06/28/18 0447  WBC 19.6* 19.2* 14.1* 9.5  NEUTROABS 15.7*  --   --   --   HGB 7.7* 8.1* 8.8* 9.1*  HCT 24.9* 25.9* 27.0* 28.8*  MCV 100.0 98.5 94.7 93.8  PLT 274 282 303 323    Cardiac Enzymes: Recent Labs  Lab 06/25/18 0322   TROPONINI 0.03*    BNP: Invalid input(s): POCBNP  CBG: Recent Labs  Lab 06/25/18 0830  GLUCAP 40    Microbiology: Results for orders placed or performed during the hospital encounter of 06/25/18  Blood Culture (routine x 2)     Status: None (Preliminary result)   Collection Time: 06/25/18  3:22 AM  Result Value Ref Range Status   Specimen Description BLOOD RIGHT AC  Final   Special Requests   Final    BOTTLES DRAWN AEROBIC AND ANAEROBIC Blood Culture results may not be optimal due to an excessive volume of blood received in culture bottles   Culture   Final    NO GROWTH 3 DAYS Performed at [facility], [address], [place], [place] [address]    Report Status PENDING  Incomplete  Blood Culture (routine x 2)     Status: None (Preliminary result)   Collection Time: 06/25/18  5:00 AM  Result Value Ref Range Status   Specimen Description BLOOD LEFT WRIST  Final   Special Requests   Final    BOTTLES DRAWN AEROBIC AND ANAEROBIC Blood Culture adequate volume   Culture   Final    NO GROWTH 3 DAYS Performed at [facility], [address], [place], [place] [address]  Report Status PENDING  Incomplete  MRSA PCR Screening     Status: None   Collection Time: 06/25/18  8:32 AM  Result Value Ref Range Status   MRSA by PCR NEGATIVE NEGATIVE Final    Comment:        The GeneXpert MRSA Assay (FDA approved for NASAL specimens only), is one component of a comprehensive MRSA colonization surveillance program. It is not intended to diagnose MRSA infection nor to guide or monitor treatment for MRSA infections. Performed at [facility], [address] [place], [place] [address]   Body fluid culture     Status: None (Preliminary result)   Collection Time: 06/25/18 11:16 AM  Result Value Ref Range Status   Specimen Description   Final    PERITONEAL DIALYSATE Performed at [facility], [address] [place], [place] [address]     Special Requests   Final    Normal Performed at [facility], [address] [place], [place] [address]    Gram Stain   Final    WBC PRESENT, PREDOMINANTLY MONONUCLEAR NO ORGANISMS SEEN CYTOSPIN SMEAR    Culture   Final    NO GROWTH 3 DAYS Performed at [facility], [place] [address], [place], [place] [address]    Report Status PENDING  Incomplete    Coagulation Studies: No results for input(s): LABPROT, INR in the last 72 hours.  Urinalysis: No results for input(s): COLORURINE, LABSPEC, PHURINE, GLUCOSEU, HGBUR, BILIRUBINUR, KETONESUR, PROTEINUR, UROBILINOGEN, NITRITE, LEUKOCYTESUR in the last 72 hours.  Invalid input(s): APPERANCEUR    Imaging: No results found.   Medications:   . sodium chloride 250 mL (06/28/18 0827)  . cefTRIAXone (ROCEPHIN)  IV 2 g (06/28/18 0828)  . dialysis solution 1.5% low-MG/low-CA     . ALPRAZolam  0.25 mg Oral BID  . calcium acetate  1,334 mg Oral TID WC  . calcium carbonate  1 tablet Oral BID  . docusate sodium  100 mg Oral BID  . gentamicin cream  1 application Topical Daily  . heparin  5,000 Units Subcutaneous Q8H  . multivitamin  1 tablet Oral Daily  . pantoprazole  40 mg Oral Daily  . predniSONE  20 mg Oral Q breakfast  . rOPINIRole  0.5 mg Oral QHS   sodium chloride, acetaminophen **OR** acetaminophen, albuterol, alum & mag hydroxide-simeth, morphine injection, ondansetron **OR** ondansetron (ZOFRAN) IV, oxyCODONE, senna-docusate  Assessment/ Plan:  [name] is a 33 y.o. white male with end stage renal disease on peritoneal dialysis, hypertension, crescentic glomerulonephritis, who is admitted to [facility] on 06/25/2018 for sepsis with suspected peritonitis.   [facility] Dr. Zada Finders  CCPD 9 hrs 4 x 2564mL last fill icodextrin 2000 cc  1.  End-stage renal disease: on peritoneal dialysis.  CCPD with 9 hours 2 liter fills. Due to recent manipulation.  Holding last fill with extraneal.  Continue  steroids due to glomerulonephritis.  - stress dose due to concerns for vasculitis recurrence and sepsis. Now back to home dose - Pending new ANCA  2. Abdominal pain/sepsis/peritonitis: cultures and cell count are not consistent with active infection. However did get antibiotics before collection. Contamination done during exchange of transfer set at home.  - levaquin.   3. Hypotension: with history of difficult to control hypertension. Holding home medications: clonidine patch, carvedilol, furosemide, hydralazine, isosorbide, metoprolol, minoxidil Secondary to sepsis - holding home blood pressure medications.   4.  Anemia chronic kidney disease: receieved EPO 34000 units  on 1/22 and IV venofer 100mg    LOS: 3 Jeffrey Holloway 1/25/202011:32 AM

## 2018-06-28 NOTE — Discharge Instructions (Signed)
It was so nice to meet you during this hospitalization!  We treated you for both pneumonia and an infection of your peritoneal fluid. You will need to take Levaquin 1 tablet daily every other day- please take this on 1/26 and 1/28.  Your blood pressures have been on the low side. I am holding your blood pressure medicines for now. Please take Coreg twice a day, but do not take any of the other medicines for now. Please follow-up with Dr. Holley Raring in 5 days for a blood pressure check.  Take care, Dr. Brett Albino

## 2018-06-28 NOTE — Progress Notes (Signed)
Patient discharge teaching given, including activity, diet, follow-up appoints, and medications. Patient verbalized understanding of all discharge instructions. IV access was d/c'd. Vitals are stable. Skin is intact except as charted in most recent assessments. Pt to be escorted out by NT, to be driven home by family.  Jeffrey Holloway  

## 2018-06-28 NOTE — Discharge Summary (Signed)
[facility] [place] NAME: Jeffrey Holloway    MR#:  [id number]  Hallock:  [birth year]  DATE OF ADMISSION:  06/25/2018   ADMITTING PHYSICIAN: Hillary Bow, MD  DATE OF DISCHARGE: 06/28/18  PRIMARY CARE PHYSICIAN: Patient, No Pcp Per   ADMISSION DIAGNOSIS:  Hypoxemia [R09.02] Generalized abdominal pain [R10.84] SBP (spontaneous bacterial peritonitis) ([place]) [K65.2] Healthcare-associated pneumonia [J18.9] Elevated troponin I level [R79.89] Sepsis, due to unspecified organism, unspecified whether acute organ dysfunction present ([place]) [A41.9] DISCHARGE DIAGNOSIS:  Active Problems:   SBP (spontaneous bacterial peritonitis) ([place])  SECONDARY DIAGNOSIS:   Past Medical History:  Diagnosis Date  . Constipation   . Diarrhea   . Glomerulonephritis   . Hemorrhoids   . Kidney infection   . Nausea and vomiting   . Occult blood in stools   . Shortness of breath   . Sleep apnea    HOSPITAL COURSE:   Jeffrey Holloway is a 33 year old male presented to the ED with increasing pain at his peritoneal dialysis catheter site.  In the ED, he was meeting sepsis criteria with leukocytosis, tachycardia.  He also was noted to have hypotension.  CT abdomen pelvis was negative.  CT chest showed bilateral pneumonia.  He had some hypoxia to 89% on room air.  He was started on IV antibiotics for coverage of both HCAP and concern for bacterial peritonitis.  He was admitted to the ICU.  Sepsis- secondary to HCAP versus PD catheter infection.    Sepsis resolved. -Initially treated with vancomycin and ceftriaxone, then changed to ceftriaxone monotherapy.  Discharged home on Levaquin for a total 7-day course. -Blood cultures with no growth -Peritoneal fluid culture with no growth  PD catheter site infection- clinically improving -Treated with vanc/ceftriaxone and then transitioned to Levaquin on discharge -Seen by vascular surgery- patient stated that he did not want  PD catheter removed.  Will need to follow-up with vascular as an outpatient.  HCAP- improving, stable on room air.  CT chest with multifocal pneumonia.  -Discharged home on Levaquin for a total 7-day course  Hypertension- BPs soft this admission, likely secondary to sepsis -Restarted Coreg on discharge, but the remainder of his BP meds were held -Needs to follow-up with nephrology in 5 days for BP check  ESRD on PD -Seen by nephrology -Tolerated peritoneal dialysis without any issues this admission  DISCHARGE CONDITIONS:  PD catheter infection HCAP Hypertension ESRD on PD CONSULTS OBTAINED:  Nephrology Vascular surgery CCM DRUG ALLERGIES:   Allergies  Allergen Reactions  . Acetaminophen Nausea And Vomiting   DISCHARGE MEDICATIONS:   Allergies as of 06/28/2018      Reactions   Acetaminophen Nausea And Vomiting      Medication List    STOP taking these medications   amLODipine 10 MG tablet Commonly known as:  NORVASC   cloNIDine 0.1 MG tablet Commonly known as:  CATAPRES   cloNIDine 0.2 mg/24hr patch Commonly known as:  CATAPRES - Dosed in mg/24 hr   hydrALAZINE 50 MG tablet Commonly known as:  APRESOLINE   metoprolol tartrate 50 MG tablet Commonly known as:  LOPRESSOR   minoxidil 10 MG tablet Commonly known as:  LONITEN     TAKE these medications   ALPRAZolam 0.25 MG tablet Commonly known as:  XANAX Take 1 tablet by mouth 2 (two) times daily.   calcium acetate 667 MG capsule Commonly known as:  PHOSLO Take 2 capsules (1,334 mg total) by mouth 3 (three) times  daily with meals.   calcium carbonate 500 MG chewable tablet Commonly known as:  TUMS - dosed in mg elemental calcium Chew 1 tablet by mouth 2 (two) times daily.   carvedilol 6.25 MG tablet Commonly known as:  COREG Take 1 tablet by mouth 2 (two) times daily.   feeding supplement (NEPRO CARB STEADY) Liqd Take 237 mLs by mouth 2 (two) times daily between meals.   furosemide 80 MG  tablet Commonly known as:  LASIX Take 1 tablet by mouth daily.   isosorbide mononitrate 30 MG 24 hr tablet Commonly known as:  IMDUR Take 1 tablet (30 mg total) by mouth daily.   levofloxacin 500 MG tablet Commonly known as:  LEVAQUIN Take 1 tablet (500 mg total) by mouth every other day.   losartan 100 MG tablet Commonly known as:  COZAAR Take 1 tablet (100 mg total) by mouth daily.   multivitamin Tabs tablet Take 1 tablet by mouth at bedtime.   omeprazole 20 MG capsule Commonly known as:  PRILOSEC Take 20 mg by mouth daily.   ondansetron 4 MG tablet Commonly known as:  ZOFRAN Take 1 tablet (4 mg total) by mouth every 8 (eight) hours as needed for nausea or vomiting.   oxyCODONE 5 MG immediate release tablet Commonly known as:  ROXICODONE Take 1 tablet (5 mg total) by mouth every 6 (six) hours as needed for severe pain.   predniSONE 20 MG tablet Commonly known as:  DELTASONE Take 1 tablet (20 mg total) by mouth daily with breakfast.   rOPINIRole 0.5 MG tablet Commonly known as:  REQUIP Take 1 tablet by mouth at bedtime.        DISCHARGE INSTRUCTIONS:  1.  Follow-up with PCP in 5 days 2.  Follow-up with nephrology in 5 days 3.  Follow-up with vascular surgery in 1 to 2 weeks 4.  Hold all blood pressure medicines except for Coreg until blood pressures improve DIET:  Renal diet DISCHARGE CONDITION:  Stable ACTIVITY:  Activity as tolerated OXYGEN:  Home Oxygen: No.  Oxygen Delivery: room air DISCHARGE LOCATION:  home   If you experience worsening of your admission symptoms, develop shortness of breath, life threatening emergency, suicidal or homicidal thoughts you must seek medical attention immediately by calling 911 or calling your MD immediately  if symptoms less severe.  You Must read complete instructions/literature along with all the possible adverse reactions/side effects for all the Medicines you take and that have been prescribed to you. Take any  new Medicines after you have completely understood and accpet all the possible adverse reactions/side effects.   Please note  You were cared for by a hospitalist during your hospital stay. If you have any questions about your discharge medications or the care you received while you were in the hospital after you are discharged, you can call the unit and asked to speak with the hospitalist on call if the hospitalist that took care of you is not available. Once you are discharged, your primary care physician will handle any further medical issues. Please note that NO REFILLS for any discharge medications will be authorized once you are discharged, as it is imperative that you return to your primary care physician (or establish a relationship with a primary care physician if you do not have one) for your aftercare needs so that they can reassess your need for medications and monitor your lab values.    On the day of Discharge:  VITAL SIGNS:  Blood pressure 127/85, pulse  71, temperature 98.3 F (36.8 C), temperature source Oral, resp. rate 20, height 6' (1.829 m), weight 80 kg, SpO2 95 %. PHYSICAL EXAMINATION:  GENERAL:33 y.o.-year-old patient lying in the bed with no acute distress.  EYES: Pupils equal, round, reactive to light and accommodation. No scleral icterus. Extraocular muscles intact.  HEENT: Head atraumatic, normocephalic. Oropharynx and nasopharynx clear. No oropharyngeal erythema, moist oral mucosa  NECK: Supple, no jugular venous distention. No thyroid enlargement, no tenderness.  LUNGS: Normal breath sounds bilaterally, no wheezing, rales, rhonchi. No use of accessory muscles of respiration.  CARDIOVASCULAR: RRR, S1, S2 normal. No murmurs, rubs, or gallops.  ABDOMEN: Soft, nontender, nondistended. Bowel sounds present. No organomegaly or mass.PD site unremarkable. EXTREMITIES: No pedal edema, cyanosis, or clubbing. NEUROLOGIC: Cranial nerves II through XII are intact. No focal  Motor or sensory deficits appreciated b/l PSYCHIATRIC: The patient is alert and oriented x 3.  SKIN: No obvious rash, lesion, or ulcer.  DATA REVIEW:   CBC Recent Labs  Lab 06/28/18 0447  WBC 9.5  HGB 9.1*  HCT 28.8*  PLT 323    Chemistries  Recent Labs  Lab 06/25/18 0322  06/28/18 0447  NA 139   < > 138  K 3.2*   < > 3.2*  CL 97*   < > 95*  CO2 27   < > 28  GLUCOSE 92   < > 94  BUN 65*   < > 76*  CREATININE 15.27*   < > 13.93*  CALCIUM 8.0*   < > 8.7*  AST 20  --   --   ALT <5  --   --   ALKPHOS 54  --   --   BILITOT 0.6  --   --    < > = values in this interval not displayed.     Microbiology Results  Results for orders placed or performed during the hospital encounter of 06/25/18  Blood Culture (routine x 2)     Status: None (Preliminary result)   Collection Time: 06/25/18  3:22 AM  Result Value Ref Range Status   Specimen Description BLOOD RIGHT AC  Final   Special Requests   Final    BOTTLES DRAWN AEROBIC AND ANAEROBIC Blood Culture results may not be optimal due to an excessive volume of blood received in culture bottles   Culture   Final    NO GROWTH 3 DAYS Performed at [facility], [address], [place], [place] [address]    Report Status PENDING  Incomplete  Blood Culture (routine x 2)     Status: None (Preliminary result)   Collection Time: 06/25/18  5:00 AM  Result Value Ref Range Status   Specimen Description BLOOD LEFT WRIST  Final   Special Requests   Final    BOTTLES DRAWN AEROBIC AND ANAEROBIC Blood Culture adequate volume   Culture   Final    NO GROWTH 3 DAYS Performed at [facility], [address], [place], [place] [address]    Report Status PENDING  Incomplete  MRSA PCR Screening     Status: None   Collection Time: 06/25/18  8:32 AM  Result Value Ref Range Status   MRSA by PCR NEGATIVE NEGATIVE Final    Comment:        The GeneXpert MRSA Assay (FDA approved for NASAL specimens only), is one component of  a comprehensive MRSA colonization surveillance program. It is not intended to diagnose MRSA infection nor to guide or monitor treatment  for MRSA infections. Performed at [facility], [address] [place], [place] [address]   Body fluid culture     Status: None (Preliminary result)   Collection Time: 06/25/18 11:16 AM  Result Value Ref Range Status   Specimen Description   Final    PERITONEAL DIALYSATE Performed at [facility], [address] [place], [place] [address]    Special Requests   Final    Normal Performed at [facility], [address] [place], [place] [address]    Gram Stain   Final    WBC PRESENT, PREDOMINANTLY MONONUCLEAR NO ORGANISMS SEEN CYTOSPIN SMEAR    Culture   Final    NO GROWTH 3 DAYS Performed at [facility], [place] [address] [place], [place] [address]    Report Status PENDING  Incomplete    RADIOLOGY:  No results found.   Management plans discussed with the patient, family and they are in agreement.  CODE STATUS: Full Code   TOTAL TIME TAKING CARE OF THIS PATIENT: 35 minutes.    Berna Spare Mayo M.D on 06/28/2018 at 1:13 PM  Between 7am to 6pm - Pager [phone]  After 6pm go to [email or web address] - Proofreader  [employer]  Hospitalists  Office  [phone]  CC: Primary care physician; Patient, No Pcp Per   Note: This dictation was prepared with Dragon dictation along with smaller phrase technology. Any transcriptional errors that result from this process are unintentional.

## 2018-06-30 LAB — CULTURE, BLOOD (ROUTINE X 2)
CULTURE: NO GROWTH
Culture: NO GROWTH
Special Requests: ADEQUATE

## 2018-06-30 LAB — ANCA TITERS
Atypical P-ANCA titer: <1:20 {titer}
C-ANCA: <1:20 {titer}
P-ANCA: 1:20 {titer} — ABNORMAL HIGH

## 2018-07-22 ENCOUNTER — Encounter: Admit: 2018-07-22 | Discharge: 2018-07-22 | Payer: PRIVATE HEALTH INSURANCE | Attending: Surgery | Primary: Surgery

## 2018-08-04 DIAGNOSIS — N186 End stage renal disease: Principal | ICD-10-CM

## 2018-08-04 DIAGNOSIS — Z7682 Awaiting organ transplant status: Principal | ICD-10-CM

## 2018-08-04 DIAGNOSIS — Z01818 Encounter for other preprocedural examination: Principal | ICD-10-CM

## 2018-08-06 ENCOUNTER — Encounter: Admit: 2018-08-06 | Discharge: 2018-08-06 | Payer: PRIVATE HEALTH INSURANCE | Attending: Surgery | Primary: Surgery

## 2018-08-14 DIAGNOSIS — Z7682 Awaiting organ transplant status: Principal | ICD-10-CM

## 2018-08-14 DIAGNOSIS — N186 End stage renal disease: Principal | ICD-10-CM

## 2018-08-14 DIAGNOSIS — Z01818 Encounter for other preprocedural examination: Principal | ICD-10-CM

## 2018-08-20 ENCOUNTER — Other Ambulatory Visit: Payer: Self-pay

## 2018-08-20 ENCOUNTER — Emergency Department
Admission: EM | Admit: 2018-08-20 | Discharge: 2018-08-21 | Disposition: A | Discharge status: Home / Self Care | Payer: BLUE CROSS/BLUE SHIELD | Attending: Emergency Medicine | Admitting: Emergency Medicine

## 2018-08-20 ENCOUNTER — Encounter: Payer: Self-pay | Admitting: *Deleted

## 2018-08-20 DIAGNOSIS — R51 Headache: Secondary | ICD-10-CM | POA: Diagnosis present

## 2018-08-20 DIAGNOSIS — Z87891 Personal history of nicotine dependence: Secondary | ICD-10-CM | POA: Diagnosis not present

## 2018-08-20 DIAGNOSIS — Z79899 Other long term (current) drug therapy: Secondary | ICD-10-CM | POA: Diagnosis not present

## 2018-08-20 DIAGNOSIS — N186 End stage renal disease: Secondary | ICD-10-CM | POA: Insufficient documentation

## 2018-08-20 DIAGNOSIS — R519 Headache, unspecified: Secondary | ICD-10-CM

## 2018-08-20 DIAGNOSIS — I120 Hypertensive chronic kidney disease with stage 5 chronic kidney disease or end stage renal disease: Secondary | ICD-10-CM | POA: Diagnosis not present

## 2018-08-20 MED ORDER — MAGNESIUM SULFATE 2 GM/50ML IV SOLN
2.0000 g | Freq: Once | INTRAVENOUS | Status: AC
  Administered 2018-08-20: 2 g via INTRAVENOUS
  Filled 2018-08-20: qty 50

## 2018-08-20 MED ORDER — HALOPERIDOL LACTATE 5 MG/ML IJ SOLN
5.0000 mg | Freq: Once | INTRAMUSCULAR | Status: AC
  Administered 2018-08-20: 5 mg via INTRAVENOUS
  Filled 2018-08-20: qty 1

## 2018-08-20 MED ORDER — PROCHLORPERAZINE EDISYLATE 10 MG/2ML IJ SOLN
10.0000 mg | Freq: Once | INTRAMUSCULAR | Status: AC
  Administered 2018-08-20: 10 mg via INTRAVENOUS
  Filled 2018-08-20: qty 2

## 2018-08-20 NOTE — ED Provider Notes (Signed)
[facility] Emergency Department Provider Note    ____________________________________________   I have reviewed the triage vital signs and the nursing notes.   HISTORY  Chief Complaint Headache  History limited by: Not Limited   HPI Jeffrey Holloway is a 33 y.o. male who presents to the emergency department today because of concern for headache. Patient states it is severe. Located in the right side of his head. The patient states it has been on and off for the past 2 weeks. He does have history of headaches but states it has been more severe. Denies any trauma to his head. Denies any weakness or numbness to his arms or legs. Does have history of htn but states he took his medication tonight. No fevers.    Records reviewed. Per medical record review patient has a history of ER visit in the past for headache, and admission last year for headache.   Past Medical History:  Diagnosis Date  . Constipation   . Diarrhea   . Glomerulonephritis   . Hemorrhoids   . Kidney infection   . Nausea and vomiting   . Occult blood in stools   . Shortness of breath   . Sleep apnea     Patient Active Problem List   Diagnosis Date Noted  . SBP (spontaneous bacterial peritonitis) ([place]) 06/25/2018  . HCAP (healthcare-associated pneumonia) 05/30/2018  . Peritonitis ([place]) 05/29/2018  . Abdominal pain 05/24/2018  . Hypertensive urgency, malignant 03/31/2018  . End-stage renal disease on peritoneal dialysis ([place]) 01/22/2018  . Essential hypertension 01/22/2018  . Cellulitis 11/25/2017  . 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 GERD (gastroesophageal reflux disease) 10/10/2017  . Rectal foreign body     Past Surgical History:  Procedure Laterality Date  . APPENDECTOMY    . DIALYSIS/PERMA CATHETER INSERTION N/A 10/15/2017   Procedure: DIALYSIS/PERMA CATHETER INSERTION;  Surgeon: Katha Cabal, MD;  Location: [place] [facility];  Service: Cardiovascular;   Laterality: N/A;  . DIALYSIS/PERMA CATHETER REMOVAL N/A 05/12/2018   Procedure: DIALYSIS/PERMA CATHETER REMOVAL;  Surgeon: Algernon Huxley, MD;  Location: [place] [facility];  Service: Cardiovascular;  Laterality: N/A;    Prior to Admission medications   Medication Sig Start Date End Date Taking? Authorizing Provider  ALPRAZolam Duanne Moron) 0.25 MG tablet Take 1 tablet by mouth 2 (two) times daily. 05/02/18   [provider]  calcium acetate (PHOSLO) 667 MG capsule Take 2 capsules (1,334 mg total) by mouth 3 (three) times daily with meals. 10/18/17   Epifanio Lesches, MD  calcium carbonate (TUMS - DOSED IN MG ELEMENTAL CALCIUM) 500 MG chewable tablet Chew 1 tablet by mouth 2 (two) times daily.    [provider]  carvedilol (COREG) 6.25 MG tablet Take 1 tablet by mouth 2 (two) times daily. 04/05/18   [provider]  furosemide (LASIX) 80 MG tablet Take 1 tablet by mouth daily. 04/29/18   [provider]  isosorbide mononitrate (IMDUR) 30 MG 24 hr tablet Take 1 tablet (30 mg total) by mouth daily. 04/04/18   Hillary Bow, MD  levofloxacin (LEVAQUIN) 500 MG tablet Take 1 tablet (500 mg total) by mouth every other day. 06/28/18   Mayo, Pete Pelt, MD  losartan (COZAAR) 100 MG tablet Take 1 tablet (100 mg total) by mouth daily. 10/18/17   Epifanio Lesches, MD  multivitamin (RENA-VIT) TABS tablet Take 1 tablet by mouth at bedtime. 10/18/17   Epifanio Lesches, MD  Nutritional Supplements (FEEDING SUPPLEMENT, NEPRO CARB STEADY,) LIQD Take 854  mLs by mouth 2 (two) times daily between meals. 10/18/17   Epifanio Lesches, MD  omeprazole (PRILOSEC) 20 MG capsule Take 20 mg by mouth daily.    [provider]  ondansetron (ZOFRAN) 4 MG tablet Take 1 tablet (4 mg total) by mouth every 8 (eight) hours as needed for nausea or vomiting. 06/28/18   Mayo, Pete Pelt, MD  oxyCODONE (ROXICODONE) 5 MG immediate release tablet Take 1 tablet (5 mg total) by mouth every 6  (six) hours as needed for severe pain. 06/28/18 06/28/19  Mayo, Pete Pelt, MD  predniSONE (DELTASONE) 20 MG tablet Take 1 tablet (20 mg total) by mouth daily with breakfast. 06/28/18   Mayo, Pete Pelt, MD  rOPINIRole (REQUIP) 0.5 MG tablet Take 1 tablet by mouth at bedtime. 03/21/18   [provider]    Allergies Acetaminophen  Family History  Problem Relation Age of Onset  . Varicose Veins Neg Hx   . Vision loss Neg Hx   . Stroke Neg Hx     Social History Social History   Tobacco Use  . Smoking status: Former Smoker    Packs/day: 0.25    Types: Cigarettes  . Smokeless tobacco: Never Used  Substance Use Topics  . Alcohol use: Yes  . Drug use: Yes    Types: Marijuana, Cocaine    Review of Systems Constitutional: No fever/chills Eyes: No visual changes. ENT: No sore throat. Cardiovascular: Denies chest pain. Respiratory: Denies shortness of breath. Gastrointestinal: No abdominal pain.  No nausea, no vomiting.  No diarrhea.   Genitourinary: Negative for dysuria. Musculoskeletal: Positive for restless leg syndrome.  Skin: Negative for rash. Neurological: Positive for headache.  ____________________________________________   PHYSICAL EXAM:  VITAL SIGNS: ED Triage Vitals  Enc Vitals Group     BP 08/20/18 2155 (!) 170/111     Pulse Rate 08/20/18 2155 (!) 104     Resp 08/20/18 2155 18     Temp 08/20/18 2155 97.6 F (36.4 C)     Temp Source 08/20/18 2155 Oral     SpO2 08/20/18 2155 98 %     Weight 08/20/18 2155 160 lb (72.6 kg)     Height 08/20/18 2155 6' (1.829 m)     Head Circumference --      Peak Flow --      Pain Score 08/20/18 2159 10   Constitutional: Alert and oriented.  Eyes: Conjunctivae are normal.  ENT      Head: Normocephalic and atraumatic.      Nose: No congestion/rhinnorhea.      Mouth/Throat: Mucous membranes are moist.      Neck: No stridor. Hematological/Lymphatic/Immunilogical: No cervical lymphadenopathy. Cardiovascular: Normal  rate, regular rhythm.  No murmurs, rubs, or gallops.  Respiratory: Normal respiratory effort without tachypnea nor retractions. Breath sounds are clear and equal bilaterally. No wheezes/rales/rhonchi. Gastrointestinal: Soft and non tender. No rebound. No guarding.  Genitourinary: Deferred Musculoskeletal: Normal range of motion in all extremities. No lower extremity edema. Neurologic:  Normal speech and language. EOMI. PERRL. Tongue midline. No pronator drift. Grip strength 5/5. Sensation intact. Lower extremity strength 5/5.  No gross focal neurologic deficits are appreciated.  Skin:  Skin is warm, dry and intact. No rash noted. Psychiatric: Mood and affect are normal. Speech and behavior are normal. Patient exhibits appropriate insight and judgment.  ____________________________________________    LABS (pertinent positives/negatives)  None  ____________________________________________   EKG  None  ____________________________________________    RADIOLOGY  None  ____________________________________________   PROCEDURES  Procedures  ____________________________________________   INITIAL IMPRESSION / ASSESSMENT AND PLAN / ED COURSE  Pertinent labs & imaging results that were available during my care of the patient were reviewed by me and considered in my medical decision making (see chart for details).   Patient presents because of concern for headache. Has had headaches in the past and has had neuroimaging without concerning findings. On exam here patient without any focal neuro deficits. Will give patient medication to help manage the headache. At this time do not feel any emergent neuroimaging is necessary given lack of neuro deficits.    ____________________________________________   FINAL CLINICAL IMPRESSION(S) / ED DIAGNOSES  Final diagnoses:  Bad headache     Note: This dictation was prepared with Dragon dictation. Any transcriptional errors that result  from this process are unintentional     Nance Pear, MD 08/21/18 0004

## 2018-08-20 NOTE — ED Triage Notes (Signed)
Pt has right side headache for 2 weeks.  No n/v/d.  pt has photophobia.  Hx HTN.  Pt took 2 clonidine tonight.  Pt alert  Speech clear.  Ambulates without diff.

## 2018-08-21 MED ORDER — PROCHLORPERAZINE MALEATE 10 MG PO TABS: 10.0000 mg | ORAL_TABLET | Freq: Four times a day (QID) | ORAL | 0 refills | Status: AC | PRN

## 2018-08-21 MED ORDER — PROCHLORPERAZINE MALEATE 10 MG PO TABS
10.0000 mg | ORAL_TABLET | Freq: Once | ORAL | Status: AC
  Administered 2018-08-21: 10 mg via ORAL
  Filled 2018-08-21: qty 1

## 2018-08-21 NOTE — ED Provider Notes (Signed)
-----------------------------------------   2:00 AM on 08/21/2018 -----------------------------------------  Delay secondary to computer downtime.  Reassessed patient who is sleeping very soundly.  He is is arousable to voice and states he is overall feeling better.  His spouse tells me that he is still in significant pain.  This contradicts my clinical assessment.  Reportedly he was asking the nurse for Dilaudid by name prior to my shift.  I did discuss with patient and spouse my hesitancy to administer Dilaudid given his seemingly very comfortable state and would not want to depress his respirations.  Will send a prescription for Compazine to his pharmacy.  We will also send a Compazine tablet with the patient should he need it before he is able to pick up his prescription.  Strict return precautions given.  Both verbalized understanding and agree with plan of care.   Paulette Blanch, MD 08/21/18 [phone]

## 2018-08-21 NOTE — Discharge Instructions (Signed)
You may take Compazine as needed for headache/nausea.  Return to the ER for worsening symptoms, persistent vomiting, lethargy or other concerns

## 2018-10-10 ENCOUNTER — Encounter: Admit: 2018-10-10 | Discharge: 2018-10-10 | Payer: PRIVATE HEALTH INSURANCE | Attending: Surgery | Primary: Surgery

## 2018-10-10 ENCOUNTER — Other Ambulatory Visit: Payer: Self-pay

## 2018-10-10 ENCOUNTER — Encounter: Payer: Self-pay | Admitting: Emergency Medicine

## 2018-10-10 ENCOUNTER — Emergency Department
Admission: EM | Admit: 2018-10-10 | Discharge: 2018-10-10 | Disposition: A | Discharge status: Home / Self Care | Payer: BLUE CROSS/BLUE SHIELD | Attending: Student in an Organized Health Care Education/Training Program | Admitting: Student in an Organized Health Care Education/Training Program

## 2018-10-10 DIAGNOSIS — R509 Fever, unspecified: Secondary | ICD-10-CM

## 2018-10-10 DIAGNOSIS — Z1159 Encounter for screening for other viral diseases: Secondary | ICD-10-CM | POA: Diagnosis not present

## 2018-10-10 DIAGNOSIS — Z79899 Other long term (current) drug therapy: Secondary | ICD-10-CM | POA: Diagnosis not present

## 2018-10-10 DIAGNOSIS — Z992 Dependence on renal dialysis: Secondary | ICD-10-CM | POA: Diagnosis not present

## 2018-10-10 DIAGNOSIS — I120 Hypertensive chronic kidney disease with stage 5 chronic kidney disease or end stage renal disease: Secondary | ICD-10-CM | POA: Diagnosis not present

## 2018-10-10 DIAGNOSIS — Z87891 Personal history of nicotine dependence: Secondary | ICD-10-CM | POA: Insufficient documentation

## 2018-10-10 DIAGNOSIS — N186 End stage renal disease: Secondary | ICD-10-CM | POA: Diagnosis not present

## 2018-10-10 LAB — CBC WITH DIFFERENTIAL/PLATELET
Abs Immature Granulocytes: 0.07 10*3/uL (ref 0.00–0.07)
Basophils Absolute: 0 10*3/uL (ref 0.0–0.1)
Basophils Relative: 0 %
Eosinophils Absolute: 0 10*3/uL (ref 0.0–0.5)
Eosinophils Relative: 0 %
HCT: 37.9 % — ABNORMAL LOW (ref 39.0–52.0)
Hemoglobin: 12.3 g/dL — ABNORMAL LOW (ref 13.0–17.0)
Immature Granulocytes: 1 %
Lymphocytes Relative: 4 %
Lymphs Abs: 0.3 10*3/uL — ABNORMAL LOW (ref 0.7–4.0)
MCH: 31 pg (ref 26.0–34.0)
MCHC: 32.5 g/dL (ref 30.0–36.0)
MCV: 95.5 fL (ref 80.0–100.0)
Monocytes Absolute: 0.3 10*3/uL (ref 0.1–1.0)
Monocytes Relative: 3 %
Neutro Abs: 8 10*3/uL — ABNORMAL HIGH (ref 1.7–7.7)
Neutrophils Relative %: 92 %
Platelets: 201 10*3/uL (ref 150–400)
RBC: 3.97 MIL/uL — ABNORMAL LOW (ref 4.22–5.81)
RDW: 15.8 % — ABNORMAL HIGH (ref 11.5–15.5)
WBC: 8.7 10*3/uL (ref 4.0–10.5)
nRBC: 0 % (ref 0.0–0.2)

## 2018-10-10 LAB — COMPREHENSIVE METABOLIC PANEL
ALT: 11 U/L (ref 0–44)
AST: 10 U/L — ABNORMAL LOW (ref 15–41)
Albumin: 3.7 g/dL (ref 3.5–5.0)
Alkaline Phosphatase: 40 U/L (ref 38–126)
Anion gap: 14 (ref 5–15)
BUN: 64 mg/dL — ABNORMAL HIGH (ref 6–20)
CO2: 25 mmol/L (ref 22–32)
Calcium: 8.8 mg/dL — ABNORMAL LOW (ref 8.9–10.3)
Chloride: 100 mmol/L (ref 98–111)
Creatinine, Ser: 14.4 mg/dL — ABNORMAL HIGH (ref 0.61–1.24)
GFR calc Af Amer: 5 mL/min — ABNORMAL LOW (ref >60)
GFR calc non Af Amer: 4 mL/min — ABNORMAL LOW (ref >60)
Glucose, Bld: 118 mg/dL — ABNORMAL HIGH (ref 70–99)
Potassium: 3.4 mmol/L — ABNORMAL LOW (ref 3.5–5.1)
Sodium: 139 mmol/L (ref 135–145)
Total Bilirubin: 0.5 mg/dL (ref 0.3–1.2)
Total Protein: 6.7 g/dL (ref 6.5–8.1)

## 2018-10-10 LAB — SARS CORONAVIRUS 2 BY RT PCR (HOSPITAL ORDER, PERFORMED IN ~~LOC~~ HOSPITAL LAB): SARS Coronavirus 2: NEGATIVE

## 2018-10-10 LAB — ABO/RH: ABO/RH(D): A POS

## 2018-10-10 NOTE — Discharge Instructions (Addendum)
Follow-up with nephrology clinic as directed.  Return for any questions or concerns.

## 2018-10-10 NOTE — ED Provider Notes (Addendum)
[facility] Emergency Department Provider Note    First MD Initiated Contact with Patient 10/10/18 1127     (approximate)  I have reviewed the triage vital signs and the nursing notes.   HISTORY  Chief Complaint Fever    HPI Jeffrey Holloway is a 33 y.o. male extensive past medical history on peritoneal dialysis presents from nephrology clinic for evaluation of fever.  Patient states he feels completely asymptomatic.  Denies any cough congestion, nausea vomiting or diarrhea.  No dysuria.  No change in the dialysate color.  He does travel for work and states he was briefly in contact with someone diagnosed with coronavirus.   Past Medical History:  Diagnosis Date  . Constipation   . Diarrhea   . Glomerulonephritis   . Hemorrhoids   . Kidney infection   . Nausea and vomiting   . Occult blood in stools   . Shortness of breath   . Sleep apnea    Family History  Problem Relation Age of Onset  . Varicose Veins Neg Hx   . Vision loss Neg Hx   . Stroke Neg Hx    Past Surgical History:  Procedure Laterality Date  . APPENDECTOMY    . DIALYSIS/PERMA CATHETER INSERTION N/A 10/15/2017   Procedure: DIALYSIS/PERMA CATHETER INSERTION;  Surgeon: Katha Cabal, MD;  Location: [place] [facility];  Service: Cardiovascular;  Laterality: N/A;  . DIALYSIS/PERMA CATHETER REMOVAL N/A 05/12/2018   Procedure: DIALYSIS/PERMA CATHETER REMOVAL;  Surgeon: Algernon Huxley, MD;  Location: [place] [facility];  Service: Cardiovascular;  Laterality: N/A;   Patient Active Problem List   Diagnosis Date Noted  . SBP (spontaneous bacterial peritonitis) ([place]) 06/25/2018  . HCAP (healthcare-associated pneumonia) 05/30/2018  . Peritonitis ([place]) 05/29/2018  . Abdominal pain 05/24/2018  . Hypertensive urgency, malignant 03/31/2018  . End-stage renal disease on peritoneal dialysis ([place]) 01/22/2018  . Essential hypertension 01/22/2018  . Cellulitis 11/25/2017  . Crescentic  glomerulonephritis 10/20/2017  . GERD (gastroesophageal reflux disease) 10/10/2017  . Rectal foreign body       Prior to Admission medications   Medication Sig Start Date End Date Taking? Authorizing Provider  ALPRAZolam Duanne Moron) 0.25 MG tablet Take 1 tablet by mouth 2 (two) times daily. 05/02/18   [provider]  calcium acetate (PHOSLO) 667 MG capsule Take 2 capsules (1,334 mg total) by mouth 3 (three) times daily with meals. 10/18/17   Epifanio Lesches, MD  calcium carbonate (TUMS - DOSED IN MG ELEMENTAL CALCIUM) 500 MG chewable tablet Chew 1 tablet by mouth 2 (two) times daily.    [provider]  carvedilol (COREG) 6.25 MG tablet Take 1 tablet by mouth 2 (two) times daily. 04/05/18   [provider]  furosemide (LASIX) 80 MG tablet Take 1 tablet by mouth daily. 04/29/18   [provider]  isosorbide mononitrate (IMDUR) 30 MG 24 hr tablet Take 1 tablet (30 mg total) by mouth daily. 04/04/18   Hillary Bow, MD  levofloxacin (LEVAQUIN) 500 MG tablet Take 1 tablet (500 mg total) by mouth every other day. 06/28/18   Mayo, Pete Pelt, MD  losartan (COZAAR) 100 MG tablet Take 1 tablet (100 mg total) by mouth daily. 10/18/17   Epifanio Lesches, MD  multivitamin (RENA-VIT) TABS tablet Take 1 tablet by mouth at bedtime. 10/18/17   Epifanio Lesches, MD  Nutritional Supplements (FEEDING SUPPLEMENT, NEPRO CARB STEADY,) LIQD Take 237 mLs by mouth 2 (two) times daily between meals. 10/18/17   Vianne Bulls,  Snehalatha, MD  omeprazole (PRILOSEC) 20 MG capsule Take 20 mg by mouth daily.    [provider]  ondansetron (ZOFRAN) 4 MG tablet Take 1 tablet (4 mg total) by mouth every 8 (eight) hours as needed for nausea or vomiting. 06/28/18   Mayo, Pete Pelt, MD  oxyCODONE (ROXICODONE) 5 MG immediate release tablet Take 1 tablet (5 mg total) by mouth every 6 (six) hours as needed for severe pain. 06/28/18 06/28/19  Mayo, Pete Pelt, MD  predniSONE (DELTASONE) 20 MG  tablet Take 1 tablet (20 mg total) by mouth daily with breakfast. 06/28/18   Mayo, Pete Pelt, MD  prochlorperazine (COMPAZINE) 10 MG tablet Take 1 tablet (10 mg total) by mouth every 6 (six) hours as needed for nausea (headache). 08/21/18   Paulette Blanch, MD  rOPINIRole (REQUIP) 0.5 MG tablet Take 1 tablet by mouth at bedtime. 03/21/18   [provider]    Allergies Acetaminophen    Social History Social History   Tobacco Use  . Smoking status: Former Smoker    Packs/day: 0.25    Types: Cigarettes  . Smokeless tobacco: Never Used  Substance Use Topics  . Alcohol use: Yes  . Drug use: Yes    Types: Marijuana, Cocaine    Review of Systems Patient denies headaches, rhinorrhea, blurry vision, numbness, shortness of breath, chest pain, edema, cough, abdominal pain, nausea, vomiting, diarrhea, dysuria, fevers, rashes or hallucinations unless otherwise stated above in HPI. ____________________________________________   PHYSICAL EXAM:  VITAL SIGNS: Vitals:   10/10/18 1123 10/10/18 1126  BP:  (!) 152/100  Pulse: 90   Resp: 16   Temp: 98.1 F (36.7 C)   SpO2: 97%     Constitutional: Alert and oriented.  Eyes: Conjunctivae are normal.  Head: Atraumatic. Nose: No congestion/rhinnorhea. Mouth/Throat: Mucous membranes are moist.   Neck: No stridor. Painless ROM.  Cardiovascular: Normal rate, regular rhythm. Grossly normal heart sounds.  Good peripheral circulation. Respiratory: Normal respiratory effort.  No retractions. Lungs CTAB. Gastrointestinal: Soft and nontender. PD site appears c/d/i. No distention. No abdominal bruits. No CVA tenderness. Genitourinary:  Musculoskeletal: No lower extremity tenderness nor edema.  No joint effusions. Neurologic:  Normal speech and language. No gross focal neurologic deficits are appreciated. No facial droop Skin:  Skin is warm, dry and intact. No rash noted. Psychiatric: Mood and affect are normal. Speech and behavior are normal.   ____________________________________________   LABS (all labs ordered are listed, but only abnormal results are displayed)  Results for orders placed or performed during the hospital encounter of 10/10/18 (from the past 24 hour(s))  CBC with Differential/Platelet     Status: Abnormal   Collection Time: 10/10/18 11:55 AM  Result Value Ref Range   WBC 8.7 4.0 - 10.5 K/uL   RBC 3.97 (L) 4.22 - 5.81 MIL/uL   Hemoglobin 12.3 (L) 13.0 - 17.0 g/dL   HCT 37.9 (L) 39.0 - 52.0 %   MCV 95.5 80.0 - 100.0 fL   MCH 31.0 26.0 - 34.0 pg   MCHC 32.5 30.0 - 36.0 g/dL   RDW 15.8 (H) 11.5 - 15.5 %   Platelets 201 150 - 400 K/uL   nRBC 0.0 0.0 - 0.2 %   Neutrophils Relative % 92 %   Neutro Abs 8.0 (H) 1.7 - 7.7 K/uL   Lymphocytes Relative 4 %   Lymphs Abs 0.3 (L) 0.7 - 4.0 K/uL   Monocytes Relative 3 %   Monocytes Absolute 0.3 0.1 - 1.0 K/uL  Eosinophils Relative 0 %   Eosinophils Absolute 0.0 0.0 - 0.5 K/uL   Basophils Relative 0 %   Basophils Absolute 0.0 0.0 - 0.1 K/uL   Immature Granulocytes 1 %   Abs Immature Granulocytes 0.07 0.00 - 0.07 K/uL  Comprehensive metabolic panel     Status: Abnormal   Collection Time: 10/10/18 11:55 AM  Result Value Ref Range   Sodium 139 135 - 145 mmol/L   Potassium 3.4 (L) 3.5 - 5.1 mmol/L   Chloride 100 98 - 111 mmol/L   CO2 25 22 - 32 mmol/L   Glucose, Bld 118 (H) 70 - 99 mg/dL   BUN 64 (H) 6 - 20 mg/dL   Creatinine, Ser 14.40 (H) 0.61 - 1.24 mg/dL   Calcium 8.8 (L) 8.9 - 10.3 mg/dL   Total Protein 6.7 6.5 - 8.1 g/dL   Albumin 3.7 3.5 - 5.0 g/dL   AST 10 (L) 15 - 41 U/L   ALT 11 0 - 44 U/L   Alkaline Phosphatase 40 38 - 126 U/L   Total Bilirubin 0.5 0.3 - 1.2 mg/dL   GFR calc non Af Amer 4 (L) >60 mL/min   GFR calc Af Amer 5 (L) >60 mL/min   Anion gap 14 5 - 15  SARS Coronavirus 2 [facility] order, Performed in [place] hospital lab)     Status: None   Collection Time: 10/10/18 11:55 AM  Result Value Ref Range   SARS Coronavirus 2 NEGATIVE  NEGATIVE  ABO/Rh     Status: None   Collection Time: 10/10/18 11:55 AM  Result Value Ref Range   ABO/RH(D)      A POS Performed at [facility], [address] [place], [place] [address]    ____________________________________________ ____________________________________________  RADIOLOGY   ____________________________________________   PROCEDURES  Procedure(s) performed:  Procedures    Critical Care performed: no ____________________________________________   INITIAL IMPRESSION / ASSESSMENT AND PLAN / ED COURSE  Pertinent labs & imaging results that were available during my care of the patient were reviewed by me and considered in my medical decision making (see chart for details).   DDX: Fever, COVID, sepsis, bacteremia, SBP  Jeffrey Holloway is a 33 y.o. who presents to the ED with symptoms as described above.  He is currently afebrile well-appearing and asymptomatic.  Possibly had a missed reading as it was a temporal thermometer in clinic.  However given his risk factors will test for COVID as well as get blood work to evaluate for any other sirs criteria.  Abdominal exam is soft and benign.  Does not seem clinically consistent with SBP.  Clinical Course as of Oct 10 1334  Fri Oct 10, 2018  1313 COVID test is negative.  Patient stable and appropriate for outpatient follow-up   [PR]    Clinical Course User Index [PR] Merlyn Lot, MD    The patient was evaluated in Emergency Department today for the symptoms described in the history of present illness. He/she was evaluated in the context of the global COVID-19 pandemic, which necessitated consideration that the patient might be at risk for infection with the SARS-CoV-2 virus that causes COVID-19. Institutional protocols and algorithms that pertain to the evaluation of patients at risk for COVID-19 are in a state of rapid change based on information released by regulatory bodies including the CDC and  federal and state organizations. These policies and algorithms were followed during the patient's care in the ED.  As part of my medical  decision making, I reviewed the following data within the [place] notes reviewed and incorporated, Labs reviewed, notes from prior ED visits and [place] Controlled Substance Database   ____________________________________________   FINAL CLINICAL IMPRESSION(S) / ED DIAGNOSES  Final diagnoses:  Fever, unspecified fever cause      NEW MEDICATIONS STARTED DURING THIS VISIT:  New Prescriptions   No medications on file     Note:  This document was prepared using Dragon voice recognition software and may include unintentional dictation errors.    Merlyn Lot, MD 10/10/18 1335    Merlyn Lot, MD 10/10/18 1336

## 2018-10-10 NOTE — ED Triage Notes (Signed)
Pt to ED via POV, pt states that he was sent over by his MD because when he got to his doctors appt he had fever of 100.4. pt denies any other symptoms at this time. Pt is in NAD.

## 2018-10-16 DIAGNOSIS — Z01818 Encounter for other preprocedural examination: Secondary | ICD-10-CM

## 2018-10-16 DIAGNOSIS — N186 End stage renal disease: Secondary | ICD-10-CM

## 2018-10-16 DIAGNOSIS — Z7682 Awaiting organ transplant status: Principal | ICD-10-CM

## 2018-11-24 DIAGNOSIS — Z01818 Encounter for other preprocedural examination: Secondary | ICD-10-CM

## 2018-11-24 DIAGNOSIS — Z7682 Awaiting organ transplant status: Principal | ICD-10-CM

## 2018-11-24 DIAGNOSIS — N186 End stage renal disease: Secondary | ICD-10-CM

## 2018-12-19 ENCOUNTER — Encounter: Admit: 2018-12-19 | Discharge: 2018-12-19 | Payer: PRIVATE HEALTH INSURANCE | Attending: Surgery | Primary: Surgery

## 2018-12-24 DIAGNOSIS — Z01818 Encounter for other preprocedural examination: Secondary | ICD-10-CM

## 2018-12-24 DIAGNOSIS — N186 End stage renal disease: Secondary | ICD-10-CM

## 2018-12-24 DIAGNOSIS — Z7682 Awaiting organ transplant status: Principal | ICD-10-CM

## 2019-01-06 ENCOUNTER — Encounter
Admit: 2019-01-06 | Discharge: 2019-01-06 | Payer: PRIVATE HEALTH INSURANCE | Attending: Nephrology | Primary: Nephrology

## 2019-01-06 DIAGNOSIS — Z01818 Encounter for other preprocedural examination: Principal | ICD-10-CM

## 2019-01-08 ENCOUNTER — Encounter: Admit: 2019-01-08 | Discharge: 2019-01-08 | Payer: PRIVATE HEALTH INSURANCE | Attending: Surgery | Primary: Surgery

## 2019-01-09 ENCOUNTER — Other Ambulatory Visit: Payer: Self-pay

## 2019-01-09 ENCOUNTER — Encounter: Payer: Self-pay | Admitting: Emergency Medicine

## 2019-01-09 ENCOUNTER — Emergency Department
Admission: EM | Admit: 2019-01-09 | Discharge: 2019-01-09 | Disposition: A | Discharge status: Home / Self Care | Payer: BLUE CROSS/BLUE SHIELD | Attending: Emergency Medicine | Admitting: Emergency Medicine

## 2019-01-09 DIAGNOSIS — I120 Hypertensive chronic kidney disease with stage 5 chronic kidney disease or end stage renal disease: Secondary | ICD-10-CM | POA: Diagnosis not present

## 2019-01-09 DIAGNOSIS — D631 Anemia in chronic kidney disease: Secondary | ICD-10-CM | POA: Insufficient documentation

## 2019-01-09 DIAGNOSIS — K2970 Gastritis, unspecified, without bleeding: Secondary | ICD-10-CM

## 2019-01-09 DIAGNOSIS — N186 End stage renal disease: Secondary | ICD-10-CM | POA: Insufficient documentation

## 2019-01-09 DIAGNOSIS — Z87891 Personal history of nicotine dependence: Secondary | ICD-10-CM | POA: Diagnosis not present

## 2019-01-09 DIAGNOSIS — Z79899 Other long term (current) drug therapy: Secondary | ICD-10-CM | POA: Insufficient documentation

## 2019-01-09 DIAGNOSIS — Z992 Dependence on renal dialysis: Secondary | ICD-10-CM | POA: Insufficient documentation

## 2019-01-09 DIAGNOSIS — R51 Headache: Secondary | ICD-10-CM | POA: Insufficient documentation

## 2019-01-09 DIAGNOSIS — R1013 Epigastric pain: Secondary | ICD-10-CM

## 2019-01-09 DIAGNOSIS — R519 Headache, unspecified: Secondary | ICD-10-CM

## 2019-01-09 LAB — TYPE AND SCREEN
ABO/RH(D): A POS
Antibody Screen: NEGATIVE

## 2019-01-09 LAB — COMPREHENSIVE METABOLIC PANEL
ALT: 15 U/L (ref 0–44)
AST: 10 U/L — ABNORMAL LOW (ref 15–41)
Albumin: 4 g/dL (ref 3.5–5.0)
Alkaline Phosphatase: 42 U/L (ref 38–126)
Anion gap: 19 — ABNORMAL HIGH (ref 5–15)
BUN: 64 mg/dL — ABNORMAL HIGH (ref 6–20)
CO2: 24 mmol/L (ref 22–32)
Calcium: 8.5 mg/dL — ABNORMAL LOW (ref 8.9–10.3)
Chloride: 90 mmol/L — ABNORMAL LOW (ref 98–111)
Creatinine, Ser: 16.59 mg/dL — ABNORMAL HIGH (ref 0.61–1.24)
GFR calc Af Amer: 4 mL/min — ABNORMAL LOW (ref >60)
GFR calc non Af Amer: 3 mL/min — ABNORMAL LOW (ref >60)
Glucose, Bld: 110 mg/dL — ABNORMAL HIGH (ref 70–99)
Potassium: 3.5 mmol/L (ref 3.5–5.1)
Sodium: 133 mmol/L — ABNORMAL LOW (ref 135–145)
Total Bilirubin: 0.7 mg/dL (ref 0.3–1.2)
Total Protein: 6.7 g/dL (ref 6.5–8.1)

## 2019-01-09 LAB — CBC
HCT: 30.5 % — ABNORMAL LOW (ref 39.0–52.0)
Hemoglobin: 10.4 g/dL — ABNORMAL LOW (ref 13.0–17.0)
MCH: 29.2 pg (ref 26.0–34.0)
MCHC: 34.1 g/dL (ref 30.0–36.0)
MCV: 85.7 fL (ref 80.0–100.0)
Platelets: 235 10*3/uL (ref 150–400)
RBC: 3.56 MIL/uL — ABNORMAL LOW (ref 4.22–5.81)
RDW: 15.3 % (ref 11.5–15.5)
WBC: 12.6 10*3/uL — ABNORMAL HIGH (ref 4.0–10.5)
nRBC: 0 % (ref 0.0–0.2)

## 2019-01-09 LAB — LIPASE, BLOOD: Lipase: 136 U/L — ABNORMAL HIGH (ref 11–51)

## 2019-01-09 MED ORDER — SUCRALFATE 1 G PO TABS: 1.0000 g | ORAL_TABLET | Freq: Four times a day (QID) | ORAL | 0 refills | Status: AC

## 2019-01-09 MED ORDER — LIDOCAINE VISCOUS HCL 2 % MT SOLN
15.0000 mL | Freq: Once | OROMUCOSAL | Status: AC
  Administered 2019-01-09: 15 mL via OROMUCOSAL
  Filled 2019-01-09: qty 15

## 2019-01-09 MED ORDER — PROCHLORPERAZINE EDISYLATE 10 MG/2ML IJ SOLN
10.0000 mg | Freq: Once | INTRAMUSCULAR | Status: AC
  Administered 2019-01-09: 10 mg via INTRAVENOUS
  Filled 2019-01-09: qty 2

## 2019-01-09 NOTE — ED Notes (Signed)
Pt pacing around room.

## 2019-01-09 NOTE — ED Notes (Signed)
Pt reports he is feeling better"I am ready to check out" Rates pain 6/10 Pt no longer wearing his sunglasses, walking around the room. RN informed Dr. Archie Balboa, informed pt that MD will be in room to talk to him.

## 2019-01-09 NOTE — ED Triage Notes (Signed)
Pt presents to ED c/o vomiting "black-looking liquid" and headache x1 wk. Peritoneal dialysis patient. Denies black or bloody stools. No aspirin or NSAID use. Denies abd pain.

## 2019-01-09 NOTE — ED Notes (Addendum)
Pt reports he does peritoneal dialysis every day has had a headache for past week, saw PCP  today and referred to ER. Pt has photosensitivity. Talks in complete sentences. Reports nausea and vomiting. Denies any history of migraines

## 2019-01-09 NOTE — ED Notes (Signed)
Dr. Goodman at bedside.  

## 2019-01-09 NOTE — Discharge Instructions (Addendum)
Please seek medical attention for any high fevers, chest pain, shortness of breath, change in behavior, persistent vomiting, bloody stool or any other new or concerning symptoms.  

## 2019-01-09 NOTE — ED Provider Notes (Signed)
[facility] Emergency Department Provider Note  ____________________________________________   I have reviewed the triage vital signs and the nursing notes.   HISTORY  Chief Complaint Headache  History limited by: Not Limited   HPI Jeffrey Holloway is a 33 y.o. male who presents to the emergency department today with primary concern for headache. He states that it is located throughout his head. He says that it feels like a high blood pressure type headache. However he says he came to the emergency department today because of PCP suggestion that he not stay home tonight. He went to PCP office with complaint of headache as well as emesis for the past week. The patient says that the emesis has been black in color. Patient states that PCP was worried about possible ulcer. Patient also have some discomfort in the epigastric region.  Records reviewed. Per medical record review patient has a history of occult blood in stools. GERD.  Past Medical History:  Diagnosis Date  . Constipation   . Diarrhea   . Glomerulonephritis   . Hemorrhoids   . Kidney infection   . Nausea and vomiting   . Occult blood in stools   . Shortness of breath   . Sleep apnea     Patient Active Problem List   Diagnosis Date Noted  . SBP (spontaneous bacterial peritonitis) ([place]) 06/25/2018  . HCAP (healthcare-associated pneumonia) 05/30/2018  . Peritonitis ([place]) 05/29/2018  . Abdominal pain 05/24/2018  . Hypertensive urgency, malignant 03/31/2018  . End-stage renal disease on peritoneal dialysis ([place]) 01/22/2018  . Essential hypertension 01/22/2018  . Cellulitis 11/25/2017  . 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 GERD (gastroesophageal reflux disease) 10/10/2017  . Rectal foreign body     Past Surgical History:  Procedure Laterality Date  . APPENDECTOMY    . DIALYSIS/PERMA CATHETER INSERTION N/A 10/15/2017   Procedure: DIALYSIS/PERMA CATHETER INSERTION;  Surgeon: Katha Cabal, MD;  Location: [place] [facility];  Service: Cardiovascular;  Laterality: N/A;  . DIALYSIS/PERMA CATHETER REMOVAL N/A 05/12/2018   Procedure: DIALYSIS/PERMA CATHETER REMOVAL;  Surgeon: Algernon Huxley, MD;  Location: [facility];  Service: Cardiovascular;  Laterality: N/A;    Prior to Admission medications   Medication Sig Start Date End Date Taking? Authorizing Provider  ALPRAZolam Duanne Moron) 0.25 MG tablet Take 1 tablet by mouth 2 (two) times daily. 05/02/18   [provider]  calcium acetate (PHOSLO) 667 MG capsule Take 2 capsules (1,334 mg total) by mouth 3 (three) times daily with meals. 10/18/17   Epifanio Lesches, MD  calcium carbonate (TUMS - DOSED IN MG ELEMENTAL CALCIUM) 500 MG chewable tablet Chew 1 tablet by mouth 2 (two) times daily.    [provider]  carvedilol (COREG) 6.25 MG tablet Take 1 tablet by mouth 2 (two) times daily. 04/05/18   [provider]  furosemide (LASIX) 80 MG tablet Take 1 tablet by mouth daily. 04/29/18   [provider]  isosorbide mononitrate (IMDUR) 30 MG 24 hr tablet Take 1 tablet (30 mg total) by mouth daily. 04/04/18   Hillary Bow, MD  levofloxacin (LEVAQUIN) 500 MG tablet Take 1 tablet (500 mg total) by mouth every other day. 06/28/18   Mayo, Pete Pelt, MD  losartan (COZAAR) 100 MG tablet Take 1 tablet (100 mg total) by mouth daily. 10/18/17   Epifanio Lesches, MD  multivitamin (RENA-VIT) TABS tablet Take 1 tablet by mouth at bedtime. 10/18/17   Epifanio Lesches, MD  Nutritional Supplements (FEEDING SUPPLEMENT, NEPRO CARB  STEADY,) LIQD Take 237 mLs by mouth 2 (two) times daily between meals. 10/18/17   Epifanio Lesches, MD  omeprazole (PRILOSEC) 20 MG capsule Take 20 mg by mouth daily.    [provider]  ondansetron (ZOFRAN) 4 MG tablet Take 1 tablet (4 mg total) by mouth every 8 (eight) hours as needed for nausea or vomiting. 06/28/18   Mayo, Pete Pelt, MD  oxyCODONE (ROXICODONE) 5  MG immediate release tablet Take 1 tablet (5 mg total) by mouth every 6 (six) hours as needed for severe pain. 06/28/18 06/28/19  Mayo, Pete Pelt, MD  predniSONE (DELTASONE) 20 MG tablet Take 1 tablet (20 mg total) by mouth daily with breakfast. 06/28/18   Mayo, Pete Pelt, MD  prochlorperazine (COMPAZINE) 10 MG tablet Take 1 tablet (10 mg total) by mouth every 6 (six) hours as needed for nausea (headache). 08/21/18   Paulette Blanch, MD  rOPINIRole (REQUIP) 0.5 MG tablet Take 1 tablet by mouth at bedtime. 03/21/18   [provider]    Allergies Acetaminophen  Family History  Problem Relation Age of Onset  . Varicose Veins Neg Hx   . Vision loss Neg Hx   . Stroke Neg Hx     Social History Social History   Tobacco Use  . Smoking status: Former Smoker    Packs/day: 0.25    Types: Cigarettes  . Smokeless tobacco: Never Used  Substance Use Topics  . Alcohol use: Yes  . Drug use: Yes    Types: Marijuana, Cocaine    Review of Systems Constitutional: No fever/chills Eyes: No visual changes. ENT: No sore throat. Cardiovascular: Denies chest pain. Respiratory: Denies shortness of breath. Gastrointestinal: Positive for epigastric pain.  Genitourinary: Negative for dysuria. Musculoskeletal: Negative for back pain. Skin: Negative for rash. Neurological: Positive for headache.  ____________________________________________   PHYSICAL EXAM:  VITAL SIGNS: ED Triage Vitals [01/09/19 1608]  Enc Vitals Group     BP (!) 166/107     Pulse Rate 80     Resp 18     Temp 98.6 F (37 C)     Temp Source Oral     SpO2 97 %     Weight 154 lb (69.9 kg)     Height 6' (1.829 m)     Head Circumference      Peak Flow      Pain Score 9   Constitutional: Alert and oriented.  Eyes: Conjunctivae are normal.  ENT      Head: Normocephalic and atraumatic.      Nose: No congestion/rhinnorhea.      Mouth/Throat: Mucous membranes are moist.      Neck: No  stridor. Hematological/Lymphatic/Immunilogical: No cervical lymphadenopathy. Cardiovascular: Normal rate, regular rhythm.  No murmurs, rubs, or gallops.  Respiratory: Normal respiratory effort without tachypnea nor retractions. Breath sounds are clear and equal bilaterally. No wheezes/rales/rhonchi. Gastrointestinal: Soft and non tender. No rebound. No guarding. Peritoneal dialysis catheter in place.  Genitourinary: Deferred Musculoskeletal: Normal range of motion in all extremities. No lower extremity edema. Neurologic:  Normal speech and language. No gross focal neurologic deficits are appreciated.  Skin:  Skin is warm, dry and intact. No rash noted. Psychiatric: Mood and affect are normal. Speech and behavior are normal. Patient exhibits appropriate insight and judgment.  ____________________________________________    LABS (pertinent positives/negatives)  Lipase 136 CMP na 133, k 3.5, cl 90, glu 110, cr 16.59 CBC wbc 12.6, hgb 10.4, plt 235  ____________________________________________   EKG  None  ____________________________________________  RADIOLOGY  None  ____________________________________________   PROCEDURES  Procedures  ____________________________________________   INITIAL IMPRESSION / ASSESSMENT AND PLAN / ED COURSE  Pertinent labs & imaging results that were available during my care of the patient were reviewed by me and considered in my medical decision making (see chart for details).   Patient presented to the emergency department today because of concern for headache and possible hematemesis. Patient without any vomiting in the emergency department. He did complain of some epigastric pain but this did improve after viscous lidocaine. At this time do think likely gastritis cause of pain and possible hematemesis. Lipase was elevated however it appears patient has chronic elevation of lipase. No tachycardia or hypotension to suggest significant blood  loss. The patient also had complaint of headache and did feel better after medication. Did request discharge home which I do think is reasonable at this time. Discussed with patient importance of follow up with GI.    ____________________________________________   FINAL CLINICAL IMPRESSION(S) / ED DIAGNOSES  Final diagnoses:  Epigastric pain  Gastritis, presence of bleeding unspecified, unspecified chronicity, unspecified gastritis type  Bad headache     Note: This dictation was prepared with Dragon dictation. Any transcriptional errors that result from this process are unintentional     Nance Pear, MD 01/09/19 2049

## 2019-01-15 DIAGNOSIS — Z01818 Encounter for other preprocedural examination: Secondary | ICD-10-CM

## 2019-01-15 DIAGNOSIS — Z7682 Awaiting organ transplant status: Principal | ICD-10-CM

## 2019-01-15 DIAGNOSIS — N186 End stage renal disease: Secondary | ICD-10-CM

## 2019-01-22 ENCOUNTER — Ambulatory Visit: Payer: BLUE CROSS/BLUE SHIELD | Admitting: Gastroenterology

## 2019-01-22 ENCOUNTER — Other Ambulatory Visit: Payer: Self-pay

## 2019-01-22 ENCOUNTER — Encounter: Payer: Self-pay | Admitting: Gastroenterology

## 2019-01-28 ENCOUNTER — Ambulatory Visit: Payer: BLUE CROSS/BLUE SHIELD | Admitting: Gastroenterology

## 2019-01-28 ENCOUNTER — Other Ambulatory Visit: Payer: Self-pay

## 2019-01-28 ENCOUNTER — Encounter: Payer: Self-pay | Admitting: Gastroenterology

## 2019-01-28 VITALS — BP 112/69 | HR 62 | Temp 99.2°F | Ht 72.0 in | Wt 162.2 lb

## 2019-01-28 DIAGNOSIS — K5900 Constipation, unspecified: Secondary | ICD-10-CM

## 2019-01-28 DIAGNOSIS — D649 Anemia, unspecified: Secondary | ICD-10-CM | POA: Diagnosis not present

## 2019-01-28 MED ORDER — POLYETHYLENE GLYCOL 3350 17 G PO PACK: 34.0000 g | PACK | Freq: Every day | ORAL | 0 refills | Status: AC

## 2019-01-28 NOTE — Patient Instructions (Signed)

## 2019-01-29 LAB — IRON,TIBC AND FERRITIN PANEL
Ferritin: 361 ng/mL (ref 30–400)
Iron Saturation: 27 % (ref 15–55)
Iron: 54 ug/dL (ref 38–169)
Total Iron Binding Capacity: 198 ug/dL — ABNORMAL LOW (ref 250–450)
UIBC: 144 ug/dL (ref 111–343)

## 2019-01-30 NOTE — Progress Notes (Signed)
Jeffrey Holloway [address]  [place]  [place], [place] [address]  Main: [phone]  Fax: [phone]   Gastroenterology Consultation  Referring Provider:     Anthonette Legato, MD Primary Care Physician:  Ellene Route Reason for Consultation:   Abdominal pain        HPI:    Chief Complaint  Patient presents with  . Abdominal Pain    Patient has had sharp pains with bowel movements. Patient states he has been having constipation. He will go 2 days with out a bowel movements and then will go 2-3 times. with bowel movements he will have shaking, nausea and vomiting.     Jeffrey Holloway is a 33 y.o. y/o male referred for consultation & management  by Dr. Emelda Brothers, Jefm Bryant.  Patient presents with constipation and reports sharp pain throughout his abdomen right before a bowel movement.  Denies any rectal or anal pain while having a bowel movement.  No blood in stool.  No weight loss.  Is also accompanied with emesis at times when he has this pain.  No family history of colon cancer.  No EGD or colonoscopy in the past.  Past Medical History:  Diagnosis Date  . Constipation   . Diarrhea   . Glomerulonephritis   . Hemorrhoids   . Kidney infection   . Nausea and vomiting   . Occult blood in stools   . Shortness of breath   . Sleep apnea     Past Surgical History:  Procedure Laterality Date  . APPENDECTOMY    . DIALYSIS/PERMA CATHETER INSERTION N/A 10/15/2017   Procedure: DIALYSIS/PERMA CATHETER INSERTION;  Surgeon: Katha Cabal, MD;  Location: [place] [facility];  Service: Cardiovascular;  Laterality: N/A;  . DIALYSIS/PERMA CATHETER REMOVAL N/A 05/12/2018   Procedure: DIALYSIS/PERMA CATHETER REMOVAL;  Surgeon: Algernon Huxley, MD;  Location: [place];  Service: Cardiovascular;  Laterality: N/A;    Prior to Admission medications   Medication Sig Start Date End Date Taking? Authorizing Provider  ALPRAZolam Duanne Moron) 0.5 MG tablet  01/15/19   Yes [provider]  amLODipine (NORVASC) 10 MG tablet TK 1 T PO D 12/19/18  Yes [provider]  atenolol (TENORMIN) 50 MG tablet Take by mouth. 01/09/19 01/09/20 Yes [provider]  calcium acetate (PHOSLO) 667 MG capsule Take 2 capsules (1,334 mg total) by mouth 3 (three) times daily with meals. 10/18/17  Yes Epifanio Lesches, MD  calcium carbonate (TUMS - DOSED IN MG ELEMENTAL CALCIUM) 500 MG chewable tablet Chew 1 tablet by mouth 2 (two) times daily.   Yes [provider]  cloNIDine (CATAPRES) 0.2 MG tablet TK 1 T PO TID 11/01/18  Yes [provider]  diphenhydrAMINE (BENADRYL) 25 mg capsule Take by mouth.   Yes [provider]  escitalopram (LEXAPRO) 10 MG tablet  01/15/19  Yes [provider]  ferric citrate (AURYXIA) 1 GM 210 MG(Fe) tablet Take by mouth.   Yes [provider]  furosemide (LASIX) 80 MG tablet Take 1 tablet by mouth daily. 04/29/18  Yes [provider]  hydrALAZINE (APRESOLINE) 100 MG tablet Take by mouth.   Yes [provider]  hydrALAZINE (APRESOLINE) 50 MG tablet Take by mouth. 01/08/18  Yes [provider]  isosorbide mononitrate (IMDUR) 30 MG 24 hr tablet Take 1 tablet (30 mg total) by mouth daily. 04/04/18  Yes Sudini, Alveta Heimlich, MD  losartan (COZAAR) 100 MG tablet Take 1 tablet (100 mg total) by mouth  daily. 10/18/17  Yes Epifanio Lesches, MD  metoprolol tartrate (LOPRESSOR) 50 MG tablet Take by mouth. 04/04/18  Yes [provider]  minoxidil (LONITEN) 10 MG tablet Take by mouth. 05/20/18  Yes [provider]  multivitamin (RENA-VIT) TABS tablet Take 1 tablet by mouth at bedtime. 10/18/17  Yes Epifanio Lesches, MD  Nutritional Supplements (FEEDING SUPPLEMENT, NEPRO CARB STEADY,) LIQD Take 237 mLs by mouth 2 (two) times daily between meals. 10/18/17  Yes Epifanio Lesches, MD  omeprazole (PRILOSEC) 20 MG capsule Take 20 mg by mouth daily.   Yes [provider]  ondansetron (ZOFRAN) 4 MG tablet Take 1 tablet (4 mg total) by mouth every 8 (eight) hours as needed for nausea or vomiting. 06/28/18  Yes Mayo, Pete Pelt, MD  predniSONE (DELTASONE) 20 MG tablet Take 1 tablet (20 mg total) by mouth daily with breakfast. 06/28/18  Yes Mayo, Pete Pelt, MD  prochlorperazine (COMPAZINE) 10 MG tablet Take 1 tablet (10 mg total) by mouth every 6 (six) hours as needed for nausea (headache). 08/21/18  Yes Paulette Blanch, MD  rOPINIRole (REQUIP) 0.5 MG tablet Take 1 tablet by mouth at bedtime. 03/21/18  Yes [provider]  sucralfate (CARAFATE) 1 g tablet Take 1 tablet (1 g total) by mouth 4 (four) times daily. 01/09/19  Yes Nance Pear, MD  ALPRAZolam Duanne Moron) 0.25 MG tablet Take 1 tablet by mouth 2 (two) times daily. 05/02/18   [provider]  carvedilol (COREG) 6.25 MG tablet Take 1 tablet by mouth 2 (two) times daily. 04/05/18   [provider]  gentamicin cream (GARAMYCIN) 0.1 % APP TO EXIT SITE QD 01/14/19   [provider]  levofloxacin (LEVAQUIN) 500 MG tablet Take 1 tablet (500 mg total) by mouth every other day. Patient not taking: Reported on 01/28/2019 06/28/18   Mayo, Pete Pelt, MD  oxyCODONE (ROXICODONE) 5 MG immediate release tablet Take 1 tablet (5 mg total) by mouth every 6 (six) hours as needed for severe pain. Patient not taking: Reported on 01/28/2019 06/28/18 06/28/19  MayoPete Pelt, MD  polyethylene glycol (MIRALAX) 17 g packet Take 34 g by mouth daily. 01/28/19 02/27/19  Virgel Manifold, MD    Family History  Problem Relation Age of Onset  . Varicose Veins Neg Hx   . Vision loss Neg Hx   . Stroke Neg Hx      Social History   Tobacco Use  . Smoking status: Former Smoker    Packs/day: 0.25    Types: Cigarettes  . Smokeless tobacco: Never Used  Substance Use Topics  . Alcohol use: Yes  . Drug use: Yes    Types: Marijuana, Cocaine    Allergies as of 01/28/2019 - Review Complete 01/28/2019   Allergen Reaction Noted  . Acetaminophen Nausea And Vomiting 06/18/2018    Review of Systems:    All systems reviewed and negative except where noted in HPI.   Physical Exam:  BP 112/69 (BP Location: Left Arm, Patient Position: Sitting, Cuff Size: Normal)   Pulse 62   Temp 99.2 F (37.3 C) (Temporal)   Ht 6' (1.829 m)   Wt 162 lb 4 oz (73.6 kg)   BMI 22.01 kg/m  No LMP for male patient. Psych:  Alert and cooperative. Normal mood and affect. General:   Alert,  Well-developed, well-nourished, pleasant and cooperative in NAD Head:  Normocephalic and atraumatic. Eyes:  Sclera clear, no icterus.   Conjunctiva pink. Ears:  Normal auditory acuity. Nose:  No  deformity, discharge, or lesions. Mouth:  No deformity or lesions,oropharynx pink & moist. Neck:  Supple; no masses or thyromegaly. Abdomen:  Normal bowel sounds.  No bruits.  Soft, non-tender and non-distended without masses, hepatosplenomegaly or hernias noted.  No guarding or rebound tenderness.    Msk:  Symmetrical without gross deformities. Good, equal movement & strength bilaterally. Pulses:  Normal pulses noted. Extremities:  No clubbing or edema.  No cyanosis. Neurologic:  Alert and oriented x3;  grossly normal neurologically. Skin:  Intact without significant lesions or rashes. No jaundice. Lymph Nodes:  No significant cervical adenopathy. Psych:  Alert and cooperative. Normal mood and affect.   Labs: CBC    Component Value Date/Time   WBC 12.6 (H) 01/09/2019 1612   RBC 3.56 (L) 01/09/2019 1612   HGB 10.4 (L) 01/09/2019 1612   HGB 12.1 (L) 01/19/2012 0550   HCT 30.5 (L) 01/09/2019 1612   HCT 35.3 (L) 01/19/2012 0550   PLT 235 01/09/2019 1612   PLT 152 01/19/2012 0550   MCV 85.7 01/09/2019 1612   MCV 95 01/19/2012 0550   MCH 29.2 01/09/2019 1612   MCHC 34.1 01/09/2019 1612   RDW 15.3 01/09/2019 1612   RDW 12.4 01/19/2012 0550   LYMPHSABS 0.3 (L) 10/10/2018 1155   LYMPHSABS 2.1 01/19/2012 0550   MONOABS 0.3  10/10/2018 1155   MONOABS 0.9 01/19/2012 0550   EOSABS 0.0 10/10/2018 1155   EOSABS 0.1 01/19/2012 0550   BASOSABS 0.0 10/10/2018 1155   BASOSABS 0.0 01/19/2012 0550   CMP     Component Value Date/Time   NA 133 (L) 01/09/2019 1612   NA 134 (L) 01/17/2012 0126   K 3.5 01/09/2019 1612   K 3.7 01/17/2012 0126   CL 90 (L) 01/09/2019 1612   CL 100 01/17/2012 0126   CO2 24 01/09/2019 1612   CO2 27 01/17/2012 0126   GLUCOSE 110 (H) 01/09/2019 1612   GLUCOSE 98 01/17/2012 0126   BUN 64 (H) 01/09/2019 1612   BUN 7 01/17/2012 0126   CREATININE 16.59 (H) 01/09/2019 1612   CREATININE 0.94 01/17/2012 0126   CALCIUM 8.5 (L) 01/09/2019 1612   CALCIUM 7.1 (L) 10/10/2017 1400   PROT 6.7 01/09/2019 1612   PROT 9.3 (H) 01/17/2012 0126   ALBUMIN 4.0 01/09/2019 1612   ALBUMIN 4.7 01/17/2012 0126   AST 10 (L) 01/09/2019 1612   AST 21 01/17/2012 0126   ALT 15 01/09/2019 1612   ALT 24 01/17/2012 0126   ALKPHOS 42 01/09/2019 1612   ALKPHOS 89 01/17/2012 0126   BILITOT 0.7 01/09/2019 1612   BILITOT 0.8 01/17/2012 0126   GFRNONAA 3 (L) 01/09/2019 1612   GFRNONAA >60 01/17/2012 0126   GFRAA 4 (L) 01/09/2019 1612   GFRAA >60 01/17/2012 0126    Imaging Studies: No results found.  Assessment and Plan:   MAMON MONTANEZ is a 33 y.o. y/o male has been referred for constipation, accompanied with abdominal pain prior to bowel movements  Patient does not eat a high-fiber diet Start MiraLAX 34 g once daily and decrease if loose bowel movements  High-fiber diet MiraLAX or Metamucil daily with goal of 1-2 soft bowel movements daily.  If not at goal, patient instructed to increase dose to twice daily.  If loose stools with the medication, patient asked to decrease the medication to every other day, or half dose daily.  Patient verbalized understanding  Patient was noted to be anemic and when I ordered iron panel, he does not have  iron deficiency.  Follow with PCP closely for further anemia work-up  to evaluate etiology of anemia and consider referral to hematology as determined by PCP  If symptoms do not resolve, change in therapy or colonoscopy can be considered next Follow-up in clinic closely    Dr Jeffrey Holloway  Speech recognition software was used to dictate the above note.

## 2019-02-03 ENCOUNTER — Telehealth: Payer: Self-pay

## 2019-02-03 NOTE — Telephone Encounter (Signed)
-----   Message from Virgel Manifold, MD sent at 02/03/2019 11:33 AM EDT ----- Caryl Pina please let the patient know, his blood work does not show iron deficiency.  However, he should follow-up with his primary care doctor to get work-up for anemia, and they can consider referral to a hematologist if needed

## 2019-02-03 NOTE — Telephone Encounter (Signed)
Patient verbalized understanding of lab results and will follow up with PCP

## 2019-02-04 ENCOUNTER — Encounter: Admit: 2019-02-04 | Discharge: 2019-02-04 | Payer: PRIVATE HEALTH INSURANCE | Attending: Surgery | Primary: Surgery

## 2019-02-11 DIAGNOSIS — N186 End stage renal disease: Secondary | ICD-10-CM

## 2019-02-11 DIAGNOSIS — Z01818 Encounter for other preprocedural examination: Secondary | ICD-10-CM

## 2019-02-11 DIAGNOSIS — Z7682 Awaiting organ transplant status: Secondary | ICD-10-CM

## 2019-02-19 ENCOUNTER — Ambulatory Visit: Payer: BLUE CROSS/BLUE SHIELD | Admitting: Gastroenterology

## 2019-03-05 DIAGNOSIS — Z01818 Encounter for other preprocedural examination: Secondary | ICD-10-CM

## 2019-03-05 DIAGNOSIS — N186 End stage renal disease: Secondary | ICD-10-CM

## 2019-03-05 DIAGNOSIS — I776 Arteritis, unspecified: Secondary | ICD-10-CM

## 2019-03-05 DIAGNOSIS — Z01810 Encounter for preprocedural cardiovascular examination: Secondary | ICD-10-CM

## 2019-03-10 ENCOUNTER — Telehealth: Payer: Self-pay

## 2019-03-10 ENCOUNTER — Other Ambulatory Visit: Payer: Self-pay

## 2019-03-10 DIAGNOSIS — K625 Hemorrhage of anus and rectum: Secondary | ICD-10-CM

## 2019-03-10 NOTE — Telephone Encounter (Signed)
Patients wife contacted the office states her husband is experiencing Bright red rectal bleed, lightly for the past 2-3 days.  Blood is present in the toilet.  Symptoms present: little bit of constipation, no abdominal pain.  Patient of Dr. Michele Mcalpine his f/u appt is on 03/19/19 at 2pm with her.  Please advise.  Thanks,  Sharyn Lull

## 2019-03-10 NOTE — Telephone Encounter (Signed)
1. Get CBC , will need to have a telephone or office visit after the CBC, if bleeding is severe then needs to go to ER

## 2019-03-10 NOTE — Telephone Encounter (Signed)
Patient has been advised to come into our office to have a CBC drawn, and we will schedule a telephone or office visit after the CBC.  Patient has been advised  if bleeding is severe then needs to go to ER.  Thanks [employer]

## 2019-03-11 ENCOUNTER — Telehealth: Payer: Self-pay

## 2019-03-11 LAB — CBC WITH DIFFERENTIAL/PLATELET
Basophils Absolute: 0 10*3/uL (ref 0.0–0.2)
Basos: 0 %
EOS (ABSOLUTE): 0 10*3/uL (ref 0.0–0.4)
Eos: 0 %
Hematocrit: 31.7 % — ABNORMAL LOW (ref 37.5–51.0)
Hemoglobin: 10.9 g/dL — ABNORMAL LOW (ref 13.0–17.7)
Immature Grans (Abs): 0 10*3/uL (ref 0.0–0.1)
Immature Granulocytes: 0 %
Lymphocytes Absolute: 0.9 10*3/uL (ref 0.7–3.1)
Lymphs: 12 %
MCH: 32 pg (ref 26.6–33.0)
MCHC: 34.4 g/dL (ref 31.5–35.7)
MCV: 93 fL (ref 79–97)
Monocytes Absolute: 0.2 10*3/uL (ref 0.1–0.9)
Monocytes: 3 %
Neutrophils Absolute: 6.1 10*3/uL (ref 1.4–7.0)
Neutrophils: 85 %
Platelets: 174 10*3/uL (ref 150–450)
RBC: 3.41 x10E6/uL — ABNORMAL LOW (ref 4.14–5.80)
RDW: 13.7 % (ref 11.6–15.4)
WBC: 7.2 10*3/uL (ref 3.4–10.8)

## 2019-03-11 NOTE — Telephone Encounter (Signed)
Patient notified his hgb is slightly better than it was two months ago.  He's been advised to try sitz baths, stool softeners, and avoid rubbing the perianal area.  Also informed that he may likely need a colonoscopy and Dr. Bonna Gains will discuss at his follow up visit next week.  Thanks [employer]

## 2019-03-11 NOTE — Progress Notes (Signed)
Hemoglobin is actually slightly better than what it was 2 months back.  Definitely no drop.  Informed to try sits baths, stool softeners and avoid rubbing in the perianal area.  He will very likely require a colonoscopy

## 2019-03-11 NOTE — Telephone Encounter (Signed)
-----   Message from Jonathon Bellows, MD sent at 03/11/2019  8:35 AM EDT ----- Hemoglobin is actually slightly better than what it was 2 months back.  Definitely no drop.  Informed to try sits baths, stool softeners and avoid rubbing in the perianal area.  He will very likely require a colonoscopy

## 2019-03-19 ENCOUNTER — Ambulatory Visit: Payer: BLUE CROSS/BLUE SHIELD | Admitting: Gastroenterology

## 2019-03-23 ENCOUNTER — Telehealth
Admit: 2019-03-23 | Discharge: 2019-03-24 | Payer: PRIVATE HEALTH INSURANCE | Attending: Health Service | Primary: Health Service

## 2019-03-23 ENCOUNTER — Other Ambulatory Visit: Admit: 2019-03-23 | Discharge: 2019-03-24 | Payer: PRIVATE HEALTH INSURANCE

## 2019-03-24 ENCOUNTER — Encounter: Admit: 2019-03-24 | Discharge: 2019-03-24 | Payer: PRIVATE HEALTH INSURANCE | Attending: Surgery | Primary: Surgery

## 2019-03-26 DIAGNOSIS — Z01818 Encounter for other preprocedural examination: Principal | ICD-10-CM

## 2019-03-27 ENCOUNTER — Encounter: Admit: 2019-03-27 | Discharge: 2019-03-28 | Payer: PRIVATE HEALTH INSURANCE

## 2019-03-27 ENCOUNTER — Ambulatory Visit: Admit: 2019-03-27 | Discharge: 2019-03-28 | Payer: PRIVATE HEALTH INSURANCE

## 2019-03-31 ENCOUNTER — Telehealth: Payer: Self-pay | Admitting: Gastroenterology

## 2019-03-31 DIAGNOSIS — N186 End stage renal disease: Principal | ICD-10-CM

## 2019-03-31 DIAGNOSIS — Z7682 Awaiting organ transplant status: Principal | ICD-10-CM

## 2019-03-31 DIAGNOSIS — Z01818 Encounter for other preprocedural examination: Principal | ICD-10-CM

## 2019-03-31 NOTE — Telephone Encounter (Signed)
Left vm  For pt to call office and schedule apt to see  Dr. Bonna Gains

## 2019-03-31 NOTE — Telephone Encounter (Signed)
-----   Message from Virgel Manifold, MD sent at 03/31/2019  1:09 PM EDT ----- Please set up clinic follow up with me next available ----- Message ----- From: Jonathon Bellows, MD Sent: 03/11/2019   8:35 AM EDT To: Vanetta Mulders, CMA, #  Hemoglobin is actually slightly better than what it was 2 months back.  Definitely no drop.  Informed to try sits baths, stool softeners and avoid rubbing in the perianal area.  He will very likely require a colonoscopy

## 2019-04-02 ENCOUNTER — Encounter: Payer: Self-pay | Admitting: Gastroenterology

## 2019-04-02 ENCOUNTER — Telehealth: Payer: Self-pay | Admitting: Gastroenterology

## 2019-04-02 NOTE — Telephone Encounter (Signed)
Left vm to offer apt per Dr. Mcneil Sober Note . Letter sent

## 2019-04-02 NOTE — Telephone Encounter (Signed)
-----   Message from Virgel Manifold, MD sent at 03/31/2019  1:09 PM EDT ----- Please set up clinic follow up with me next available ----- Message ----- From: Jonathon Bellows, MD Sent: 03/11/2019   8:35 AM EDT To: Vanetta Mulders, CMA, #  Hemoglobin is actually slightly better than what it was 2 months back.  Definitely no drop.  Informed to try sits baths, stool softeners and avoid rubbing in the perianal area.  He will very likely require a colonoscopy

## 2019-04-10 ENCOUNTER — Encounter: Admit: 2019-04-10 | Discharge: 2019-04-10 | Payer: PRIVATE HEALTH INSURANCE | Attending: Surgery | Primary: Surgery

## 2019-04-14 DIAGNOSIS — N186 End stage renal disease: Principal | ICD-10-CM

## 2019-04-14 DIAGNOSIS — Z01818 Encounter for other preprocedural examination: Principal | ICD-10-CM

## 2019-04-14 DIAGNOSIS — Z7682 Awaiting organ transplant status: Principal | ICD-10-CM

## 2019-04-14 DIAGNOSIS — Z01810 Encounter for preprocedural cardiovascular examination: Principal | ICD-10-CM

## 2019-04-16 ENCOUNTER — Encounter: Admit: 2019-04-16 | Discharge: 2019-04-17 | Payer: PRIVATE HEALTH INSURANCE

## 2019-04-16 ENCOUNTER — Encounter
Admit: 2019-04-16 | Discharge: 2019-04-17 | Payer: PRIVATE HEALTH INSURANCE | Attending: Student in an Organized Health Care Education/Training Program | Primary: Student in an Organized Health Care Education/Training Program

## 2019-04-16 DIAGNOSIS — Z7682 Awaiting organ transplant status: Principal | ICD-10-CM

## 2019-04-16 DIAGNOSIS — Z01810 Encounter for preprocedural cardiovascular examination: Principal | ICD-10-CM

## 2019-04-16 DIAGNOSIS — Z01818 Encounter for other preprocedural examination: Principal | ICD-10-CM

## 2019-04-16 DIAGNOSIS — N186 End stage renal disease: Principal | ICD-10-CM

## 2019-04-16 MED ORDER — PEG 3350-ELECTROLYTES 236 GRAM-22.74 GRAM-6.74 GRAM-5.86 GRAM SOLUTION
Freq: Once | ORAL | 0 refills | 0.00000 days | Status: CP
Start: 2019-04-16 — End: 2019-04-16

## 2019-04-17 ENCOUNTER — Encounter: Admit: 2019-04-17 | Discharge: 2019-04-18 | Payer: PRIVATE HEALTH INSURANCE

## 2019-04-25 ENCOUNTER — Ambulatory Visit: Admit: 2019-04-25 | Discharge: 2019-04-26 | Attending: Nurse Practitioner | Primary: Nurse Practitioner

## 2019-04-28 ENCOUNTER — Encounter
Admit: 2019-04-28 | Discharge: 2019-05-02 | Disposition: A | Discharge status: Home / Self Care | Payer: PRIVATE HEALTH INSURANCE | Attending: Student in an Organized Health Care Education/Training Program | Admitting: Student in an Organized Health Care Education/Training Program

## 2019-04-28 ENCOUNTER — Ambulatory Visit
Admit: 2019-04-28 | Discharge: 2019-05-02 | Disposition: A | Discharge status: Home / Self Care | Payer: PRIVATE HEALTH INSURANCE | Admitting: Student in an Organized Health Care Education/Training Program

## 2019-04-29 MED ORDER — MYFORTIC 180 MG TABLET,DELAYED RELEASE
ORAL_TABLET | Freq: Two times a day (BID) | ORAL | 11 refills | 30 days | Status: CP
Start: 2019-04-29 — End: 2020-04-28

## 2019-04-29 MED ORDER — VALGANCICLOVIR 450 MG TABLET
ORAL_TABLET | Freq: Every day | ORAL | 2 refills | 30.00000 days | Status: CP
Start: 2019-04-29 — End: 2019-07-28
  Filled 2019-05-01: qty 30, 30d supply, fill #0

## 2019-04-29 MED ORDER — MG-PLUS-PROTEIN 133 MG TABLET
ORAL_TABLET | Freq: Two times a day (BID) | ORAL | 11 refills | 0.00000 days | Status: CP
Start: 2019-04-29 — End: ?
  Filled 2019-05-01: qty 100, 50d supply, fill #0

## 2019-04-29 MED ORDER — TACROLIMUS ER 4 MG TABLET,EXTENDED RELEASE 24 HR
ORAL_TABLET | Freq: Every day | ORAL | 11 refills | 30.00000 days | Status: CP
Start: 2019-04-29 — End: 2020-04-28

## 2019-04-29 MED ORDER — TACROLIMUS ER 1 MG TABLET,EXTENDED RELEASE 24 HR
ORAL_TABLET | Freq: Every day | ORAL | 11 refills | 30 days | Status: CP
Start: 2019-04-29 — End: 2020-04-28
  Filled 2019-05-01: qty 90, 30d supply, fill #0

## 2019-04-29 MED ORDER — MYCOPHENOLATE SODIUM 180 MG TABLET,DELAYED RELEASE
ORAL_TABLET | Freq: Two times a day (BID) | ORAL | 11 refills | 30.00000 days | Status: CP
Start: 2019-04-29 — End: 2019-04-29
  Filled 2019-05-01: qty 180, 30d supply, fill #0

## 2019-04-29 MED ORDER — GABAPENTIN 100 MG CAPSULE
ORAL_CAPSULE | Freq: Every evening | ORAL | 0 refills | 12 days | Status: CP
Start: 2019-04-29 — End: 2019-05-11
  Filled 2019-05-01: qty 12, 12d supply, fill #0

## 2019-04-29 MED ORDER — POLYETHYLENE GLYCOL 3350 17 GRAM/DOSE ORAL POWDER
Freq: Every day | ORAL | 0 refills | 30.00000 days | Status: CP | PRN
Start: 2019-04-29 — End: 2019-05-29
  Filled 2019-05-01: qty 510, 30d supply, fill #0

## 2019-04-29 MED ORDER — HYDRALAZINE 50 MG TABLET
ORAL_TABLET | Freq: Three times a day (TID) | ORAL | 11 refills | 30.00000 days | Status: CP
Start: 2019-04-29 — End: 2020-04-28

## 2019-04-29 MED ORDER — ASPIRIN 81 MG TABLET,DELAYED RELEASE
ORAL_TABLET | Freq: Every day | ORAL | 11 refills | 30.00000 days | Status: CP
Start: 2019-04-29 — End: 2020-04-28
  Filled 2019-05-01: qty 30, 30d supply, fill #0

## 2019-04-29 MED ORDER — OXYCODONE 5 MG TABLET
ORAL_TABLET | Freq: Four times a day (QID) | ORAL | 0 refills | 5 days | Status: CP | PRN
Start: 2019-04-29 — End: ?
  Filled 2019-05-01: qty 40, 5d supply, fill #0

## 2019-04-29 MED ORDER — DOCUSATE SODIUM 100 MG CAPSULE
ORAL_CAPSULE | Freq: Two times a day (BID) | ORAL | 0 refills | 30 days | Status: CP | PRN
Start: 2019-04-29 — End: ?
  Filled 2019-05-01: qty 60, 30d supply, fill #0

## 2019-05-01 DIAGNOSIS — Z940 Kidney transplant status: Principal | ICD-10-CM

## 2019-05-01 DIAGNOSIS — D899 Disorder involving the immune mechanism, unspecified: Principal | ICD-10-CM

## 2019-05-01 MED ORDER — HYDRALAZINE 100 MG TABLET
ORAL_TABLET | Freq: Three times a day (TID) | ORAL | 11 refills | 30 days | Status: CP
Start: 2019-05-01 — End: 2020-04-25
  Filled 2019-05-01: qty 90, 30d supply, fill #0

## 2019-05-01 MED ORDER — SULFAMETHOXAZOLE 400 MG-TRIMETHOPRIM 80 MG TABLET
ORAL_TABLET | ORAL | 5 refills | 28.00000 days | Status: CP
Start: 2019-05-01 — End: 2019-10-28
  Filled 2019-05-01: qty 12, 28d supply, fill #0

## 2019-05-01 MED ORDER — CLONIDINE HCL 0.2 MG TABLET
ORAL_TABLET | Freq: Three times a day (TID) | ORAL | 11 refills | 30.00000 days | Status: CP
Start: 2019-05-01 — End: 2020-04-30
  Filled 2019-05-01: qty 90, 30d supply, fill #0

## 2019-05-01 MED FILL — HYDRALAZINE 100 MG TABLET: 30 days supply | Qty: 90 | Fill #0 | Status: AC

## 2019-05-01 MED FILL — CLONIDINE HCL 0.2 MG TABLET: 30 days supply | Qty: 90 | Fill #0 | Status: AC

## 2019-05-01 MED FILL — VALGANCICLOVIR 450 MG TABLET: 30 days supply | Qty: 30 | Fill #0 | Status: AC

## 2019-05-01 MED FILL — MG-PLUS-PROTEIN 133 MG TABLET: 50 days supply | Qty: 100 | Fill #0 | Status: AC

## 2019-05-01 MED FILL — ENVARSUS XR 4 MG TABLET,EXTENDED RELEASE: ORAL | 30 days supply | Qty: 90 | Fill #0

## 2019-05-01 MED FILL — OXYCODONE 5 MG TABLET: 5 days supply | Qty: 40 | Fill #0 | Status: AC

## 2019-05-01 MED FILL — MYFORTIC 180 MG TABLET,DELAYED RELEASE: 30 days supply | Qty: 180 | Fill #0 | Status: AC

## 2019-05-01 MED FILL — ASPIRIN 81 MG TABLET,DELAYED RELEASE: 30 days supply | Qty: 30 | Fill #0 | Status: AC

## 2019-05-01 MED FILL — DOK 100 MG CAPSULE: 30 days supply | Qty: 60 | Fill #0 | Status: AC

## 2019-05-01 MED FILL — POLYETHYLENE GLYCOL 3350 17 GRAM/DOSE ORAL POWDER: 30 days supply | Qty: 510 | Fill #0 | Status: AC

## 2019-05-01 MED FILL — ENVARSUS XR 1 MG TABLET,EXTENDED RELEASE: 30 days supply | Qty: 90 | Fill #0 | Status: AC

## 2019-05-01 MED FILL — SULFAMETHOXAZOLE 400 MG-TRIMETHOPRIM 80 MG TABLET: 28 days supply | Qty: 12 | Fill #0 | Status: AC

## 2019-05-01 MED FILL — ENVARSUS XR 4 MG TABLET,EXTENDED RELEASE: 30 days supply | Qty: 90 | Fill #0 | Status: AC

## 2019-05-01 MED FILL — GABAPENTIN 100 MG CAPSULE: 12 days supply | Qty: 12 | Fill #0 | Status: AC

## 2019-05-02 NOTE — Unmapped (Signed)
Discharge Summary    Admit date: 04/28/2019    Discharge date and time: 05/02/2019     Discharge to:  Home    Discharge Service: Surg Transplant [facility])    Discharge Attending Physician: Loney Hering, MD    Discharge  Diagnoses: ESRD s/p renal transplantation    Secondary Diagnosis: Principal Problem:    NKR LUD Kidney transplant POA: Not Applicable  Active Problems:    Immunocompromised patient (CMS-HCC) POA: No  Resolved Problems:    * No resolved hospital problems. *      OR Procedures:    Right - RENAL ALLOTRANSPLANTATION, IMPLANTATION OF GRAFT; WITHOUT RECIPIENT NEPHRECTOMY  Right - BACKBNCH STD PREP CAD DONR RENAL ALLOGFT PRIOR TO TRNSPLNT, INCL DISSEC/REM PERINEPH FAT, DIAPH/RTPER ATTAC  Date  04/28/2019  -------------------     Ancillary Procedures: no procedures    Discharge Day Services:     Subjective   No acute events overnight. Pain Controlled. No fever or chills.    Objective   Patient Vitals for the past 8 hrs:   BP Temp Temp src Pulse Resp SpO2 Height Weight   05/02/19 0750 142/94 36.5 ??C Oral 72 18 98 % 177.8 cm (5' 10) 67 kg (147 lb 12.8 oz)   05/02/19 0428 159/94 36.1 ??C Oral 72 17 96 % ??? ???     I/O this shift:  In: 0   Out: 160 [Urine:150; Drains:10]    General Appearance:   No acute distress  Lungs:                Clear to auscultation bilaterally  Heart:                           Regular rate and rhythm  Abdomen:                Soft, non-tender, non-distended. Incision c/d/i.  Extremities:              Warm and well perfused      Hospital Course:  The patient was taken to the OR on 04/28/2019 for living donor kidney transplantation. He tolerated the procedure well, was extubated in the OR, and was taken to the PACU where He received routine postoperative care. In the PACU, the patient was found to have a peri-incisional hematoma. He underwent U/S which demonstrated minor increase in resistive indices in the kidney, and a perinephric hematoma. Repeat U/S in the morning demonstrated improved renal perfusion and decreased resistive indices, with persistent hematoma. The hematoma was managed conservatively with daily clinical improvement.      He was then transferred to the Surgical Stepdown unit for close observation and cardiorespiratory monitoring.    He was initially placed on 1:1 UOP/IVF replacement and then transitioned to 0.9% NS at 145mL/hr. He was clinically stable postoperatively, maintaining adequate urine output.  He experienced hypertension on POD1 and POD2, for which he was started on his home Norvasc 10mg  daily, home hydralazine was increased to 100mg  PO TID, and home clonidine was increased to 0.2mg  PO TID.  He was transferred to the floor on POD#3, with continued antihypertensive management, and his pressures were well controlled on the above described regimen.     Marland Kitchen He did well thereafter and  diet was slowly advanced and at the time of discharge He was tolerating a regular diet. The patient was able to void spontaneously following discontinuation of Foley catheter POD#3, have his pain controlled with PO pain  medication, and return to the preoperative ambulatory status. Anti-rejection medication levels were monitored and dosages adjusted to maintain a therapeutic regimen. The patient was seen and assessed by Physical Therapy and deemed suitable for discharge. He will be discharged home on POD#3 in stable condition without home healthcare.    His JP drain was removed on the day of discharge    Condition at Discharge: Improved  Discharge Medications:      Medication List      START taking these medications    ??? aspirin 81 MG tablet; Commonly known as: ECOTRIN; Take 1 tablet (81 mg   total) by mouth daily. ??? DOK 100 MG capsule; Generic drug: docusate sodium; Take 1 capsule (100   mg total) by mouth two (2) times a day as needed for constipation.  ??? * ENVARSUS XR 1 mg Tb24 extended release tablet; Generic drug:   tacrolimus; Take 3 mg (3 tablets) by mouth daily. Adjust dose per   medication card.  ??? * ENVARSUS XR 4 mg Tb24 extended release tablet; Generic drug:   tacrolimus; Take 12 mg (3 tablets) by mouth daily. Adjust dose per   medication card.  ??? gabapentin 100 MG capsule; Commonly known as: NEURONTIN; Take 1 capsule   (100 mg total) by mouth nightly for 12 days.  ??? magnesium (amino acid chelate) 133 mg Tab; Generic drug: magnesium   oxide-Mg AA chelate; Take 1 tablet by mouth Two (2) times a day.  ??? MYFORTIC 180 MG EC tablet; Generic drug: mycophenolate; Take 3 tablets   (540 mg total) by mouth Two (2) times a day. Adjust dose per medication   card.  ??? oxyCODONE 5 MG immediate release tablet; Commonly known as: ROXICODONE;   Take 1-2 tablets (5-10 mg total) by mouth every six (6) hours as needed.  ??? polyethylene glycol 17 gram/dose powder; Commonly known as: GLYCOLAX;   Dissolve 1 capful (17g) in 8 oz of juice or water daily as needed  ??? sulfamethoxazole-trimethoprim 400-80 mg per tablet; Commonly known as:   BACTRIM; Take 1 tablet (80 mg of trimethoprim total) by mouth 3 (three)   times a week.  ??? valGANciclovir 450 mg tablet; Commonly known as: VALCYTE; Take 1 tablet   (450 mg total) by mouth daily.  * This list has 2 medication(s) that are the same as other medications   prescribed for you. Read the directions carefully, and ask your doctor or   other care provider to review them with you.     CHANGE how you take these medications    ??? cloNIDine HCL 0.2 MG tablet; Commonly known as: CATAPRES; Take 1 tablet   (0.2 mg total) by mouth Three (3) times a day.; What changed: medication   strength, when to take this  ??? hydrALAZINE 100 MG tablet; Commonly known as: APRESOLINE; Take 1 tablet (100 mg total) by mouth Three (3) times a day.; What changed: medication   strength, how much to take, when to take this     CONTINUE taking these medications    ??? ALPRAZolam 0.25 MG tablet; Commonly known as: XANAX  ??? amLODIPine 10 MG tablet; Commonly known as: NORVASC  ??? escitalopram oxalate 10 MG tablet; Commonly known as: LEXAPRO  ??? omeprazole 20 MG capsule; Commonly known as: PriLOSEC  ??? predniSONE 10 MG tablet; Commonly known as: DELTASONE     STOP taking these medications    ??? atenoloL 50 MG tablet; Commonly known as: TENORMIN  ??? calcium acetate(phosphat bind) 667 mg  capsule; Commonly known as: PHOSLO  ??? losartan 100 MG tablet; Commonly known as: COZAAR  ??? sucralfate 1 gram tablet; Commonly known as: CARAFATE       Pending Test Results:     Discharge Instructions:  Activity:     Diet:    Other Instructions:  Other Instructions     Discharge instructions      Activity: Do not lift > 10-15 lbs for 1st 6 weeks, then gradually increase to 25 lbs over the following 6 weeks. Resume heavy lifting only after being cleared to do so at follow-up appointment in Transplant Surgery clinic.    Diet: Regular    Your Post-Transplant Coordinator is [facility]. Contact your transplant coordinator or the Transplant Surgery Office [phone]) during business hours or page the transplant coordinator on call [phone]) after business hours for:    - fever >100.5 degrees F by mouth, any fever with shaking chills, or other signs or symptoms of infection   - uncontrolled nausea, vomiting, or diarrhea; inability to have a bowel movement for > 3 days.   - any problem that prevents taking medications as scheduled.   - pain uncontrolled with prescribed medication or new pain or tenderness at the surgical site   - sudden weight gain or increase in blood pressure (greater than 140/85)   - shortness of breath, chest pain / discomfort   - new or increasing jaundice - urinary symptoms including pain / difficulty / burning or tea-colored urine   - any other new or concerning symptoms   - questions regarding your medications or continuing care      Patient may shower, but should not immerse wounds in bath or pool for 2-3 weeks. Wash the surgical site with mild soap and water, but do not scrub vigorously.    You may dress wounds with dry gauze and tape to avoid soilage.    Do not drive or operate heavy machinery prior to MD clearance, or at any time while taking narcotics.    Inspect surgical sites at least twice daily, contact Transplant Coordinator for spreading redness, purulent discharge, or increasing bleeding or drainage, or for separation of wounds.     Maintain a written record of daily vital signs, per Handbook instructions.     Maintain a written record of medications taken and review against the discharge medications sheet (orange paper). Periodically review your Transplant Handbook for important information regarding postoperative care and required precautions.      Labs and Other Follow-ups after Discharge:   Kidney  Labs 3x week: CBC, BMP, Mg, Phos and Tacrolimus trough  Labs every 3 months: Hepatic function panel    Transplant Coordinator:  Thayer Headings- phone: [phone] fax: [phone]             Labs and Other Follow-ups after Discharge:  Follow Up instructions and Outpatient Referrals     Discharge instructions            Future Appointments:  Appointments which have been scheduled for you    May 11, 2019  9:00 AM  (Arrive by 8:30 AM)  LAB ONLY with LAB PHLEB GRND UNCW  LAB PHLEB GRND FLR [employer] [facility] REGION) [address]  [place] [address]  [phone]      May 11, 2019  9:40 AM  (Arrive by 9:10 AM)  Nila Nephew W MD with Wallace Cullens Mincemoyer, CPP  [facility] TRANSPLANT SURGERY [place]) [address]  [place] [address]  [phone]      May 11, 2019 11:15 AM  (Arrive by 10:45 AM) RETURN 15 with Loney Hering, MD  [facility] TRANSPLANT SURGERY [place] [facility]) [address]  [place] [address]  [phone]      May 26, 2019  7:30 AM  (Arrive by 7:00 AM)  LAB ONLY with LAB PHLEB GRND UNCW  LAB PHLEB GRND FLR [employer] [facility] REGION) [address]  [place] [address]  [phone]      May 26, 2019  8:00 AM  (Arrive by 7:30 AM)  RETURN PHARMD with Wallace Cullens Mincemoyer, CPP  [facility] KIDNEY TRANSPLANT [facility] REGION) [address]  [place] [address]  [phone]      May 26, 2019  8:30 AM  (Arrive by 8:00 AM)  RETURN  GENERAL with Cordelia Poche, AGNP  [facility] KIDNEY TRANSPLANT [place] [facility] REGION) [address]  [place] [address]  [phone]

## 2019-05-03 ENCOUNTER — Encounter
Admit: 2019-05-03 | Discharge: 2019-05-04 | Disposition: A | Discharge status: Home / Self Care | Payer: PRIVATE HEALTH INSURANCE | Attending: Emergency Medicine

## 2019-05-03 DIAGNOSIS — R109 Unspecified abdominal pain: Principal | ICD-10-CM

## 2019-05-03 DIAGNOSIS — G8918 Other acute postprocedural pain: Principal | ICD-10-CM

## 2019-05-03 DIAGNOSIS — N9989 Other postprocedural complications and disorders of genitourinary system: Principal | ICD-10-CM

## 2019-05-03 DIAGNOSIS — R338 Other retention of urine: Principal | ICD-10-CM

## 2019-05-03 MED ORDER — TAMSULOSIN 0.4 MG CAPSULE
ORAL_CAPSULE | Freq: Every day | ORAL | 0 refills | 30 days | Status: CP
Start: 2019-05-03 — End: 2019-06-02

## 2019-05-04 ENCOUNTER — Other Ambulatory Visit
Admission: RE | Admit: 2019-05-04 | Discharge: 2019-05-04 | Disposition: A | Discharge status: Home / Self Care | Payer: BLUE CROSS/BLUE SHIELD | Attending: Nephrology | Admitting: Nephrology

## 2019-05-04 DIAGNOSIS — E1129 Type 2 diabetes mellitus with other diabetic kidney complication: Secondary | ICD-10-CM | POA: Insufficient documentation

## 2019-05-04 DIAGNOSIS — Z114 Encounter for screening for human immunodeficiency virus [HIV]: Secondary | ICD-10-CM | POA: Insufficient documentation

## 2019-05-04 DIAGNOSIS — T8610 Unspecified complication of kidney transplant: Secondary | ICD-10-CM | POA: Diagnosis not present

## 2019-05-04 DIAGNOSIS — E559 Vitamin D deficiency, unspecified: Secondary | ICD-10-CM | POA: Diagnosis not present

## 2019-05-04 DIAGNOSIS — Z09 Encounter for follow-up examination after completed treatment for conditions other than malignant neoplasm: Secondary | ICD-10-CM | POA: Diagnosis not present

## 2019-05-04 DIAGNOSIS — D631 Anemia in chronic kidney disease: Secondary | ICD-10-CM | POA: Insufficient documentation

## 2019-05-04 DIAGNOSIS — Z9483 Pancreas transplant status: Secondary | ICD-10-CM | POA: Insufficient documentation

## 2019-05-04 DIAGNOSIS — Z789 Other specified health status: Secondary | ICD-10-CM | POA: Diagnosis not present

## 2019-05-04 DIAGNOSIS — D899 Disorder involving the immune mechanism, unspecified: Secondary | ICD-10-CM | POA: Insufficient documentation

## 2019-05-04 DIAGNOSIS — Z79899 Other long term (current) drug therapy: Secondary | ICD-10-CM | POA: Diagnosis not present

## 2019-05-04 DIAGNOSIS — N390 Urinary tract infection, site not specified: Secondary | ICD-10-CM | POA: Insufficient documentation

## 2019-05-04 DIAGNOSIS — N189 Chronic kidney disease, unspecified: Secondary | ICD-10-CM | POA: Insufficient documentation

## 2019-05-04 DIAGNOSIS — B259 Cytomegaloviral disease, unspecified: Secondary | ICD-10-CM | POA: Diagnosis not present

## 2019-05-04 DIAGNOSIS — Z940 Kidney transplant status: Secondary | ICD-10-CM | POA: Insufficient documentation

## 2019-05-04 LAB — CBC WITH DIFFERENTIAL/PLATELET
Abs Immature Granulocytes: 0.36 10*3/uL — ABNORMAL HIGH (ref 0.00–0.07)
Basophils Absolute: 0 10*3/uL (ref 0.0–0.1)
Basophils Relative: 1 %
Eosinophils Absolute: 0 10*3/uL (ref 0.0–0.5)
Eosinophils Relative: 0 %
HCT: 34.8 % — ABNORMAL LOW (ref 39.0–52.0)
Hemoglobin: 11.3 g/dL — ABNORMAL LOW (ref 13.0–17.0)
Immature Granulocytes: 6 %
Lymphocytes Relative: 0 %
Lymphs Abs: 0 10*3/uL — ABNORMAL LOW (ref 0.7–4.0)
MCH: 30.5 pg (ref 26.0–34.0)
MCHC: 32.5 g/dL (ref 30.0–36.0)
MCV: 94.1 fL (ref 80.0–100.0)
Monocytes Absolute: 0.2 10*3/uL (ref 0.1–1.0)
Monocytes Relative: 3 %
Neutro Abs: 5.5 10*3/uL (ref 1.7–7.7)
Neutrophils Relative %: 90 %
Platelets: 206 10*3/uL (ref 150–400)
RBC: 3.7 MIL/uL — ABNORMAL LOW (ref 4.22–5.81)
RDW: 15.2 % (ref 11.5–15.5)
Smear Review: UNDETERMINED
WBC: 6.1 10*3/uL (ref 4.0–10.5)
nRBC: 0 % (ref 0.0–0.2)

## 2019-05-04 LAB — BASIC METABOLIC PANEL
Anion gap: 13 (ref 5–15)
BUN: 37 mg/dL — ABNORMAL HIGH (ref 6–20)
CO2: 21 mmol/L — ABNORMAL LOW (ref 22–32)
Calcium: 9 mg/dL (ref 8.9–10.3)
Chloride: 104 mmol/L (ref 98–111)
Creatinine, Ser: 2 mg/dL — ABNORMAL HIGH (ref 0.61–1.24)
GFR calc Af Amer: 49 mL/min — ABNORMAL LOW (ref >60)
GFR calc non Af Amer: 42 mL/min — ABNORMAL LOW (ref >60)
Glucose, Bld: 120 mg/dL — ABNORMAL HIGH (ref 70–99)
Potassium: 3.9 mmol/L (ref 3.5–5.1)
Sodium: 138 mmol/L (ref 135–145)

## 2019-05-04 LAB — PHOSPHORUS: Phosphorus: 2.4 mg/dL — ABNORMAL LOW (ref 2.5–4.6)

## 2019-05-04 LAB — MAGNESIUM: Magnesium: 2 mg/dL (ref 1.7–2.4)

## 2019-05-04 LAB — TACROLIMUS LEVEL, TROUGH: TACROLIMUS, TROUGH: 7.3 ng/mL (ref 5.0–15.0)

## 2019-05-04 LAB — HLA CL2 AB RESULT: Lab: NEGATIVE

## 2019-05-04 LAB — TACROLIMUS, TROUGH: Lab: 7.3

## 2019-05-04 LAB — FSAB CLASS 2 ANTIBODY SPECIFICITY

## 2019-05-04 NOTE — Unmapped (Signed)
After a thorough review of the chart this patient does not meet criteria for a Transitions Call at this time. Reason for Disqualification : Transition completed by PCP/Specialty provider Please contact [phone] with any further questions.

## 2019-05-04 NOTE — Unmapped (Signed)
Per inpatient team, patient will fill refills at [facility] post-discharge.  Pt enrolled in specialty calls with onboarding/welcome call set up for later this week.

## 2019-05-04 NOTE — Unmapped (Signed)
[facility]  Emergency Department Progress Note for Continuation of Care      May 03, 2019 9:54 PM    Ryan Moon is a 33 y.o. male who was signed out to me.  I have reviewed the available documentation and test results.    Brief History and Assessment:     I have assumed care of this patient.    Briefly, this is a 33 y.o. male with a history of hypothyroidism, hypertension, ESRD s/p right renal transplant 11/24 who presented to the ED with acute worsening right-sided abdominal pain and dysuria.  Transplant surgery evaluated the patient in the ED.      [facility] ED Course:     9:56 PM  Labs show no leukocytosis, increased hemoglobin from prior.  CMP shows stable creatinine 1.82 and minor elevation of ALT at 69 with remainder of LFTs within normal limits.  Urinalysis without evidence of infection.  Renal transplant with Doppler ultrasound showed right lower quadrant transplant kidney with good perfusion and interval decrease in resistive indices in the renal transplant arteries that are at or just below normal limits.  There is been interval decrease in complex perinephric fluid collection adjacent to the upper follow.  The ultrasound also showed a large post void residual.  Foley catheter placed for urinary tension per transplant surgery team.  Patient was discharged with urology referral for urinary retention and Flomax prescription.  Foley catheter will remain in place until urology follow-up.    ED Clinical Impression     Final diagnoses:   Postoperative abdominal pain (Primary)

## 2019-05-04 NOTE — Unmapped (Signed)
Transplant Surgery Consult Note    Requesting Attending Physician:  Marney Setting, MD  Service Requesting Consult:  Emergency Medicine   Consult Service: Transplant surgery   Consult Attending: Johna Sheriff, MD    Assessment:  Ryan Moon is a 33 y.o. male with h/o ESRD who is now POD#5 s/p living donor kidney transplant on 11/24. Postop course complicated by perinephric hematoma which was managed non-operatively, and was recently discharged yesterday. Now represents to the ED complaining of acute onset RLQ/suprapubic pain, dysuria and difficulty voiding secondary to pain. No systemic symptoms of infection. Labs unremarkable with no leukocytosis and Cr stable. UA negative. Renal u/s demonstrates improved resistive indices with decreased size in perinephric hematoma, but is notable for large post void residual (nearly ). Patient's symptoms are likely secondary to urinary retention. He reports marked improvement in abdominal pain s/p foley placement in ED.     Plan: Ok for dispo from ED with foley in place. Will have patient f/u in urology clinic in 5 days for foley removal and TOV (outpatient referral ordered). Patient prescribed flomax and should continue this at home. Patient instructed to keep his postop transplant surgery follow up clinic appointment on 12/7 as previously scheduled.     If you have any questions, concerns or changes in the patient's clinical status, please feel free to contact [facility] consult pager [phone]. Thank you for this interesting consult.    History of Present Illness:   Chief Complaint:  Right sided abdominal pain, dysuria/frequency    Ryan Moon is a 33 y.o. male who is seen in consultation for RLQ pain dysuria and frequency in setting of renal transplant 5 days ago at the request of Marney Setting, MD on the Emergency Medicine service. Ryan Moon is 34 y.o. 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 Immediate postoperative course was complicated by peri-incisional hematoma and perinephric hematoma, which was managed nonoperatively. Also had HTN postop, which has been well controlled with antihypertensive regimen. BP in ED today is 120/80s. He was discharged from the hospital yesterday. Patient was doing well initially and voiding without difficulty since foley was removed on POD#3 (11/27). However, he states that around 3 AM this morning the patient developed acute onset RLQ abdominal pain with associated dysuria and increased frequency. Urine is non bloody and non cloudy, but has had very small voids all day today secondary to pain. No fevers or chills. No n/v/d/constipation. No drainage from incisions, no drains in place. Oxycodone helps but does not completely relieve pain. He contacted the transplant nurse multiple times today and was instructed to come to the ED for further evaluation.     Past Medical History:   Past Medical History:   Diagnosis Date   ??? Anemia    ??? HTN (hypertension)    ??? Renal vasculitis (CMS-HCC)    ??? Secondary hyperparathyroidism (CMS-HCC)        Past Surgical History:  Past Surgical History:   Procedure Laterality Date   ??? APPENDECTOMY  2013   ??? PR TRANSPLANT,PREP CADAVER RENAL GRAFT Right 04/28/2019    Procedure: [facility] STD PREP CAD DONR RENAL ALLOGFT PRIOR TO TRNSPLNT, INCL DISSEC/REM PERINEPH FAT, DIAPH/RTPER ATTAC;  Surgeon: Doyce Loose, MD;  Location: MAIN OR [facility];  Service: Transplant   ??? PR TRANSPLANTATION OF KIDNEY Right 04/28/2019    Procedure: RENAL ALLOTRANSPLANTATION, IMPLANTATION OF GRAFT; WITHOUT RECIPIENT NEPHRECTOMY;  Surgeon:  Doyce Loose, MD;  Location: MAIN OR [facility];  Service: Transplant       Medications:  No current facility-administered medications on file prior to encounter. Current Outpatient Medications on File Prior to Encounter   Medication Sig Dispense Refill   ??? ALPRAZolam (XANAX) 0.25 MG tablet Take 0.5 mg by mouth every twelve (12) hours.      ??? amLODIPine (NORVASC) 10 MG tablet Take 10 mg by mouth daily.     ??? aspirin (ECOTRIN) 81 MG tablet Take 1 tablet (81 mg total) by mouth daily. 30 tablet 11   ??? cloNIDine HCL (CATAPRES) 0.2 MG tablet Take 1 tablet (0.2 mg total) by mouth Three (3) times a day. 90 tablet 11   ??? docusate sodium (COLACE) 100 MG capsule Take 1 capsule (100 mg total) by mouth two (2) times a day as needed for constipation. 60 capsule 0   ??? escitalopram oxalate (LEXAPRO) 10 MG tablet Take 15 mg by mouth.     ??? gabapentin (NEURONTIN) 100 MG capsule Take 1 capsule (100 mg total) by mouth nightly for 12 days. 12 capsule 0   ??? hydrALAZINE (APRESOLINE) 100 MG tablet Take 1 tablet (100 mg total) by mouth Three (3) times a day. 90 tablet 11   ??? magnesium oxide-Mg AA chelate (MAGNESIUM, AMINO ACID CHELATE,) 133 mg Tab Take 1 tablet by mouth Two (2) times a day. 60 tablet 11   ??? MYFORTIC 180 mg EC tablet Take 3 tablets (540 mg total) by mouth Two (2) times a day. Adjust dose per medication card. 180 tablet 11   ??? omeprazole (PRILOSEC) 20 MG capsule Take 20 mg by mouth two (2) times a day.      ??? oxyCODONE (ROXICODONE) 5 MG immediate release tablet Take 1-2 tablets (5-10 mg total) by mouth every six (6) hours as needed. 40 tablet 0   ??? polyethylene glycol (GLYCOLAX) 17 gram/dose powder Dissolve 1 capful (17g) in 8 oz of juice or water daily as needed 510 g 0   ??? predniSONE (DELTASONE) 10 MG tablet Take 10 mg by mouth daily.   3   ??? sulfamethoxazole-trimethoprim (BACTRIM) 400-80 mg per tablet Take 1 tablet (80 mg of trimethoprim total) by mouth 3 (three) times a week. 12 tablet 5   ??? tacrolimus (ENVARSUS XR) 1 mg Tb24 extended release tablet Take 3 mg (3 tablets) by mouth daily. Adjust dose per medication card. 90 tablet 11 ??? tacrolimus (ENVARSUS XR) 4 mg Tb24 extended release tablet Take 12 mg (3 tablets) by mouth daily. Adjust dose per medication card. 90 tablet 11   ??? valGANciclovir (VALCYTE) 450 mg tablet Take 1 tablet (450 mg total) by mouth daily. 30 tablet 2       Allergies:  Allergies   Allergen Reactions   ??? Acetaminophen Nausea And Vomiting       Family History:  History reviewed. No pertinent family history.    Social History:   Social History     Tobacco Use   ??? Smoking status: Current Every Day Smoker     Types: Cigarettes   ??? Smokeless tobacco: Current User     Types: Chew   ??? Tobacco comment: Patient noted that he has cut down to 1-2 cigarettes per day and chews tobacco.    Substance Use Topics   ??? Alcohol use: Not Currently     Frequency: Never     Comment: Patient had a DUI at age 30. Until 5/16 patient would have 1-2 beers  per day.    ??? 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 Patient also reports having smoked  marijuana daily until 01/2018       Review of Systems  10 systems were reviewed and are negative except as noted specifically in the HPI.    Objective  Vitals:   Temp:  [36.9 ??C] 36.9 ??C  Heart Rate:  [98] 98  Resp:  [18] 18  BP: (128)/(99) 128/99  SpO2:  [99 %] 99 %    Intake/Output last 3 shifts:  No intake/output data recorded.    Physical Exam:   Constitutional: NAD  Eyes: PERRL, EOM intact, no scleral icterus or conjunctival erythema  Ears, nose, mouth and throat: Moist mucus membranes, no discharge  Neck: Supple, trachea midline, no gross masses  Respiratory: No increased WOB, Symmetrical chest rise, no audible wheeze or stridor  Cardiovascular: Extremities are warm and well perfused, no active bleeding Gastrointestinal: Abdomen is non-distended, non-rigid, no rebound, incisions are stapled, clean dry and intact, no drainage or erythema to the sites, he has diffuse tenderness in the RU/RLQ and in the right inguinal ligament area, as well as suprapubic tenderness and fullness. There is bruising to the dorsal shaft of the penis as well as the ventral part of the scrotum that is non-tender to palpation  Musculoskeletal: No gross limitations in ROM  Skin: No rashes  Neurologic: Alert. No gross focal neurological deficits  Psychiatric: Appropriate mood and affect    Most Recent Labs:  Lab Results   Component Value Date    WBC 6.4 05/03/2019    HGB 11.6 (L) 05/03/2019    HCT 35.0 (L) 05/03/2019    PLT 206 05/03/2019       Lab Results   Component Value Date    NA 135 05/03/2019    K 4.7 05/03/2019    CL 99 05/03/2019    CO2 25.0 05/03/2019    BUN 31 (H) 05/03/2019    CREATININE 1.82 (H) 05/03/2019    CALCIUM 9.5 05/03/2019    MG 1.7 05/02/2019    PHOS 2.1 (L) 05/02/2019       Imaging:  US Renal Transplant W Doppler    Result Date: 05/03/2019 EXAM: US RENAL TRANSPLANT W DOPPLER DATE: 05/03/2019 7:31 PM ACCESSION: [id number] UN DICTATED: 05/03/2019 7:32 PM INTERPRETATION LOCATION: Main [facility] CLINICAL INDICATION: 33 years old Male with s/p renal transplant with increased pain, dysuria COMPARISON: Renal transplant ultrasound from 04/29/2019 TECHNIQUE:  Ultrasound views of the renal transplant were obtained using gray scale and color and spectral Doppler imaging. Views of the urinary bladder were obtained using gray scale imaging. FINDINGS: TRANSPLANTED KIDNEY: The renal transplant was located in the right lower quadrant. Normal size and echogenicity.  No solid masses or calculi.  No hydronephrosis. Double-J ureteral stent with proximal tip in the renal pelvis and distal tip within the bladder. Interval decrease in complex perinephric fluid collection anterior to the upper pole measuring 2.4 x 2.1 x 1.5 cm, previously 5.0 x 4.3 x 2.3 cm. VESSELS: - Perfusion: Using power Doppler, normal perfusion was seen throughout the renal parenchyma. - Resistive indices in the renal transplant are decreased compared with prior examination. - Main renal artery/iliac artery: Patent - Main renal vein/iliac vein: Patent BLADDER: Large post void residual with calculated bladder volume of 582 mL. There is echogenic debris within the bladder. -Right lower  quadrant transplant kidney demonstrates good perfusion with interval decrease in resistive indices in the renal transplant arteries at or just below normal limits. -Interval decrease in complex perinephric fluid collection adjacent to the upper pole. -Large post void residual (582 mL) with internal echogenic debris. Correlate with urinalysis. Please see below for data measurements: RLQ kidney: length 10.9 cm; width 4.8 cm; height 4.8 cm Arcuate artery superior resistive index: 0.52(previously 0.60) Arcuate artery mid resistive index: 0.51(0.52) Arcuate artery inferior resistive index: 0.54(0.57) Previous resistive indices range of arcuate arteries: 0.52-0.60 Segmental artery superior resistive index: 0.47(0.56) Segmental artery mid resistive index: 0.55(0.57) Segmental artery inferior resistive index: 0.51(0.67) Previous resistive indices range of segmental arteries: 0.56-0.67 Main renal artery hilum resistive index: 0.57 (0.62) Main renal artery mid resistive index: 0.56 (0.63) Main renal artery anastomosis resistive index: 0.62 (0.66) Previous resistive indices range of main renal artery: 0.62-0.66 Main renal vein: patent Iliac artery: Patent Iliac vein: Patent Bladder volume postvoid: 581.65  mL

## 2019-05-04 NOTE — Unmapped (Signed)
[facility]  Emergency Department Provider Note        ED Clinical Impression      Final diagnoses:   Postoperative abdominal pain (Primary)           Impression, ED Course, Assessment and Plan      Impression: Ryan Moon is a 33 y.o. male with past medical history of hypothyroidism, hypertension, ESRD s/p right renal transplant 11/24 who presents with acute worsening right-sided abdominal pain and dysuria.  Diffuse pain on the right side of his abdomen and right groin, radiates to his right flank and back. Significant burning with urination, increased frequency, urgency. Subjectively decreased UOP. Endorses chills, fatigue. No drainage from skin incisions. Denies N/V.     On initial evaluation, patient appears fatigued, though in NAD. Vital signs are stable, he is afebrile, not tachycardic at 98, normotensive at 128/99, satting 99% on RA. On exam, incisions appear to be healing appropriately without active drainage or surrounding erythema. He is significantly tender to palpation about the R inguinal incision. Significant tenderness diffusely about the R upper and lower quadrants, R flank, R CVA. Benign cardiovascular and pulmonary exams. No peripheral edema.     Differential dx includes surgical site infection, graft pyelonephritis, perinephric fluid collection/abscess, ATN, renal vein thrombosis, acute rejection, renal artery stenosis. Patient with tenderness about surgical incisions, primarily inguinal incision increases concern for surgical site infection, however no significant erythema or drainage. Given significant abdominal pain with dysuria and urinary symptoms, pyelonephritis on differential, however pt afebrile in the ED. ATN considered in the setting of transplant and reported decreased UOP. In the immediate post-op period, may also consider vascular etiology and graft rejection. General surgery/transplant aware of patient and plan to evaluate in ED. Will initiate workup with CBC, CMP, UA, renal US transplant protocol.      ED Course as of May 03 2047   Wynelle Link May 03, 2019   0272 Spoke with general surgery.  Plan to come evaluate patient.  Will add on blood culture collection.      1830 Tacrolimus level added per transplant surgery        1856 Patient transported ultrasound.          Patient signed out to oncoming provider. Renal US and labs pending at the time of handoff. Dispo pending, however anticipate admission to transplant surgery given degree of patient symptoms in the setting of immunocompromised state/recent surgery. Plan to discuss with transplant following [place] results. Patient clinically stable at the time of handoff.        Additional Medical Decision Making     I have reviewed the vital signs and the nursing notes. Labs and radiology results that were available during my care of the patient were independently reviewed by me and considered in my medical decision making.     I reviewed the patient's prior medical records.   I discussed the case with the general surgery consultant.   I discussed the case and plan for continuity of care with the admitting provider.     I discussed the case with Dr. [name], the ED attending physician.     Portions of this record have been created using [employer]. Dictation errors have been sought, but these may not have been identified and corrected.     ____________________________________________         History        Chief Complaint  Post-op Problem      HPI Ryan Moon  is a 33 y.o. male with past medical history of hypothyroidism, hypertension, ESRD s/p right renal transplant 11/24 who presents with acute worsening right-sided abdominal pain and dysuria.  Patient states that he was discharged yesterday at which point he was experiencing only mild right-sided groin pain.  This morning at approximately 3 AM, he awoke with significant right-sided abdominal pain as well as dysuria.  The abdominal pain is rated as 10/10, is present in his right groin as well as diffusely in the right side of his abdomen, radiating around his flank to his back.  He reports significant burning with urination.  He has also had increased urinary frequency and urgency, with decreased urine output.  He denies any drainage from his skin incisions.  He endorses chills however denies measured fevers. Endorses increased fatigue since this morning. Denies nausea or vomiting. Denies diarrhea, bloody bowel movements.  Denies any recent illness, cough, sore throat, rhinorrhea, chest pain, shortness of breath.        Past Medical History:   Diagnosis Date   ??? Anemia    ??? HTN (hypertension)    ??? Renal vasculitis (CMS-HCC)    ??? Secondary hyperparathyroidism (CMS-HCC)        Patient Active Problem List   Diagnosis   ??? Anemia in chronic kidney disease, on chronic dialysis (CMS-HCC)   ??? Cellulitis   ??? Crescentic glomerulonephritis   ??? ESRD (end stage renal disease) on dialysis (CMS-HCC)   ??? Essential hypertension   ??? Gastroesophageal reflux disease without esophagitis   ??? HCAP (healthcare-associated pneumonia)   ??? Hypertensive urgency, malignant   ??? Peritonitis (CMS-HCC)   ??? SBP (spontaneous bacterial peritonitis) (CMS-HCC)   ??? Rectal foreign body   ??? NKR LUD Kidney transplant   ??? Immunocompromised patient (CMS-HCC)       Past Surgical History:   Procedure Laterality Date   ??? APPENDECTOMY  2013   ??? PR TRANSPLANT,PREP CADAVER RENAL GRAFT Right 04/28/2019 Procedure: [facility] STD PREP CAD DONR RENAL ALLOGFT PRIOR TO TRNSPLNT, INCL DISSEC/REM PERINEPH FAT, DIAPH/RTPER ATTAC;  Surgeon: Doyce Loose, MD;  Location: MAIN OR [facility];  Service: Transplant   ??? PR TRANSPLANTATION OF KIDNEY Right 04/28/2019    Procedure: RENAL ALLOTRANSPLANTATION, IMPLANTATION OF GRAFT; WITHOUT RECIPIENT NEPHRECTOMY;  Surgeon: Doyce Loose, MD;  Location: MAIN OR [facility];  Service: Transplant       No current facility-administered medications for this encounter.     Current Outpatient Medications:   ???  ALPRAZolam (XANAX) 0.25 MG tablet, Take 0.5 mg by mouth every twelve (12) hours. , Disp: , Rfl:   ???  amLODIPine (NORVASC) 10 MG tablet, Take 10 mg by mouth daily., Disp: , Rfl:   ???  aspirin (ECOTRIN) 81 MG tablet, Take 1 tablet (81 mg total) by mouth daily., Disp: 30 tablet, Rfl: 11  ???  cloNIDine HCL (CATAPRES) 0.2 MG tablet, Take 1 tablet (0.2 mg total) by mouth Three (3) times a day., Disp: 90 tablet, Rfl: 11  ???  docusate sodium (COLACE) 100 MG capsule, Take 1 capsule (100 mg total) by mouth two (2) times a day as needed for constipation., Disp: 60 capsule, Rfl: 0  ???  escitalopram oxalate (LEXAPRO) 10 MG tablet, Take 15 mg by mouth., Disp: , Rfl:   ???  gabapentin (NEURONTIN) 100 MG capsule, Take 1 capsule (100 mg total) by mouth nightly for 12 days., Disp: 12 capsule, Rfl: 0  ???  hydrALAZINE (APRESOLINE) 100 MG tablet, Take 1 tablet (100 mg total) by  mouth Three (3) times a day., Disp: 90 tablet, Rfl: 11  ???  magnesium oxide-Mg AA chelate (MAGNESIUM, AMINO ACID CHELATE,) 133 mg Tab, Take 1 tablet by mouth Two (2) times a day., Disp: 60 tablet, Rfl: 11  ???  MYFORTIC 180 mg EC tablet, Take 3 tablets (540 mg total) by mouth Two (2) times a day. Adjust dose per medication card., Disp: 180 tablet, Rfl: 11  ???  omeprazole (PRILOSEC) 20 MG capsule, Take 20 mg by mouth two (2) times a day. , Disp: , Rfl: ???  oxyCODONE (ROXICODONE) 5 MG immediate release tablet, Take 1-2 tablets (5-10 mg total) by mouth every six (6) hours as needed., Disp: 40 tablet, Rfl: 0  ???  polyethylene glycol (GLYCOLAX) 17 gram/dose powder, Dissolve 1 capful (17g) in 8 oz of juice or water daily as needed, Disp: 510 g, Rfl: 0  ???  predniSONE (DELTASONE) 10 MG tablet, Take 10 mg by mouth daily. , Disp: , Rfl: 3  ???  sulfamethoxazole-trimethoprim (BACTRIM) 400-80 mg per tablet, Take 1 tablet (80 mg of trimethoprim total) by mouth 3 (three) times a week., Disp: 12 tablet, Rfl: 5  ???  tacrolimus (ENVARSUS XR) 1 mg Tb24 extended release tablet, Take 3 mg (3 tablets) by mouth daily. Adjust dose per medication card., Disp: 90 tablet, Rfl: 11  ???  tacrolimus (ENVARSUS XR) 4 mg Tb24 extended release tablet, Take 12 mg (3 tablets) by mouth daily. Adjust dose per medication card., Disp: 90 tablet, Rfl: 11  ???  valGANciclovir (VALCYTE) 450 mg tablet, Take 1 tablet (450 mg total) by mouth daily., Disp: 30 tablet, Rfl: 2    Allergies  Acetaminophen    History reviewed. No pertinent family history.    Social History  Social History     Tobacco Use   ??? Smoking status: Current Every Day Smoker     Types: Cigarettes   ??? Smokeless tobacco: Current User     Types: Chew   ??? Tobacco comment: Patient noted that he has cut down to 1-2 cigarettes per day and chews tobacco.    Substance Use Topics   ??? Alcohol use: Not Currently     Frequency: Never     Comment: Patient had a DUI at age 63. Until 5/16 patient would have 1-2 beers per day.    ??? 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 Patient also reports having smoked  marijuana daily until 01/2018       Review of Systems    Constitutional: Negative for fever.  Eyes: Negative for visual or hearing changes.  ENT: Negative for sore throat.  Cardiovascular: Negative for chest pain. Respiratory: Negative for cough, shortness of breath.  Gastrointestinal: See HPI  Genitourinary: See HPI  Musculoskeletal: Endorses right-sided back pain  Skin: Negative for rash.  Neurological: Negative for headaches, focal weakness or numbness.    All other systems reviewed and are negative except as noted in HPI.      Physical Exam     ED Triage Vitals [05/03/19 1700]   Enc Vitals Group      BP 128/99      Heart Rate 98      SpO2 Pulse       Resp 18      Temp 36.9 ??C (98.5 ??F)      Temp src  SpO2 99 %      Weight       Height       Head Circumference       Peak Flow       Pain Score       Pain Loc       Pain Edu?       Excl. in GC?        Constitutional: Alert and oriented. Well appearing and in no acute distress.  Eyes: Conjunctivae are normal. PERRL.   ENT       Head: Normocephalic and atraumatic.       Nose: No epistaxis.       Mouth/Throat: Mucous membranes are moist, oropharynx without visible erythema or exudate.        Neck: No stridor.  Hematological/Lymphatic/Immunilogical: No evidence of easy bruising.  Cardiovascular: Normal rate, regular rhythm. No murmurs, rubs, or gallops. Normal and symmetric distal pulses are present in all extremities.  Respiratory: Normal respiratory effort. Breath sounds are clear bilaterally.  No wheezes, rhonchi, crackles.  Gastrointestinal: Surgical incisions with staples in place, no wound dehiscence.  No surrounding erythema, drainage.  Tenderness to palpation diffusely throughout the right abdomen, right flank, right groin.  Maximal tenderness on the medial aspect of the right groin incision, mild edema, no significant induration/warmth.   Genitourinary: Deferred.   Musculoskeletal: Moves all extremities spontaneously.        Right lower leg: No tenderness or edema.       Left lower leg: No tenderness or edema.  Neurologic: Normal speech and language. No gross focal neurologic deficits are appreciated.  Skin: Skin is warm, dry and intact. No rash noted. Psychiatric: Mood and affect are normal. Speech and behavior are normal.     EKG     None     Radiology     Renal ultrasound pending.          Procedures       Procedure(s) performed: None.           Carron Curie, MD  Resident  05/03/19 [phone]

## 2019-05-05 DIAGNOSIS — Z940 Kidney transplant status: Principal | ICD-10-CM

## 2019-05-05 MED ORDER — MYFORTIC 180 MG TABLET,DELAYED RELEASE
ORAL_TABLET | Freq: Two times a day (BID) | ORAL | 11 refills | 30 days | Status: CP
Start: 2019-05-05 — End: 2020-05-04

## 2019-05-05 MED ORDER — TACROLIMUS ER 1 MG TABLET,EXTENDED RELEASE 24 HR
ORAL_TABLET | Freq: Every day | ORAL | 11 refills | 30 days | Status: CP
Start: 2019-05-05 — End: 2020-05-04

## 2019-05-05 MED ORDER — TACROLIMUS ER 4 MG TABLET,EXTENDED RELEASE 24 HR
ORAL_TABLET | Freq: Every day | ORAL | 11 refills | 30.00000 days | Status: CP
Start: 2019-05-05 — End: 2020-05-04

## 2019-05-05 NOTE — Unmapped (Signed)
Received a message from pharmacy stating pt was having significant pain. Called patient, spoke with him and his wife, he states he is having 7/10 pain from his groin through his incision, up to his chest, pt states he has staples in his chest from possible hematoma. Reviewed notes, there was concern for hematoma around his kidney, was monitored. Pt states pain is numb/sharp around groin, burning/sore around his incision. Pt states after taking pain medications he has relief for around 30 minutes and then his pain increases. Pt wife states he has been sleeping a lot, when asking patient if he is sleeping, he states he is resting, but not sleeping and still in lots of pain.  Incision is C/D/I. Pt was seen at [facility] ED on Sunday for urinary retention, foley catheter was placed. Currently patient voiding 1600ml today, drining 4-5 16oz bottles per day. Pt currently taking Oxycodone 10mg Q6hrs and Tylenol 1000mg Q6hrs. Reviewed with Dr. Serrano, no interventions at this time. Called Urology to schedule appointment for TOV and foley removal, they will call patient to schedule, they state patient may have foley in for up to 30 days, but will get patient scheduled at their earliest appointment.     14 00: Called patient, left VM requesting call back to review plan.

## 2019-05-05 NOTE — Unmapped (Addendum)
12/3 update: new rxs for envarsus and myfortic received at [facility]. Confirmed that envarsus and mycophenolate generic are both $0 copays -ef    The following medications were onboarded today:  1. envarsus 1mg  and 4mg   2. Myfortic 180mg   3. Valganciclovir 450mg       [facility]   Patient Onboarding/Medication Counseling    [name] is a 33 y.o. male with kidney transplant who I am counseling today on continuation of therapy.  I am speaking to the patient.    Verified patient's date of birth / HIPAA.    Specialty medication(s) to be sent: na      Non-specialty medications/supplies to be sent: na      Medications not needed at this time: na         Valcyte (valganciclovir)    Medication & Administration     Dosage:   ? Take 1 tablet (450mg  total) by mouth daily    Administration:   ? Take with food  ? Swallow the pills whole, do not break, crush, or chew    Adherence/Missed dose instructions:  ? Take a missed dose as soon as you think about it with food  ? If it is close to your next dose, skip the missed dose and go back to your normal time.  ? Do not take 2 doses at the same time or extra doses.  ? Report any missed doses to coordinator    Goals of Therapy     ? To prevent or treat CMV infection in setting of solid organ transplant    Side Effects & Monitoring Parameters     ? Common side effects  ? Headache  ? Diarrhea or constipation  ? Appetite or sleep disturbances  ? Back, muscle, joint, or belly pain  ? Weight loss  ? Dizziness  ? Muscle spasm  ? Upset stomach or vomiting    ? The following side effects should be reported to the provider:  ? Allergic reaction  (rash, hives, swelling, blistered or peeling skin, shortness of breath)  ? Infection (fever, chills, sore throat, ear/sinus pain, cough, sputum change, urinary pain, mouth sores, non-healing wounds)  ? Bleeding (cough ground vomit, blood in urine, black/red/tarry stools, unexplained bruising or bleeding) ? Electrolyte problems (mood changes, confusion, weakness, abnormal heartbeat, seizures)  ? Kidney problems (urine changes, weight gain)  ? Yellowing skin or eyes  ? Swelling in arms, legs, stomach  ? Severe dizziness or passing out  ? Eye issues (eyesight changes, pain, or irritation)  ? Night sweats    ? Monitoring parameters  ? Have eye exam as directed by doctor  ? CMV counts  ? CBC  ? Renal function  ? Pregnancy test prior to initiation    Contraindications, Warnings, & Precautions     ? BBW: severe leukopenia, neutropenia, anemia, thrombocytopenia, pancytopenia, and bone marrow failure, including aplastic anemia have been reported  ? BBW: may cause temporary or permanent inhibition of spermatogenesis and suppression of fertilty; has the potential to cause birth defects and cancers in humans  ? Male patients should have pregnancy test prior to initiation and use birth control for at least 30 days after discontinuation  ? Male patients should use a barrier contraceptive while on therapy and for 90 days after discontinuation  ? Acute renal failure  ? Not indicated for use in liver transplant recipients  ? Breastfeeding is not recommended    Drug/Food Interactions     ? Medication list  reviewed in Epic. The patient was instructed to inform the care team before taking any new medications or supplements. no interactions noted that clinic is not already monitoring.   ? Check with your doctor before getting any vaccinations (live or inactivated)    Storage, Handling Precautions, & Disposal     ? Store at room temperature  ? Keep away from children and pets    Myfortic (mycophenolic acid)    Medication & Administration     Dosage:   ? Take 3 tablets (540mg  total) by mouth twice daily    Administration:   ? Take with or without food, although taking with food helps minimize GI side effects.  ? Swallow the pills whole, do not chew or crush    Adherence/Missed dose instructions: ? Take a missed dose as soon as you think about it.  ? If it is less than 2 hours until your next dose, skip the missed dose and go back to your normal time.  ? Do not take 2 doses at the same time or extra doses.    Goals of Therapy     ? To prevent organ rejection    Side Effects & Monitoring Parameters     ? Common side effects  ? Back or joint pain  ? Constipation  ? Headache/dizziness  ? Not hungry  ? Stomach pain, diarrhea, constipation, gas, upset stomach, vomiting, nausea  ? Feeling tired or weak  ? Shakiness  ? Trouble sleeping  ? Increased risk of infection    ? The following side effects should be reported to the provider:  ? Allergic reaction  ? High blood sugar (confusion, feeling sleepy, more thirst, more hungry, passing urine more often, flushing, fast breathing, or breath that smells like fruit)  ? Electrolyte issues (mood changes, confusion, muscle pain or weakness, a heartbeat that does not feel normal, seizures, not hungry, or very bad upset stomach or throwing up)  ? High or low blood pressure (bad headache or dizziness, passing out, or change in eyesight)  ? Kidney issues (unable to pass urine, change in how much urine is passed, blood in the urine, or a big weight gain)  ? Skin (oozing, heat, swelling, redness, or pain), UTI and other infections   ? Chest pain or pressure  ? Abnormal heartbeat  ? Unexplained bleeding or bruising  ? Abnormal burning, numbness, or tingling  ? Muscle cramps,  ? Yellowing of skin or eyes    ? Monitoring parameters  ? Pregnancy test initially prior to treatment and 8-10 days later then as needed)  ? CBC weekly for first month then twice monthly for next 2 months, then monthly)  ? Monitor Renal and liver functions  ? Signs of organ rejection    Contraindications, Warnings, & Precautions     ? *This is a REMS drug and an FDA-approved patient medication guide will be printed with each dispensation  ? Black Box Warning: Infections ? Black Box Warning: Lymphoproliferative disorders - risk of development of lymphoma and skin malignancy is increased  ? Black Box Warning: Use during pregnancy is associated with increased risks of first trimester pregnancy loss and congenital malformations.   ? Black Box Warning: Females of reproductive potential should use contraception during treatment and for 6 weeks after therapy is discontinued  ? CNS depression  ? New or reactivated viral infections  ? Neutropenia  ? Male patients and/or their male partners should use effective contraception during treatment of the male patient  and for at least 3 months after last dose.  ? Breastfeeding is not recommended during therapy and for 6 weeks after last dose    Drug/Food Interactions     ? Medication list reviewed in Epic. The patient was instructed to inform the care team before taking any new medications or supplements. no interactions noted that clinic is not already monioring.   ? Do not take Echinacea while on this medication  ? Check with your doctor before getting any vaccinations (live or inactivated)    Storage, Handling Precautions, & Disposal     ? Store at room temperature  ? Keep away from children and pets  ? This drug is considered hazardous and should be handled as little as possible.  Wash hands before and after touching pills. If someone else helps with medication administration, they should wear gloves.    Envarsus (tacrolimus XR)    Medication & Administration     Dosage: pt states he is on 6mg  daily - will verify and request new rx from clinic     Administration:   ? Take in the morning on empty stomach ??? 1 hour before or 2 hours after breakfast  ? Take with drink of water  ? Do not crush, break, or chew tablets    Adherence/Missed dose instructions:  ? Take a missed dose as soon as you think about it.  ? If it has been more than 15 hours since missed dose, skip the missed dose and go back to your regular time ? Report all missed doses to transplant coordinator    Goals of Therapy     ? To prevent organ rejection    Side Effects & Monitoring Parameters     ? Common side effects  ? Fatigue  ? Headache  ? Stuffy nose or sore throat  ? Nausea, vomiting, stomach pain, diarrhea, constipation  ? Heartburn  ? Back or joint pain  ? Increased risk of infection    ? The following side effects should be reported to the provider:  ? Allergic reaction  ? Kidney issues (change in quantity or urine passed, blood in urine, or weight gain)  ? High blood pressure (dizziness, change in eyesight, headache)  ? Electrolyte issues (change in mood, confusion, muscle pain, or weakness)  ? Abnormal breathing  ? Shakiness  ? Unexplained bleeding or bruising (gums bleeding, blood in urine, nosebleeds, any abnormal bleeding)  ? Signs of infection    ? Monitoring Parameters  ? Renal function  ? Liver function  ? Glucose levels  ? Blood pressure  ? Tacrolimus trough levels      Contraindications, Warnings, & Precautions     ? Black Box Warning: Infections - immunosuppressant agents increase the risk of infection that may lead to hospitalization or death  ? Black Box Warning: Malignancy - immunosuppressant agents may be associated with the development of malignancies that may lead to hospitalization or death.  Limit or avoid sun and ultraviolet light exposure, use appropriate sun protection  ? Myocardial hypertrophy -avoid use in patients with congenital long QT syndrome  ? Diabetes mellitus - the risk for new-onset diabetes and insulin-dependent post-transplant diabetes mellitus is increased with tacrolimus use after transplantation  ? GI perforation  ? Hyperkalemia  ? Hypertension  ? Nephrotoxicity  ? Neurotoxicity    Drug/Food Interactions     ? Medication list reviewed in Epic. no interactions noted that clinic is not already monitoring. ? Due to amount and severity of  drug interactions, report ALL medications starts, discontinuations, and changes to transplant coordinator prior to making the change  ? Avoid alcohol  ? Avoid grapefruit or grapefruit juice  ? Avoid live vaccines    Storage, Handling Precautions, & Disposal     ? Store at room temperature  ? Keep away from children and pets      Current Medications (including OTC/herbals), Comorbidities and Allergies     Current Outpatient Medications   Medication Sig Dispense Refill   ??? ALPRAZolam (XANAX) 0.25 MG tablet Take 0.5 mg by mouth every twelve (12) hours.      ??? amLODIPine (NORVASC) 10 MG tablet Take 10 mg by mouth daily.     ??? aspirin (ECOTRIN) 81 MG tablet Take 1 tablet (81 mg total) by mouth daily. 30 tablet 11   ??? cloNIDine HCL (CATAPRES) 0.2 MG tablet Take 1 tablet (0.2 mg total) by mouth Three (3) times a day. 90 tablet 11   ??? docusate sodium (COLACE) 100 MG capsule Take 1 capsule (100 mg total) by mouth two (2) times a day as needed for constipation. 60 capsule 0   ??? escitalopram oxalate (LEXAPRO) 10 MG tablet Take 15 mg by mouth.     ??? gabapentin (NEURONTIN) 100 MG capsule Take 1 capsule (100 mg total) by mouth nightly for 12 days. 12 capsule 0   ??? hydrALAZINE (APRESOLINE) 100 MG tablet Take 1 tablet (100 mg total) by mouth Three (3) times a day. 90 tablet 11   ??? magnesium oxide-Mg AA chelate (MAGNESIUM, AMINO ACID CHELATE,) 133 mg Tab Take 1 tablet by mouth Two (2) times a day. 60 tablet 11   ??? MYFORTIC 180 mg EC tablet Take 3 tablets (540 mg total) by mouth Two (2) times a day. Adjust dose per medication card. 180 tablet 11   ??? omeprazole (PRILOSEC) 20 MG capsule Take 20 mg by mouth two (2) times a day.      ??? oxyCODONE (ROXICODONE) 5 MG immediate release tablet Take 1-2 tablets (5-10 mg total) by mouth every six (6) hours as needed. 40 tablet 0   ??? polyethylene glycol (GLYCOLAX) 17 gram/dose powder Dissolve 1 capful (17g) in 8 oz of juice or water daily as needed 510 g 0 ??? predniSONE (DELTASONE) 10 MG tablet Take 10 mg by mouth daily.   3   ??? sulfamethoxazole-trimethoprim (BACTRIM) 400-80 mg per tablet Take 1 tablet (80 mg of trimethoprim total) by mouth 3 (three) times a week. 12 tablet 5   ??? tacrolimus (ENVARSUS XR) 1 mg Tb24 extended release tablet Take 3 mg (3 tablets) by mouth daily. Adjust dose per medication card. 90 tablet 11   ??? tacrolimus (ENVARSUS XR) 4 mg Tb24 extended release tablet Take 12 mg (3 tablets) by mouth daily. Adjust dose per medication card. 90 tablet 11   ??? tamsulosin (FLOMAX) 0.4 mg capsule Take 1 capsule (0.4 mg total) by mouth daily. 30 capsule 0   ??? valGANciclovir (VALCYTE) 450 mg tablet Take 1 tablet (450 mg total) by mouth daily. 30 tablet 2     No current facility-administered medications for this visit.        Allergies   Allergen Reactions   ??? Acetaminophen Nausea And Vomiting       Patient Active Problem List   Diagnosis   ??? Anemia in chronic kidney disease, on chronic dialysis (CMS-HCC)   ??? Cellulitis   ??? Crescentic glomerulonephritis   ??? ESRD (end stage renal disease) on dialysis (CMS-HCC)   ???  Essential hypertension   ??? Gastroesophageal reflux disease without esophagitis   ??? HCAP (healthcare-associated pneumonia)   ??? Hypertensive urgency, malignant   ??? Peritonitis (CMS-HCC)   ??? SBP (spontaneous bacterial peritonitis) (CMS-HCC)   ??? Rectal foreign body   ??? NKR LUD Kidney transplant   ??? Immunocompromised patient (CMS-HCC)       Reviewed and up to date in Epic.    Appropriateness of Therapy     Is medication and dose appropriate based on diagnosis? Yes    Baseline Quality of Life Assessment      How many days over the past month did your transplant keep you from your normal activities? 0    Financial Information     Medication Assistance provided: Copay Assistance    Anticipated copay of $0 on each med per last fills at [facility] reviewed with patient. Verified delivery address.    Delivery Information Scheduled delivery date: na/ - pt wants call back in 2 weeks    Expected start date: pt is currently already taking    Medication will be delivered via na to the na address in [place].  This shipment will not require a signature.      Explained the services we provide at [facility] and that each month we would call to set up refills.  Stressed importance of returning phone calls so that we could ensure they receive their medications in time each month.  Informed patient that we should be setting up refills 7-10 days prior to when they will run out of medication.  A pharmacist will reach out to perform a clinical assessment periodically.  Informed patient that a welcome packet and a drug information handout will be sent.      Patient verbalized understanding of the above information as well as how to contact the pharmacy at [phone] option 4 with any questions/concerns.  The pharmacy is open Monday through Friday 8:30am-4:30pm.  A pharmacist is available 24/7 via pager to answer any clinical questions they may have.    Patient Specific Needs     ? Does the patient have any physical, cognitive, or cultural barriers? No    ? Patient prefers to have medications discussed with  Patient     ? Is the patient able to read and understand education materials at a high school level or above? Yes    ? Patient's primary language is  English     ? Is the patient high risk? Yes, patient taking a REMS drug     ? Does the patient require a Care Management Plan? No     ? Does the patient require physician intervention or other additional services (i.e. nutrition, smoking cessation, social work)? No      [name]  [facility] Specialty Pharmacist

## 2019-05-05 NOTE — Unmapped (Signed)
Blood Culture #2  Order: [id number]  Status:  Preliminary result ????    No Growth at 24 hours

## 2019-05-06 ENCOUNTER — Other Ambulatory Visit
Admission: RE | Admit: 2019-05-06 | Discharge: 2019-05-06 | Disposition: A | Discharge status: Home / Self Care | Payer: BLUE CROSS/BLUE SHIELD | Source: Ambulatory Visit | Attending: Nephrology | Admitting: Nephrology

## 2019-05-06 DIAGNOSIS — Z940 Kidney transplant status: Principal | ICD-10-CM

## 2019-05-06 DIAGNOSIS — E559 Vitamin D deficiency, unspecified: Secondary | ICD-10-CM | POA: Insufficient documentation

## 2019-05-06 DIAGNOSIS — N189 Chronic kidney disease, unspecified: Secondary | ICD-10-CM | POA: Insufficient documentation

## 2019-05-06 DIAGNOSIS — B259 Cytomegaloviral disease, unspecified: Secondary | ICD-10-CM | POA: Diagnosis not present

## 2019-05-06 DIAGNOSIS — Z789 Other specified health status: Secondary | ICD-10-CM | POA: Diagnosis not present

## 2019-05-06 DIAGNOSIS — Z9483 Pancreas transplant status: Secondary | ICD-10-CM | POA: Insufficient documentation

## 2019-05-06 DIAGNOSIS — D631 Anemia in chronic kidney disease: Secondary | ICD-10-CM | POA: Diagnosis not present

## 2019-05-06 DIAGNOSIS — D899 Disorder involving the immune mechanism, unspecified: Secondary | ICD-10-CM | POA: Insufficient documentation

## 2019-05-06 DIAGNOSIS — Z79899 Other long term (current) drug therapy: Secondary | ICD-10-CM | POA: Diagnosis not present

## 2019-05-06 DIAGNOSIS — E1129 Type 2 diabetes mellitus with other diabetic kidney complication: Secondary | ICD-10-CM | POA: Diagnosis not present

## 2019-05-06 DIAGNOSIS — Z114 Encounter for screening for human immunodeficiency virus [HIV]: Secondary | ICD-10-CM | POA: Diagnosis not present

## 2019-05-06 DIAGNOSIS — N390 Urinary tract infection, site not specified: Secondary | ICD-10-CM | POA: Insufficient documentation

## 2019-05-06 DIAGNOSIS — Z09 Encounter for follow-up examination after completed treatment for conditions other than malignant neoplasm: Secondary | ICD-10-CM | POA: Diagnosis not present

## 2019-05-06 DIAGNOSIS — T8610 Unspecified complication of kidney transplant: Secondary | ICD-10-CM | POA: Insufficient documentation

## 2019-05-06 LAB — CBC WITH DIFFERENTIAL/PLATELET
Abs Immature Granulocytes: 0.5 10*3/uL — ABNORMAL HIGH (ref 0.00–0.07)
Basophils Absolute: 0 10*3/uL (ref 0.0–0.1)
Basophils Relative: 0 %
Eosinophils Absolute: 0 10*3/uL (ref 0.0–0.5)
Eosinophils Relative: 0 %
HCT: 31.5 % — ABNORMAL LOW (ref 39.0–52.0)
Hemoglobin: 10.2 g/dL — ABNORMAL LOW (ref 13.0–17.0)
Immature Granulocytes: 6 %
Lymphocytes Relative: 1 %
Lymphs Abs: 0.1 10*3/uL — ABNORMAL LOW (ref 0.7–4.0)
MCH: 30.9 pg (ref 26.0–34.0)
MCHC: 32.4 g/dL (ref 30.0–36.0)
MCV: 95.5 fL (ref 80.0–100.0)
Monocytes Absolute: 0.3 10*3/uL (ref 0.1–1.0)
Monocytes Relative: 3 %
Neutro Abs: 8.2 10*3/uL — ABNORMAL HIGH (ref 1.7–7.7)
Neutrophils Relative %: 90 %
Platelets: 202 10*3/uL (ref 150–400)
RBC: 3.3 MIL/uL — ABNORMAL LOW (ref 4.22–5.81)
RDW: 15.5 % (ref 11.5–15.5)
Smear Review: NORMAL
WBC: 9 10*3/uL (ref 4.0–10.5)
nRBC: 0 % (ref 0.0–0.2)

## 2019-05-06 LAB — PHOSPHORUS
Lab: 2.4 — ABNORMAL LOW
Phosphorus: 2.1 mg/dL — ABNORMAL LOW (ref 2.5–4.6)

## 2019-05-06 LAB — BASIC METABOLIC PANEL
Anion gap: 7 (ref 5–15)
BLOOD UREA NITROGEN: 37 mg/dL — ABNORMAL HIGH
BUN: 38 mg/dL — ABNORMAL HIGH (ref 6–20)
CALCIUM: 9 mg/dL
CHLORIDE: 104 mmol/L
CO2: 21 mmol/L — ABNORMAL LOW
CO2: 25 mmol/L (ref 22–32)
CREATININE: 2 mg/dL — ABNORMAL HIGH
Calcium: 8.7 mg/dL — ABNORMAL LOW (ref 8.9–10.3)
Chloride: 104 mmol/L (ref 98–111)
Creatinine, Ser: 1.91 mg/dL — ABNORMAL HIGH (ref 0.61–1.24)
EGFR CKD-EPI AA MALE: 49 mL/min/{1.73_m2} — ABNORMAL LOW
GFR calc Af Amer: 51 mL/min — ABNORMAL LOW (ref >60)
GFR calc non Af Amer: 44 mL/min — ABNORMAL LOW (ref >60)
Glucose, Bld: 109 mg/dL — ABNORMAL HIGH (ref 70–99)
Potassium: 4.2 mmol/L (ref 3.5–5.1)
SODIUM: 138 mmol/L
Sodium: 136 mmol/L (ref 135–145)

## 2019-05-06 LAB — MAGNESIUM
Lab: 2
Magnesium: 2.1 mg/dL (ref 1.7–2.4)

## 2019-05-06 LAB — TACROLIMUS LEVEL: Tacrolimus (FK506) - LabCorp: 4.7 ng/mL (ref 2.0–20.0)

## 2019-05-06 LAB — CBC W/ DIFFERENTIAL
BASOPHILS ABSOLUTE COUNT: 0 10*9/L
BASOPHILS RELATIVE PERCENT: 1 %
EOSINOPHILS ABSOLUTE COUNT: 0 10*9/L
EOSINOPHILS RELATIVE PERCENT: 0 %
HEMATOCRIT: 34.8 % — ABNORMAL LOW
LYMPHOCYTES ABSOLUTE COUNT: 0 10*9/L — ABNORMAL LOW
LYMPHOCYTES RELATIVE PERCENT: 0 %
MEAN CORPUSCULAR HEMOGLOBIN CONC: 32.5 g/dL
MEAN CORPUSCULAR HEMOGLOBIN: 30.5 pg
MEAN CORPUSCULAR VOLUME: 94.1 fL
MONOCYTES RELATIVE PERCENT: 3 %
NEUTROPHILS ABSOLUTE COUNT: 5.5 10*9/L
NEUTROPHILS RELATIVE PERCENT: 90 %
PLATELET COUNT: 206 10*9/L
RED BLOOD CELL COUNT: 3.7 10*12/L — ABNORMAL LOW
RED CELL DISTRIBUTION WIDTH: 15.2 %
WBC ADJUSTED: 6.1 10*9/L

## 2019-05-06 LAB — EGFR CKD-EPI AA FEMALE: Lab: 0

## 2019-05-06 LAB — BASOPHILS ABSOLUTE COUNT: Lab: 0

## 2019-05-06 MED ORDER — MYCOPHENOLATE SODIUM 180 MG TABLET,DELAYED RELEASE
ORAL_TABLET | Freq: Two times a day (BID) | ORAL | 11 refills | 0 days | Status: CP
Start: 2019-05-06 — End: 2020-05-05
  Filled 2019-05-27: qty 180, 30d supply, fill #0

## 2019-05-06 MED ORDER — OXYCODONE 5 MG TABLET
ORAL_TABLET | Freq: Four times a day (QID) | ORAL | 0 refills | 0 days | Status: CP | PRN
Start: 2019-05-06 — End: ?

## 2019-05-06 NOTE — Unmapped (Signed)
Blood Culture #2  Order: [id number]  Status: ??Preliminary result ????  ??  No Growth at 48 hours

## 2019-05-06 NOTE — Unmapped (Signed)
Received call from patient, he will be out of pain medication after today. He states he is in severe pain. Reviewed with Elson Clan, PA, prescription for Oxycodone 5-10mg  x 7 days sent to local [facility]. Pt made aware. Also reviewed pt will need to call [facility] for medication refills and provided number. Pt verbalized understanding.

## 2019-05-06 NOTE — Unmapped (Signed)
Envarsus = $0 copay through [employer] (both strengths)  Switching to generic mycophenolate due to insurance requirements -- $0 copay    Both scripts have been profiled. Per documentation, onboarding completed yesterday.

## 2019-05-06 NOTE — Unmapped (Signed)
CONFIDENTIAL PSYCHOLOGY TELEPHONE CONTACT ATTEMPT:    Writer attempted to call Mr. Spinella to assess current psychological functioning after kidney transplant on 04/28/19. Attempted to call x2, left VM requesting return call.     Theda Sers, Ph.D.,  Clinical Psychologist, [facility]  Abdominal Transplant

## 2019-05-06 NOTE — Unmapped (Signed)
Per [facility], pt insurance only covers generic Myfortic, new prescription sent.

## 2019-05-07 NOTE — Unmapped (Signed)
Blood Culture #2  Order: [id number]  Status: ??Preliminary result ????  ??  No Growth at 72 hours

## 2019-05-08 ENCOUNTER — Other Ambulatory Visit
Admission: RE | Admit: 2019-05-08 | Discharge: 2019-05-08 | Disposition: A | Discharge status: Home / Self Care | Payer: BLUE CROSS/BLUE SHIELD | Source: Ambulatory Visit | Attending: Nephrology | Admitting: Nephrology

## 2019-05-08 DIAGNOSIS — Z940 Kidney transplant status: Principal | ICD-10-CM

## 2019-05-08 DIAGNOSIS — B259 Cytomegaloviral disease, unspecified: Secondary | ICD-10-CM | POA: Diagnosis not present

## 2019-05-08 DIAGNOSIS — X58XXXA Exposure to other specified factors, initial encounter: Secondary | ICD-10-CM | POA: Diagnosis not present

## 2019-05-08 DIAGNOSIS — N390 Urinary tract infection, site not specified: Secondary | ICD-10-CM | POA: Insufficient documentation

## 2019-05-08 DIAGNOSIS — D899 Disorder involving the immune mechanism, unspecified: Secondary | ICD-10-CM | POA: Insufficient documentation

## 2019-05-08 DIAGNOSIS — Z79899 Other long term (current) drug therapy: Secondary | ICD-10-CM | POA: Insufficient documentation

## 2019-05-08 DIAGNOSIS — D631 Anemia in chronic kidney disease: Secondary | ICD-10-CM | POA: Diagnosis not present

## 2019-05-08 DIAGNOSIS — T8610 Unspecified complication of kidney transplant: Secondary | ICD-10-CM | POA: Diagnosis not present

## 2019-05-08 DIAGNOSIS — Z114 Encounter for screening for human immunodeficiency virus [HIV]: Secondary | ICD-10-CM | POA: Insufficient documentation

## 2019-05-08 DIAGNOSIS — E1129 Type 2 diabetes mellitus with other diabetic kidney complication: Secondary | ICD-10-CM | POA: Insufficient documentation

## 2019-05-08 DIAGNOSIS — Z9483 Pancreas transplant status: Secondary | ICD-10-CM | POA: Insufficient documentation

## 2019-05-08 DIAGNOSIS — Z789 Other specified health status: Secondary | ICD-10-CM | POA: Insufficient documentation

## 2019-05-08 LAB — CBC WITH DIFFERENTIAL/PLATELET
Abs Immature Granulocytes: 0.37 10*3/uL — ABNORMAL HIGH (ref 0.00–0.07)
Basophils Absolute: 0 10*3/uL (ref 0.0–0.1)
Basophils Relative: 0 %
Eosinophils Absolute: 0 10*3/uL (ref 0.0–0.5)
Eosinophils Relative: 0 %
HCT: 30.1 % — ABNORMAL LOW (ref 39.0–52.0)
Hemoglobin: 9.9 g/dL — ABNORMAL LOW (ref 13.0–17.0)
Immature Granulocytes: 7 %
Lymphocytes Relative: 1 %
Lymphs Abs: 0.1 10*3/uL — ABNORMAL LOW (ref 0.7–4.0)
MCH: 30.3 pg (ref 26.0–34.0)
MCHC: 32.9 g/dL (ref 30.0–36.0)
MCV: 92 fL (ref 80.0–100.0)
Monocytes Absolute: 0.2 10*3/uL (ref 0.1–1.0)
Monocytes Relative: 4 %
Neutro Abs: 4.7 10*3/uL (ref 1.7–7.7)
Neutrophils Relative %: 88 %
Platelets: 194 10*3/uL (ref 150–400)
RBC: 3.27 MIL/uL — ABNORMAL LOW (ref 4.22–5.81)
RDW: 15.4 % (ref 11.5–15.5)
Smear Review: NORMAL
WBC: 5.3 10*3/uL (ref 4.0–10.5)
nRBC: 0 % (ref 0.0–0.2)

## 2019-05-08 LAB — BASIC METABOLIC PANEL
Anion gap: 9 (ref 5–15)
BLOOD UREA NITROGEN: 38 mg/dL — ABNORMAL HIGH
BLOOD UREA NITROGEN: 31 mg/dL — ABNORMAL HIGH
BUN: 31 mg/dL — ABNORMAL HIGH (ref 6–20)
CALCIUM: 8.7 mg/dL — ABNORMAL LOW
CHLORIDE: 103 mmol/L
CO2: 25 mmol/L
CO2: 25 mmol/L
CO2: 25 mmol/L (ref 22–32)
CREATININE: 1.91 mg/dL — ABNORMAL HIGH
CREATININE: 1.72 mg/dL — ABNORMAL HIGH
Calcium: 9.2 mg/dL (ref 8.9–10.3)
Chloride: 103 mmol/L (ref 98–111)
Creatinine, Ser: 1.72 mg/dL — ABNORMAL HIGH (ref 0.61–1.24)
EGFR CKD-EPI AA MALE: 51 mL/min/{1.73_m2} — ABNORMAL LOW
EGFR CKD-EPI NON-AA MALE: 44 mL/min/{1.73_m2} — ABNORMAL LOW
EGFR CKD-EPI NON-AA MALE: 50 mL/min/{1.73_m2} — ABNORMAL LOW
GFR calc Af Amer: 58 mL/min — ABNORMAL LOW (ref >60)
GFR calc non Af Amer: 50 mL/min — ABNORMAL LOW (ref >60)
GLUCOSE RANDOM: 113 mg/dL — ABNORMAL HIGH
Glucose, Bld: 113 mg/dL — ABNORMAL HIGH (ref 70–99)
POTASSIUM: 4.2 mmol/L
POTASSIUM: 4.2 mmol/L
Potassium: 4.2 mmol/L (ref 3.5–5.1)
SODIUM: 137 mmol/L
Sodium: 137 mmol/L (ref 135–145)

## 2019-05-08 LAB — TACROLIMUS LEVEL: Tacrolimus (FK506) - LabCorp: 5.7 ng/mL (ref 2.0–20.0)

## 2019-05-08 LAB — PHOSPHORUS
Lab: 2.1 — ABNORMAL LOW
Lab: 2.2 — ABNORMAL LOW
Phosphorus: 2.2 mg/dL — ABNORMAL LOW (ref 2.5–4.6)

## 2019-05-08 LAB — MAGNESIUM
Lab: 1.9
Lab: 2.1
MAGNESIUM: 1.9 mg/dL
Magnesium: 1.9 mg/dL (ref 1.7–2.4)

## 2019-05-08 LAB — CBC W/ DIFFERENTIAL
BASOPHILS ABSOLUTE COUNT: 0 10*9/L
BASOPHILS ABSOLUTE COUNT: 0 10*9/L
BASOPHILS RELATIVE PERCENT: 0 %
BASOPHILS RELATIVE PERCENT: 0 %
EOSINOPHILS ABSOLUTE COUNT: 0 10*9/L
EOSINOPHILS ABSOLUTE COUNT: 0 10*9/L
EOSINOPHILS RELATIVE PERCENT: 0 %
EOSINOPHILS RELATIVE PERCENT: 0 %
HEMATOCRIT: 31.5 % — ABNORMAL LOW
HEMATOCRIT: 30.1 % — ABNORMAL LOW
HEMOGLOBIN: 10.2 g/dL — ABNORMAL LOW
HEMOGLOBIN: 9.9 g/dL — ABNORMAL LOW
LYMPHOCYTES ABSOLUTE COUNT: 0.1 10*9/L — ABNORMAL LOW
LYMPHOCYTES ABSOLUTE COUNT: 0.1 10*9/L — ABNORMAL LOW
LYMPHOCYTES RELATIVE PERCENT: 1 %
LYMPHOCYTES RELATIVE PERCENT: 1 %
MEAN CORPUSCULAR HEMOGLOBIN CONC: 32.4 g/dL
MEAN CORPUSCULAR HEMOGLOBIN CONC: 32.9 g/dL
MEAN CORPUSCULAR HEMOGLOBIN: 30.9 pg
MEAN CORPUSCULAR HEMOGLOBIN: 30.3 pg
MEAN CORPUSCULAR VOLUME: 95.5 fL
MONOCYTES ABSOLUTE COUNT: 0.3 10*9/L
MONOCYTES ABSOLUTE COUNT: 0.2 10*9/L
MONOCYTES RELATIVE PERCENT: 3 %
MONOCYTES RELATIVE PERCENT: 4 %
NEUTROPHILS ABSOLUTE COUNT: 4.7 10*9/L
NEUTROPHILS RELATIVE PERCENT: 90 %
NEUTROPHILS RELATIVE PERCENT: 88 %
PLATELET COUNT: 202 10*9/L
PLATELET COUNT: 194 10*9/L
RED BLOOD CELL COUNT: 3.3 10*12/L — ABNORMAL LOW
RED BLOOD CELL COUNT: 3.72 10*12/L — ABNORMAL LOW
RED CELL DISTRIBUTION WIDTH: 15.5 %
RED CELL DISTRIBUTION WIDTH: 15.4 %
WBC ADJUSTED: 5.3 10*9/L

## 2019-05-08 LAB — EOSINOPHILS RELATIVE PERCENT: Lab: 0

## 2019-05-08 LAB — TACROLIMUS, TROUGH: Lab: 4.7

## 2019-05-08 LAB — CO2: Lab: 25

## 2019-05-08 LAB — CHLORIDE: Lab: 104

## 2019-05-08 LAB — MONOCYTES ABSOLUTE COUNT: Lab: 0.3

## 2019-05-08 MED ORDER — TACROLIMUS ER 1 MG TABLET,EXTENDED RELEASE 24 HR
ORAL_TABLET | 11 refills | 0 days | Status: CP
Start: 2019-05-08 — End: 2020-05-07

## 2019-05-08 MED ORDER — TACROLIMUS ER 4 MG TABLET,EXTENDED RELEASE 24 HR
ORAL_TABLET | 11 refills | 0 days | Status: CP
Start: 2019-05-08 — End: 2020-05-07

## 2019-05-08 NOTE — Unmapped (Signed)
Pt recently transplanted    ESRD:Other, Specify - ANCA positive vasculitis   HX/ Comorbidities: HTN  Diagnostic testing needing follow up: pre side it was noted that due to hx of cytoxan pt may need cystogram in 2024.  HM: none  SW/Psychosocial concerns: was seeing psych annually on pre side.  See FYI's

## 2019-05-08 NOTE — Unmapped (Signed)
Blood Culture #2  Order: [id number]  Status:  Preliminary result ????       No Growth at 4 days

## 2019-05-09 NOTE — Unmapped (Signed)
Blood Culture #2  Order: [id number]  Status:  Final result ????    No Growth at 5 days

## 2019-05-09 NOTE — Unmapped (Signed)
Reviewed tac level with H.Perry NP, called patient to confirm trough level, pt states he is taking 5mg  daily (1- 4mg  tablet and 1-1mg  tablet daily) per his medication card. Pt discharge summary on 05/02/2019 shows pt was prescribed Envarsus 15mg  daily. Per H. Marina Goodell NP, increasing Envarsus to 6mg  daily. Pt made aware, verbalized understanding. Updated prescription sent to [facility].

## 2019-05-10 DIAGNOSIS — Z79899 Other long term (current) drug therapy: Principal | ICD-10-CM

## 2019-05-10 DIAGNOSIS — Z940 Kidney transplant status: Principal | ICD-10-CM

## 2019-05-10 LAB — TACROLIMUS LEVEL: Tacrolimus (FK506) - LabCorp: 5.5 ng/mL (ref 2.0–20.0)

## 2019-05-11 ENCOUNTER — Encounter: Admit: 2019-05-11 | Discharge: 2019-05-11 | Payer: PRIVATE HEALTH INSURANCE

## 2019-05-11 ENCOUNTER — Encounter
Admit: 2019-05-11 | Discharge: 2019-05-11 | Payer: PRIVATE HEALTH INSURANCE | Attending: Student in an Organized Health Care Education/Training Program | Primary: Student in an Organized Health Care Education/Training Program

## 2019-05-11 DIAGNOSIS — Z79899 Other long term (current) drug therapy: Principal | ICD-10-CM

## 2019-05-11 DIAGNOSIS — Z940 Kidney transplant status: Principal | ICD-10-CM

## 2019-05-11 LAB — COMPREHENSIVE METABOLIC PANEL
ALBUMIN: 3.7 g/dL (ref 3.5–5.0)
ALT (SGPT): 40 U/L (ref <50)
ANION GAP: 6 mmol/L — ABNORMAL LOW (ref 7–15)
AST (SGOT): 17 U/L — ABNORMAL LOW (ref 19–55)
BILIRUBIN TOTAL: 0.5 mg/dL (ref 0.0–1.2)
BLOOD UREA NITROGEN: 34 mg/dL — ABNORMAL HIGH (ref 7–21)
BUN / CREAT RATIO: 20
CALCIUM: 9.4 mg/dL (ref 8.5–10.2)
CHLORIDE: 102 mmol/L (ref 98–107)
CO2: 28 mmol/L (ref 22.0–30.0)
CREATININE: 1.7 mg/dL — ABNORMAL HIGH (ref 0.70–1.30)
EGFR CKD-EPI AA MALE: 59 mL/min/{1.73_m2} — ABNORMAL LOW (ref >=60)
EGFR CKD-EPI NON-AA MALE: 51 mL/min/{1.73_m2} — ABNORMAL LOW (ref >=60)
POTASSIUM: 4.2 mmol/L (ref 3.5–5.0)
PROTEIN TOTAL: 6.4 g/dL — ABNORMAL LOW (ref 6.5–8.3)
SODIUM: 136 mmol/L (ref 135–145)

## 2019-05-11 LAB — CBC W/ AUTO DIFF
BASOPHILS ABSOLUTE COUNT: 0 10*9/L (ref 0.0–0.1)
BASOPHILS RELATIVE PERCENT: 0.4 %
EOSINOPHILS ABSOLUTE COUNT: 0 10*9/L (ref 0.0–0.4)
EOSINOPHILS RELATIVE PERCENT: 0.2 %
HEMATOCRIT: 34.7 % — ABNORMAL LOW (ref 41.0–53.0)
HEMOGLOBIN: 10.9 g/dL — ABNORMAL LOW (ref 13.5–17.5)
LARGE UNSTAINED CELLS: 0 % (ref 0–4)
LYMPHOCYTES ABSOLUTE COUNT: 0.1 10*9/L — ABNORMAL LOW (ref 1.5–5.0)
LYMPHOCYTES RELATIVE PERCENT: 1 %
MEAN CORPUSCULAR HEMOGLOBIN CONC: 31.3 g/dL (ref 31.0–37.0)
MEAN CORPUSCULAR HEMOGLOBIN: 30.4 pg (ref 26.0–34.0)
MEAN PLATELET VOLUME: 7.4 fL (ref 7.0–10.0)
MONOCYTES ABSOLUTE COUNT: 0.2 10*9/L (ref 0.2–0.8)
MONOCYTES RELATIVE PERCENT: 3.2 %
NEUTROPHILS ABSOLUTE COUNT: 7.2 10*9/L (ref 2.0–7.5)
NEUTROPHILS RELATIVE PERCENT: 95 %
PLATELET COUNT: 286 10*9/L (ref 150–440)
RED CELL DISTRIBUTION WIDTH: 17.1 % — ABNORMAL HIGH (ref 12.0–15.0)
WBC ADJUSTED: 7.6 10*9/L (ref 4.5–11.0)

## 2019-05-11 LAB — URINALYSIS
BACTERIA: NONE SEEN /HPF
BILIRUBIN UA: NEGATIVE
BLOOD UA: NEGATIVE
GLUCOSE UA: NEGATIVE
KETONES UA: NEGATIVE
LEUKOCYTE ESTERASE UA: NEGATIVE
PH UA: 5 (ref 5.0–9.0)
PROTEIN UA: 30 — AB
RBC UA: 4 /HPF — ABNORMAL HIGH (ref <=3)
SPECIFIC GRAVITY UA: 1.021 (ref 1.003–1.030)
SQUAMOUS EPITHELIAL: <1 /HPF (ref 0–5)
UROBILINOGEN UA: 0.2
WBC UA: 2 /HPF (ref <=2)

## 2019-05-11 LAB — TACROLIMUS, TROUGH: Lab: 8.6

## 2019-05-11 LAB — MAGNESIUM: Magnesium:MCnc:Pt:Ser/Plas:Qn:: 1.8

## 2019-05-11 LAB — PROTEIN / CREATININE RATIO, URINE: PROTEIN URINE: 31.1 mg/dL

## 2019-05-11 LAB — PHOSPHORUS
PHOSPHORUS: 3.1 mg/dL (ref 2.9–4.7)
Phosphate:MCnc:Pt:Ser/Plas:Qn:: 3.1

## 2019-05-11 LAB — COLOR

## 2019-05-11 LAB — PROTEIN URINE: Protein:MCnc:Pt:Urine:Qn:: 31.1

## 2019-05-11 LAB — BASOPHILS ABSOLUTE COUNT: Basophils:NCnc:Pt:Bld:Qn:Automated count: 0

## 2019-05-11 LAB — ALBUMIN: Albumin:MCnc:Pt:Ser/Plas:Qn:: 3.7

## 2019-05-11 MED ORDER — OXYCODONE 5 MG TABLET
ORAL_TABLET | Freq: Four times a day (QID) | ORAL | 0 refills | 7 days | Status: CP | PRN
Start: 2019-05-11 — End: 2019-05-18

## 2019-05-11 MED ORDER — GABAPENTIN 100 MG CAPSULE
ORAL_CAPSULE | Freq: Three times a day (TID) | ORAL | 0 refills | 30.00000 days | Status: CP
Start: 2019-05-11 — End: 2019-06-10

## 2019-05-11 MED ORDER — ACETAMINOPHEN 500 MG TABLET
ORAL_TABLET | Freq: Four times a day (QID) | ORAL | 0 refills | 13 days | Status: CP | PRN
Start: 2019-05-11 — End: 2020-05-10
  Filled 2019-05-27: qty 30, 30d supply, fill #0

## 2019-05-11 NOTE — Unmapped (Signed)
Addended by: Thayer Headings L on: 05/11/2019 11:39 AM     Modules accepted: Orders

## 2019-05-11 NOTE — Unmapped (Signed)
Change in Envarsus dosage from 12 mg daily to 6 mg daily. Co-pay $0.00. Last filled at [facility] 05/01/2019

## 2019-05-11 NOTE — Unmapped (Signed)
Urine has been collected and sent to lab.  

## 2019-05-11 NOTE — Unmapped (Signed)
1200: Foley removed and patient able to void after removal. Unable to perform bladder scan due to patient leaving Transplant Clinic.

## 2019-05-11 NOTE — Unmapped (Signed)
[facility] Clinical Assessment & Refill Coordination Note    Ryan Moon, DOB: 1985-06-09  Phone: [phone] (home)     All above HIPAA information was verified with patient.     Was a Nurse, learning disability used for this call? No    Specialty Medication(s):   Transplant: Envarsus 1mg , Envarsus 4mg , Myfortic 180mg  and valgancyclovir 450mg      Current Outpatient Medications   Medication Sig Dispense Refill   ??? ALPRAZolam (XANAX) 0.25 MG tablet Take 0.5 mg by mouth every twelve (12) hours.      ??? amLODIPine (NORVASC) 10 MG tablet Take 10 mg by mouth daily.     ??? aspirin (ECOTRIN) 81 MG tablet Take 1 tablet (81 mg total) by mouth daily. 30 tablet 11   ??? cloNIDine HCL (CATAPRES) 0.2 MG tablet Take 1 tablet (0.2 mg total) by mouth Three (3) times a day. 90 tablet 11   ??? docusate sodium (COLACE) 100 MG capsule Take 1 capsule (100 mg total) by mouth two (2) times a day as needed for constipation. 60 capsule 0   ??? escitalopram oxalate (LEXAPRO) 10 MG tablet Take 15 mg by mouth.     ??? gabapentin (NEURONTIN) 100 MG capsule Take 1 capsule (100 mg total) by mouth nightly for 12 days. 12 capsule 0   ??? hydrALAZINE (APRESOLINE) 100 MG tablet Take 1 tablet (100 mg total) by mouth Three (3) times a day. 90 tablet 11   ??? magnesium oxide-Mg AA chelate (MAGNESIUM, AMINO ACID CHELATE,) 133 mg Tab Take 1 tablet by mouth Two (2) times a day. 60 tablet 11   ??? mycophenolate (MYFORTIC) 180 MG EC tablet Take 3 tablets (540 mg total) by mouth Two (2) times a day. 180 tablet 11   ??? omeprazole (PRILOSEC) 20 MG capsule Take 20 mg by mouth two (2) times a day.      ??? oxyCODONE (ROXICODONE) 5 MG immediate release tablet Take 1-2 tablets (5-10 mg total) by mouth every six (6) hours as needed. 40 tablet 0   ??? polyethylene glycol (GLYCOLAX) 17 gram/dose powder Dissolve 1 capful (17g) in 8 oz of juice or water daily as needed 510 g 0   ??? predniSONE (DELTASONE) 10 MG tablet Take 10 mg by mouth daily.   3 ??? sulfamethoxazole-trimethoprim (BACTRIM) 400-80 mg per tablet Take 1 tablet (80 mg of trimethoprim total) by mouth 3 (three) times a week. 12 tablet 5   ??? tacrolimus (ENVARSUS XR) 1 mg Tb24 extended release tablet Take 2 tablets (2 mg) by mouth daily with 1 (4 mg) tablets for total dose 6mg  daily. 60 tablet 11   ??? tacrolimus (ENVARSUS XR) 4 mg Tb24 extended release tablet Take 1 tablet (4 mg) by mouth daily with 2 (1 mg) tablets for total dose 6mg  daily. 30 tablet 11   ??? tamsulosin (FLOMAX) 0.4 mg capsule Take 1 capsule (0.4 mg total) by mouth daily. 30 capsule 0   ??? valGANciclovir (VALCYTE) 450 mg tablet Take 1 tablet (450 mg total) by mouth daily. 30 tablet 2     No current facility-administered medications for this visit.         Changes to medications: see envarsus below    Allergies   Allergen Reactions   ??? Acetaminophen Nausea And Vomiting       Changes to allergies: No    SPECIALTY MEDICATION ADHERENCE     envarsus 1mg   : 3 weeks of medicine on hand   envarsus 4mg   :  3 weeks of medicine on hand   mycophenolat 180mg   : 3 weeks of medicine on hand   valganciclovir 450mg   : 3 weeks of medicine on hand     Medication Adherence    Patient reported X missed doses in the last month: 0  Specialty Medication: envarsus 1mg   Patient is on additional specialty medications: Yes  Additional Specialty Medications: envarsus 4mg   Patient Reported Additional Medication X Missed Doses in the Last Month: 0  Patient is on more than two specialty medications: Yes  Specialty Medication: myfortic 180mg   Patient Reported Additional Medication X Missed Doses in the Last Month: 0  Specialty Medication: valganciclovir 450mg   Patient Reported Additional Medication X Missed Doses in the Last Month: 0          Specialty medication(s) dose(s) confirmed: Patient reports changes to the regimen as follows: envarsus is now 6mg  daily     Are there any concerns with adherence? No    Adherence counseling provided? Not needed CLINICAL MANAGEMENT AND INTERVENTION      Clinical Benefit Assessment:    Do you feel the medicine is effective or helping your condition? Yes    Clinical Benefit counseling provided? Not needed    Adverse Effects Assessment:    Are you experiencing any side effects? No    Are you experiencing difficulty administering your medicine? No    Quality of Life Assessment:    How many days over the past month did your transplant  keep you from your normal activities? For example, brushing your teeth or getting up in the morning. 0    Have you discussed this with your provider? Not needed    Therapy Appropriateness:    Is therapy appropriate? Yes, therapy is appropriate and should be continued    DISEASE/MEDICATION-SPECIFIC INFORMATION      N/A    PATIENT SPECIFIC NEEDS     ? Does the patient have any physical, cognitive, or cultural barriers? No    ? Is the patient high risk? Yes, patient is taking a REMS drug. Medication is dispensed in compliance with REMS program.     ? Does the patient require a Care Management Plan? No     ? Does the patient require physician intervention or other additional services (i.e. nutrition, smoking cessation, social work)? No      SHIPPING     Specialty Medication(s) to be Shipped:   na    Other medication(s) to be shipped: na     Changes to insurance: No    Delivery Scheduled: Patient declined refill at this time due to pt wants call back in 2 weeks.     Medication will be delivered via \\na  to the confirmed 'na address in [place].    The patient will receive a drug information handout for each medication shipped and additional FDA Medication Guides as required.  Verified that patient has previously received a Conservation officer, historic buildings.    All of the patient's questions and concerns have been addressed.    [name]   [facility] Specialty Pharmacist

## 2019-05-11 NOTE — Unmapped (Signed)
Transplant Coordinator, Clinic Visit   Pt seen today by transplant nephrology for follow up, reviewed medications and symptoms.   Assessment  BP: 106/72 today, at home 110/83-148/97  Headache: no  Hand tremors: intermittent, mild  Numbness/tingling: pt states he had numbness to B hands and B feet x 1 day  Fevers: no  Chills/sweats: no  Shortness of breath: no  Chest pain or pressure: no  Palpitations: no  Nausea/vomiting: no  Diarrhea/constipation: no  UTI symptoms: no  Swelling: no  Pain: pt states his pain is 7/10, unable to bend over, taking Oxycodone 10mg  Q6hrs and Tylenol 1000mg  Q 6hrs with minimal effect  Incision: C/D/I, staples remain in place, no drainage noted, no s/s of infection  Drain: removed prior to discharge from hospital  Foley: placed during visit to ED post-op on 11/29, pt has f/u appointment with Urology on 12/15 @ 8:30am      Intake: 600-1035ml per day, encouraged patient to increase fluid intake to 80-100oz per day  Output: 700-951ml per day    Any new medications? no  Immunosuppressant last taken: 9am yesterday morning    Per Dr. Doyne Keel: Foley removal and TOV ordered, prescription for Oxycodone, Gabapentin 100mg  TID sent to local pharmacy. Follow up appointment in 2 weeks with Transplant Nephrology.     I spent a total of 20 minutes with Ryan Moon reviewing medications and symptoms.

## 2019-05-11 NOTE — Unmapped (Signed)
TRANSPLANT SURGERY PROGRESS NOTE    Assessment and Plan  Ryan Moon is a 33 y.o. 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 His post-operative course was c/b return to ED for urinary retention with foley catheter replacement 7 days ago.    He is clinically doing well aside from ongoing RLQ pain, presumed 2/2 neuromuscular pain from surgery given unremarkable physical exam. Labs reveal downtrending creatinine, stable CBC, and no significantly aberrant electrolytes.     - Remove foley catheter, trial of void today  - Continue Tylenol scheduled, increase Gabapentin to 100mg  TID, reorder Oxycodoen 5-10mg  Q 6 hours prn  - Further medication changes per pharmacy  - Follow-up tacrolimus trough today, goal level 8-10  - Clinic follow-up in 3-4 weeks for staple removal  - Labs: 3x/ week      Subjective  Ryan Moon is a 33 y.o. 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 His post-operative course was c/b return to ED for urinary retention with foley catheter replacement 7 days ago. He complains of ongoing RLQ pain extending from the abdomen into the groin, not well managed with oxycodone. He denies fever, chills, nausea, vomiting, or bowel changes. He notes good urine output via foley catheter.       Objective    Vitals:    05/11/19 1004   BP: 106/72   Pulse: 71   Temp: 37.2 ??C (98.9 ??F)   TempSrc: Tympanic   Weight: 63 kg (139 lb)   Height: 177.8 cm (5' 10)      Body mass index is 19.94 kg/m??.     Physical Exam:    General Appearance:   No acute distress, appears well, in wheelchair  Lungs:                Normal work of breathing on room air  Heart:                           Regular rate and rhythm Abdomen:                Soft, RLQ kidney transplant incision and PD catheter incision sites clean and closed with staples without erythema or drainage, some tenderness to palpation of RLQ  Extremities:              Warm and well perfused, no significant edema        Data Review:  All lab results last 24 hours:    Recent Results (from the past 24 hour(s))   Comprehensive Metabolic Panel    Collection Time: 05/11/19  9:20 AM   Result Value Ref Range    Sodium 136 135 - 145 mmol/L    Potassium 4.2 3.5 - 5.0 mmol/L    Chloride 102 98 - 107 mmol/L    Anion Gap 6 (L) 7 - 15 mmol/L    CO2 28.0 22.0 - 30.0 mmol/L    BUN 34 (H) 7 - 21 mg/dL    Creatinine 2.95 (H) 0.70 - 1.30 mg/dL    BUN/Creatinine Ratio 20     EGFR CKD-EPI Non-African American, Male 51 (L) >=60 mL/min/1.17m2    EGFR CKD-EPI African American, Male 59 (L) >=60 mL/min/1.77m2    Glucose 80 70 - 99 mg/dL    Calcium 9.4 8.5 - 62.1 mg/dL    Albumin 3.7 3.5 - 5.0 g/dL  Total Protein 6.4 (L) 6.5 - 8.3 g/dL    Total Bilirubin 0.5 0.0 - 1.2 mg/dL    AST 17 (L) 19 - 55 U/L    ALT 40 <50 U/L    Alkaline Phosphatase 81 38 - 126 U/L   Magnesium Level    Collection Time: 05/11/19  9:20 AM   Result Value Ref Range    Magnesium 1.8 1.6 - 2.2 mg/dL   Phosphorus Level    Collection Time: 05/11/19  9:20 AM   Result Value Ref Range    Phosphorus 3.1 2.9 - 4.7 mg/dL   CBC w/ Differential    Collection Time: 05/11/19  9:20 AM   Result Value Ref Range    WBC 7.6 4.5 - 11.0 10*9/L    RBC 3.57 (L) 4.50 - 5.90 10*12/L    HGB 10.9 (L) 13.5 - 17.5 g/dL    HCT 04.5 (L) 40.9 - 53.0 %    MCV 97.0 80.0 - 100.0 fL    MCH 30.4 26.0 - 34.0 pg    MCHC 31.3 31.0 - 37.0 g/dL    RDW 81.1 (H) 91.4 - 15.0 %    MPV 7.4 7.0 - 10.0 fL    Platelet 286 150 - 440 10*9/L    Neutrophils % 95.0 %    Lymphocytes % 1.0 %    Monocytes % 3.2 %    Eosinophils % 0.2 %    Basophils % 0.4 %    Neutrophil Left Shift 1+ (A) Not Present    Absolute Neutrophils 7.2 2.0 - 7.5 10*9/L Absolute Lymphocytes 0.1 (L) 1.5 - 5.0 10*9/L    Absolute Monocytes 0.2 0.2 - 0.8 10*9/L    Absolute Eosinophils 0.0 0.0 - 0.4 10*9/L    Absolute Basophils 0.0 0.0 - 0.1 10*9/L    Large Unstained Cells 0 0 - 4 %    Macrocytosis Moderate (A) Not Present    Anisocytosis Slight (A) Not Present    Hypochromasia Slight (A) Not Present         Imaging: none

## 2019-05-11 NOTE — Unmapped (Signed)
[facility] CLINIC PHARMACY NOTE  05/11/2019   Ryan Moon  [id number]    Medication changes today:   1. Adjust Envarsus back to 6 mg daily (had been taking 15 mg for at least 2, maybe 3 days)  2. Increase gabapentin to 100 mg TID    Education/Adherence tools provided today:  1.provided updated medication list  2. provided additional pill box education  3.  provided additional education on immunosuppression and transplant related medications including reviewing indications of medications, dosing and side effects    Follow up items:  1. goal of understanding indications and dosing of immunosuppression medications  2. Pain  3.    Next visit with pharmacy in 1-2 weeks  ____________________________________________________________________    Ryan Moon is a 33 y.o. 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  He presented to the ED 1 day transplant hospitalization discharge for abdominal pain and dysuria.  Renal U/S showed interval decrease perinephric fluid collection but a large post void residual.  He was started on Flomax and a foley catheter was placed.    Seen by pharmacy today for: medication management and pill box fill and adherence education; last seen by pharmacy first visit     CC:  Patient complains of sharp throbbing pain around and below incision site    There were no vitals filed for this visit.    Allergies   Allergen Reactions   ??? Acetaminophen Nausea And Vomiting       All medications reviewed and updated.     Medication list includes revisions made during today???s encounter    Outpatient Encounter Medications as of 05/11/2019   Medication Sig Dispense Refill   ??? ALPRAZolam (XANAX) 0.25 MG tablet Take 0.5 mg by mouth every twelve (12) hours.      ??? amLODIPine (NORVASC) 10 MG tablet Take 10 mg by mouth daily.     ??? aspirin (ECOTRIN) 81 MG tablet Take 1 tablet (81 mg total) by mouth daily. 30 tablet 11   ??? cloNIDine HCL (CATAPRES) 0.2 MG tablet Take 1 tablet (0.2 mg total) by mouth Three (3) times a day. 90 tablet 11   ??? docusate sodium (COLACE) 100 MG capsule Take 1 capsule (100 mg total) by mouth two (2) times a day as needed for constipation. 60 capsule 0   ??? escitalopram oxalate (LEXAPRO) 10 MG tablet Take 15 mg by mouth.     ??? gabapentin (NEURONTIN) 100 MG capsule Take 1 capsule (100 mg total) by mouth nightly for 12 days. 12 capsule 0   ??? hydrALAZINE (APRESOLINE) 100 MG tablet Take 1 tablet (100 mg total) by mouth Three (3) times a day. 90 tablet 11   ??? magnesium oxide-Mg AA chelate (MAGNESIUM, AMINO ACID CHELATE,) 133 mg Tab Take 1 tablet by mouth Two (2) times a day. 60 tablet 11   ??? mycophenolate (MYFORTIC) 180 MG EC tablet Take 3 tablets (540 mg total) by mouth Two (2) times a day. 180 tablet 11   ??? omeprazole (PRILOSEC) 20 MG capsule Take 20 mg by mouth two (2) times a day.      ??? oxyCODONE (ROXICODONE) 5 MG immediate release tablet Take 1-2 tablets (5-10 mg total) by mouth every six (6) hours  as needed. 40 tablet 0   ??? polyethylene glycol (GLYCOLAX) 17 gram/dose powder Dissolve 1 capful (17g) in 8 oz of juice or water daily as needed 510 g 0   ??? predniSONE (DELTASONE) 10 MG tablet Take 10 mg by mouth daily.   3   ??? sulfamethoxazole-trimethoprim (BACTRIM) 400-80 mg per tablet Take 1 tablet (80 mg of trimethoprim total) by mouth 3 (three) times a week. 12 tablet 5   ??? tacrolimus (ENVARSUS XR) 1 mg Tb24 extended release tablet Take 2 tablets (2 mg) by mouth daily with 1 (4 mg) tablets for total dose 6mg  daily. 60 tablet 11   ??? tacrolimus (ENVARSUS XR) 4 mg Tb24 extended release tablet Take 1 tablet (4 mg) by mouth daily with 2 (1 mg) tablets for total dose 6mg  daily. 30 tablet 11   ??? tamsulosin (FLOMAX) 0.4 mg capsule Take 1 capsule (0.4 mg total) by mouth daily. 30 capsule 0   ??? valGANciclovir (VALCYTE) 450 mg tablet Take 1 tablet (450 mg total) by mouth daily. 30 tablet 2     No facility-administered encounter medications on file as of 05/11/2019.      Induction agent : alemtuzumab    CURRENT IMMUNOSUPPRESSION: Envarsus 6 mg PO qd is prescribed but had 15 mg in pill box (had filled pill box on Saturday so likely took 15 mg on Sunday and Monday because he followed instructions on bottle rather than med list and rather than listening to instructions provided by TNC on 12/4)  prograf/Envarsus/cyclosporine goal: 8-10   myfortic540  mg PO bid    prednisone 10 mg daily (pre transplant dose for ANCA vasculitis)    Patient is tolerating immunosuppression well    IMMUNOSUPPRESSION DRUG LEVELS:  Lab Results   Component Value Date    Tacrolimus, Trough 4.7 05/04/2019    Tacrolimus, Trough 7.3 05/03/2019    Tacrolimus, Trough 2.6 (L) 05/02/2019     No results found for: CYCLO  No results found for: EVEROLIMUS  No results found for: SIROLIMUS    Envarsus level is accurate 24 hour trough    Graft function: stable  DSA: ntd  Biopsies to date: zero hour biopsy with minimal arteriosclerosis  UPC: 0.207 on 12/7  WBC/ANC:  wnl    Plan: Will  adjust Envarsus back to 6 mg daily.  Counseled on importance of taking medications as instructed by med list rather than instructions on the Rx label. Continue to monitor.    OI Prophylaxis:   CMV Status: D+/ R+, moderate risk . CMV prophylaxis: valganciclovir 450 mg daily x 3 months per protocol.  Estimated Creatinine Clearance: 56.8 mL/min (A) (based on SCr of 1.72 mg/dL (H)).  No results found for: CMVCP  PCP Prophylaxis: bactrim SS 1 tab MWF x 6 months.  Thrush: completed in hospital  Patient is  tolerating infectious prophylaxis well    Plan: Continue per protocol. Continue to monitor.    CV Prophylaxis: asa 81 mg   The ASCVD Risk score Denman George DC Montez Hageman, et al., 2013) failed to calculate.  Statin therapy: Not indicated; currently on no statin  Plan:  Continue to monitor     BP: Goal < 140/90. Clinic vitals reported above  Home BP ranges: 110-130/70s  Current meds include: amlodipine 10 mg daily, hydralazine 100 mg TID, clonidine 0.2 mg TID   Plan: within goal. Continue to monitor    Anemia of CKD:  H/H:   Lab Results   Component Value Date    HGB 9.9 (  L) 05/08/2019     Lab Results   Component Value Date    HCT 30.1 (L) 05/08/2019     Iron panel:  No results found for: IRON, TIBC, FERRITIN  No results found for: LABIRON    Prior ESA use: none post transplant    Plan: within goal. Continue to monitor.     DM:   Lab Results   Component Value Date    A1C <4.0 (L) 04/16/2019   . Goal A1c < 7  History of Dm? No  Diet:did not address  Exercise:not yet  Plan:  Continue to monitor     Fluid intake: 40-50 oz daily  Plan:Increase fluid intake to 80-100 oz daily    Electrolytes: phos 2.2  Meds currently on: mg plus protein to 133 mg BID  Plan:Continue to monitor     GI/BM: pt reports no diarrhea; has normal BM; GERD well controlled  Meds currently on: docusate PRN, Miralax PRN (not using), omeprazole 20 mg BID (on pre transplant)  Plan: Continue to monitor    Pain: pt reports significant incisional site pain  Meds currently on: APAP PRN (using 1g TID), oxycodone PRN (using 10g q6h), gabapentin per protocol  Plan: Provided refill for oxycodone.  Increase gabapentin to 100 mg TID. Continue to monitor    Bone health:   Vitamin D Level: pending. Goal > 30.   Last DEXA results:  none available  Current meds include: none  Plan: Vitamin D level  pending. Continue to monitor.     Women's/Men's Health:  Ryan Moon is a 33 y.o. male. Patient reports no men's/women's health issues  Plan: Continue to monitor    Urinary retention  Meds currently on: tamsulosin 0.4 mg HS  Plan: Pulled Foley tolday    Anxiety  Meds currently on: escitalopram 15 mg daily, alprazolam 0.5 mg BID PRN   **Reports smoking marijuana twice to manage pain since discharge  Plan: Warned against use of marijuana given infection concerns + DDI with tacrolimus. continue to monitor    Pharmacy preference:  [facility] for Envarsus and Myfortic, [facility] for all other meds     Adherence: Patient has poor understanding of medications; was not able to independently identify names/doses of immunosuppressants and OI meds.  Patient  does fill their own pill box on a regular basis at home.  His wife also helps with meds but she did not assist with this wee's pill box fill  Patient brought medication card:no  Pill [id number] - had Envarsus 15 mg in pill box because he followed instructions on med label rather than med list  Plan: Reviewed importance of using med list for pill box fill rather than following instructions on Rx label.  Requested that his wife review pill box for now.  Provided extensive adherence counseling/intervention    Spent approximately 40 minutes on educating this patient and greater than 50% was spent in direct face to face counseling regarding post transplant medication education. Questions and concerns were address to patient's satisfaction.    Patient was reviewed with Dr. Doyne Keel who was agreement with the stated plan:     During this visit, the following was completed:   BG log data assessment  BP log data assessment  Labs ordered and evaluated  complex treatment plan >1 DS   Patient education was completed for 11-24 minutes     All questions/concerns were addressed to the patient's satisfaction.  __________________________________________  Cleone Slim, PHARMD, CPP  [facility] [employer] TRANSPLANT CLINICAL  PHARMACIST PRACTITIONER  PAGER [phone]

## 2019-05-13 ENCOUNTER — Other Ambulatory Visit: Payer: Self-pay

## 2019-05-13 ENCOUNTER — Other Ambulatory Visit
Admission: RE | Admit: 2019-05-13 | Discharge: 2019-05-13 | Disposition: A | Discharge status: Home / Self Care | Payer: BLUE CROSS/BLUE SHIELD | Attending: Nephrology | Admitting: Nephrology

## 2019-05-13 DIAGNOSIS — Z0000 Encounter for general adult medical examination without abnormal findings: Principal | ICD-10-CM

## 2019-05-13 DIAGNOSIS — Z940 Kidney transplant status: Secondary | ICD-10-CM | POA: Diagnosis not present

## 2019-05-13 DIAGNOSIS — E1129 Type 2 diabetes mellitus with other diabetic kidney complication: Secondary | ICD-10-CM | POA: Insufficient documentation

## 2019-05-13 DIAGNOSIS — E559 Vitamin D deficiency, unspecified: Secondary | ICD-10-CM | POA: Insufficient documentation

## 2019-05-13 DIAGNOSIS — D631 Anemia in chronic kidney disease: Secondary | ICD-10-CM | POA: Insufficient documentation

## 2019-05-13 DIAGNOSIS — D899 Disorder involving the immune mechanism, unspecified: Secondary | ICD-10-CM | POA: Insufficient documentation

## 2019-05-13 DIAGNOSIS — Z79899 Other long term (current) drug therapy: Secondary | ICD-10-CM | POA: Insufficient documentation

## 2019-05-13 DIAGNOSIS — T8610 Unspecified complication of kidney transplant: Secondary | ICD-10-CM | POA: Insufficient documentation

## 2019-05-13 DIAGNOSIS — N390 Urinary tract infection, site not specified: Secondary | ICD-10-CM | POA: Insufficient documentation

## 2019-05-13 DIAGNOSIS — Z9483 Pancreas transplant status: Secondary | ICD-10-CM | POA: Diagnosis not present

## 2019-05-13 DIAGNOSIS — N189 Chronic kidney disease, unspecified: Secondary | ICD-10-CM | POA: Diagnosis not present

## 2019-05-13 DIAGNOSIS — Z114 Encounter for screening for human immunodeficiency virus [HIV]: Secondary | ICD-10-CM | POA: Insufficient documentation

## 2019-05-13 DIAGNOSIS — Z789 Other specified health status: Secondary | ICD-10-CM | POA: Insufficient documentation

## 2019-05-13 DIAGNOSIS — B259 Cytomegaloviral disease, unspecified: Secondary | ICD-10-CM | POA: Insufficient documentation

## 2019-05-13 DIAGNOSIS — Z09 Encounter for follow-up examination after completed treatment for conditions other than malignant neoplasm: Secondary | ICD-10-CM | POA: Diagnosis not present

## 2019-05-13 LAB — BASIC METABOLIC PANEL
Anion gap: 8 (ref 5–15)
BUN: 37 mg/dL — ABNORMAL HIGH (ref 6–20)
CO2: 25 mmol/L (ref 22–32)
Calcium: 9.1 mg/dL (ref 8.9–10.3)
Chloride: 105 mmol/L (ref 98–111)
Creatinine, Ser: 2.15 mg/dL — ABNORMAL HIGH (ref 0.61–1.24)
GFR calc Af Amer: 45 mL/min — ABNORMAL LOW (ref >60)
GFR calc non Af Amer: 38 mL/min — ABNORMAL LOW (ref >60)
Glucose, Bld: 87 mg/dL (ref 70–99)
Potassium: 4.1 mmol/L (ref 3.5–5.1)
Sodium: 138 mmol/L (ref 135–145)

## 2019-05-13 LAB — CBC WITH DIFFERENTIAL/PLATELET
Abs Immature Granulocytes: 0.1 10*3/uL — ABNORMAL HIGH (ref 0.00–0.07)
Basophils Absolute: 0 10*3/uL (ref 0.0–0.1)
Basophils Relative: 1 %
Eosinophils Absolute: 0 10*3/uL (ref 0.0–0.5)
Eosinophils Relative: 0 %
HCT: 28.6 % — ABNORMAL LOW (ref 39.0–52.0)
Hemoglobin: 9.5 g/dL — ABNORMAL LOW (ref 13.0–17.0)
Immature Granulocytes: 2 %
Lymphocytes Relative: 1 %
Lymphs Abs: 0.1 10*3/uL — ABNORMAL LOW (ref 0.7–4.0)
MCH: 31.3 pg (ref 26.0–34.0)
MCHC: 33.2 g/dL (ref 30.0–36.0)
MCV: 94.1 fL (ref 80.0–100.0)
Monocytes Absolute: 0.2 10*3/uL (ref 0.1–1.0)
Monocytes Relative: 3 %
Neutro Abs: 6.2 10*3/uL (ref 1.7–7.7)
Neutrophils Relative %: 93 %
Platelets: 214 10*3/uL (ref 150–400)
RBC: 3.04 MIL/uL — ABNORMAL LOW (ref 4.22–5.81)
RDW: 15.3 % (ref 11.5–15.5)
WBC: 6.7 10*3/uL (ref 4.0–10.5)
nRBC: 0 % (ref 0.0–0.2)

## 2019-05-13 LAB — PHOSPHORUS: Phosphorus: 2.8 mg/dL (ref 2.5–4.6)

## 2019-05-13 LAB — MAGNESIUM: Magnesium: 1.9 mg/dL (ref 1.7–2.4)

## 2019-05-15 ENCOUNTER — Other Ambulatory Visit
Admission: RE | Admit: 2019-05-15 | Discharge: 2019-05-15 | Disposition: A | Discharge status: Home / Self Care | Payer: BLUE CROSS/BLUE SHIELD | Source: Ambulatory Visit | Attending: Nephrology | Admitting: Nephrology

## 2019-05-15 DIAGNOSIS — Z940 Kidney transplant status: Principal | ICD-10-CM

## 2019-05-15 DIAGNOSIS — Z114 Encounter for screening for human immunodeficiency virus [HIV]: Secondary | ICD-10-CM | POA: Insufficient documentation

## 2019-05-15 DIAGNOSIS — E559 Vitamin D deficiency, unspecified: Secondary | ICD-10-CM | POA: Insufficient documentation

## 2019-05-15 DIAGNOSIS — B259 Cytomegaloviral disease, unspecified: Secondary | ICD-10-CM | POA: Insufficient documentation

## 2019-05-15 DIAGNOSIS — D899 Disorder involving the immune mechanism, unspecified: Secondary | ICD-10-CM | POA: Diagnosis present

## 2019-05-15 DIAGNOSIS — Z9483 Pancreas transplant status: Secondary | ICD-10-CM | POA: Diagnosis not present

## 2019-05-15 DIAGNOSIS — E1129 Type 2 diabetes mellitus with other diabetic kidney complication: Secondary | ICD-10-CM | POA: Insufficient documentation

## 2019-05-15 DIAGNOSIS — Z09 Encounter for follow-up examination after completed treatment for conditions other than malignant neoplasm: Secondary | ICD-10-CM | POA: Diagnosis not present

## 2019-05-15 DIAGNOSIS — Z789 Other specified health status: Secondary | ICD-10-CM | POA: Insufficient documentation

## 2019-05-15 DIAGNOSIS — Z79899 Other long term (current) drug therapy: Secondary | ICD-10-CM | POA: Insufficient documentation

## 2019-05-15 DIAGNOSIS — N189 Chronic kidney disease, unspecified: Secondary | ICD-10-CM | POA: Diagnosis not present

## 2019-05-15 DIAGNOSIS — N390 Urinary tract infection, site not specified: Secondary | ICD-10-CM | POA: Diagnosis not present

## 2019-05-15 DIAGNOSIS — T8610 Unspecified complication of kidney transplant: Secondary | ICD-10-CM | POA: Diagnosis not present

## 2019-05-15 LAB — CBC WITH DIFFERENTIAL/PLATELET
Abs Immature Granulocytes: 0.08 10*3/uL — ABNORMAL HIGH (ref 0.00–0.07)
Basophils Absolute: 0 10*3/uL (ref 0.0–0.1)
Basophils Relative: 1 %
Eosinophils Absolute: 0 10*3/uL (ref 0.0–0.5)
Eosinophils Relative: 0 %
HCT: 27.9 % — ABNORMAL LOW (ref 39.0–52.0)
Hemoglobin: 9.3 g/dL — ABNORMAL LOW (ref 13.0–17.0)
Immature Granulocytes: 2 %
Lymphocytes Relative: 1 %
Lymphs Abs: 0.1 10*3/uL — ABNORMAL LOW (ref 0.7–4.0)
MCH: 30.5 pg (ref 26.0–34.0)
MCHC: 33.3 g/dL (ref 30.0–36.0)
MCV: 91.5 fL (ref 80.0–100.0)
Monocytes Absolute: 0.2 10*3/uL (ref 0.1–1.0)
Monocytes Relative: 4 %
Neutro Abs: 5 10*3/uL (ref 1.7–7.7)
Neutrophils Relative %: 92 %
Platelets: 182 10*3/uL (ref 150–400)
RBC: 3.05 MIL/uL — ABNORMAL LOW (ref 4.22–5.81)
RDW: 15.3 % (ref 11.5–15.5)
WBC: 5.5 10*3/uL (ref 4.0–10.5)
nRBC: 0 % (ref 0.0–0.2)

## 2019-05-15 LAB — BASIC METABOLIC PANEL
Anion gap: 9 (ref 5–15)
BUN: 38 mg/dL — ABNORMAL HIGH (ref 6–20)
CO2: 25 mmol/L (ref 22–32)
Calcium: 9 mg/dL (ref 8.9–10.3)
Chloride: 105 mmol/L (ref 98–111)
Creatinine, Ser: 1.73 mg/dL — ABNORMAL HIGH (ref 0.61–1.24)
GFR calc Af Amer: 58 mL/min — ABNORMAL LOW (ref >60)
GFR calc non Af Amer: 50 mL/min — ABNORMAL LOW (ref >60)
Glucose, Bld: 125 mg/dL — ABNORMAL HIGH (ref 70–99)
Potassium: 4.1 mmol/L (ref 3.5–5.1)
Sodium: 139 mmol/L (ref 135–145)

## 2019-05-15 LAB — PHOSPHORUS: Phosphorus: 2.9 mg/dL (ref 2.5–4.6)

## 2019-05-15 LAB — MAGNESIUM: Magnesium: 1.8 mg/dL (ref 1.7–2.4)

## 2019-05-15 MED ORDER — GABAPENTIN 100 MG CAPSULE
ORAL_CAPSULE | Freq: Three times a day (TID) | ORAL | 0 refills | 30.00000 days | Status: CP
Start: 2019-05-15 — End: 2019-06-14

## 2019-05-15 MED ORDER — OXYCODONE 5 MG TABLET
ORAL_TABLET | Freq: Four times a day (QID) | ORAL | 0 refills | 5 days | Status: CP | PRN
Start: 2019-05-15 — End: 2019-05-20

## 2019-05-15 NOTE — Unmapped (Signed)
Pt paged on call TNC, TNC returned call and LM with pt VM.

## 2019-05-15 NOTE — Unmapped (Signed)
Pt called c/o 7/10 pain when moving. Pt states he will be out of oxycodone after tomorrow. Reviewed his pain with Dr. Doyne Keel and L. Mincemoyer Pharm D: pt will receive Oxycodone 5mg  Q6hrs x 5 days and increasing Gabapentin to 300mg  TID. Pt also scheduled for Transplant RUS for Monday 12/14 @ 1:30pm in the [facility]. Pt made aware he will not be receiving any additional Oxycodone after this prescription. Pt verbalized understanding.

## 2019-05-17 LAB — TACROLIMUS LEVEL: Tacrolimus (FK506) - LabCorp: 4.2 ng/mL (ref 2.0–20.0)

## 2019-05-17 NOTE — Unmapped (Signed)
Pt paged TNC on call, TNC attempted x4 to call pt and no answer, left VM

## 2019-05-18 ENCOUNTER — Ambulatory Visit: Admit: 2019-05-18 | Discharge: 2019-05-19 | Payer: PRIVATE HEALTH INSURANCE

## 2019-05-18 ENCOUNTER — Other Ambulatory Visit
Admission: RE | Admit: 2019-05-18 | Discharge: 2019-05-18 | Disposition: A | Discharge status: Home / Self Care | Payer: BLUE CROSS/BLUE SHIELD | Source: Ambulatory Visit | Attending: Nephrology | Admitting: Nephrology

## 2019-05-18 DIAGNOSIS — B259 Cytomegaloviral disease, unspecified: Secondary | ICD-10-CM | POA: Insufficient documentation

## 2019-05-18 DIAGNOSIS — T8610 Unspecified complication of kidney transplant: Secondary | ICD-10-CM | POA: Insufficient documentation

## 2019-05-18 DIAGNOSIS — Z789 Other specified health status: Secondary | ICD-10-CM | POA: Insufficient documentation

## 2019-05-18 DIAGNOSIS — D631 Anemia in chronic kidney disease: Secondary | ICD-10-CM | POA: Insufficient documentation

## 2019-05-18 DIAGNOSIS — Z9483 Pancreas transplant status: Secondary | ICD-10-CM | POA: Insufficient documentation

## 2019-05-18 DIAGNOSIS — Z79899 Other long term (current) drug therapy: Secondary | ICD-10-CM | POA: Diagnosis not present

## 2019-05-18 DIAGNOSIS — E559 Vitamin D deficiency, unspecified: Secondary | ICD-10-CM | POA: Insufficient documentation

## 2019-05-18 DIAGNOSIS — D899 Disorder involving the immune mechanism, unspecified: Secondary | ICD-10-CM | POA: Insufficient documentation

## 2019-05-18 DIAGNOSIS — Z114 Encounter for screening for human immunodeficiency virus [HIV]: Secondary | ICD-10-CM | POA: Diagnosis not present

## 2019-05-18 DIAGNOSIS — N390 Urinary tract infection, site not specified: Secondary | ICD-10-CM | POA: Diagnosis not present

## 2019-05-18 DIAGNOSIS — N189 Chronic kidney disease, unspecified: Secondary | ICD-10-CM | POA: Diagnosis not present

## 2019-05-18 DIAGNOSIS — Z940 Kidney transplant status: Secondary | ICD-10-CM | POA: Insufficient documentation

## 2019-05-18 DIAGNOSIS — E1129 Type 2 diabetes mellitus with other diabetic kidney complication: Secondary | ICD-10-CM | POA: Diagnosis not present

## 2019-05-18 LAB — CBC WITH DIFFERENTIAL/PLATELET
Abs Immature Granulocytes: 0.03 10*3/uL (ref 0.00–0.07)
Basophils Absolute: 0 10*3/uL (ref 0.0–0.1)
Basophils Relative: 0 %
Eosinophils Absolute: 0 10*3/uL (ref 0.0–0.5)
Eosinophils Relative: 0 %
HCT: 28 % — ABNORMAL LOW (ref 39.0–52.0)
Hemoglobin: 9.1 g/dL — ABNORMAL LOW (ref 13.0–17.0)
Immature Granulocytes: 1 %
Lymphocytes Relative: 1 %
Lymphs Abs: 0.1 10*3/uL — ABNORMAL LOW (ref 0.7–4.0)
MCH: 30.7 pg (ref 26.0–34.0)
MCHC: 32.5 g/dL (ref 30.0–36.0)
MCV: 94.6 fL (ref 80.0–100.0)
Monocytes Absolute: 0.2 10*3/uL (ref 0.1–1.0)
Monocytes Relative: 4 %
Neutro Abs: 4.4 10*3/uL (ref 1.7–7.7)
Neutrophils Relative %: 94 %
Platelets: 182 10*3/uL (ref 150–400)
RBC: 2.96 MIL/uL — ABNORMAL LOW (ref 4.22–5.81)
RDW: 15.3 % (ref 11.5–15.5)
WBC: 4.7 10*3/uL (ref 4.0–10.5)
nRBC: 0 % (ref 0.0–0.2)

## 2019-05-18 LAB — BASIC METABOLIC PANEL
Anion gap: 8 (ref 5–15)
BUN: 28 mg/dL — ABNORMAL HIGH (ref 6–20)
CO2: 25 mmol/L (ref 22–32)
Calcium: 8.9 mg/dL (ref 8.9–10.3)
Chloride: 104 mmol/L (ref 98–111)
Creatinine, Ser: 1.56 mg/dL — ABNORMAL HIGH (ref 0.61–1.24)
GFR calc Af Amer: >60 mL/min (ref >60)
GFR calc non Af Amer: 57 mL/min — ABNORMAL LOW (ref >60)
Glucose, Bld: 98 mg/dL (ref 70–99)
Potassium: 3.7 mmol/L (ref 3.5–5.1)
Sodium: 137 mmol/L (ref 135–145)

## 2019-05-18 LAB — PHOSPHORUS: Phosphorus: 2.4 mg/dL — ABNORMAL LOW (ref 2.5–4.6)

## 2019-05-18 LAB — MAGNESIUM: Magnesium: 1.7 mg/dL (ref 1.7–2.4)

## 2019-05-19 LAB — TACROLIMUS LEVEL: Tacrolimus (FK506) - LabCorp: 3.6 ng/mL (ref 2.0–20.0)

## 2019-05-19 LAB — CBC W/ DIFFERENTIAL
BASOPHILS ABSOLUTE COUNT: 0 10*9/L
BASOPHILS RELATIVE PERCENT: 1 %
EOSINOPHILS ABSOLUTE COUNT: 0 10*9/L
EOSINOPHILS RELATIVE PERCENT: 0 %
HEMATOCRIT: 27.9 % — ABNORMAL LOW
HEMOGLOBIN: 9.3 g/dL — ABNORMAL LOW
LYMPHOCYTES ABSOLUTE COUNT: 0.1 10*9/L — ABNORMAL LOW
LYMPHOCYTES RELATIVE PERCENT: 1 %
MEAN CORPUSCULAR HEMOGLOBIN CONC: 33.3 g/dL
MEAN CORPUSCULAR HEMOGLOBIN: 30.5 pg
MONOCYTES ABSOLUTE COUNT: 0.2 10*9/L
MONOCYTES RELATIVE PERCENT: 4 %
NEUTROPHILS ABSOLUTE COUNT: 5 10*9/L
NEUTROPHILS RELATIVE PERCENT: 92 %
RED BLOOD CELL COUNT: 3.05 10*12/L — ABNORMAL LOW
RED CELL DISTRIBUTION WIDTH: 15.3 %
WBC ADJUSTED: 5.5 10*9/L

## 2019-05-19 LAB — BASIC METABOLIC PANEL
BLOOD UREA NITROGEN: 38 mg/dL — ABNORMAL HIGH
CHLORIDE: 105 mmol/L
CO2: 25 mmol/L
CREATININE: 1.73 mg/dL — ABNORMAL HIGH
EGFR CKD-EPI AA MALE: 58 mL/min/{1.73_m2} — ABNORMAL LOW
EGFR CKD-EPI NON-AA MALE: 50 mL/min/{1.73_m2} — ABNORMAL LOW
GLUCOSE RANDOM: 125 mg/dL — ABNORMAL HIGH
POTASSIUM: 4.1 mmol/L

## 2019-05-19 LAB — MAGNESIUM: Lab: 1.8

## 2019-05-19 LAB — PHOSPHORUS: Lab: 2.9

## 2019-05-19 LAB — CO2: Lab: 25

## 2019-05-19 LAB — HYPOCHROMIA: Lab: 0

## 2019-05-19 MED ORDER — TRAMADOL 50 MG TABLET
ORAL_TABLET | Freq: Four times a day (QID) | ORAL | 0 refills | 7.00000 days | Status: CP | PRN
Start: 2019-05-19 — End: ?

## 2019-05-19 NOTE — Unmapped (Signed)
Pt called and states he is having pain in his groin and on the side of his incision, he is out of pain meds and his pain is 7-8, is currently taking gabapentin 300mg  TID and Tylenol 1000mg  TID. Pt states above the incision is a little swollen. Denies fevers, heat/warmth around incsion or painful area, no drainage from incision. Reviewed with Dr. Doyne Keel and L. Mincemoyer Pharm D, pt receiving Tramadol 50mg  Q6hrs x 5 days. Pt made aware, also will schedule pt to be seen in clinc by txp surgery.

## 2019-05-20 ENCOUNTER — Encounter: Admit: 2019-05-20 | Discharge: 2019-05-21 | Payer: PRIVATE HEALTH INSURANCE

## 2019-05-20 ENCOUNTER — Other Ambulatory Visit
Admission: RE | Admit: 2019-05-20 | Discharge: 2019-05-20 | Disposition: A | Discharge status: Home / Self Care | Payer: BLUE CROSS/BLUE SHIELD | Source: Ambulatory Visit | Attending: Nephrology | Admitting: Nephrology

## 2019-05-20 DIAGNOSIS — Z09 Encounter for follow-up examination after completed treatment for conditions other than malignant neoplasm: Secondary | ICD-10-CM | POA: Diagnosis not present

## 2019-05-20 DIAGNOSIS — E559 Vitamin D deficiency, unspecified: Secondary | ICD-10-CM | POA: Diagnosis not present

## 2019-05-20 DIAGNOSIS — Z789 Other specified health status: Secondary | ICD-10-CM | POA: Insufficient documentation

## 2019-05-20 DIAGNOSIS — D899 Disorder involving the immune mechanism, unspecified: Secondary | ICD-10-CM | POA: Diagnosis not present

## 2019-05-20 DIAGNOSIS — Z940 Kidney transplant status: Secondary | ICD-10-CM | POA: Insufficient documentation

## 2019-05-20 DIAGNOSIS — B259 Cytomegaloviral disease, unspecified: Secondary | ICD-10-CM | POA: Insufficient documentation

## 2019-05-20 DIAGNOSIS — Z114 Encounter for screening for human immunodeficiency virus [HIV]: Secondary | ICD-10-CM | POA: Diagnosis not present

## 2019-05-20 DIAGNOSIS — N390 Urinary tract infection, site not specified: Secondary | ICD-10-CM | POA: Insufficient documentation

## 2019-05-20 DIAGNOSIS — Z9483 Pancreas transplant status: Secondary | ICD-10-CM | POA: Insufficient documentation

## 2019-05-20 DIAGNOSIS — E1129 Type 2 diabetes mellitus with other diabetic kidney complication: Secondary | ICD-10-CM | POA: Insufficient documentation

## 2019-05-20 DIAGNOSIS — Z79899 Other long term (current) drug therapy: Secondary | ICD-10-CM | POA: Insufficient documentation

## 2019-05-20 LAB — BASIC METABOLIC PANEL
Anion gap: 9 (ref 5–15)
BUN: 25 mg/dL — ABNORMAL HIGH (ref 6–20)
CO2: 26 mmol/L (ref 22–32)
Calcium: 9 mg/dL (ref 8.9–10.3)
Chloride: 104 mmol/L (ref 98–111)
Creatinine, Ser: 1.49 mg/dL — ABNORMAL HIGH (ref 0.61–1.24)
GFR calc Af Amer: >60 mL/min (ref >60)
GFR calc non Af Amer: 60 mL/min — ABNORMAL LOW (ref >60)
Glucose, Bld: 94 mg/dL (ref 70–99)
Potassium: 3.6 mmol/L (ref 3.5–5.1)
Sodium: 139 mmol/L (ref 135–145)

## 2019-05-20 LAB — CBC WITH DIFFERENTIAL/PLATELET
Abs Immature Granulocytes: 0.04 10*3/uL (ref 0.00–0.07)
Basophils Absolute: 0 10*3/uL (ref 0.0–0.1)
Basophils Relative: 0 %
Eosinophils Absolute: 0 10*3/uL (ref 0.0–0.5)
Eosinophils Relative: 0 %
HCT: 28.9 % — ABNORMAL LOW (ref 39.0–52.0)
Hemoglobin: 9.5 g/dL — ABNORMAL LOW (ref 13.0–17.0)
Immature Granulocytes: 1 %
Lymphocytes Relative: 1 %
Lymphs Abs: 0.1 10*3/uL — ABNORMAL LOW (ref 0.7–4.0)
MCH: 31 pg (ref 26.0–34.0)
MCHC: 32.9 g/dL (ref 30.0–36.0)
MCV: 94.4 fL (ref 80.0–100.0)
Monocytes Absolute: 0.2 10*3/uL (ref 0.1–1.0)
Monocytes Relative: 4 %
Neutro Abs: 4 10*3/uL (ref 1.7–7.7)
Neutrophils Relative %: 94 %
Platelets: 191 10*3/uL (ref 150–400)
RBC: 3.06 MIL/uL — ABNORMAL LOW (ref 4.22–5.81)
RDW: 15.8 % — ABNORMAL HIGH (ref 11.5–15.5)
WBC: 4.3 10*3/uL (ref 4.0–10.5)
nRBC: 0 % (ref 0.0–0.2)

## 2019-05-20 LAB — PHOSPHORUS: Phosphorus: 2.6 mg/dL (ref 2.5–4.6)

## 2019-05-20 LAB — MAGNESIUM: Magnesium: 1.7 mg/dL (ref 1.7–2.4)

## 2019-05-22 ENCOUNTER — Encounter: Admit: 2019-05-22 | Discharge: 2019-05-23 | Payer: PRIVATE HEALTH INSURANCE

## 2019-05-22 ENCOUNTER — Ambulatory Visit: Admit: 2019-05-22 | Discharge: 2019-05-23 | Payer: PRIVATE HEALTH INSURANCE

## 2019-05-22 ENCOUNTER — Other Ambulatory Visit
Admission: RE | Admit: 2019-05-22 | Discharge: 2019-05-22 | Disposition: A | Discharge status: Home / Self Care | Payer: BLUE CROSS/BLUE SHIELD | Attending: Nephrology | Admitting: Nephrology

## 2019-05-22 DIAGNOSIS — E1129 Type 2 diabetes mellitus with other diabetic kidney complication: Secondary | ICD-10-CM | POA: Diagnosis not present

## 2019-05-22 DIAGNOSIS — N390 Urinary tract infection, site not specified: Secondary | ICD-10-CM | POA: Insufficient documentation

## 2019-05-22 DIAGNOSIS — Z940 Kidney transplant status: Secondary | ICD-10-CM | POA: Insufficient documentation

## 2019-05-22 DIAGNOSIS — E559 Vitamin D deficiency, unspecified: Secondary | ICD-10-CM | POA: Insufficient documentation

## 2019-05-22 DIAGNOSIS — B259 Cytomegaloviral disease, unspecified: Secondary | ICD-10-CM | POA: Diagnosis not present

## 2019-05-22 DIAGNOSIS — Z9483 Pancreas transplant status: Secondary | ICD-10-CM | POA: Insufficient documentation

## 2019-05-22 DIAGNOSIS — D899 Disorder involving the immune mechanism, unspecified: Secondary | ICD-10-CM | POA: Diagnosis not present

## 2019-05-22 DIAGNOSIS — Z09 Encounter for follow-up examination after completed treatment for conditions other than malignant neoplasm: Secondary | ICD-10-CM | POA: Diagnosis not present

## 2019-05-22 DIAGNOSIS — Z789 Other specified health status: Secondary | ICD-10-CM | POA: Diagnosis not present

## 2019-05-22 DIAGNOSIS — D631 Anemia in chronic kidney disease: Secondary | ICD-10-CM | POA: Insufficient documentation

## 2019-05-22 LAB — CBC WITH DIFFERENTIAL/PLATELET
Abs Immature Granulocytes: 0.04 10*3/uL (ref 0.00–0.07)
Basophils Absolute: 0 10*3/uL (ref 0.0–0.1)
Basophils Relative: 0 %
Eosinophils Absolute: 0 10*3/uL (ref 0.0–0.5)
Eosinophils Relative: 0 %
HCT: 27 % — ABNORMAL LOW (ref 39.0–52.0)
Hemoglobin: 8.8 g/dL — ABNORMAL LOW (ref 13.0–17.0)
Immature Granulocytes: 1 %
Lymphocytes Relative: 1 %
Lymphs Abs: 0.1 10*3/uL — ABNORMAL LOW (ref 0.7–4.0)
MCH: 31 pg (ref 26.0–34.0)
MCHC: 32.6 g/dL (ref 30.0–36.0)
MCV: 95.1 fL (ref 80.0–100.0)
Monocytes Absolute: 0.2 10*3/uL (ref 0.1–1.0)
Monocytes Relative: 4 %
Neutro Abs: 4.7 10*3/uL (ref 1.7–7.7)
Neutrophils Relative %: 94 %
Platelets: 186 10*3/uL (ref 150–400)
RBC: 2.84 MIL/uL — ABNORMAL LOW (ref 4.22–5.81)
RDW: 15.7 % — ABNORMAL HIGH (ref 11.5–15.5)
WBC: 5 10*3/uL (ref 4.0–10.5)
nRBC: 0 % (ref 0.0–0.2)

## 2019-05-22 LAB — PHOSPHORUS: Phosphorus: 2.5 mg/dL (ref 2.5–4.6)

## 2019-05-22 LAB — BASIC METABOLIC PANEL
Anion gap: 8 (ref 5–15)
BUN: 21 mg/dL — ABNORMAL HIGH (ref 6–20)
CO2: 26 mmol/L (ref 22–32)
Calcium: 9 mg/dL (ref 8.9–10.3)
Chloride: 103 mmol/L (ref 98–111)
Creatinine, Ser: 1.59 mg/dL — ABNORMAL HIGH (ref 0.61–1.24)
GFR calc Af Amer: >60 mL/min (ref >60)
GFR calc non Af Amer: 55 mL/min — ABNORMAL LOW (ref >60)
Glucose, Bld: 90 mg/dL (ref 70–99)
Potassium: 4.1 mmol/L (ref 3.5–5.1)
Sodium: 137 mmol/L (ref 135–145)

## 2019-05-22 LAB — MAGNESIUM: Magnesium: 1.6 mg/dL — ABNORMAL LOW (ref 1.7–2.4)

## 2019-05-22 NOTE — Unmapped (Signed)
Patient contacted Dawn, On call coordinator, to receive lab orders for [facility]. I put in the order and routed it to the preferred laboratory. Loma Boston, RN Inpatient Transplant Nurse Coordinator 05/22/2019 9:38 AM

## 2019-05-25 ENCOUNTER — Encounter: Admit: 2019-05-25 | Discharge: 2019-05-26 | Payer: PRIVATE HEALTH INSURANCE

## 2019-05-25 ENCOUNTER — Other Ambulatory Visit
Admission: RE | Admit: 2019-05-25 | Discharge: 2019-05-25 | Disposition: A | Discharge status: Home / Self Care | Payer: BLUE CROSS/BLUE SHIELD | Source: Ambulatory Visit | Attending: Nephrology | Admitting: Nephrology

## 2019-05-25 ENCOUNTER — Other Ambulatory Visit: Payer: Self-pay

## 2019-05-25 DIAGNOSIS — Z114 Encounter for screening for human immunodeficiency virus [HIV]: Secondary | ICD-10-CM | POA: Diagnosis not present

## 2019-05-25 DIAGNOSIS — Z9483 Pancreas transplant status: Secondary | ICD-10-CM | POA: Insufficient documentation

## 2019-05-25 DIAGNOSIS — N189 Chronic kidney disease, unspecified: Secondary | ICD-10-CM | POA: Insufficient documentation

## 2019-05-25 DIAGNOSIS — D631 Anemia in chronic kidney disease: Secondary | ICD-10-CM | POA: Insufficient documentation

## 2019-05-25 DIAGNOSIS — E559 Vitamin D deficiency, unspecified: Secondary | ICD-10-CM | POA: Diagnosis not present

## 2019-05-25 DIAGNOSIS — Z940 Kidney transplant status: Secondary | ICD-10-CM | POA: Insufficient documentation

## 2019-05-25 DIAGNOSIS — D899 Disorder involving the immune mechanism, unspecified: Secondary | ICD-10-CM | POA: Diagnosis not present

## 2019-05-25 DIAGNOSIS — Z789 Other specified health status: Secondary | ICD-10-CM | POA: Diagnosis not present

## 2019-05-25 DIAGNOSIS — B259 Cytomegaloviral disease, unspecified: Secondary | ICD-10-CM | POA: Diagnosis not present

## 2019-05-25 DIAGNOSIS — Z79899 Other long term (current) drug therapy: Secondary | ICD-10-CM | POA: Insufficient documentation

## 2019-05-25 DIAGNOSIS — T8610 Unspecified complication of kidney transplant: Secondary | ICD-10-CM | POA: Diagnosis not present

## 2019-05-25 DIAGNOSIS — E1129 Type 2 diabetes mellitus with other diabetic kidney complication: Secondary | ICD-10-CM | POA: Diagnosis not present

## 2019-05-25 DIAGNOSIS — N390 Urinary tract infection, site not specified: Secondary | ICD-10-CM | POA: Insufficient documentation

## 2019-05-25 LAB — CBC WITH DIFFERENTIAL/PLATELET
Abs Immature Granulocytes: 0.03 10*3/uL (ref 0.00–0.07)
Basophils Absolute: 0 10*3/uL (ref 0.0–0.1)
Basophils Relative: 0 %
Eosinophils Absolute: 0 10*3/uL (ref 0.0–0.5)
Eosinophils Relative: 1 %
HCT: 27.3 % — ABNORMAL LOW (ref 39.0–52.0)
Hemoglobin: 9.2 g/dL — ABNORMAL LOW (ref 13.0–17.0)
Immature Granulocytes: 1 %
Lymphocytes Relative: 1 %
Lymphs Abs: 0.1 10*3/uL — ABNORMAL LOW (ref 0.7–4.0)
MCH: 30.6 pg (ref 26.0–34.0)
MCHC: 33.7 g/dL (ref 30.0–36.0)
MCV: 90.7 fL (ref 80.0–100.0)
Monocytes Absolute: 0.2 10*3/uL (ref 0.1–1.0)
Monocytes Relative: 5 %
Neutro Abs: 3.5 10*3/uL (ref 1.7–7.7)
Neutrophils Relative %: 92 %
Platelets: 176 10*3/uL (ref 150–400)
RBC: 3.01 MIL/uL — ABNORMAL LOW (ref 4.22–5.81)
RDW: 15.6 % — ABNORMAL HIGH (ref 11.5–15.5)
WBC: 3.8 10*3/uL — ABNORMAL LOW (ref 4.0–10.5)
nRBC: 0 % (ref 0.0–0.2)

## 2019-05-25 LAB — PHOSPHORUS: Phosphorus: 2.9 mg/dL (ref 2.5–4.6)

## 2019-05-25 LAB — BASIC METABOLIC PANEL
Anion gap: 10 (ref 5–15)
BUN: 31 mg/dL — ABNORMAL HIGH (ref 6–20)
CO2: 23 mmol/L (ref 22–32)
Calcium: 9 mg/dL (ref 8.9–10.3)
Chloride: 106 mmol/L (ref 98–111)
Creatinine, Ser: 1.79 mg/dL — ABNORMAL HIGH (ref 0.61–1.24)
GFR calc Af Amer: 56 mL/min — ABNORMAL LOW (ref >60)
GFR calc non Af Amer: 48 mL/min — ABNORMAL LOW (ref >60)
Glucose, Bld: 103 mg/dL — ABNORMAL HIGH (ref 70–99)
Potassium: 3.7 mmol/L (ref 3.5–5.1)
Sodium: 139 mmol/L (ref 135–145)

## 2019-05-25 LAB — MAGNESIUM: Magnesium: 1.9 mg/dL (ref 1.7–2.4)

## 2019-05-25 NOTE — Unmapped (Signed)
[facility] Clinical Assessment & Refill Coordination Note    Ryan Moon, DOB: 04-11-1986  Phone: [phone] (home)     All above HIPAA information was verified with patient.     Was a Nurse, learning disability used for this call? No    Specialty Medication(s):   Transplant: Envarsus 1mg , Envarsus 4mg , Myfortic 180mg  and valgancyclovir 450mg      Current Outpatient Medications   Medication Sig Dispense Refill   ??? acetaminophen (TYLENOL EXTRA STRENGTH) 500 MG tablet Take 2 tablets (1,000 mg total) by mouth every six (6) hours as needed for pain or fever (> 38C). 100 tablet 0   ??? ALPRAZolam (XANAX) 0.25 MG tablet Take 0.5 mg by mouth every twelve (12) hours.      ??? amLODIPine (NORVASC) 10 MG tablet Take 10 mg by mouth daily.     ??? aspirin (ECOTRIN) 81 MG tablet Take 1 tablet (81 mg total) by mouth daily. 30 tablet 11   ??? cloNIDine HCL (CATAPRES) 0.2 MG tablet Take 1 tablet (0.2 mg total) by mouth Three (3) times a day. 90 tablet 11   ??? docusate sodium (COLACE) 100 MG capsule Take 1 capsule (100 mg total) by mouth two (2) times a day as needed for constipation. 60 capsule 0   ??? escitalopram oxalate (LEXAPRO) 10 MG tablet Take 15 mg by mouth.     ??? gabapentin (NEURONTIN) 100 MG capsule Take 3 capsules (300 mg total) by mouth Three (3) times a day. 270 capsule 0   ??? hydrALAZINE (APRESOLINE) 100 MG tablet Take 1 tablet (100 mg total) by mouth Three (3) times a day. 90 tablet 11   ??? magnesium oxide-Mg AA chelate (MAGNESIUM, AMINO ACID CHELATE,) 133 mg Tab Take 1 tablet by mouth Two (2) times a day. 60 tablet 11   ??? mycophenolate (MYFORTIC) 180 MG EC tablet Take 3 tablets (540 mg total) by mouth Two (2) times a day. 180 tablet 11   ??? omeprazole (PRILOSEC) 20 MG capsule Take 20 mg by mouth two (2) times a day.      ??? polyethylene glycol (GLYCOLAX) 17 gram/dose powder Dissolve 1 capful (17g) in 8 oz of juice or water daily as needed 510 g 0 ??? predniSONE (DELTASONE) 10 MG tablet Take 10 mg by mouth daily.   3   ??? sulfamethoxazole-trimethoprim (BACTRIM) 400-80 mg per tablet Take 1 tablet (80 mg of trimethoprim total) by mouth 3 (three) times a week. 12 tablet 5   ??? tacrolimus (ENVARSUS XR) 1 mg Tb24 extended release tablet Take 2 tablets (2 mg) by mouth daily with 1 (4 mg) tablets for total dose 6mg  daily. 60 tablet 11   ??? tacrolimus (ENVARSUS XR) 4 mg Tb24 extended release tablet Take 1 tablet (4 mg) by mouth daily with 2 (1 mg) tablets for total dose 6mg  daily. 30 tablet 11   ??? tamsulosin (FLOMAX) 0.4 mg capsule Take 1 capsule (0.4 mg total) by mouth daily. 30 capsule 0   ??? traMADoL (ULTRAM) 50 mg tablet Take 1 tablet (50 mg total) by mouth every six (6) hours as needed for pain. 28 tablet 0   ??? valGANciclovir (VALCYTE) 450 mg tablet Take 1 tablet (450 mg total) by mouth daily. 30 tablet 2     No current facility-administered medications for this visit.         Changes to medications: Renner reports no changes at this time.    Allergies   Allergen Reactions   ??? Acetaminophen  Nausea And Vomiting       Changes to allergies: No    SPECIALTY MEDICATION ADHERENCE     Envarsus Xr 1 mg: 13 days of medicine on hand   Envarsus Xr 4 mg: 13 days of medicine on hand   Myfortic 180 mg: 3 days of medicine on hand   Valganciclovir 450 mg: 3 days of medicine on hand       Medication Adherence    Patient reported X missed doses in the last month: 0  Specialty Medication: Envarsus Xr 1mg   Patient is on additional specialty medications: Yes  Additional Specialty Medications: Envarsus Xr 4mg   Patient Reported Additional Medication X Missed Doses in the Last Month: 0  Patient is on more than two specialty medications: Yes  Specialty Medication: Myfortic 180mg   Patient Reported Additional Medication X Missed Doses in the Last Month: 0  Specialty Medication: Valganciclovir 450mg   Patient Reported Additional Medication X Missed Doses in the Last Month: 0 Specialty medication(s) dose(s) confirmed: Regimen is correct and unchanged.     Are there any concerns with adherence? No    Adherence counseling provided? Not needed    CLINICAL MANAGEMENT AND INTERVENTION      Clinical Benefit Assessment:    Do you feel the medicine is effective or helping your condition? Yes    Clinical Benefit counseling provided? Not needed    Adverse Effects Assessment:    Are you experiencing any side effects? No    Are you experiencing difficulty administering your medicine? No    Quality of Life Assessment:    How many days over the past month did your kidney transplant  keep you from your normal activities? For example, brushing your teeth or getting up in the morning. Patient states about 2 weeks.    Have you discussed this with your provider? Not needed    Therapy Appropriateness:    Is therapy appropriate? Yes, therapy is appropriate and should be continued    DISEASE/MEDICATION-SPECIFIC INFORMATION      N/A    PATIENT SPECIFIC NEEDS     ? Does the patient have any physical, cognitive, or cultural barriers? No    ? Is the patient high risk? Yes, patient is taking a REMS drug. Medication is dispensed in compliance with REMS program.     ? Does the patient require a Care Management Plan? No     ? Does the patient require physician intervention or other additional services (i.e. nutrition, smoking cessation, social work)? No      SHIPPING     Specialty Medication(s) to be Shipped:   Transplant: Envarsus 1mg , Envarsus 4mg ,  mycophenolic acid 180mg  and valgancyclovir 450mg     Other medication(s) to be shipped: MG, SMZ-TMP, Hydralazine,ASA, Clonidine.     Changes to insurance: No    Delivery Scheduled: Yes, Expected medication delivery date: 05/27/2019.     Medication will be delivered via UPS to the confirmed prescription address in [facility]. The patient will receive a drug information handout for each medication shipped and additional FDA Medication Guides as required.  Verified that patient has previously received a Conservation officer, historic buildings.    All of the patient's questions and concerns have been addressed.    Tera Helper   [facility] Specialty Pharmacist

## 2019-05-25 NOTE — Unmapped (Signed)
Transplant Nephrology Clinic Visit      History of Present Illness    33 y.o. male here for follow up after kidney transplantation.  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  Patient has been on dialysis since 12/16/2017.  Patient's history includes HTN and anemia of CKD    Patient was admitted for kidney transplant on 04/28/2019.  In the PACU a peri-incisional hematoma was found.  US showed minor increase in resistive indices in the kidney and a perinephric hematoma.  [place] on 04/29/19 showed improved renal perfusion and decreased resistive indices with persistent hematoma.  During stay patient was persistently hypertensive.  Patient discharged on 05/02/2019.       Transplant History:    Organ Received: Left LDKT via NKR  Native Kidney Disease: ANCA Vasculitis; cPRA: negative  Date of Transplant: 04/28/19  Post-Transplant Course: Perinephric Hematoma  Prior Transplants: None  Induction: [place]  Date of Ureteral Stent Removal: 06/11/2019  CMV/EBV Status: CMV D-/R+  EBV D+/R+  Rejection Episodes: None  Donor Specific Antibodies: None  Results of Renal Imaging (pre and post):   Pre-Txp 03/27/2019  Normal size with increased echogenicity. No solid masses or calculi. No hydronephrosis.    Post-Txp 05/18/2019  NATIVE KIDNEYS: The native kidneys are small and echogenic. No solid masses or calculi. No hydronephrosis.  ??  TRANSPLANTED KIDNEY: The renal transplant was located in the right lower quadrant. Normal size and echogenicity.  No solid masses or calculi. 5.1 x 2.9 x 1.6 cm septated fluid collection superficial to the right lower quadrant kidney transplant, previously measured 2.4 x 2.1 x 1.5 cm. Double-J ureteral stent with proximal tip in the renal pelvis and distal tip within the bladder. No hydronephrosis.  NATIVE KIDNEYS: The native kidneys are small and echogenic. No solid masses or calculi. No hydronephrosis. TRANSPLANTED KIDNEY: The renal transplant was located in the right lower quadrant. Normal size and echogenicity.  No solid masses or calculi. 5.1 x 2.9 x 1.6 cm septated fluid collection superficial to the right lower quadrant kidney transplant, previously measured 2.4 x 2.1 x 1.5 cm. Double-J ureteral stent with proximal tip in the renal pelvis and distal tip within the bladder. No hydronephrosis.      Current Immunosuppression Regimen:   Envarsus 6 mg daily  Myfortic 540mg  BID      Subjective/Interval:   Since discharge, patient went to [facility] ED on 05/03/2019 due to acute pain in the RLQ, dysuria, and difficulty voiding. RUS showed good perfusion in the transplant kidney with interval decrease in resistive indices in the transplant arteries.  Also notes interval decrease in complex perinephric fluid collection adjacent to the upper pole.  Foley placed in ED due to large post-void residual.  He was discharged from ED with Foley in place.  Blood cultures taken in ED showed no growth.     Patient called on 05/05/2019 due to severe pain from his groin to his RLQ.  He took Oxycodone and Tylenol.  No other interventions noted. Oxycodone refill sent on 05/06/2019 by Leslye Peer, PA.     Patient seen by Transplant Surgery on 05/11/2019.  His foley was removed, his Gabapentin was increased and his Oxycodone was refilled. Patient called on 05/15/19 due to pain and received a refill of Oxycodone and his gabapentin was also increased to 300mg  TID.  Patient called on 05/19/2019 regarding pain and was prescribed Tramadol for 5 days. Today patient presents to clinic feeling achy in his muscles and joints, having chills,  sweats, and joint pain since Sunday.  He states he hasn't had a fever.  He is having some nausea and has been lightheaded and dizzy at times.  The pain in his right groin is unchanged and continues to bother him.  Patient states it does not radiate or involve his testicle. He also states he is having shortness of breath and some chest pain. He feels unable to take a deep breath at times.      He denies vomiting, diarrhea, edema, tremors, dysuria, hematuria, or difficulty urinating.         Past Medical History  1. ANCA Vasculitis  2. HTN  3. Anemia of CKD    Review of Systems    Otherwise as per HPI, all other systems reviewed and are negative.    Medications  No current facility-administered medications for this visit.      Current Outpatient Medications   Medication Sig Dispense Refill   ??? amLODIPine (NORVASC) 10 MG tablet Take 10 mg by mouth daily.     ??? aspirin (ECOTRIN) 81 MG tablet Take 1 tablet (81 mg total) by mouth daily. 30 tablet 11   ??? cloNIDine HCL (CATAPRES) 0.2 MG tablet Take 1 tablet (0.2 mg total) by mouth Three (3) times a day. 90 tablet 11   ??? escitalopram oxalate (LEXAPRO) 10 MG tablet Take 15 mg by mouth.     ??? gabapentin (NEURONTIN) 100 MG capsule Take 3 capsules (300 mg total) by mouth Three (3) times a day. 270 capsule 0   ??? hydrALAZINE (APRESOLINE) 100 MG tablet Take 1 tablet (100 mg total) by mouth Three (3) times a day. 90 tablet 11   ??? magnesium oxide-Mg AA chelate (MAGNESIUM, AMINO ACID CHELATE,) 133 mg Tab Take 1 tablet by mouth Two (2) times a day. 60 tablet 11   ??? mycophenolate (MYFORTIC) 180 MG EC tablet Take 3 tablets (540 mg total) by mouth Two (2) times a day. 180 tablet 11   ??? omeprazole (PRILOSEC) 20 MG capsule Take 20 mg by mouth two (2) times a day.      ??? predniSONE (DELTASONE) 10 MG tablet Take 10 mg by mouth daily.   3 ??? sulfamethoxazole-trimethoprim (BACTRIM) 400-80 mg per tablet Take 1 tablet (80 mg of trimethoprim total) by mouth 3 (three) times a week. 12 tablet 5   ??? tacrolimus (ENVARSUS XR) 1 mg Tb24 extended release tablet Take 1 tablet (1 mg) by mouth daily along with two 4 mg tablets for total dose of 9 mg daily 30 tablet 11   ??? tacrolimus (ENVARSUS XR) 4 mg Tb24 extended release tablet Take 2 tablets (8 mg) tablets once daily in addition to one 1 mg tablet for total daily dose of 9 mg daily. 60 tablet 11   ??? tamsulosin (FLOMAX) 0.4 mg capsule Take 1 capsule (0.4 mg total) by mouth daily. 30 capsule 0   ??? valGANciclovir (VALCYTE) 450 mg tablet Take 1 tablet (450 mg total) by mouth daily. 30 tablet 2   ??? acetaminophen (TYLENOL EXTRA STRENGTH) 500 MG tablet Take 2 tablets (1,000 mg total) by mouth every six (6) hours as needed for pain or fever (> 38C). 100 tablet 0   ??? ALPRAZolam (XANAX) 0.25 MG tablet Take 0.5 mg by mouth every twelve (12) hours.      ??? docusate sodium (COLACE) 100 MG capsule Take 1 capsule (100 mg total) by mouth two (2) times a day as needed for constipation. 60 capsule 0   ???  polyethylene glycol (GLYCOLAX) 17 gram/dose powder Dissolve 1 capful (17g) in 8 oz of juice or water daily as needed 510 g 0   ??? traMADoL (ULTRAM) 50 mg tablet Take 1 tablet (50 mg total) by mouth every six (6) hours as needed for pain. 28 tablet 0           Physical Exam  BP 135/81  - Pulse 77  - Temp 36.1 ??C (97 ??F) (Tympanic)  - Ht 177.8 cm (5' 10)  - Wt 65.8 kg (145 lb)  - SpO2 99%  - BMI 20.81 kg/m??   General: in minimal distress, skin warm to touch  HEENT: wearing mask, PERRL  Neck: neck supple, no cervical lymphadenopathy appreciated  CV: normal rate, normal rhythm, no murmur, no gallops, no rubs appreciated  Lungs: clear to auscultation bilaterally  Abdomen: soft, non tender over incision.  Staples present c/d/i without edema.  Pain with minimal palpation over right groin. Extremities:  no edema, right heel with crack in skin from being dry; no erythema, edema, or discharge  Musculoskeletal: no visible deformity, normal range of motion.  Pulses: intact distally throughout  Neurologic: awake, alert, and oriented x3      Laboratory Data and Imaging reviewed in EPIC      Assessment:  33 y.o. male status post living unrelated donor kidney transplant on 04/28/2019 as part of NKR, for ESRD secondary to ANCA Vasculitis who presents for routine follow up and post-transplant care.         Recommendations/Plan:     Sent to Emergency Department.  EKG in Clinic: NSR    Allograft Function: Renal function stable at 1.7 with baseline of approximately 1.7-1.9 since transplant. DSA pending today.      Immunosuppression Management [High Risk Medical Decision Making For Drug Therapy Requiring Intensive Monitoring For Toxicity]: Tacrolimus trough level pending today. Envarsus 6 mg.  Targeting tacrolimus trough levels of approximately 8-10 ng/mL.   Continue mycophenolate 540 mg BID.     Blood Pressure Management: BP 135/81 at this visit. Continue current regimen as BP is stable at this time.      Lipid Management: Last lipid panel on 03/23/2019.  Patient is currently not on a statin.      Electrolytes: Electrolyte and metabolic parameters in acceptable range.  Will continue to monitor     Infectious Prophylaxis and Monitoring: CMV VL pending.  No decoy cells present 05/11/2019  The patient continues on Valcyte and Bactrim prophylaxis.     Hx Cytoxan Exposure:  Needs repeat cystogram 2024    Pain at RLQ:  Currently on Tramadol and Gabapentin. Continues to have a lot of pain.     Health Maintenance:   Colonoscopy after age 10    Immunizations:   Flu Shot: 03/03/2019  Prevnar 13: 12/30/17  Pneumovax: 12/16/17  No immunizations till 1 year post transplant.    Counseling:  I counseled the patient on: The need to avoid sun exposure and the use of sunblock while outdoors given the relatively higher risk of skin malignancy in an immunosuppressed state.  The need for adherence to immunosuppression medication.  Patient verbalized understanding.     Follow-Up:  Return to clinic in: will decide after ED visit  Labs: 3 times a week.  Patient will continue to follow-up with his primary care provider for non-transplant related issues and medication refills. We have ordered transplant specific labs per the center's guidelines to monitor and assess for toxicities from immunosuppressant drug therapy  Virgilia Quigg L. Marina Goodell, MSN, APRN, AGNP-C - Kidney Transplant Nurse Practitioner  [facility]

## 2019-05-26 ENCOUNTER — Encounter
Admit: 2019-05-26 | Discharge: 2019-05-26 | Disposition: A | Discharge status: Home / Self Care | Payer: PRIVATE HEALTH INSURANCE | Attending: Student in an Organized Health Care Education/Training Program

## 2019-05-26 ENCOUNTER — Encounter: Admit: 2019-05-26 | Discharge: 2019-05-27 | Payer: PRIVATE HEALTH INSURANCE

## 2019-05-26 ENCOUNTER — Ambulatory Visit: Admit: 2019-05-26 | Discharge: 2019-05-27 | Payer: PRIVATE HEALTH INSURANCE

## 2019-05-26 DIAGNOSIS — Z940 Kidney transplant status: Principal | ICD-10-CM

## 2019-05-26 DIAGNOSIS — Z79899 Other long term (current) drug therapy: Principal | ICD-10-CM

## 2019-05-26 DIAGNOSIS — R1030 Lower abdominal pain, unspecified: Principal | ICD-10-CM

## 2019-05-26 LAB — CBC W/ DIFFERENTIAL
BASOPHILS ABSOLUTE COUNT: 0 10*9/L
BASOPHILS ABSOLUTE COUNT: 0 10*9/L
BASOPHILS RELATIVE PERCENT: 0 %
BASOPHILS RELATIVE PERCENT: 0 %
EOSINOPHILS ABSOLUTE COUNT: 0 10*9/L
EOSINOPHILS ABSOLUTE COUNT: 0 10*9/L
EOSINOPHILS RELATIVE PERCENT: 0 %
EOSINOPHILS RELATIVE PERCENT: 0 %
EOSINOPHILS RELATIVE PERCENT: 1 %
HEMATOCRIT: 28.9 % — ABNORMAL LOW
HEMATOCRIT: 27 % — ABNORMAL LOW
HEMATOCRIT: 27.3 % — ABNORMAL LOW
HEMOGLOBIN: 9.5 g/dL — ABNORMAL LOW
HEMOGLOBIN: 8.8 g/dL — ABNORMAL LOW
HEMOGLOBIN: 9.2 g/dL — ABNORMAL LOW
LYMPHOCYTES ABSOLUTE COUNT: 0.1 10*9/L — ABNORMAL LOW
LYMPHOCYTES ABSOLUTE COUNT: 0.1 10*9/L — ABNORMAL LOW
LYMPHOCYTES RELATIVE PERCENT: 1 %
LYMPHOCYTES RELATIVE PERCENT: 1 %
LYMPHOCYTES RELATIVE PERCENT: 1 %
MEAN CORPUSCULAR HEMOGLOBIN CONC: 32.9 g/dL
MEAN CORPUSCULAR HEMOGLOBIN CONC: 32.6 g/dL
MEAN CORPUSCULAR HEMOGLOBIN CONC: 33.7 g/dL
MEAN CORPUSCULAR HEMOGLOBIN: 31 pg
MEAN CORPUSCULAR HEMOGLOBIN: 30.6 pg
MEAN CORPUSCULAR VOLUME: 94.4 fL
MEAN CORPUSCULAR VOLUME: 95.1 fL
MEAN CORPUSCULAR VOLUME: 90.7 fL
MONOCYTES ABSOLUTE COUNT: 0.2 10*9/L
MONOCYTES ABSOLUTE COUNT: 0.2 10*9/L
MONOCYTES RELATIVE PERCENT: 4 %
MONOCYTES RELATIVE PERCENT: 4 %
NEUTROPHILS ABSOLUTE COUNT: 4 10*9/L
NEUTROPHILS ABSOLUTE COUNT: 0 10*9/L
NEUTROPHILS RELATIVE PERCENT: 94 %
NEUTROPHILS RELATIVE PERCENT: 94 %
NEUTROPHILS RELATIVE PERCENT: 92 %
PLATELET COUNT: 191 10*9/L
PLATELET COUNT: 186 10*9/L
PLATELET COUNT: 176 10*9/L
RED BLOOD CELL COUNT: 3.06 10*12/L — ABNORMAL LOW
RED BLOOD CELL COUNT: 2.84 10*12/L — ABNORMAL LOW
RED BLOOD CELL COUNT: 3.01 10*12/L — ABNORMAL LOW
RED CELL DISTRIBUTION WIDTH: 15.8 % — ABNORMAL HIGH
RED CELL DISTRIBUTION WIDTH: 15.7 % — ABNORMAL HIGH
RED CELL DISTRIBUTION WIDTH: 15.6 % — ABNORMAL HIGH
WBC ADJUSTED: 4.3 10*9/L
WBC ADJUSTED: 5 10*9/L
WHITE BLOOD CELL COUNT: 3.8 10*9/L — ABNORMAL LOW

## 2019-05-26 LAB — URINALYSIS WITH CULTURE REFLEX
BILIRUBIN UA: NEGATIVE
BLOOD UA: NEGATIVE
KETONES UA: NEGATIVE
LEUKOCYTE ESTERASE UA: NEGATIVE
NITRITE UA: NEGATIVE
PH UA: 6 (ref 5.0–9.0)
PROTEIN UA: NEGATIVE
RBC UA: 3 /HPF (ref <=3)
SPECIFIC GRAVITY UA: 1.016 (ref 1.003–1.030)
SQUAMOUS EPITHELIAL: <1 /HPF (ref 0–5)
UROBILINOGEN UA: 0.2
WBC UA: 5 /HPF — ABNORMAL HIGH (ref <=2)

## 2019-05-26 LAB — CBC W/ AUTO DIFF
BASOPHILS ABSOLUTE COUNT: 0 10*9/L (ref 0.0–0.1)
BASOPHILS RELATIVE PERCENT: 0.3 %
BASOPHILS RELATIVE PERCENT: 0.4 %
EOSINOPHILS ABSOLUTE COUNT: 0 10*9/L (ref 0.0–0.4)
EOSINOPHILS ABSOLUTE COUNT: 0 10*9/L (ref 0.0–0.4)
EOSINOPHILS RELATIVE PERCENT: 1 %
HEMATOCRIT: 33.6 % — ABNORMAL LOW (ref 41.0–53.0)
HEMOGLOBIN: 10.7 g/dL — ABNORMAL LOW (ref 13.5–17.5)
HEMOGLOBIN: 9.9 g/dL — ABNORMAL LOW (ref 13.5–17.5)
LARGE UNSTAINED CELLS: 0 % (ref 0–4)
LARGE UNSTAINED CELLS: 0 % (ref 0–4)
LYMPHOCYTES ABSOLUTE COUNT: 0.1 10*9/L — ABNORMAL LOW (ref 1.5–5.0)
LYMPHOCYTES ABSOLUTE COUNT: 0.1 10*9/L — ABNORMAL LOW (ref 1.5–5.0)
LYMPHOCYTES RELATIVE PERCENT: 2.2 %
LYMPHOCYTES RELATIVE PERCENT: 1.8 %
MEAN CORPUSCULAR HEMOGLOBIN CONC: 31.9 g/dL (ref 31.0–37.0)
MEAN CORPUSCULAR HEMOGLOBIN CONC: 33.3 g/dL (ref 31.0–37.0)
MEAN CORPUSCULAR HEMOGLOBIN: 30.4 pg (ref 26.0–34.0)
MEAN CORPUSCULAR HEMOGLOBIN: 31.1 pg (ref 26.0–34.0)
MEAN CORPUSCULAR VOLUME: 95.3 fL (ref 80.0–100.0)
MEAN CORPUSCULAR VOLUME: 93.4 fL (ref 80.0–100.0)
MEAN PLATELET VOLUME: 7.6 fL (ref 7.0–10.0)
MEAN PLATELET VOLUME: 9.1 fL (ref 7.0–10.0)
MONOCYTES ABSOLUTE COUNT: 0.1 10*9/L — ABNORMAL LOW (ref 0.2–0.8)
MONOCYTES ABSOLUTE COUNT: 0.2 10*9/L (ref 0.2–0.8)
MONOCYTES RELATIVE PERCENT: 3.4 %
MONOCYTES RELATIVE PERCENT: 3.9 %
NEUTROPHILS ABSOLUTE COUNT: 3.6 10*9/L (ref 2.0–7.5)
NEUTROPHILS ABSOLUTE COUNT: 3.5 10*9/L (ref 2.0–7.5)
NEUTROPHILS RELATIVE PERCENT: 93.2 %
PLATELET COUNT: 265 10*9/L (ref 150–440)
PLATELET COUNT: 198 10*9/L (ref 150–440)
RED BLOOD CELL COUNT: 3.53 10*12/L — ABNORMAL LOW (ref 4.50–5.90)
RED CELL DISTRIBUTION WIDTH: 17.4 % — ABNORMAL HIGH (ref 12.0–15.0)
RED CELL DISTRIBUTION WIDTH: 17.2 % — ABNORMAL HIGH (ref 12.0–15.0)
WBC ADJUSTED: 3.9 10*9/L — ABNORMAL LOW (ref 4.5–11.0)
WBC ADJUSTED: 3.8 10*9/L — ABNORMAL LOW (ref 4.5–11.0)

## 2019-05-26 LAB — BASIC METABOLIC PANEL
BLOOD UREA NITROGEN: 25 mg/dL — ABNORMAL HIGH
BLOOD UREA NITROGEN: 21 mg/dL — ABNORMAL HIGH
CALCIUM: 9 mg/dL
CALCIUM: 9 mg/dL
CHLORIDE: 104 mmol/L
CHLORIDE: 103 mmol/L
CHLORIDE: 106 mmol/L
CO2: 26 mmol/L
CO2: 23 mmol/L
CREATININE: 1.49 mg/dL — ABNORMAL HIGH
CREATININE: 1.59 mg/dL — ABNORMAL HIGH
CREATININE: 1.79 mg/dL — ABNORMAL HIGH
EGFR CKD-EPI AA MALE: 56 mL/min/{1.73_m2} — ABNORMAL LOW
EGFR CKD-EPI NON-AA MALE: 60 mL/min/{1.73_m2} — ABNORMAL LOW
EGFR CKD-EPI NON-AA MALE: 55 mL/min/{1.73_m2} — ABNORMAL LOW
GLUCOSE RANDOM: 94 mg/dL
GLUCOSE RANDOM: 103 mg/dL — ABNORMAL HIGH
POTASSIUM: 3.6 mmol/L
POTASSIUM: 4.1 mmol/L
POTASSIUM: 3.7 mmol/L
SODIUM: 139 mmol/L
SODIUM: 137 mmol/L
SODIUM: 139 mmol/L

## 2019-05-26 LAB — COMPREHENSIVE METABOLIC PANEL
ALBUMIN: 4.2 g/dL (ref 3.5–5.0)
ALBUMIN: 3.7 g/dL (ref 3.5–5.0)
ALKALINE PHOSPHATASE: 77 U/L (ref 38–126)
ALKALINE PHOSPHATASE: 66 U/L (ref 38–126)
ALT (SGPT): 20 U/L (ref <50)
ALT (SGPT): 18 U/L (ref <50)
ANION GAP: 12 mmol/L (ref 7–15)
AST (SGOT): 16 U/L — ABNORMAL LOW (ref 19–55)
AST (SGOT): 15 U/L — ABNORMAL LOW (ref 19–55)
BILIRUBIN TOTAL: 0.5 mg/dL (ref 0.0–1.2)
BILIRUBIN TOTAL: 0.2 mg/dL (ref 0.0–1.2)
BLOOD UREA NITROGEN: 29 mg/dL — ABNORMAL HIGH (ref 7–21)
BLOOD UREA NITROGEN: 28 mg/dL — ABNORMAL HIGH (ref 7–21)
BUN / CREAT RATIO: 18
CALCIUM: 9.7 mg/dL (ref 8.5–10.2)
CALCIUM: 9.2 mg/dL (ref 8.5–10.2)
CHLORIDE: 106 mmol/L (ref 98–107)
CHLORIDE: 109 mmol/L — ABNORMAL HIGH (ref 98–107)
CO2: 21 mmol/L — ABNORMAL LOW (ref 22.0–30.0)
CREATININE: 1.7 mg/dL — ABNORMAL HIGH (ref 0.70–1.30)
CREATININE: 1.6 mg/dL — ABNORMAL HIGH (ref 0.70–1.30)
EGFR CKD-EPI AA MALE: 59 mL/min/{1.73_m2} — ABNORMAL LOW (ref >=60)
EGFR CKD-EPI AA MALE: 64 mL/min/{1.73_m2} (ref >=60)
EGFR CKD-EPI NON-AA MALE: 51 mL/min/{1.73_m2} — ABNORMAL LOW (ref >=60)
EGFR CKD-EPI NON-AA MALE: 55 mL/min/{1.73_m2} — ABNORMAL LOW (ref >=60)
GLUCOSE RANDOM: 105 mg/dL (ref 70–179)
POTASSIUM: 4.3 mmol/L (ref 3.5–5.0)
POTASSIUM: 3.9 mmol/L (ref 3.5–5.0)
PROTEIN TOTAL: 7 g/dL (ref 6.5–8.3)
PROTEIN TOTAL: 6 g/dL — ABNORMAL LOW (ref 6.5–8.3)
SODIUM: 142 mmol/L (ref 135–145)
SODIUM: 139 mmol/L (ref 135–145)

## 2019-05-26 LAB — GLUCOSE RANDOM
Glucose:MCnc:Pt:Ser/Plas:Qn:: 105
Lab: 90

## 2019-05-26 LAB — MACROCYTES: Lab: 0

## 2019-05-26 LAB — MAGNESIUM
Lab: 1.6 — ABNORMAL LOW
Lab: 1.7
Lab: 1.9
MAGNESIUM: 1.7 mg/dL (ref 1.6–2.2)
Magnesium:MCnc:Pt:Ser/Plas:Qn:: 1.5 — ABNORMAL LOW
Magnesium:MCnc:Pt:Ser/Plas:Qn:: 1.7

## 2019-05-26 LAB — LACTATE BLOOD VENOUS: Lactate:SCnc:Pt:BldV:Qn:: 0.8

## 2019-05-26 LAB — TACROLIMUS, TROUGH
Lab: 4.5 — ABNORMAL LOW
Lab: 5.7

## 2019-05-26 LAB — CREATININE: Lab: 1.49 — ABNORMAL HIGH

## 2019-05-26 LAB — TROPONIN I: Troponin I.cardiac:MCnc:Pt:Ser/Plas:Qn:: <0.034

## 2019-05-26 LAB — PHOSPHORUS
Lab: 2.5
Lab: 2.6
Lab: 2.9
Phosphate:MCnc:Pt:Ser/Plas:Qn:: 2.8 — ABNORMAL LOW
Phosphate:MCnc:Pt:Ser/Plas:Qn:: 2.8 — ABNORMAL LOW

## 2019-05-26 LAB — BASOPHILS RELATIVE PERCENT: Lab: 0

## 2019-05-26 LAB — BILIRUBIN UA: Bilirubin:PrThr:Pt:Urine:Ord:Test strip: NEGATIVE

## 2019-05-26 LAB — RED BLOOD CELL COUNT: Lab: 3.53 — ABNORMAL LOW

## 2019-05-26 LAB — CALCIUM
Calcium:MCnc:Pt:Ser/Plas:Qn:: 9.2
Lab: 9

## 2019-05-26 LAB — VITAMIN D, TOTAL (25OH): Lab: 14.5 — ABNORMAL LOW

## 2019-05-26 LAB — LYMPHOCYTES ABSOLUTE COUNT: Lab: 0.1 — ABNORMAL LOW

## 2019-05-26 LAB — TACROLIMUS LEVEL, TROUGH: TACROLIMUS, TROUGH: 4.5 ng/mL — ABNORMAL LOW (ref 5.0–15.0)

## 2019-05-26 MED ORDER — TRAMADOL 50 MG TABLET
ORAL_TABLET | Freq: Three times a day (TID) | ORAL | 0 refills | 4 days | Status: CP | PRN
Start: 2019-05-26 — End: 2019-05-30

## 2019-05-26 MED ORDER — TACROLIMUS ER 4 MG TABLET,EXTENDED RELEASE 24 HR
ORAL_TABLET | 11 refills | 0 days | Status: CP
Start: 2019-05-26 — End: ?
  Filled 2019-05-26: qty 60, 30d supply, fill #0

## 2019-05-26 MED ORDER — TACROLIMUS ER 1 MG TABLET,EXTENDED RELEASE 24 HR
ORAL_TABLET | Freq: Every day | ORAL | 11 refills | 30 days | Status: CP
Start: 2019-05-26 — End: 2020-05-25

## 2019-05-26 MED ADMIN — oxyCODONE (ROXICODONE) immediate release tablet 5 mg: 5 mg | ORAL | @ 16:00:00 | Stop: 2019-05-26

## 2019-05-26 MED ADMIN — oxyCODONE (ROXICODONE) immediate release tablet 5 mg: 5 mg | ORAL | @ 19:00:00 | Stop: 2019-05-26

## 2019-05-26 MED FILL — ENVARSUS XR 1 MG TABLET,EXTENDED RELEASE: ORAL | 30 days supply | Qty: 30 | Fill #0

## 2019-05-26 MED FILL — ENVARSUS XR 4 MG TABLET,EXTENDED RELEASE: 30 days supply | Qty: 60 | Fill #0 | Status: AC

## 2019-05-26 MED FILL — ENVARSUS XR 1 MG TABLET,EXTENDED RELEASE: 30 days supply | Qty: 30 | Fill #0 | Status: AC

## 2019-05-26 NOTE — Unmapped (Signed)
Did not complete pharmacy visit as patient will go to the ED from clinic.    Will increase Envarsus dose to 9 mg daily due to subtherapeutic levels (available in [facility]). Target level 8-10.

## 2019-05-26 NOTE — Unmapped (Signed)
[facility] Emergency Department Provider Note      ED Course, Assessment and Plan     Initial Clinical Impression:    May 26, 2019 9:38 AM   Ryan Moon is a 33 y.o. male w/ hx of ESRD s/p renal transplant 11/24 on mycophenolate, tacrolimus, bactrim & valgancyclovir ppx, hypothyroidism, HTN presenting from transplant clinic for fever, chills, malaise, CP, SOB as described below. On exam, Vital signs stable.  Overall tired-appearing.  Normal cardiopulmonary exam.  Exam remarkable for TTP over transplanted kidney (RLQ), hx of appendectomy.     BP 111/81  - Pulse 75  - Temp 36.8 ??C (98.3 ??F) (Oral)  - Resp 16  - SpO2 99%     Differential diagnosis includes COVID, PNA, viral inf, intra-abdominal infection, rejection. Unlikely PE given stable vitals, non-pleuritic nature of CP, surgery ~4 wks ago.    Will obtain labs, BCx, UA, tacro level,  transplant renal US. Will treat patient with home oxycodone.  Will consult transplant surgery for further recommendations.    ED Course:    ED Course as of May 27 1712   Tue May 26, 2019   1039 Lactate, Venous: 0.8   1040 Cr stable.      1040 CBC w/ stable anemia.      1041 NSR, no EKG changes. Borderline LVH. No signs of ischemia.       1050 Troponin I: <0.034   1053 SARS-CoV-2 PCR: Negative   1100 Spoke to transplant surgery who would like to see results from renal ultrasound to decide if further imaging is needed.      1132 CXR neg for infection.      1308 Renal US  -Decreased size of perinephric fluid collection.  -Stable to slightly decreased resistive indices of renal transplant arteries, compared to prior. Mildly elevated velocities in the main renal artery at the anastomosis measuring up to 3.6 m/s, increased from prior.      1331 Paged transplant surgery for further recommendations.      1340 UA unremarkable. 1454 Spoke to transplant surgery who do not wish to have any further imaging.  They took out his staples.  Patient will follow up with transplant team tomorrow's phone call.  Gave return precautions.  Patient overall appears stable and well.           _____________________________________________________________________    The case was discussed with attending physician who is in agreement with the above assessment and plan    Dictation software was used while making this note. Please excuse any errors made with dictation software.    Additional Medical Decision Making     I have reviewed the vital signs and the nursing notes. Labs and radiology results that were available during my care of the patient were independently reviewed by me and considered in my medical decision making.     I independently visualized the EKG tracing if performed  I independently visualized the radiology images if performed  I reviewed the patient's prior medical records if available.  Additional history obtained from family if available    History     CHIEF COMPLAINT:   Chief Complaint   Patient presents with   ??? Chills       HPI: Ryan Moon is a 33 y.o. male w/ hx of ESRD s/p renal transplant 11/24, hypothyroidism, HTN presenting from transplant clinic for fever, chills, malaise, CP, SOB x 2 days.  Patient states he has felt myalgias, subjective fevers and  chills for 2 days.  He has also been experiencing centrally located, sharp, intermittent chest pain that lasts minutes at a time, is not worsened or alleviated by anything in particular.  Also endorsing episodes of shortness of breath that occur at random times, not related to exertion.  Endorsing some nausea, but no vomiting, not currently nauseous.  Denies family history of cardiac disease, hemoptysis, lower extremity swelling, abdominal pain, diarrhea, cough, sore throat.    PAST MEDICAL HISTORY/PAST SURGICAL HISTORY:   Past Medical History:   Diagnosis Date   ??? Anemia ??? HTN (hypertension)    ??? Renal vasculitis (CMS-HCC)    ??? Secondary hyperparathyroidism (CMS-HCC)        Past Surgical History:   Procedure Laterality Date   ??? APPENDECTOMY  2013   ??? PR TRANSPLANT,PREP CADAVER RENAL GRAFT Right 04/28/2019    Procedure: [facility] STD PREP CAD DONR RENAL ALLOGFT PRIOR TO TRNSPLNT, INCL DISSEC/REM PERINEPH FAT, DIAPH/RTPER ATTAC;  Surgeon: Doyce Loose, MD;  Location: MAIN OR [facility];  Service: Transplant   ??? PR TRANSPLANTATION OF KIDNEY Right 04/28/2019    Procedure: RENAL ALLOTRANSPLANTATION, IMPLANTATION OF GRAFT; WITHOUT RECIPIENT NEPHRECTOMY;  Surgeon: Doyce Loose, MD;  Location: MAIN OR [facility];  Service: Transplant       MEDICATIONS:   No current facility-administered medications for this encounter.     Current Outpatient Medications:   ???  acetaminophen (TYLENOL EXTRA STRENGTH) 500 MG tablet, Take 2 tablets (1,000 mg total) by mouth every six (6) hours as needed for pain or fever (> 38C)., Disp: 100 tablet, Rfl: 0  ???  ALPRAZolam (XANAX) 0.25 MG tablet, Take 0.5 mg by mouth every twelve (12) hours. , Disp: , Rfl:   ???  amLODIPine (NORVASC) 10 MG tablet, Take 10 mg by mouth daily., Disp: , Rfl:   ???  aspirin (ECOTRIN) 81 MG tablet, Take 1 tablet (81 mg total) by mouth daily., Disp: 30 tablet, Rfl: 11  ???  cloNIDine HCL (CATAPRES) 0.2 MG tablet, Take 1 tablet (0.2 mg total) by mouth Three (3) times a day., Disp: 90 tablet, Rfl: 11  ???  docusate sodium (COLACE) 100 MG capsule, Take 1 capsule (100 mg total) by mouth two (2) times a day as needed for constipation., Disp: 60 capsule, Rfl: 0  ???  escitalopram oxalate (LEXAPRO) 10 MG tablet, Take 15 mg by mouth., Disp: , Rfl:   ???  gabapentin (NEURONTIN) 100 MG capsule, Take 3 capsules (300 mg total) by mouth Three (3) times a day., Disp: 270 capsule, Rfl: 0  ???  hydrALAZINE (APRESOLINE) 100 MG tablet, Take 1 tablet (100 mg total) by mouth Three (3) times a day., Disp: 90 tablet, Rfl: 11 ???  magnesium oxide-Mg AA chelate (MAGNESIUM, AMINO ACID CHELATE,) 133 mg Tab, Take 1 tablet by mouth Two (2) times a day., Disp: 60 tablet, Rfl: 11  ???  mycophenolate (MYFORTIC) 180 MG EC tablet, Take 3 tablets (540 mg total) by mouth Two (2) times a day., Disp: 180 tablet, Rfl: 11  ???  omeprazole (PRILOSEC) 20 MG capsule, Take 20 mg by mouth two (2) times a day. , Disp: , Rfl:   ???  polyethylene glycol (GLYCOLAX) 17 gram/dose powder, Dissolve 1 capful (17g) in 8 oz of juice or water daily as needed, Disp: 510 g, Rfl: 0  ???  predniSONE (DELTASONE) 10 MG tablet, Take 10 mg by mouth daily. , Disp: , Rfl: 3  ???  sulfamethoxazole-trimethoprim (BACTRIM) 400-80 mg per tablet, Take 1 tablet (80  mg of trimethoprim total) by mouth 3 (three) times a week., Disp: 12 tablet, Rfl: 5  ???  tacrolimus (ENVARSUS XR) 1 mg Tb24 extended release tablet, Take 1 tablet (1 mg) by mouth daily along with two 4 mg tablets for total dose of 9 mg daily, Disp: 30 tablet, Rfl: 11  ???  tacrolimus (ENVARSUS XR) 4 mg Tb24 extended release tablet, Take 2 tablets (8 mg) tablets once daily in addition to one 1 mg tablet for total daily dose of 9 mg daily., Disp: 60 tablet, Rfl: 11  ???  tamsulosin (FLOMAX) 0.4 mg capsule, Take 1 capsule (0.4 mg total) by mouth daily., Disp: 30 capsule, Rfl: 0  ???  traMADoL (ULTRAM) 50 mg tablet, Take 1 tablet (50 mg total) by mouth every six (6) hours as needed for pain., Disp: 28 tablet, Rfl: 0  ???  valGANciclovir (VALCYTE) 450 mg tablet, Take 1 tablet (450 mg total) by mouth daily., Disp: 30 tablet, Rfl: 2    ALLERGIES:   Acetaminophen    SOCIAL HISTORY:   Social History     Tobacco Use   ??? Smoking status: Former Smoker     Types: Cigarettes     Quit date: 05/22/2018     Years since quitting: 1.0   ??? Smokeless tobacco: Former Neurosurgeon     Types: Chew   ??? Tobacco comment: Patient noted that he has cut down to 1-2 cigarettes per day and chews tobacco.    Substance Use Topics   ??? Alcohol use: Not Currently     Frequency: Never Comment: Patient had a DUI at age 81. Until 5/16 patient would have 1-2 beers per day.        FAMILY HISTORY:  No family history on file.       Review of Systems    A 10 point review of systems was performed and is negative other than positive elements noted in HPI   Constitutional: Negative for fever.  Eyes: Negative for visual changes.  ENT: Negative for sore throat.  Cardiovascular:  See HPI  Respiratory: See HPI  Gastrointestinal: Negative for abdominal pain, vomiting or diarrhea.  Genitourinary: Negative for dysuria.  Musculoskeletal: Negative for back pain.  Skin: Negative for rash.  Neurological: Negative for headaches, focal weakness or numbness.    Physical Exam     VITAL SIGNS:    BP 111/81  - Pulse 75  - Temp 36.8 ??C (98.3 ??F) (Oral)  - Resp 16  - SpO2 99%     Constitutional: Alert and oriented. Overall tired-appearing.   Eyes: Conjunctivae are normal.  ENT       Head: Normocephalic and atraumatic.       Nose: No congestion.       Mouth/Throat: Mucous membranes are moist.       Neck: No stridor.  Cardiovascular: Normal rate, regular rhythm.   Respiratory: Normal respiratory effort. Breath sounds are normal.  Gastrointestinal: Tenderness to palpation over renal transplant, healing scar without signs of infection.  Musculoskeletal: Normal range of motion in all extremities.   Neurologic: Normal speech and language. No gross focal neurologic deficits are appreciated.  Skin: Skin is warm, dry. No rash noted.  Psychiatric: Mood and affect are normal. Speech and behavior are normal.      Radiology     XR Chest Portable   Final Result      No acute airspace disease.      US Renal Transplant W Doppler    (Results Pending)  Ecg 12 Lead (adult)    Result Date: 05/26/2019 NORMAL SINUS RHYTHM MINIMAL VOLTAGE CRITERIA FOR LVH, MAY BE NORMAL VARIANT ( [employer] product ) BORDERLINE ECG WHEN COMPARED WITH ECG OF 30-Apr-2019 08:36, NO SIGNIFICANT CHANGE WAS FOUND Confirmed by Warnell Forester [phone]) on 05/26/2019 12:29:07 PM    Xr Chest Portable    Result Date: 05/26/2019  EXAM: XR CHEST PORTABLE DATE: 05/26/2019 ACCESSION: [id number] UN DICTATED: 05/26/2019 10:45 AM INTERPRETATION LOCATION: [facility] CLINICAL INDICATION: 33 years old Male with CHEST PAIN  COMPARISON: 04/16/2019 TECHNIQUE: Portable Chest Radiograph. FINDINGS: Lungs radiographically clear. No pleural effusion or pneumothorax Stable cardiomediastinal silhouette.     No acute airspace disease.          Pertinent labs & imaging results that were available during my care of the patient were reviewed by me and considered in my medical decision making (see chart for details).    Please note- This chart has been created using [employer]. Chart creation errors have been sought, but may not always be located and such creation errors, especially pronoun confusion, do NOT reflect on the standard of medical care.     Griffith Citron  Resident  05/28/19 1714

## 2019-05-26 NOTE — Unmapped (Signed)
Transplant Coordinator, Clinic Visit   Pt seen today by transplant nephrology for follow up, reviewed medications and symptoms. Pt in clinic today c/o chills, body aches, nausea, difficulty taking deep breath and chest pain. VS and EKG benign. PT SENT TO ED FOR EVALUATION.         05/26/19 0809   BP: 135/81   Pulse: 77   Temp: 36.1 ??C (97 ??F)   Weight: 65.8 kg (145 lb)   Height: 177.8 cm (5' 10)   PainSc: 10-Worst pain ever   PainLoc: Groin     Assessment  BP: 135/81 today  Headache: no  Hand tremors: no  Numbness/tingling: no  Fevers: no  Chills/sweats: chills  Shortness of breath: this morning, unable to take deep breath  Chest pain or pressure: pain this morning  Palpitations: no  Nausea/vomiting: occasional nausea  Diarrhea/constipation: no  UTI symptoms: no  Swelling: no  Pain: 10/10 pain in groin  Incision: C/D/I    Intake: 7- 20oz bottles of water per day  Output: unknown    Any new medications? no  Immunosuppressant last taken: 9am yesterday    I spent a total of 20 minutes with Daven L Oquin reviewing medications and symptoms.

## 2019-05-26 NOTE — Unmapped (Signed)
Pt brought down from transplant clinic for evaluation of fever chills, and malaise x2 days. Hx of kidney transplant 3 weeks ago.

## 2019-05-26 NOTE — Unmapped (Signed)
Transplant Surgery Consult Note    Requesting Attending Physician:  Ulla Gallo Migliaccio,*  Service Requesting Consult:  Emergency Medicine  Service Providing Consult: Transplant Surgery  Consulting Attending: Allyn Kenner, MD    Assessment:  Ryan Moon is a 33 y.o. 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 Matilde Haymaker. His post-operative course was complicated by perinephric hematoma managed non-operatively. He was discharged on 05/02/2019. He was doing well until this last Sunday when he developed generalized body aches and malaise. Yesterday he had episode of shortness of breath that is now resolved. Today after U/S he also had a severe occipital headache which improved with medication.    - Laboratory work-up showed improved creatinine compared to discharge labs, low phosphorus and magnesium. Hemoglobin and WBC stable. TAC levels this morning 5.7. UA with few bacteria, rare mucus, 5 WBC, otherwise unremarkable.  - Renal U/S showed decrease in known perinephric fluid collection, mildly increased velocity on main renal artery anastomosis site.  - CXR with no signs of infectious processes  - Abdominal exam is benign, no signs of wound infection.    PLAN:   - Patient can be discharged with instructions of alert signs and reasons to call our service or present to the ED.  - He should call his transplant coordinator tomorrow to update on his health status. We will follow results of blood and urine culture and will update him if any abnormalities.  - Patient has stent removal scheduled for 06/11/19.  - Will remove his staples today.    Case discussed with Dr. Norma Moon who is in agreement with the plan.    Ryan Riches, MD  General Surgery PGY-3 - [phone]      History of Present Illness:   Chief Complaint: Body ache, feeling sick Ryan Moon is a 32 y.o. male who is seen in consultation at the request of Berkley Harvey,* on the Emergency Medicine service.     Ryan Moon is 33 y.o. 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 Matilde Haymaker. His post-operative course was complicated by perinephric hematoma managed non-operatively. He was discharged on 05/02/2019. He re-presented to the ED the following day with abdominal pain and was diagnosed with urinary retention and had a foley placed. This was removed 5 days later. He was doing well until this last Sunday when he developed generalized body aches and malaise. Yesterday he had episode of shortness of breath that is now resolved. Today after U/S he also had a severe occipital headache which improved with medication.     Past Medical History:   Past Medical History:   Diagnosis Date   ??? Anemia    ??? HTN (hypertension)    ??? Renal vasculitis (CMS-HCC)    ??? Secondary hyperparathyroidism (CMS-HCC)        Past Surgical History:  Past Surgical History:   Procedure Laterality Date   ??? APPENDECTOMY  2013   ??? PR TRANSPLANT,PREP CADAVER RENAL GRAFT Right 04/28/2019    Procedure: [facility] STD PREP CAD DONR RENAL ALLOGFT PRIOR TO TRNSPLNT, INCL DISSEC/REM PERINEPH FAT, DIAPH/RTPER ATTAC;  Surgeon: Doyce Loose, MD;  Location: MAIN OR [facility];  Service: Transplant   ??? PR TRANSPLANTATION OF KIDNEY Right 04/28/2019    Procedure: RENAL ALLOTRANSPLANTATION, IMPLANTATION OF GRAFT; WITHOUT RECIPIENT NEPHRECTOMY;  Surgeon: Doyce Loose, MD;  Location: MAIN OR [facility];  Service: Transplant  Medications:  No current facility-administered medications on file prior to encounter.      Current Outpatient Medications on File Prior to Encounter   Medication Sig Dispense Refill ??? acetaminophen (TYLENOL EXTRA STRENGTH) 500 MG tablet Take 2 tablets (1,000 mg total) by mouth every six (6) hours as needed for pain or fever (> 38C). 100 tablet 0   ??? ALPRAZolam (XANAX) 0.25 MG tablet Take 0.5 mg by mouth every twelve (12) hours.      ??? amLODIPine (NORVASC) 10 MG tablet Take 10 mg by mouth daily.     ??? aspirin (ECOTRIN) 81 MG tablet Take 1 tablet (81 mg total) by mouth daily. 30 tablet 11   ??? cloNIDine HCL (CATAPRES) 0.2 MG tablet Take 1 tablet (0.2 mg total) by mouth Three (3) times a day. 90 tablet 11   ??? docusate sodium (COLACE) 100 MG capsule Take 1 capsule (100 mg total) by mouth two (2) times a day as needed for constipation. 60 capsule 0   ??? escitalopram oxalate (LEXAPRO) 10 MG tablet Take 15 mg by mouth.     ??? gabapentin (NEURONTIN) 100 MG capsule Take 3 capsules (300 mg total) by mouth Three (3) times a day. 270 capsule 0   ??? hydrALAZINE (APRESOLINE) 100 MG tablet Take 1 tablet (100 mg total) by mouth Three (3) times a day. 90 tablet 11   ??? magnesium oxide-Mg AA chelate (MAGNESIUM, AMINO ACID CHELATE,) 133 mg Tab Take 1 tablet by mouth Two (2) times a day. 60 tablet 11   ??? mycophenolate (MYFORTIC) 180 MG EC tablet Take 3 tablets (540 mg total) by mouth Two (2) times a day. 180 tablet 11   ??? omeprazole (PRILOSEC) 20 MG capsule Take 20 mg by mouth two (2) times a day.      ??? [EXPIRED] oxyCODONE (ROXICODONE) 5 MG immediate release tablet Take 1 tablet (5 mg total) by mouth every six (6) hours as needed for up to 5 days. 20 tablet 0   ??? polyethylene glycol (GLYCOLAX) 17 gram/dose powder Dissolve 1 capful (17g) in 8 oz of juice or water daily as needed 510 g 0   ??? predniSONE (DELTASONE) 10 MG tablet Take 10 mg by mouth daily.   3   ??? sulfamethoxazole-trimethoprim (BACTRIM) 400-80 mg per tablet Take 1 tablet (80 mg of trimethoprim total) by mouth 3 (three) times a week. 12 tablet 5 ??? tacrolimus (ENVARSUS XR) 1 mg Tb24 extended release tablet Take 1 tablet (1 mg) by mouth daily along with two 4 mg tablets for total dose of 9 mg daily 30 tablet 11   ??? tacrolimus (ENVARSUS XR) 4 mg Tb24 extended release tablet Take 2 tablets (8 mg) tablets once daily in addition to one 1 mg tablet for total daily dose of 9 mg daily. 60 tablet 11   ??? tamsulosin (FLOMAX) 0.4 mg capsule Take 1 capsule (0.4 mg total) by mouth daily. 30 capsule 0   ??? traMADoL (ULTRAM) 50 mg tablet Take 1 tablet (50 mg total) by mouth every six (6) hours as needed for pain. 28 tablet 0   ??? valGANciclovir (VALCYTE) 450 mg tablet Take 1 tablet (450 mg total) by mouth daily. 30 tablet 2       Allergies:  Allergies   Allergen Reactions   ??? Acetaminophen Nausea And Vomiting       Family History:  No family history on file.    Social History:   Social History     Tobacco Use   ???  Smoking status: Former Smoker     Types: Cigarettes     Quit date: 05/22/2018     Years since quitting: 1.0   ??? Smokeless tobacco: Former Neurosurgeon     Types: Chew   ??? Tobacco comment: Patient noted that he has cut down to 1-2 cigarettes per day and chews tobacco.    Substance Use Topics   ??? Alcohol use: Not Currently     Frequency: Never     Comment: Patient had a DUI at age 25. Until 5/16 patient would have 1-2 beers per day.    ??? 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 Patient also reports having smoked  marijuana daily until 01/2018       Review of Systems  10 systems were reviewed and are negative except as noted specifically in the HPI.    Objective  Vitals:   Temp:  [36.1 ??C-36.8 ??C] 36.8 ??C  Heart Rate:  [71-77] 71  SpO2 Pulse:  [71-99] 71  Resp:  [15-16] 15  BP: (111-135)/(81-84) 130/84  MAP (mmHg):  [98] 98  SpO2:  [97 %-99 %] 97 %  BMI (Calculated):  [20.8-20.81] 20.8    Intake/Output last 24 hours: No intake or output data in the 24 hours ending 05/26/19 1417    Physical Exam:   General: Awake and alert; reclining comfortably; no acute distress;  Cardiac: Regular rate and rhythm;   Respiratory: Lungs clear to auscultation bilaterally without wheezes, rales, or rhonchi; symmetric chest movement without increased work of breathing;  Abdominal: Soft, non-distended; no rebound or guarding; no masses, surgical incision with staples, intact, clean, dry, no erythema or discharge, mildly tender to palpation, which is present since surgery, no recent change. No CVA tenderness  Extremities: 2+ radial pulses; no lower extremity edema  Skin: Warm, dry; no petechia, cyanosis  Neuro: Grossly normal; GCS 15  Psych: Awake, alert; follows commands; mood congruent with affect    Pertinent Diagnostic Tests:  Recent Results (from the past 24 hour(s))   Phosphorus Level    Collection Time: 05/26/19  7:46 AM   Result Value Ref Range    Phosphorus 2.8 (L) 2.9 - 4.7 mg/dL   Magnesium Level    Collection Time: 05/26/19  7:46 AM   Result Value Ref Range    Magnesium 1.7 1.6 - 2.2 mg/dL   Comprehensive Metabolic Panel    Collection Time: 05/26/19  7:46 AM   Result Value Ref Range    Sodium 142 135 - 145 mmol/L    Potassium 4.3 3.5 - 5.0 mmol/L    Chloride 106 98 - 107 mmol/L    Anion Gap 12 7 - 15 mmol/L    CO2 24.0 22.0 - 30.0 mmol/L    BUN 29 (H) 7 - 21 mg/dL    Creatinine 9.81 (H) 0.70 - 1.30 mg/dL    BUN/Creatinine Ratio 17     EGFR CKD-EPI Non-African American, Male 51 (L) >=60 mL/min/1.59m2    EGFR CKD-EPI African American, Male 59 (L) >=60 mL/min/1.59m2    Glucose 105 70 - 179 mg/dL    Calcium 9.7 8.5 - 19.1 mg/dL    Albumin 4.2 3.5 - 5.0 g/dL    Total Protein 7.0 6.5 - 8.3 g/dL    Total Bilirubin 0.5 0.0 - 1.2 mg/dL    AST 16 (L) 19 - 55 U/L  ALT 20 <50 U/L    Alkaline Phosphatase 77 38 - 126 U/L   Tacrolimus Level, Trough    Collection Time: 05/26/19  7:46 AM   Result Value Ref Range Tacrolimus, Trough 5.7 5.0 - 15.0 ng/mL   Vitamin D 25 Hydroxy (25OH D2 + D3)    Collection Time: 05/26/19  7:46 AM   Result Value Ref Range    Vitamin D Total (25OH) 14.5 (L) 20.0 - 80.0 ng/mL   CBC w/ Differential    Collection Time: 05/26/19  7:46 AM   Result Value Ref Range    WBC 3.9 (L) 4.5 - 11.0 10*9/L    RBC 3.53 (L) 4.50 - 5.90 10*12/L    HGB 10.7 (L) 13.5 - 17.5 g/dL    HCT 29.5 (L) 62.1 - 53.0 %    MCV 95.3 80.0 - 100.0 fL    MCH 30.4 26.0 - 34.0 pg    MCHC 31.9 31.0 - 37.0 g/dL    RDW 30.8 (H) 65.7 - 15.0 %    MPV 7.6 7.0 - 10.0 fL    Platelet 265 150 - 440 10*9/L    Neutrophils % 93.2 %    Lymphocytes % 2.2 %    Monocytes % 3.4 %    Eosinophils % 0.5 %    Basophils % 0.3 %    Neutrophil Left Shift 1+ (A) Not Present    Absolute Neutrophils 3.6 2.0 - 7.5 10*9/L    Absolute Lymphocytes 0.1 (L) 1.5 - 5.0 10*9/L    Absolute Monocytes 0.1 (L) 0.2 - 0.8 10*9/L    Absolute Eosinophils 0.0 0.0 - 0.4 10*9/L    Absolute Basophils 0.0 0.0 - 0.1 10*9/L    Large Unstained Cells 0 0 - 4 %    Macrocytosis Slight (A) Not Present    Anisocytosis Slight (A) Not Present   Comprehensive Metabolic Panel    Collection Time: 05/26/19 10:03 AM   Result Value Ref Range    Sodium 139 135 - 145 mmol/L    Potassium 3.9 3.5 - 5.0 mmol/L    Chloride 109 (H) 98 - 107 mmol/L    Anion Gap 9 7 - 15 mmol/L    CO2 21.0 (L) 22.0 - 30.0 mmol/L    BUN 28 (H) 7 - 21 mg/dL    Creatinine 8.46 (H) 0.70 - 1.30 mg/dL    BUN/Creatinine Ratio 18     EGFR CKD-EPI Non-African American, Male 55 (L) >=60 mL/min/1.84m2    EGFR CKD-EPI African American, Male 17 >=60 mL/min/1.72m2    Glucose 94 70 - 179 mg/dL    Calcium 9.2 8.5 - 96.2 mg/dL    Albumin 3.7 3.5 - 5.0 g/dL    Total Protein 6.0 (L) 6.5 - 8.3 g/dL    Total Bilirubin 0.2 0.0 - 1.2 mg/dL    AST 15 (L) 19 - 55 U/L    ALT 18 <50 U/L    Alkaline Phosphatase 66 38 - 126 U/L   Magnesium Level    Collection Time: 05/26/19 10:03 AM   Result Value Ref Range    Magnesium 1.5 (L) 1.6 - 2.2 mg/dL Phosphorus Level    Collection Time: 05/26/19 10:03 AM   Result Value Ref Range    Phosphorus 2.8 (L) 2.9 - 4.7 mg/dL   Troponin I    Collection Time: 05/26/19 10:03 AM   Result Value Ref Range    Troponin I <0.034 <0.034 ng/mL   Rapid Influenza/RSV/COVID PCR    Collection Time:  05/26/19 10:03 AM    Specimen: Nasopharyngeal Swab   Result Value Ref Range    Influenza A Negative Negative    Influenza B Negative Negative    RSV Negative Negative    SARS-CoV-2 PCR Negative Negative   CBC w/ Differential    Collection Time: 05/26/19 10:03 AM   Result Value Ref Range    WBC 3.8 (L) 4.5 - 11.0 10*9/L    RBC 3.19 (L) 4.50 - 5.90 10*12/L    HGB 9.9 (L) 13.5 - 17.5 g/dL    HCT 29.5 (L) 62.1 - 53.0 %    MCV 93.4 80.0 - 100.0 fL    MCH 31.1 26.0 - 34.0 pg    MCHC 33.3 31.0 - 37.0 g/dL    RDW 30.8 (H) 65.7 - 15.0 %    MPV 9.1 7.0 - 10.0 fL    Platelet 198 150 - 440 10*9/L    Neutrophils % 92.6 %    Lymphocytes % 1.8 %    Monocytes % 3.9 %    Eosinophils % 1.0 %    Basophils % 0.4 %    Absolute Neutrophils 3.5 2.0 - 7.5 10*9/L    Absolute Lymphocytes 0.1 (L) 1.5 - 5.0 10*9/L    Absolute Monocytes 0.2 0.2 - 0.8 10*9/L    Absolute Eosinophils 0.0 0.0 - 0.4 10*9/L    Absolute Basophils 0.0 0.0 - 0.1 10*9/L    Large Unstained Cells 0 0 - 4 %    Macrocytosis Slight (A) Not Present    Anisocytosis Slight (A) Not Present   Tacrolimus Level, Trough    Collection Time: 05/26/19 10:03 AM   Result Value Ref Range    Tacrolimus, Trough 4.5 (L) 5.0 - 15.0 ng/mL   ECG 12 lead (Adult)    Collection Time: 05/26/19 10:08 AM   Result Value Ref Range    EKG Systolic BP  mmHg    EKG Diastolic BP  mmHg    EKG Ventricular Rate 69 BPM    EKG Atrial Rate 69 BPM    EKG P-R Interval 122 ms    EKG QRS Duration 100 ms    EKG Q-T Interval 388 ms    EKG QTC Calculation 415 ms    EKG Calculated P Axis 19 degrees    EKG Calculated R Axis 55 degrees    EKG Calculated T Axis 51 degrees    QTC Fredericia 406 ms   Lactate, Venous, Whole Blood Collection Time: 05/26/19 10:14 AM   Result Value Ref Range    Lactate, Venous 0.8 0.5 - 1.8 mmol/L   Urinalysis with Culture Reflex    Collection Time: 05/26/19 12:40 PM    Specimen: Clean Catch; Urine   Result Value Ref Range    Color, UA Light Yellow     Clarity, UA Clear     Specific Gravity, UA 1.016 1.003 - 1.030    pH, UA 6.0 5.0 - 9.0    Leukocyte Esterase, UA Negative Negative    Nitrite, UA Negative Negative    Protein, UA Negative Negative    Glucose, UA Negative Negative    Ketones, UA Negative Negative    Urobilinogen, UA 0.2 mg/dL 0.2 mg/dL, 1.0 mg/dL    Bilirubin, UA Negative Negative    Blood, UA Negative Negative    RBC, UA 3 <=3 /HPF    WBC, UA 5 (H) <=2 /HPF    Squam Epithel, UA <1 0 - 5 /HPF    Bacteria, UA Few (  A) None Seen /HPF    Mucus, UA Rare (A) None Seen /HPF       Imaging:  Ecg 12 Lead (adult)    Result Date: 05/26/2019  NORMAL SINUS RHYTHM MINIMAL VOLTAGE CRITERIA FOR LVH, MAY BE NORMAL VARIANT ( [employer] product ) BORDERLINE ECG WHEN COMPARED WITH ECG OF 30-Apr-2019 08:36, NO SIGNIFICANT CHANGE WAS FOUND Confirmed by Warnell Forester ([id number]) on 05/26/2019 12:29:07 PM    Xr Chest Portable    Result Date: 05/26/2019  EXAM: XR CHEST PORTABLE DATE: 05/26/2019 ACCESSION: [id number] UN DICTATED: 05/26/2019 10:45 AM INTERPRETATION LOCATION: [facility] CLINICAL INDICATION: 34 years old Male with CHEST PAIN  COMPARISON: 04/16/2019 TECHNIQUE: Portable Chest Radiograph. FINDINGS: Lungs radiographically clear. No pleural effusion or pneumothorax Stable cardiomediastinal silhouette.     No acute airspace disease.    US Renal Transplant W Doppler    Result Date: 05/26/2019 EXAM: US RENAL Glenna Durand DATE: 05/26/2019 12:07 PM ACCESSION: [id number] UN DICTATED: 05/26/2019 12:08 PM INTERPRETATION LOCATION: [facility] CLINICAL INDICATION: 33 years old Male with pain at transplant site COMPARISON: 05/18/2019 TECHNIQUE:  Ultrasound views of the renal transplant were obtained using gray scale and color and spectral Doppler imaging. Views of the urinary bladder were obtained using gray scale and limited color Doppler imaging. FINDINGS: TRANSPLANTED KIDNEY: The renal transplant was located in the right lower quadrant. Normal size and echogenicity.  No solid masses or calculi. There is a perinephric fluid collection which measures 3.2 x 1.6 x 0.7 cm, previously 5.1 x 2.9 x 1.6 cm. No hydronephrosis. Mild pelviectasis which decreases post void. Partially imaged stent is seen in the renal transplant collecting system. VESSELS: - Perfusion: Using power Doppler, normal perfusion was seen throughout the renal parenchyma. - Resistive indices in the renal transplant are stable to slightly decreased compared with prior examination. - Main renal artery/iliac artery: Patent. Mildly elevated velocities in the main renal artery at the anastomosis measuring 3.6 m/s. - Main renal vein/iliac vein: Patent BLADDER: Trace echogenic debris in the bladder. Partially imaged stent is visualized in the bladder. -Decreased size of perinephric fluid collection. -Stable to slightly decreased resistive indices of renal transplant arteries, compared to prior. Mildly elevated velocities in the main renal artery at the anastomosis measuring up to 3.6 m/s, increased from prior. Please see below for data measurements: Renal Transplant: length 10.89 cm; width 5.01 cm; height 5.62 cm Arcuate artery superior resistive index: 0.56 Arcuate artery mid resistive index: 0.62 Arcuate artery inferior resistive index: 0.59 Previous resistive indices range of arcuate arteries: 0.56 - 0.75 Segmental artery superior resistive index: 0.56 Segmental artery mid resistive index: 0.58 Segmental artery inferior resistive index: 0.55 Previous resistive indices range of segmental arteries: 0.57 - 0.70 Main renal artery hilum resistive index: 0.71 Main renal artery mid resistive index: 0.79 Main renal artery anastomosis resistive index: 0.64 Previous resistive indices range of main renal artery: 0.69 - 0.72 Main renal vein: patent Iliac artery: Patent Iliac vein: Patent Bladder volume prevoid: 203.30  mL Bladder volume postvoid: 8.10  mL

## 2019-05-26 NOTE — Unmapped (Signed)
Patient's visit documented in cancelled visit.

## 2019-05-26 NOTE — Unmapped (Signed)
Patient expressed feeling pain all over with chest pain, achy, tightness in chest and shortness of breath started a day ago.    EKG performed and printout results was given to Darolyn Rua, NP.

## 2019-05-26 NOTE — Unmapped (Signed)
Received notification from the ED physician and Transplant Coordinator that patient is asking about pain medication after he was discharged from the ED.  While in the ED, transplant surgery assessed patient and didn't find any surgical concerns.  He was Flu and Covid negative.  Blood Cultures pending.  Reviewed PDMP report (below) with Dr. Toni Arthurs.  We are in agreement that no more narcotics will be prescribed to patient at this time for his groin/incisional pain as there is no surgical reason for the pain.  We will get imaging of the groin to rule out a possible hernia. Tramadol 50mg  will be prescribed ONCE without further refills.  If pain consists without a medical or surgical finding, will refer patient to pain management clinic.  Patient to follow up in clinic in 2 weeks due to the holidays.       [place] = Cleone Slim, CPP, PharmD with Transplant  [place] = Geralyn Corwin, [place] with Transplant Surgery      Fill Date ID   Written Drug Qty Days Prescriber Rx # Pharmacy Refill   Daily Dose* Pymt Type PMP     05/19/2019  1   05/19/2019  Tramadol Hcl 50 MG Tablet  28.00  7 [place]   [id number]   [facility] ([id number])   0  20.00 MME  Comm Ins   [place]   05/16/2019  1   05/15/2019  Oxycodone Hcl 5 MG Tablet  20.00  5 [place]   [id number]   [facility] ([id number])   0  30.00 MME  Comm Ins   [place]   05/14/2019  1   05/14/2019  Alprazolam 0.5 MG Tablet  20.00  10 Ma Bab   [id number]   [facility] ([id number])   0  2.00 LME  Comm Ins   [place]   05/11/2019  1   05/11/2019  Oxycodone Hcl 5 MG Tablet  50.00  6 Je Von   [id number]   [facility] ([id number])   0  62.50 MME  Comm Ins   [place]   05/06/2019  1   05/06/2019  Oxycodone Hcl 5 MG Tablet  40.00  5 Je Von   [id number]   [facility] ([id number])   0  60.00 MME  Comm Ins   [place]   04/29/2019  2   04/29/2019  Oxycodone Hcl 5 MG Tablet  40.00  5 Je Von   [id number]   [place] ([id number])   0  60.00 MME  Other   [place]   04/23/2019  1   04/23/2019  Alprazolam 0.5 MG Tablet  20.00  10 Ma Bab   [id number]   [facility] ([id number])   0  2.00 LME  Comm Ins   

## 2019-05-27 LAB — CMV DNA, QUANTITATIVE, PCR: CMV VIRAL LD: NOT DETECTED

## 2019-05-27 LAB — CMV QUANT LOG10: Lab: 0

## 2019-05-27 MED FILL — VALGANCICLOVIR 450 MG TABLET: ORAL | 30 days supply | Qty: 30 | Fill #0

## 2019-05-27 MED FILL — SULFAMETHOXAZOLE 400 MG-TRIMETHOPRIM 80 MG TABLET: 28 days supply | Qty: 12 | Fill #0 | Status: AC

## 2019-05-27 MED FILL — SULFAMETHOXAZOLE 400 MG-TRIMETHOPRIM 80 MG TABLET: ORAL | 28 days supply | Qty: 12 | Fill #0

## 2019-05-27 MED FILL — ASPIRIN 81 MG TABLET,DELAYED RELEASE: 30 days supply | Qty: 30 | Fill #0 | Status: AC

## 2019-05-27 MED FILL — MYCOPHENOLATE SODIUM 180 MG TABLET,DELAYED RELEASE: 30 days supply | Qty: 180 | Fill #0 | Status: AC

## 2019-05-27 MED FILL — CLONIDINE HCL 0.2 MG TABLET: 30 days supply | Qty: 90 | Fill #0 | Status: AC

## 2019-05-27 MED FILL — VALGANCICLOVIR 450 MG TABLET: 30 days supply | Qty: 30 | Fill #0 | Status: AC

## 2019-05-27 MED FILL — MG-PLUS-PROTEIN 133 MG TABLET: ORAL | 30 days supply | Qty: 60 | Fill #0

## 2019-05-27 MED FILL — CLONIDINE HCL 0.2 MG TABLET: ORAL | 30 days supply | Qty: 90 | Fill #0

## 2019-05-27 MED FILL — HYDRALAZINE 100 MG TABLET: 30 days supply | Qty: 90 | Fill #0 | Status: AC

## 2019-05-27 MED FILL — MG-PLUS-PROTEIN 133 MG TABLET: 30 days supply | Qty: 60 | Fill #0 | Status: AC

## 2019-05-27 MED FILL — HYDRALAZINE 100 MG TABLET: ORAL | 30 days supply | Qty: 90 | Fill #0

## 2019-05-27 NOTE — Unmapped (Signed)
Blood Culture    Order: [id number] and Order: [id number]  Status:  Preliminary result ????    No Growth at 24 hours

## 2019-05-27 NOTE — Unmapped (Signed)
Urine Culture  Order: [id number] - Reflex for Order [id number]  Status:  Preliminary result ????    50,000 to 100,000 CFU/mL Enterococcus faecalisAbnormal        Specimen Source: Clean Catch    sulfamethoxazole-trimethoprim (BACTRIM) 400-80 mg per tablet Take 1 tablet (80 mg of trimethoprim total) by mouth 3 (three) times a week. 12 tablet 5

## 2019-05-27 NOTE — Unmapped (Signed)
Pt called, released from ED after being evaluated for chills, SOB, CP this morning. Pt states transplant surgery was supposed to prescribe pain meds before he left the ED. Reviewed with txp surgery and H. Marina Goodell NP, pt receiving Tramadol 50mg  TID x 4 days and will get a RUS of his groin to evaluate for hernia. Pt made aware, tramadol sent to local pharmacy.

## 2019-05-27 NOTE — Unmapped (Signed)
Scheduled US pelvis limited for today 05/27/2019 @ 2:15pm at imaging center. Pt made aware, he states he does not have a ride, but will try to find a ride. Provide call back number to reschedule if needed.

## 2019-05-27 NOTE — Unmapped (Signed)
Chart opened and reviewed for the purpose of ED outreach benefit. After thorough review, it does not appear patient would benefit from an ED transitions call at this time.       Margaretha Seeds, Paramedic  Transitions of Care Management  Population Health Services  [facility], [facility]  [phone]

## 2019-05-30 MED ORDER — DOXYCYCLINE HYCLATE 100 MG CAPSULE
ORAL_CAPSULE | Freq: Two times a day (BID) | ORAL | 0 refills | 10.00000 days | Status: CP
Start: 2019-05-30 — End: 2019-06-09

## 2019-05-30 NOTE — Unmapped (Signed)
ED Progress Note    05/30/2019  9:28 AM    Notified of her resource nurse of positive urine culture with renal transplant patient.  Patient presented to the ED with chills.  Culture positive for 50,000 to 100,000 cfu/ml Enterococcus faecalis.  Was treated with Bactrim which is resistant.  Will place on a 10-day course of doxycycline (sent to pharmacy on file) and have resource nurse ensure that he follows up with transplant to notify.

## 2019-05-30 NOTE — Unmapped (Signed)
Contains abnormal data Urine Culture  Order: [id number] - Reflex for Order [id number]  Status:  Final result ??    50,000 to 100,000 CFU/mL Enterococcus faecalisAbnormal        Specimen Source: Clean Catch    With Susceptibilities posted - tx with bactrim - resistant - will send to APP

## 2019-05-30 NOTE — Unmapped (Signed)
Notified of her resource nurse of positive urine culture with renal transplant patient.  Patient presented to the ED with chills.  Culture positive for 50,000 to 100,000 cfu/ml Enterococcus faecalis.  Was treated with Bactrim which is resistant.  Will place on a 10-day course of doxycycline (sent to pharmacy on file) and have resource nurse ensure that he follows up with transplant to notify.    Electronically signed by Chrissie Noa, FNP at 05/30/19 [phone]    LM for patient to return call regarding test results 05/30/2019 @ 0941

## 2019-05-30 NOTE — Unmapped (Signed)
Blood Culture ??  Order: [id number] and Order: [id number]  Status: ??Preliminary result ????  ??  No Growth at 4 days

## 2019-05-31 NOTE — Unmapped (Signed)
Spoke to pt at this time 05/31/2019 discussed ucx results and plan of care as well as follow up plan of care. Pt verbalized understanding of same and denies further questions. Pt has already picked up new medication from pharmacy and will call his provider and transplant team Monday.

## 2019-05-31 NOTE — Unmapped (Signed)
Blood Culture ??  Order: [id number] and Order: [id number]  Status: ??Final result ????  ??  No Growth at 5 days

## 2019-05-31 NOTE — Unmapped (Signed)
Left Message for pt regarding results 05/31/2019

## 2019-06-01 ENCOUNTER — Other Ambulatory Visit
Admission: RE | Admit: 2019-06-01 | Discharge: 2019-06-01 | Disposition: A | Discharge status: Home / Self Care | Payer: BLUE CROSS/BLUE SHIELD | Source: Ambulatory Visit | Attending: Nephrology | Admitting: Nephrology

## 2019-06-01 DIAGNOSIS — Z09 Encounter for follow-up examination after completed treatment for conditions other than malignant neoplasm: Secondary | ICD-10-CM | POA: Insufficient documentation

## 2019-06-01 DIAGNOSIS — B259 Cytomegaloviral disease, unspecified: Secondary | ICD-10-CM | POA: Insufficient documentation

## 2019-06-01 DIAGNOSIS — Z789 Other specified health status: Secondary | ICD-10-CM | POA: Diagnosis not present

## 2019-06-01 DIAGNOSIS — E1129 Type 2 diabetes mellitus with other diabetic kidney complication: Secondary | ICD-10-CM | POA: Insufficient documentation

## 2019-06-01 DIAGNOSIS — Z114 Encounter for screening for human immunodeficiency virus [HIV]: Secondary | ICD-10-CM | POA: Diagnosis not present

## 2019-06-01 DIAGNOSIS — E559 Vitamin D deficiency, unspecified: Secondary | ICD-10-CM | POA: Diagnosis not present

## 2019-06-01 DIAGNOSIS — D631 Anemia in chronic kidney disease: Secondary | ICD-10-CM | POA: Insufficient documentation

## 2019-06-01 DIAGNOSIS — Z9483 Pancreas transplant status: Secondary | ICD-10-CM | POA: Diagnosis not present

## 2019-06-01 DIAGNOSIS — Z79899 Other long term (current) drug therapy: Secondary | ICD-10-CM | POA: Insufficient documentation

## 2019-06-01 DIAGNOSIS — Z940 Kidney transplant status: Secondary | ICD-10-CM | POA: Diagnosis not present

## 2019-06-01 DIAGNOSIS — N390 Urinary tract infection, site not specified: Secondary | ICD-10-CM | POA: Insufficient documentation

## 2019-06-01 DIAGNOSIS — D899 Disorder involving the immune mechanism, unspecified: Secondary | ICD-10-CM | POA: Diagnosis not present

## 2019-06-01 LAB — CBC WITH DIFFERENTIAL/PLATELET
Abs Immature Granulocytes: 0.04 10*3/uL (ref 0.00–0.07)
Basophils Absolute: 0 10*3/uL (ref 0.0–0.1)
Basophils Relative: 1 %
Eosinophils Absolute: 0 10*3/uL (ref 0.0–0.5)
Eosinophils Relative: 0 %
HCT: 35.3 % — ABNORMAL LOW (ref 39.0–52.0)
Hemoglobin: 11.5 g/dL — ABNORMAL LOW (ref 13.0–17.0)
Immature Granulocytes: 1 %
Lymphocytes Relative: 2 %
Lymphs Abs: 0.1 10*3/uL — ABNORMAL LOW (ref 0.7–4.0)
MCH: 30.8 pg (ref 26.0–34.0)
MCHC: 32.6 g/dL (ref 30.0–36.0)
MCV: 94.6 fL (ref 80.0–100.0)
Monocytes Absolute: 0.4 10*3/uL (ref 0.1–1.0)
Monocytes Relative: 8 %
Neutro Abs: 4.5 10*3/uL (ref 1.7–7.7)
Neutrophils Relative %: 88 %
Platelets: 274 10*3/uL (ref 150–400)
RBC: 3.73 MIL/uL — ABNORMAL LOW (ref 4.22–5.81)
RDW: 15.7 % — ABNORMAL HIGH (ref 11.5–15.5)
WBC: 5.1 10*3/uL (ref 4.0–10.5)
nRBC: 0 % (ref 0.0–0.2)

## 2019-06-01 LAB — BASIC METABOLIC PANEL
Anion gap: 8 (ref 5–15)
BUN: 38 mg/dL — ABNORMAL HIGH (ref 6–20)
CO2: 23 mmol/L (ref 22–32)
Calcium: 9.6 mg/dL (ref 8.9–10.3)
Chloride: 109 mmol/L (ref 98–111)
Creatinine, Ser: 2.29 mg/dL — ABNORMAL HIGH (ref 0.61–1.24)
GFR calc Af Amer: 41 mL/min — ABNORMAL LOW (ref >60)
GFR calc non Af Amer: 36 mL/min — ABNORMAL LOW (ref >60)
Glucose, Bld: 103 mg/dL — ABNORMAL HIGH (ref 70–99)
Potassium: 4.3 mmol/L (ref 3.5–5.1)
Sodium: 140 mmol/L (ref 135–145)

## 2019-06-01 LAB — PHOSPHORUS: Phosphorus: 3.1 mg/dL (ref 2.5–4.6)

## 2019-06-01 LAB — MAGNESIUM: Magnesium: 1.7 mg/dL (ref 1.7–2.4)

## 2019-06-01 NOTE — Unmapped (Signed)
Pt called to make [facility] aware he received a call from ED physician following up with UCX results from 12/22 when he was seen in the ED. UCX came back positive for 50,000 to 100,000 CFU/mL Enterococcus faecalis. He was prescribed Doxycycline 100mg  BID x 10 days which he started Saturday 05/30/2019. He was also told to stop his Bactrim. Reviewed with L. Mincemoyer Pharm D, he is to continue the abx and restart Bactrim. Pt made aware.     Pt cancelled his US Pelvis that was scheduled for last week. Reminded pt to reschedule ASAP. Pt verbalized understanding.

## 2019-06-01 NOTE — Unmapped (Signed)
Called patient because he has not viewed his [facility] Chat or [facility] result notification.  Notified patient of the following via Voicemail :   This is Franne Grip with [facility].  There is information available via [facility] and/or your [facility].  If you do not have a [facility] account, you will receive a mailed letter.  Thank you!

## 2019-06-02 ENCOUNTER — Encounter: Admit: 2019-06-02 | Discharge: 2019-06-02 | Payer: PRIVATE HEALTH INSURANCE

## 2019-06-02 LAB — TACROLIMUS BLOOD
Lab: 3.4
Lab: 3.8
Lab: 4.1

## 2019-06-02 NOTE — Unmapped (Signed)
Spoke with patient.  He no longer receive assistance with his premium through the [employer].  He received help for 2 months when he first started dialysis.  He has not used [facility] in the past 3 months before transplant.

## 2019-06-03 ENCOUNTER — Encounter: Admit: 2019-06-03 | Discharge: 2019-06-04 | Payer: PRIVATE HEALTH INSURANCE

## 2019-06-03 ENCOUNTER — Other Ambulatory Visit
Admission: RE | Admit: 2019-06-03 | Discharge: 2019-06-03 | Disposition: A | Discharge status: Home / Self Care | Payer: BLUE CROSS/BLUE SHIELD | Source: Ambulatory Visit | Attending: Nephrology | Admitting: Nephrology

## 2019-06-03 DIAGNOSIS — N390 Urinary tract infection, site not specified: Secondary | ICD-10-CM | POA: Insufficient documentation

## 2019-06-03 DIAGNOSIS — D631 Anemia in chronic kidney disease: Secondary | ICD-10-CM | POA: Diagnosis not present

## 2019-06-03 DIAGNOSIS — Z114 Encounter for screening for human immunodeficiency virus [HIV]: Secondary | ICD-10-CM | POA: Diagnosis not present

## 2019-06-03 DIAGNOSIS — E559 Vitamin D deficiency, unspecified: Secondary | ICD-10-CM | POA: Insufficient documentation

## 2019-06-03 DIAGNOSIS — T8610 Unspecified complication of kidney transplant: Secondary | ICD-10-CM | POA: Insufficient documentation

## 2019-06-03 DIAGNOSIS — Z789 Other specified health status: Secondary | ICD-10-CM | POA: Diagnosis not present

## 2019-06-03 DIAGNOSIS — D899 Disorder involving the immune mechanism, unspecified: Secondary | ICD-10-CM | POA: Insufficient documentation

## 2019-06-03 DIAGNOSIS — Z9483 Pancreas transplant status: Secondary | ICD-10-CM | POA: Diagnosis not present

## 2019-06-03 DIAGNOSIS — N189 Chronic kidney disease, unspecified: Secondary | ICD-10-CM | POA: Diagnosis not present

## 2019-06-03 DIAGNOSIS — B259 Cytomegaloviral disease, unspecified: Secondary | ICD-10-CM | POA: Diagnosis not present

## 2019-06-03 DIAGNOSIS — Z79899 Other long term (current) drug therapy: Secondary | ICD-10-CM | POA: Insufficient documentation

## 2019-06-03 DIAGNOSIS — Z940 Kidney transplant status: Secondary | ICD-10-CM | POA: Insufficient documentation

## 2019-06-03 DIAGNOSIS — E1129 Type 2 diabetes mellitus with other diabetic kidney complication: Secondary | ICD-10-CM | POA: Insufficient documentation

## 2019-06-03 LAB — CBC WITH DIFFERENTIAL/PLATELET
Abs Immature Granulocytes: 0.03 10*3/uL (ref 0.00–0.07)
Basophils Absolute: 0 10*3/uL (ref 0.0–0.1)
Basophils Relative: 0 %
Eosinophils Absolute: 0 10*3/uL (ref 0.0–0.5)
Eosinophils Relative: 1 %
HCT: 30.2 % — ABNORMAL LOW (ref 39.0–52.0)
Hemoglobin: 9.8 g/dL — ABNORMAL LOW (ref 13.0–17.0)
Immature Granulocytes: 1 %
Lymphocytes Relative: 2 %
Lymphs Abs: 0.1 10*3/uL — ABNORMAL LOW (ref 0.7–4.0)
MCH: 30.8 pg (ref 26.0–34.0)
MCHC: 32.5 g/dL (ref 30.0–36.0)
MCV: 95 fL (ref 80.0–100.0)
Monocytes Absolute: 0.4 10*3/uL (ref 0.1–1.0)
Monocytes Relative: 8 %
Neutro Abs: 4 10*3/uL (ref 1.7–7.7)
Neutrophils Relative %: 88 %
Platelets: 212 10*3/uL (ref 150–400)
RBC: 3.18 MIL/uL — ABNORMAL LOW (ref 4.22–5.81)
RDW: 15.6 % — ABNORMAL HIGH (ref 11.5–15.5)
WBC: 4.5 10*3/uL (ref 4.0–10.5)
nRBC: 0 % (ref 0.0–0.2)

## 2019-06-03 LAB — BASIC METABOLIC PANEL
Anion gap: 10 (ref 5–15)
BUN: 41 mg/dL — ABNORMAL HIGH (ref 6–20)
CO2: 21 mmol/L — ABNORMAL LOW (ref 22–32)
Calcium: 9.4 mg/dL (ref 8.9–10.3)
Chloride: 110 mmol/L (ref 98–111)
Creatinine, Ser: 1.7 mg/dL — ABNORMAL HIGH (ref 0.61–1.24)
GFR calc Af Amer: 59 mL/min — ABNORMAL LOW (ref >60)
GFR calc non Af Amer: 51 mL/min — ABNORMAL LOW (ref >60)
Glucose, Bld: 83 mg/dL (ref 70–99)
Potassium: 4.1 mmol/L (ref 3.5–5.1)
Sodium: 141 mmol/L (ref 135–145)

## 2019-06-03 LAB — PHOSPHORUS: Phosphorus: 2.7 mg/dL (ref 2.5–4.6)

## 2019-06-03 LAB — MAGNESIUM: Magnesium: 1.6 mg/dL — ABNORMAL LOW (ref 1.7–2.4)

## 2019-06-04 NOTE — Unmapped (Signed)
Kidney Transplant Biopsy Note    Date of Biopsy Referral: 06/04/19    Referring Transplant Provider: Dr. Leeroy Bock  Pager:   Phone:  Kidney Transplant Coordinator: Thayer Headings, RN    Biopsy Fellow  _____________________ at _______________________ otherwise contact referring provider.     (NAME)   (CONTACT - pager/cell phone #)    ---------------------------------------------------------------------------------------------------------------------  Ryan Moon?: Yes  Patient Name: [name]  MR: [id number]  Age: 33 y.o.  Sex: Male Race: White or Caucasian Ethnicity: Not Hispanic or Latino   DOB:Feb 25, 1986    Procedures: Ultrasound Guided Percutaneous Kidney Biopsy under Moderate Sedation  Tissue Submitted: Kidney  Special Studies Required: LM, IF, EM  ----------------------------------------------------------------------------------------------------------------------  Date of allograft implantation: 04/28/2019  ABO Incompatible:  Underlying native kidney disease: ANCA positive vasculitis  Was the diagnosis established by biopsy?   Previous transplant biopsies: No  If yes, what were the previous diagnoses?   Previous kidney transplants: No If yes, this is #:     Donor Information (if available)  Age of donor:   Gender: Male  Race:   Type of donor: Living unrelated  Ischemia time:   Delayed graft function: No  If yes, how many days on dialysis:     History/Clinical Diagnosis/Indication for Biopsy: creatinine has remained between 1.4-1.7    ----------------------------------------------------------------------------------------------------------------------  Current Baseline Immunosuppression: prednisone, tacrolimus (Prograf or Envarsus) and mycophenolate (Cellcept or Myfortic) (please select ALL drugs used)  Specific anti-rejection treatment WITHIN 2 WEEKS before biopsy: No  If yes, what was the type of treatment? None  Patient off immunosuppression?: No  Patient seems adherent to immunosuppression? Yes Patient is currently back on dialysis? No  Has patient had any prior episodes of rejection? No   If yes, what was the treatment? None  ----------------------------------------------------------------------------------------------------------------------  Donor Specific Antibody Results:  No results found for: Vanessa Kick, MFI11, DSA12, DSA13, MFI13, DSA14, MFI14, DSA15, MFI15, DSA16, MFI16, DSA21, MFI21, DSA22, MFI22, DSA23, MFI23, DSA25, MFI25, DSA26, MFI26, DSA1C      Blood Pressure (mmHg):   BP Readings from Last 3 Encounters:   05/26/19 125/92   05/26/19 135/81   05/26/19 135/81     On Anti-hypertensive Therapy: Yes    Urinalysis:  Lab Results   Component Value Date    Color, UA Light Yellow 05/26/2019    Specific Gravity, UA 1.016 05/26/2019    pH, UA 6.0 05/26/2019    Glucose, UA Negative 05/26/2019    Ketones, UA Negative 05/26/2019    Blood, UA Negative 05/26/2019    Nitrite, UA Negative 05/26/2019    Leukocyte Esterase, UA Negative 05/26/2019    Urobilinogen, UA 0.2 mg/dL 81/19/1478    Bilirubin, UA Negative 05/26/2019     Urine protein/creatinine ratio:   Lab Results   Component Value Date    Protein/Creatinine Ratio, Urine 0.207 05/11/2019       Creatinine (present peak): 2.29 in care everywhere from 12/28  Creatinine (baseline): 1.49-1.79  Lab Results   Component Value Date    CREATININE 1.60 (H) 05/26/2019    CREATININE 1.70 (H) 05/26/2019    CREATININE 1.79 (H) 05/25/2019    CREATININE 1.59 (H) 05/22/2019    CREATININE 1.49 (H) 05/20/2019       Glucose:   Lab Results   Component Value Date    Glucose 94 05/26/2019     HGBA1C:   Lab Results   Component Value Date    Hemoglobin A1C <4.0 (L) 04/16/2019     ----------------------------------------------------------------------------------------------------------------------  Clinical signs of  infections at time of current biopsy:  BK: No   BK blood viral load: No results found for: BBKQR, BBKCP, BBK10, BBKC   Urine decoy cells present: No    CMV: No CMV viral load:   Lab Results   Component Value Date    CMV Viral Ld Not Detected 05/26/2019        EBV:No   EBV viral load: No results found for: EBVIU, EBVLOG    HSV: No  Hepatitis B: No  Hepatitis C: No  Bacteria: No  Fungi: No  Urinary tract infection: No  Other:   ----------------------------------------------------------------------------------------------------------------------  Stenosis of renal artery: No  Obstruction of ureter: No  Lymphocele: No

## 2019-06-04 NOTE — Unmapped (Signed)
Reviewed creatinine of 12/28 2.29 with decrease to 1.7 on 12/30 with Dr. Nestor Lewandowsky, because of plateau in creatinine he would like pt to get biopsy. Pt made aware. Message sent to scheduler [employer].

## 2019-06-05 ENCOUNTER — Encounter
Admission: RE | Admit: 2019-06-05 | Discharge: 2019-06-05 | Disposition: A | Discharge status: Home / Self Care | Payer: BLUE CROSS/BLUE SHIELD | Source: Ambulatory Visit | Attending: Nephrology | Admitting: Nephrology

## 2019-06-05 DIAGNOSIS — E1122 Type 2 diabetes mellitus with diabetic chronic kidney disease: Secondary | ICD-10-CM | POA: Diagnosis not present

## 2019-06-05 DIAGNOSIS — D899 Disorder involving the immune mechanism, unspecified: Secondary | ICD-10-CM | POA: Diagnosis present

## 2019-06-05 DIAGNOSIS — Z940 Kidney transplant status: Secondary | ICD-10-CM | POA: Insufficient documentation

## 2019-06-05 DIAGNOSIS — Z79899 Other long term (current) drug therapy: Secondary | ICD-10-CM | POA: Insufficient documentation

## 2019-06-05 DIAGNOSIS — B259 Cytomegaloviral disease, unspecified: Secondary | ICD-10-CM | POA: Diagnosis not present

## 2019-06-05 DIAGNOSIS — Z114 Encounter for screening for human immunodeficiency virus [HIV]: Secondary | ICD-10-CM | POA: Insufficient documentation

## 2019-06-05 DIAGNOSIS — Z9483 Pancreas transplant status: Secondary | ICD-10-CM | POA: Insufficient documentation

## 2019-06-05 DIAGNOSIS — E559 Vitamin D deficiency, unspecified: Secondary | ICD-10-CM | POA: Insufficient documentation

## 2019-06-05 DIAGNOSIS — Z789 Other specified health status: Secondary | ICD-10-CM | POA: Diagnosis not present

## 2019-06-05 DIAGNOSIS — N390 Urinary tract infection, site not specified: Secondary | ICD-10-CM | POA: Insufficient documentation

## 2019-06-05 DIAGNOSIS — D631 Anemia in chronic kidney disease: Secondary | ICD-10-CM | POA: Diagnosis not present

## 2019-06-05 DIAGNOSIS — E1129 Type 2 diabetes mellitus with other diabetic kidney complication: Secondary | ICD-10-CM | POA: Diagnosis not present

## 2019-06-05 LAB — BASIC METABOLIC PANEL
Anion gap: 7 (ref 5–15)
BUN: 41 mg/dL — ABNORMAL HIGH (ref 6–20)
CO2: 22 mmol/L (ref 22–32)
Calcium: 9.5 mg/dL (ref 8.9–10.3)
Chloride: 111 mmol/L (ref 98–111)
Creatinine, Ser: 1.44 mg/dL — ABNORMAL HIGH (ref 0.61–1.24)
GFR calc Af Amer: >60 mL/min (ref >60)
GFR calc non Af Amer: >60 mL/min (ref >60)
Glucose, Bld: 107 mg/dL — ABNORMAL HIGH (ref 70–99)
Potassium: 4 mmol/L (ref 3.5–5.1)
Sodium: 140 mmol/L (ref 135–145)

## 2019-06-05 LAB — CBC WITH DIFFERENTIAL/PLATELET
Abs Immature Granulocytes: 0.04 10*3/uL (ref 0.00–0.07)
Basophils Absolute: 0 10*3/uL (ref 0.0–0.1)
Basophils Relative: 1 %
Eosinophils Absolute: 0 10*3/uL (ref 0.0–0.5)
Eosinophils Relative: 1 %
HCT: 29.5 % — ABNORMAL LOW (ref 39.0–52.0)
Hemoglobin: 9.3 g/dL — ABNORMAL LOW (ref 13.0–17.0)
Immature Granulocytes: 1 %
Lymphocytes Relative: 2 %
Lymphs Abs: 0.1 10*3/uL — ABNORMAL LOW (ref 0.7–4.0)
MCH: 30.3 pg (ref 26.0–34.0)
MCHC: 31.5 g/dL (ref 30.0–36.0)
MCV: 96.1 fL (ref 80.0–100.0)
Monocytes Absolute: 0.3 10*3/uL (ref 0.1–1.0)
Monocytes Relative: 8 %
Neutro Abs: 3.4 10*3/uL (ref 1.7–7.7)
Neutrophils Relative %: 87 %
Platelets: 240 10*3/uL (ref 150–400)
RBC: 3.07 MIL/uL — ABNORMAL LOW (ref 4.22–5.81)
RDW: 15.9 % — ABNORMAL HIGH (ref 11.5–15.5)
WBC: 3.9 10*3/uL — ABNORMAL LOW (ref 4.0–10.5)
nRBC: 0 % (ref 0.0–0.2)

## 2019-06-05 LAB — PHOSPHORUS: Phosphorus: 2.8 mg/dL (ref 2.5–4.6)

## 2019-06-05 LAB — MAGNESIUM: Magnesium: 1.6 mg/dL — ABNORMAL LOW (ref 1.7–2.4)

## 2019-06-06 ENCOUNTER — Encounter: Admit: 2019-06-06 | Discharge: 2019-06-06 | Payer: PRIVATE HEALTH INSURANCE

## 2019-06-06 LAB — HLA DS POST TRANSPLANT
ANTI-DONOR DRW #1 MFI: 50 MFI
ANTI-DONOR DRW #2 MFI: 8 MFI
ANTI-DONOR HLA-A #1 MFI: 19 MFI
ANTI-DONOR HLA-A #2 MFI: 43 MFI
ANTI-DONOR HLA-B #1 MFI: 0 MFI
ANTI-DONOR HLA-B #2 MFI: 0 MFI
ANTI-DONOR HLA-C #1 MFI: 0 MFI
ANTI-DONOR HLA-C #2 MFI: 0 MFI
ANTI-DONOR HLA-DQB #1 MFI: 0 MFI
ANTI-DONOR HLA-DQB #2 MFI: 43 MFI
ANTI-DONOR HLA-DR #1 MFI: 26 MFI
ANTI-DONOR HLA-DR #2 MFI: 86 MFI

## 2019-06-06 LAB — FSAB CLASS 1 ANTIBODY SPECIFICITY: HLA CLASS 1 ANTIBODY RESULT: NEGATIVE

## 2019-06-06 LAB — FSAB CLASS 2 ANTIBODY SPECIFICITY: HLA CL2 AB RESULT: NEGATIVE

## 2019-06-06 LAB — HLA CL1 ANTIBODY COMM: Lab: 0

## 2019-06-06 LAB — HLA CL2 AB COMMENT: Lab: 0

## 2019-06-06 LAB — DSA COMMENT: Lab: 0

## 2019-06-08 ENCOUNTER — Encounter
Admit: 2019-06-08 | Discharge: 2019-06-08 | Payer: PRIVATE HEALTH INSURANCE | Attending: Nephrology | Primary: Nephrology

## 2019-06-08 ENCOUNTER — Other Ambulatory Visit: Payer: Self-pay

## 2019-06-08 ENCOUNTER — Other Ambulatory Visit
Admission: RE | Admit: 2019-06-08 | Discharge: 2019-06-08 | Disposition: A | Discharge status: Home / Self Care | Payer: BLUE CROSS/BLUE SHIELD | Source: Ambulatory Visit | Attending: Nephrology | Admitting: Nephrology

## 2019-06-08 DIAGNOSIS — Z940 Kidney transplant status: Principal | ICD-10-CM

## 2019-06-08 DIAGNOSIS — R7989 Other specified abnormal findings of blood chemistry: Principal | ICD-10-CM

## 2019-06-08 DIAGNOSIS — N390 Urinary tract infection, site not specified: Secondary | ICD-10-CM | POA: Diagnosis not present

## 2019-06-08 DIAGNOSIS — D899 Disorder involving the immune mechanism, unspecified: Secondary | ICD-10-CM | POA: Insufficient documentation

## 2019-06-08 DIAGNOSIS — Z789 Other specified health status: Secondary | ICD-10-CM | POA: Diagnosis not present

## 2019-06-08 DIAGNOSIS — Z114 Encounter for screening for human immunodeficiency virus [HIV]: Secondary | ICD-10-CM | POA: Insufficient documentation

## 2019-06-08 DIAGNOSIS — Z9483 Pancreas transplant status: Secondary | ICD-10-CM | POA: Diagnosis not present

## 2019-06-08 DIAGNOSIS — B259 Cytomegaloviral disease, unspecified: Secondary | ICD-10-CM | POA: Insufficient documentation

## 2019-06-08 DIAGNOSIS — D631 Anemia in chronic kidney disease: Secondary | ICD-10-CM | POA: Diagnosis not present

## 2019-06-08 DIAGNOSIS — Z79899 Other long term (current) drug therapy: Secondary | ICD-10-CM | POA: Insufficient documentation

## 2019-06-08 DIAGNOSIS — N189 Chronic kidney disease, unspecified: Secondary | ICD-10-CM | POA: Insufficient documentation

## 2019-06-08 DIAGNOSIS — E559 Vitamin D deficiency, unspecified: Secondary | ICD-10-CM | POA: Diagnosis not present

## 2019-06-08 DIAGNOSIS — E1129 Type 2 diabetes mellitus with other diabetic kidney complication: Secondary | ICD-10-CM | POA: Insufficient documentation

## 2019-06-08 DIAGNOSIS — Z09 Encounter for follow-up examination after completed treatment for conditions other than malignant neoplasm: Secondary | ICD-10-CM | POA: Diagnosis not present

## 2019-06-08 LAB — CBC WITH DIFFERENTIAL/PLATELET
Abs Immature Granulocytes: 0.03 10*3/uL (ref 0.00–0.07)
Basophils Absolute: 0 10*3/uL (ref 0.0–0.1)
Basophils Relative: 0 %
Eosinophils Absolute: 0 10*3/uL (ref 0.0–0.5)
Eosinophils Relative: 1 %
HCT: 26.2 % — ABNORMAL LOW (ref 39.0–52.0)
Hemoglobin: 8.7 g/dL — ABNORMAL LOW (ref 13.0–17.0)
Immature Granulocytes: 1 %
Lymphocytes Relative: 2 %
Lymphs Abs: 0.1 10*3/uL — ABNORMAL LOW (ref 0.7–4.0)
MCH: 30.2 pg (ref 26.0–34.0)
MCHC: 33.2 g/dL (ref 30.0–36.0)
MCV: 91 fL (ref 80.0–100.0)
Monocytes Absolute: 0.3 10*3/uL (ref 0.1–1.0)
Monocytes Relative: 8 %
Neutro Abs: 3.1 10*3/uL (ref 1.7–7.7)
Neutrophils Relative %: 88 %
Platelets: 229 10*3/uL (ref 150–400)
RBC: 2.88 MIL/uL — ABNORMAL LOW (ref 4.22–5.81)
RDW: 15.8 % — ABNORMAL HIGH (ref 11.5–15.5)
WBC: 3.5 10*3/uL — ABNORMAL LOW (ref 4.0–10.5)
nRBC: 0 % (ref 0.0–0.2)

## 2019-06-08 LAB — BASIC METABOLIC PANEL
Anion gap: 9 (ref 5–15)
BUN: 31 mg/dL — ABNORMAL HIGH (ref 6–20)
CO2: 23 mmol/L (ref 22–32)
Calcium: 9.4 mg/dL (ref 8.9–10.3)
Chloride: 107 mmol/L (ref 98–111)
Creatinine, Ser: 1.46 mg/dL — ABNORMAL HIGH (ref 0.61–1.24)
GFR calc Af Amer: >60 mL/min (ref >60)
GFR calc non Af Amer: >60 mL/min (ref >60)
Glucose, Bld: 110 mg/dL — ABNORMAL HIGH (ref 70–99)
Potassium: 4.2 mmol/L (ref 3.5–5.1)
Sodium: 139 mmol/L (ref 135–145)

## 2019-06-08 LAB — MAGNESIUM: Magnesium: 1.6 mg/dL — ABNORMAL LOW (ref 1.7–2.4)

## 2019-06-08 LAB — PHOSPHORUS: Phosphorus: 3.3 mg/dL (ref 2.5–4.6)

## 2019-06-08 NOTE — Unmapped (Signed)
Pt has been educated regarding being NPO at midnight the night before biopsy, will stop aspirin beginning tomorrow (06/08/2018), will hold meds for labs, but bring them with him for after lab draw, he will have a driver over 18 and they will arrive no later than 0700 at [facility] Registration Desk. Email request sent to [employer].

## 2019-06-09 ENCOUNTER — Encounter: Admit: 2019-06-09 | Discharge: 2019-06-10 | Payer: PRIVATE HEALTH INSURANCE

## 2019-06-09 ENCOUNTER — Ambulatory Visit: Admit: 2019-06-09 | Discharge: 2019-06-10 | Payer: PRIVATE HEALTH INSURANCE

## 2019-06-09 DIAGNOSIS — Z940 Kidney transplant status: Principal | ICD-10-CM

## 2019-06-09 LAB — CBC W/ AUTO DIFF
BASOPHILS RELATIVE PERCENT: 0.6 %
EOSINOPHILS ABSOLUTE COUNT: 0 10*9/L (ref 0.0–0.4)
HEMATOCRIT: 29 % — ABNORMAL LOW (ref 41.0–53.0)
HEMOGLOBIN: 9.6 g/dL — ABNORMAL LOW (ref 13.5–17.5)
LARGE UNSTAINED CELLS: 1 % (ref 0–4)
LYMPHOCYTES ABSOLUTE COUNT: 0 10*9/L — ABNORMAL LOW (ref 1.5–5.0)
LYMPHOCYTES RELATIVE PERCENT: 1.5 %
MEAN CORPUSCULAR HEMOGLOBIN CONC: 33 g/dL (ref 31.0–37.0)
MEAN CORPUSCULAR HEMOGLOBIN: 32 pg (ref 26.0–34.0)
MEAN CORPUSCULAR VOLUME: 96.9 fL (ref 80.0–100.0)
MEAN PLATELET VOLUME: 8.7 fL (ref 7.0–10.0)
MONOCYTES ABSOLUTE COUNT: 0.2 10*9/L (ref 0.2–0.8)
MONOCYTES RELATIVE PERCENT: 6.7 %
NEUTROPHILS ABSOLUTE COUNT: 2.5 10*9/L (ref 2.0–7.5)
NEUTROPHILS RELATIVE PERCENT: 89.3 %
PLATELET COUNT: 313 10*9/L (ref 150–440)
RED BLOOD CELL COUNT: 2.99 10*12/L — ABNORMAL LOW (ref 4.50–5.90)
RED CELL DISTRIBUTION WIDTH: 16.8 % — ABNORMAL HIGH (ref 12.0–15.0)
WBC ADJUSTED: 2.8 10*9/L — ABNORMAL LOW (ref 4.5–11.0)

## 2019-06-09 LAB — BASIC METABOLIC PANEL
ANION GAP: 6 mmol/L — ABNORMAL LOW (ref 7–15)
BLOOD UREA NITROGEN: 30 mg/dL — ABNORMAL HIGH (ref 7–21)
CALCIUM: 9.7 mg/dL (ref 8.5–10.2)
CHLORIDE: 105 mmol/L (ref 98–107)
CO2: 26 mmol/L (ref 22.0–30.0)
EGFR CKD-EPI AA MALE: 65 mL/min/{1.73_m2} (ref >=60)
EGFR CKD-EPI NON-AA MALE: 56 mL/min/{1.73_m2} — ABNORMAL LOW (ref >=60)
GLUCOSE RANDOM: 95 mg/dL (ref 70–99)
POTASSIUM: 4.7 mmol/L (ref 3.5–5.0)
SODIUM: 137 mmol/L (ref 135–145)

## 2019-06-09 LAB — PROTIME: Coagulation tissue factor induced:Time:Pt:PPP:Qn:Coag: 11.1

## 2019-06-09 LAB — PROTIME-INR: INR: 0.93

## 2019-06-09 LAB — MONOCYTES RELATIVE PERCENT: Monocytes/100 leukocytes:NFr:Pt:Bld:Qn:Automated count: 6.7

## 2019-06-09 LAB — APTT: Coagulation surface induced:Time:Pt:PPP:Qn:Coag: 28.1

## 2019-06-09 LAB — ANION GAP: Anion gap 3:SCnc:Pt:Ser/Plas:Qn:: 6 — ABNORMAL LOW

## 2019-06-09 NOTE — Unmapped (Signed)
Reviewed pre-biopsy labs with Dr. Nestor Lewandowsky. Cr modestly improved to 1.58. Pt took ASA yesterday.     Will hold of on biopsy at this time and plan for outpatient lab recheck on 06/11/19. If recheck Cr is > 1.8, will bring him back on Friday 06/12/19 for biopsy. Labs to be done at [facility] given that he will be on campus for his ureteral stent removal. He is to continue holding ASA in the interim.     Message sent to coordinator [facility] with updates.    All questions and concerns answered.    Armandina Stammer, MD  Nephrology Fellow

## 2019-06-09 NOTE — Unmapped (Signed)
Pre Renal Biopsy Assessment Note    Assessment/Plan:    Mr. Beever is a 34 y.o. male who will undergo Ultrasound-guided transplant kidney biopsy    1. Indications and risks/benefits of procedure reviewed with patient.    2. Consent signed and present on patient's charge.   3. No cardiopulmonary or other medical contraindications present therefore will proceed with biopsy.       CC: [place] Biopsy Indication: Assess for Transplant Rejection    HPI: Mr. Prehn is a 34 y.o. male who will undergo Ultrasound-guided transplant kidney biopsy with moderate sedation.    Allergies:  Acetaminophen    Medications:   Prior to Admission medications    Medication Sig Start Date End Date Taking? Authorizing Provider   ALPRAZolam (XANAX) 0.25 MG tablet Take 0.5 mg by mouth every twelve (12) hours.  05/02/18  Yes Historical Provider, MD   amLODIPine (NORVASC) 10 MG tablet Take 10 mg by mouth daily. 10/18/17  Yes Historical Provider, MD   aspirin (ECOTRIN) 81 MG tablet Take 1 tablet (81 mg total) by mouth daily. 04/29/19 04/28/20 Yes Jennifer Quay Burow Vonderau, PA   cloNIDine HCL (CATAPRES) 0.2 MG tablet Take 1 tablet (0.2 mg total) by mouth Three (3) times a day. 05/01/19 04/30/20 Yes Larina Earthly, MD   doxycycline (VIBRAMYCIN) 100 MG capsule Take 1 capsule (100 mg total) by mouth Two (2) times a day for 10 days. 05/30/19 06/09/19 Yes Chrissie Noa, FNP   escitalopram oxalate (LEXAPRO) 10 MG tablet Take 15 mg by mouth. 01/15/19 07/01/19 Yes Historical Provider, MD   gabapentin (NEURONTIN) 100 MG capsule Take 3 capsules (300 mg total) by mouth Three (3) times a day. 05/15/19 06/14/19 Yes Wallace Cullens Mincemoyer, CPP   hydrALAZINE (APRESOLINE) 100 MG tablet Take 1 tablet (100 mg total) by mouth Three (3) times a day. 05/01/19 04/25/20 Yes Larina Earthly, MD   magnesium oxide-Mg AA chelate (MAGNESIUM, AMINO ACID CHELATE,) 133 mg Tab Take 1 tablet by mouth Two (2) times a day. 04/29/19  Yes Jennifer Shurney Vonderau, PA mycophenolate (MYFORTIC) 180 MG EC tablet Take 3 tablets (540 mg total) by mouth Two (2) times a day. 05/06/19 05/05/20 Yes Heather Brigitte Pulse, AGNP   omeprazole (PRILOSEC) 20 MG capsule Take 20 mg by mouth two (2) times a day.    Yes Historical Provider, MD   predniSONE (DELTASONE) 10 MG tablet Take 10 mg by mouth daily.  02/06/18  Yes Historical Provider, MD   tacrolimus (ENVARSUS XR) 1 mg Tb24 extended release tablet Take 1 tablet (1 mg) by mouth daily along with two 4 mg tablets for total dose of 9 mg daily 05/26/19 05/25/20 Yes Griffin Dakin, MD   tacrolimus (ENVARSUS XR) 4 mg Tb24 extended release tablet Take 2 tablets (8 mg) tablets once daily in addition to one 1 mg tablet for total daily dose of 9 mg daily. 05/26/19  Yes Griffin Dakin, MD   valGANciclovir (VALCYTE) 450 mg tablet Take 1 tablet (450 mg total) by mouth daily. 04/29/19 07/28/19 Yes Jennifer Kizzie Furnish, PA   acetaminophen (TYLENOL EXTRA STRENGTH) 500 MG tablet Take 2 tablets (1,000 mg total) by mouth every six (6) hours as needed for pain or fever (> 38C).  Patient not taking: Reported on 06/09/2019 05/11/19 05/10/20  Drake Leach, PA   docusate sodium (COLACE) 100 MG capsule Take 1 capsule (100 mg total) by mouth two (2) times a day as needed for constipation. 04/29/19   Drake Leach,  PA   oxyCODONE (ROXICODONE) 5 MG immediate release tablet Take 1 tablet (5 mg total) by mouth every six (6) hours as needed for up to 5 days. 05/15/19 05/20/19  Wallace Cullens Mincemoyer, CPP   polyethylene glycol (GLYCOLAX) 17 gram/dose powder Dissolve 1 capful (17g) in 8 oz of juice or water daily as needed 04/29/19 05/31/19  Drake Leach, PA   tamsulosin [facility]) 0.4 mg capsule Take 1 capsule (0.4 mg total) by mouth daily. 05/03/19 06/02/19  Nelia Shi, MD traMADoL Janean Sark) 50 mg tablet Take 1 tablet (50 mg total) by mouth every eight (8) hours as needed for pain for up to 4 days. 05/26/19 05/30/19  Cordelia Poche, AGNP       Medical History:  Past Medical History:   Diagnosis Date   ??? Anemia    ??? HTN (hypertension)    ??? Renal vasculitis (CMS-HCC)    ??? Secondary hyperparathyroidism (CMS-HCC)        Surgical History:  Past Surgical History:   Procedure Laterality Date   ??? APPENDECTOMY  2013   ??? PR TRANSPLANT,PREP CADAVER RENAL GRAFT Right 04/28/2019    Procedure: [facility] STD PREP CAD DONR RENAL ALLOGFT PRIOR TO TRNSPLNT, INCL DISSEC/REM PERINEPH FAT, DIAPH/RTPER ATTAC;  Surgeon: Doyce Loose, MD;  Location: MAIN OR [facility];  Service: Transplant   ??? 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 His smoking use included cigarettes. He has quit using smokeless tobacco.  His smokeless tobacco use included chew.  Alcohol use:   reports previous alcohol use.  Drug use:  reports previous drug use.    Family History:  No family history on file.    ROS:  10 systems reviewed and are negative unless otherwise mentioned in HPI    ASA Grade: ASA 1 - Normal healthy patient      PE:    Vitals:    06/09/19 0731   BP: 112/68   Pulse: 67   Resp: 18   Temp: 36.8 ??C   SpO2: 99%     General:  Well appearing male in NAD.  Airway assessment: Class 2 - Can visualize soft palate and fauces, tip of uvula is obscured  Cardiovascular:  Regular rate and rhythm.  No murmurs, gallops or rubs.   Lungs: respirations nonlabored; clear to auscultation bilaterally

## 2019-06-10 DIAGNOSIS — Z940 Kidney transplant status: Principal | ICD-10-CM

## 2019-06-10 LAB — CBC W/ DIFFERENTIAL
BASOPHILS ABSOLUTE COUNT: 0 10*9/L
BASOPHILS ABSOLUTE COUNT: 0 10*9/L
BASOPHILS ABSOLUTE COUNT: 0 10*9/L
BASOPHILS RELATIVE PERCENT: 1 %
BASOPHILS RELATIVE PERCENT: 0 %
EOSINOPHILS ABSOLUTE COUNT: 0 10*9/L
EOSINOPHILS ABSOLUTE COUNT: 0 10*9/L
EOSINOPHILS ABSOLUTE COUNT: 0 10*9/L
EOSINOPHILS RELATIVE PERCENT: 1 %
EOSINOPHILS RELATIVE PERCENT: 1 %
HEMATOCRIT: 30.2 % — ABNORMAL LOW
HEMATOCRIT: 29.5 % — ABNORMAL LOW
HEMATOCRIT: 26.2 % — ABNORMAL LOW
HEMOGLOBIN: 9.8 g/dL — ABNORMAL LOW
HEMOGLOBIN: 9.3 g/dL — ABNORMAL LOW
LYMPHOCYTES ABSOLUTE COUNT: 0.1 10*9/L — ABNORMAL LOW
LYMPHOCYTES ABSOLUTE COUNT: 0.1 10*9/L — ABNORMAL LOW
LYMPHOCYTES ABSOLUTE COUNT: 0.1 10*9/L — ABNORMAL LOW
LYMPHOCYTES RELATIVE PERCENT: 2 %
LYMPHOCYTES RELATIVE PERCENT: 2 %
LYMPHOCYTES RELATIVE PERCENT: 2 %
MEAN CORPUSCULAR HEMOGLOBIN CONC: 32.5 g/dL
MEAN CORPUSCULAR HEMOGLOBIN CONC: 31.5 g/dL
MEAN CORPUSCULAR HEMOGLOBIN CONC: 33.2 g/dL
MEAN CORPUSCULAR HEMOGLOBIN: 30.8 pg
MEAN CORPUSCULAR HEMOGLOBIN: 30.3 pg
MEAN CORPUSCULAR HEMOGLOBIN: 30.2 pg
MEAN CORPUSCULAR VOLUME: 95 fL
MEAN CORPUSCULAR VOLUME: 96.1 fL
MEAN CORPUSCULAR VOLUME: 91 fL
MONOCYTES ABSOLUTE COUNT: 0.4 10*9/L
MONOCYTES ABSOLUTE COUNT: 0.3 10*9/L
MONOCYTES ABSOLUTE COUNT: 0.3 10*9/L
MONOCYTES RELATIVE PERCENT: 8 %
MONOCYTES RELATIVE PERCENT: 8 %
MONOCYTES RELATIVE PERCENT: 8 %
NEUTROPHILS ABSOLUTE COUNT: 4 10*9/L
NEUTROPHILS ABSOLUTE COUNT: 3.4 10*9/L
NEUTROPHILS ABSOLUTE COUNT: 3.1 10*9/L
NEUTROPHILS RELATIVE PERCENT: 88 %
NEUTROPHILS RELATIVE PERCENT: 87 %
NEUTROPHILS RELATIVE PERCENT: 88 %
PLATELET COUNT: 212 10*9/L
PLATELET COUNT: 229 10*9/L
RED BLOOD CELL COUNT: 3.18 10*12/L — ABNORMAL LOW
RED BLOOD CELL COUNT: 3.07 10*12/L — ABNORMAL LOW
RED BLOOD CELL COUNT: 2.88 10*12/L — ABNORMAL LOW
RED CELL DISTRIBUTION WIDTH: 15.6 % — ABNORMAL HIGH
RED CELL DISTRIBUTION WIDTH: 15.9 % — ABNORMAL HIGH
RED CELL DISTRIBUTION WIDTH: 15.8 % — ABNORMAL HIGH
WBC ADJUSTED: 4.5 10*9/L
WBC ADJUSTED: 3.9 10*9/L — ABNORMAL LOW
WBC ADJUSTED: 3.5 10*9/L — ABNORMAL LOW

## 2019-06-10 LAB — BASIC METABOLIC PANEL
BLOOD UREA NITROGEN: 41 mg/dL — ABNORMAL HIGH
BLOOD UREA NITROGEN: 41 mg/dL — ABNORMAL HIGH
BLOOD UREA NITROGEN: 31 mg/dL — ABNORMAL HIGH
CALCIUM: 9.4 mg/dL
CALCIUM: 9.5 mg/dL
CALCIUM: 9.4 mg/dL
CHLORIDE: 110 mmol/L
CHLORIDE: 111 mmol/L
CHLORIDE: 107 mmol/L
CO2: 21 mmol/L — ABNORMAL LOW
CO2: 23 mmol/L
CREATININE: 1.7 mg/dL — ABNORMAL HIGH
CREATININE: 1.44 mg/dL — ABNORMAL HIGH
CREATININE: 1.46 mg/dL — ABNORMAL HIGH
EGFR CKD-EPI AA MALE: 59 mL/min/{1.73_m2} — ABNORMAL LOW
EGFR CKD-EPI NON-AA MALE: 51 mL/min/{1.73_m2} — ABNORMAL LOW
GLUCOSE RANDOM: 83 mg/dL
GLUCOSE RANDOM: 107 mg/dL — ABNORMAL HIGH
GLUCOSE RANDOM: 110 mg/dL
POTASSIUM: 4 mmol/L
POTASSIUM: 4.2 mmol/L
SODIUM: 141 mmol/L
SODIUM: 140 mmol/L
SODIUM: 139 mmol/L

## 2019-06-10 LAB — SODIUM: Lab: 141

## 2019-06-10 LAB — MONOCYTES RELATIVE PERCENT: Lab: 8

## 2019-06-10 LAB — EOSINOPHILS RELATIVE PERCENT
Lab: 1
Lab: 1

## 2019-06-10 LAB — TACROLIMUS BLOOD: Lab: 10.2

## 2019-06-10 LAB — PHOSPHORUS
Lab: 2.7
Lab: 2.8
Lab: 3.3

## 2019-06-10 LAB — MAGNESIUM
Lab: 1.6 — ABNORMAL LOW
Lab: 1.6 — ABNORMAL LOW
Lab: 1.6 — ABNORMAL LOW

## 2019-06-10 LAB — EGFR CKD-EPI NON-AA MALE: Lab: 0

## 2019-06-10 LAB — EGFR CKD-EPI NON-AA FEMALE: Lab: 0

## 2019-06-11 ENCOUNTER — Ambulatory Visit: Admit: 2019-06-11 | Discharge: 2019-06-12 | Payer: PRIVATE HEALTH INSURANCE

## 2019-06-11 DIAGNOSIS — D899 Disorder involving the immune mechanism, unspecified: Principal | ICD-10-CM

## 2019-06-11 DIAGNOSIS — Z466 Encounter for fitting and adjustment of urinary device: Principal | ICD-10-CM

## 2019-06-11 DIAGNOSIS — Z940 Kidney transplant status: Principal | ICD-10-CM

## 2019-06-11 DIAGNOSIS — Z0000 Encounter for general adult medical examination without abnormal findings: Principal | ICD-10-CM

## 2019-06-11 LAB — CBC W/ AUTO DIFF
BASOPHILS ABSOLUTE COUNT: 0 10*9/L (ref 0.0–0.1)
BASOPHILS RELATIVE PERCENT: 0.4 %
EOSINOPHILS ABSOLUTE COUNT: 0 10*9/L (ref 0.0–0.7)
EOSINOPHILS RELATIVE PERCENT: 0.8 %
HEMATOCRIT: 26.6 % — ABNORMAL LOW (ref 38.0–50.0)
HEMOGLOBIN: 9.1 g/dL — ABNORMAL LOW (ref 13.5–17.5)
LYMPHOCYTES ABSOLUTE COUNT: 0 10*9/L — ABNORMAL LOW (ref 0.7–4.0)
LYMPHOCYTES RELATIVE PERCENT: 1.4 %
MEAN CORPUSCULAR HEMOGLOBIN CONC: 34.4 g/dL (ref 30.0–36.0)
MEAN CORPUSCULAR HEMOGLOBIN: 31.9 pg (ref 26.0–34.0)
MEAN CORPUSCULAR VOLUME: 92.8 fL (ref 81.0–95.0)
MEAN PLATELET VOLUME: 6.7 fL — ABNORMAL LOW (ref 7.0–10.0)
MONOCYTES ABSOLUTE COUNT: 0.4 10*9/L (ref 0.1–1.0)
MONOCYTES RELATIVE PERCENT: 10.9 %
NEUTROPHILS ABSOLUTE COUNT: 3 10*9/L (ref 1.7–7.7)
NEUTROPHILS RELATIVE PERCENT: 86.5 %
PLATELET COUNT: 282 10*9/L (ref 150–450)
RED BLOOD CELL COUNT: 2.87 10*12/L — ABNORMAL LOW (ref 4.32–5.72)
RED CELL DISTRIBUTION WIDTH: 16.7 % — ABNORMAL HIGH (ref 12.0–15.0)
WBC ADJUSTED: 3.5 10*9/L (ref 3.5–10.5)

## 2019-06-11 LAB — RENAL FUNCTION PANEL
ALBUMIN: 4 g/dL (ref 3.5–5.0)
ANION GAP: 8 mmol/L (ref 7–15)
BUN / CREAT RATIO: 19
CALCIUM: 9.5 mg/dL (ref 8.5–10.2)
CHLORIDE: 105 mmol/L (ref 98–107)
CO2: 24 mmol/L (ref 22.0–30.0)
CREATININE: 1.59 mg/dL — ABNORMAL HIGH (ref 0.70–1.30)
EGFR CKD-EPI AA MALE: 64 mL/min/{1.73_m2} (ref >=60)
EGFR CKD-EPI NON-AA MALE: 55 mL/min/{1.73_m2} — ABNORMAL LOW (ref >=60)
GLUCOSE RANDOM: 75 mg/dL (ref 70–179)
PHOSPHORUS: 3.1 mg/dL (ref 2.9–4.7)
POTASSIUM: 4.8 mmol/L (ref 3.5–5.0)
SODIUM: 137 mmol/L (ref 135–145)

## 2019-06-11 LAB — GLUCOSE RANDOM: Glucose:MCnc:Pt:Ser/Plas:Qn:: 75

## 2019-06-11 LAB — HEMATOCRIT: Hematocrit:VFr:Pt:Bld:Qn:: 26.6 — ABNORMAL LOW

## 2019-06-11 LAB — MAGNESIUM: Magnesium:MCnc:Pt:Ser/Plas:Qn:: 1.6

## 2019-06-11 NOTE — Unmapped (Signed)
Recheck creatinine stable at 1.59. Will not pursue transplant renal biopsy at this time. Pt called and informed. He was instructed to resume his usually 3x weekly labs per post-transplant protocol. All questions and concerns answered.    Armandina Stammer, MD  Nephrology Fellow

## 2019-06-11 NOTE — Unmapped (Signed)
Storz [id number]

## 2019-06-11 NOTE — Unmapped (Signed)
[facility]:  Taking Care of Yourself After Cystoscopy Procedures    *Drink plenty of water for a day or two following your procedure.  Try to have about 8 ounces (one cup) at a time, and do this 6 times or more per day.  (If you have fluid restrictions, please ask the nurse or doctor for advice).    *AVOID alcoholic, carbonated and caffeinated drinks for a day or two, as they may cause uncomfortable symptoms.    *For the first 8 hours after the procedure, your urine may be pink or red in color.  Small clots or a few drops of blood can be a normal side effect of the instruments.  Large amounts of bleeding or difficulty urinating are not normal.  Call your doctor if this happens.    *You may experience some mild discomfort of a burning sensation with urination after having this procedure.  If it does not improve, or if other symptoms appear (fever, chills, or difficulty emptying), call your doctor.    *You may return to normal daily activities such as work, school, driving, exercising and housework.    *If your doctor gave you a prescription, take it as ordered.    *If you need a return appointment, the secretary will make it for you when you check out. To contact the Urology Clinic during business hours, call [phone].    *[facility] Operator can be reached at [phone] if you need to get in contact with your doctor.  After the hours, the operator can page the doctor on call for urgent concerns:    Urology Patients should ask for the Urology resident “on call”.    You can get more immediate assistance at the Emergency Room or Urgent Care if necessary.

## 2019-06-11 NOTE — Unmapped (Signed)
Procedures  33yM s/p renal transplant    Cystoscopy, ureteral stent removal  Timeout was performed immediately prior to the procedure.    The patient was prepped and draped in the usual sterile fashion.  Flexible cystoscopy was performed.  The indwelling transplant ureteral stent was visualized, grasped, and removed intact.  The patient tolerated the procedure well and was not given one dose of antibiotic prophylaxis afterwards.    Plan: return prn

## 2019-06-12 LAB — TACROLIMUS BLOOD: Lab: 5.9

## 2019-06-12 LAB — VITAMIN D, TOTAL (25OH): Lab: 14.6 — ABNORMAL LOW

## 2019-06-12 NOTE — Unmapped (Signed)
Transplant Nephrology Clinic Visit      History of Present Illness    34 y.o. male here for follow up after kidney transplantation.  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  Patient has been on dialysis since 12/16/2017.  Patient's history includes HTN and anemia of CKD    Patient was admitted for kidney transplant on 04/28/2019.  In the PACU a peri-incisional hematoma was found.  US showed minor increase in resistive indices in the kidney and a perinephric hematoma.  [place] on 04/29/19 showed improved renal perfusion and decreased resistive indices with persistent hematoma.  During stay patient was persistently hypertensive.  Patient discharged on 05/02/2019.       Transplant History:    Organ Received: Left LDKT via NKR  Native Kidney Disease: ANCA Vasculitis; cPRA: negative  Date of Transplant: 04/28/19  Post-Transplant Course: Perinephric Hematoma  Prior Transplants: None  Induction: [place]  Date of Ureteral Stent Removal: 06/11/2019  CMV/EBV Status: CMV D-/R+  EBV D+/R+  Rejection Episodes: None  Donor Specific Antibodies: None  Results of Renal Imaging (pre and post):   Pre-Txp 03/27/2019  Normal size with increased echogenicity. No solid masses or calculi. No hydronephrosis.    Post-Txp 05/18/2019  NATIVE KIDNEYS: The native kidneys are small and echogenic. No solid masses or calculi. No hydronephrosis.  ??  TRANSPLANTED KIDNEY: The renal transplant was located in the right lower quadrant. Normal size and echogenicity.  No solid masses or calculi. 5.1 x 2.9 x 1.6 cm septated fluid collection superficial to the right lower quadrant kidney transplant, previously measured 2.4 x 2.1 x 1.5 cm. Double-J ureteral stent with proximal tip in the renal pelvis and distal tip within the bladder. No hydronephrosis.  NATIVE KIDNEYS: The native kidneys are small and echogenic. No solid masses or calculi. No hydronephrosis.  ??  TRANSPLANTED KIDNEY: The renal transplant was located in the right lower quadrant. Normal size and echogenicity.  No solid masses or calculi. 5.1 x 2.9 x 1.6 cm septated fluid collection superficial to the right lower quadrant kidney transplant, previously measured 2.4 x 2.1 x 1.5 cm. Double-J ureteral stent with proximal tip in the renal pelvis and distal tip within the bladder. No hydronephrosis.      Current Immunosuppression Regimen:   Envarsus 6 mg daily  Myfortic 540mg  BID      Subjective/Interval:   At last visit patient was sent to the ED due to feeling achy with chills and sweats.  Renal ultrasound showed a decrease in his perinephric fluid collection with mildly increased velocity on main renal artery anastomosis site.  Per Transplant Surgery patient okay to be discharged. Blood cultures were negative, but patient's urine culture showed 50,000-100,000 cfu/mL Enterococcus Faecalis.  Patient prescribed Doxycycline for 10 days.  Covid screen negative.     Received call on 05/26/19 (after he left the ED) stating transplant surgery was supposed to prescribe pain medication before he left the ED.  I reviewed the PMP Aware database and prescribed Tramadol x 4 days as per Team no narcotics were to be prescribed.  Also requested patient to have a right groin ultrasound to assess for hernia.  Ultrasound on 06/09/19 showed no inguinal hernia.     Kidney biopsy was scheduled for 06/09/2019 due to plateau in creatinine, but was cancelled due to creatinine decreased to 1.58 and patient did not hold his aspirin.  Ureteral stent removed 06/11/2019.     Patient taking only 6 mg of Envarsus.  Confusion over  dose as we thought he was taking 9 mg.     Today patient presents without complaints of pain.  He states the pain has resolved in his groin and abdomen.  He does have mild hand tremors.  BP at home running 120/80's per patient but does go low at times.  He is smoking/eating marijuana twice a day.  He states he feels like the prednisone is making his anxiety worse.  His PCP is working on stopping patient's Xanax and starting him on Buspar. He denies headaches, dizziness, lightheadedness, chest pain, shortness of breath, abdominal pain, n/v/d, edema, dysuria, hematuria, or pain with urination.     Last dose of Prograf: 10 AM    Past Medical History  1. ANCA Vasculitis  2. HTN  3. Anemia of CKD    Review of Systems    Otherwise as per HPI, all other systems reviewed and are negative.    Medications  Current Outpatient Medications   Medication Sig Dispense Refill   ??? acetaminophen (TYLENOL EXTRA STRENGTH) 500 MG tablet Take 2 tablets (1,000 mg total) by mouth every six (6) hours as needed for pain or fever (> 38C). 100 tablet 0   ??? ALPRAZolam (XANAX) 0.25 MG tablet Take 0.5 mg by mouth every twelve (12) hours.      ??? amLODIPine (NORVASC) 10 MG tablet Take 1 tablet (10 mg total) by mouth daily. 90 tablet 3   ??? aspirin (ECOTRIN) 81 MG tablet Take 1 tablet (81 mg total) by mouth daily. 30 tablet 11   ??? busPIRone (BUSPAR) 15 MG tablet Take 15 mg by mouth two (2) times a day.     ??? cloNIDine HCL (CATAPRES) 0.1 MG tablet Take 1 tablet (0.1 mg total) by mouth Two (2) times a day. 60 tablet 11   ??? docusate sodium (COLACE) 100 MG capsule Take 1 capsule (100 mg total) by mouth two (2) times a day as needed for constipation. 100 capsule 11   ??? ergocalciferol (DRISDOL) 1,250 mcg (50,000 unit) capsule Take 1 capsule (50,000 Units total) by mouth once a week. 4 capsule 1   ??? escitalopram oxalate (LEXAPRO) 10 MG tablet Take 15 mg by mouth.     ??? gabapentin (NEURONTIN) 100 MG capsule Take 3 capsules (300 mg total) by mouth Three (3) times a day. 270 capsule 1   ??? hydrALAZINE (APRESOLINE) 100 MG tablet Take 1 tablet (100 mg total) by mouth Two (2) times a day. 30 tablet 11   ??? magnesium oxide-Mg AA chelate (MAGNESIUM, AMINO ACID CHELATE,) 133 mg Tab Take 1 tablet by mouth Two (2) times a day. 60 tablet 11   ??? mycophenolate (MYFORTIC) 180 MG EC tablet Take 3 tablets (540 mg total) by mouth Two (2) times a day. 180 tablet 11   ??? omeprazole (PRILOSEC) 20 MG capsule Take 20 mg by mouth two (2) times a day.      ??? predniSONE (DELTASONE) 5 MG tablet Take 1 tablet (5 mg total) by mouth daily. 30 tablet 11   ??? tacrolimus (ENVARSUS XR) 1 mg Tb24 extended release tablet Take 3 tablets (3 mg total) by mouth daily with 1 (4 mg) tablet for total dose of 7 mg daily. 90 tablet 11   ??? tacrolimus (ENVARSUS XR) 4 mg Tb24 extended release tablet Take 1 tablet (4 mg total) by mouth daily with 3 (1 mg) tablets for total dose of 7 mg daily. 30 tablet 11   ??? valGANciclovir (VALCYTE) 450 mg tablet Take 1  tablet (450 mg total) by mouth daily. 30 tablet 2     No current facility-administered medications for this visit.            Physical Exam  BP 126/88  - Pulse 74  - Temp 36.6 ??C (97.9 ??F) (Tympanic)  - Ht 177.8 cm (5' 10)  - Wt 67.6 kg (149 lb)  - BMI 21.38 kg/m??   General: in minimal distress, skin warm to touch  HEENT: wearing mask, PERRL  Neck: neck supple, no cervical lymphadenopathy appreciated  CV: normal rate, normal rhythm, no murmur, no gallops, no rubs appreciated  Lungs: clear to auscultation bilaterally  Abdomen: soft, non tender over graft site.  Incision healed and c/d/i without erythema or edema.    Extremities:  no edema noted  Musculoskeletal: no visible deformity, normal range of motion.  Pulses: intact distally throughout  Neurologic: awake, alert, and oriented x3      Laboratory Data and Imaging reviewed in EPIC      Assessment:  34 y.o. male status post living unrelated donor kidney transplant on 04/28/2019 as part of NKR, for ESRD secondary to ANCA Vasculitis who presents for routine follow up and post-transplant care.         Recommendations/Plan:     Allograft Function: Renal function stable at 1.67 with baseline of approximately 1.7-1.9 since transplant. No DSAs at this time.  UPC normal at 0.155.  Biopsy to be performed for surveillance due to plateau in creatinine.     Immunosuppression Management [High Risk Medical Decision Making For Drug Therapy Requiring Intensive Monitoring For Toxicity]: Tacrolimus trough level 1.6 (unsure if patient is taking medication correctly). Increase Envarsus to 7 mg.  Targeting tacrolimus trough levels of approximately 8-10 ng/mL.   Continue mycophenolate 540 mg BID. Prednisone decreased to 5 mg    Blood Pressure Management: BP 126/88 at this visit. Due to low BP at home Hydralazine decreased to 100 mg BID and clonidine decreased to 0.1mg  BID.       Lipid Management: Last lipid panel on 03/23/2019.  Patient is currently not on a statin.      Electrolytes: Electrolyte and metabolic parameters in acceptable range.  Will continue to monitor.  Vitamin D level 14.6.  Patient started on Ergocalciferol 50,000 units weekly.     Infectious Prophylaxis and Monitoring: CMV VL not detected 05/26/19.  No decoy cells present 05/11/2019.   The patient continues on Valcyte and Bactrim prophylaxis.     Hx Cytoxan Exposure:  Needs repeat cystogram 2024    Pain at RLQ:  Resolved. No longer on Tramadol. Continues on Gabapentin.     Anxiety. Follows with Dr. Larwance [facility] at [facility].  Currently on Lexapro 15mg .  He is being started on Buspar 15 mg BID in order to wean/stop Xanax use.     Health Maintenance:   Colonoscopy after age 67    Immunizations:   Flu Shot: 03/03/2019  Prevnar 13: 12/30/17  Pneumovax: 12/16/17  No immunizations till 1 year post transplant.    Counseling:  I counseled the patient on:  The need to avoid sun exposure and the use of sunblock while outdoors given the relatively higher risk of skin malignancy in an immunosuppressed state.  The need for adherence to immunosuppression medication.  Patient verbalized understanding.     Follow-Up:  Return to clinic in: 4 weeks  Labs: 3 times a week, but will get labs this week for repeat tac level.   Patient will continue to follow-up with  his primary care provider for non-transplant related issues and medication refills. We have ordered transplant specific labs per the center's guidelines to monitor and assess for toxicities from immunosuppressant drug therapy        Chandler Stofer L. Marina Goodell, MSN, APRN, AGNP-C - Kidney Transplant Nurse Practitioner  [facility]

## 2019-06-15 ENCOUNTER — Encounter
Admit: 2019-06-15 | Discharge: 2019-06-15 | Payer: PRIVATE HEALTH INSURANCE | Attending: Nephrology | Primary: Nephrology

## 2019-06-15 ENCOUNTER — Other Ambulatory Visit: Payer: Self-pay

## 2019-06-15 ENCOUNTER — Other Ambulatory Visit
Admission: RE | Admit: 2019-06-15 | Discharge: 2019-06-15 | Disposition: A | Discharge status: Home / Self Care | Payer: BLUE CROSS/BLUE SHIELD | Source: Ambulatory Visit | Attending: Nephrology | Admitting: Nephrology

## 2019-06-15 DIAGNOSIS — Z940 Kidney transplant status: Principal | ICD-10-CM

## 2019-06-15 DIAGNOSIS — Z79899 Other long term (current) drug therapy: Principal | ICD-10-CM

## 2019-06-15 DIAGNOSIS — B259 Cytomegaloviral disease, unspecified: Secondary | ICD-10-CM | POA: Insufficient documentation

## 2019-06-15 DIAGNOSIS — D899 Disorder involving the immune mechanism, unspecified: Secondary | ICD-10-CM | POA: Insufficient documentation

## 2019-06-15 DIAGNOSIS — N189 Chronic kidney disease, unspecified: Secondary | ICD-10-CM | POA: Diagnosis not present

## 2019-06-15 DIAGNOSIS — Z789 Other specified health status: Secondary | ICD-10-CM | POA: Insufficient documentation

## 2019-06-15 DIAGNOSIS — E1122 Type 2 diabetes mellitus with diabetic chronic kidney disease: Secondary | ICD-10-CM | POA: Insufficient documentation

## 2019-06-15 DIAGNOSIS — E559 Vitamin D deficiency, unspecified: Secondary | ICD-10-CM | POA: Insufficient documentation

## 2019-06-15 DIAGNOSIS — N390 Urinary tract infection, site not specified: Secondary | ICD-10-CM | POA: Diagnosis not present

## 2019-06-15 DIAGNOSIS — Z9483 Pancreas transplant status: Secondary | ICD-10-CM | POA: Diagnosis not present

## 2019-06-15 DIAGNOSIS — E1129 Type 2 diabetes mellitus with other diabetic kidney complication: Secondary | ICD-10-CM | POA: Insufficient documentation

## 2019-06-15 DIAGNOSIS — D631 Anemia in chronic kidney disease: Secondary | ICD-10-CM | POA: Diagnosis not present

## 2019-06-15 DIAGNOSIS — Z114 Encounter for screening for human immunodeficiency virus [HIV]: Secondary | ICD-10-CM | POA: Diagnosis not present

## 2019-06-15 LAB — CBC WITH DIFFERENTIAL/PLATELET
Abs Immature Granulocytes: 0.05 10*3/uL (ref 0.00–0.07)
Basophils Absolute: 0 10*3/uL (ref 0.0–0.1)
Basophils Relative: 0 %
Eosinophils Absolute: 0 10*3/uL (ref 0.0–0.5)
Eosinophils Relative: 0 %
HCT: 28.1 % — ABNORMAL LOW (ref 39.0–52.0)
Hemoglobin: 8.7 g/dL — ABNORMAL LOW (ref 13.0–17.0)
Immature Granulocytes: 1 %
Lymphocytes Relative: 2 %
Lymphs Abs: 0.1 10*3/uL — ABNORMAL LOW (ref 0.7–4.0)
MCH: 29.9 pg (ref 26.0–34.0)
MCHC: 31 g/dL (ref 30.0–36.0)
MCV: 96.6 fL (ref 80.0–100.0)
Monocytes Absolute: 0.5 10*3/uL (ref 0.1–1.0)
Monocytes Relative: 10 %
Neutro Abs: 4.1 10*3/uL (ref 1.7–7.7)
Neutrophils Relative %: 87 %
Platelets: 273 10*3/uL (ref 150–400)
RBC: 2.91 MIL/uL — ABNORMAL LOW (ref 4.22–5.81)
RDW: 15.8 % — ABNORMAL HIGH (ref 11.5–15.5)
WBC: 4.7 10*3/uL (ref 4.0–10.5)
nRBC: 0 % (ref 0.0–0.2)

## 2019-06-15 LAB — BASIC METABOLIC PANEL
Anion gap: 10 (ref 5–15)
BUN: 38 mg/dL — ABNORMAL HIGH (ref 6–20)
CO2: 22 mmol/L (ref 22–32)
Calcium: 9 mg/dL (ref 8.9–10.3)
Chloride: 108 mmol/L (ref 98–111)
Creatinine, Ser: 1.76 mg/dL — ABNORMAL HIGH (ref 0.61–1.24)
GFR calc Af Amer: 57 mL/min — ABNORMAL LOW (ref >60)
GFR calc non Af Amer: 49 mL/min — ABNORMAL LOW (ref >60)
Glucose, Bld: 93 mg/dL (ref 70–99)
Potassium: 3.9 mmol/L (ref 3.5–5.1)
Sodium: 140 mmol/L (ref 135–145)

## 2019-06-15 LAB — MAGNESIUM: Magnesium: 1.8 mg/dL (ref 1.7–2.4)

## 2019-06-15 LAB — PHOSPHORUS: Phosphorus: 3.1 mg/dL (ref 2.5–4.6)

## 2019-06-16 ENCOUNTER — Encounter: Admit: 2019-06-16 | Discharge: 2019-06-16 | Payer: PRIVATE HEALTH INSURANCE

## 2019-06-16 ENCOUNTER — Ambulatory Visit: Admit: 2019-06-16 | Discharge: 2019-06-16 | Payer: PRIVATE HEALTH INSURANCE

## 2019-06-16 DIAGNOSIS — Z940 Kidney transplant status: Principal | ICD-10-CM

## 2019-06-16 DIAGNOSIS — D899 Disorder involving the immune mechanism, unspecified: Principal | ICD-10-CM

## 2019-06-16 DIAGNOSIS — Z79899 Other long term (current) drug therapy: Principal | ICD-10-CM

## 2019-06-16 DIAGNOSIS — R7989 Other specified abnormal findings of blood chemistry: Principal | ICD-10-CM

## 2019-06-16 LAB — CBC W/ DIFFERENTIAL
BASOPHILS RELATIVE PERCENT: 0 %
EOSINOPHILS ABSOLUTE COUNT: 0 10*9/L
EOSINOPHILS RELATIVE PERCENT: 0 %
HEMATOCRIT: 28.1 % — ABNORMAL LOW
HEMOGLOBIN: 8.7 g/dL — ABNORMAL LOW
LYMPHOCYTES ABSOLUTE COUNT: 0.1 10*9/L — ABNORMAL LOW
LYMPHOCYTES RELATIVE PERCENT: 2 %
MEAN CORPUSCULAR HEMOGLOBIN CONC: 31 g/dL
MEAN CORPUSCULAR HEMOGLOBIN: 29.9 pg
MEAN CORPUSCULAR VOLUME: 96.6 fL
MONOCYTES ABSOLUTE COUNT: 0.5 10*9/L
NEUTROPHILS ABSOLUTE COUNT: 4.1 10*9/L
NEUTROPHILS RELATIVE PERCENT: 87 %
PLATELET COUNT: 273 10*9/L
RED BLOOD CELL COUNT: 2.91 10*12/L — ABNORMAL LOW
RED CELL DISTRIBUTION WIDTH: 15.8 % — ABNORMAL HIGH
WBC ADJUSTED: 4.7 10*9/L

## 2019-06-16 LAB — CBC W/ AUTO DIFF
BASOPHILS ABSOLUTE COUNT: 0 10*9/L (ref 0.0–0.1)
BASOPHILS RELATIVE PERCENT: 0.2 %
EOSINOPHILS ABSOLUTE COUNT: 0 10*9/L (ref 0.0–0.4)
EOSINOPHILS RELATIVE PERCENT: 0.5 %
HEMATOCRIT: 28.8 % — ABNORMAL LOW (ref 41.0–53.0)
HEMOGLOBIN: 9.2 g/dL — ABNORMAL LOW (ref 13.5–17.5)
LARGE UNSTAINED CELLS: 1 % (ref 0–4)
LYMPHOCYTES ABSOLUTE COUNT: 0.1 10*9/L — ABNORMAL LOW (ref 1.5–5.0)
LYMPHOCYTES RELATIVE PERCENT: 2 %
MEAN CORPUSCULAR HEMOGLOBIN CONC: 32 g/dL (ref 31.0–37.0)
MEAN CORPUSCULAR HEMOGLOBIN: 31.1 pg (ref 26.0–34.0)
MEAN CORPUSCULAR VOLUME: 97.1 fL (ref 80.0–100.0)
MEAN PLATELET VOLUME: 7.8 fL (ref 7.0–10.0)
MONOCYTES ABSOLUTE COUNT: 0.2 10*9/L (ref 0.2–0.8)
MONOCYTES RELATIVE PERCENT: 4.6 %
NEUTROPHILS ABSOLUTE COUNT: 4.6 10*9/L (ref 2.0–7.5)
NEUTROPHILS RELATIVE PERCENT: 92.2 %
PLATELET COUNT: 320 10*9/L (ref 150–440)
RED CELL DISTRIBUTION WIDTH: 17.4 % — ABNORMAL HIGH (ref 12.0–15.0)
WBC ADJUSTED: 5 10*9/L (ref 4.5–11.0)

## 2019-06-16 LAB — COMPREHENSIVE METABOLIC PANEL
ALBUMIN: 4 g/dL (ref 3.5–5.0)
ALKALINE PHOSPHATASE: 57 U/L (ref 38–126)
ALT (SGPT): 18 U/L (ref <50)
ANION GAP: 7 mmol/L (ref 7–15)
AST (SGOT): 15 U/L — ABNORMAL LOW (ref 19–55)
BILIRUBIN TOTAL: 0.3 mg/dL (ref 0.0–1.2)
BUN / CREAT RATIO: 17
CALCIUM: 9.6 mg/dL (ref 8.5–10.2)
CHLORIDE: 105 mmol/L (ref 98–107)
CO2: 24 mmol/L (ref 22.0–30.0)
CREATININE: 1.67 mg/dL — ABNORMAL HIGH (ref 0.70–1.30)
EGFR CKD-EPI AA MALE: 60 mL/min/{1.73_m2} (ref >=60)
EGFR CKD-EPI NON-AA MALE: 52 mL/min/{1.73_m2} — ABNORMAL LOW (ref >=60)
GLUCOSE RANDOM: 103 mg/dL — ABNORMAL HIGH (ref 70–99)
PROTEIN TOTAL: 6.6 g/dL (ref 6.5–8.3)
SODIUM: 136 mmol/L (ref 135–145)

## 2019-06-16 LAB — TACROLIMUS, TROUGH
Lab: 10
Lab: 5.4

## 2019-06-16 LAB — URINALYSIS
BACTERIA: NONE SEEN /HPF
BILIRUBIN UA: NEGATIVE
BLOOD UA: NEGATIVE
GLUCOSE UA: NEGATIVE
KETONES UA: NEGATIVE
NITRITE UA: NEGATIVE
PH UA: 6 (ref 5.0–9.0)
PROTEIN UA: NEGATIVE
SPECIFIC GRAVITY UA: 1.015 (ref 1.003–1.030)
SQUAMOUS EPITHELIAL: <1 /HPF (ref 0–5)
UROBILINOGEN UA: 0.2
WBC UA: 2 /HPF (ref <=2)

## 2019-06-16 LAB — PHOSPHORUS
Lab: 3.1
Phosphate:MCnc:Pt:Ser/Plas:Qn:: 3.1

## 2019-06-16 LAB — PLATELET COUNT: Platelets:NCnc:Pt:Bld:Qn:Automated count: 320

## 2019-06-16 LAB — CREATININE, URINE: Lab: 101.6

## 2019-06-16 LAB — KETONES UA: Ketones:MCnc:Pt:Urine:Qn:Test strip: NEGATIVE

## 2019-06-16 LAB — BASIC METABOLIC PANEL
BLOOD UREA NITROGEN: 93 mg/dL
CALCIUM: 9 mg/dL
CHLORIDE: 108 mmol/L
CO2: 22 mmol/L
CREATININE: 1.76 mg/dL — ABNORMAL HIGH
EGFR CKD-EPI AA MALE: 57 mL/min/{1.73_m2} — ABNORMAL LOW
EGFR CKD-EPI NON-AA MALE: 49 mL/min/{1.73_m2} — ABNORMAL LOW
SODIUM: 140 mmol/L

## 2019-06-16 LAB — MAGNESIUM
Lab: 1.8
Magnesium:MCnc:Pt:Ser/Plas:Qn:: 1.7

## 2019-06-16 LAB — GLUCOSE RANDOM
Glucose:MCnc:Pt:Ser/Plas:Qn:: 103 — ABNORMAL HIGH
Lab: 93

## 2019-06-16 LAB — EOSINOPHILS RELATIVE PERCENT: Lab: 0

## 2019-06-16 MED ORDER — HYDRALAZINE 100 MG TABLET
ORAL_TABLET | Freq: Two times a day (BID) | ORAL | 11 refills | 15.00000 days | Status: CP
Start: 2019-06-16 — End: 2020-06-10

## 2019-06-16 MED ORDER — AMLODIPINE 10 MG TABLET
ORAL_TABLET | Freq: Every day | ORAL | 3 refills | 90 days | Status: CP
Start: 2019-06-16 — End: ?

## 2019-06-16 MED ORDER — CLONIDINE HCL 0.1 MG TABLET
ORAL_TABLET | Freq: Two times a day (BID) | ORAL | 11 refills | 30.00000 days | Status: CP
Start: 2019-06-16 — End: 2020-06-15

## 2019-06-16 MED ORDER — ERGOCALCIFEROL (VITAMIN D2) 1,250 MCG (50,000 UNIT) CAPSULE
ORAL_CAPSULE | ORAL | 1 refills | 28.00000 days | Status: CP
Start: 2019-06-16 — End: 2020-06-15
  Filled 2019-06-17: qty 4, 28d supply, fill #0

## 2019-06-16 MED ORDER — PREDNISONE 5 MG TABLET
ORAL_TABLET | Freq: Every day | ORAL | 11 refills | 30 days | Status: CP
Start: 2019-06-16 — End: ?
  Filled 2019-06-23: qty 30, 30d supply, fill #0

## 2019-06-16 MED ORDER — DOCUSATE SODIUM 100 MG CAPSULE
ORAL_CAPSULE | Freq: Two times a day (BID) | ORAL | 11 refills | 50.00000 days | Status: CP | PRN
Start: 2019-06-16 — End: ?
  Filled 2019-06-17: qty 100, 50d supply, fill #0

## 2019-06-16 NOTE — Unmapped (Signed)
Urine was collected and sent to the lab.

## 2019-06-16 NOTE — Unmapped (Signed)
Transplant Coordinator, Clinic Visit   Pt seen today by transplant nephrology for follow up, reviewed medications and symptoms. Overall pt states he feels well.          06/16/19 0848   BP: 126/88   Pulse: 74   Temp: 36.6 ??C (97.9 ??F)   Weight: 67.6 kg (149 lb)   Height: 177.8 cm (5' 10)   PainSc: 0-No pain       Assessment  BP: 120s/80s at home, was having low BP 90/60 with lightheaded, decreased Hydralazine and Clonidine independently to BID from TID  Headache: occasional  Hand tremors: mild   Numbness/tingling: no  Fevers: no  Chills/sweats: no  Shortness of breath: no  Chest pain or pressure: no  Palpitations: no  Nausea/vomiting: no  Diarrhea/constipation: no  UTI symptoms: no  Swelling: no  Pain: pain has resolved, has numbness to groin area and around incision    Good appetite; reports adequate hydration.      Intake: 60-90oz per day    Any new medications? Started on Buspar 15mg  BID by PCP  Immunosuppressant last taken: 10am yesterday morning    Per H. Marina Goodell NP: start Ergocalciferol 50,000units PO weekly, decrease Prednisone 5mg  daily, increase Envarsus to 7mg  daily, decrease Clonidine 0.1mg  BID, decrease Hydralazine 100mg  BID. Pt also needs survellience biopsy due to creatinine plateau, pt scheduled for Friday 06/19/2019. Pt will also hold Flomax at this time.     I spent a total of 30 minutes with Hanford L Heidelberg reviewing medications and symptoms.

## 2019-06-16 NOTE — Unmapped (Signed)
[facility] PHARMACY NOTE  06/15/2019   Ryan Moon  [id number]    Medication changes today:   1. Decrease clonidine to 0.1 mg BID  2. Increase Envarsus to 7 mg daily  3. Decrease prednisone to 5 mg daily  4. Start ergocalciferol 50,000 units weekly then transition to cholecalciferol 2,000 units daily    Education/Adherence tools provided today:  1.provided updated medication list  2. provided additional pill box education  3.  provided additional education on immunosuppression and transplant related medications including reviewing indications of medications, dosing and side effects    Follow up items:  1. goal of understanding indications and dosing of immunosuppression medications  2. Biopsy results    Next visit with pharmacy in 1 month  ____________________________________________________________________    Ryan Moon is a 34 y.o. 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  He presented to the ED 1 day transplant hospitalization discharge for abdominal pain and dysuria.  Renal U/S showed interval decrease perinephric fluid collection but a large post void residual.  He was started on Flomax and a foley catheter was placed that has since been removed.    Interval history: ED visit for pain.  He was treated for E faecalis UTI with doxycycline    Seen by pharmacy today for: medication management and pill box fill and adherence education; last seen by pharmacy 1 months ago     CC:  Patient complains of  occasional tremor    There were no vitals filed for this visit.    Allergies   Allergen Reactions   ??? Acetaminophen Nausea And Vomiting       All medications reviewed and updated.     Medication list includes revisions made during today???s encounter    Outpatient Encounter Medications as of 06/16/2019   Medication Sig Dispense Refill   ??? acetaminophen (TYLENOL EXTRA STRENGTH) 500 MG tablet Take 2 tablets (1,000 mg total) by mouth every six (6) hours as needed for pain or fever (> 38C). 100 tablet 0   ??? ALPRAZolam (XANAX) 0.25 MG tablet Take 0.5 mg by mouth every twelve (12) hours.      ??? amLODIPine (NORVASC) 10 MG tablet Take 10 mg by mouth daily.     ??? aspirin (ECOTRIN) 81 MG tablet Take 1 tablet (81 mg total) by mouth daily. 30 tablet 11   ??? cloNIDine HCL (CATAPRES) 0.2 MG tablet Take 1 tablet (0.2 mg total) by mouth Three (3) times a day. 90 tablet 11   ??? docusate sodium (COLACE) 100 MG capsule Take 1 capsule (100 mg total) by mouth two (2) times a day as needed for constipation. 60 capsule 0   ??? [EXPIRED] doxycycline (VIBRAMYCIN) 100 MG capsule Take 1 capsule (100 mg total) by mouth Two (2) times a day for 10 days. 20 capsule 0   ??? escitalopram oxalate (LEXAPRO) 10 MG tablet Take 15 mg by mouth.     ??? gabapentin (NEURONTIN) 100 MG capsule Take 3 capsules (300 mg total) by mouth Three (3) times a day. 270 capsule 0   ??? hydrALAZINE (APRESOLINE) 100 MG tablet Take 1 tablet (100 mg total) by mouth Three (3) times a day. 90 tablet 11   ??? magnesium oxide-Mg  AA chelate (MAGNESIUM, AMINO ACID CHELATE,) 133 mg Tab Take 1 tablet by mouth Two (2) times a day. 60 tablet 11   ??? mycophenolate (MYFORTIC) 180 MG EC tablet Take 3 tablets (540 mg total) by mouth Two (2) times a day. 180 tablet 11   ??? omeprazole (PRILOSEC) 20 MG capsule Take 20 mg by mouth two (2) times a day.      ??? [EXPIRED] oxyCODONE (ROXICODONE) 5 MG immediate release tablet Take 1 tablet (5 mg total) by mouth every six (6) hours as needed for up to 5 days. 20 tablet 0   ??? [EXPIRED] polyethylene glycol (GLYCOLAX) 17 gram/dose powder Dissolve 1 capful (17g) in 8 oz of juice or water daily as needed 510 g 0   ??? predniSONE (DELTASONE) 10 MG tablet Take 10 mg by mouth daily.   3   ??? tacrolimus (ENVARSUS XR) 1 mg Tb24 extended release tablet Take 1 tablet (1 mg) by mouth daily along with two 4 mg tablets for total dose of 9 mg daily 30 tablet 11   ??? tacrolimus (ENVARSUS XR) 4 mg Tb24 extended release tablet Take 2 tablets (8 mg) tablets once daily in addition to one 1 mg tablet for total daily dose of 9 mg daily. 60 tablet 11   ??? [EXPIRED] tamsulosin (FLOMAX) 0.4 mg capsule Take 1 capsule (0.4 mg total) by mouth daily. 30 capsule 0   ??? [EXPIRED] traMADoL (ULTRAM) 50 mg tablet Take 1 tablet (50 mg total) by mouth every eight (8) hours as needed for pain for up to 4 days. 12 tablet 0   ??? valGANciclovir (VALCYTE) 450 mg tablet Take 1 tablet (450 mg total) by mouth daily. 30 tablet 2     No facility-administered encounter medications on file as of 06/16/2019.      Induction agent : alemtuzumab    CURRENT IMMUNOSUPPRESSION: Envarsus 6 mg daily (had not been taking 9 mg as prescribed)  prograf/Envarsus/cyclosporine goal: 8-10   myfortic540  mg PO bid    prednisone 10 mg daily (pre transplant dose for ANCA vasculitis)    Patient is tolerating immunosuppression well    IMMUNOSUPPRESSION DRUG LEVELS:  Lab Results   Component Value Date    Tacrolimus, Trough 4.5 (L) 05/26/2019    Tacrolimus, Trough 5.7 05/26/2019    Tacrolimus, Trough 8.6 05/11/2019    Tacrolimus, Timed 5.9 06/11/2019    Tacrolimus, Timed 10.2 06/01/2019    Tacrolimus, Timed 3.4 05/25/2019     No results found for: CYCLO  No results found for: EVEROLIMUS  No results found for: SIROLIMUS    Envarsus level is accurate 24h level    Graft function: stable  DSA: ntd  Biopsies to date: zero hour biopsy with minimal arteriosclerosis  UPC: 0.207 on 12/7  WBC/ANC:  wnl    Plan: Will  increase Envarsus to 7 mg and decrease prednisone to 5 mg daily. Continue to monitor.    OI Prophylaxis:   CMV Status: D+/ R+, moderate risk . CMV prophylaxis: valganciclovir 450 mg daily x 3 months per protocol.  Estimated Creatinine Clearance: 60.4 mL/min (A) (based on SCr of 1.59 mg/dL (H)).  No results found for: CMVCP  PCP Prophylaxis: bactrim SS 1 tab MWF x 6 months.  Thrush: completed in hospital  Patient is  tolerating infectious prophylaxis well    Plan: Continue per protocol. Continue to monitor.    CV Prophylaxis: asa 81 mg   The ASCVD Risk score Denman George DC Montez Hageman, et al., 2013) failed  to calculate.  Statin therapy: Not indicated; currently on no statin  Plan:  Continue to monitor     BP: Goal < 140/90. Clinic vitals reported above.  Reports self adjusting hydralazine and clonidine to BID given low BP readings midday (90s systolic)  Home BP ranges: 110-130/70s  Current meds include: amlodipine 10 mg daily, hydralazine 100 mg BID, clonidine 0.2 mg BID   Plan: within goal. Decrease clonidine to 0.1 mg BID.  Instructed patient not to make med adjustments without contacting coordinator. Continue to monitor    Anemia of CKD:  H/H:   Lab Results   Component Value Date    HGB 9.1 (L) 06/11/2019     Lab Results   Component Value Date    HCT 26.6 (L) 06/11/2019     Iron panel:  No results found for: IRON, TIBC, FERRITIN  No results found for: LABIRON    Prior ESA use: none post transplant    Plan: within goal. Continue to monitor.     DM:   Lab Results   Component Value Date    A1C <4.0 (L) 04/16/2019   . Goal A1c < 7  History of Dm? No  Diet:did not address  Exercise:not yet  Plan:  Continue to monitor     Fluid intake: 60-90 oz daily    Electrolytes: wnl  Meds currently on: mg plus protein to 133 mg BID  Plan:Continue to monitor     GI/BM: pt reports no diarrhea; has normal BM; has occasional reflux  Meds currently on: docusate PRN, Miralax PRN (not using), omeprazole 20 mg BID (had been on once daily PPI but had breakthrough reflux on once daily)  Plan: Continue to monitor    Pain: pt reports pain has improved and than inguinal pain transitioned to numbness  Meds currently on: APAP PRN, gabapentin 300 mg TID  Plan:. Continue to monitor    Bone health:   Vitamin D Level: 14.6 on 06/11/19. Goal > 30.   Last DEXA results:  none available  Current meds include: none  Plan: Vitamin D level  out of goal; start ergocalciferol 50,000 units weekly x8 weeks then transition to 2,000 units daily. Continue to monitor.     Women's/Men's Health:  Ryan Moon is a 34 y.o. male. Patient reports no men's/women's health issues  Plan: Continue to monitor    Urinary retention  Meds currently on: tamsulosin 0.4 mg HS  Plan:  Will see how he does off of tamsulosin    Anxiety  Meds currently on: escitalopram 15 mg daily, alprazolam 0.5 mg BID PRN   **Reports smoking vs edible marijuana daily  Plan: Warned against use of marijuana given infection concerns + DDI with tacrolimus. Approved use of buspar 15 mg BID prescribed by PCP    Pharmacy preference:  [facility]    Adherence: Patient has poor understanding of medications; was not able to independently identify names/doses of immunosuppressants and OI meds.  Patient  does fill their own pill box on a regular basis at home.  His wife also helps with meds but she did not assist with this wee's pill box fill  Patient brought medication card:no  Pill box:did not bring - had Envarsus 15 mg in pill box because he followed instructions on med label rather than med list  Plan: Instructed patient that he shouldn't make med changes without contacting [facility] and that any new meds from other providers need to be approved by transplant team.  Provided extensive adherence  counseling/intervention    Spent approximately 40 minutes on educating this patient and greater than 50% was spent in direct face to face counseling regarding post transplant medication education. Questions and concerns were address to patient's satisfaction.    Patient was reviewed with Darolyn Rua, AGNP/Dr. Toni Arthurs who was agreement with the stated plan:     During this visit, the following was completed:   BG log data assessment  BP log data assessment  Labs ordered and evaluated  complex treatment plan >1 DS Patient education was completed for 11-24 minutes     All questions/concerns were addressed to the patient's satisfaction.  __________________________________________  Cleone Slim, PHARMD, CPP  [facility] TRANSPLANT CLINICAL PHARMACIST PRACTITIONER  PAGER [phone]

## 2019-06-17 DIAGNOSIS — Z940 Kidney transplant status: Principal | ICD-10-CM

## 2019-06-17 LAB — TACROLIMUS, TROUGH: Lab: 1.6 — ABNORMAL LOW

## 2019-06-17 LAB — TACROLIMUS BLOOD: Lab: 7.9

## 2019-06-17 MED ORDER — TACROLIMUS ER 4 MG TABLET,EXTENDED RELEASE 24 HR
ORAL_TABLET | Freq: Every day | ORAL | 11 refills | 30.00000 days | Status: CP
Start: 2019-06-17 — End: ?

## 2019-06-17 MED ORDER — GABAPENTIN 100 MG CAPSULE
ORAL_CAPSULE | Freq: Three times a day (TID) | ORAL | 1 refills | 30.00000 days | Status: CP
Start: 2019-06-17 — End: 2019-07-17
  Filled 2019-06-30: qty 270, 30d supply, fill #0

## 2019-06-17 MED ORDER — TACROLIMUS ER 1 MG TABLET,EXTENDED RELEASE 24 HR
ORAL_TABLET | Freq: Every day | ORAL | 11 refills | 30.00000 days | Status: CP
Start: 2019-06-17 — End: ?
  Filled 2019-06-23: qty 90, 30d supply, fill #0

## 2019-06-17 MED FILL — ERGOCALCIFEROL (VITAMIN D2) 1,250 MCG (50,000 UNIT) CAPSULE: 28 days supply | Qty: 4 | Fill #0 | Status: AC

## 2019-06-17 MED FILL — DOK 100 MG CAPSULE: 50 days supply | Qty: 100 | Fill #0 | Status: AC

## 2019-06-17 NOTE — Unmapped (Signed)
Kidney Transplant Biopsy Note    Date of Biopsy Referral: 06/17/19    Referring Transplant Provider: Dr. Elwyn Lade  Pager: [phone]  Phone:     Kidney Transplant Coordinator: Thayer Headings RN    Biopsy Fellow  _____________________ at _______________________ otherwise contact referring provider.     (NAME)   (CONTACT - pager/cell phone #)    ---------------------------------------------------------------------------------------------------------------------  Lamar Benes?: Yes  Patient Name: Ryan Moon  MR: [id number]  Age: 34 y.o.  Sex: Male Race: White or Caucasian Ethnicity: Not Hispanic or Latino   DOB:1985/08/12    Procedures: Ultrasound Guided Percutaneous Kidney Biopsy under Moderate Sedation  Tissue Submitted: Kidney  Special Studies Required: LM, IF, EM  ----------------------------------------------------------------------------------------------------------------------  Date of allograft implantation: 04/28/2019  ABO Incompatible: No  Underlying native kidney disease: ANCA positive vasculitis  Was the diagnosis established by biopsy?   Previous transplant biopsies: No  If yes, what were the previous diagnoses?   Previous kidney transplants: No If yes, this is #:     Donor Information (if available)  Age of donor:   Gender: Male  Race:   Type of donor: Living unrelated  Ischemia time:   Delayed graft function: No  If yes, how many days on dialysis:     History/Clinical Diagnosis/Indication for Biopsy: surveillance for creatinine plateau     ----------------------------------------------------------------------------------------------------------------------  Current Baseline Immunosuppression: prednisone, tacrolimus (Prograf or Envarsus) and mycophenolate (Cellcept or Myfortic) (please select ALL drugs used)  Specific anti-rejection treatment WITHIN 2 WEEKS before biopsy: No  If yes, what was the type of treatment? None  Patient off immunosuppression?: No Patient seems adherent to immunosuppression? Yes  Patient is currently back on dialysis? No  Has patient had any prior episodes of rejection? No   If yes, what was the treatment? None  ----------------------------------------------------------------------------------------------------------------------  Donor Specific Antibody Results:  Lab Results   Component Value Date    Donor ID [id number] 05/26/2019    Donor HLA-A Antigen #1 A2 05/26/2019    Anti-Donor HLA-A #1 MFI 19 05/26/2019    Donor HLA-A Antigen #2 A29 05/26/2019    Donor HLA-B Antigen #1 B8 05/26/2019    Anti-Donor HLA-B #1 MFI 0 05/26/2019    Donor HLA-B Antigen #2 B44 05/26/2019    Anti-Donor HLA-B #2 MFI 0 05/26/2019    Donor HLA-C Antigen #1 C7 05/26/2019    Anti-Donor HLA-C #1 MFI 0 05/26/2019    Donor HLA-C Antigen #2 C16 05/26/2019    Anti-Donor HLA-C #2 MFI 0 05/26/2019    Donor HLA-DR Antigen #1 DR17 05/26/2019    Anti-Donor HLA-DR #1 MFI 26 05/26/2019    Donor HLA-DR Antigen #2 DR7 05/26/2019    Anti-Donor HLA-DR #2 MFI 86 05/26/2019    Donor DRw Antigen #1 DR52 05/26/2019    Anti-Donor DRw #1 MFI 50 05/26/2019    Donor HLA-DQB Antigen #1 DQ2 05/26/2019    Anti-Donor HLA-DQB #1 MFI 0 05/26/2019    Donor HLA-DQB Antigen #2 DQ2 05/26/2019    Anti-Donor HLA-DQB #2 MFI 43 05/26/2019    DSA Comment  05/26/2019      Comment:      The HLA antigens listed are donor antigens and the corresponding MFI value.  MFI values >= 1000 are considered positive.  A negative result does not exclude the presence of low level DSA.  Blank donor antigen and MFI fields indicate unavailable donor HLA   type or lack of appropriate single antigen bead to determine DSA.  As such, the presence or  absence of DSA to the locus cannot be confirmed. Donor specific antibody (DSA) testing is performed with a  solid phase multiplex single antigen bead array method.  All procedures and reagents have been validated and performance characteristics determined by the [facility].    Certain of these tests have not been cleared/approved by the U.S. Food and Drug Administration (FDA).  The FDA has determined that such approval/clearance is not necessary because this laboratory is certified under the Clinical Laboratory Improvements   Amendments to perform high complexity testing.  This test is used for clinical purposes.  It should not be regarded as investigational or for research.  All HLA typings are performed using molecular methodologies.  Details of test procedures may be   obtained by calling [phone].  HLA-A, B, C, DR, and DQ results are serological equivalents of the molecular type.       Blood Pressure (mmHg):   BP Readings from Last 3 Encounters:   06/16/19 126/88   06/16/19 126/88   06/11/19 133/76     On Anti-hypertensive Therapy: Yes    Urinalysis:  Lab Results   Component Value Date    Color, UA Light Yellow 06/16/2019    Specific Gravity, UA 1.015 06/16/2019    pH, UA 6.0 06/16/2019    Glucose, UA Negative 06/16/2019    Ketones, UA Negative 06/16/2019    Blood, UA Negative 06/16/2019    Nitrite, UA Negative 06/16/2019    Leukocyte Esterase, UA Negative 06/16/2019    Urobilinogen, UA 0.2 mg/dL 45/40/9811    Bilirubin, UA Negative 06/16/2019     Urine protein/creatinine ratio:   Lab Results   Component Value Date    Protein/Creatinine Ratio, Urine 0.155 06/16/2019       Creatinine (present peak): 2.29  Creatinine (baseline): 1.4-1.7  Lab Results   Component Value Date    CREATININE 1.67 (H) 06/16/2019    CREATININE 1.76 (H) 06/15/2019    CREATININE 1.59 (H) 06/11/2019    CREATININE 1.58 (H) 06/09/2019    CREATININE 1.46 (H) 06/08/2019       Glucose:   Lab Results   Component Value Date    Glucose 103 (H) 06/16/2019     HGBA1C: Lab Results   Component Value Date    Hemoglobin A1C <4.0 (L) 04/16/2019     ----------------------------------------------------------------------------------------------------------------------  Clinical signs of infections at time of current biopsy:  BK:  BK blood viral load: No results found for: BBKQR, BBKCP, BBK10, BBKC    Urine decoy cells present: No    CMV: No   CMV viral load:     EBV:  EBV viral load: No results found for: EBVIU, EBVLOG    HSV: No  Hepatitis B: No  Hepatitis C: No  Bacteria: No  Fungi: No  Urinary tract infection: No  Other:   ----------------------------------------------------------------------------------------------------------------------  Stenosis of renal artery: No  Obstruction of ureter: No  Lymphocele: No

## 2019-06-17 NOTE — Unmapped (Signed)
Change in Envarsus dosage decrease. Refill too soon until 06/18/2019. Will attempt test claim on 06/18/2019 date.

## 2019-06-17 NOTE — Unmapped (Signed)
[facility] Specialty Medication Onboarding    Specialty Medication: ENVARSUS XR 1MG  TABLETS  Prior Authorization: Not Required   Financial Assistance: Yes - copay card approved as secondary   Final Copay/Day Supply: $0 / 30 DAYS    Insurance Restrictions: None     Notes to Pharmacist:     The triage team has completed the benefits investigation and has determined that the patient is able to fill this medication at [facility]. Please contact the patient to complete the onboarding or follow up with the prescribing physician as needed.

## 2019-06-17 NOTE — Unmapped (Signed)
Per test claim for Envarsus at the [facility], patient needs Medication Assistance Program for High Copay.

## 2019-06-18 NOTE — Unmapped (Signed)
Change in Envarsus dosage decrease. Co-pay $0.00.

## 2019-06-18 NOTE — Unmapped (Signed)
Called pt to review instructions for biopsy tomorrow, could not remember talking to [employer]. Pt has been educated regarding being NPO at midnight the night before biopsy, stopped Aspirin as of 06/16/2019 , will hold meds for labs, but bring them with him for after lab draw, he will have a driver over 18 and they will arrive no later than 0700 at [facility] Registration Desk. Pt reminded no children allowed in hospital at this time. Email request sent to [employer] as per policy.

## 2019-06-18 NOTE — Unmapped (Signed)
[facility] Refill Coordination Note    Specialty Medication(s) to be Shipped:   Transplant: Envarsus 1mg , Envarsus 4mg ,  mycophenolic acid 180mg , valgancyclovir 450mg  and Prednisone 5mg     Other medication(s) to be shipped: aspirin 81mg   gabapentin     Ryan Moon, DOB: 03/06/1986  Phone: [phone] (home)       All above HIPAA information was verified with patient.     Was a Nurse, learning disability used for this call? No    Completed refill call assessment today to schedule patient's medication shipment from the [facility] [phone]).       Specialty medication(s) and dose(s) confirmed: Patient reports changes to the regimen as follows: decrease overall in envarsus to 7mg  total daily, and prednisone down from 10mg  to 5mg  fdaily   Changes to medications: Ryan Moon reports no changes at this time.  Changes to insurance: No  Questions for the pharmacist: No    Confirmed patient received Welcome Packet with first shipment. The patient will receive a drug information handout for each medication shipped and additional FDA Medication Guides as required.       DISEASE/MEDICATION-SPECIFIC INFORMATION        N/A    SPECIALTY MEDICATION ADHERENCE     Medication Adherence    Patient reported X missed doses in the last month: 0  Specialty Medication: envarsus 1mg   Patient is on additional specialty medications: Yes  Additional Specialty Medications: envarsus 4mg   Patient Reported Additional Medication X Missed Doses in the Last Month: 0  Patient is on more than two specialty medications: Yes  Specialty Medication: mycophenolate 180mg   Patient Reported Additional Medication X Missed Doses in the Last Month: 0  Specialty Medication: prednisone 5mg   Patient Reported Additional Medication X Missed Doses in the Last Month: 0  Specialty Medication: valganciclovir 450mg   Patient Reported Additional Medication X Missed Doses in the Last Month: 0                envarsus 1mg   : 6 days of medicine on hand envarsus 4mg   : 7 days of medicine on hand   Prednisone 5mg   : 0 days of medicine on hand   valganciclovir 450mg   : 7 days of medicine on hand   Mycophenolate 180mg   : 7 days of medicine on hand       SHIPPING     Shipping address confirmed in Epic.     Delivery Scheduled: Yes, Expected medication delivery date: 1/19.     Medication will be delivered via Same Day Courier to the prescription address in Epic WAM.    Westley Gambles   [facility] Specialty Technician

## 2019-06-19 ENCOUNTER — Encounter: Admit: 2019-06-19 | Discharge: 2019-06-20 | Payer: PRIVATE HEALTH INSURANCE

## 2019-06-19 LAB — CHLORIDE: Chloride:SCnc:Pt:Ser/Plas:Qn:: 108 — ABNORMAL HIGH

## 2019-06-19 LAB — CBC W/ AUTO DIFF
BASOPHILS ABSOLUTE COUNT: 0 10*9/L (ref 0.0–0.1)
BASOPHILS ABSOLUTE COUNT: 0 10*9/L (ref 0.0–0.1)
BASOPHILS RELATIVE PERCENT: 0.1 %
BASOPHILS RELATIVE PERCENT: 0.3 %
EOSINOPHILS ABSOLUTE COUNT: 0 10*9/L (ref 0.0–0.4)
EOSINOPHILS ABSOLUTE COUNT: 0.1 10*9/L (ref 0.0–0.4)
EOSINOPHILS RELATIVE PERCENT: 0.3 %
EOSINOPHILS RELATIVE PERCENT: 1.5 %
HEMATOCRIT: 27.1 % — ABNORMAL LOW (ref 41.0–53.0)
HEMOGLOBIN: 8.8 g/dL — ABNORMAL LOW (ref 13.5–17.5)
LARGE UNSTAINED CELLS: 1 % (ref 0–4)
LARGE UNSTAINED CELLS: 1 % (ref 0–4)
LYMPHOCYTES ABSOLUTE COUNT: 0.1 10*9/L — ABNORMAL LOW (ref 1.5–5.0)
LYMPHOCYTES ABSOLUTE COUNT: 0.1 10*9/L — ABNORMAL LOW (ref 1.5–5.0)
LYMPHOCYTES RELATIVE PERCENT: 1.4 %
LYMPHOCYTES RELATIVE PERCENT: 1.9 %
MEAN CORPUSCULAR HEMOGLOBIN CONC: 31.4 g/dL (ref 31.0–37.0)
MEAN CORPUSCULAR HEMOGLOBIN: 30.6 pg (ref 26.0–34.0)
MEAN CORPUSCULAR HEMOGLOBIN: 32.1 pg (ref 26.0–34.0)
MEAN CORPUSCULAR VOLUME: 97.5 fL (ref 80.0–100.0)
MEAN CORPUSCULAR VOLUME: 97.3 fL (ref 80.0–100.0)
MEAN PLATELET VOLUME: 7.6 fL (ref 7.0–10.0)
MEAN PLATELET VOLUME: 8.1 fL (ref 7.0–10.0)
MONOCYTES ABSOLUTE COUNT: 0.4 10*9/L (ref 0.2–0.8)
MONOCYTES RELATIVE PERCENT: 5.1 %
MONOCYTES RELATIVE PERCENT: 7.1 %
NEUTROPHILS ABSOLUTE COUNT: 5.1 10*9/L (ref 2.0–7.5)
NEUTROPHILS ABSOLUTE COUNT: 4.3 10*9/L (ref 2.0–7.5)
NEUTROPHILS RELATIVE PERCENT: 92.5 %
NEUTROPHILS RELATIVE PERCENT: 88.4 %
PLATELET COUNT: 275 10*9/L (ref 150–440)
PLATELET COUNT: 271 10*9/L (ref 150–440)
RED BLOOD CELL COUNT: 2.74 10*12/L — ABNORMAL LOW (ref 4.50–5.90)
RED CELL DISTRIBUTION WIDTH: 17.7 % — ABNORMAL HIGH (ref 12.0–15.0)
RED CELL DISTRIBUTION WIDTH: 17.5 % — ABNORMAL HIGH (ref 12.0–15.0)
WBC ADJUSTED: 5.5 10*9/L (ref 4.5–11.0)
WBC ADJUSTED: 4.9 10*9/L (ref 4.5–11.0)

## 2019-06-19 LAB — HEMOGLOBIN: Hemoglobin:MCnc:Pt:Bld:Qn:: 8.8 — ABNORMAL LOW

## 2019-06-19 LAB — BASIC METABOLIC PANEL
ANION GAP: 5 mmol/L — ABNORMAL LOW (ref 7–15)
BLOOD UREA NITROGEN: 39 mg/dL — ABNORMAL HIGH (ref 7–21)
BUN / CREAT RATIO: 21
CALCIUM: 8.9 mg/dL (ref 8.5–10.2)
CHLORIDE: 108 mmol/L — ABNORMAL HIGH (ref 98–107)
CREATININE: 1.82 mg/dL — ABNORMAL HIGH (ref 0.70–1.30)
EGFR CKD-EPI AA MALE: 54 mL/min/{1.73_m2} — ABNORMAL LOW (ref >=60)
EGFR CKD-EPI NON-AA MALE: 47 mL/min/{1.73_m2} — ABNORMAL LOW (ref >=60)
GLUCOSE RANDOM: 79 mg/dL (ref 70–99)
POTASSIUM: 4.2 mmol/L (ref 3.5–5.0)
SODIUM: 138 mmol/L (ref 135–145)

## 2019-06-19 LAB — PROTIME-INR: INR: 0.91

## 2019-06-19 LAB — NEUTROPHILS ABSOLUTE COUNT: Neutrophils:NCnc:Pt:Bld:Qn:Automated count: 5.1

## 2019-06-19 LAB — APTT
Coagulation surface induced:Time:Pt:PPP:Qn:Coag: 26.4
HEPARIN CORRELATION: <0.2

## 2019-06-19 LAB — INR: Coagulation tissue factor induced.INR:RelTime:Pt:PPP:Qn:Coag: 0.91

## 2019-06-19 NOTE — Unmapped (Signed)
KIDNEY BIOPSY PROCEDURE NOTE    INDICATIONS:  Elevated creatinine in a renal transplant    CONSENT/TIME OUT:    Risks, benefits and alternatives including blood loss requiring transfusion, loss of kidney or kidney function, and death were discussed with patient.  Written informed consent was obtained prior to the procedure and is detailed in the medical record.  Prior to the start of the procedure, a time out was taken and the identity of the patient was confirmed via name, medical record number and date of birth.  The availability of the correct equipment was verified.    PROCEDURE:  Name: Transplant Kidney Biopsy  Description: Ultrasound-guided transplant kidney biopsy    Pre-Procedure blood specimens were sent for CBC, PT/aPTT, and type & screen.     The patient was given 2 mg of midazolam and 50 mcg of fentanyl during the procedure, and 15 mL lidocaine 1% were used for local anesthesia. Under ultrasound guidance, a 16cm 16G biopsy needle was inserted into the right-sided transplanted kidney for a total of 2 passes with 2 core specimens obtained for analysis.      COMPLICATIONS:   Complications: small bleeding (no hematoma) visualized, no longer visualized after 5 minutes of pressure; no further bleeding visualized at the completion of the procedure    The patient will be closely watched in the recovery area, kept flat for 6 hours while wearing an abdominal binder, and will have a repeat CBC and ultrasound in 4 hours to insure no hemodynamically significant bleeding post-biopsy.    SPECIMEN(S):  Core #1: 1.1 x 0.1 x 0.1 (EM 0.2, LM 0.9)  Core #2: 1.3 x 0.1 x 0.1 (IF 0.3, LM 1.0)

## 2019-06-19 NOTE — Unmapped (Signed)
Ryan Moon

## 2019-06-19 NOTE — Unmapped (Signed)
Assessment/Plan:    [name] is a 34 y.o. male who will undergo Ultrasound-guided transplant kidney biopsy    1. Indications and risks/benefits of procedure reviewed with patient.    2. Consent signed and present on patient's charge.   3. No cardiopulmonary or other medical contraindications present therefore will proceed with biopsy.       CC: No chief complaint on file.      HPI: [name] is a 35 y.o. male who will undergo Ultrasound-guided transplant kidney biopsy with moderate sedation.    Allergies:   Allergies   Allergen Reactions   ??? Acetaminophen Nausea And Vomiting       Medications:   Current Outpatient Medications   Medication Sig Dispense Refill   ??? ALPRAZolam (XANAX) 0.25 MG tablet Take 0.5 mg by mouth every twelve (12) hours.      ??? amLODIPine (NORVASC) 10 MG tablet Take 1 tablet (10 mg total) by mouth daily. 90 tablet 3   ??? aspirin (ECOTRIN) 81 MG tablet Take 1 tablet (81 mg total) by mouth daily. 30 tablet 11   ??? busPIRone (BUSPAR) 15 MG tablet Take 15 mg by mouth two (2) times a day.     ??? docusate sodium (COLACE) 100 MG capsule Take 1 capsule (100 mg total) by mouth two (2) times a day as needed for constipation. 100 capsule 11   ??? escitalopram oxalate (LEXAPRO) 10 MG tablet Take 15 mg by mouth.     ??? gabapentin (NEURONTIN) 100 MG capsule Take 3 capsules (300 mg total) by mouth Three (3) times a day. 270 capsule 1   ??? hydrALAZINE (APRESOLINE) 100 MG tablet Take 1 tablet (100 mg total) by mouth Two (2) times a day. 30 tablet 11   ??? magnesium oxide-Mg AA chelate (MAGNESIUM, AMINO ACID CHELATE,) 133 mg Tab Take 1 tablet by mouth Two (2) times a day. 60 tablet 11   ??? mycophenolate (MYFORTIC) 180 MG EC tablet Take 3 tablets (540 mg total) by mouth Two (2) times a day. 180 tablet 11   ??? omeprazole (PRILOSEC) 20 MG capsule Take 20 mg by mouth two (2) times a day.      ??? predniSONE (DELTASONE) 5 MG tablet Take 1 tablet (5 mg total) by mouth daily. 30 tablet 11 ??? tacrolimus (ENVARSUS XR) 1 mg Tb24 extended release tablet Take 3 tablets (3 mg total) by mouth daily with 1 (4 mg) tablet for total dose of 7 mg daily. 90 tablet 11   ??? tacrolimus (ENVARSUS XR) 4 mg Tb24 extended release tablet Take 1 tablet (4 mg total) by mouth daily with 3 (1 mg) tablets for total dose of 7 mg daily. 30 tablet 11   ??? valGANciclovir (VALCYTE) 450 mg tablet Take 1 tablet (450 mg total) by mouth daily. 30 tablet 2   ??? acetaminophen (TYLENOL EXTRA STRENGTH) 500 MG tablet Take 2 tablets (1,000 mg total) by mouth every six (6) hours as needed for pain or fever (> 38C). 100 tablet 0   ??? cloNIDine HCL (CATAPRES) 0.1 MG tablet Take 1 tablet (0.1 mg total) by mouth Two (2) times a day. 60 tablet 11   ??? ergocalciferol (DRISDOL) 1,250 mcg (50,000 unit) capsule Take 1 capsule (50,000 Units total) by mouth once a week. (Patient not taking: Reported on 06/19/2019) 4 capsule 1     No current facility-administered medications for this encounter.        PMH:   Past Medical History:   Diagnosis Date   ???  Anemia    ??? Chronic kidney disease    ??? HTN (hypertension)    ??? Renal vasculitis (CMS-HCC)    ??? Secondary hyperparathyroidism (CMS-HCC)        ASA Grade: ASA 2 - Patient with mild systemic disease with no functional limitations    ROS:  General: Denies fever or chills.  Cardiovascular: Denies chest pain.   Pulmonary: Denies shortness of breath, snoring, sleep apnea, or respiratory infection.    Allergies:   Allergies   Allergen Reactions   ??? Acetaminophen Nausea And Vomiting           PE:    Vitals:    06/19/19 0810   BP: 114/63   Pulse: 63   Resp: 16   Temp: 36.9 ??C   SpO2: 98%     General:  pleasant male in NAD.  Airway assessment: Class 2 - Can visualize soft palate and fauces, tip of uvula is obscured  Cardiovascular:  Regular rate and rhythm.  No murmurs, gallops or rubs.   Lungs: respirations nonlabored; clear to auscultation bilaterally

## 2019-06-20 NOTE — Unmapped (Signed)
Received call from nephropathologist Dr. Ricke Hey about transplant biopsy results. No pathologic evidence of rejection or pathologic abnormality other than mild arteriosclerosis. Patient discharged home.

## 2019-06-22 ENCOUNTER — Encounter
Admit: 2019-06-22 | Discharge: 2019-06-22 | Payer: PRIVATE HEALTH INSURANCE | Attending: Nephrology | Primary: Nephrology

## 2019-06-22 ENCOUNTER — Other Ambulatory Visit
Admission: RE | Admit: 2019-06-22 | Discharge: 2019-06-22 | Disposition: A | Discharge status: Home / Self Care | Payer: BLUE CROSS/BLUE SHIELD | Source: Ambulatory Visit | Attending: Nephrology | Admitting: Nephrology

## 2019-06-22 DIAGNOSIS — E559 Vitamin D deficiency, unspecified: Secondary | ICD-10-CM | POA: Diagnosis not present

## 2019-06-22 DIAGNOSIS — D631 Anemia in chronic kidney disease: Secondary | ICD-10-CM | POA: Insufficient documentation

## 2019-06-22 DIAGNOSIS — D899 Disorder involving the immune mechanism, unspecified: Secondary | ICD-10-CM | POA: Diagnosis not present

## 2019-06-22 DIAGNOSIS — Z789 Other specified health status: Secondary | ICD-10-CM | POA: Insufficient documentation

## 2019-06-22 DIAGNOSIS — B259 Cytomegaloviral disease, unspecified: Secondary | ICD-10-CM | POA: Insufficient documentation

## 2019-06-22 DIAGNOSIS — Z79899 Other long term (current) drug therapy: Secondary | ICD-10-CM | POA: Diagnosis not present

## 2019-06-22 DIAGNOSIS — Z940 Kidney transplant status: Secondary | ICD-10-CM | POA: Insufficient documentation

## 2019-06-22 DIAGNOSIS — Z114 Encounter for screening for human immunodeficiency virus [HIV]: Secondary | ICD-10-CM | POA: Insufficient documentation

## 2019-06-22 DIAGNOSIS — Z9483 Pancreas transplant status: Secondary | ICD-10-CM | POA: Diagnosis not present

## 2019-06-22 DIAGNOSIS — E1129 Type 2 diabetes mellitus with other diabetic kidney complication: Secondary | ICD-10-CM | POA: Diagnosis not present

## 2019-06-22 DIAGNOSIS — Z09 Encounter for follow-up examination after completed treatment for conditions other than malignant neoplasm: Secondary | ICD-10-CM | POA: Insufficient documentation

## 2019-06-22 DIAGNOSIS — N390 Urinary tract infection, site not specified: Secondary | ICD-10-CM | POA: Insufficient documentation

## 2019-06-22 LAB — BASIC METABOLIC PANEL
Anion gap: 11 (ref 5–15)
BUN: 27 mg/dL — ABNORMAL HIGH (ref 6–20)
CO2: 22 mmol/L (ref 22–32)
Calcium: 9.4 mg/dL (ref 8.9–10.3)
Chloride: 106 mmol/L (ref 98–111)
Creatinine, Ser: 1.7 mg/dL — ABNORMAL HIGH (ref 0.61–1.24)
GFR calc Af Amer: 59 mL/min — ABNORMAL LOW (ref >60)
GFR calc non Af Amer: 51 mL/min — ABNORMAL LOW (ref >60)
Glucose, Bld: 95 mg/dL (ref 70–99)
Potassium: 3.8 mmol/L (ref 3.5–5.1)
Sodium: 139 mmol/L (ref 135–145)

## 2019-06-22 LAB — CBC WITH DIFFERENTIAL/PLATELET
Abs Immature Granulocytes: 0.17 10*3/uL — ABNORMAL HIGH (ref 0.00–0.07)
Basophils Absolute: 0 10*3/uL (ref 0.0–0.1)
Basophils Relative: 1 %
Eosinophils Absolute: 0.1 10*3/uL (ref 0.0–0.5)
Eosinophils Relative: 1 %
HCT: 30.5 % — ABNORMAL LOW (ref 39.0–52.0)
Hemoglobin: 9.9 g/dL — ABNORMAL LOW (ref 13.0–17.0)
Immature Granulocytes: 3 %
Lymphocytes Relative: 2 %
Lymphs Abs: 0.1 10*3/uL — ABNORMAL LOW (ref 0.7–4.0)
MCH: 31 pg (ref 26.0–34.0)
MCHC: 32.5 g/dL (ref 30.0–36.0)
MCV: 95.6 fL (ref 80.0–100.0)
Monocytes Absolute: 0.6 10*3/uL (ref 0.1–1.0)
Monocytes Relative: 9 %
Neutro Abs: 5.1 10*3/uL (ref 1.7–7.7)
Neutrophils Relative %: 84 %
Platelets: 228 10*3/uL (ref 150–400)
RBC: 3.19 MIL/uL — ABNORMAL LOW (ref 4.22–5.81)
RDW: 16.3 % — ABNORMAL HIGH (ref 11.5–15.5)
WBC: 5.9 10*3/uL (ref 4.0–10.5)
nRBC: 0 % (ref 0.0–0.2)

## 2019-06-22 LAB — PHOSPHORUS: Phosphorus: 2.8 mg/dL (ref 2.5–4.6)

## 2019-06-22 LAB — MAGNESIUM: Magnesium: 1.7 mg/dL (ref 1.7–2.4)

## 2019-06-23 LAB — POTASSIUM: Lab: 3.8

## 2019-06-23 LAB — CBC W/ DIFFERENTIAL
BASOPHILS ABSOLUTE COUNT: 0 10*9/L
BASOPHILS RELATIVE PERCENT: 1 %
EOSINOPHILS ABSOLUTE COUNT: 1 10*9/L
HEMATOCRIT: 30.5 % — ABNORMAL LOW
HEMOGLOBIN: 9.9 g/dL — ABNORMAL LOW
LYMPHOCYTES ABSOLUTE COUNT: 0.1 10*9/L — ABNORMAL LOW
LYMPHOCYTES RELATIVE PERCENT: 2 %
MEAN CORPUSCULAR HEMOGLOBIN CONC: 32.5 g/dL
MEAN CORPUSCULAR HEMOGLOBIN: 31 pg
MEAN CORPUSCULAR VOLUME: 95.6 fL
MONOCYTES ABSOLUTE COUNT: 0.6 10*9/L
MONOCYTES RELATIVE PERCENT: 9 %
NEUTROPHILS ABSOLUTE COUNT: 5.1 10*9/L
NEUTROPHILS RELATIVE PERCENT: 84 %
PLATELET COUNT: 228 10*9/L
RED CELL DISTRIBUTION WIDTH: 16.3 % — ABNORMAL HIGH
WHITE BLOOD CELL COUNT: 5.9 10*9/L

## 2019-06-23 LAB — BASIC METABOLIC PANEL
CALCIUM: 9.4 mg/dL
CHLORIDE: 106 mmol/L
CO2: 22 mmol/L
EGFR CKD-EPI AA MALE: 59 mL/min/{1.73_m2} — ABNORMAL LOW
EGFR CKD-EPI NON-AA MALE: 51 mL/min/{1.73_m2} — ABNORMAL LOW
GLUCOSE RANDOM: 95 mg/dL
POTASSIUM: 3.8 mmol/L
SODIUM: 139 mmol/L

## 2019-06-23 LAB — MAGNESIUM
Lab: 1.7
MAGNESIUM: 1.7 mg/dL

## 2019-06-23 LAB — PHOSPHORUS: Lab: 2.8

## 2019-06-23 LAB — MACROCYTES: Lab: 0

## 2019-06-23 MED FILL — ASPIRIN 81 MG TABLET,DELAYED RELEASE: ORAL | 30 days supply | Qty: 30 | Fill #1

## 2019-06-23 MED FILL — MYCOPHENOLATE SODIUM 180 MG TABLET,DELAYED RELEASE: ORAL | 30 days supply | Qty: 180 | Fill #1

## 2019-06-23 MED FILL — ENVARSUS XR 4 MG TABLET,EXTENDED RELEASE: 30 days supply | Qty: 30 | Fill #0 | Status: AC

## 2019-06-23 MED FILL — PREDNISONE 5 MG TABLET: 30 days supply | Qty: 30 | Fill #0 | Status: AC

## 2019-06-23 MED FILL — ASPIRIN 81 MG TABLET,DELAYED RELEASE: 30 days supply | Qty: 30 | Fill #1 | Status: AC

## 2019-06-23 MED FILL — VALGANCICLOVIR 450 MG TABLET: ORAL | 30 days supply | Qty: 30 | Fill #1

## 2019-06-23 MED FILL — MYCOPHENOLATE SODIUM 180 MG TABLET,DELAYED RELEASE: 30 days supply | Qty: 180 | Fill #1 | Status: AC

## 2019-06-23 MED FILL — VALGANCICLOVIR 450 MG TABLET: 30 days supply | Qty: 30 | Fill #1 | Status: AC

## 2019-06-23 MED FILL — ENVARSUS XR 1 MG TABLET,EXTENDED RELEASE: 30 days supply | Qty: 90 | Fill #0 | Status: AC

## 2019-06-23 MED FILL — ENVARSUS XR 4 MG TABLET,EXTENDED RELEASE: ORAL | 30 days supply | Qty: 30 | Fill #0

## 2019-06-25 DIAGNOSIS — Z940 Kidney transplant status: Principal | ICD-10-CM

## 2019-06-25 DIAGNOSIS — Z79899 Other long term (current) drug therapy: Principal | ICD-10-CM

## 2019-06-26 ENCOUNTER — Encounter
Admit: 2019-06-26 | Discharge: 2019-06-26 | Payer: PRIVATE HEALTH INSURANCE | Attending: Nephrology | Primary: Nephrology

## 2019-06-26 ENCOUNTER — Other Ambulatory Visit
Admission: RE | Admit: 2019-06-26 | Discharge: 2019-06-26 | Disposition: A | Discharge status: Home / Self Care | Payer: BLUE CROSS/BLUE SHIELD | Source: Ambulatory Visit | Attending: Nephrology | Admitting: Nephrology

## 2019-06-26 ENCOUNTER — Other Ambulatory Visit: Payer: Self-pay

## 2019-06-26 DIAGNOSIS — N390 Urinary tract infection, site not specified: Secondary | ICD-10-CM | POA: Insufficient documentation

## 2019-06-26 DIAGNOSIS — Z09 Encounter for follow-up examination after completed treatment for conditions other than malignant neoplasm: Secondary | ICD-10-CM | POA: Insufficient documentation

## 2019-06-26 DIAGNOSIS — E1129 Type 2 diabetes mellitus with other diabetic kidney complication: Secondary | ICD-10-CM | POA: Diagnosis not present

## 2019-06-26 DIAGNOSIS — D899 Disorder involving the immune mechanism, unspecified: Secondary | ICD-10-CM | POA: Diagnosis not present

## 2019-06-26 DIAGNOSIS — Z79899 Other long term (current) drug therapy: Secondary | ICD-10-CM | POA: Diagnosis not present

## 2019-06-26 DIAGNOSIS — E559 Vitamin D deficiency, unspecified: Secondary | ICD-10-CM | POA: Insufficient documentation

## 2019-06-26 DIAGNOSIS — Z114 Encounter for screening for human immunodeficiency virus [HIV]: Secondary | ICD-10-CM | POA: Insufficient documentation

## 2019-06-26 DIAGNOSIS — Z940 Kidney transplant status: Secondary | ICD-10-CM | POA: Insufficient documentation

## 2019-06-26 DIAGNOSIS — B259 Cytomegaloviral disease, unspecified: Secondary | ICD-10-CM | POA: Insufficient documentation

## 2019-06-26 DIAGNOSIS — Z9483 Pancreas transplant status: Secondary | ICD-10-CM | POA: Insufficient documentation

## 2019-06-26 DIAGNOSIS — Z789 Other specified health status: Secondary | ICD-10-CM | POA: Insufficient documentation

## 2019-06-26 DIAGNOSIS — D631 Anemia in chronic kidney disease: Secondary | ICD-10-CM | POA: Diagnosis not present

## 2019-06-26 LAB — BASIC METABOLIC PANEL
Anion gap: 9 (ref 5–15)
BUN: 33 mg/dL — ABNORMAL HIGH (ref 6–20)
CO2: 23 mmol/L (ref 22–32)
Calcium: 9.4 mg/dL (ref 8.9–10.3)
Chloride: 108 mmol/L (ref 98–111)
Creatinine, Ser: 1.65 mg/dL — ABNORMAL HIGH (ref 0.61–1.24)
GFR calc Af Amer: >60 mL/min (ref >60)
GFR calc non Af Amer: 53 mL/min — ABNORMAL LOW (ref >60)
Glucose, Bld: 96 mg/dL (ref 70–99)
Potassium: 4.1 mmol/L (ref 3.5–5.1)
Sodium: 140 mmol/L (ref 135–145)

## 2019-06-26 LAB — CBC WITH DIFFERENTIAL/PLATELET
Abs Immature Granulocytes: 0.07 10*3/uL (ref 0.00–0.07)
Basophils Absolute: 0 10*3/uL (ref 0.0–0.1)
Basophils Relative: 0 %
Eosinophils Absolute: 0 10*3/uL (ref 0.0–0.5)
Eosinophils Relative: 1 %
HCT: 30 % — ABNORMAL LOW (ref 39.0–52.0)
Hemoglobin: 9.4 g/dL — ABNORMAL LOW (ref 13.0–17.0)
Immature Granulocytes: 1 %
Lymphocytes Relative: 2 %
Lymphs Abs: 0.1 10*3/uL — ABNORMAL LOW (ref 0.7–4.0)
MCH: 31 pg (ref 26.0–34.0)
MCHC: 31.3 g/dL (ref 30.0–36.0)
MCV: 99 fL (ref 80.0–100.0)
Monocytes Absolute: 0.5 10*3/uL (ref 0.1–1.0)
Monocytes Relative: 8 %
Neutro Abs: 4.9 10*3/uL (ref 1.7–7.7)
Neutrophils Relative %: 88 %
Platelets: 216 10*3/uL (ref 150–400)
RBC: 3.03 MIL/uL — ABNORMAL LOW (ref 4.22–5.81)
RDW: 16.7 % — ABNORMAL HIGH (ref 11.5–15.5)
WBC: 5.6 10*3/uL (ref 4.0–10.5)
nRBC: 0 % (ref 0.0–0.2)

## 2019-06-26 LAB — MAGNESIUM: Magnesium: 1.7 mg/dL (ref 1.7–2.4)

## 2019-06-26 LAB — PHOSPHORUS: Phosphorus: 3.3 mg/dL (ref 2.5–4.6)

## 2019-06-26 LAB — TACROLIMUS, TROUGH: Lab: 2.8 — ABNORMAL LOW

## 2019-06-29 ENCOUNTER — Other Ambulatory Visit
Admission: RE | Admit: 2019-06-29 | Discharge: 2019-06-29 | Disposition: A | Discharge status: Home / Self Care | Payer: BLUE CROSS/BLUE SHIELD | Source: Ambulatory Visit | Attending: Nephrology | Admitting: Nephrology

## 2019-06-29 ENCOUNTER — Other Ambulatory Visit: Payer: Self-pay

## 2019-06-29 DIAGNOSIS — Z940 Kidney transplant status: Principal | ICD-10-CM

## 2019-06-29 DIAGNOSIS — Z79899 Other long term (current) drug therapy: Principal | ICD-10-CM

## 2019-06-29 DIAGNOSIS — B259 Cytomegaloviral disease, unspecified: Secondary | ICD-10-CM | POA: Diagnosis not present

## 2019-06-29 DIAGNOSIS — E1129 Type 2 diabetes mellitus with other diabetic kidney complication: Secondary | ICD-10-CM | POA: Insufficient documentation

## 2019-06-29 DIAGNOSIS — Z9483 Pancreas transplant status: Secondary | ICD-10-CM | POA: Diagnosis not present

## 2019-06-29 DIAGNOSIS — D631 Anemia in chronic kidney disease: Secondary | ICD-10-CM | POA: Diagnosis not present

## 2019-06-29 DIAGNOSIS — E559 Vitamin D deficiency, unspecified: Secondary | ICD-10-CM | POA: Diagnosis not present

## 2019-06-29 DIAGNOSIS — N189 Chronic kidney disease, unspecified: Secondary | ICD-10-CM | POA: Diagnosis not present

## 2019-06-29 DIAGNOSIS — D899 Disorder involving the immune mechanism, unspecified: Secondary | ICD-10-CM | POA: Diagnosis not present

## 2019-06-29 DIAGNOSIS — Z114 Encounter for screening for human immunodeficiency virus [HIV]: Secondary | ICD-10-CM | POA: Diagnosis not present

## 2019-06-29 DIAGNOSIS — Z789 Other specified health status: Secondary | ICD-10-CM | POA: Diagnosis not present

## 2019-06-29 DIAGNOSIS — N390 Urinary tract infection, site not specified: Secondary | ICD-10-CM | POA: Diagnosis not present

## 2019-06-29 DIAGNOSIS — E1122 Type 2 diabetes mellitus with diabetic chronic kidney disease: Secondary | ICD-10-CM | POA: Insufficient documentation

## 2019-06-29 LAB — CBC WITH DIFFERENTIAL/PLATELET
Abs Immature Granulocytes: 0.05 10*3/uL (ref 0.00–0.07)
Basophils Absolute: 0 10*3/uL (ref 0.0–0.1)
Basophils Relative: 1 %
Eosinophils Absolute: 0.1 10*3/uL (ref 0.0–0.5)
Eosinophils Relative: 1 %
HCT: 31.7 % — ABNORMAL LOW (ref 39.0–52.0)
Hemoglobin: 10.1 g/dL — ABNORMAL LOW (ref 13.0–17.0)
Immature Granulocytes: 1 %
Lymphocytes Relative: 3 %
Lymphs Abs: 0.1 10*3/uL — ABNORMAL LOW (ref 0.7–4.0)
MCH: 31.3 pg (ref 26.0–34.0)
MCHC: 31.9 g/dL (ref 30.0–36.0)
MCV: 98.1 fL (ref 80.0–100.0)
Monocytes Absolute: 0.4 10*3/uL (ref 0.1–1.0)
Monocytes Relative: 10 %
Neutro Abs: 3.5 10*3/uL (ref 1.7–7.7)
Neutrophils Relative %: 84 %
Platelets: 228 10*3/uL (ref 150–400)
RBC: 3.23 MIL/uL — ABNORMAL LOW (ref 4.22–5.81)
RDW: 16.6 % — ABNORMAL HIGH (ref 11.5–15.5)
WBC: 4.1 10*3/uL (ref 4.0–10.5)
nRBC: 0 % (ref 0.0–0.2)

## 2019-06-29 LAB — BASIC METABOLIC PANEL
Anion gap: 10 (ref 5–15)
BUN: 31 mg/dL — ABNORMAL HIGH (ref 6–20)
CO2: 23 mmol/L (ref 22–32)
Calcium: 9.3 mg/dL (ref 8.9–10.3)
Chloride: 108 mmol/L (ref 98–111)
Creatinine, Ser: 1.83 mg/dL — ABNORMAL HIGH (ref 0.61–1.24)
GFR calc Af Amer: 54 mL/min — ABNORMAL LOW (ref >60)
GFR calc non Af Amer: 47 mL/min — ABNORMAL LOW (ref >60)
Glucose, Bld: 101 mg/dL — ABNORMAL HIGH (ref 70–99)
Potassium: 3.9 mmol/L (ref 3.5–5.1)
Sodium: 141 mmol/L (ref 135–145)

## 2019-06-29 LAB — MAGNESIUM: Magnesium: 1.7 mg/dL (ref 1.7–2.4)

## 2019-06-29 LAB — PHOSPHORUS: Phosphorus: 3.6 mg/dL (ref 2.5–4.6)

## 2019-06-29 LAB — CBC W/ DIFFERENTIAL
BASOPHILS ABSOLUTE COUNT: 0 10*9/L
BASOPHILS RELATIVE PERCENT: 1 %
EOSINOPHILS ABSOLUTE COUNT: 0.1 10*9/L
EOSINOPHILS RELATIVE PERCENT: 1 %
HEMATOCRIT: 31.7 % — ABNORMAL LOW
HEMOGLOBIN: 10.1 g/dL — ABNORMAL LOW
LYMPHOCYTES ABSOLUTE COUNT: 0.1 10*9/L — ABNORMAL LOW
MEAN CORPUSCULAR HEMOGLOBIN CONC: 31.9 g/dL
MEAN CORPUSCULAR HEMOGLOBIN: 31.3 pg
MEAN CORPUSCULAR VOLUME: 98.1 fL
MONOCYTES ABSOLUTE COUNT: 0.4 10*9/L
MONOCYTES RELATIVE PERCENT: 10 %
NEUTROPHILS ABSOLUTE COUNT: 3.5 10*9/L
PLATELET COUNT: 228 10*9/L
RED BLOOD CELL COUNT: 3.23 10*12/L — ABNORMAL LOW
RED CELL DISTRIBUTION WIDTH: 16.6 % — ABNORMAL HIGH
WHITE BLOOD CELL COUNT: 4.1 10*9/L

## 2019-06-29 LAB — HYPERCHROMASIA: Lab: 0

## 2019-06-30 LAB — TACROLIMUS, TROUGH: Lab: 4.6 — ABNORMAL LOW

## 2019-06-30 MED FILL — GABAPENTIN 100 MG CAPSULE: 30 days supply | Qty: 270 | Fill #0 | Status: AC

## 2019-07-01 DIAGNOSIS — Z940 Kidney transplant status: Principal | ICD-10-CM

## 2019-07-01 DIAGNOSIS — Z79899 Other long term (current) drug therapy: Principal | ICD-10-CM

## 2019-07-01 LAB — TACROLIMUS LEVEL: Tacrolimus (FK506) - LabCorp: 12.8 ng/mL (ref 2.0–20.0)

## 2019-07-01 MED ORDER — TACROLIMUS ER 4 MG TABLET,EXTENDED RELEASE 24 HR
ORAL_TABLET | Freq: Every day | ORAL | 11 refills | 30.00000 days | Status: CP
Start: 2019-07-01 — End: 2020-06-30

## 2019-07-01 MED ORDER — SULFAMETHOXAZOLE 400 MG-TRIMETHOPRIM 80 MG TABLET
ORAL_TABLET | ORAL | 3 refills | 28.00000 days | Status: CP
Start: 2019-07-01 — End: 2019-10-26

## 2019-07-01 MED ORDER — TACROLIMUS ER 1 MG TABLET,EXTENDED RELEASE 24 HR
ORAL_TABLET | 11 refills | 0 days
Start: 2019-07-01 — End: ?

## 2019-07-01 NOTE — Unmapped (Signed)
Reviewed tac level with H.Perry NP, decreasing Envarsus to 8mg  daily. Pt made aware, verbalized understanding. Updated prescription sent to [facility].

## 2019-07-02 LAB — TACROLIMUS, TROUGH: Lab: 4.4 — ABNORMAL LOW

## 2019-07-02 NOTE — Unmapped (Signed)
Change in Envarsus dosage increase. Refill too soon until 07/03/2019. Will attempt test claim on 07/03/2019 date.

## 2019-07-03 ENCOUNTER — Other Ambulatory Visit
Admission: RE | Admit: 2019-07-03 | Discharge: 2019-07-03 | Disposition: A | Discharge status: Home / Self Care | Payer: BLUE CROSS/BLUE SHIELD | Source: Ambulatory Visit | Attending: Nephrology | Admitting: Nephrology

## 2019-07-03 DIAGNOSIS — Z79899 Other long term (current) drug therapy: Secondary | ICD-10-CM | POA: Insufficient documentation

## 2019-07-03 DIAGNOSIS — E1129 Type 2 diabetes mellitus with other diabetic kidney complication: Secondary | ICD-10-CM | POA: Diagnosis not present

## 2019-07-03 DIAGNOSIS — N390 Urinary tract infection, site not specified: Secondary | ICD-10-CM | POA: Diagnosis not present

## 2019-07-03 DIAGNOSIS — Z940 Kidney transplant status: Secondary | ICD-10-CM | POA: Insufficient documentation

## 2019-07-03 DIAGNOSIS — E1122 Type 2 diabetes mellitus with diabetic chronic kidney disease: Secondary | ICD-10-CM | POA: Insufficient documentation

## 2019-07-03 DIAGNOSIS — B259 Cytomegaloviral disease, unspecified: Secondary | ICD-10-CM | POA: Insufficient documentation

## 2019-07-03 DIAGNOSIS — N189 Chronic kidney disease, unspecified: Secondary | ICD-10-CM | POA: Insufficient documentation

## 2019-07-03 DIAGNOSIS — E559 Vitamin D deficiency, unspecified: Secondary | ICD-10-CM | POA: Insufficient documentation

## 2019-07-03 DIAGNOSIS — D631 Anemia in chronic kidney disease: Secondary | ICD-10-CM | POA: Insufficient documentation

## 2019-07-03 DIAGNOSIS — Z114 Encounter for screening for human immunodeficiency virus [HIV]: Secondary | ICD-10-CM | POA: Insufficient documentation

## 2019-07-03 DIAGNOSIS — Z9483 Pancreas transplant status: Secondary | ICD-10-CM | POA: Insufficient documentation

## 2019-07-03 DIAGNOSIS — D899 Disorder involving the immune mechanism, unspecified: Secondary | ICD-10-CM | POA: Diagnosis not present

## 2019-07-03 LAB — BASIC METABOLIC PANEL
Anion gap: 10 (ref 5–15)
BUN: 27 mg/dL — ABNORMAL HIGH (ref 6–20)
CO2: 23 mmol/L (ref 22–32)
Calcium: 9.4 mg/dL (ref 8.9–10.3)
Chloride: 107 mmol/L (ref 98–111)
Creatinine, Ser: 1.68 mg/dL — ABNORMAL HIGH (ref 0.61–1.24)
GFR calc Af Amer: >60 mL/min (ref >60)
GFR calc non Af Amer: 52 mL/min — ABNORMAL LOW (ref >60)
Glucose, Bld: 135 mg/dL — ABNORMAL HIGH (ref 70–99)
Potassium: 4 mmol/L (ref 3.5–5.1)
Sodium: 140 mmol/L (ref 135–145)

## 2019-07-03 LAB — PHOSPHORUS: Phosphorus: 3 mg/dL (ref 2.5–4.6)

## 2019-07-03 LAB — CBC WITH DIFFERENTIAL/PLATELET
Abs Immature Granulocytes: 0.04 10*3/uL (ref 0.00–0.07)
Basophils Absolute: 0 10*3/uL (ref 0.0–0.1)
Basophils Relative: 0 %
Eosinophils Absolute: 0 10*3/uL (ref 0.0–0.5)
Eosinophils Relative: 1 %
HCT: 30.7 % — ABNORMAL LOW (ref 39.0–52.0)
Hemoglobin: 9.8 g/dL — ABNORMAL LOW (ref 13.0–17.0)
Immature Granulocytes: 1 %
Lymphocytes Relative: 2 %
Lymphs Abs: 0.1 10*3/uL — ABNORMAL LOW (ref 0.7–4.0)
MCH: 30.9 pg (ref 26.0–34.0)
MCHC: 31.9 g/dL (ref 30.0–36.0)
MCV: 96.8 fL (ref 80.0–100.0)
Monocytes Absolute: 0.4 10*3/uL (ref 0.1–1.0)
Monocytes Relative: 9 %
Neutro Abs: 4.2 10*3/uL (ref 1.7–7.7)
Neutrophils Relative %: 87 %
Platelets: 213 10*3/uL (ref 150–400)
RBC: 3.17 MIL/uL — ABNORMAL LOW (ref 4.22–5.81)
RDW: 16.4 % — ABNORMAL HIGH (ref 11.5–15.5)
WBC: 4.9 10*3/uL (ref 4.0–10.5)
nRBC: 0 % (ref 0.0–0.2)

## 2019-07-03 LAB — MAGNESIUM: Magnesium: 1.8 mg/dL (ref 1.7–2.4)

## 2019-07-03 NOTE — Unmapped (Signed)
Change in Envarsus dosage increase. Refill too soon until on co-pay card until 07/13/2019. Will attempt test claim on 07/13/2019 date.

## 2019-07-06 ENCOUNTER — Other Ambulatory Visit
Admission: RE | Admit: 2019-07-06 | Discharge: 2019-07-06 | Disposition: A | Discharge status: Home / Self Care | Payer: BLUE CROSS/BLUE SHIELD | Source: Ambulatory Visit | Attending: Nephrology | Admitting: Nephrology

## 2019-07-06 DIAGNOSIS — E1129 Type 2 diabetes mellitus with other diabetic kidney complication: Secondary | ICD-10-CM | POA: Insufficient documentation

## 2019-07-06 DIAGNOSIS — Z789 Other specified health status: Secondary | ICD-10-CM | POA: Insufficient documentation

## 2019-07-06 DIAGNOSIS — Z940 Kidney transplant status: Secondary | ICD-10-CM | POA: Insufficient documentation

## 2019-07-06 DIAGNOSIS — B259 Cytomegaloviral disease, unspecified: Secondary | ICD-10-CM | POA: Insufficient documentation

## 2019-07-06 DIAGNOSIS — Z9483 Pancreas transplant status: Secondary | ICD-10-CM | POA: Diagnosis not present

## 2019-07-06 DIAGNOSIS — N390 Urinary tract infection, site not specified: Secondary | ICD-10-CM | POA: Diagnosis not present

## 2019-07-06 DIAGNOSIS — Z114 Encounter for screening for human immunodeficiency virus [HIV]: Secondary | ICD-10-CM | POA: Diagnosis not present

## 2019-07-06 DIAGNOSIS — E559 Vitamin D deficiency, unspecified: Secondary | ICD-10-CM | POA: Insufficient documentation

## 2019-07-06 DIAGNOSIS — Z09 Encounter for follow-up examination after completed treatment for conditions other than malignant neoplasm: Secondary | ICD-10-CM | POA: Insufficient documentation

## 2019-07-06 DIAGNOSIS — D899 Disorder involving the immune mechanism, unspecified: Secondary | ICD-10-CM | POA: Insufficient documentation

## 2019-07-06 DIAGNOSIS — Z79899 Other long term (current) drug therapy: Secondary | ICD-10-CM | POA: Diagnosis not present

## 2019-07-06 DIAGNOSIS — N189 Chronic kidney disease, unspecified: Secondary | ICD-10-CM | POA: Diagnosis not present

## 2019-07-06 DIAGNOSIS — D631 Anemia in chronic kidney disease: Secondary | ICD-10-CM | POA: Insufficient documentation

## 2019-07-06 LAB — PHOSPHORUS
Lab: 3
Lab: 3.8
PHOSPHORUS: 3.8 mg/dL
Phosphorus: 3.8 mg/dL (ref 2.5–4.6)

## 2019-07-06 LAB — CBC WITH DIFFERENTIAL/PLATELET
Abs Immature Granulocytes: 0.03 10*3/uL (ref 0.00–0.07)
Basophils Absolute: 0 10*3/uL (ref 0.0–0.1)
Basophils Relative: 1 %
Eosinophils Absolute: 0.1 10*3/uL (ref 0.0–0.5)
Eosinophils Relative: 1 %
HCT: 30.2 % — ABNORMAL LOW (ref 39.0–52.0)
Hemoglobin: 9.6 g/dL — ABNORMAL LOW (ref 13.0–17.0)
Immature Granulocytes: 1 %
Lymphocytes Relative: 2 %
Lymphs Abs: 0.1 10*3/uL — ABNORMAL LOW (ref 0.7–4.0)
MCH: 31.5 pg (ref 26.0–34.0)
MCHC: 31.8 g/dL (ref 30.0–36.0)
MCV: 99 fL (ref 80.0–100.0)
Monocytes Absolute: 0.4 10*3/uL (ref 0.1–1.0)
Monocytes Relative: 9 %
Neutro Abs: 3.7 10*3/uL (ref 1.7–7.7)
Neutrophils Relative %: 86 %
Platelets: 220 10*3/uL (ref 150–400)
RBC: 3.05 MIL/uL — ABNORMAL LOW (ref 4.22–5.81)
RDW: 16.4 % — ABNORMAL HIGH (ref 11.5–15.5)
WBC: 4.3 10*3/uL (ref 4.0–10.5)
nRBC: 0 % (ref 0.0–0.2)

## 2019-07-06 LAB — BASIC METABOLIC PANEL
Anion gap: 10 (ref 5–15)
BLOOD UREA NITROGEN: 27 mg/dL — ABNORMAL HIGH
BLOOD UREA NITROGEN: 31 mg/dL — ABNORMAL HIGH
BUN: 31 mg/dL — ABNORMAL HIGH (ref 6–20)
CALCIUM: 9.4 mg/dL
CALCIUM: 9.2 mg/dL
CHLORIDE: 107 mmol/L
CHLORIDE: 109 mmol/L
CO2: 23 mmol/L
CO2: 22 mmol/L
CO2: 22 mmol/L (ref 22–32)
CREATININE: 1.68 mg/dL — ABNORMAL HIGH
CREATININE: 1.74 mg/dL — ABNORMAL HIGH
Calcium: 9.2 mg/dL (ref 8.9–10.3)
Chloride: 109 mmol/L (ref 98–111)
Creatinine, Ser: 1.74 mg/dL — ABNORMAL HIGH (ref 0.61–1.24)
EGFR CKD-EPI NON-AA MALE: 52 mL/min/{1.73_m2} — ABNORMAL LOW
EGFR CKD-EPI NON-AA MALE: 50 mL/min/{1.73_m2} — ABNORMAL LOW
GFR calc Af Amer: 58 mL/min — ABNORMAL LOW (ref >60)
GFR calc non Af Amer: 50 mL/min — ABNORMAL LOW (ref >60)
GLUCOSE RANDOM: 135 mg/dL
GLUCOSE RANDOM: 97 mg/dL
Glucose, Bld: 97 mg/dL (ref 70–99)
POTASSIUM: 4 mmol/L
Potassium: 4 mmol/L (ref 3.5–5.1)
SODIUM: 140 mmol/L
SODIUM: 141 mmol/L
Sodium: 141 mmol/L (ref 135–145)

## 2019-07-06 LAB — MAGNESIUM
Lab: 1.7
Lab: 1.8
Magnesium: 1.7 mg/dL (ref 1.7–2.4)

## 2019-07-06 LAB — TACROLIMUS LEVEL: Tacrolimus (FK506) - LabCorp: 15.5 ng/mL (ref 2.0–20.0)

## 2019-07-06 LAB — CBC W/ DIFFERENTIAL
BASOPHILS ABSOLUTE COUNT: 0 10*9/L
BASOPHILS RELATIVE PERCENT: 0 %
BASOPHILS RELATIVE PERCENT: 1 %
EOSINOPHILS ABSOLUTE COUNT: 0 10*9/L
EOSINOPHILS ABSOLUTE COUNT: 0.1 10*9/L
EOSINOPHILS RELATIVE PERCENT: 1 %
EOSINOPHILS RELATIVE PERCENT: 1 %
HEMATOCRIT: 30.7 % — ABNORMAL LOW
HEMATOCRIT: 30.2 % — ABNORMAL LOW
HEMOGLOBIN: 9.8 g/dL — ABNORMAL LOW
HEMOGLOBIN: 9.6 g/dL — ABNORMAL LOW
LYMPHOCYTES ABSOLUTE COUNT: 0.1 10*9/L — ABNORMAL LOW
LYMPHOCYTES ABSOLUTE COUNT: 0.1 10*9/L — ABNORMAL LOW
LYMPHOCYTES RELATIVE PERCENT: 2 %
LYMPHOCYTES RELATIVE PERCENT: 2 %
MEAN CORPUSCULAR HEMOGLOBIN CONC: 31.9 g/dL
MEAN CORPUSCULAR HEMOGLOBIN CONC: 31.8 g/dL
MEAN CORPUSCULAR HEMOGLOBIN: 30.9 pg
MEAN CORPUSCULAR HEMOGLOBIN: 31.5 pg
MEAN CORPUSCULAR VOLUME: 96.8 fL
MEAN CORPUSCULAR VOLUME: 99 fL
MONOCYTES ABSOLUTE COUNT: 0.4 10*9/L
MONOCYTES ABSOLUTE COUNT: 0.4 10*9/L
MONOCYTES RELATIVE PERCENT: 9 %
MONOCYTES RELATIVE PERCENT: 9 %
NEUTROPHILS ABSOLUTE COUNT: 4.2 10*9/L
NEUTROPHILS ABSOLUTE COUNT: 3.7 10*9/L
NEUTROPHILS RELATIVE PERCENT: 87 %
NEUTROPHILS RELATIVE PERCENT: 86 %
PLATELET COUNT: 220 10*9/L
RED BLOOD CELL COUNT: 3.17 10*12/L — ABNORMAL LOW
RED BLOOD CELL COUNT: 3.05 10*12/L — ABNORMAL LOW
RED CELL DISTRIBUTION WIDTH: 16.4 % — ABNORMAL HIGH
RED CELL DISTRIBUTION WIDTH: 16.4 % — ABNORMAL HIGH
WBC ADJUSTED: 4.9 10*9/L
WBC ADJUSTED: 4.3 10*9/L

## 2019-07-06 LAB — NEUTROPHILS RELATIVE PERCENT: Lab: 86

## 2019-07-06 LAB — TACROLIMUS, TROUGH: Lab: 15.5

## 2019-07-06 LAB — EGFR CKD-EPI AA MALE: Lab: 58 — ABNORMAL LOW

## 2019-07-06 LAB — RED CELL DISTRIBUTION WIDTH: Lab: 16.4 — ABNORMAL HIGH

## 2019-07-06 LAB — GLUCOSE RANDOM: Lab: 135

## 2019-07-07 DIAGNOSIS — Z940 Kidney transplant status: Principal | ICD-10-CM

## 2019-07-07 DIAGNOSIS — Z79899 Other long term (current) drug therapy: Principal | ICD-10-CM

## 2019-07-07 NOTE — Unmapped (Signed)
Lab orders entered

## 2019-07-08 LAB — TACROLIMUS LEVEL: Tacrolimus (FK506) - LabCorp: 5.6 ng/mL (ref 2.0–20.0)

## 2019-07-10 ENCOUNTER — Other Ambulatory Visit
Admission: RE | Admit: 2019-07-10 | Discharge: 2019-07-10 | Disposition: A | Discharge status: Home / Self Care | Payer: BLUE CROSS/BLUE SHIELD | Source: Ambulatory Visit | Attending: Nephrology | Admitting: Nephrology

## 2019-07-10 ENCOUNTER — Other Ambulatory Visit: Payer: Self-pay

## 2019-07-10 DIAGNOSIS — D631 Anemia in chronic kidney disease: Secondary | ICD-10-CM | POA: Diagnosis not present

## 2019-07-10 DIAGNOSIS — N390 Urinary tract infection, site not specified: Secondary | ICD-10-CM | POA: Insufficient documentation

## 2019-07-10 DIAGNOSIS — E1129 Type 2 diabetes mellitus with other diabetic kidney complication: Secondary | ICD-10-CM | POA: Insufficient documentation

## 2019-07-10 DIAGNOSIS — B259 Cytomegaloviral disease, unspecified: Secondary | ICD-10-CM | POA: Insufficient documentation

## 2019-07-10 DIAGNOSIS — Z09 Encounter for follow-up examination after completed treatment for conditions other than malignant neoplasm: Secondary | ICD-10-CM | POA: Insufficient documentation

## 2019-07-10 DIAGNOSIS — Z79899 Other long term (current) drug therapy: Secondary | ICD-10-CM | POA: Diagnosis not present

## 2019-07-10 DIAGNOSIS — Z114 Encounter for screening for human immunodeficiency virus [HIV]: Secondary | ICD-10-CM | POA: Diagnosis not present

## 2019-07-10 DIAGNOSIS — Z9483 Pancreas transplant status: Secondary | ICD-10-CM | POA: Insufficient documentation

## 2019-07-10 DIAGNOSIS — D899 Disorder involving the immune mechanism, unspecified: Secondary | ICD-10-CM | POA: Insufficient documentation

## 2019-07-10 DIAGNOSIS — Z789 Other specified health status: Secondary | ICD-10-CM | POA: Diagnosis not present

## 2019-07-10 DIAGNOSIS — E559 Vitamin D deficiency, unspecified: Secondary | ICD-10-CM | POA: Insufficient documentation

## 2019-07-10 DIAGNOSIS — N189 Chronic kidney disease, unspecified: Secondary | ICD-10-CM | POA: Insufficient documentation

## 2019-07-10 DIAGNOSIS — Z940 Kidney transplant status: Secondary | ICD-10-CM | POA: Insufficient documentation

## 2019-07-10 LAB — CBC WITH DIFFERENTIAL/PLATELET
Abs Immature Granulocytes: 0.02 10*3/uL (ref 0.00–0.07)
Basophils Absolute: 0 10*3/uL (ref 0.0–0.1)
Basophils Relative: 1 %
Eosinophils Absolute: 0.1 10*3/uL (ref 0.0–0.5)
Eosinophils Relative: 2 %
HCT: 30.5 % — ABNORMAL LOW (ref 39.0–52.0)
Hemoglobin: 9.8 g/dL — ABNORMAL LOW (ref 13.0–17.0)
Immature Granulocytes: 1 %
Lymphocytes Relative: 2 %
Lymphs Abs: 0.1 10*3/uL — ABNORMAL LOW (ref 0.7–4.0)
MCH: 31.6 pg (ref 26.0–34.0)
MCHC: 32.1 g/dL (ref 30.0–36.0)
MCV: 98.4 fL (ref 80.0–100.0)
Monocytes Absolute: 0.4 10*3/uL (ref 0.1–1.0)
Monocytes Relative: 10 %
Neutro Abs: 3.7 10*3/uL (ref 1.7–7.7)
Neutrophils Relative %: 84 %
Platelets: 243 10*3/uL (ref 150–400)
RBC: 3.1 MIL/uL — ABNORMAL LOW (ref 4.22–5.81)
RDW: 16.5 % — ABNORMAL HIGH (ref 11.5–15.5)
WBC: 4.3 10*3/uL (ref 4.0–10.5)
nRBC: 0 % (ref 0.0–0.2)

## 2019-07-10 LAB — PHOSPHORUS: Phosphorus: 3.6 mg/dL (ref 2.5–4.6)

## 2019-07-10 LAB — BASIC METABOLIC PANEL
Anion gap: 10 (ref 5–15)
BUN: 31 mg/dL — ABNORMAL HIGH (ref 6–20)
CO2: 23 mmol/L (ref 22–32)
Calcium: 9.4 mg/dL (ref 8.9–10.3)
Chloride: 108 mmol/L (ref 98–111)
Creatinine, Ser: 1.65 mg/dL — ABNORMAL HIGH (ref 0.61–1.24)
GFR calc Af Amer: >60 mL/min (ref >60)
GFR calc non Af Amer: 53 mL/min — ABNORMAL LOW (ref >60)
Glucose, Bld: 115 mg/dL — ABNORMAL HIGH (ref 70–99)
Potassium: 4 mmol/L (ref 3.5–5.1)
Sodium: 141 mmol/L (ref 135–145)

## 2019-07-10 LAB — MAGNESIUM: Magnesium: 1.7 mg/dL (ref 1.7–2.4)

## 2019-07-13 ENCOUNTER — Other Ambulatory Visit
Admission: RE | Admit: 2019-07-13 | Discharge: 2019-07-13 | Disposition: A | Discharge status: Home / Self Care | Payer: BLUE CROSS/BLUE SHIELD | Source: Ambulatory Visit | Attending: Nephrology | Admitting: Nephrology

## 2019-07-13 DIAGNOSIS — N390 Urinary tract infection, site not specified: Secondary | ICD-10-CM | POA: Insufficient documentation

## 2019-07-13 DIAGNOSIS — D631 Anemia in chronic kidney disease: Secondary | ICD-10-CM | POA: Diagnosis not present

## 2019-07-13 DIAGNOSIS — E1129 Type 2 diabetes mellitus with other diabetic kidney complication: Secondary | ICD-10-CM | POA: Diagnosis not present

## 2019-07-13 DIAGNOSIS — Z114 Encounter for screening for human immunodeficiency virus [HIV]: Secondary | ICD-10-CM | POA: Insufficient documentation

## 2019-07-13 DIAGNOSIS — Z09 Encounter for follow-up examination after completed treatment for conditions other than malignant neoplasm: Secondary | ICD-10-CM | POA: Insufficient documentation

## 2019-07-13 DIAGNOSIS — Z789 Other specified health status: Secondary | ICD-10-CM | POA: Diagnosis not present

## 2019-07-13 DIAGNOSIS — Z940 Kidney transplant status: Secondary | ICD-10-CM | POA: Insufficient documentation

## 2019-07-13 DIAGNOSIS — D899 Disorder involving the immune mechanism, unspecified: Secondary | ICD-10-CM | POA: Insufficient documentation

## 2019-07-13 DIAGNOSIS — Z79899 Other long term (current) drug therapy: Secondary | ICD-10-CM | POA: Diagnosis not present

## 2019-07-13 DIAGNOSIS — E229 Hyperfunction of pituitary gland, unspecified: Secondary | ICD-10-CM | POA: Insufficient documentation

## 2019-07-13 DIAGNOSIS — Z9483 Pancreas transplant status: Secondary | ICD-10-CM | POA: Insufficient documentation

## 2019-07-13 DIAGNOSIS — B259 Cytomegaloviral disease, unspecified: Secondary | ICD-10-CM | POA: Diagnosis not present

## 2019-07-13 LAB — TACROLIMUS LEVEL: Tacrolimus (FK506) - LabCorp: 6.2 ng/mL (ref 2.0–20.0)

## 2019-07-13 LAB — CBC WITH DIFFERENTIAL/PLATELET
Abs Immature Granulocytes: 0.07 10*3/uL (ref 0.00–0.07)
Basophils Absolute: 0 10*3/uL (ref 0.0–0.1)
Basophils Relative: 1 %
Eosinophils Absolute: 0.1 10*3/uL (ref 0.0–0.5)
Eosinophils Relative: 1 %
HCT: 31.3 % — ABNORMAL LOW (ref 39.0–52.0)
Hemoglobin: 10 g/dL — ABNORMAL LOW (ref 13.0–17.0)
Immature Granulocytes: 2 %
Lymphocytes Relative: 3 %
Lymphs Abs: 0.1 10*3/uL — ABNORMAL LOW (ref 0.7–4.0)
MCH: 31.5 pg (ref 26.0–34.0)
MCHC: 31.9 g/dL (ref 30.0–36.0)
MCV: 98.7 fL (ref 80.0–100.0)
Monocytes Absolute: 0.4 10*3/uL (ref 0.1–1.0)
Monocytes Relative: 9 %
Neutro Abs: 3.5 10*3/uL (ref 1.7–7.7)
Neutrophils Relative %: 84 %
Platelets: 235 10*3/uL (ref 150–400)
RBC: 3.17 MIL/uL — ABNORMAL LOW (ref 4.22–5.81)
RDW: 16.1 % — ABNORMAL HIGH (ref 11.5–15.5)
WBC: 4.1 10*3/uL (ref 4.0–10.5)
nRBC: 0 % (ref 0.0–0.2)

## 2019-07-13 LAB — BASIC METABOLIC PANEL
Anion gap: 9 (ref 5–15)
BUN: 38 mg/dL — ABNORMAL HIGH (ref 6–20)
CO2: 23 mmol/L (ref 22–32)
Calcium: 9.3 mg/dL (ref 8.9–10.3)
Chloride: 104 mmol/L (ref 98–111)
Creatinine, Ser: 1.54 mg/dL — ABNORMAL HIGH (ref 0.61–1.24)
GFR calc Af Amer: >60 mL/min (ref >60)
GFR calc non Af Amer: 58 mL/min — ABNORMAL LOW (ref >60)
Glucose, Bld: 92 mg/dL (ref 70–99)
Potassium: 3.9 mmol/L (ref 3.5–5.1)
Sodium: 136 mmol/L (ref 135–145)

## 2019-07-13 LAB — PHOSPHORUS: Phosphorus: 3.7 mg/dL (ref 2.5–4.6)

## 2019-07-13 LAB — MAGNESIUM: Magnesium: 1.9 mg/dL (ref 1.7–2.4)

## 2019-07-13 LAB — CBC W/ DIFFERENTIAL
BASOPHILS ABSOLUTE COUNT: 0 10*9/L
BASOPHILS RELATIVE PERCENT: 1 %
EOSINOPHILS ABSOLUTE COUNT: 0.1 10*9/L
EOSINOPHILS RELATIVE PERCENT: 1 %
HEMOGLOBIN: 10 g/dL — ABNORMAL LOW
LYMPHOCYTES ABSOLUTE COUNT: 0.1 10*9/L — ABNORMAL LOW
LYMPHOCYTES RELATIVE PERCENT: 3 %
MEAN CORPUSCULAR HEMOGLOBIN CONC: 31.9 g/dL
MEAN CORPUSCULAR HEMOGLOBIN: 31.3 pg
MEAN CORPUSCULAR VOLUME: 98.7 fL
MONOCYTES ABSOLUTE COUNT: 0.4 10*9/L
MONOCYTES RELATIVE PERCENT: 0.4 %
NEUTROPHILS ABSOLUTE COUNT: 3.5 10*9/L
NEUTROPHILS RELATIVE PERCENT: 84 %
PLATELET COUNT: 235 10*9/L
RED BLOOD CELL COUNT: 3.17 10*12/L — ABNORMAL LOW
RED CELL DISTRIBUTION WIDTH: 16.1 % — ABNORMAL HIGH
WBC ADJUSTED: 4.1 10*9/L

## 2019-07-13 LAB — NEUTROPHILS RELATIVE PERCENT: Lab: 84

## 2019-07-13 NOTE — Unmapped (Signed)
Change in Envarsus dosage increase. Co-pay $0.00.

## 2019-07-13 NOTE — Unmapped (Signed)
Transplant Nephrology Clinic Visit      History of Present Illness    34 y.o. male here for follow up after kidney transplantation.  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  Patient has been on dialysis since 12/16/2017.  Patient's history includes HTN and anemia of CKD    Patient was admitted for kidney transplant on 04/28/2019.  In the PACU a peri-incisional hematoma was found.  US showed minor increase in resistive indices in the kidney and a perinephric hematoma.  [place] on 04/29/19 showed improved renal perfusion and decreased resistive indices with persistent hematoma.  Kidney Biopsy on 06/19/19 showed no pathologic abnormality identified other than mild to moderate arteriosclerosis and evidence for rejection (C4d neg)..       Transplant History:    Organ Received: Left LDKT via NKR  Native Kidney Disease: ANCA Vasculitis; cPRA: negative  Date of Transplant: 04/28/19  Post-Transplant Course: Perinephric Hematoma  Prior Transplants: None  Induction: [place]  Date of Ureteral Stent Removal: 06/11/2019  CMV/EBV Status: CMV D-/R+  EBV D+/R+  Rejection Episodes: None  Donor Specific Antibodies: None  Results of Renal Imaging (pre and post):   Pre-Txp 03/27/2019  Normal size with increased echogenicity. No solid masses or calculi. No hydronephrosis.    Post-Txp 05/18/2019  NATIVE KIDNEYS: The native kidneys are small and echogenic. No solid masses or calculi. No hydronephrosis.  ??  TRANSPLANTED KIDNEY: The renal transplant was located in the right lower quadrant. Normal size and echogenicity.  No solid masses or calculi. 5.1 x 2.9 x 1.6 cm septated fluid collection superficial to the right lower quadrant kidney transplant, previously measured 2.4 x 2.1 x 1.5 cm. Double-J ureteral stent with proximal tip in the renal pelvis and distal tip within the bladder. No hydronephrosis.  NATIVE KIDNEYS: The native kidneys are small and echogenic. No solid masses or calculi. No hydronephrosis. Current Immunosuppression Regimen:   Envarsus 8 mg daily  Myfortic 540mg  BID      Subjective/Interval:   Since last visit patient had a kidney biopsy on 06/19/19 that showed no pathologic abnormality identified other than mild to moderate arteriosclerosis and evidence for rejection (C4d neg).      Today patient came to appointment alone.  His home BP running 120/80's.  He states he is drinking 6 bottles of water a day. He states he is taking his medication correctly.  He is taking his Tacrolimus at 9 am and his Myfortic at 12 pm (first dose).  He states he is having occasional headaches in the afternoon/night that he takes tylenol for.  He believes the headaches are related to his anxiety as he states it gets worse towards the end of the day.  His hand tremor gets worse during this time as well.  He is taking xanax about 4 times a week and took a dose before his appointment today.  He complains of joint pain throughout his body for the past 1-2 weeks. He states he started working out and doing Yoga recently. He also states his urine has an odor at times.  He denies dysuria, hematuria, pain/burning with urination.  He denies lightheadedness, chest pain, shortness of breath, abdominal pain, n/v/d, edema. He does continue to have some pain in his right groin.    He continues to smoke marijuana and eat edibles.  He states he is doing this at least twice a day.  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  He would like to  be weaned off his Gabapentin if possible.    Last dose of Prograf: 9 AM    Past Medical History  1. ANCA Vasculitis  2. HTN  3. Anemia of CKD    Review of Systems    Otherwise as per HPI, all other systems reviewed and are negative.    Medications  Current Outpatient Medications   Medication Sig Dispense Refill   ??? acetaminophen (TYLENOL EXTRA STRENGTH) 500 MG tablet Take 2 tablets (1,000 mg total) by mouth every six (6) hours as needed for pain or fever (> 38C). 100 tablet 0   ??? ALPRAZolam (XANAX) 0.25 MG tablet Take 0.5 mg by mouth every twelve (12) hours.      ??? amLODIPine (NORVASC) 10 MG tablet Take 1 tablet (10 mg total) by mouth daily. 90 tablet 3   ??? aspirin (ECOTRIN) 81 MG tablet Take 1 tablet (81 mg total) by mouth daily. 30 tablet 11   ??? busPIRone (BUSPAR) 15 MG tablet Take 15 mg by mouth two (2) times a day.     ??? cloNIDine HCL (CATAPRES) 0.1 MG tablet Take 1 tablet (0.1 mg total) by mouth Two (2) times a day. 60 tablet 11   ??? docusate sodium (COLACE) 100 MG capsule Take 1 capsule (100 mg total) by mouth Two (2) times a day. 180 capsule 3   ??? ergocalciferol (DRISDOL) 1,250 mcg (50,000 unit) capsule Take 1 capsule (50,000 Units total) by mouth once a week. (Patient not taking: Reported on 06/19/2019) 4 capsule 1   ??? escitalopram oxalate (LEXAPRO) 10 MG tablet Take 15 mg by mouth.     ??? gabapentin (NEURONTIN) 100 MG capsule Take 200 mg 3 times daily for 3 days then 100 mg 3 times daily for 3 days then stop 270 capsule 1   ??? hydrALAZINE (APRESOLINE) 100 MG tablet Take 1 tablet (100 mg total) by mouth Two (2) times a day. 30 tablet 11   ??? magnesium oxide-Mg AA chelate (MAGNESIUM, AMINO ACID CHELATE,) 133 mg Tab Take 1 tablet by mouth Two (2) times a day. 60 tablet 11   ??? mycophenolate (MYFORTIC) 180 MG EC tablet Take 3 tablets (540 mg total) by mouth Two (2) times a day. 180 tablet 11   ??? omeprazole (PRILOSEC) 20 MG capsule Take 20 mg by mouth two (2) times a day.      ??? predniSONE (DELTASONE) 5 MG tablet Take 1 tablet (5 mg total) by mouth daily. 30 tablet 11   ??? sulfamethoxazole-trimethoprim (BACTRIM) 400-80 mg per tablet Take 1 tablet (80 mg of trimethoprim total) by mouth 3 (three) times a week. 12 tablet 3   ??? tacrolimus (ENVARSUS XR) 1 mg Tb24 extended release tablet HOLD 90 tablet 11   ??? tacrolimus (ENVARSUS XR) 4 mg Tb24 extended release tablet Take 2 tablets (8 mg total) by mouth daily. 60 tablet 11   ??? valGANciclovir (VALCYTE) 450 mg tablet Take 1 tablet (450 mg total) by mouth daily for 15 days. 15 tablet 0     No current facility-administered medications for this visit.            Physical Exam  BP 115/78  - Pulse 88  - Temp 36.3 ??C (97.3 ??F) (Tympanic)  - Ht 182.9 cm (6')  - Wt 71.4 kg (157 lb 4.8 oz)  - BMI 21.33 kg/m??   General: in minimal distress, skin warm to touch  HEENT: wearing mask, PERRL  Neck: neck supple, no cervical lymphadenopathy appreciated  CV: normal  rate, normal rhythm, no murmur, no gallops, no rubs appreciated  Lungs: clear to auscultation bilaterally  Abdomen: soft, non tender over graft site.  Incision healed without erythema, warmth or edema.    Extremities:  no edema noted  Musculoskeletal: no visible deformity, normal range of motion.  Pulses: intact distally throughout  Neurologic: awake, alert, and oriented x3      Laboratory Data and Imaging reviewed in EPIC      Assessment:  34 y.o. male status post living unrelated donor kidney transplant on 04/28/2019 as part of NKR, for ESRD secondary to ANCA Vasculitis who presents for routine follow up and post-transplant care.         Recommendations/Plan:     Allograft Function: Renal function stable at 1.76 with baseline of approximately 1.7-1.9 since transplant. No DSAs at this time (05/26/19), with today's pending.  UPC normal at 0.160.      Kidney Biopsy (06/19/19): no pathologic abnormality identified other than mild to moderate arteriosclerosis and evidence for rejection (C4d neg).    Immunosuppression Management [High Risk Medical Decision Making For Drug Therapy Requiring Intensive Monitoring For Toxicity]: Tacrolimus trough level 2.8 (13 hr trough).  Envarsus 8 mg.  Targeting tacrolimus trough levels of approximately 8-10 ng/mL.   Continue mycophenolate 540 mg BID. Prednisone 5 mg.  Unsure if patient is taking his tacrolimus correctly due to levels being all over the place.     Blood Pressure Management: BP 115/78 at this visit. Continue current regimen as BP stable at home.       Lipid Management: Last lipid panel on 03/23/2019.  Patient is currently not on a statin.  The ASCVD Risk score Denman George DC Jorge Ny al., 2013) failed to calculate.    Electrolytes: Electrolyte and metabolic parameters in acceptable range.  Will continue to monitor.  Vitamin D level 14.6.  Patient started on Ergocalciferol 50,000 units weekly at last visit but did not start taking it.  Will have patient start taking this medication.    Infectious Prophylaxis and Monitoring: CMV VL not detected 05/26/19.  No decoy cells present 05/11/2019.   The patient continues on Valcyte and Bactrim prophylaxis.     Hx Cytoxan Exposure:  Needs repeat cystogram 2024    Pain at RLQ:  Resolved. No longer on Tramadol. Continues to have occasional pain in right groin.  Will taper off Gabapentin per patient request.     Anxiety. Follows with Dr. Larwance Sachs at [facility].  Currently on Lexapro 15mg .  He is being started on Buspar 15 mg BID in order to wean/stop Xanax use. Currently still using Xanax about 4 times a week and before MD appts.    Marijuana Use.  Currently smoking and using edibles.  States using about twice a day.      Health Maintenance:   Colonoscopy after age 72    Immunizations:   Flu Shot: 03/03/2019  Prevnar 13: 12/30/17  Pneumovax: 12/16/17  No immunizations till 1 year post transplant.    Counseling:  I counseled the patient on:  The need to avoid sun exposure and the use of sunblock while outdoors given the relatively higher risk of skin malignancy in an immunosuppressed state.  The need for adherence to immunosuppression medication.  Patient verbalized understanding.     Follow-Up:  Return to clinic in: 4-6 weeks with Dr. Toni Arthurs at [place].  Graduate today.   Labs: 3 times a week  Patient will continue to follow-up with his primary care provider for non-transplant related issues  and medication refills. We have ordered transplant specific labs per the center's guidelines to monitor and assess for toxicities from immunosuppressant drug therapy Carson Meche L. Marina Goodell, MSN, APRN, AGNP-C - Kidney Transplant Nurse Practitioner  [facility] for Transplant Care

## 2019-07-14 ENCOUNTER — Ambulatory Visit: Admit: 2019-07-14 | Discharge: 2019-07-15 | Payer: PRIVATE HEALTH INSURANCE

## 2019-07-14 ENCOUNTER — Encounter: Admit: 2019-07-14 | Discharge: 2019-07-15 | Payer: PRIVATE HEALTH INSURANCE

## 2019-07-14 DIAGNOSIS — Z940 Kidney transplant status: Principal | ICD-10-CM

## 2019-07-14 LAB — COMPREHENSIVE METABOLIC PANEL
ALBUMIN: 4.8 g/dL (ref 3.5–5.0)
ALKALINE PHOSPHATASE: 65 U/L (ref 38–126)
ALT (SGPT): 21 U/L (ref <50)
ANION GAP: 9 mmol/L (ref 7–15)
AST (SGOT): 19 U/L (ref 19–55)
BILIRUBIN TOTAL: 0.3 mg/dL (ref 0.0–1.2)
BLOOD UREA NITROGEN: 29 mg/dL — ABNORMAL HIGH (ref 7–21)
BUN / CREAT RATIO: 16
CALCIUM: 9.8 mg/dL (ref 8.5–10.2)
CHLORIDE: 105 mmol/L (ref 98–107)
CREATININE: 1.76 mg/dL — ABNORMAL HIGH (ref 0.70–1.30)
EGFR CKD-EPI AA MALE: 57 mL/min/{1.73_m2} — ABNORMAL LOW (ref >=60)
EGFR CKD-EPI NON-AA MALE: 49 mL/min/{1.73_m2} — ABNORMAL LOW (ref >=60)
GLUCOSE RANDOM: 105 mg/dL (ref 70–179)
POTASSIUM: 4.3 mmol/L (ref 3.5–5.0)
PROTEIN TOTAL: 7.3 g/dL (ref 6.5–8.3)
SODIUM: 140 mmol/L (ref 135–145)

## 2019-07-14 LAB — URINALYSIS
BACTERIA: NONE SEEN /HPF
BILIRUBIN UA: NEGATIVE
BLOOD UA: NEGATIVE
GLUCOSE UA: NEGATIVE
KETONES UA: NEGATIVE
NITRITE UA: NEGATIVE
PH UA: 5 (ref 5.0–9.0)
PROTEIN UA: NEGATIVE
RBC UA: <1 /HPF (ref <=3)
SPECIFIC GRAVITY UA: 1.018 (ref 1.003–1.030)
SQUAMOUS EPITHELIAL: <1 /HPF (ref 0–5)
UROBILINOGEN UA: 0.2
WBC UA: 1 /HPF (ref <=2)

## 2019-07-14 LAB — ALBUMIN: Albumin:MCnc:Pt:Ser/Plas:Qn:: 4.8

## 2019-07-14 LAB — CBC W/ AUTO DIFF
BASOPHILS ABSOLUTE COUNT: 0 10*9/L (ref 0.0–0.1)
BASOPHILS RELATIVE PERCENT: 0.3 %
EOSINOPHILS ABSOLUTE COUNT: 0.1 10*9/L (ref 0.0–0.4)
EOSINOPHILS RELATIVE PERCENT: 1.1 %
HEMATOCRIT: 33.7 % — ABNORMAL LOW (ref 41.0–53.0)
HEMOGLOBIN: 10.9 g/dL — ABNORMAL LOW (ref 13.5–17.5)
LARGE UNSTAINED CELLS: 1 % (ref 0–4)
LYMPHOCYTES ABSOLUTE COUNT: 0.1 10*9/L — ABNORMAL LOW (ref 1.5–5.0)
LYMPHOCYTES RELATIVE PERCENT: 2.7 %
MEAN CORPUSCULAR HEMOGLOBIN CONC: 32.4 g/dL (ref 31.0–37.0)
MEAN CORPUSCULAR HEMOGLOBIN: 32.6 pg (ref 26.0–34.0)
MEAN PLATELET VOLUME: 8.9 fL (ref 7.0–10.0)
MONOCYTES ABSOLUTE COUNT: 0.2 10*9/L (ref 0.2–0.8)
MONOCYTES RELATIVE PERCENT: 5.1 %
NEUTROPHILS ABSOLUTE COUNT: 4.1 10*9/L (ref 2.0–7.5)
PLATELET COUNT: 289 10*9/L (ref 150–440)
RED BLOOD CELL COUNT: 3.35 10*12/L — ABNORMAL LOW (ref 4.50–5.90)
RED CELL DISTRIBUTION WIDTH: 17.1 % — ABNORMAL HIGH (ref 12.0–15.0)
WBC ADJUSTED: 4.6 10*9/L (ref 4.5–11.0)

## 2019-07-14 LAB — TACROLIMUS, TROUGH: Lab: 2.8 — ABNORMAL LOW

## 2019-07-14 LAB — CREATININE, URINE: Lab: 124.9

## 2019-07-14 LAB — PROTEIN / CREATININE RATIO, URINE
CREATININE, URINE: 124.9 mg/dL
PROTEIN URINE: 20 mg/dL

## 2019-07-14 LAB — BASOPHILS RELATIVE PERCENT: Basophils/100 leukocytes:NFr:Pt:Bld:Qn:Automated count: 0.3

## 2019-07-14 LAB — PHOSPHORUS: Phosphate:MCnc:Pt:Ser/Plas:Qn:: 3.7

## 2019-07-14 LAB — MAGNESIUM: Magnesium:MCnc:Pt:Ser/Plas:Qn:: 1.9

## 2019-07-14 LAB — BLOOD UA: Hemoglobin:PrThr:Pt:Urine:Ord:Test strip: NEGATIVE

## 2019-07-14 MED ORDER — GABAPENTIN 100 MG CAPSULE
ORAL_CAPSULE | 1 refills | 0 days
Start: 2019-07-14 — End: ?

## 2019-07-14 MED ORDER — DOCUSATE SODIUM 100 MG CAPSULE
ORAL_CAPSULE | Freq: Two times a day (BID) | ORAL | 3 refills | 90 days
Start: 2019-07-14 — End: ?

## 2019-07-14 MED ORDER — VALGANCICLOVIR 450 MG TABLET
ORAL_TABLET | Freq: Every day | ORAL | 0 refills | 15.00000 days | Status: CP
Start: 2019-07-14 — End: 2019-07-29
  Filled 2019-07-21: qty 15, 15d supply, fill #0

## 2019-07-14 NOTE — Unmapped (Signed)
[facility] TRANSPLANT CLINIC PHARMACY NOTE  07/13/2019   Ryan Moon  [id number]    Medication changes today:   1. Decrease gabapentin to 200 mg TID x3 days then 100 mg TID x3 days then stop  2. Decrease Mg to 133 mg once daily    Education/Adherence tools provided today:  1.provided updated medication list  2. provided additional pill box education  3.  provided additional education on immunosuppression and transplant related medications including reviewing indications of medications, dosing and side effects    Follow up items:  1. goal of understanding indications and dosing of immunosuppression medications  2. Follow up DSA panel from today  3. Consider weaning off clonidine if BP remain stable    Next visit with pharmacy in 1-3 months  ____________________________________________________________________    Ryan Moon is a 34 y.o. 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  He presented to the ED 1 day transplant hospitalization discharge for abdominal pain and dysuria.  Renal U/S showed interval decrease perinephric fluid collection but a large post void residual.  He was started on Flomax and a foley catheter was placed that has since been removed. He has had 1 readmission since discharge for pain, where he was treated for an E. Faecalis UTI with doxycycline.    Interval history: n/a    Seen by pharmacy today for: medication management and pill box fill and adherence education; last seen by pharmacy 1 months ago     CC:  Patient complains of  hand tremor, which he feels is worse recently. Reports ongoing anxiety and intermittent headaches.    Vitals:    07/14/19 0915   BP: 115/78   Pulse: 88   Temp: 36.3 ??C (97.3 ??F)       Allergies   Allergen Reactions   ??? Acetaminophen Nausea And Vomiting       All medications reviewed and updated.     Medication list includes revisions made during today???s encounter    Outpatient Encounter Medications as of 07/14/2019   Medication Sig Dispense Refill   ??? acetaminophen (TYLENOL EXTRA STRENGTH) 500 MG tablet Take 2 tablets (1,000 mg total) by mouth every six (6) hours as needed for pain or fever (> 38C). 100 tablet 0   ??? ALPRAZolam (XANAX) 0.25 MG tablet Take 0.5 mg by mouth every twelve (12) hours.      ??? amLODIPine (NORVASC) 10 MG tablet Take 1 tablet (10 mg total) by mouth daily. 90 tablet 3   ??? aspirin (ECOTRIN) 81 MG tablet Take 1 tablet (81 mg total) by mouth daily. 30 tablet 11   ??? busPIRone (BUSPAR) 15 MG tablet Take 15 mg by mouth two (2) times a day.     ??? cloNIDine HCL (CATAPRES) 0.1 MG tablet Take 1 tablet (0.1 mg total) by mouth Two (2) times a day. 60 tablet 11   ??? docusate sodium (COLACE) 100 MG capsule Take 1 capsule (100 mg total) by mouth two (2) times a day as needed for constipation. 100 capsule 11   ??? ergocalciferol (DRISDOL) 1,250 mcg (50,000 unit) capsule Take 1 capsule (50,000 Units total) by mouth once a week. (Patient not taking: Reported on 06/19/2019) 4 capsule 1   ??? escitalopram oxalate (  LEXAPRO) 10 MG tablet Take 15 mg by mouth.     ??? gabapentin (NEURONTIN) 100 MG capsule Take 3 capsules (300 mg total) by mouth Three (3) times a day. 270 capsule 1   ??? hydrALAZINE (APRESOLINE) 100 MG tablet Take 1 tablet (100 mg total) by mouth Two (2) times a day. 30 tablet 11   ??? magnesium oxide-Mg AA chelate (MAGNESIUM, AMINO ACID CHELATE,) 133 mg Tab Take 1 tablet by mouth Two (2) times a day. 60 tablet 11   ??? mycophenolate (MYFORTIC) 180 MG EC tablet Take 3 tablets (540 mg total) by mouth Two (2) times a day. 180 tablet 11   ??? omeprazole (PRILOSEC) 20 MG capsule Take 20 mg by mouth two (2) times a day.      ??? predniSONE (DELTASONE) 5 MG tablet Take 1 tablet (5 mg total) by mouth daily. 30 tablet 11   ??? sulfamethoxazole-trimethoprim (BACTRIM) 400-80 mg per tablet Take 1 tablet (80 mg of trimethoprim total) by mouth 3 (three) times a week. 12 tablet 3   ??? tacrolimus (ENVARSUS XR) 1 mg Tb24 extended release tablet HOLD 90 tablet 11   ??? tacrolimus (ENVARSUS XR) 4 mg Tb24 extended release tablet Take 2 tablets (8 mg total) by mouth daily. 60 tablet 11   ??? valGANciclovir (VALCYTE) 450 mg tablet Take 1 tablet (450 mg total) by mouth daily. 30 tablet 2     No facility-administered encounter medications on file as of 07/14/2019.      Induction agent : alemtuzumab    CURRENT IMMUNOSUPPRESSION: Envarsus 8 mg daily  prograf/Envarsus/cyclosporine goal: 8-10   myfortic540  mg PO bid    prednisone 5 mg daily (was on pred 10mg  pre transplant dose for ANCA vasculitis, decreased to 5 mg on 1/14)    Patient complains of headache and tremor    IMMUNOSUPPRESSION DRUG LEVELS:  Lab Results   Component Value Date    Tacrolimus, Trough 15.5 07/03/2019    Tacrolimus, Trough 4.4 (L) 06/26/2019    Tacrolimus, Trough 4.6 (L) 06/22/2019     No results found for: CYCLO  No results found for: EVEROLIMUS  No results found for: SIROLIMUS    Envarsus level is inaccurate, patient took medication at 9 am yesterday (level is 1.5 hour late)    Graft function: stable  DSA: ntd  Biopsies to date: zero hour biopsy with minimal arteriosclerosis   -06/19/19: negative for rejection; non pathologic abnormality identified  UPC: 0.16 on 07/14/19  WBC/ANC:  wnl    Plan: Will maintain current immunosuppression pending Envarsus level today. Consider decreasing prednisone in the future if graft function remains stable. Repeat DSA panel pending today. Continue to monitor    OI Prophylaxis:   CMV Status: D+/ R+, moderate risk . CMV prophylaxis: valganciclovir 450 mg daily x 3 months per protocol (end 07/29/19).  Estimated Creatinine Clearance: 57 mL/min (A) (based on SCr of 1.74 mg/dL (H)).  No results found for: CMVCP  PCP Prophylaxis: bactrim SS 1 tab MWF x 6 months (end 10/26/19).  Thrush: completed in hospital  Patient is  tolerating infectious prophylaxis well    Plan: Continue per protocol. Continue to monitor.    CV Prophylaxis: asa 81 mg   The ASCVD Risk score Denman George DC Montez Hageman, et al., 2013) failed to calculate.  Statin therapy: Not indicated; currently on no statin  Plan:  Continue to monitor     BP: Goal < 140/90. Clinic vitals reported above.  Reports self adjusting hydralazine and clonidine to  BID given low BP readings midday (90s systolic)  Home BP ranges: 115-120s/80-85s  Current meds include: amlodipine 10 mg daily, hydralazine 100 mg BID, clonidine 0.1 mg BID   Plan: within goal. Continue, if BP remain stable consider weaning off clonidine at next visit and replace with chlorthalidone vs ACEI. Continue to monitor    Anemia of CKD:  H/H:   Lab Results   Component Value Date    HGB 10.0 (L) 07/13/2019     Lab Results   Component Value Date    HCT 33.7 (L) 07/14/2019     Iron panel:  No results found for: IRON, TIBC, FERRITIN  No results found for: LABIRON    Prior ESA use: none post transplant    Plan: stable. Continue to monitor.     DM:   Lab Results   Component Value Date    A1C <4.0 (L) 04/16/2019   . Goal A1c < 7  History of Dm? No  Diet:did not address  Exercise: yes - daily reports some joint pain with this  Plan:  Continue to monitor     Fluid intake: 64 oz daily    Electrolytes: wnl  Meds currently on: mg plus protein to 133 mg BID  Plan:Decrease Mg plus protein to 133 mg daily. Continue to monitor     GI/BM: pt reports no diarrhea; has normal BM; has occasional reflux  Meds currently on: docusate BID, omeprazole 20 mg BID (had been on once daily PPI but had breakthrough reflux on once daily)  Plan: Continue to monitor    Pain: pt reports diffuse joint pain that he attributes to excercising, still has some inguinal pain that has transitioned to numbness however wants to come off gabapentin to reduce pill burden as does not feel this is helping  Meds currently on: APAP PRN, gabapentin 300 mg TID (in pill box as 200/300/200)  Plan:. Decrease gabapentin to 200 mg TID x3 days f/b 100 mg TID x3 days then stop. Continue APAP prn. Continue to monitor    Bone health:   Vitamin D Level: 14.6 on 06/11/19. Goal > 30.   Last DEXA results:  none available  Current meds include: ergocalciferol 50,000 units wekkly (patient reports not taking)  Plan: Vitamin D level  out of goal; instructed patient to start taking ergocalciferol 50,000 units weekly x8 weeks as prescribed then transition to 2,000 units daily. Continue to monitor.     Women's/Men's Health:  DAUD CAYER is a 34 y.o. male. Patient reports no men's/women's health issues  Plan: Continue to monitor    Anxiety: still endorses anxiety and panic attacks  Meds currently on: escitalopram 15 mg daily, alprazolam 0.5 mg BID PRN (uses 4-5x/week), buspirone 15 mg BID (started by PCP recently in attempt to wean of benzo)   **Reports smoking vs edible marijuana daily  Plan: Counseled on risks of marijuana use including infection concerns + DDI with tacrolimus. Continue buspirone, escitalopram, and alprazolam as above. Continue to monitor.    Pharmacy preference:  [facility]    Adherence: Patient has average understanding of medications; was able to independently identify names/doses of immunosuppressants and OI meds.  Patient  does fill their own pill box on a regular basis at home.  His wife also helps with meds but he is filling his pill box independently.   Patient brought medication card:no  Pill box:did not bring   Plan: Instructed patient that he shouldn't make med changes without contacting [facility] and that any new  meds from other providers need to be approved by transplant team.  Provided extensive adherence counseling/intervention    Spent approximately 20 minutes on educating this patient and greater than 50% was spent in direct face to face counseling regarding post transplant medication education. Questions and concerns were address to patient's satisfaction.    Patient was reviewed with Darolyn Rua, AGNP/Dr. Nestor Lewandowsky who was agreement with the stated plan:     During this visit, the following was completed:   BG log data assessment  BP log data assessment  Labs ordered and evaluated  complex treatment plan >1 DS   Patient education was completed for 11-24 minutes     All questions/concerns were addressed to the patient's satisfaction.    Jonette Pesa, PharmD  PGY2 Solid Organ Transplant Pharmacy Resident  __________________________________________  Cleone Slim, PHARMD, CPP  SOLID ORGAN TRANSPLANT CLINICAL PHARMACIST PRACTITIONER  PAGER [phone]

## 2019-07-14 NOTE — Unmapped (Signed)
Urine was collected and sent to the lab.

## 2019-07-14 NOTE — Unmapped (Signed)
Saw patient in clinic with NP.  Patient reports he is doing well. Still having anxiety, requiring xanax. Taking 4-5 week. Smoking marijuana daily and eating edibles.      Complains of joint pain (knees, shoulders, elbows for the past week.  Has started yoga/cardio to build strength    Still has numbness to RL abdomen, right thigh and groin area    HA in the back of head and in the middle of the night and afternoon.    Denies CP, heart arrhythmias,abdominal pain, n/v/d, swelling, fevers, or urinary complaints.    BP avg 120/80    SOB at rest, increases with anxiety    +tremors, worse in the afternoons when he usually takes his xanax.    Wants to decrease gabapentin (pharmacy to dose and teach patient)    Drinking about 6 16.9 ounce bottles of water a day    Took envarsus at 9am yesterday and will get labs after apt today

## 2019-07-14 NOTE — Unmapped (Addendum)
[facility] Refill Coordination Note    Specialty Medication(s) to be Shipped:   Transplant: Envarsus 4mg , Envarsus 1mg ,  mycophenolic acid 180mg , valgancyclovir 450mg  and Prednisone 5mg        Other medication(s) to be shipped:   Vitmain D  Mag 133mg   Aspirin     **Envarsus dose increase to 10mg      Ryan Moon, DOB: [birth year]  Phone: [phone] (home)       All above HIPAA information was verified with patient.     Was a Nurse, learning disability used for this call? No    Completed refill call assessment today to schedule patient's medication shipment from the [facility] [phone]).       Specialty medication(s) and dose(s) confirmed: Envarsus dose increase to 10mg    Changes to medications: Gerhart reports no changes at this time.  Changes to insurance: No  Questions for the pharmacist: No    Confirmed patient received Welcome Packet with first shipment. The patient will receive a drug information handout for each medication shipped and additional FDA Medication Guides as required.       DISEASE/MEDICATION-SPECIFIC INFORMATION        N/A    SPECIALTY MEDICATION ADHERENCE     Medication Adherence    Patient reported X missed doses in the last month: 0         Envarsus 4mg : 10 days worth of medication on hand.  mycophenolic acid 180mg : 10 days worth of medication on hand.  valgancyclovir 450mg : 10 days worth of medication on hand.  Prednisone 5mg : 10 days worth of medication on hand.  Envarsus 1mg : 10 days worth of medication on hand.              SHIPPING     Shipping address confirmed in Epic.     Delivery Scheduled: Yes, Expected medication delivery date: 07/21/19.     Medication will be delivered via UPS to the prescription address in Epic WAM.    Ryan Moon   [facility] Specialty Technician

## 2019-07-15 DIAGNOSIS — Z940 Kidney transplant status: Principal | ICD-10-CM

## 2019-07-15 LAB — TACROLIMUS LEVEL: Tacrolimus (FK506) - LabCorp: 9.4 ng/mL (ref 2.0–20.0)

## 2019-07-15 LAB — CBC W/ DIFFERENTIAL
BASOPHILS ABSOLUTE COUNT: 0 10*9/L
BASOPHILS RELATIVE PERCENT: 1 %
EOSINOPHILS ABSOLUTE COUNT: 0.1 10*9/L
EOSINOPHILS RELATIVE PERCENT: 2 %
HEMATOCRIT: 30.5 % — ABNORMAL LOW
HEMOGLOBIN: 9.8 g/dL — ABNORMAL LOW
LYMPHOCYTES ABSOLUTE COUNT: 0.1 10*9/L — ABNORMAL LOW
LYMPHOCYTES RELATIVE PERCENT: 2 %
MEAN CORPUSCULAR HEMOGLOBIN CONC: 32.1 g/dL
MEAN CORPUSCULAR VOLUME: 98.4 fL
MONOCYTES ABSOLUTE COUNT: 0.4 10*9/L
MONOCYTES RELATIVE PERCENT: 10 %
NEUTROPHILS ABSOLUTE COUNT: 3.7 10*9/L
NEUTROPHILS RELATIVE PERCENT: 84 %
PLATELET COUNT: 243 10*9/L
RED BLOOD CELL COUNT: 3.1 10*12/L — ABNORMAL LOW
RED CELL DISTRIBUTION WIDTH: 16.5 % — ABNORMAL HIGH
WBC ADJUSTED: 4.3 10*9/L

## 2019-07-15 LAB — MAGNESIUM: Lab: 1.7

## 2019-07-15 LAB — BASIC METABOLIC PANEL
BLOOD UREA NITROGEN: 31 mg/dL — ABNORMAL HIGH
CALCIUM: 9.4 mg/dL
CHLORIDE: 108 mmol/L
CO2: 23 mmol/L
CREATININE: 1.65 mg/dL — ABNORMAL HIGH
EGFR CKD-EPI NON-AA MALE: 53 mL/min/{1.73_m2} — ABNORMAL LOW
GLUCOSE RANDOM: 115 mg/dL — ABNORMAL HIGH
SODIUM: 141 mmol/L

## 2019-07-15 LAB — MEAN CORPUSCULAR HEMOGLOBIN CONC: Lab: 32.1

## 2019-07-15 LAB — CHLORIDE: Lab: 108

## 2019-07-15 LAB — PHOSPHORUS: Lab: 3.6

## 2019-07-15 MED ORDER — TACROLIMUS XR 1 MG TABLET,EXTENDED RELEASE 24 HR
ORAL_TABLET | Freq: Every day | ORAL | 11 refills | 30.00000 days | Status: CP
Start: 2019-07-15 — End: 2020-07-14
  Filled 2019-07-20: qty 60, 30d supply, fill #0

## 2019-07-15 MED ORDER — TACROLIMUS XR 4 MG TABLET,EXTENDED RELEASE 24 HR
ORAL_TABLET | Freq: Every day | ORAL | 11 refills | 30 days | Status: CP
Start: 2019-07-15 — End: 2020-07-14

## 2019-07-15 MED ORDER — MG-PLUS-PROTEIN 133 MG TABLET
ORAL_TABLET | Freq: Every day | ORAL | 11 refills | 100 days | Status: CP
Start: 2019-07-15 — End: ?
  Filled 2019-07-20: qty 100, 100d supply, fill #0

## 2019-07-15 NOTE — Unmapped (Signed)
Reviewed tac level with Dr. Farrel Gobble, increasing Envarsus to 10 mg daily. Pt made aware, verbalized understanding. Updated prescription sent to [facility].     Lab draws decreased to 2 x per week.

## 2019-07-16 NOTE — Unmapped (Signed)
Change in Envarsus dosage increase from 7 mg to 10 mg. Co-pay $0.00. Previous dose was scheduled to fill 07/20/2019. New dose needs to be scheduled.

## 2019-07-20 ENCOUNTER — Other Ambulatory Visit
Admission: RE | Admit: 2019-07-20 | Discharge: 2019-07-20 | Disposition: A | Discharge status: Home / Self Care | Payer: BLUE CROSS/BLUE SHIELD | Source: Ambulatory Visit | Attending: Nephrology | Admitting: Nephrology

## 2019-07-20 DIAGNOSIS — B259 Cytomegaloviral disease, unspecified: Secondary | ICD-10-CM | POA: Insufficient documentation

## 2019-07-20 DIAGNOSIS — Z940 Kidney transplant status: Secondary | ICD-10-CM | POA: Diagnosis not present

## 2019-07-20 DIAGNOSIS — D631 Anemia in chronic kidney disease: Secondary | ICD-10-CM | POA: Diagnosis not present

## 2019-07-20 DIAGNOSIS — Z9483 Pancreas transplant status: Secondary | ICD-10-CM | POA: Insufficient documentation

## 2019-07-20 DIAGNOSIS — D899 Disorder involving the immune mechanism, unspecified: Secondary | ICD-10-CM | POA: Diagnosis not present

## 2019-07-20 DIAGNOSIS — Z79899 Other long term (current) drug therapy: Secondary | ICD-10-CM | POA: Insufficient documentation

## 2019-07-20 DIAGNOSIS — Z114 Encounter for screening for human immunodeficiency virus [HIV]: Secondary | ICD-10-CM | POA: Insufficient documentation

## 2019-07-20 DIAGNOSIS — Z09 Encounter for follow-up examination after completed treatment for conditions other than malignant neoplasm: Secondary | ICD-10-CM | POA: Diagnosis not present

## 2019-07-20 DIAGNOSIS — N390 Urinary tract infection, site not specified: Secondary | ICD-10-CM | POA: Insufficient documentation

## 2019-07-20 LAB — CBC WITH DIFFERENTIAL/PLATELET
Abs Immature Granulocytes: 0.23 10*3/uL — ABNORMAL HIGH (ref 0.00–0.07)
Basophils Absolute: 0 10*3/uL (ref 0.0–0.1)
Basophils Relative: 1 %
Eosinophils Absolute: 0 10*3/uL (ref 0.0–0.5)
Eosinophils Relative: 1 %
HCT: 30.9 % — ABNORMAL LOW (ref 39.0–52.0)
Hemoglobin: 9.9 g/dL — ABNORMAL LOW (ref 13.0–17.0)
Immature Granulocytes: 7 %
Lymphocytes Relative: 5 %
Lymphs Abs: 0.1 10*3/uL — ABNORMAL LOW (ref 0.7–4.0)
MCH: 31.7 pg (ref 26.0–34.0)
MCHC: 32 g/dL (ref 30.0–36.0)
MCV: 99 fL (ref 80.0–100.0)
Monocytes Absolute: 0.3 10*3/uL (ref 0.1–1.0)
Monocytes Relative: 10 %
Neutro Abs: 2.4 10*3/uL (ref 1.7–7.7)
Neutrophils Relative %: 76 %
Platelets: 213 10*3/uL (ref 150–400)
RBC: 3.12 MIL/uL — ABNORMAL LOW (ref 4.22–5.81)
RDW: 15.6 % — ABNORMAL HIGH (ref 11.5–15.5)
WBC: 3.1 10*3/uL — ABNORMAL LOW (ref 4.0–10.5)
nRBC: 0 % (ref 0.0–0.2)

## 2019-07-20 LAB — BASIC METABOLIC PANEL
Anion gap: 8 (ref 5–15)
BUN: 31 mg/dL — ABNORMAL HIGH (ref 6–20)
CO2: 23 mmol/L (ref 22–32)
Calcium: 9.3 mg/dL (ref 8.9–10.3)
Chloride: 108 mmol/L (ref 98–111)
Creatinine, Ser: 1.71 mg/dL — ABNORMAL HIGH (ref 0.61–1.24)
GFR calc Af Amer: 59 mL/min — ABNORMAL LOW (ref >60)
GFR calc non Af Amer: 51 mL/min — ABNORMAL LOW (ref >60)
Glucose, Bld: 93 mg/dL (ref 70–99)
Potassium: 4.1 mmol/L (ref 3.5–5.1)
Sodium: 139 mmol/L (ref 135–145)

## 2019-07-20 LAB — PHOSPHORUS: Phosphorus: 3.3 mg/dL (ref 2.5–4.6)

## 2019-07-20 LAB — MAGNESIUM: Magnesium: 1.8 mg/dL (ref 1.7–2.4)

## 2019-07-20 LAB — HLA DS POST TRANSPLANT
ANTI-DONOR DRW #2 MFI: 65 MFI
ANTI-DONOR HLA-A #1 MFI: 0 MFI
ANTI-DONOR HLA-A #2 MFI: 149 MFI
ANTI-DONOR HLA-B #1 MFI: 7 MFI
ANTI-DONOR HLA-C #1 MFI: 11 MFI
ANTI-DONOR HLA-C #2 MFI: 0 MFI
ANTI-DONOR HLA-DP AG #1 MFI: 21 MFI
ANTI-DONOR HLA-DQB #1 MFI: 61 MFI
ANTI-DONOR HLA-DQB #2 MFI: 61 MFI
ANTI-DONOR HLA-DR #1 MFI: 23 MFI
ANTI-DONOR HLA-DR #2 MFI: 99 MFI

## 2019-07-20 LAB — FSAB CLASS 2 ANTIBODY SPECIFICITY

## 2019-07-20 LAB — HLA CL2 AB COMMENT: Lab: 0

## 2019-07-20 LAB — ANTI-DONOR HLA-C #2 MFI: Lab: 0

## 2019-07-20 LAB — HLA CL1 ANTIBODY COMM: Lab: 0

## 2019-07-20 MED FILL — ASPIRIN 81 MG TABLET,DELAYED RELEASE: ORAL | 30 days supply | Qty: 30 | Fill #2

## 2019-07-20 MED FILL — PREDNISONE 5 MG TABLET: 30 days supply | Qty: 30 | Fill #1 | Status: AC

## 2019-07-20 MED FILL — PREDNISONE 5 MG TABLET: ORAL | 30 days supply | Qty: 30 | Fill #1

## 2019-07-20 MED FILL — MYCOPHENOLATE SODIUM 180 MG TABLET,DELAYED RELEASE: 30 days supply | Qty: 180 | Fill #2 | Status: AC

## 2019-07-20 MED FILL — ERGOCALCIFEROL (VITAMIN D2) 1,250 MCG (50,000 UNIT) CAPSULE: 28 days supply | Qty: 4 | Fill #1 | Status: AC

## 2019-07-20 MED FILL — ENVARSUS XR 1 MG TABLET,EXTENDED RELEASE: 30 days supply | Qty: 60 | Fill #0 | Status: AC

## 2019-07-20 MED FILL — ERGOCALCIFEROL (VITAMIN D2) 1,250 MCG (50,000 UNIT) CAPSULE: ORAL | 28 days supply | Qty: 4 | Fill #1

## 2019-07-20 MED FILL — MG-PLUS-PROTEIN 133 MG TABLET: 100 days supply | Qty: 100 | Fill #0 | Status: AC

## 2019-07-20 MED FILL — MYCOPHENOLATE SODIUM 180 MG TABLET,DELAYED RELEASE: ORAL | 30 days supply | Qty: 180 | Fill #2

## 2019-07-20 MED FILL — ENVARSUS XR 4 MG TABLET,EXTENDED RELEASE: 30 days supply | Qty: 60 | Fill #0 | Status: AC

## 2019-07-20 MED FILL — ENVARSUS XR 4 MG TABLET,EXTENDED RELEASE: ORAL | 30 days supply | Qty: 60 | Fill #0

## 2019-07-20 MED FILL — ASPIRIN 81 MG TABLET,DELAYED RELEASE: 30 days supply | Qty: 30 | Fill #2 | Status: AC

## 2019-07-21 LAB — TACROLIMUS LEVEL: Tacrolimus (FK506) - LabCorp: 11.4 ng/mL (ref 2.0–20.0)

## 2019-07-21 LAB — MAGNESIUM: Lab: 1.8

## 2019-07-21 LAB — CBC W/ DIFFERENTIAL
BASOPHILS ABSOLUTE COUNT: 0 10*9/L
BASOPHILS RELATIVE PERCENT: 1 %
EOSINOPHILS RELATIVE PERCENT: 1 %
HEMATOCRIT: 30.9 %
HEMOGLOBIN: 9.9 g/dL
LYMPHOCYTES ABSOLUTE COUNT: 0.1 10*9/L — ABNORMAL LOW
MEAN CORPUSCULAR HEMOGLOBIN CONC: 32 g/dL
MEAN CORPUSCULAR HEMOGLOBIN: 31.7 pg
MEAN CORPUSCULAR VOLUME: 99 fL
MONOCYTES ABSOLUTE COUNT: 0.3 10*9/L
MONOCYTES RELATIVE PERCENT: 10 %
NEUTROPHILS ABSOLUTE COUNT: 2.4 10*9/L
NEUTROPHILS RELATIVE PERCENT: 76 %
PLATELET COUNT: 213 10*9/L
RED BLOOD CELL COUNT: 3.12 10*12/L — ABNORMAL LOW
RED CELL DISTRIBUTION WIDTH: 15.6 % — ABNORMAL HIGH
WHITE BLOOD CELL COUNT: 3.1 10*9/L — ABNORMAL LOW

## 2019-07-21 LAB — BASIC METABOLIC PANEL
BLOOD UREA NITROGEN: 31 mg/dL — ABNORMAL HIGH
CALCIUM: 9.3 mg/dL
CO2: 23 mmol/L
CREATININE: 1.71 mg/dL — ABNORMAL HIGH
EGFR CKD-EPI AA MALE: 59 mL/min/{1.73_m2} — ABNORMAL LOW
EGFR CKD-EPI NON-AA MALE: 51 mL/min/{1.73_m2} — ABNORMAL LOW
GLUCOSE RANDOM: 93 mg/dL
POTASSIUM: 4.1 mmol/L

## 2019-07-21 LAB — CHLORIDE: Lab: 108

## 2019-07-21 LAB — MEAN CORPUSCULAR VOLUME: Lab: 99

## 2019-07-21 LAB — PHOSPHORUS: Lab: 3.3

## 2019-07-21 MED ORDER — HYDRALAZINE 100 MG TABLET
ORAL_TABLET | Freq: Two times a day (BID) | ORAL | 11 refills | 30.00000 days | Status: CP
Start: 2019-07-21 — End: 2020-07-15

## 2019-07-21 MED FILL — VALGANCICLOVIR 450 MG TABLET: 15 days supply | Qty: 15 | Fill #0 | Status: AC

## 2019-07-21 NOTE — Unmapped (Signed)
Ryan Moon 's Valganciclovir shipment will be delayed as a result of the medication is too soon to refill until 07/21/2019.     I have reached out to the patient and communicated the delay. We will reschedule the medication for the delivery date that the patient agreed upon.  We via ups. 07/21/2019

## 2019-07-24 ENCOUNTER — Other Ambulatory Visit
Admission: RE | Admit: 2019-07-24 | Discharge: 2019-07-24 | Disposition: A | Discharge status: Home / Self Care | Payer: BLUE CROSS/BLUE SHIELD | Source: Ambulatory Visit | Attending: Nephrology | Admitting: Nephrology

## 2019-07-24 DIAGNOSIS — E559 Vitamin D deficiency, unspecified: Secondary | ICD-10-CM | POA: Diagnosis not present

## 2019-07-24 DIAGNOSIS — Z940 Kidney transplant status: Secondary | ICD-10-CM | POA: Insufficient documentation

## 2019-07-24 DIAGNOSIS — Z79899 Other long term (current) drug therapy: Secondary | ICD-10-CM | POA: Insufficient documentation

## 2019-07-24 DIAGNOSIS — D899 Disorder involving the immune mechanism, unspecified: Secondary | ICD-10-CM | POA: Diagnosis not present

## 2019-07-24 DIAGNOSIS — N390 Urinary tract infection, site not specified: Secondary | ICD-10-CM | POA: Insufficient documentation

## 2019-07-24 DIAGNOSIS — Z114 Encounter for screening for human immunodeficiency virus [HIV]: Secondary | ICD-10-CM | POA: Insufficient documentation

## 2019-07-24 DIAGNOSIS — B259 Cytomegaloviral disease, unspecified: Secondary | ICD-10-CM | POA: Insufficient documentation

## 2019-07-24 DIAGNOSIS — E1129 Type 2 diabetes mellitus with other diabetic kidney complication: Secondary | ICD-10-CM | POA: Insufficient documentation

## 2019-07-24 DIAGNOSIS — D631 Anemia in chronic kidney disease: Secondary | ICD-10-CM | POA: Diagnosis not present

## 2019-07-24 DIAGNOSIS — Z9483 Pancreas transplant status: Secondary | ICD-10-CM | POA: Diagnosis not present

## 2019-07-24 DIAGNOSIS — Z09 Encounter for follow-up examination after completed treatment for conditions other than malignant neoplasm: Secondary | ICD-10-CM | POA: Diagnosis not present

## 2019-07-24 DIAGNOSIS — Z789 Other specified health status: Secondary | ICD-10-CM | POA: Diagnosis not present

## 2019-07-24 LAB — CBC WITH DIFFERENTIAL/PLATELET
Abs Immature Granulocytes: 0.29 10*3/uL — ABNORMAL HIGH (ref 0.00–0.07)
Basophils Absolute: 0 10*3/uL (ref 0.0–0.1)
Basophils Relative: 0 %
Eosinophils Absolute: 0.1 10*3/uL (ref 0.0–0.5)
Eosinophils Relative: 2 %
HCT: 30.2 % — ABNORMAL LOW (ref 39.0–52.0)
Hemoglobin: 9.6 g/dL — ABNORMAL LOW (ref 13.0–17.0)
Immature Granulocytes: 11 %
Lymphocytes Relative: 5 %
Lymphs Abs: 0.1 10*3/uL — ABNORMAL LOW (ref 0.7–4.0)
MCH: 31.8 pg (ref 26.0–34.0)
MCHC: 31.8 g/dL (ref 30.0–36.0)
MCV: 100 fL (ref 80.0–100.0)
Monocytes Absolute: 0.2 10*3/uL (ref 0.1–1.0)
Monocytes Relative: 8 %
Neutro Abs: 1.9 10*3/uL (ref 1.7–7.7)
Neutrophils Relative %: 74 %
Platelets: 193 10*3/uL (ref 150–400)
RBC: 3.02 MIL/uL — ABNORMAL LOW (ref 4.22–5.81)
RDW: 15.2 % (ref 11.5–15.5)
Smear Review: NORMAL
WBC: 2.6 10*3/uL — ABNORMAL LOW (ref 4.0–10.5)
nRBC: 0 % (ref 0.0–0.2)

## 2019-07-24 LAB — BASIC METABOLIC PANEL
Anion gap: 8 (ref 5–15)
BUN: 32 mg/dL — ABNORMAL HIGH (ref 6–20)
CO2: 23 mmol/L (ref 22–32)
Calcium: 9.1 mg/dL (ref 8.9–10.3)
Chloride: 107 mmol/L (ref 98–111)
Creatinine, Ser: 1.74 mg/dL — ABNORMAL HIGH (ref 0.61–1.24)
GFR calc Af Amer: 58 mL/min — ABNORMAL LOW (ref >60)
GFR calc non Af Amer: 50 mL/min — ABNORMAL LOW (ref >60)
Glucose, Bld: 108 mg/dL — ABNORMAL HIGH (ref 70–99)
Potassium: 3.9 mmol/L (ref 3.5–5.1)
Sodium: 138 mmol/L (ref 135–145)

## 2019-07-24 LAB — PHOSPHORUS: Phosphorus: 3.6 mg/dL (ref 2.5–4.6)

## 2019-07-24 LAB — MAGNESIUM: Magnesium: 1.8 mg/dL (ref 1.7–2.4)

## 2019-07-26 LAB — TACROLIMUS LEVEL: Tacrolimus (FK506) - LabCorp: 13.1 ng/mL (ref 2.0–20.0)

## 2019-07-27 ENCOUNTER — Other Ambulatory Visit
Admission: RE | Admit: 2019-07-27 | Discharge: 2019-07-27 | Disposition: A | Discharge status: Home / Self Care | Payer: BLUE CROSS/BLUE SHIELD | Source: Ambulatory Visit | Attending: Nephrology | Admitting: Nephrology

## 2019-07-27 DIAGNOSIS — Z940 Kidney transplant status: Principal | ICD-10-CM

## 2019-07-27 DIAGNOSIS — Z79899 Other long term (current) drug therapy: Principal | ICD-10-CM

## 2019-07-27 DIAGNOSIS — Z789 Other specified health status: Secondary | ICD-10-CM | POA: Insufficient documentation

## 2019-07-27 DIAGNOSIS — D631 Anemia in chronic kidney disease: Secondary | ICD-10-CM | POA: Insufficient documentation

## 2019-07-27 DIAGNOSIS — D899 Disorder involving the immune mechanism, unspecified: Secondary | ICD-10-CM | POA: Diagnosis not present

## 2019-07-27 DIAGNOSIS — Z114 Encounter for screening for human immunodeficiency virus [HIV]: Secondary | ICD-10-CM | POA: Insufficient documentation

## 2019-07-27 DIAGNOSIS — N390 Urinary tract infection, site not specified: Secondary | ICD-10-CM | POA: Insufficient documentation

## 2019-07-27 DIAGNOSIS — B259 Cytomegaloviral disease, unspecified: Secondary | ICD-10-CM | POA: Insufficient documentation

## 2019-07-27 DIAGNOSIS — Z09 Encounter for follow-up examination after completed treatment for conditions other than malignant neoplasm: Secondary | ICD-10-CM | POA: Insufficient documentation

## 2019-07-27 DIAGNOSIS — E559 Vitamin D deficiency, unspecified: Secondary | ICD-10-CM | POA: Insufficient documentation

## 2019-07-27 DIAGNOSIS — Z9483 Pancreas transplant status: Secondary | ICD-10-CM | POA: Insufficient documentation

## 2019-07-27 DIAGNOSIS — E1129 Type 2 diabetes mellitus with other diabetic kidney complication: Secondary | ICD-10-CM | POA: Diagnosis not present

## 2019-07-27 LAB — CBC WITH DIFFERENTIAL/PLATELET
Abs Immature Granulocytes: 0 10*3/uL (ref 0.00–0.07)
Basophils Absolute: 0 10*3/uL (ref 0.0–0.1)
Basophils Relative: 0 %
Eosinophils Absolute: 0.1 10*3/uL (ref 0.0–0.5)
Eosinophils Relative: 6 %
HCT: 29.7 % — ABNORMAL LOW (ref 39.0–52.0)
Hemoglobin: 9.2 g/dL — ABNORMAL LOW (ref 13.0–17.0)
Lymphocytes Relative: 8 %
Lymphs Abs: 0.1 10*3/uL — ABNORMAL LOW (ref 0.7–4.0)
MCH: 31.2 pg (ref 26.0–34.0)
MCHC: 31 g/dL (ref 30.0–36.0)
MCV: 100.7 fL — ABNORMAL HIGH (ref 80.0–100.0)
Monocytes Absolute: 0.2 10*3/uL (ref 0.1–1.0)
Monocytes Relative: 12 %
Neutro Abs: 1 10*3/uL — ABNORMAL LOW (ref 1.7–7.7)
Neutrophils Relative %: 74 %
Platelets: 177 10*3/uL (ref 150–400)
RBC: 2.95 MIL/uL — ABNORMAL LOW (ref 4.22–5.81)
RDW: 14.8 % (ref 11.5–15.5)
WBC: 1.4 10*3/uL — CL (ref 4.0–10.5)
nRBC: 0 % (ref 0.0–0.2)

## 2019-07-27 LAB — BASIC METABOLIC PANEL
Anion gap: 9 (ref 5–15)
BUN: 30 mg/dL — ABNORMAL HIGH (ref 6–20)
CO2: 24 mmol/L (ref 22–32)
Calcium: 9.1 mg/dL (ref 8.9–10.3)
Chloride: 107 mmol/L (ref 98–111)
Creatinine, Ser: 1.83 mg/dL — ABNORMAL HIGH (ref 0.61–1.24)
GFR calc Af Amer: 54 mL/min — ABNORMAL LOW (ref >60)
GFR calc non Af Amer: 47 mL/min — ABNORMAL LOW (ref >60)
Glucose, Bld: 92 mg/dL (ref 70–99)
Potassium: 4.3 mmol/L (ref 3.5–5.1)
Sodium: 140 mmol/L (ref 135–145)

## 2019-07-27 LAB — PHOSPHORUS: Phosphorus: 3.5 mg/dL (ref 2.5–4.6)

## 2019-07-27 LAB — MAGNESIUM: Magnesium: 1.7 mg/dL (ref 1.7–2.4)

## 2019-07-27 MED ORDER — MYCOPHENOLATE SODIUM 180 MG TABLET,DELAYED RELEASE
ORAL_TABLET | Freq: Two times a day (BID) | ORAL | 11 refills | 30 days | Status: CP
Start: 2019-07-27 — End: 2020-07-26

## 2019-07-27 NOTE — Unmapped (Signed)
Patient referred for: COVID [facility]    Outcome: Scheduled visit.    Scheduling Notes: Covid test

## 2019-07-27 NOTE — Unmapped (Signed)
Patient referred for: COVID - [facility]    Outcome: Left voicemail. Advised to call back.    Scheduling Notes: Per Dr. Toni Arthurs, please call and schedule patient for COVID-19 testing, preferrably at the [facility]. Pt recently traveled out of state and has new cough, sore throat and congestion and is immune compromised due to kidney transplant.

## 2019-07-27 NOTE — Unmapped (Signed)
Critical lab value from [facility], WBC 1.4, per Dr. Toni Arthurs decreasing Myfortic to 360 BID. Pt made aware, will get repeat labs on Thursday. Updated prescription sent to [facility].     While speaking with patient, his wife states over the weekend he has had a cough, nasal congestion and sore throat. Reviewed with Dr. Toni Arthurs, pt needs to be scheduled for COVID-19 test. Pt made aware, message sent to [facility] COVID-19 schedulers.

## 2019-07-27 NOTE — Unmapped (Signed)
Change in Mycophenolate dosage decrease. Refill too soon until 08/16/2019. Will attempt test claim on 08/17/2019 date.

## 2019-07-28 ENCOUNTER — Encounter: Admit: 2019-07-28 | Discharge: 2019-07-29 | Payer: PRIVATE HEALTH INSURANCE | Attending: Family | Primary: Family

## 2019-07-28 NOTE — Unmapped (Signed)
Assessment     Ryan Moon is a 34 y.o. male presenting to [facility] for COVID testing.     Problem List Items Addressed This Visit     None      Visit Diagnoses     Contact with and (suspected) exposure to covid-19    -  Primary    Relevant Orders    COVID-19 PCR          Plan     If no testing performed, pt counseled on routine care for respiratory illness.  If testing performed, COVID sent.  Patient directed to Home given findings during today's visit.    Subjective     Ryan Moon is a 34 y.o. male who [facility] with complaints of the following:    Exposure History: In the last 21 days?     Have you traveled outside of [place]? No               Have you been in close contact with someone confirmed by a test to have COVID? (Close contact is within 6 feet for at least 10 minutes) No       Have you worked in a health care facility? No     Lived or worked facility like a nursing home, group home, or assisted living?    No         Are you scheduled to have surgery or a procedure in the next 3 days? No               Are you scheduled to receive cancer chemotherapy within the next 7 days?    No     Have you ever been tested before for COVID-19 with a swab of your nose? 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         Right now,  do you have any of the following that developed over the past 7 days (as stated by patient on intake form):    Subjective fever (felt feverish) No   Chills (especially repeated shaking chills) No   Severe fatigue (felt very tired) No   Muscle aches No   Runny nose No   Sore throat No   Loss of taste or smell No   Cough (new onset or worsening of chronic cough) No   Shortness of breath No   Nausea or vomiting No   Headache No   Abdominal Pain No   Diarrhea (3 or more loose stools in last 24 hours) No     History/Medical Conditions (as stated by patient on intake form):    Do you have any of the following:   Asthma or emphysema or COPD No   Cystic Fibrosis No   Diabetes No   High Blood Pressure  Yes   Cardiovascular Disease No   Chronic Kidney Disease No, Yes   Chronic Liver Disease No   Chronic blood disorder like Sickle Cell Disease  No   Weak immune system due to disease or medication Yes   Neurologic condition that limits movement  No   Developmental delay - Moderate to Severe  No   Recent (within past 2 weeks) or current Pregnancy No   Morbid Obesity (>100 pounds over ideal weight) No   Current Smoker No   Former Smoker Yes       Objective  Given above, testing performed: Yes    Testing Performed:  Test Specimen Type Sent to   COVID-19  [facility]       Scribe's Attestation: Nishawn Rotan L. Lorin Picket, FNP obtained and performed the history, physical exam and medical decision making elements that were entered into the chart.  Signed by Edison Simon serving as Scribe, on 07/28/2019 9:35 AM      The documentation recorded by the scribe accurately reflects the service I personally performed and the decisions made by me. Aida Puffer, FNP  July 29, 2019 12:54 PM

## 2019-07-29 LAB — TACROLIMUS LEVEL: Tacrolimus (FK506) - LabCorp: 11.6 ng/mL (ref 2.0–20.0)

## 2019-07-29 LAB — BASIC METABOLIC PANEL
BLOOD UREA NITROGEN: 32 mg/dL — ABNORMAL HIGH
CALCIUM: 9.1 mg/dL
CHLORIDE: 107 mmol/L
CO2: 23 mmol/L
CREATININE: 1.74 mg/dL — ABNORMAL HIGH
EGFR CKD-EPI NON-AA MALE: 50 mL/min/{1.73_m2} — ABNORMAL LOW
GLUCOSE RANDOM: 108 mg/dL — ABNORMAL HIGH
POTASSIUM: 3.9 mmol/L
SODIUM: 138 mmol/L

## 2019-07-29 LAB — CBC W/ DIFFERENTIAL
BASOPHILS ABSOLUTE COUNT: 0 10*9/L
BASOPHILS RELATIVE PERCENT: 0 %
EOSINOPHILS ABSOLUTE COUNT: 0.1 10*9/L
EOSINOPHILS RELATIVE PERCENT: 2 %
HEMOGLOBIN: 9.6 g/dL — ABNORMAL LOW
LYMPHOCYTES ABSOLUTE COUNT: 0.1 10*9/L — ABNORMAL LOW
LYMPHOCYTES RELATIVE PERCENT: 5 %
MEAN CORPUSCULAR HEMOGLOBIN CONC: 31.8 g/dL
MEAN CORPUSCULAR HEMOGLOBIN: 31.8 pg
MEAN CORPUSCULAR VOLUME: 100 fL
MONOCYTES ABSOLUTE COUNT: 0.2 10*9/L
MONOCYTES RELATIVE PERCENT: 8 %
NEUTROPHILS ABSOLUTE COUNT: 1.9 10*9/L
PLATELET COUNT: 193 10*9/L
RED BLOOD CELL COUNT: 3.02 10*12/L — ABNORMAL LOW
RED CELL DISTRIBUTION WIDTH: 15.2 %

## 2019-07-29 LAB — PHOSPHORUS: Lab: 3.6

## 2019-07-29 LAB — CHLORIDE: Lab: 107

## 2019-07-29 LAB — MAGNESIUM: Lab: 1.8

## 2019-07-29 LAB — MICROCYTES: Lab: 0

## 2019-07-30 ENCOUNTER — Encounter
Admit: 2019-07-30 | Discharge: 2019-07-31 | Payer: PRIVATE HEALTH INSURANCE | Attending: Nephrology | Primary: Nephrology

## 2019-07-30 LAB — PROTEIN / CREATININE RATIO, URINE
PROTEIN URINE: 19.5 mg/dL
PROTEIN/CREAT RATIO, URINE: 0.136

## 2019-07-30 LAB — URINALYSIS
BACTERIA: NONE SEEN /HPF
BILIRUBIN UA: NEGATIVE
BLOOD UA: NEGATIVE
GLUCOSE UA: NEGATIVE
HYALINE CASTS: 3 /LPF — ABNORMAL HIGH (ref 0–1)
KETONES UA: NEGATIVE
NITRITE UA: NEGATIVE
PH UA: 5 (ref 5.0–9.0)
PROTEIN UA: NEGATIVE
RBC UA: 1 /HPF (ref <=3)
SPECIFIC GRAVITY UA: 1.018 (ref 1.003–1.030)
SQUAMOUS EPITHELIAL: <1 /HPF (ref 0–5)
UROBILINOGEN UA: 0.2
WBC UA: 4 /HPF — ABNORMAL HIGH (ref <=2)

## 2019-07-30 LAB — PROTEIN URINE: Protein:MCnc:Pt:Urine:Qn:: 19.5

## 2019-07-30 LAB — BLOOD UA: Hemoglobin:PrThr:Pt:Urine:Ord:Test strip: NEGATIVE

## 2019-07-30 NOTE — Unmapped (Signed)
AOBP   Patient's blood pressure was taken on right upper arm, medium cuff.   1st reading 128/77, pulse: 72  2nd reading 123/76, pulse: 72  3rd reading 123/78, pulse: 72  Average reading 125/77, pulse: 72

## 2019-07-30 NOTE — Unmapped (Signed)
Assessment    Met w/ patient in [facility] today. Reviewed meds/symptoms. Any new medications? no                Pt reports no fever/cold/flu symptoms    BP: 125/77 today/ Home BP reported   BG: wnl   Denies Headache/Dizziness/Lightheaded:    Denies hand tremors: significant bilateral tremors   Denies numbness/tingling: - incisional numbness only   Denies fevers/chills/sweats   Denies SOB/palpatations, but reports intermittent R sided chest pain, possibly related to pain around kidney   Denies nausea/vomiting/heartburn: taking prilosec   Denies diarrhea/constipation: taking colace daily   UTI symptoms (burn/pain/itch/frequency/urgency/odor/color/foam): none   No visible or palpable edema    Appetite good; reports adequate hydration. 4 bottles of water daily    Pt reports being well rested and getting adequate exercise despite Covid 19 quarantine. Taking care to mask, hand hygeine and minimal public activity. Offered support and guidance for this process given his immune suppressed state.       Pain: deep at kidney site, 7/10, tylenol not helping   Incision: c/d/i   Intake: 4 bottles H20 daily   Output: adequate. Not waking in the night anymore    Last Envarsus taken 0900; NOT held for this morning's labs.    No other complaints or concerns.     Referrals needed: none    Pt Follow up w/ derm after 1 year    Immunization status: utd. Will get Covid vaxx when eligible      Functional Score: 100     Employment status: working full time.

## 2019-08-01 IMAGING — US US RENAL
1 series · 14 of 25 positions shown · non-contrast
Comparison: CT of the abdomen and pelvis

CLINICAL DATA: Renal failure

EXAM:
RENAL / URINARY TRACT ULTRASOUND COMPLETE

[Series 1: us renal · 0.26mm/px · 14 of 36 slices shown]
[im 1/36]
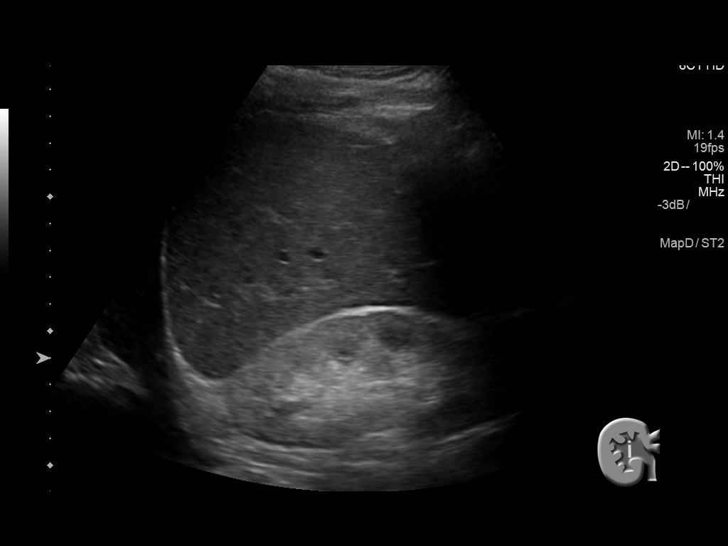
[im 3/36]
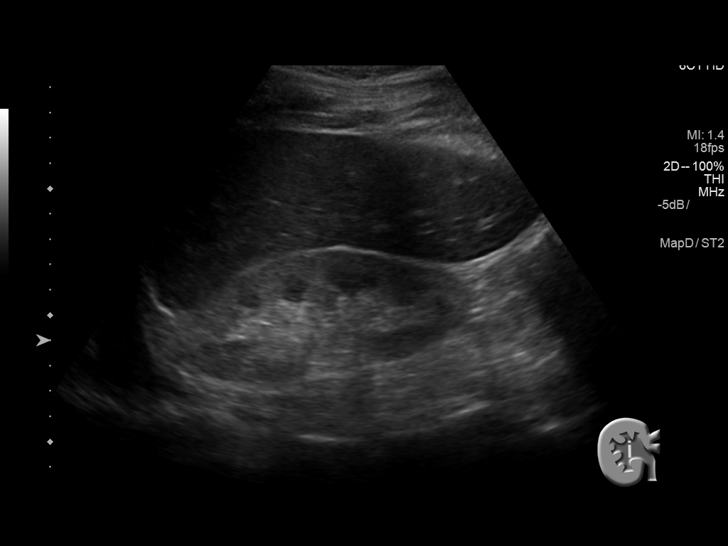
[im 6/36]
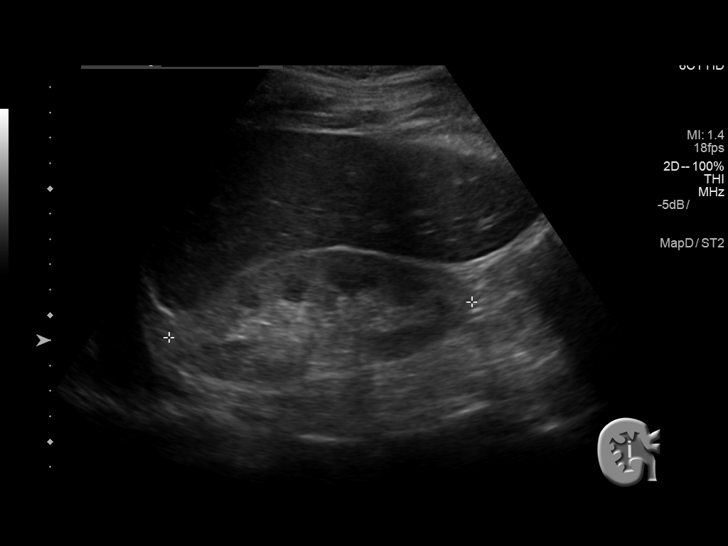
[im 9/36]
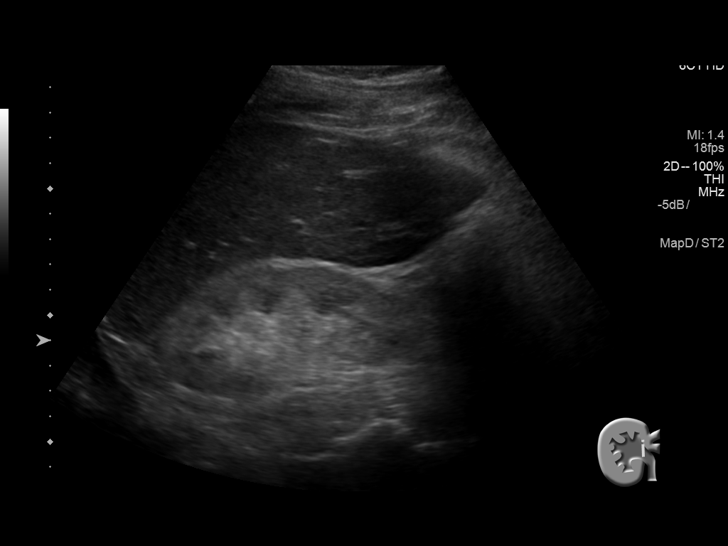
[im 12/36]
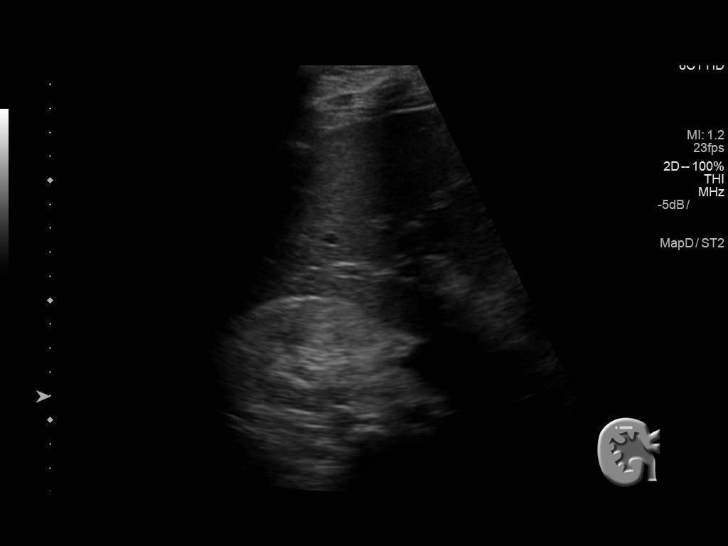
[im 14/36]
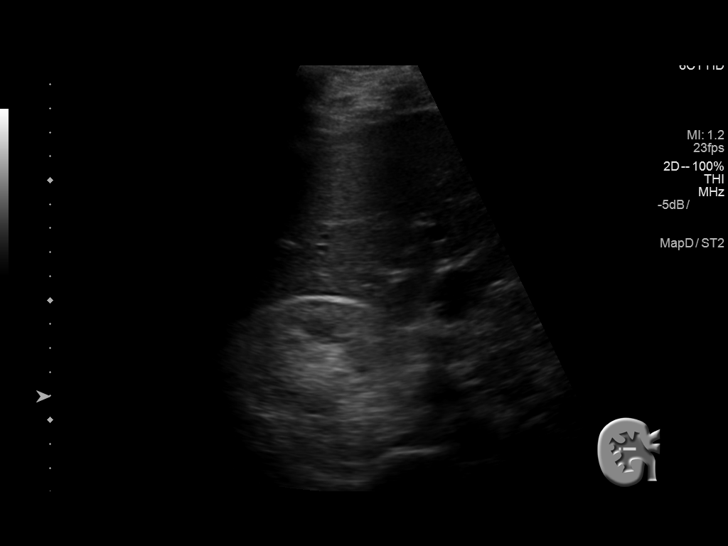
[im 17/36]
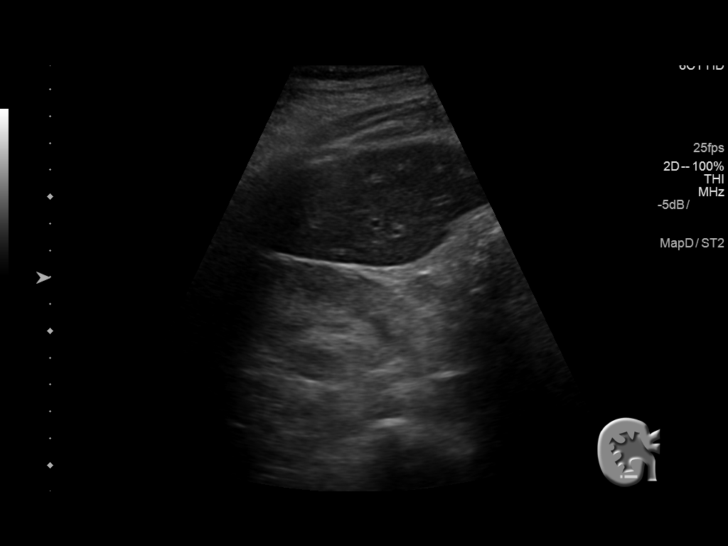
[im 19/36]
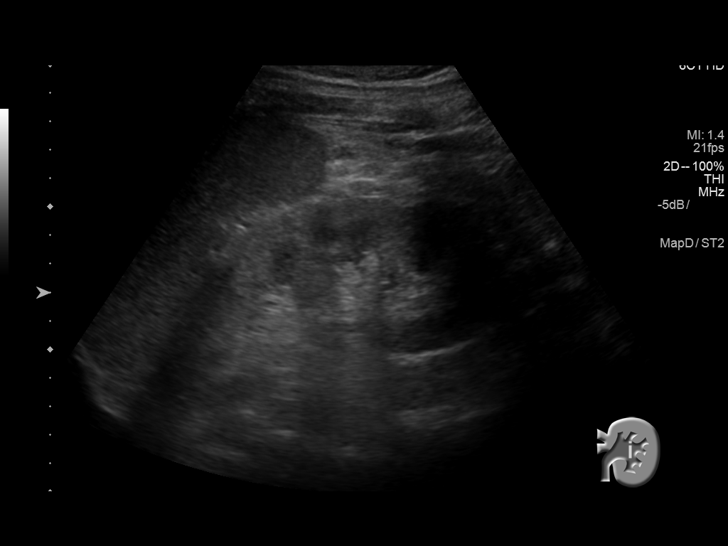
[im 22/36]
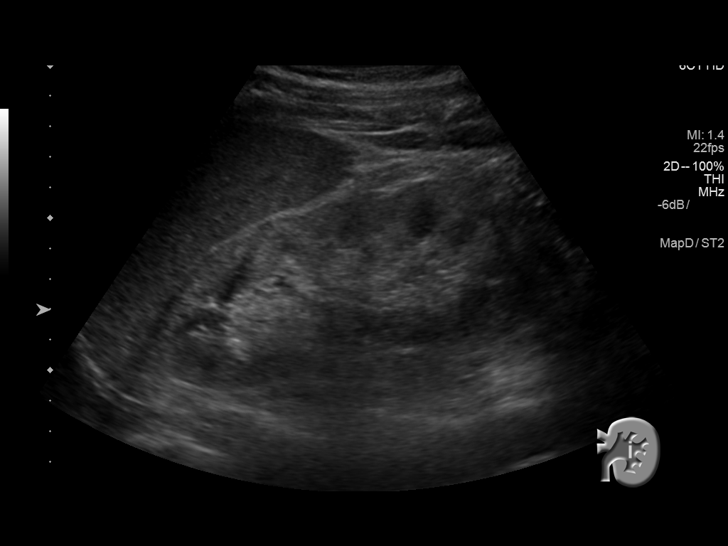
[im 24/36]
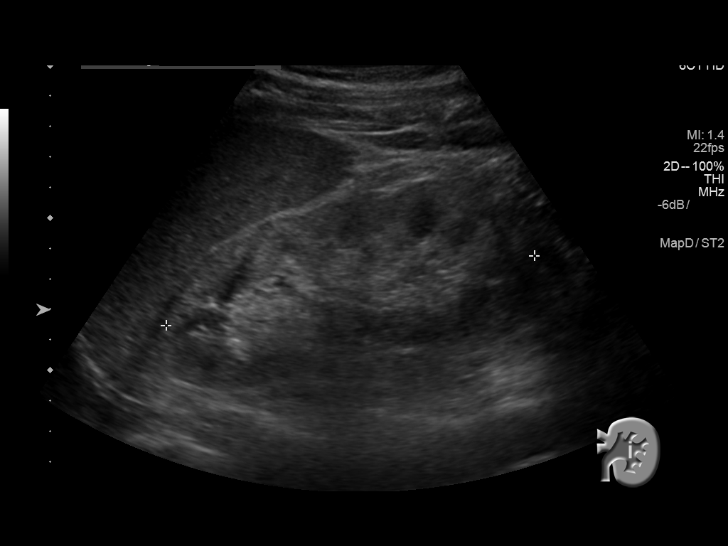
[im 27/36]
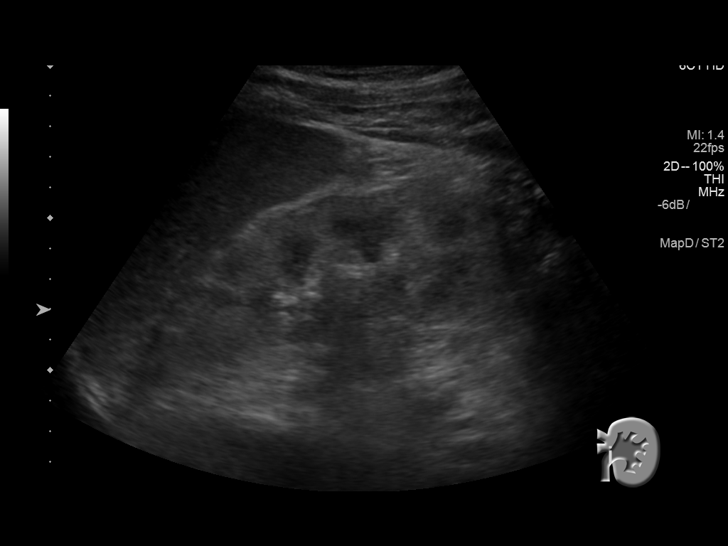
[im 30/36]
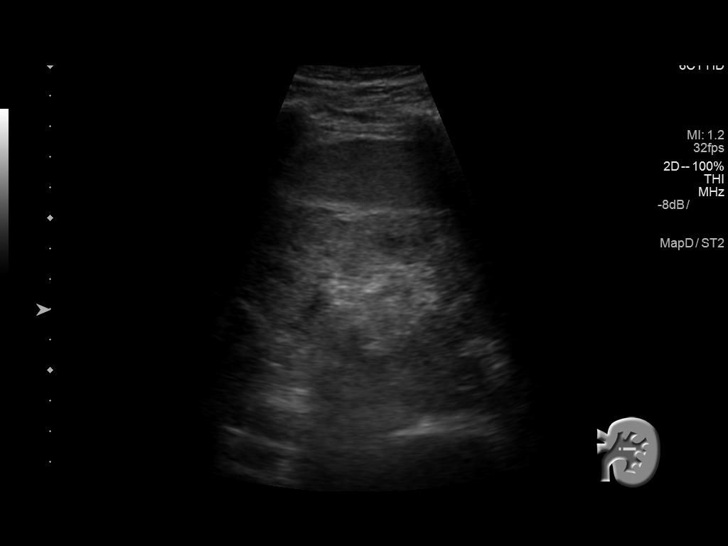
[im 33/36]
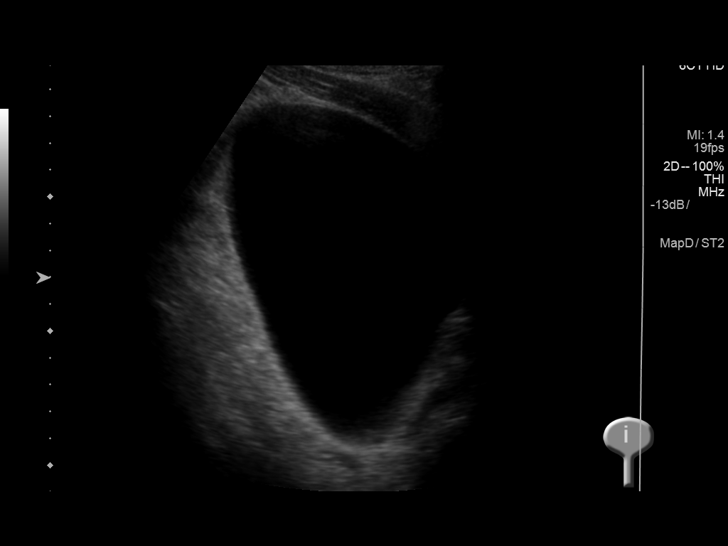
[im 36/36]
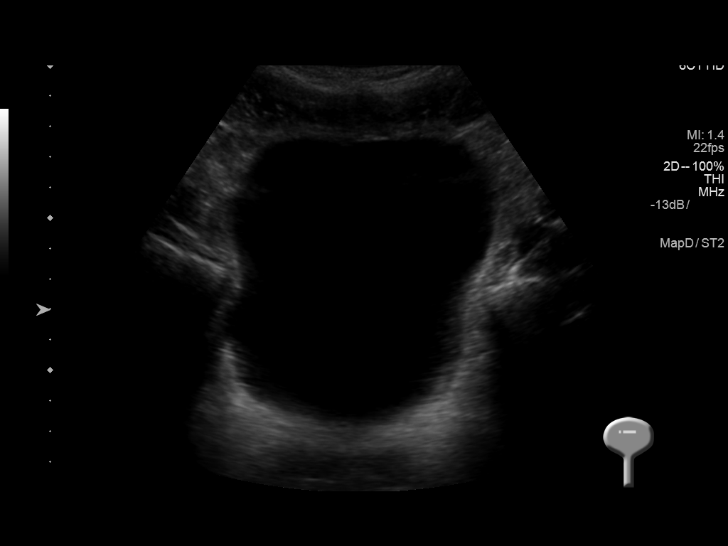

[14 of 25 positions shown; findings below may reference images not displayed]

FINDINGS: Right Kidney:

Length: 12 cm. Mild increased echogenicity is noted. No
hydronephrosis or mass lesion is noted.

Left Kidney:

Length: 12.3 cm.. Mild increased echogenicity is noted without
evidence of mass effect.

Bladder:

Appears normal for degree of bladder distention.
IMPRESSION: Mild increased echogenicity bilaterally. No obstructive changes are
seen.

## 2019-08-03 ENCOUNTER — Encounter: Admit: 2019-08-03 | Discharge: 2019-08-04 | Payer: PRIVATE HEALTH INSURANCE

## 2019-08-03 LAB — CBC W/ AUTO DIFF
BASOPHILS ABSOLUTE COUNT: 0 10*9/L (ref 0.0–0.1)
BASOPHILS RELATIVE PERCENT: 1.1 %
EOSINOPHILS ABSOLUTE COUNT: 0 10*9/L (ref 0.0–0.7)
EOSINOPHILS RELATIVE PERCENT: 5.2 %
HEMOGLOBIN: 10.6 g/dL — ABNORMAL LOW (ref 13.5–17.5)
LYMPHOCYTES ABSOLUTE COUNT: 0.2 10*9/L — ABNORMAL LOW (ref 0.7–4.0)
LYMPHOCYTES RELATIVE PERCENT: 19.7 %
MEAN CORPUSCULAR HEMOGLOBIN CONC: 33.8 g/dL (ref 30.0–36.0)
MEAN CORPUSCULAR HEMOGLOBIN: 32.2 pg (ref 26.0–34.0)
MEAN CORPUSCULAR VOLUME: 95.5 fL — ABNORMAL HIGH (ref 81.0–95.0)
MEAN PLATELET VOLUME: 6.9 fL — ABNORMAL LOW (ref 7.0–10.0)
MONOCYTES ABSOLUTE COUNT: 0.1 10*9/L (ref 0.1–1.0)
MONOCYTES RELATIVE PERCENT: 12.4 %
NEUTROPHILS ABSOLUTE COUNT: 0.5 10*9/L — ABNORMAL LOW (ref 1.7–7.7)
NEUTROPHILS RELATIVE PERCENT: 61.6 %
RED BLOOD CELL COUNT: 3.28 10*12/L — ABNORMAL LOW (ref 4.32–5.72)
RED CELL DISTRIBUTION WIDTH: 14.7 % (ref 12.0–15.0)
WBC ADJUSTED: 0.8 10*9/L — CL (ref 3.5–10.5)

## 2019-08-03 LAB — COMPREHENSIVE METABOLIC PANEL
ALBUMIN: 4.6 g/dL (ref 3.5–5.0)
ALT (SGPT): 25 U/L (ref <50)
ANION GAP: 15 mmol/L (ref 7–15)
AST (SGOT): 23 U/L (ref 19–55)
BILIRUBIN TOTAL: 0.5 mg/dL (ref 0.0–1.2)
BLOOD UREA NITROGEN: 25 mg/dL — ABNORMAL HIGH (ref 7–21)
BUN / CREAT RATIO: 13
CALCIUM: 9.8 mg/dL (ref 8.5–10.2)
CHLORIDE: 107 mmol/L (ref 98–107)
CO2: 22 mmol/L (ref 22.0–30.0)
CREATININE: 1.87 mg/dL — ABNORMAL HIGH (ref 0.70–1.30)
EGFR CKD-EPI AA MALE: 53 mL/min/{1.73_m2} — ABNORMAL LOW (ref >=60)
EGFR CKD-EPI NON-AA MALE: 46 mL/min/{1.73_m2} — ABNORMAL LOW (ref >=60)
GLUCOSE RANDOM: 109 mg/dL — ABNORMAL HIGH (ref 70–99)
POTASSIUM: 4 mmol/L (ref 3.5–5.0)
PROTEIN TOTAL: 7.2 g/dL (ref 6.5–8.3)
SODIUM: 144 mmol/L (ref 135–145)

## 2019-08-03 LAB — MAGNESIUM: Magnesium:MCnc:Pt:Ser/Plas:Qn:: 1.5 — ABNORMAL LOW

## 2019-08-03 LAB — GLUCOSE RANDOM: Glucose:MCnc:Pt:Ser/Plas:Qn:: 109 — ABNORMAL HIGH

## 2019-08-03 LAB — LIPID PANEL
CHOLESTEROL/HDL RATIO SCREEN: 3.2
CHOLESTEROL: 143 mg/dL (ref 100–199)
HDL CHOLESTEROL: 45 mg/dL (ref 40–59)
LDL CHOLESTEROL CALCULATED: 72 mg/dL
NON-HDL CHOLESTEROL: 98 mg/dL

## 2019-08-03 LAB — PHOSPHORUS: Phosphate:MCnc:Pt:Ser/Plas:Qn:: 3.3

## 2019-08-03 LAB — MEAN CORPUSCULAR HEMOGLOBIN: Erythrocyte mean corpuscular hemoglobin:EntMass:Pt:RBC:Qn:Automated count: 32.2

## 2019-08-03 LAB — TACROLIMUS, TROUGH: Lab: 11.3

## 2019-08-03 LAB — SMEAR REVIEW

## 2019-08-03 LAB — VLDL CHOLESTEROL CAL: Cholesterol.in VLDL:MCnc:Pt:Ser/Plas:Qn:Calculated: 26

## 2019-08-03 NOTE — Unmapped (Signed)
Received call from [facility] lab with critical value WBC 0.8. Reviewed with Dr. Toni Arthurs, pt is to STOP Myfortic and get repeat labs on Thursday 08/06/19. Pt made aware, verbalized understanding.

## 2019-08-04 DIAGNOSIS — Z940 Kidney transplant status: Principal | ICD-10-CM

## 2019-08-04 LAB — VITAMIN D, TOTAL (25OH): Lab: 50

## 2019-08-04 LAB — CMV DNA, QUANTITATIVE, PCR

## 2019-08-04 LAB — HEMOGLOBIN A1C: HEMOGLOBIN A1C: 4.5 % — ABNORMAL LOW (ref 4.8–5.6)

## 2019-08-04 LAB — ESTIMATED AVERAGE GLUCOSE: Estimated average glucose:MCnc:Pt:Bld:Qn:Estimated from glycated hemoglobin: 82

## 2019-08-04 LAB — CMV COMMENT: Lab: 0

## 2019-08-04 MED ORDER — TACROLIMUS XR 4 MG TABLET,EXTENDED RELEASE 24 HR
ORAL_TABLET | Freq: Every day | ORAL | 11 refills | 30 days | Status: CP
Start: 2019-08-04 — End: 2020-08-03
  Filled 2019-08-18: qty 30, 30d supply, fill #0

## 2019-08-04 MED ORDER — TACROLIMUS XR 1 MG TABLET,EXTENDED RELEASE 24 HR
ORAL_TABLET | Freq: Every day | ORAL | 11 refills | 30 days | Status: CP
Start: 2019-08-04 — End: 2020-08-03

## 2019-08-04 NOTE — Unmapped (Signed)
Reviewed tac level with Dr. Toni Arthurs, decreasing Envarsus to 9mg  daily. Pt made aware, verbalized understanding. Updated prescription sent to [facility].    Also emailed Belatacept Infusion paperwork to patient to sign and return.

## 2019-08-05 NOTE — Unmapped (Signed)
Change in Envarsus dosage decrease. Refill too soon until 08/12/2019 for Envarsus 4 mg and 08/14/2019 for Envarsus 1 mg. Will attempt test claim on each respective date.

## 2019-08-06 ENCOUNTER — Encounter: Admit: 2019-08-06 | Discharge: 2019-08-07 | Payer: PRIVATE HEALTH INSURANCE

## 2019-08-06 DIAGNOSIS — Z940 Kidney transplant status: Principal | ICD-10-CM

## 2019-08-06 DIAGNOSIS — Z79899 Other long term (current) drug therapy: Principal | ICD-10-CM

## 2019-08-06 LAB — MEAN CORPUSCULAR VOLUME: Erythrocyte mean corpuscular volume:EntVol:Pt:RBC:Qn:Automated count: 95.8 — ABNORMAL HIGH

## 2019-08-06 LAB — BASIC METABOLIC PANEL
ANION GAP: 13 mmol/L (ref 7–15)
BLOOD UREA NITROGEN: 32 mg/dL — ABNORMAL HIGH (ref 7–21)
BUN / CREAT RATIO: 16
CO2: 23 mmol/L (ref 22.0–30.0)
CREATININE: 2.01 mg/dL — ABNORMAL HIGH (ref 0.70–1.30)
EGFR CKD-EPI AA MALE: 48 mL/min/{1.73_m2} — ABNORMAL LOW (ref >=60)
EGFR CKD-EPI NON-AA MALE: 42 mL/min/{1.73_m2} — ABNORMAL LOW (ref >=60)
GLUCOSE RANDOM: 86 mg/dL (ref 70–179)
POTASSIUM: 4.5 mmol/L (ref 3.5–5.0)
SODIUM: 145 mmol/L (ref 135–145)

## 2019-08-06 LAB — CBC W/ AUTO DIFF
BASOPHILS ABSOLUTE COUNT: 0 10*9/L (ref 0.0–0.1)
BASOPHILS RELATIVE PERCENT: 1 %
EOSINOPHILS RELATIVE PERCENT: 4.2 %
HEMATOCRIT: 29.3 % — ABNORMAL LOW (ref 38.0–50.0)
HEMOGLOBIN: 9.8 g/dL — ABNORMAL LOW (ref 13.5–17.5)
LYMPHOCYTES ABSOLUTE COUNT: 0.2 10*9/L — ABNORMAL LOW (ref 0.7–4.0)
LYMPHOCYTES RELATIVE PERCENT: 22 %
MEAN CORPUSCULAR HEMOGLOBIN CONC: 33.3 g/dL (ref 30.0–36.0)
MEAN CORPUSCULAR HEMOGLOBIN: 31.9 pg (ref 26.0–34.0)
MEAN CORPUSCULAR VOLUME: 95.8 fL — ABNORMAL HIGH (ref 81.0–95.0)
MEAN PLATELET VOLUME: 6.7 fL — ABNORMAL LOW (ref 7.0–10.0)
MONOCYTES ABSOLUTE COUNT: 0.1 10*9/L (ref 0.1–1.0)
MONOCYTES RELATIVE PERCENT: 14 %
NEUTROPHILS RELATIVE PERCENT: 58.8 %
PLATELET COUNT: 255 10*9/L (ref 150–450)
RED BLOOD CELL COUNT: 3.06 10*12/L — ABNORMAL LOW (ref 4.32–5.72)
RED CELL DISTRIBUTION WIDTH: 14.4 % (ref 12.0–15.0)
WBC ADJUSTED: 0.8 10*9/L — CL (ref 3.5–10.5)

## 2019-08-06 LAB — POTASSIUM: Potassium:SCnc:Pt:Ser/Plas:Qn:: 4.5

## 2019-08-06 LAB — TACROLIMUS, TROUGH: Lab: 14

## 2019-08-06 LAB — MAGNESIUM: Magnesium:MCnc:Pt:Ser/Plas:Qn:: 1.7

## 2019-08-06 LAB — PHOSPHORUS: Phosphate:MCnc:Pt:Ser/Plas:Qn:: 3.7

## 2019-08-06 NOTE — Unmapped (Signed)
Lab orders entered

## 2019-08-07 ENCOUNTER — Encounter: Admit: 2019-08-07 | Discharge: 2019-08-08 | Payer: PRIVATE HEALTH INSURANCE

## 2019-08-07 DIAGNOSIS — Z940 Kidney transplant status: Principal | ICD-10-CM

## 2019-08-07 DIAGNOSIS — D899 Disorder involving the immune mechanism, unspecified: Principal | ICD-10-CM

## 2019-08-07 DIAGNOSIS — D72819 Decreased white blood cell count, unspecified: Principal | ICD-10-CM

## 2019-08-07 LAB — HLA DS POST TRANSPLANT
ANTI-DONOR DRW #1 MFI: 167 MFI
ANTI-DONOR HLA-A #1 MFI: 22 MFI
ANTI-DONOR HLA-A #2 MFI: 102 MFI
ANTI-DONOR HLA-B #1 MFI: 0 MFI
ANTI-DONOR HLA-B #2 MFI: 2 MFI
ANTI-DONOR HLA-C #1 MFI: 0 MFI
ANTI-DONOR HLA-C #2 MFI: 0 MFI
ANTI-DONOR HLA-DQB #1 MFI: 58 MFI
ANTI-DONOR HLA-DQB #2 MFI: 58 MFI
ANTI-DONOR HLA-DR #1 MFI: 23 MFI
ANTI-DONOR HLA-DR #2 MFI: 105 MFI

## 2019-08-07 LAB — ANTI-DONOR HLA-C #1 MFI: Lab: 0

## 2019-08-07 LAB — HLA CL2 AB COMMENT: Lab: 0

## 2019-08-07 LAB — HLA CL1 ANTIBODY COMM: Lab: 0

## 2019-08-07 NOTE — Unmapped (Signed)
Reviewed most recent WBC with Dr. Toni Arthurs, pt will come to [facility] at 2:30pm today for Filgrastim injection . Pt aware of appointment time.     Reviewed tac level with Dr. Toni Arthurs, decreasing Envarsus to 8mg  daily. Pt made aware, verbalized understanding. Updated prescription sent to [facility].

## 2019-08-07 NOTE — Unmapped (Signed)
Pt here for Granix injection. Granix 480 mcg given subcutaneous in right arm. Pt tolerated well.

## 2019-08-10 ENCOUNTER — Non-Acute Institutional Stay: Admit: 2019-08-10 | Discharge: 2019-08-11 | Payer: PRIVATE HEALTH INSURANCE

## 2019-08-10 LAB — CBC W/ AUTO DIFF
BASOPHILS ABSOLUTE COUNT: 0 10*9/L (ref 0.0–0.1)
BASOPHILS RELATIVE PERCENT: 1.9 %
EOSINOPHILS ABSOLUTE COUNT: 0.1 10*9/L (ref 0.0–0.7)
EOSINOPHILS RELATIVE PERCENT: 5.4 %
HEMATOCRIT: 31.9 % — ABNORMAL LOW (ref 38.0–50.0)
HEMOGLOBIN: 10.7 g/dL — ABNORMAL LOW (ref 13.5–17.5)
LYMPHOCYTES ABSOLUTE COUNT: 0.3 10*9/L — ABNORMAL LOW (ref 0.7–4.0)
LYMPHOCYTES RELATIVE PERCENT: 23.8 %
MEAN CORPUSCULAR HEMOGLOBIN CONC: 33.5 g/dL (ref 30.0–36.0)
MEAN CORPUSCULAR HEMOGLOBIN: 32.1 pg (ref 26.0–34.0)
MEAN CORPUSCULAR VOLUME: 95.7 fL — ABNORMAL HIGH (ref 81.0–95.0)
MEAN PLATELET VOLUME: 6.9 fL — ABNORMAL LOW (ref 7.0–10.0)
MONOCYTES ABSOLUTE COUNT: 0.3 10*9/L (ref 0.1–1.0)
MONOCYTES RELATIVE PERCENT: 26.2 %
NEUTROPHILS ABSOLUTE COUNT: 0.5 10*9/L — ABNORMAL LOW (ref 1.7–7.7)
PLATELET COUNT: 277 10*9/L (ref 150–450)
RED BLOOD CELL COUNT: 3.33 10*12/L — ABNORMAL LOW (ref 4.32–5.72)
WBC ADJUSTED: 1.1 10*9/L — CL (ref 3.5–10.5)

## 2019-08-10 LAB — BASIC METABOLIC PANEL
ANION GAP: 13 mmol/L (ref 7–15)
BLOOD UREA NITROGEN: 20 mg/dL (ref 7–21)
BUN / CREAT RATIO: 10
CALCIUM: 9.9 mg/dL (ref 8.5–10.2)
CHLORIDE: 107 mmol/L (ref 98–107)
CREATININE: 1.91 mg/dL — ABNORMAL HIGH (ref 0.70–1.30)
EGFR CKD-EPI AA MALE: 51 mL/min/{1.73_m2} — ABNORMAL LOW (ref >=60)
EGFR CKD-EPI NON-AA MALE: 44 mL/min/{1.73_m2} — ABNORMAL LOW (ref >=60)
GLUCOSE RANDOM: 110 mg/dL (ref 70–179)
POTASSIUM: 4.2 mmol/L (ref 3.5–5.0)

## 2019-08-10 LAB — PHOSPHORUS
PHOSPHORUS: 3 mg/dL (ref 2.9–4.7)
Phosphate:MCnc:Pt:Ser/Plas:Qn:: 3

## 2019-08-10 LAB — TACROLIMUS, TROUGH: Lab: 4.6 — ABNORMAL LOW

## 2019-08-10 LAB — MAGNESIUM: Magnesium:MCnc:Pt:Ser/Plas:Qn:: 1.6

## 2019-08-10 LAB — CHLORIDE: Chloride:SCnc:Pt:Ser/Plas:Qn:: 107

## 2019-08-10 LAB — MEAN CORPUSCULAR HEMOGLOBIN CONC: Erythrocyte mean corpuscular hemoglobin concentration:MCnc:Pt:RBC:Qn:Automated count: 33.5

## 2019-08-10 NOTE — Unmapped (Addendum)
WBC today 1.1, reviewed with Dr. Toni Arthurs, pt will get Granix injection. Pt made aware, verbalized understanding. Message sent to [facility] RN to schedule. Therapy plan entered. Pt will get labs on Thursday.     Reviewed tac level with Dr. Toni Arthurs, increasing Envarsus to 9mg  daily. Pt made aware, left detailed VM with dose change and call back number for questions. Updated prescription sent to [facility].

## 2019-08-11 ENCOUNTER — Encounter: Admit: 2019-08-11 | Discharge: 2019-08-12 | Payer: PRIVATE HEALTH INSURANCE

## 2019-08-11 DIAGNOSIS — Z940 Kidney transplant status: Principal | ICD-10-CM

## 2019-08-11 DIAGNOSIS — D72819 Decreased white blood cell count, unspecified: Principal | ICD-10-CM

## 2019-08-11 DIAGNOSIS — D899 Disorder involving the immune mechanism, unspecified: Principal | ICD-10-CM

## 2019-08-11 NOTE — Unmapped (Signed)
Pt here for granix. Given to right arm subcutaneous. Pt tolerated without difficultly.

## 2019-08-12 NOTE — Unmapped (Signed)
Change in Envarsus dosage decrease. Co-pay $0.00 for Envarsus 4 mg.

## 2019-08-13 ENCOUNTER — Encounter: Admit: 2019-08-13 | Discharge: 2019-08-14 | Payer: PRIVATE HEALTH INSURANCE

## 2019-08-13 LAB — CBC W/ AUTO DIFF
BASOPHILS ABSOLUTE COUNT: 0 10*9/L (ref 0.0–0.1)
BASOPHILS RELATIVE PERCENT: 1.1 %
EOSINOPHILS ABSOLUTE COUNT: 0.1 10*9/L (ref 0.0–0.7)
EOSINOPHILS RELATIVE PERCENT: 1.9 %
HEMATOCRIT: 34.1 % — ABNORMAL LOW (ref 38.0–50.0)
HEMOGLOBIN: 11.4 g/dL — ABNORMAL LOW (ref 13.5–17.5)
LYMPHOCYTES ABSOLUTE COUNT: 0.5 10*9/L — ABNORMAL LOW (ref 0.7–4.0)
LYMPHOCYTES RELATIVE PERCENT: 16 %
MEAN CORPUSCULAR HEMOGLOBIN CONC: 33.4 g/dL (ref 30.0–36.0)
MEAN CORPUSCULAR HEMOGLOBIN: 31.8 pg (ref 26.0–34.0)
MEAN CORPUSCULAR VOLUME: 95.2 fL — ABNORMAL HIGH (ref 81.0–95.0)
MONOCYTES ABSOLUTE COUNT: 0.8 10*9/L (ref 0.1–1.0)
MONOCYTES RELATIVE PERCENT: 29 %
NEUTROPHILS ABSOLUTE COUNT: 1.5 10*9/L — ABNORMAL LOW (ref 1.7–7.7)
NEUTROPHILS RELATIVE PERCENT: 52 %
NUCLEATED RED BLOOD CELLS: 0 /100{WBCs} (ref <=4)
PLATELET COUNT: 292 10*9/L (ref 150–450)
RED BLOOD CELL COUNT: 3.58 10*12/L — ABNORMAL LOW (ref 4.32–5.72)
RED CELL DISTRIBUTION WIDTH: 14.1 % (ref 12.0–15.0)

## 2019-08-13 LAB — BASIC METABOLIC PANEL
ANION GAP: 13 mmol/L (ref 7–15)
BLOOD UREA NITROGEN: 23 mg/dL — ABNORMAL HIGH (ref 7–21)
BUN / CREAT RATIO: 11
CALCIUM: 9.9 mg/dL (ref 8.5–10.2)
CO2: 25 mmol/L (ref 22.0–30.0)
CREATININE: 2.07 mg/dL — ABNORMAL HIGH (ref 0.70–1.30)
EGFR CKD-EPI AA MALE: 47 mL/min/{1.73_m2} — ABNORMAL LOW (ref >=60)
EGFR CKD-EPI NON-AA MALE: 40 mL/min/{1.73_m2} — ABNORMAL LOW (ref >=60)
GLUCOSE RANDOM: 105 mg/dL (ref 70–179)
POTASSIUM: 4.6 mmol/L (ref 3.5–5.0)

## 2019-08-13 LAB — SMEAR REVIEW

## 2019-08-13 LAB — PHOSPHORUS: Phosphate:MCnc:Pt:Ser/Plas:Qn:: 3.5

## 2019-08-13 LAB — ANION GAP: Anion gap 3:SCnc:Pt:Ser/Plas:Qn:: 13

## 2019-08-13 LAB — MAGNESIUM: Magnesium:MCnc:Pt:Ser/Plas:Qn:: 1.6

## 2019-08-13 LAB — BASOPHILS RELATIVE PERCENT: Basophils/100 leukocytes:NFr:Pt:Bld:Qn:Automated count: 1.1

## 2019-08-13 NOTE — Unmapped (Signed)
Received approval for Belatacept infusions from [employer] [id number].     Message sent to scheduler, pt will need infusion every two weeks x 5 doses and then monthly. Pt made aware, verbalized understanding.

## 2019-08-14 LAB — TACROLIMUS, TROUGH: Lab: 7.6

## 2019-08-14 NOTE — Unmapped (Signed)
Change in Envarsus dosage decrease. Co-pay $0.00 for Envarsus 1 mg.

## 2019-08-14 NOTE — Unmapped (Signed)
[facility] Refill Coordination Note    Specialty Medication(s) to be Shipped:   Transplant: Envarsus 1mg , Envarsus 4mg  and Prednisone 5mg     Other medication(s) to be shipped:   Aspirin  Vitmain D3  Mag 13mg     **Dose decrease on Envarsus to 9mg  Daily       Ryan Moon, DOB: [birth year]  Phone: [phone] (home)       All above HIPAA information was verified with patient.     Was a Nurse, learning disability used for this call? No    Completed refill call assessment today to schedule patient's medication shipment from the [facility] [phone]).       Specialty medication(s) and dose(s) confirmed: Dose decrease on Envarsus to 9mg  Daily   Changes to medications: [name] reports no changes at this time.  Changes to insurance: No  Questions for the pharmacist: No    Confirmed patient received Welcome Packet with first shipment. The patient will receive a drug information handout for each medication shipped and additional FDA Medication Guides as required.       DISEASE/MEDICATION-SPECIFIC INFORMATION        N/A    SPECIALTY MEDICATION ADHERENCE     Medication Adherence    Patient reported X missed doses in the last month: 0      Envarsus 1mg : 8 days worth of medication on hand.  Envarsus 4mg : 8 days worth of medication on hand.  Prednisone 5mg : 10 days worth of medication on hand.              SHIPPING     Shipping address confirmed in Epic.     Delivery Scheduled: Yes, Expected medication delivery date: 08/18/19.     Medication will be delivered via UPS to the prescription address in Epic WAM.    [name]   [facility] Specialty Technician

## 2019-08-14 NOTE — Unmapped (Signed)
Patient has requested a medication refill via EPIC

## 2019-08-17 NOTE — Unmapped (Signed)
Change in Mycophenolate dosage decrease. Co-pay $80.

## 2019-08-18 MED FILL — MG-PLUS-PROTEIN 133 MG TABLET: 100 days supply | Qty: 100 | Fill #1 | Status: AC

## 2019-08-18 MED FILL — MG-PLUS-PROTEIN 133 MG TABLET: ORAL | 100 days supply | Qty: 100 | Fill #1

## 2019-08-18 MED FILL — ENVARSUS XR 4 MG TABLET,EXTENDED RELEASE: ORAL | 30 days supply | Qty: 60 | Fill #0

## 2019-08-18 MED FILL — PREDNISONE 5 MG TABLET: 30 days supply | Qty: 30 | Fill #2 | Status: AC

## 2019-08-18 MED FILL — PREDNISONE 5 MG TABLET: ORAL | 30 days supply | Qty: 30 | Fill #2

## 2019-08-18 MED FILL — ASPIRIN 81 MG TABLET,DELAYED RELEASE: 30 days supply | Qty: 30 | Fill #3 | Status: AC

## 2019-08-18 MED FILL — ASPIRIN 81 MG TABLET,DELAYED RELEASE: ORAL | 30 days supply | Qty: 30 | Fill #3

## 2019-08-18 MED FILL — ENVARSUS XR 4 MG TABLET,EXTENDED RELEASE: 30 days supply | Qty: 60 | Fill #0 | Status: AC

## 2019-08-18 MED FILL — ENVARSUS XR 1 MG TABLET,EXTENDED RELEASE: 30 days supply | Qty: 30 | Fill #0 | Status: AC

## 2019-08-20 ENCOUNTER — Encounter: Admit: 2019-08-20 | Discharge: 2019-08-21 | Payer: PRIVATE HEALTH INSURANCE

## 2019-08-20 LAB — BASIC METABOLIC PANEL
BUN / CREAT RATIO: 15
CALCIUM: 9.7 mg/dL (ref 8.5–10.2)
CHLORIDE: 108 mmol/L — ABNORMAL HIGH (ref 98–107)
CO2: 24 mmol/L (ref 22.0–30.0)
CREATININE: 1.79 mg/dL — ABNORMAL HIGH (ref 0.70–1.30)
EGFR CKD-EPI AA MALE: 56 mL/min/{1.73_m2} — ABNORMAL LOW (ref >=60)
EGFR CKD-EPI NON-AA MALE: 48 mL/min/{1.73_m2} — ABNORMAL LOW (ref >=60)
GLUCOSE RANDOM: 105 mg/dL (ref 70–179)
POTASSIUM: 4.3 mmol/L (ref 3.5–5.0)
SODIUM: 144 mmol/L (ref 135–145)

## 2019-08-20 LAB — CBC W/ AUTO DIFF
BASOPHILS ABSOLUTE COUNT: 0 10*9/L (ref 0.0–0.1)
BASOPHILS RELATIVE PERCENT: 0.9 %
EOSINOPHILS ABSOLUTE COUNT: 0 10*9/L (ref 0.0–0.7)
EOSINOPHILS RELATIVE PERCENT: 2 %
HEMOGLOBIN: 10.8 g/dL — ABNORMAL LOW (ref 13.5–17.5)
LYMPHOCYTES ABSOLUTE COUNT: 0.4 10*9/L — ABNORMAL LOW (ref 0.7–4.0)
LYMPHOCYTES RELATIVE PERCENT: 18 %
MEAN CORPUSCULAR HEMOGLOBIN CONC: 33.8 g/dL (ref 30.0–36.0)
MEAN CORPUSCULAR HEMOGLOBIN: 31.9 pg (ref 26.0–34.0)
MEAN PLATELET VOLUME: 6.9 fL — ABNORMAL LOW (ref 7.0–10.0)
MONOCYTES ABSOLUTE COUNT: 0.3 10*9/L (ref 0.1–1.0)
MONOCYTES RELATIVE PERCENT: 14.8 %
NEUTROPHILS ABSOLUTE COUNT: 1.3 10*9/L — ABNORMAL LOW (ref 1.7–7.7)
NUCLEATED RED BLOOD CELLS: 0 /100{WBCs} (ref <=4)
PLATELET COUNT: 245 10*9/L (ref 150–450)
RED BLOOD CELL COUNT: 3.38 10*12/L — ABNORMAL LOW (ref 4.32–5.72)
WBC ADJUSTED: 2 10*9/L — ABNORMAL LOW (ref 3.5–10.5)

## 2019-08-20 LAB — PHOSPHORUS: Phosphate:MCnc:Pt:Ser/Plas:Qn:: 4.3

## 2019-08-20 LAB — HEMATOCRIT: Hematocrit:VFr:Pt:Bld:Qn:: 31.9 — ABNORMAL LOW

## 2019-08-20 LAB — GLUCOSE RANDOM: Glucose:MCnc:Pt:Ser/Plas:Qn:: 105

## 2019-08-20 LAB — TACROLIMUS, TROUGH: Lab: 11.8

## 2019-08-20 LAB — MAGNESIUM: Magnesium:MCnc:Pt:Ser/Plas:Qn:: 1.8

## 2019-08-20 MED ORDER — OMEPRAZOLE 20 MG CAPSULE,DELAYED RELEASE
ORAL_CAPSULE | Freq: Every day | ORAL | 5 refills | 30.00000 days | Status: CP
Start: 2019-08-20 — End: ?

## 2019-08-20 MED ORDER — BELATACEPT 250 MG INTRAVENOUS SOLUTION
0 refills | 0 days
Start: 2019-08-20 — End: ?

## 2019-08-20 MED ORDER — TACROLIMUS XR 1 MG TABLET,EXTENDED RELEASE 24 HR
ORAL_TABLET | 0 refills | 0 days | Status: CP
Start: 2019-08-20 — End: ?

## 2019-08-20 MED ORDER — MYCOPHENOLATE SODIUM 180 MG TABLET,DELAYED RELEASE
ORAL_TABLET | 11 refills | 0 days
Start: 2019-08-20 — End: ?

## 2019-08-20 NOTE — Unmapped (Signed)
Pt will receive first dose of Belatacept today, reviewed Tacrolimus wean with L. Mincemoyer Pharm D:     Decrease Envarsus to 5mg  daily x 1 week after 2nd dose of Belatacept on 09/03/2019- 09/10/19    Decrease Envarsus to 3mg  daily x 1 week 4/9-4/15/21    STOP Envarsus 09/18/19    Reviewed with patient, verbalized understanding.

## 2019-08-20 NOTE — Unmapped (Signed)
1610 Patient arrived Transplant Infusion Room today for belatacept, Condition: well; Mobility: ambulating; accompanied by self.   See Flowsheet and MAR for all details of visit.   0956 VS stable.  1003 PIV placed, NS KVO; labs no orders found, urine no orders found.  1005 Infusion initiated.  1035 Infusion complete.  1040 VS stable, PIV removed, pt left clinic, Condition: well; Mobility: ambulating; accompanied by self.

## 2019-08-20 NOTE — Unmapped (Signed)
[facility] PHARMACY NOTE  08/20/2019   Ryan Moon  [id number]    Medication changes today:   1. Decrease omeprazole to 20 mg once daily  2. Restart Bactrim SS MWF  3. Taper Envarsus to 5 mg from 4/1-4/8, then decrease to 3 mg from 4/9-4/15 then stop    Education/Adherence tools provided today:  1.provided updated medication list  2. provided additional pill box education  3.  provided additional education on immunosuppression and transplant related medications including reviewing indications of medications, dosing and side effects    Follow up items:  1. goal of understanding indications and dosing of immunosuppression medications    Next visit with pharmacy in 1-3 months  ____________________________________________________________________    Ryan Moon is a 34 y.o. 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  He presented to the ED 1 day transplant hospitalization discharge for abdominal pain and dysuria.  Renal U/S showed interval decrease perinephric fluid collection but a large post void residual.  He was started on Flomax and a foley catheter was placed that has since been removed. He has had 1 readmission since discharge for pain, where he was treated for an E. Faecalis UTI with doxycycline.    Interval history: Transitioning to belatacept with first dose today    Seen by pharmacy today for: medication management and pill box fill and adherence education; last seen by pharmacy 1 months ago     CC:  Patient has no complaints today    There were no vitals filed for this visit.    Allergies   Allergen Reactions   ??? Acetaminophen Nausea And Vomiting       All medications reviewed and updated.     Medication list includes revisions made during today???s encounter    Outpatient Encounter Medications as of 08/20/2019   Medication Sig Dispense Refill   ??? acetaminophen (TYLENOL EXTRA STRENGTH) 500 MG tablet Take 2 tablets (1,000 mg total) by mouth every six (6) hours as needed for pain or fever (> 38C). 100 tablet 0   ??? ALPRAZolam (XANAX) 0.25 MG tablet Take 0.5 mg by mouth every twelve (12) hours.      ??? amLODIPine (NORVASC) 10 MG tablet Take 1 tablet (10 mg total) by mouth daily. 90 tablet 3   ??? aspirin (ECOTRIN) 81 MG tablet Take 1 tablet (81 mg total) by mouth daily. 30 tablet 11   ??? busPIRone (BUSPAR) 15 MG tablet Take 15 mg by mouth two (2) times a day.     ??? cloNIDine HCL (CATAPRES) 0.1 MG tablet Take 1 tablet (0.1 mg total) by mouth Two (2) times a day. (Patient taking differently: Take 0.1 mg by mouth daily. ) 60 tablet 11   ??? docusate sodium (COLACE) 100 MG capsule Take 1 capsule (100 mg total) by mouth Two (2) times a day. 180 capsule 3   ??? ergocalciferol (DRISDOL) 1,250 mcg (50,000 unit) capsule Take 1 capsule (50,000 Units total) by mouth once a week. 4 capsule 1   ??? escitalopram oxalate (LEXAPRO) 10 MG tablet Take 15 mg by mouth.     ??? gabapentin (NEURONTIN) 100 MG capsule Take 200 mg 3 times daily for 3 days then 100 mg 3 times daily for  3 days then stop 270 capsule 1   ??? hydrALAZINE (APRESOLINE) 100 MG tablet Take 1 tablet (100 mg total) by mouth Two (2) times a day. 60 tablet 11   ??? magnesium oxide-Mg AA chelate (MAGNESIUM, AMINO ACID CHELATE,) 133 mg Tab Take 1 tablet by mouth daily. 100 tablet 11   ??? mycophenolate (MYFORTIC) 180 MG EC tablet Take 2 tablets (360 mg total) by mouth Two (2) times a day. 120 tablet 11   ??? omeprazole (PRILOSEC) 20 MG capsule Take 20 mg by mouth two (2) times a day.      ??? predniSONE (DELTASONE) 5 MG tablet Take 1 tablet (5 mg total) by mouth daily. 30 tablet 11   ??? sulfamethoxazole-trimethoprim (BACTRIM) 400-80 mg per tablet Take 1 tablet (80 mg of trimethoprim total) by mouth 3 (three) times a week. 12 tablet 3   ??? tacrolimus (ENVARSUS XR) 1 mg Tb24 extended release tablet Take 1 tablet (1 mg total) by mouth daily with 2 (4 mg) tablets for a total dose of 9mg  daily. 30 tablet 11   ??? tacrolimus (ENVARSUS XR) 4 mg Tb24 extended release tablet Take 2 tablets (8 mg total) by mouth daily with 1 (1 mg) tablet for a total dose of 9mg  daily. 60 tablet 11   ??? [EXPIRED] valGANciclovir (VALCYTE) 450 mg tablet Take 1 tablet (450 mg total) by mouth daily for 15 days. 15 tablet 0     Facility-Administered Encounter Medications as of 08/20/2019   Medication Dose Route Frequency Provider Last Rate Last Admin   ??? [COMPLETED] belatacept (NULOJIX) 362.5 mg in sodium chloride (NS) 0.9 % 100 mL IVPB  5 mg/kg Intravenous Once Griffin Dakin, MD   Stopped at 08/20/19 1035     Induction agent : alemtuzumab    CURRENT IMMUNOSUPPRESSION:   Belatacept 5 mg/kg 3/18, 4/1, 4/15, 4/29, 5/13 then monthly   prograf/Envarsus/cyclosporine goal: N/A - transitioning to belatacept   myfortic held since 3/1 for neutropenia   prednisone 5 mg daily (continuation of prednisone for ANA)    Patient complains of headache and tremor    IMMUNOSUPPRESSION DRUG LEVELS:  Lab Results   Component Value Date    Tacrolimus, Trough 11.8 08/20/2019    Tacrolimus, Trough 7.6 08/13/2019    Tacrolimus, Trough 4.6 (L) 08/10/2019     No results found for: CYCLO  No results found for: EVEROLIMUS  No results found for: SIROLIMUS    Envarsus level is accurate 24 hour trough    Graft function: stable  DSA: ntd  Biopsies to date: zero hour biopsy with minimal arteriosclerosis   -06/19/19: negative for rejection; non pathologic abnormality identified  UPC: 0.16 on 07/14/19  WBC/ANC:  2.0/1.3    Plan: First dose of belatacept today.  Will wean Envarsus starting 4/1- 4/8 decrease to 5 mg daily then take 3 mg daily 4/9-15 then stop.  Will monitor WBC for ability to add back Myfortic. Continue to monitor    OI Prophylaxis:   CMV Status: D+/ R+, moderate risk . CMV prophylaxis: valganciclovir 450 mg daily x 3 months per protocol complete  Estimated Creatinine Clearance: 59.5 mL/min (A) (based on SCr of 1.79 mg/dL (H)).  No results found for: CMVCP  PCP Prophylaxis: bactrim SS 1 tab MWF x 6 months (end 10/26/19).  Thrush: completed in hospital  Patient is  tolerating infectious prophylaxis well    Plan: Continue per protocol. Continue to monitor.    CV Prophylaxis: asa 81 mg   The ASCVD Risk  score Denman George DC Montez Hageman, et al., 2013) failed to calculate.  Statin therapy: Not indicated; currently on no statin  Plan:  Continue to monitor     BP: Goal < 140/90. Clinic vitals reported above.    Home BP ranges: 115-120s/80-85s  Current meds include: amlodipine 10 mg daily, hydralazine 100 mg BID  Plan: within goal.  Continue to monitor    Anemia of CKD:  H/H:   Lab Results   Component Value Date    HGB 10.8 (L) 08/20/2019     Lab Results   Component Value Date    HCT 31.9 (L) 08/20/2019     Iron panel:  No results found for: IRON, TIBC, FERRITIN  No results found for: LABIRON    Prior ESA use: none post transplant    Plan: stable. Continue to monitor.     DM:   Lab Results   Component Value Date    A1C 4.5 (L) 08/03/2019   . Goal A1c < 7  History of Dm? No  Diet:did not address  Exercise: yes - daily reports some joint pain with this  Plan:  Continue to monitor     Fluid intake: 64 oz daily    Electrolytes: wnl  Meds currently on: mg plus protein to 133 mg daily  Plan:. Continue to monitor     GI/BM: pt reports no diarrhea; has normal BM; has occasional reflux  Meds currently on: docusate BID, omeprazole 20 mg BID (had been on once daily PPI but had breakthrough reflux on once daily)  Plan: Decrease omeprazole to 20 mg daily Continue to monitor    Pain: pt reports diffuse joint pain that he attributes to excercising  Meds currently on: APAP PRN  Plan:.. Continue to monitor    Bone health:   Vitamin D Level: 50 on 08/03/19. Goal > 30.   Last DEXA results:  none available  Current meds include: none  Plan: Vitamin D level  within goal; take D3 2,000 units daily. Continue to monitor.     Women's/Men's Health:  Ryan Moon is a 34 y.o. male. Patient reports no men's/women's health issues  Plan: Continue to monitor    Anxiety: still endorses anxiety and panic attacks  Meds currently on: escitalopram 15 mg daily, alprazolam 0.5 mg BID PRN (uses ~twice daily), buspirone 15 mg BID (started by PCP recently in attempt to wean of benzo)   **Reports smoking vs edible marijuana daily  Plan: Pt asked for Xanax refill.  Told him to reach out to managing provider. Continue to monitor.    Pharmacy preference:  [facility] and local [facility]    Adherence: Patient has average understanding of medications; was able to independently identify names/doses of immunosuppressants and OI meds.  Patient  does fill their own pill box on a regular basis at home.  His wife also helps with meds but he is filling his pill box independently.   Patient brought medication card:no  Pill box:did not bring   Plan: Provided moderate adherence counseling/intervention    Spent approximately 20 minutes on educating this patient and greater than 50% was spent in direct face to face counseling regarding post transplant medication education. Questions and concerns were address to patient's satisfaction.    During this visit, the following was completed:   BG log data assessment  BP log data assessment  Labs ordered and evaluated  complex treatment plan >1 DS   Patient education was completed for 11-24 minutes     All  questions/concerns were addressed to the patient's satisfaction.  __________________________________________  Cleone Slim, PHARMD, CPP  [employer] [employer] TR[facility] CLINICAL PHARMACIST PRACTITIONER  PAGER [phone]

## 2019-08-21 MED ORDER — SULFAMETHOXAZOLE 400 MG-TRIMETHOPRIM 80 MG TABLET
ORAL_TABLET | ORAL | 3 refills | 28.00000 days | Status: CP
Start: 2019-08-21 — End: 2019-12-16
  Filled 2019-08-21: qty 12, 28d supply, fill #0

## 2019-08-21 MED FILL — SULFAMETHOXAZOLE 400 MG-TRIMETHOPRIM 80 MG TABLET: 28 days supply | Qty: 12 | Fill #0 | Status: AC

## 2019-08-21 NOTE — Unmapped (Signed)
3/19: contacting patient due to dosage change and then discontinuation of mycophenolate -[name]    [facility] Clinical Assessment & Refill Coordination Note    Ryan Moon, DOB: Mar 10, 1986  Phone: [phone] (home)     All above HIPAA information was verified with patient.     Was a Nurse, learning disability used for this call? No    Specialty Medication(s):   Transplant: Envarsus 1mg ,  mycophenolic acid 180mg  and Prednisone 5mg      Current Outpatient Medications   Medication Sig Dispense Refill   ??? acetaminophen (TYLENOL EXTRA STRENGTH) 500 MG tablet Take 2 tablets (1,000 mg total) by mouth every six (6) hours as needed for pain or fever (> 38C). 100 tablet 0   ??? ALPRAZolam (XANAX) 0.25 MG tablet Take 0.5 mg by mouth every twelve (12) hours.      ??? amLODIPine (NORVASC) 10 MG tablet Take 1 tablet (10 mg total) by mouth daily. 90 tablet 3   ??? aspirin (ECOTRIN) 81 MG tablet Take 1 tablet (81 mg total) by mouth daily. 30 tablet 11   ??? belatacept (NULOJIX) 250 mg SolR Belatacept  5 mg/kg every 2 weeks for 5 doses then once every 4 weeks 14.5 mL 0   ??? busPIRone (BUSPAR) 15 MG tablet Take 15 mg by mouth two (2) times a day.     ??? docusate sodium (COLACE) 100 MG capsule Take 1 capsule (100 mg total) by mouth Two (2) times a day. 180 capsule 3   ??? escitalopram oxalate (LEXAPRO) 10 MG tablet Take 15 mg by mouth.     ??? gabapentin (NEURONTIN) 100 MG capsule Take 200 mg 3 times daily for 3 days then 100 mg 3 times daily for 3 days then stop 270 capsule 1   ??? hydrALAZINE (APRESOLINE) 100 MG tablet Take 1 tablet (100 mg total) by mouth Two (2) times a day. 60 tablet 11   ??? magnesium oxide-Mg AA chelate (MAGNESIUM, AMINO ACID CHELATE,) 133 mg Tab Take 1 tablet by mouth daily. 100 tablet 11   ??? mycophenolate (MYFORTIC) 180 MG EC tablet Hold 120 tablet 11   ??? omeprazole (PRILOSEC) 20 MG capsule Take 1 capsule (20 mg total) by mouth daily. 30 capsule 5   ??? predniSONE (DELTASONE) 5 MG tablet Take 1 tablet (5 mg total) by mouth daily. 30 tablet 11   ??? sulfamethoxazole-trimethoprim (BACTRIM) 400-80 mg per tablet Take 1 tablet (80 mg of trimethoprim total) by mouth 3 (three) times a week. 12 tablet 3   ??? tacrolimus (ENVARSUS XR) 1 mg Tb24 extended release tablet Take 9 tablets (9 mg) by mouth daily until 3/31.  THEN, From 4/1-4/8, take 5 tablets (5 mg) daily. THEN, from 4/9-4/15 take 3 tablets (3 mg) daily. THEN STOP. 100 tablet 0     No current facility-administered medications for this visit.         Changes to medications: see mycophen below    Allergies   Allergen Reactions   ??? Acetaminophen Nausea And Vomiting       Changes to allergies: No    SPECIALTY MEDICATION ADHERENCE     mycophenolate 180mg   : 25 days of medicine on hand and not taking at this time  envarsus 1mg   : 30 days of medicine on hand   Prednisone 5mg   : 30 days of medicine on hand      Medication Adherence    Patient reported X missed doses in the last month: 0  Specialty Medication:  envarsus 1mg   Patient is on additional specialty medications: Yes  Additional Specialty Medications: Prednisone 5mg   Patient Reported Additional Medication X Missed Doses in the Last Month: 0  Patient is on more than two specialty medications: Yes  Specialty Medication: mycophenolate 180mg   Patient Reported Additional Medication X Missed Doses in the Last Month: 0          Specialty medication(s) dose(s) confirmed: mycophenolate was dose decreased but now being held completely, pt doesn't need it at all at this time     Are there any concerns with adherence? No    Adherence counseling provided? Not needed    CLINICAL MANAGEMENT AND INTERVENTION      Clinical Benefit Assessment:    Do you feel the medicine is effective or helping your condition? Yes    Clinical Benefit counseling provided? Not needed    Adverse Effects Assessment:    Are you experiencing any side effects? No    Are you experiencing difficulty administering your medicine? No    Quality of Life Assessment: How many days over the past month did your transplant  keep you from your normal activities? For example, brushing your teeth or getting up in the morning. 0    Have you discussed this with your provider? Not needed    Therapy Appropriateness:    Is therapy appropriate? Yes, therapy is appropriate and should be continued    DISEASE/MEDICATION-SPECIFIC INFORMATION      N/A    PATIENT SPECIFIC NEEDS     ? Does the patient have any physical, cognitive, or cultural barriers? No    ? Is the patient high risk? Yes, patient is taking a REMS drug. Medication is dispensed in compliance with REMS program.     ? Does the patient require a Care Management Plan? No     ? Does the patient require physician intervention or other additional services (i.e. nutrition, smoking cessation, social work)? No      SHIPPING     Specialty Medication(s) to be Shipped:   na    Other medication(s) to be shipped: na     Changes to insurance: No    Delivery Scheduled: Patient declined refill at this time due to supply on hand.     Medication will be delivered via na to the confirmed na address in [facility].    The patient will receive a drug information handout for each medication shipped and additional FDA Medication Guides as required.  Verified that patient has previously received a Conservation officer, historic buildings.    All of the patient's questions and concerns have been addressed.    [name]   [facility] Specialty Pharmacist

## 2019-08-21 NOTE — Unmapped (Signed)
Change in Envarsus dosage decrease. Refill too soon until 09/07/2019 on co-pay card. Will attempt test claim on 09/07/2019 date.

## 2019-08-24 ENCOUNTER — Encounter: Admit: 2019-08-24 | Discharge: 2019-08-25 | Payer: PRIVATE HEALTH INSURANCE

## 2019-08-24 LAB — BASIC METABOLIC PANEL
ANION GAP: 12 mmol/L (ref 7–15)
BLOOD UREA NITROGEN: 22 mg/dL — ABNORMAL HIGH (ref 7–21)
BUN / CREAT RATIO: 13
CHLORIDE: 104 mmol/L (ref 98–107)
CO2: 28 mmol/L (ref 22.0–30.0)
CREATININE: 1.71 mg/dL — ABNORMAL HIGH (ref 0.70–1.30)
EGFR CKD-EPI AA MALE: 59 mL/min/{1.73_m2} — ABNORMAL LOW (ref >=60)
EGFR CKD-EPI NON-AA MALE: 51 mL/min/{1.73_m2} — ABNORMAL LOW (ref >=60)
POTASSIUM: 4.4 mmol/L (ref 3.5–5.0)
SODIUM: 144 mmol/L (ref 135–145)

## 2019-08-24 LAB — CBC W/ AUTO DIFF
BASOPHILS ABSOLUTE COUNT: 0 10*9/L (ref 0.0–0.1)
BASOPHILS RELATIVE PERCENT: 1.5 %
EOSINOPHILS ABSOLUTE COUNT: 0.1 10*9/L (ref 0.0–0.7)
EOSINOPHILS RELATIVE PERCENT: 3 %
HEMATOCRIT: 33.6 % — ABNORMAL LOW (ref 38.0–50.0)
HEMOGLOBIN: 11.3 g/dL — ABNORMAL LOW (ref 13.5–17.5)
LYMPHOCYTES ABSOLUTE COUNT: 0.4 10*9/L — ABNORMAL LOW (ref 0.7–4.0)
LYMPHOCYTES RELATIVE PERCENT: 18 %
MEAN CORPUSCULAR HEMOGLOBIN CONC: 33.6 g/dL (ref 30.0–36.0)
MEAN CORPUSCULAR HEMOGLOBIN: 31.7 pg (ref 26.0–34.0)
MEAN CORPUSCULAR VOLUME: 94.4 fL (ref 81.0–95.0)
MEAN PLATELET VOLUME: 7.2 fL (ref 7.0–10.0)
MONOCYTES ABSOLUTE COUNT: 0.2 10*9/L (ref 0.1–1.0)
MONOCYTES RELATIVE PERCENT: 9.9 %
NEUTROPHILS ABSOLUTE COUNT: 1.3 10*9/L — ABNORMAL LOW (ref 1.7–7.7)
NEUTROPHILS RELATIVE PERCENT: 67.6 %
NUCLEATED RED BLOOD CELLS: 0 /100{WBCs} (ref <=4)
PLATELET COUNT: 261 10*9/L (ref 150–450)
RED CELL DISTRIBUTION WIDTH: 14.2 % (ref 12.0–15.0)
WBC ADJUSTED: 2 10*9/L — ABNORMAL LOW (ref 3.5–10.5)

## 2019-08-24 LAB — MAGNESIUM: Magnesium:MCnc:Pt:Ser/Plas:Qn:: 1.6

## 2019-08-24 LAB — SMEAR REVIEW

## 2019-08-24 LAB — PHOSPHORUS: Phosphate:MCnc:Pt:Ser/Plas:Qn:: 3.6

## 2019-08-24 LAB — CHLORIDE: Chloride:SCnc:Pt:Ser/Plas:Qn:: 104

## 2019-08-24 LAB — MONOCYTES ABSOLUTE COUNT: Monocytes:NCnc:Pt:Bld:Qn:Automated count: 0.2

## 2019-08-25 LAB — TACROLIMUS, TROUGH: Lab: 5.3

## 2019-08-26 LAB — COMPREHENSIVE METABOLIC PANEL
ALKALINE PHOSPHATASE: 80 U/L
ALT (SGPT): 20 U/L
AST (SGOT): 18 U/L
BILIRUBIN TOTAL: 0.3 mg/dL
BLOOD UREA NITROGEN: 29 mg/dL — ABNORMAL HIGH
CALCIUM: 9.5 mg/dL
CO2: 4.5 mmol/L
CREATININE: 1.9 mg/dL — ABNORMAL HIGH
EGFR CKD-EPI NON-AA MALE: 41 mL/min/{1.73_m2} — ABNORMAL LOW
GLUCOSE RANDOM: 85 mg/dL
POTASSIUM: 85 mmol/L
PROTEIN TOTAL: 6.8 g/dL
SODIUM: 140 mmol/L

## 2019-08-26 LAB — LIPID PANEL
CHOLESTEROL/HDL RATIO SCREEN: 3.2
HDL CHOLESTEROL: 47 mg/dL
LDL CHOLESTEROL CALCULATED: 84 mg/dL

## 2019-08-26 LAB — HYPERCHROMASIA: Lab: 0

## 2019-08-26 LAB — CBC W/ DIFFERENTIAL
BASOPHILS ABSOLUTE COUNT: 0 10*9/L
BASOPHILS RELATIVE PERCENT: 1.3 %
EOSINOPHILS ABSOLUTE COUNT: 0.1 10*9/L
EOSINOPHILS RELATIVE PERCENT: 3.2 %
HEMATOCRIT: 35.2 % — ABNORMAL LOW
HEMOGLOBIN: 11.3 g/dL — ABNORMAL LOW
LYMPHOCYTES ABSOLUTE COUNT: 0.4 10*9/L — ABNORMAL LOW
MEAN CORPUSCULAR HEMOGLOBIN CONC: 32.1 g/dL
MEAN CORPUSCULAR HEMOGLOBIN: 31.4 pg — ABNORMAL HIGH
MEAN CORPUSCULAR VOLUME: 97.8 fL
MEAN PLATELET VOLUME: 9.8 fL
MONOCYTES ABSOLUTE COUNT: 0.2 10*9/L
MONOCYTES RELATIVE PERCENT: 12.1 %
NEUTROPHILS ABSOLUTE COUNT: 0.8 10*9/L — ABNORMAL LOW
NEUTROPHILS RELATIVE PERCENT: 52.2 %
RED BLOOD CELL COUNT: 3.6 10*12/L — ABNORMAL LOW
RED CELL DISTRIBUTION WIDTH: 13.5 %
WBC ADJUSTED: 1.6 10*9/L

## 2019-08-26 LAB — LDL CHOLESTEROL CALCULATED: Lab: 84

## 2019-08-26 LAB — ALKALINE PHOSPHATASE: Lab: 80

## 2019-08-27 NOTE — Unmapped (Signed)
error 

## 2019-08-27 NOTE — Unmapped (Signed)
Reviewed with Dr. Toni Arthurs pt WBC from yesterday 1.6, pt will get Granix x 1 dose. Therapy plan entered. Message sent to clinic staff.     Also updated administration interval for Belatacept.

## 2019-08-28 ENCOUNTER — Encounter: Admit: 2019-08-28 | Discharge: 2019-08-29 | Payer: PRIVATE HEALTH INSURANCE

## 2019-08-28 DIAGNOSIS — D72819 Decreased white blood cell count, unspecified: Principal | ICD-10-CM

## 2019-08-28 DIAGNOSIS — Z940 Kidney transplant status: Principal | ICD-10-CM

## 2019-08-28 DIAGNOSIS — D899 Disorder involving the immune mechanism, unspecified: Principal | ICD-10-CM

## 2019-08-28 NOTE — Unmapped (Signed)
Pt here for Granix injection. Per Dr. Toni Arthurs, pt given Granix 480 mcg subcutaneous in left arm, pt tolerated well.

## 2019-08-31 ENCOUNTER — Encounter: Admit: 2019-08-31 | Discharge: 2019-09-01 | Payer: PRIVATE HEALTH INSURANCE

## 2019-08-31 LAB — CBC W/ AUTO DIFF
BASOPHILS ABSOLUTE COUNT: 0 10*9/L (ref 0.0–0.1)
BASOPHILS RELATIVE PERCENT: 0.9 %
EOSINOPHILS ABSOLUTE COUNT: 0.1 10*9/L (ref 0.0–0.7)
EOSINOPHILS RELATIVE PERCENT: 4.1 %
HEMATOCRIT: 34.9 % — ABNORMAL LOW (ref 38.0–50.0)
HEMOGLOBIN: 11.9 g/dL — ABNORMAL LOW (ref 13.5–17.5)
LYMPHOCYTES ABSOLUTE COUNT: 0.6 10*9/L — ABNORMAL LOW (ref 0.7–4.0)
MEAN CORPUSCULAR HEMOGLOBIN CONC: 34 g/dL (ref 30.0–36.0)
MEAN CORPUSCULAR HEMOGLOBIN: 32 pg (ref 26.0–34.0)
MEAN PLATELET VOLUME: 7.5 fL (ref 7.0–10.0)
MONOCYTES ABSOLUTE COUNT: 0.3 10*9/L (ref 0.1–1.0)
MONOCYTES RELATIVE PERCENT: 21.3 %
NEUTROPHILS ABSOLUTE COUNT: 0.6 10*9/L — ABNORMAL LOW (ref 1.7–7.7)
NEUTROPHILS RELATIVE PERCENT: 39.3 %
NUCLEATED RED BLOOD CELLS: 0 /100{WBCs} (ref <=4)
PLATELET COUNT: 242 10*9/L (ref 150–450)
RED BLOOD CELL COUNT: 3.71 10*12/L — ABNORMAL LOW (ref 4.32–5.72)
RED CELL DISTRIBUTION WIDTH: 14.3 % (ref 12.0–15.0)
WBC ADJUSTED: 1.6 10*9/L — ABNORMAL LOW (ref 3.5–10.5)

## 2019-08-31 LAB — BASIC METABOLIC PANEL
ANION GAP: 15 mmol/L (ref 7–15)
BLOOD UREA NITROGEN: 28 mg/dL — ABNORMAL HIGH (ref 7–21)
BUN / CREAT RATIO: 15
CALCIUM: 9.9 mg/dL (ref 8.5–10.2)
CHLORIDE: 106 mmol/L (ref 98–107)
CREATININE: 1.89 mg/dL — ABNORMAL HIGH (ref 0.70–1.30)
EGFR CKD-EPI AA MALE: 52 mL/min/{1.73_m2} — ABNORMAL LOW (ref >=60)
EGFR CKD-EPI NON-AA MALE: 45 mL/min/{1.73_m2} — ABNORMAL LOW (ref >=60)
GLUCOSE RANDOM: 98 mg/dL (ref 70–179)
POTASSIUM: 4.6 mmol/L (ref 3.5–5.0)
SODIUM: 142 mmol/L (ref 135–145)

## 2019-08-31 LAB — PHOSPHORUS: Phosphate:MCnc:Pt:Ser/Plas:Qn:: 3.3

## 2019-08-31 LAB — MAGNESIUM: Magnesium:MCnc:Pt:Ser/Plas:Qn:: 1.8

## 2019-08-31 LAB — BASOPHILS ABSOLUTE COUNT: Basophils:NCnc:Pt:Bld:Qn:Automated count: 0

## 2019-08-31 LAB — CO2: Carbon dioxide:SCnc:Pt:Ser/Plas:Qn:: 21 — ABNORMAL LOW

## 2019-08-31 NOTE — Unmapped (Signed)
Reviewed WBC from today 1.6 with Dr. Toni Arthurs, 1 dose of Granix ordered. Therapy plan entered. Pt made aware. Message sent to scheduler.

## 2019-08-31 NOTE — Unmapped (Signed)
[facility] transplant nephrology clinic visit    assessment and plan  1. s/p kidney transplant 04/28/2019. baseline creatinine 1.5-1.8 mg/dl. no proteinuria. no donor specific hla ab detected.   2. immunosuppression. tacrolimus xl decr to 9mg  daily; plan belatacept conversion d/t calcineurin inh cns toxicity symptoms incl tremor, decr focus, msk pain and variable levels.  3. hypertension. hold clonidine. plan transition from hydralazine to ace inh following tacrolimus discontinuation. blood pressure goal <130/80 mmhg.  4. preventive medicine. valganciclovir 450mg  daily x72m 11/20-2/21. trimethoprim/sulfamethoxazole 80/400mg  3x/wk x27m 11/20-5/21. influenza '20. pcv13 pnuemococcal '19. ppsv23 pneumococcal '19. covid-19 vaccine recommended at 54m post transplant.     history of present illness    [name] is a 34 year old gentleman seen in follow up post kidney transplant 04/28/2019. post transplant course complicated with labile tacrolimus lvls/with significant delay in receiving lvls from [facility]. kidney bx performed 1/21 no acute rejection; mild-mod arteriosclerosis. no fever chills or sweats. +occ headache. +decreased focus/concentration. no chest pain cough or shortness of breath. no lower extremity edema. appetite nl. +chr intermittent right lower quadrant abdominal pain with no definite precipitating or modifying factors. no n/v/d. +polyarthralgias. +tremor. no dysuria hematuria or difficulty voiding. all other systems reviewed and negative x10 systems.    past medical hx:  1. 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 baseline creatinine 1.5-1.8 mg/dl.   > kidney bx 01/21: no acute/chr rejection.  2. hypertension    allergies: nkda    medications: tacrolimus xl 10mg  daily, mycophenolate 360mg  bid, prednisone 5mg  daily, aspirin 81mg  daily, amlodipine 10mg  daily, hydralazine 100mg  bid, clonidine 0.1mg  daily, escitalopram 15mg  daily, buspirone 15mg  bid, alprazolam 0.5mg  bid, gabapentin 200mg  tid, mg oxide 133mg  daily, omeprazole 20mg  bid, trimethoprim/sulfamethoxazole 80/400mg  3x/wk, valganciclovir 450mg  daily.    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 +chr marijuana use.    physical exam: t98 p72 bp125/77 wt73kg bmi 21.8. wd/wn gentleman appropriate affect and mood. sclera anicteric. wearing mask. neck supple no palpable ln. heart rrr nl s1s2 no m/r/g. lungs clear bilateral. abd soft nt/nd. no lower ext edema. msk no synovitis or tophi. skin no rash. neuro alert oriented non focal exam.    labs 07/24/19: wbc2.6 hgb9.6 hgb30.2 plts193. KG401 k3.9 cl107 bicarb23 bun32 cr1.7 glc108 ca9.1 mg1.8 phos3.6. tacrolimus lvl 13.1 ng/ml.

## 2019-09-01 ENCOUNTER — Encounter: Admit: 2019-09-01 | Discharge: 2019-09-02 | Payer: PRIVATE HEALTH INSURANCE

## 2019-09-01 DIAGNOSIS — D899 Disorder involving the immune mechanism, unspecified: Principal | ICD-10-CM

## 2019-09-01 DIAGNOSIS — D72819 Decreased white blood cell count, unspecified: Principal | ICD-10-CM

## 2019-09-01 DIAGNOSIS — Z940 Kidney transplant status: Principal | ICD-10-CM

## 2019-09-01 LAB — TACROLIMUS, TROUGH: Lab: 21.4

## 2019-09-01 NOTE — Unmapped (Signed)
Received critical tacrolimus level of 21.4 drawn 08/31/2019 at 1013.  Called patient and he stated he took his tac around 0800 yesterday so not a true trough.  While speaking to him he also stated that he accidentally took his AM meds lats night, which means he had 9mg  tacrolimus twice yesterday.  He was told to not take it today and that his primary coordinator would follow up with him.  This message routed to primary coordinator.

## 2019-09-03 ENCOUNTER — Encounter: Admit: 2019-09-03 | Discharge: 2019-09-03 | Payer: PRIVATE HEALTH INSURANCE

## 2019-09-03 DIAGNOSIS — Z79899 Other long term (current) drug therapy: Principal | ICD-10-CM

## 2019-09-03 DIAGNOSIS — Z940 Kidney transplant status: Principal | ICD-10-CM

## 2019-09-03 LAB — CBC W/ AUTO DIFF
BASOPHILS ABSOLUTE COUNT: 0 10*9/L (ref 0.0–0.1)
EOSINOPHILS ABSOLUTE COUNT: 0.1 10*9/L (ref 0.0–0.7)
HEMATOCRIT: 37.8 % — ABNORMAL LOW (ref 38.0–50.0)
HEMOGLOBIN: 12.2 g/dL — ABNORMAL LOW (ref 13.5–17.5)
LYMPHOCYTES ABSOLUTE COUNT: 0.7 10*9/L (ref 0.7–4.0)
LYMPHOCYTES RELATIVE PERCENT: 19.6 %
MEAN CORPUSCULAR HEMOGLOBIN CONC: 32.2 g/dL (ref 30.0–36.0)
MEAN CORPUSCULAR HEMOGLOBIN: 30.6 pg (ref 26.0–34.0)
MEAN CORPUSCULAR VOLUME: 94.9 fL (ref 81.0–95.0)
MEAN PLATELET VOLUME: 7.5 fL (ref 7.0–10.0)
MONOCYTES RELATIVE PERCENT: 29.2 %
NEUTROPHILS ABSOLUTE COUNT: 1.7 10*9/L (ref 1.7–7.7)
NEUTROPHILS RELATIVE PERCENT: 47.8 %
PLATELET COUNT: 231 10*9/L (ref 150–450)
RED BLOOD CELL COUNT: 3.99 10*12/L — ABNORMAL LOW (ref 4.32–5.72)
RED CELL DISTRIBUTION WIDTH: 14.2 % (ref 12.0–15.0)
WBC ADJUSTED: 3.7 10*9/L (ref 3.5–10.5)

## 2019-09-03 LAB — SMEAR REVIEW

## 2019-09-03 LAB — TACROLIMUS, TROUGH: Lab: 7.4

## 2019-09-03 LAB — MAGNESIUM: Magnesium:MCnc:Pt:Ser/Plas:Qn:: 1.7

## 2019-09-03 LAB — BASIC METABOLIC PANEL
ANION GAP: 5 mmol/L (ref 3–11)
BLOOD UREA NITROGEN: 26 mg/dL — ABNORMAL HIGH (ref 9–23)
BUN / CREAT RATIO: 14
CALCIUM: 10.2 mg/dL (ref 8.7–10.4)
CHLORIDE: 110 mmol/L — ABNORMAL HIGH (ref 98–107)
CO2: 26 mmol/L (ref 20.0–31.0)
CREATININE: 1.88 mg/dL — ABNORMAL HIGH (ref 0.60–1.10)
EGFR CKD-EPI AA MALE: 52 mL/min/{1.73_m2}
EGFR CKD-EPI NON-AA MALE: 45 mL/min/{1.73_m2}
GLUCOSE RANDOM: 103 mg/dL (ref 70–179)
POTASSIUM: 4.1 mmol/L (ref 3.5–5.1)

## 2019-09-03 LAB — PHOSPHORUS
PHOSPHORUS: 3.3 mg/dL (ref 2.4–5.1)
Phosphate:MCnc:Pt:Ser/Plas:Qn:: 3.3

## 2019-09-03 LAB — GLUCOSE RANDOM: Glucose:MCnc:Pt:Ser/Plas:Qn:: 103

## 2019-09-03 LAB — RED CELL DISTRIBUTION WIDTH: Lab: 14.2

## 2019-09-03 NOTE — Unmapped (Signed)
1610: Patient arrived Transplant Infusion Room today for Belatacept, Condition: well; Mobility: ambulating; accompanied by self.   1002: VS stable,  1003: PIV placed, NS KVO; labs collected and sent.  1004: Infusion initiated.  1039: Infusion complete.  1040: VS stable, PIV removed, pt left clinic, Condition: well; Mobility: ambulating; accompanied by self.  See Flowsheet and MAR for all details of visit.

## 2019-09-07 ENCOUNTER — Encounter: Admit: 2019-09-07 | Discharge: 2019-09-08 | Payer: PRIVATE HEALTH INSURANCE

## 2019-09-07 LAB — BASIC METABOLIC PANEL
ANION GAP: 12 mmol/L (ref 7–15)
BLOOD UREA NITROGEN: 28 mg/dL — ABNORMAL HIGH (ref 7–21)
CALCIUM: 9.6 mg/dL (ref 8.5–10.2)
CHLORIDE: 107 mmol/L (ref 98–107)
CO2: 24 mmol/L (ref 22.0–30.0)
CREATININE: 1.74 mg/dL — ABNORMAL HIGH (ref 0.70–1.30)
EGFR CKD-EPI AA MALE: 57 mL/min/{1.73_m2} — ABNORMAL LOW (ref >=60)
GLUCOSE RANDOM: 84 mg/dL (ref 70–179)
POTASSIUM: 5.1 mmol/L — ABNORMAL HIGH (ref 3.5–5.0)
SODIUM: 143 mmol/L (ref 135–145)

## 2019-09-07 LAB — PHOSPHORUS: Phosphate:MCnc:Pt:Ser/Plas:Qn:: 3.3

## 2019-09-07 LAB — CBC W/ AUTO DIFF
BASOPHILS ABSOLUTE COUNT: 0 10*9/L (ref 0.0–0.1)
BASOPHILS RELATIVE PERCENT: 0.8 %
EOSINOPHILS ABSOLUTE COUNT: 0.1 10*9/L (ref 0.0–0.7)
EOSINOPHILS RELATIVE PERCENT: 3.4 %
HEMOGLOBIN: 11.9 g/dL — ABNORMAL LOW (ref 13.5–17.5)
LYMPHOCYTES ABSOLUTE COUNT: 0.6 10*9/L — ABNORMAL LOW (ref 0.7–4.0)
LYMPHOCYTES RELATIVE PERCENT: 21 %
MEAN CORPUSCULAR HEMOGLOBIN CONC: 32.9 g/dL (ref 30.0–36.0)
MEAN CORPUSCULAR VOLUME: 93.2 fL (ref 81.0–95.0)
MEAN PLATELET VOLUME: 7.5 fL (ref 7.0–10.0)
MONOCYTES ABSOLUTE COUNT: 0.6 10*9/L (ref 0.1–1.0)
MONOCYTES RELATIVE PERCENT: 20.6 %
NEUTROPHILS ABSOLUTE COUNT: 1.5 10*9/L — ABNORMAL LOW (ref 1.7–7.7)
NEUTROPHILS RELATIVE PERCENT: 54.2 %
NUCLEATED RED BLOOD CELLS: 0 /100{WBCs} (ref <=4)
PLATELET COUNT: 212 10*9/L (ref 150–450)
RED BLOOD CELL COUNT: 3.87 10*12/L — ABNORMAL LOW (ref 4.32–5.72)
RED CELL DISTRIBUTION WIDTH: 14.5 % (ref 12.0–15.0)
WBC ADJUSTED: 2.7 10*9/L — ABNORMAL LOW (ref 3.5–10.5)

## 2019-09-07 LAB — PLATELET COUNT: Platelets:NCnc:Pt:Bld:Qn:Automated count: 212

## 2019-09-07 LAB — GLUCOSE RANDOM: Glucose:MCnc:Pt:Ser/Plas:Qn:: 84

## 2019-09-07 LAB — MAGNESIUM: Magnesium:MCnc:Pt:Ser/Plas:Qn:: 1.8

## 2019-09-07 NOTE — Unmapped (Signed)
Change in Envarsus dosage decrease. Co-pay $0.00.

## 2019-09-09 NOTE — Unmapped (Signed)
[facility] Refill Coordination Note    Specialty Medication(s) to be Shipped:   Transplant: Prednisone 5mg   **Weening off of envarsus; pt will call if he needs any**      Other medication(s) to be shipped: amlodipine 10mg , aspirin 81mg , omeprazole 20mg  and bactrim     [name], DOB: [birth year]  Phone: [phone] (home)       All above HIPAA information was verified with patient.     Was a Nurse, learning disability used for this call? No    Completed refill call assessment today to schedule patient's medication shipment from the [facility] [phone]).       Specialty medication(s) and dose(s) confirmed: Regimen is correct and unchanged.   Changes to medications: Teddrick reports no changes at this time.  Changes to insurance: No  Questions for the pharmacist: No    Confirmed patient received Welcome Packet with first shipment. The patient will receive a drug information handout for each medication shipped and additional FDA Medication Guides as required.       DISEASE/MEDICATION-SPECIFIC INFORMATION        N/A    SPECIALTY MEDICATION ADHERENCE     Medication Adherence    Patient reported X missed doses in the last month: 0  Specialty Medication: Prednisone 5mg   Patient is on additional specialty medications: No          Prednisone 5 mg: 8 days of medicine on hand     SHIPPING     Shipping address confirmed in Epic.     Delivery Scheduled: Yes, Expected medication delivery date: 09/16/2019.     Medication will be delivered via UPS to the prescription address in Epic [facility].    [name] [facility] Specialty Technician

## 2019-09-10 ENCOUNTER — Encounter: Admit: 2019-09-10 | Discharge: 2019-09-11 | Payer: PRIVATE HEALTH INSURANCE

## 2019-09-10 LAB — BASIC METABOLIC PANEL
ANION GAP: 13 mmol/L (ref 7–15)
BLOOD UREA NITROGEN: 27 mg/dL — ABNORMAL HIGH (ref 7–21)
BUN / CREAT RATIO: 13
CHLORIDE: 108 mmol/L — ABNORMAL HIGH (ref 98–107)
CO2: 23 mmol/L (ref 22.0–30.0)
CREATININE: 2.05 mg/dL — ABNORMAL HIGH (ref 0.70–1.30)
EGFR CKD-EPI AA MALE: 47 mL/min/{1.73_m2} — ABNORMAL LOW (ref >=60)
EGFR CKD-EPI NON-AA MALE: 41 mL/min/{1.73_m2} — ABNORMAL LOW (ref >=60)
GLUCOSE RANDOM: 99 mg/dL (ref 70–179)
SODIUM: 144 mmol/L (ref 135–145)

## 2019-09-10 LAB — CBC W/ AUTO DIFF
BASOPHILS ABSOLUTE COUNT: 0 10*9/L (ref 0.0–0.1)
EOSINOPHILS ABSOLUTE COUNT: 0.1 10*9/L (ref 0.0–0.7)
EOSINOPHILS RELATIVE PERCENT: 2.6 %
HEMATOCRIT: 37 % — ABNORMAL LOW (ref 38.0–50.0)
HEMOGLOBIN: 12.2 g/dL — ABNORMAL LOW (ref 13.5–17.5)
LYMPHOCYTES ABSOLUTE COUNT: 0.8 10*9/L (ref 0.7–4.0)
LYMPHOCYTES RELATIVE PERCENT: 23.4 %
MEAN CORPUSCULAR HEMOGLOBIN CONC: 32.9 g/dL (ref 30.0–36.0)
MEAN CORPUSCULAR HEMOGLOBIN: 30.7 pg (ref 26.0–34.0)
MEAN CORPUSCULAR VOLUME: 93.3 fL (ref 81.0–95.0)
MEAN PLATELET VOLUME: 7.5 fL (ref 7.0–10.0)
MONOCYTES ABSOLUTE COUNT: 0.6 10*9/L (ref 0.1–1.0)
MONOCYTES RELATIVE PERCENT: 17.5 %
NEUTROPHILS ABSOLUTE COUNT: 1.8 10*9/L (ref 1.7–7.7)
RED BLOOD CELL COUNT: 3.96 10*12/L — ABNORMAL LOW (ref 4.32–5.72)
RED CELL DISTRIBUTION WIDTH: 14.3 % (ref 12.0–15.0)
WBC ADJUSTED: 3.3 10*9/L — ABNORMAL LOW (ref 3.5–10.5)

## 2019-09-10 LAB — PHOSPHORUS: Phosphate:MCnc:Pt:Ser/Plas:Qn:: 3.4

## 2019-09-10 LAB — WBC ADJUSTED: Leukocytes:NCnc:Pt:Bld:Qn:: 3.3 — ABNORMAL LOW

## 2019-09-10 LAB — POTASSIUM: Potassium:SCnc:Pt:Ser/Plas:Qn:: 5.1 — ABNORMAL HIGH

## 2019-09-10 LAB — MAGNESIUM: Magnesium:MCnc:Pt:Ser/Plas:Qn:: 1.9

## 2019-09-14 ENCOUNTER — Encounter: Admit: 2019-09-14 | Discharge: 2019-09-15 | Payer: PRIVATE HEALTH INSURANCE

## 2019-09-14 LAB — CBC W/ AUTO DIFF
BASOPHILS ABSOLUTE COUNT: 0 10*9/L (ref 0.0–0.1)
EOSINOPHILS ABSOLUTE COUNT: 0.1 10*9/L (ref 0.0–0.7)
EOSINOPHILS RELATIVE PERCENT: 2.4 %
HEMOGLOBIN: 12 g/dL — ABNORMAL LOW (ref 13.5–17.5)
LYMPHOCYTES ABSOLUTE COUNT: 0.6 10*9/L — ABNORMAL LOW (ref 0.7–4.0)
LYMPHOCYTES RELATIVE PERCENT: 21.5 %
MEAN CORPUSCULAR HEMOGLOBIN CONC: 33.5 g/dL (ref 30.0–36.0)
MEAN CORPUSCULAR HEMOGLOBIN: 30.9 pg (ref 26.0–34.0)
MEAN CORPUSCULAR VOLUME: 92.3 fL (ref 81.0–95.0)
MEAN PLATELET VOLUME: 7.3 fL (ref 7.0–10.0)
MONOCYTES ABSOLUTE COUNT: 0.4 10*9/L (ref 0.1–1.0)
MONOCYTES RELATIVE PERCENT: 11.9 %
NEUTROPHILS ABSOLUTE COUNT: 1.9 10*9/L (ref 1.7–7.7)
NEUTROPHILS RELATIVE PERCENT: 63.6 %
PLATELET COUNT: 203 10*9/L (ref 150–450)
RED BLOOD CELL COUNT: 3.88 10*12/L — ABNORMAL LOW (ref 4.32–5.72)
RED CELL DISTRIBUTION WIDTH: 14.4 % (ref 12.0–15.0)
WBC ADJUSTED: 3 10*9/L — ABNORMAL LOW (ref 3.5–10.5)

## 2019-09-14 LAB — PHOSPHORUS: Phosphate:MCnc:Pt:Ser/Plas:Qn:: 3.4

## 2019-09-14 LAB — BASIC METABOLIC PANEL
ANION GAP: 14 mmol/L (ref 7–15)
BUN / CREAT RATIO: 16
CALCIUM: 9.8 mg/dL (ref 8.5–10.2)
CHLORIDE: 105 mmol/L (ref 98–107)
CO2: 24 mmol/L (ref 22.0–30.0)
CREATININE: 1.72 mg/dL — ABNORMAL HIGH (ref 0.70–1.30)
EGFR CKD-EPI AA MALE: 58 mL/min/{1.73_m2} — ABNORMAL LOW (ref >=60)
EGFR CKD-EPI NON-AA MALE: 50 mL/min/{1.73_m2} — ABNORMAL LOW (ref >=60)
GLUCOSE RANDOM: 81 mg/dL (ref 70–179)
POTASSIUM: 4.6 mmol/L (ref 3.5–5.0)
SODIUM: 143 mmol/L (ref 135–145)

## 2019-09-14 LAB — MAGNESIUM: Magnesium:MCnc:Pt:Ser/Plas:Qn:: 1.6

## 2019-09-14 LAB — SMEAR REVIEW

## 2019-09-14 LAB — RED BLOOD CELL COUNT: Lab: 3.88 — ABNORMAL LOW

## 2019-09-14 LAB — BLOOD UREA NITROGEN: Urea nitrogen:MCnc:Pt:Ser/Plas:Qn:: 28 — ABNORMAL HIGH

## 2019-09-15 DIAGNOSIS — Z79899 Other long term (current) drug therapy: Principal | ICD-10-CM

## 2019-09-15 DIAGNOSIS — Z940 Kidney transplant status: Principal | ICD-10-CM

## 2019-09-15 MED FILL — AMLODIPINE 10 MG TABLET: ORAL | 90 days supply | Qty: 90 | Fill #0

## 2019-09-15 MED FILL — AMLODIPINE 10 MG TABLET: 90 days supply | Qty: 90 | Fill #0 | Status: AC

## 2019-09-15 MED FILL — ASPIRIN 81 MG TABLET,DELAYED RELEASE: 30 days supply | Qty: 30 | Fill #4 | Status: AC

## 2019-09-15 MED FILL — ASPIRIN 81 MG TABLET,DELAYED RELEASE: ORAL | 30 days supply | Qty: 30 | Fill #4

## 2019-09-15 MED FILL — SULFAMETHOXAZOLE 400 MG-TRIMETHOPRIM 80 MG TABLET: ORAL | 28 days supply | Qty: 12 | Fill #1

## 2019-09-15 MED FILL — PREDNISONE 5 MG TABLET: 30 days supply | Qty: 30 | Fill #3 | Status: AC

## 2019-09-15 MED FILL — SULFAMETHOXAZOLE 400 MG-TRIMETHOPRIM 80 MG TABLET: 28 days supply | Qty: 12 | Fill #1 | Status: AC

## 2019-09-15 MED FILL — PREDNISONE 5 MG TABLET: ORAL | 30 days supply | Qty: 30 | Fill #3

## 2019-09-17 ENCOUNTER — Encounter: Admit: 2019-09-17 | Discharge: 2019-09-18 | Payer: PRIVATE HEALTH INSURANCE

## 2019-09-17 DIAGNOSIS — Z940 Kidney transplant status: Principal | ICD-10-CM

## 2019-09-17 LAB — CBC W/ AUTO DIFF
BASOPHILS ABSOLUTE COUNT: 0 10*9/L (ref 0.0–0.1)
BASOPHILS RELATIVE PERCENT: 1.1 %
EOSINOPHILS ABSOLUTE COUNT: 0.1 10*9/L (ref 0.0–0.4)
EOSINOPHILS RELATIVE PERCENT: 3.1 %
HEMATOCRIT: 40.6 % — ABNORMAL LOW (ref 41.0–53.0)
HEMOGLOBIN: 12.8 g/dL — ABNORMAL LOW (ref 13.5–17.5)
LARGE UNSTAINED CELLS: 2 % (ref 0–4)
LYMPHOCYTES ABSOLUTE COUNT: 0.4 10*9/L — ABNORMAL LOW (ref 1.5–5.0)
LYMPHOCYTES RELATIVE PERCENT: 15.1 %
MEAN CORPUSCULAR HEMOGLOBIN CONC: 31.6 g/dL (ref 31.0–37.0)
MEAN CORPUSCULAR HEMOGLOBIN: 30.8 pg (ref 26.0–34.0)
MEAN CORPUSCULAR VOLUME: 97.4 fL (ref 80.0–100.0)
MEAN PLATELET VOLUME: 8.3 fL (ref 7.0–10.0)
MONOCYTES ABSOLUTE COUNT: 0.2 10*9/L (ref 0.2–0.8)
MONOCYTES RELATIVE PERCENT: 6.6 %
NEUTROPHILS ABSOLUTE COUNT: 2 10*9/L (ref 2.0–7.5)
NEUTROPHILS RELATIVE PERCENT: 71.8 %
PLATELET COUNT: 221 10*9/L (ref 150–440)
RED BLOOD CELL COUNT: 4.17 10*12/L — ABNORMAL LOW (ref 4.50–5.90)
RED CELL DISTRIBUTION WIDTH: 14.3 % (ref 12.0–15.0)
WBC ADJUSTED: 2.7 10*9/L — ABNORMAL LOW (ref 4.5–11.0)

## 2019-09-17 LAB — BASIC METABOLIC PANEL
BLOOD UREA NITROGEN: 29 mg/dL — ABNORMAL HIGH (ref 7–21)
BUN / CREAT RATIO: 15
CALCIUM: 9.7 mg/dL (ref 8.5–10.2)
CHLORIDE: 107 mmol/L (ref 98–107)
CO2: 27 mmol/L (ref 22.0–30.0)
CREATININE: 1.96 mg/dL — ABNORMAL HIGH (ref 0.70–1.30)
EGFR CKD-EPI NON-AA MALE: 43 mL/min/{1.73_m2} — ABNORMAL LOW (ref >=60)
GLUCOSE RANDOM: 91 mg/dL (ref 70–179)
POTASSIUM: 4.5 mmol/L (ref 3.5–5.0)
SODIUM: 142 mmol/L (ref 135–145)

## 2019-09-17 LAB — URINALYSIS
BACTERIA: NONE SEEN /HPF
BLOOD UA: NEGATIVE
GLUCOSE UA: NEGATIVE
KETONES UA: NEGATIVE
LEUKOCYTE ESTERASE UA: NEGATIVE
NITRITE UA: NEGATIVE
PROTEIN UA: NEGATIVE
SPECIFIC GRAVITY UA: 1.013 (ref 1.003–1.030)
SQUAMOUS EPITHELIAL: <1 /HPF (ref 0–5)
UROBILINOGEN UA: 0.2
WBC UA: 1 /HPF (ref <=2)

## 2019-09-17 LAB — SMEAR REVIEW

## 2019-09-17 LAB — PROTEIN / CREATININE RATIO, URINE: PROTEIN URINE: 17.8 mg/dL

## 2019-09-17 LAB — BLOOD UA: Hemoglobin:PrThr:Pt:Urine:Ord:Test strip: NEGATIVE

## 2019-09-17 LAB — PROTEIN URINE: Protein:MCnc:Pt:Urine:Qn:: 17.8

## 2019-09-17 LAB — MAGNESIUM: Magnesium:MCnc:Pt:Ser/Plas:Qn:: 1.9

## 2019-09-17 LAB — MONOCYTES ABSOLUTE COUNT: Monocytes:NCnc:Pt:Bld:Qn:Automated count: 0.2

## 2019-09-17 LAB — EGFR CKD-EPI NON-AA MALE
Glomerular filtration rate/1.73 sq M.predicted.non black:ArVRat:Pt:Ser/Plas/Bld:Qn:Creatinine-based formula (CKD-EPI): 43 — ABNORMAL LOW

## 2019-09-17 LAB — PHOSPHORUS: Phosphate:MCnc:Pt:Ser/Plas:Qn:: 3.6

## 2019-09-17 IMAGING — CR DG CHEST 2V
2 series · 2 of 2 positions shown · non-contrast
Comparison: None.

CLINICAL DATA: 31-year-old male with chest pain.

EXAM:
CHEST - 2 VIEW

[chest pa]
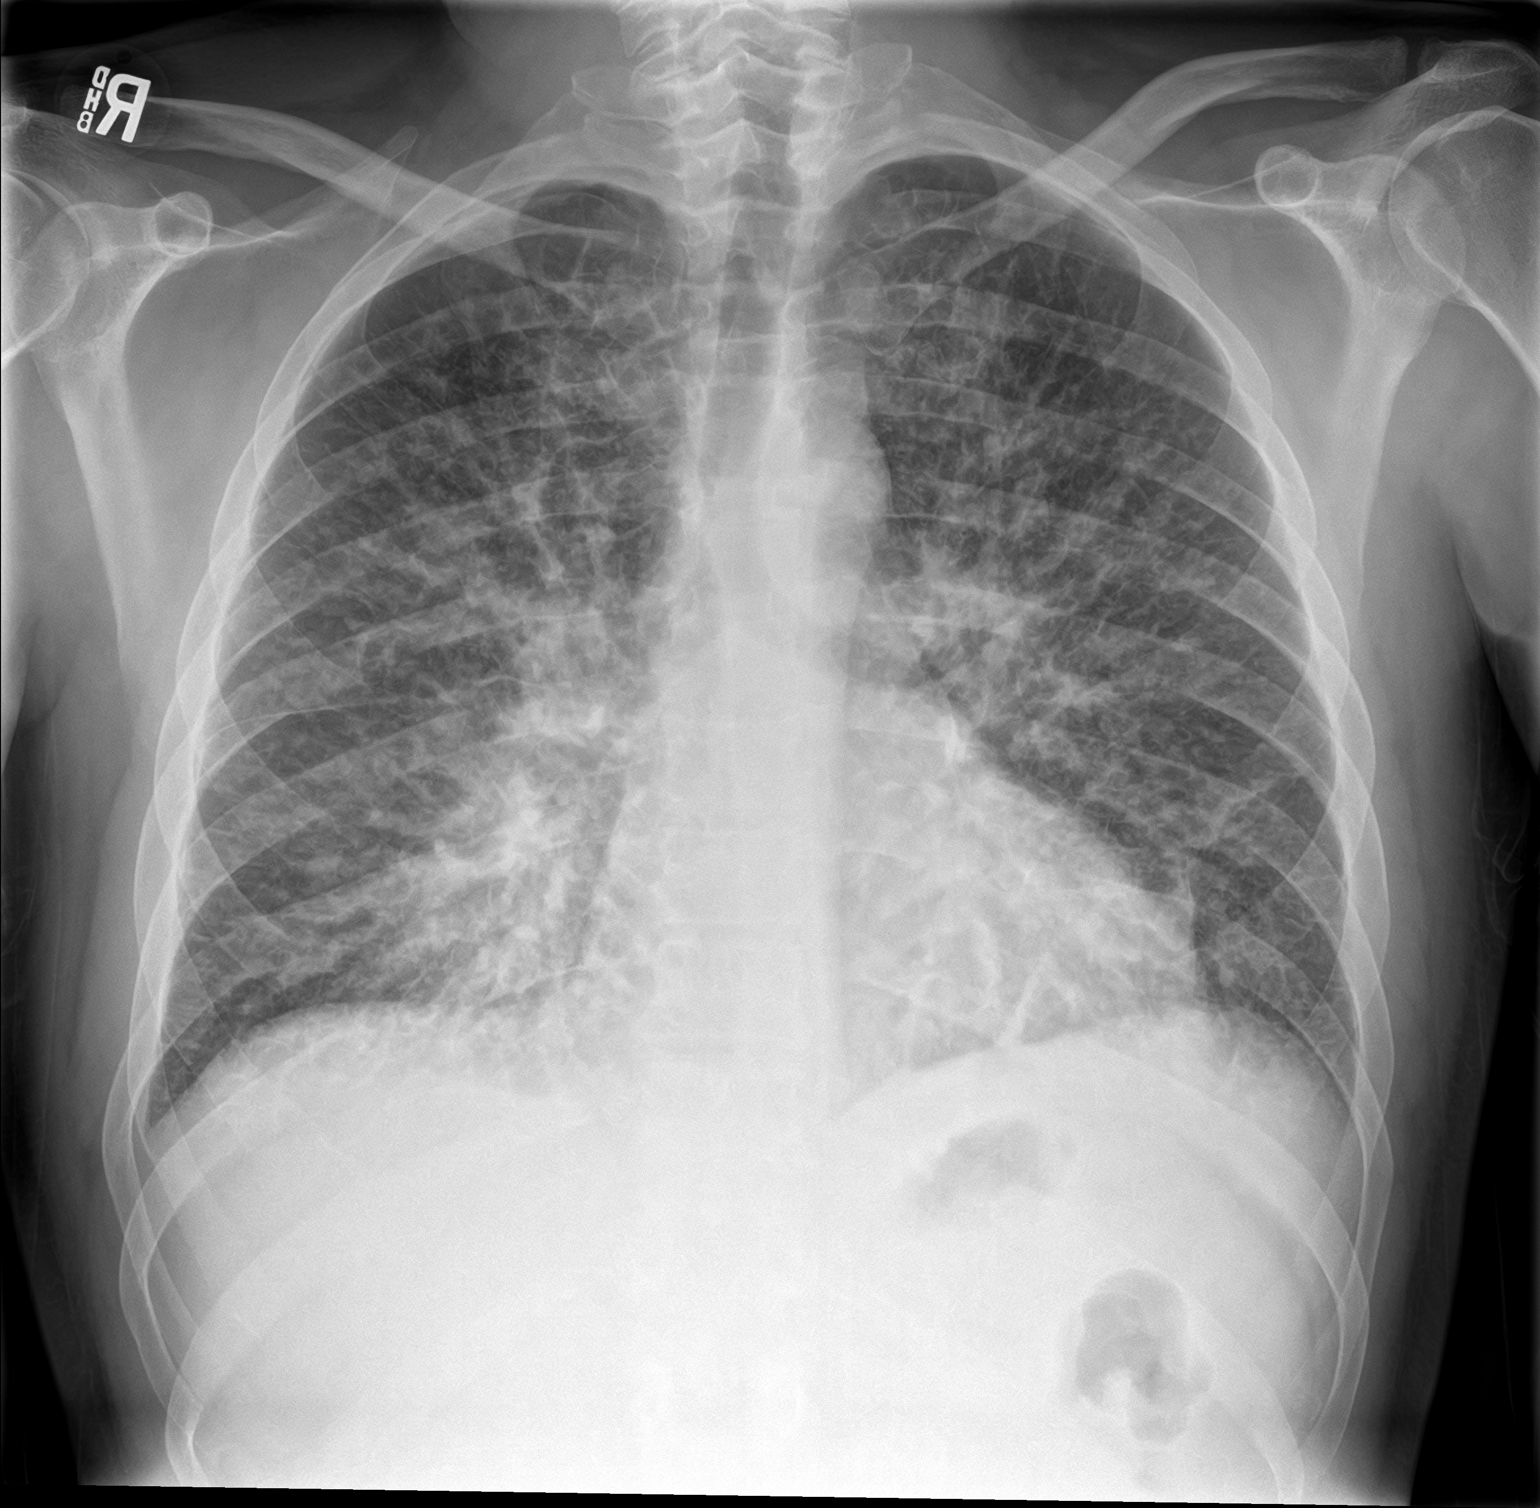

[chest lat]
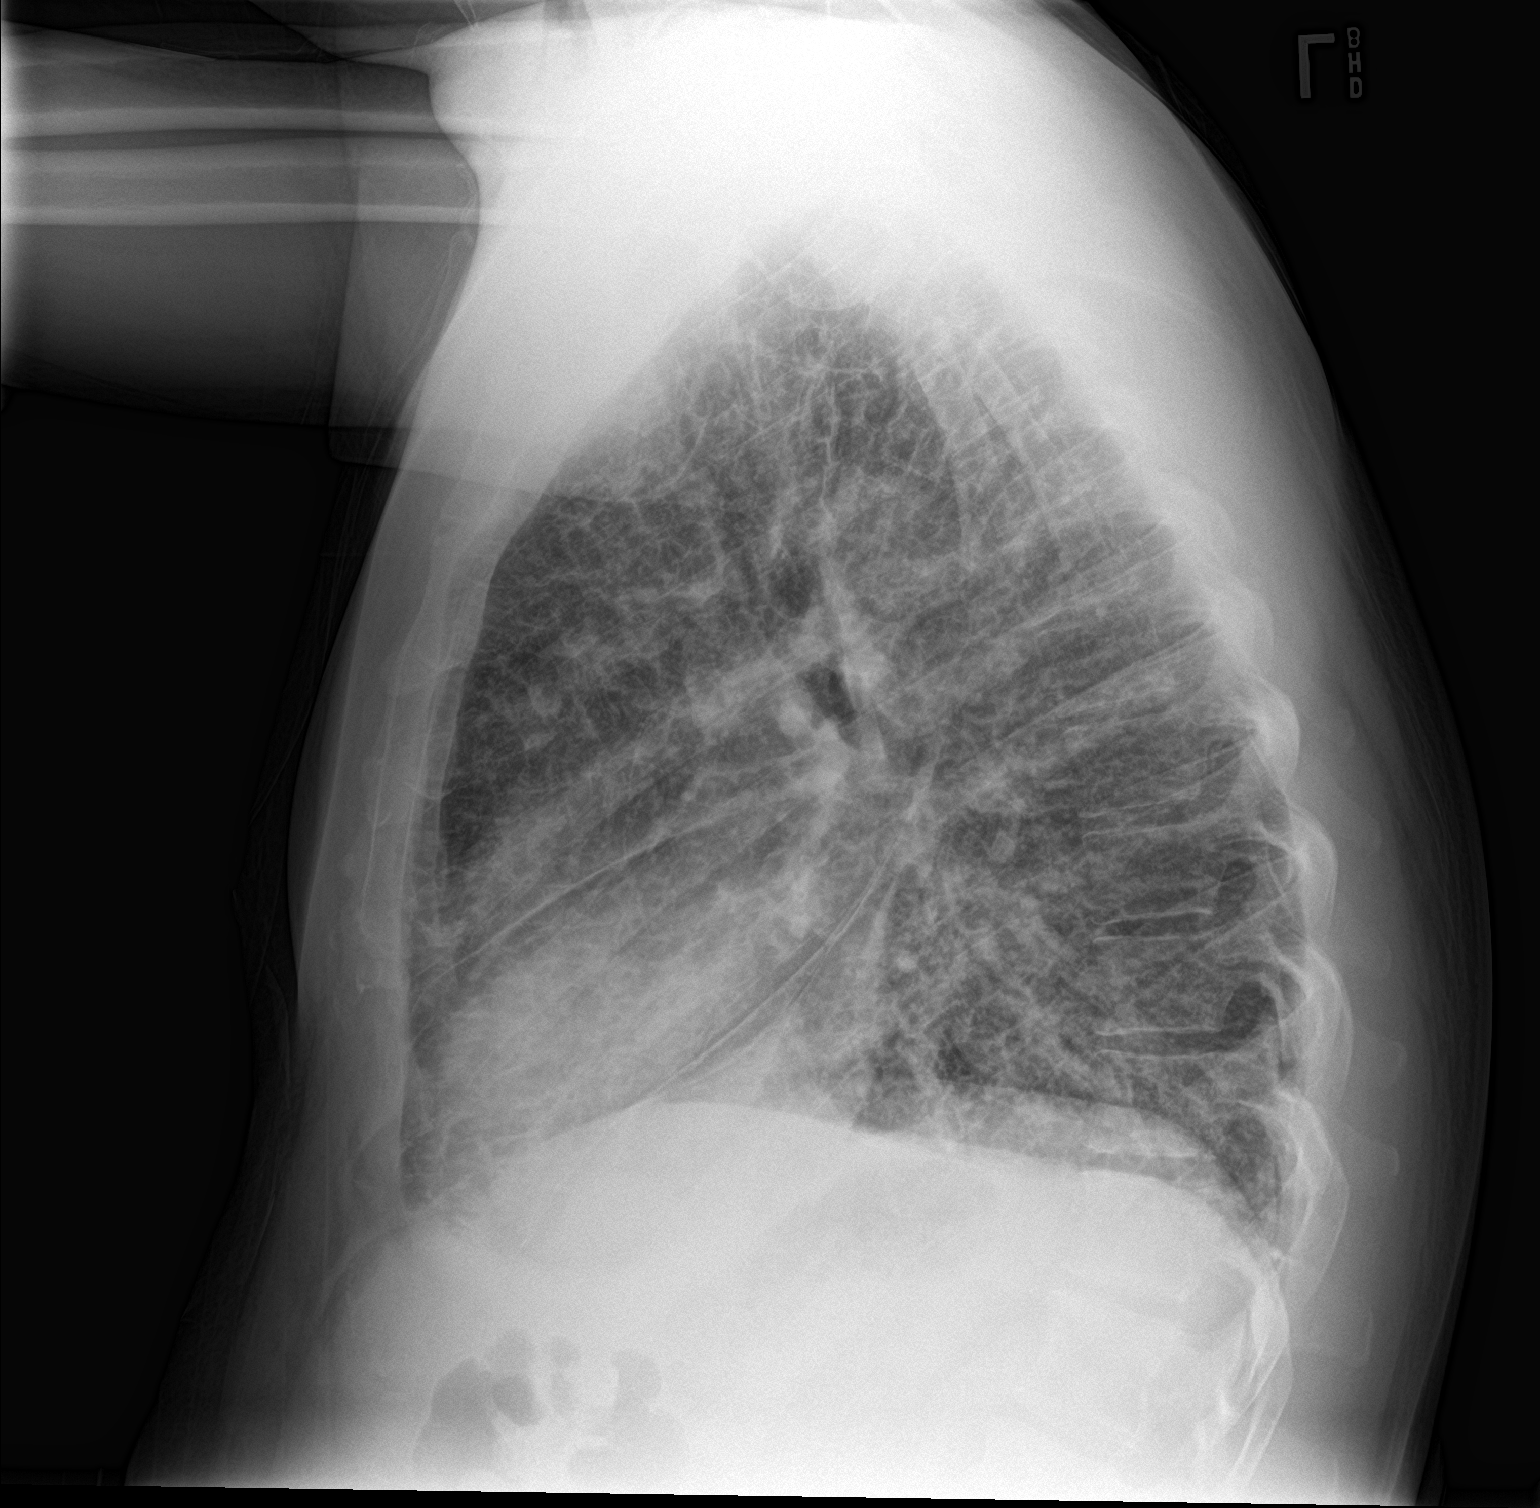

[2 of 2 positions shown; findings below may reference images not displayed]

FINDINGS: There is diffuse interstitial streaky densities and nodularity as
well as Kerley B-lines. Findings consistent with interstitial edema.
Superimposed pneumonia is not entirely excluded. Clinical
correlation is recommended. There are probably trace bilateral
pleural effusions. There is no focal consolidation or pneumothorax.
The cardiac silhouette is within normal limits. No acute osseous
pathology.
IMPRESSION: Interstitial edema. Superimposed pneumonia is not excluded. Clinical
correlation is recommended.

## 2019-09-17 MED ORDER — MYCOPHENOLATE SODIUM 180 MG TABLET,DELAYED RELEASE
ORAL_TABLET | Freq: Two times a day (BID) | ORAL | 11 refills | 30 days | Status: CP
Start: 2019-09-17 — End: 2020-09-16

## 2019-09-17 NOTE — Unmapped (Signed)
0900 Patient arrived Transplant Infusion Room today for belatacept, Condition: well; Mobility: ambulating; accompanied by self.   See Flowsheet and MAR for all details of visit.   0936 VS stable.  0940 PIV placed and secured with coban, NS KVO; labs collected and sent, urine collected and sent.  1018 Infusion initiated.  1048 Infusion complete.  1052 VS stable, PIV removed, pt left clinic, Condition: well; Mobility: ambulating; accompanied by self.

## 2019-09-17 NOTE — Unmapped (Signed)
Received message from Dr. Toni Arthurs, pt to STOP Envarsus as of today, pt receiving 3rd dose of Belatacept today. Confirmed with patient he has stopped Envarsus. Per Dr. Toni Arthurs, pt should restart Myfortic 180mg  BID. Pt made aware, verbalized understanding. Updated prescription sent to [facility].

## 2019-09-18 ENCOUNTER — Encounter
Admit: 2019-09-18 | Discharge: 2019-09-19 | Payer: PRIVATE HEALTH INSURANCE | Attending: Nephrology | Primary: Nephrology

## 2019-09-18 DIAGNOSIS — Z940 Kidney transplant status: Principal | ICD-10-CM

## 2019-09-18 NOTE — Unmapped (Signed)
Change in Mycophenolate dosage decrease. Co-pay $0.00.

## 2019-09-18 NOTE — Unmapped (Signed)
AOBP performed left arm, medium cuff.  1st reading - 132/85, p 83  2nd reading - 120/82, p 78  3rd reading - 112/87, p 72  Average - 121/85, p 78

## 2019-09-21 IMAGING — US US BIOPSY
1 series · 9 of 9 positions shown · non-contrast
Comparison: none

INDICATION: 31-year-old with acute renal failure and on hemodialysis. Concern
for renal vasculitis. Renal biopsy has been requested.

[Series 1: us biopsy · 9 of 9 slices shown]
[im 1/9]
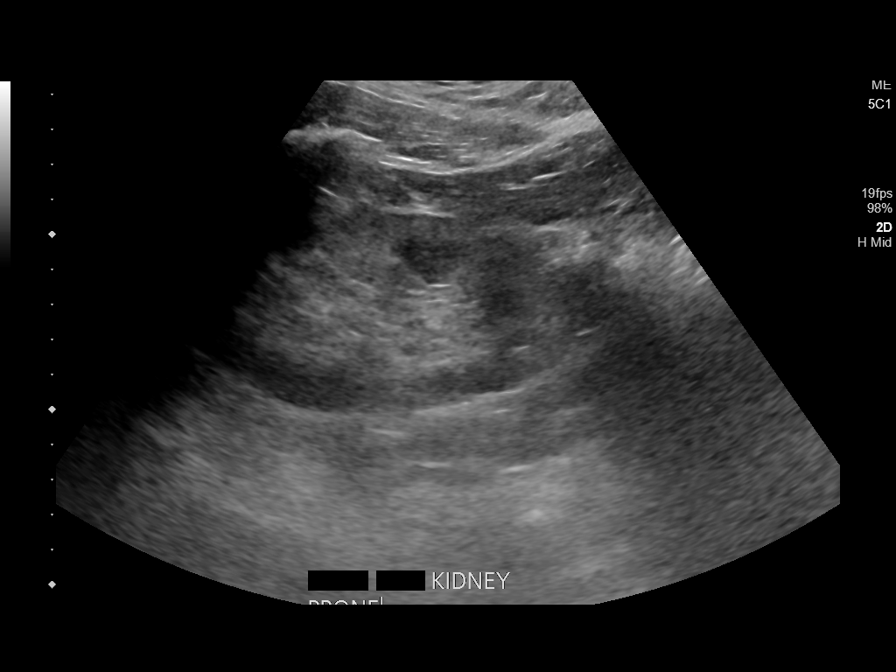
[im 2/9]
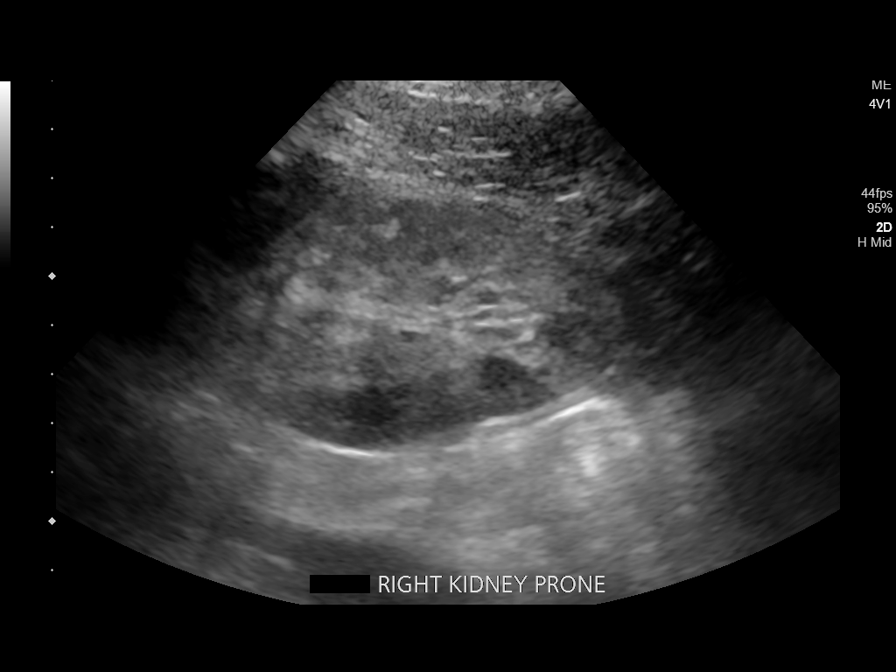
[im 3/9]
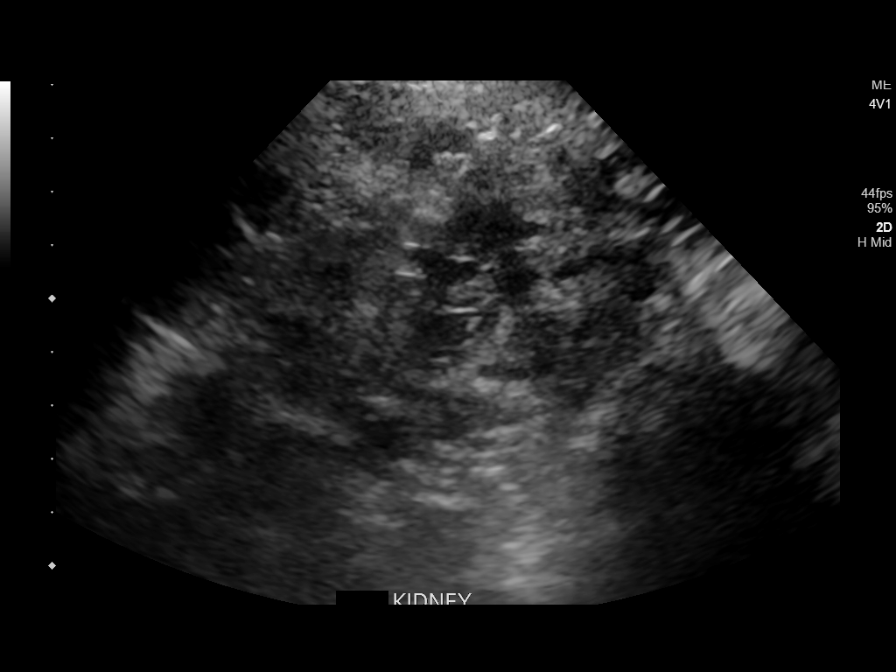
[im 4/9]
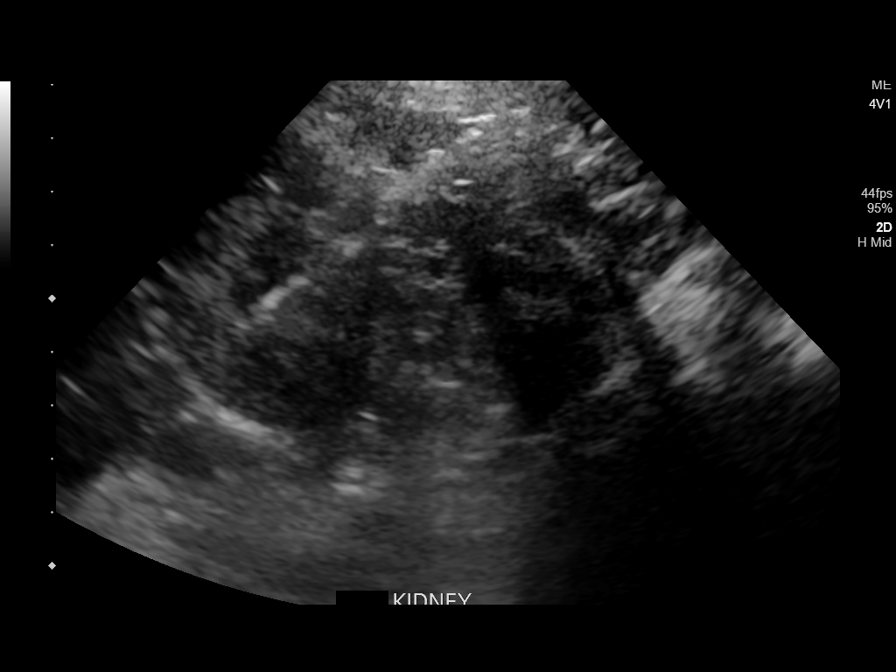
[im 5/9]
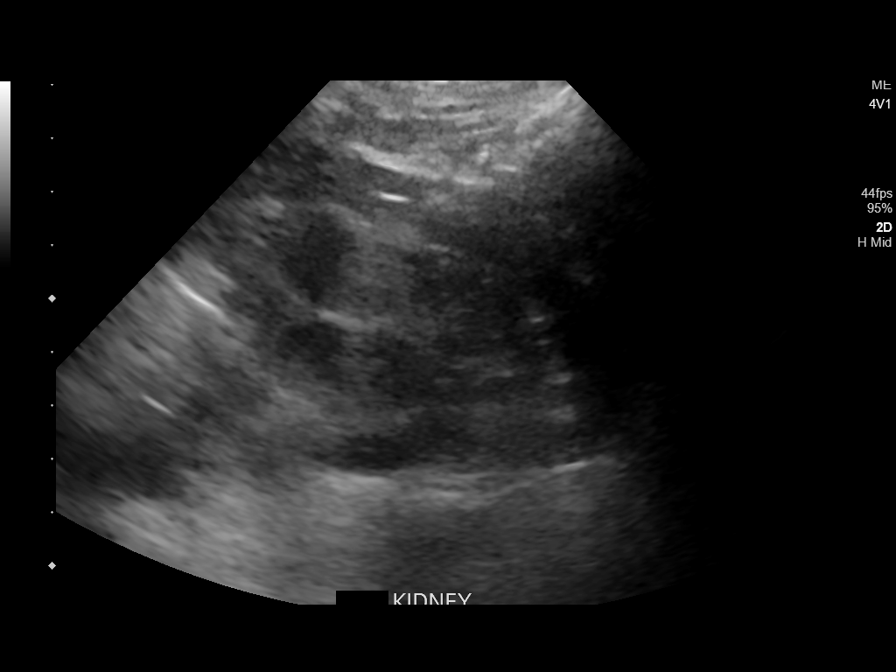
[im 6/9]
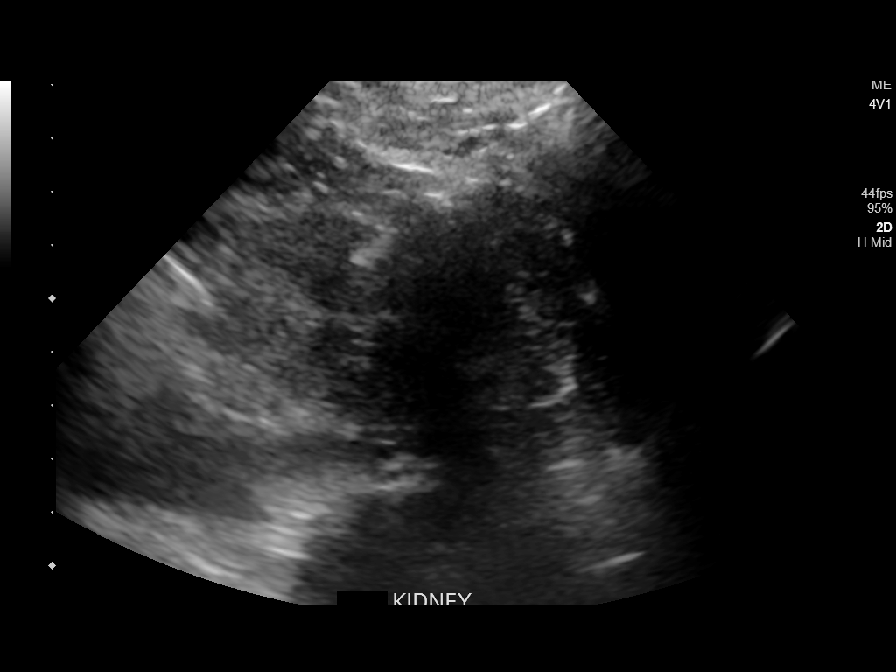
[im 7/9]
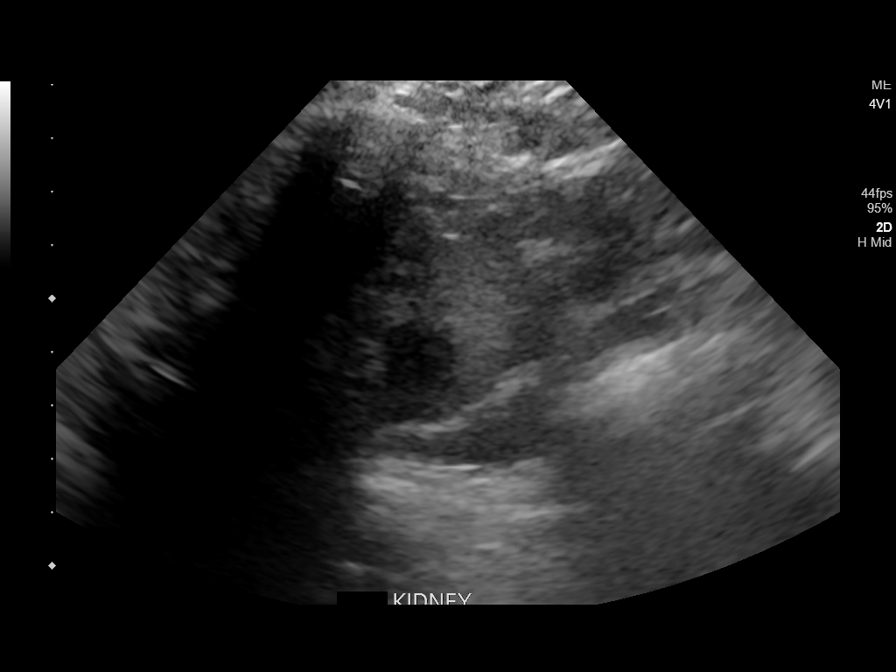
[im 8/9]
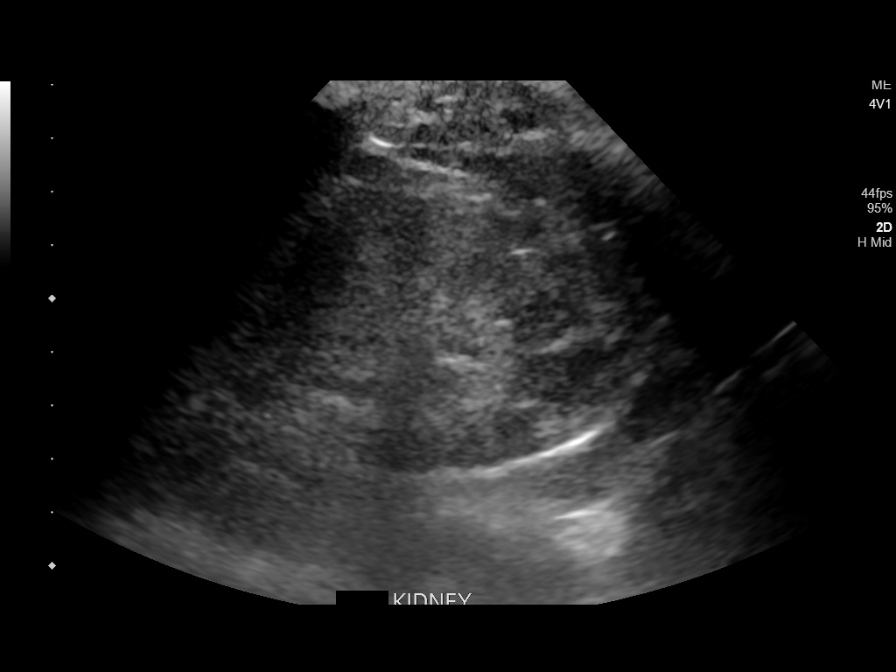
[im 9/9]
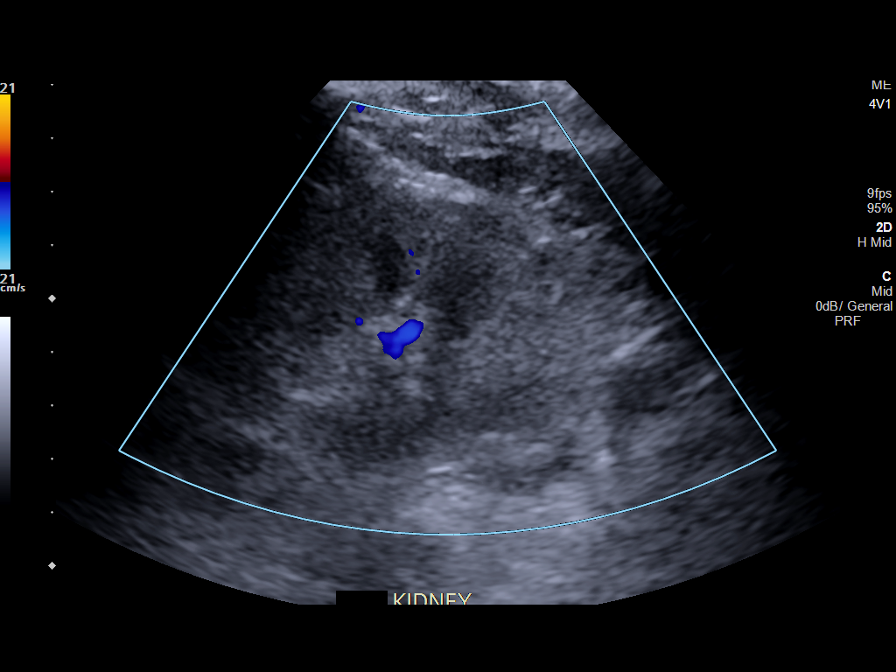

[9 of 9 positions shown; findings below may reference images not displayed]

EXAM:
ULTRASOUND-GUIDED RANDOM LEFT RENAL BIOPSY

MEDICATIONS:
None.

ANESTHESIA/SEDATION:
Moderate (conscious) sedation was employed during this procedure. A
total of Versed 4.5 mg and Fentanyl 200 mcg was administered
intravenously.

Moderate Sedation Time: 29 minutes. The patient's level of
consciousness and vital signs were monitored continuously by
radiology nursing throughout the procedure under my direct
supervision.

FLUOROSCOPY TIME:  None

COMPLICATIONS:
None immediate.

PROCEDURE:
Informed written consent was obtained from the patient after a
thorough discussion of the procedural risks, benefits and
alternatives. All questions were addressed. A timeout was performed
prior to the initiation of the procedure.

Patient was placed prone. Both kidneys were evaluated with
ultrasound. The left kidney was selected for biopsy. The left flank
was prepped with chlorhexidine and a sterile field was created. Skin
and soft tissues were anesthetized with 1% lidocaine. Coaxial needle
was directed towards the left kidney lower pole with ultrasound
guidance. 16 gauge core needle was then used to biopsy the left
kidney lower pole. The first core biopsy had scant material.
Therefore, 2 additional ultrasound-guided core biopsies were
obtained with the 16 gauge device. The second and third core
biopsies were adequate and placed on a Telfa pad with saline. The
coaxial needle was removed. Bandage placed over the puncture site.
FINDINGS: Increased echogenicity in the kidneys. No hydronephrosis. Core
biopsies obtained from the left kidney lower pole. No significant
bleeding or hematoma formation following the core biopsies.
IMPRESSION: Ultrasound-guided core biopsies of the left kidney lower pole.

## 2019-09-24 ENCOUNTER — Encounter: Admit: 2019-09-24 | Discharge: 2019-09-25 | Payer: PRIVATE HEALTH INSURANCE

## 2019-09-24 LAB — CBC W/ AUTO DIFF
BASOPHILS ABSOLUTE COUNT: 0 10*9/L (ref 0.0–0.1)
BASOPHILS RELATIVE PERCENT: 0.5 %
EOSINOPHILS ABSOLUTE COUNT: 0.1 10*9/L (ref 0.0–0.7)
EOSINOPHILS RELATIVE PERCENT: 3.6 %
HEMATOCRIT: 38.6 % (ref 38.0–50.0)
HEMOGLOBIN: 12.8 g/dL — ABNORMAL LOW (ref 13.5–17.5)
LYMPHOCYTES ABSOLUTE COUNT: 0.8 10*9/L (ref 0.7–4.0)
LYMPHOCYTES RELATIVE PERCENT: 29 %
MEAN CORPUSCULAR HEMOGLOBIN CONC: 33.2 g/dL (ref 30.0–36.0)
MEAN CORPUSCULAR HEMOGLOBIN: 30.4 pg (ref 26.0–34.0)
MEAN CORPUSCULAR VOLUME: 91.7 fL (ref 81.0–95.0)
MEAN PLATELET VOLUME: 8.2 fL (ref 7.0–10.0)
MONOCYTES ABSOLUTE COUNT: 0.5 10*9/L (ref 0.1–1.0)
MONOCYTES RELATIVE PERCENT: 19.4 %
NEUTROPHILS ABSOLUTE COUNT: 1.3 10*9/L — ABNORMAL LOW (ref 1.7–7.7)
NEUTROPHILS RELATIVE PERCENT: 47.5 %
NUCLEATED RED BLOOD CELLS: 0 /100{WBCs} (ref <=4)
RED BLOOD CELL COUNT: 4.21 10*12/L — ABNORMAL LOW (ref 4.32–5.72)
RED CELL DISTRIBUTION WIDTH: 14.5 % (ref 12.0–15.0)

## 2019-09-24 LAB — BASIC METABOLIC PANEL
ANION GAP: 12 mmol/L (ref 7–15)
BLOOD UREA NITROGEN: 27 mg/dL — ABNORMAL HIGH (ref 7–21)
BUN / CREAT RATIO: 14
CHLORIDE: 107 mmol/L (ref 98–107)
CO2: 23 mmol/L (ref 22.0–30.0)
CREATININE: 1.95 mg/dL — ABNORMAL HIGH (ref 0.70–1.30)
EGFR CKD-EPI AA MALE: 50 mL/min/{1.73_m2} — ABNORMAL LOW (ref >=60)
EGFR CKD-EPI NON-AA MALE: 43 mL/min/{1.73_m2} — ABNORMAL LOW (ref >=60)
GLUCOSE RANDOM: 85 mg/dL (ref 70–179)
POTASSIUM: 3.9 mmol/L (ref 3.5–5.0)

## 2019-09-24 LAB — CO2: Carbon dioxide:SCnc:Pt:Ser/Plas:Qn:: 23

## 2019-09-24 LAB — SMEAR REVIEW

## 2019-09-24 LAB — MAGNESIUM: Magnesium:MCnc:Pt:Ser/Plas:Qn:: 2

## 2019-09-24 LAB — RED BLOOD CELL COUNT: Lab: 4.21 — ABNORMAL LOW

## 2019-09-24 LAB — PHOSPHORUS: Phosphate:MCnc:Pt:Ser/Plas:Qn:: 3.1

## 2019-09-28 ENCOUNTER — Encounter: Admit: 2019-09-28 | Discharge: 2019-09-29 | Payer: PRIVATE HEALTH INSURANCE

## 2019-09-28 LAB — CBC W/ AUTO DIFF
BASOPHILS ABSOLUTE COUNT: 0 10*9/L (ref 0.0–0.1)
BASOPHILS RELATIVE PERCENT: 0.9 %
EOSINOPHILS ABSOLUTE COUNT: 0.2 10*9/L (ref 0.0–0.7)
EOSINOPHILS RELATIVE PERCENT: 7.8 %
HEMATOCRIT: 37.1 % — ABNORMAL LOW (ref 38.0–50.0)
HEMOGLOBIN: 12.3 g/dL — ABNORMAL LOW (ref 13.5–17.5)
LYMPHOCYTES RELATIVE PERCENT: 26.6 %
MEAN CORPUSCULAR HEMOGLOBIN: 30.1 pg (ref 26.0–34.0)
MEAN CORPUSCULAR VOLUME: 90.4 fL (ref 81.0–95.0)
MEAN PLATELET VOLUME: 7.8 fL (ref 7.0–10.0)
MONOCYTES ABSOLUTE COUNT: 0.3 10*9/L (ref 0.1–1.0)
NEUTROPHILS ABSOLUTE COUNT: 1.2 10*9/L — ABNORMAL LOW (ref 1.7–7.7)
NEUTROPHILS RELATIVE PERCENT: 51.1 %
NUCLEATED RED BLOOD CELLS: 0 /100{WBCs} (ref <=4)
RED BLOOD CELL COUNT: 4.1 10*12/L — ABNORMAL LOW (ref 4.32–5.72)
RED CELL DISTRIBUTION WIDTH: 14.1 % (ref 12.0–15.0)
WBC ADJUSTED: 2.3 10*9/L — ABNORMAL LOW (ref 3.5–10.5)

## 2019-09-28 LAB — CREATININE: Creatinine:MCnc:Pt:Ser/Plas:Qn:: 1.65 — ABNORMAL HIGH

## 2019-09-28 LAB — WBC ADJUSTED: Leukocytes:NCnc:Pt:Bld:Qn:: 2.3 — ABNORMAL LOW

## 2019-09-28 LAB — BASIC METABOLIC PANEL
ANION GAP: 11 mmol/L (ref 7–15)
BLOOD UREA NITROGEN: 31 mg/dL — ABNORMAL HIGH (ref 7–21)
BUN / CREAT RATIO: 19
CALCIUM: 9.5 mg/dL (ref 8.5–10.2)
CHLORIDE: 108 mmol/L — ABNORMAL HIGH (ref 98–107)
CO2: 25 mmol/L (ref 22.0–30.0)
EGFR CKD-EPI AA MALE: 61 mL/min/{1.73_m2} (ref >=60)
EGFR CKD-EPI NON-AA MALE: 53 mL/min/{1.73_m2} — ABNORMAL LOW (ref >=60)
GLUCOSE RANDOM: 96 mg/dL (ref 70–179)
POTASSIUM: 3.9 mmol/L (ref 3.5–5.0)
SODIUM: 144 mmol/L (ref 135–145)

## 2019-09-28 LAB — PHOSPHORUS: Phosphate:MCnc:Pt:Ser/Plas:Qn:: 3.8

## 2019-09-28 LAB — MAGNESIUM: Magnesium:MCnc:Pt:Ser/Plas:Qn:: 2

## 2019-10-01 ENCOUNTER — Ambulatory Visit: Admit: 2019-10-01 | Discharge: 2019-10-02 | Payer: PRIVATE HEALTH INSURANCE

## 2019-10-01 LAB — CBC W/ AUTO DIFF
BASOPHILS ABSOLUTE COUNT: 0 10*9/L (ref 0.0–0.1)
BASOPHILS RELATIVE PERCENT: 0.7 %
EOSINOPHILS ABSOLUTE COUNT: 0.1 10*9/L (ref 0.0–0.4)
EOSINOPHILS RELATIVE PERCENT: 3.4 %
HEMATOCRIT: 39.2 % — ABNORMAL LOW (ref 41.0–53.0)
HEMOGLOBIN: 12.5 g/dL — ABNORMAL LOW (ref 13.5–17.5)
LARGE UNSTAINED CELLS: 3 % (ref 0–4)
LYMPHOCYTES ABSOLUTE COUNT: 0.4 10*9/L — ABNORMAL LOW (ref 1.5–5.0)
LYMPHOCYTES RELATIVE PERCENT: 15.9 %
MEAN CORPUSCULAR HEMOGLOBIN: 30 pg (ref 26.0–34.0)
MEAN CORPUSCULAR VOLUME: 94.6 fL (ref 80.0–100.0)
MEAN PLATELET VOLUME: 8.5 fL (ref 7.0–10.0)
MONOCYTES ABSOLUTE COUNT: 0.2 10*9/L (ref 0.2–0.8)
MONOCYTES RELATIVE PERCENT: 10.3 %
NEUTROPHILS ABSOLUTE COUNT: 1.6 10*9/L — ABNORMAL LOW (ref 2.0–7.5)
NEUTROPHILS RELATIVE PERCENT: 67 %
PLATELET COUNT: 208 10*9/L (ref 150–440)
RED BLOOD CELL COUNT: 4.15 10*12/L — ABNORMAL LOW (ref 4.50–5.90)
WBC ADJUSTED: 2.4 10*9/L — ABNORMAL LOW (ref 4.5–11.0)

## 2019-10-01 LAB — SMEAR REVIEW

## 2019-10-01 LAB — BASIC METABOLIC PANEL
ANION GAP: 9 mmol/L (ref 7–15)
BLOOD UREA NITROGEN: 23 mg/dL — ABNORMAL HIGH (ref 7–21)
CALCIUM: 9.2 mg/dL (ref 8.5–10.2)
CHLORIDE: 105 mmol/L (ref 98–107)
CO2: 25 mmol/L (ref 22.0–30.0)
EGFR CKD-EPI AA MALE: 61 mL/min/{1.73_m2} (ref >=60)
EGFR CKD-EPI NON-AA MALE: 53 mL/min/{1.73_m2} — ABNORMAL LOW (ref >=60)
GLUCOSE RANDOM: 89 mg/dL (ref 70–179)
POTASSIUM: 4 mmol/L (ref 3.5–5.0)
SODIUM: 139 mmol/L (ref 135–145)

## 2019-10-01 LAB — MAGNESIUM: Magnesium:MCnc:Pt:Ser/Plas:Qn:: 2

## 2019-10-01 LAB — PHOSPHORUS: Phosphate:MCnc:Pt:Ser/Plas:Qn:: 3.3

## 2019-10-01 LAB — CO2: Carbon dioxide:SCnc:Pt:Ser/Plas:Qn:: 25

## 2019-10-01 LAB — MEAN CORPUSCULAR HEMOGLOBIN CONC: Erythrocyte mean corpuscular hemoglobin concentration:MCnc:Pt:RBC:Qn:Automated count: 31.7

## 2019-10-01 NOTE — Unmapped (Signed)
1025 Patient arrived Transplant Infusion Room today for Belatacept, Condition: well; Mobility: ambulating; accompanied by self.   See Flowsheet and MAR for all details of visit.   1032VS stable.  1043  PIV placed and secured with coban, NS KVO; labs collected and sent, urine collected and sent and no orders found.    1044 Infusion initiated.  1116Infusion complete.  1122VS stable, PIV removed, pt left clinic, Condition: well; Mobility: ambulating; accompanied by self.

## 2019-10-02 DIAGNOSIS — Z940 Kidney transplant status: Principal | ICD-10-CM

## 2019-10-02 MED ORDER — MYCOPHENOLATE SODIUM 180 MG TABLET,DELAYED RELEASE
ORAL_TABLET | Freq: Two times a day (BID) | ORAL | 11 refills | 30 days | Status: CP
Start: 2019-10-02 — End: 2020-10-01
  Filled 2019-10-13: qty 120, 30d supply, fill #0

## 2019-10-02 NOTE — Unmapped (Signed)
Change in Mycophenolate dosage decrease. Co-pay $80.00.  Per test claim for Mycophedolate at the [facility], patient needs Medication Assistance Program for High Copay.

## 2019-10-02 NOTE — Unmapped (Signed)
Received message from Dr. Toni Arthurs to increase Myfortic to 360mg  BID. Pt made aware, verbalized understanding. Updated prescription sent to [facility]. If WBC stays stable on Monday, will decrease lab draws to 1 x per week.

## 2019-10-05 ENCOUNTER — Encounter: Admit: 2019-10-05 | Discharge: 2019-10-06 | Payer: PRIVATE HEALTH INSURANCE

## 2019-10-05 LAB — CBC W/ AUTO DIFF
BASOPHILS ABSOLUTE COUNT: 0 10*9/L (ref 0.0–0.1)
BASOPHILS RELATIVE PERCENT: 0.3 %
EOSINOPHILS ABSOLUTE COUNT: 0.1 10*9/L (ref 0.0–0.7)
EOSINOPHILS RELATIVE PERCENT: 3.8 %
HEMATOCRIT: 39.2 % (ref 38.0–50.0)
HEMOGLOBIN: 12.9 g/dL — ABNORMAL LOW (ref 13.5–17.5)
LYMPHOCYTES ABSOLUTE COUNT: 0.8 10*9/L (ref 0.7–4.0)
LYMPHOCYTES RELATIVE PERCENT: 30 %
MEAN CORPUSCULAR HEMOGLOBIN: 29.7 pg (ref 26.0–34.0)
MEAN CORPUSCULAR VOLUME: 90.6 fL (ref 81.0–95.0)
MEAN PLATELET VOLUME: 7.4 fL (ref 7.0–10.0)
MONOCYTES ABSOLUTE COUNT: 0.4 10*9/L (ref 0.1–1.0)
NEUTROPHILS ABSOLUTE COUNT: 1.3 10*9/L — ABNORMAL LOW (ref 1.7–7.7)
NEUTROPHILS RELATIVE PERCENT: 50.4 %
NUCLEATED RED BLOOD CELLS: 0 /100{WBCs} (ref <=4)
PLATELET COUNT: 202 10*9/L (ref 150–450)
RED BLOOD CELL COUNT: 4.33 10*12/L (ref 4.32–5.72)
RED CELL DISTRIBUTION WIDTH: 14.3 % (ref 12.0–15.0)
WBC ADJUSTED: 2.5 10*9/L — ABNORMAL LOW (ref 3.5–10.5)

## 2019-10-05 LAB — BASIC METABOLIC PANEL
ANION GAP: 10 mmol/L (ref 7–15)
BLOOD UREA NITROGEN: 21 mg/dL (ref 7–21)
BUN / CREAT RATIO: 13
CALCIUM: 9.7 mg/dL (ref 8.5–10.2)
CHLORIDE: 106 mmol/L (ref 98–107)
CO2: 28 mmol/L (ref 22.0–30.0)
CREATININE: 1.65 mg/dL — ABNORMAL HIGH (ref 0.70–1.30)
EGFR CKD-EPI AA MALE: 61 mL/min/{1.73_m2} (ref >=60)
EGFR CKD-EPI NON-AA MALE: 53 mL/min/{1.73_m2} — ABNORMAL LOW (ref >=60)
GLUCOSE RANDOM: 90 mg/dL (ref 70–179)
POTASSIUM: 4.2 mmol/L (ref 3.5–5.0)

## 2019-10-05 LAB — RED CELL DISTRIBUTION WIDTH: Lab: 14.3

## 2019-10-05 LAB — SLIDE REVIEW

## 2019-10-05 LAB — PHOSPHORUS: Phosphate:MCnc:Pt:Ser/Plas:Qn:: 2.9

## 2019-10-05 LAB — HYPERSEGMENTED NEUTROPHILS

## 2019-10-05 LAB — MAGNESIUM: Magnesium:MCnc:Pt:Ser/Plas:Qn:: 2.1

## 2019-10-05 LAB — SODIUM: Sodium:SCnc:Pt:Ser/Plas:Qn:: 144

## 2019-10-09 NOTE — Unmapped (Signed)
[facility] [place] transplant nephrology clinic visit    assessment and plan  1. s/p kidney transplant 04/28/2019. baseline creatinine 1.5-1.8 mg/dl. no proteinuria. no donor specific hla ab detected.   2. immunosuppression. belatacept 5mg /kg q.month. prednisone 5mg  daily. mycophenolate 180mg  bid restarted; potential incr to 360mg  bid in 2wks as tolerated.  3. hypertension. blood pressure goal <130/80 mmhg.  4. preventive medicine. trimethoprim/sulfamethoxazole 80/400mg  3x/wk x88m 11/20-5/21. influenza '20. pcv13 pnuemococcal '19. ppsv23 pneumococcal '19. covid-19 vaccine recommended.     history of present illness    [name] is a 34 year old gentleman seen in follow up post kidney transplant 04/28/2019. he transitioned from tacrolimus to belatacept maintenance in past month d/t tremor, headache and decr focus ?calcineurin inh neurotoxicity. he feels better. tremor sign improved. no sign change in focus or headache. no fever chills or sweats. no chest pain cough or shortness of breath. no lower ext edema. no abdominal pain no n/v/d. no dysuria hematuria or difficulty voiding. all other systems reviewed and negative x10 systems.    past medical hx:  1. 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 baseline creatinine 1.5-1.8 mg/dl.   > kidney bx 01/21: no acute/chr rejection.  2. hypertension    allergies: nkda    medications: belatacept 5mg /kg q.month, mycophenolate 180mg  bid, prednisone 5mg  daily, aspirin 81mg  daily, amlodipine 10mg  daily, hydralazine 100mg  bid, clonidine 0.1mg  daily, escitalopram 15mg  daily, buspirone 15mg  bid, alprazolam 0.5mg  bid, gabapentin 200mg  tid, mg oxide 133mg  daily, omeprazole 20mg  bid, trimethoprim/sulfamethoxazole 80/400mg  3x/wk, valganciclovir 450mg  daily.    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    physical exam: t97 p78 bp121/85 wt72.4kg bmi 21.6. wd/wn gentleman appropriate affect and mood. sclera anicteric. wearing mask. neck supple no palpable ln. heart rrr nl s1s2 no m/r/g. lungs clear bilateral. abd soft nt/nd. no lower ext edema. msk no synovitis/tophi. skin no rash. neuro alert oriented non focal exam.    labs 09/14/19: wbc3.0 hgb12 hct35.8 plts203. na143 k4.6 cl105 bicarb24 bun28 cr1.7 glc81 ca9.8 mg1.6 phos3.4.

## 2019-10-12 NOTE — Unmapped (Signed)
5/10: speaking with patient today re: change to brand myfortic -Ryan Moon    [facility] Pharmacist Intervention    Type of intervention: change from generic to brand of txp med    Medication: myfortic/mycophenolate    Problem: due to high copay on generic, copay card was obtained for brand. Cost is $0. Patient and clinic ok with change    Intervention: see above    Follow up needed: na. Per clinic, no labwork needed    Approximate time spent: 10 minutes    [name]   [facility] Specialty Pharmacist      [facility] Clinical Assessment & Refill Coordination Note    Ryan Moon, DOB: 06/18/1985  Phone: [phone] (home)     All above HIPAA information was verified with patient.     Was a Nurse, learning disability used for this call? No    Specialty Medication(s):   Transplant:  mycophenolic acid 180mg , Myfortic 180mg  and Prednisone 5mg      Current Outpatient Medications   Medication Sig Dispense Refill   ??? acetaminophen (TYLENOL EXTRA STRENGTH) 500 MG tablet Take 2 tablets (1,000 mg total) by mouth every six (6) hours as needed for pain or fever (> 38C). 100 tablet 0   ??? ALPRAZolam (XANAX) 0.5 MG tablet Take 0.5 mg by mouth two (2) times a day as needed.     ??? amLODIPine (NORVASC) 10 MG tablet Take 1 tablet (10 mg total) by mouth daily. 90 tablet 3   ??? aspirin (ECOTRIN) 81 MG tablet Take 1 tablet (81 mg total) by mouth daily. 30 tablet 11   ??? belatacept (NULOJIX) 250 mg SolR Belatacept  5 mg/kg every 2 weeks for 5 doses then once every 4 weeks 14.5 mL 0   ??? busPIRone (BUSPAR) 15 MG tablet Take 15 mg by mouth two (2) times a day.     ??? docusate sodium (COLACE) 100 MG capsule Take 1 capsule (100 mg total) by mouth Two (2) times a day. (Patient not taking: Reported on 09/18/2019) 180 capsule 3   ??? escitalopram oxalate (LEXAPRO) 10 MG tablet Take 10 mg by mouth every morning.      ??? gabapentin (NEURONTIN) 100 MG capsule Take 200 mg 3 times daily for 3 days then 100 mg 3 times daily for 3 days then stop (Patient not taking: Reported on 09/18/2019) 270 capsule 1   ??? hydrALAZINE (APRESOLINE) 100 MG tablet Take 1 tablet (100 mg total) by mouth Two (2) times a day. 60 tablet 11   ??? magnesium oxide-Mg AA chelate (MAGNESIUM, AMINO ACID CHELATE,) 133 mg Tab Take 1 tablet by mouth daily. 100 tablet 11   ??? mycophenolate (MYFORTIC) 180 MG EC tablet Take 2 tablets (360 mg total) by mouth Two (2) times a day. 120 tablet 11   ??? omeprazole (PRILOSEC) 20 MG capsule Take 1 capsule (20 mg total) by mouth daily. 30 capsule 5   ??? predniSONE (DELTASONE) 5 MG tablet Take 1 tablet (5 mg total) by mouth daily. 30 tablet 11   ??? sulfamethoxazole-trimethoprim (BACTRIM) 400-80 mg per tablet Take 1 tablet (80 mg of trimethoprim total) by mouth 3 (three) times a week. 12 tablet 3     No current facility-administered medications for this visit.        Changes to medications: Ryan Moon reports no changes at this time.    Allergies   Allergen Reactions   ??? Acetaminophen Nausea And Vomiting  Changes to allergies: No    SPECIALTY MEDICATION ADHERENCE     Mycophenolate 180mg   : 5 days of medicine on hand   Myfortic 180mg   : 0 days of medicine on hand will switch from generic on this fill  Prednisone 5mg   : 5 days of medicine on hand     Medication Adherence    Patient reported X missed doses in the last month: 0  Specialty Medication: mycophenolate 180mg   Patient is on additional specialty medications: Yes  Additional Specialty Medications: Myfortic 180mg   Patient Reported Additional Medication X Missed Doses in the Last Month: 0  Patient is on more than two specialty medications: Yes  Specialty Medication: prednisone 5mg   Patient Reported Additional Medication X Missed Doses in the Last Month: 0          Specialty medication(s) dose(s) confirmed: Regimen is correct and unchanged.     Are there any concerns with adherence? No    Adherence counseling provided? Not needed    CLINICAL MANAGEMENT AND INTERVENTION      Clinical Benefit Assessment:    Do you feel the medicine is effective or helping your condition? Yes    Clinical Benefit counseling provided? Not needed    Adverse Effects Assessment:    Are you experiencing any side effects? No    Are you experiencing difficulty administering your medicine? No    Quality of Life Assessment:    How many days over the past month did your transplant  keep you from your normal activities? For example, brushing your teeth or getting up in the morning. 0    Have you discussed this with your provider? Not needed    Therapy Appropriateness:    Is therapy appropriate? Yes, therapy is appropriate and should be continued    DISEASE/MEDICATION-SPECIFIC INFORMATION      N/A    PATIENT SPECIFIC NEEDS     - Does the patient have any physical, cognitive, or cultural barriers? No    - Is the patient high risk? Yes, patient is taking a REMS drug. Medication is dispensed in compliance with REMS program.     - Does the patient require a Care Management Plan? No     - Does the patient require physician intervention or other additional services (i.e. nutrition, smoking cessation, social work)? No      SHIPPING     Specialty Medication(s) to be Shipped:   Transplant: Myfortic 180mg  and Prednisone 5mg     Other medication(s) to be shipped: asa, bactrim     Changes to insurance: No    Delivery Scheduled: Yes, Expected medication delivery date: 10/14/2019.     Medication will be delivered via UPS to the confirmed prescription address in [facility].    The patient will receive a drug information handout for each medication shipped and additional FDA Medication Guides as required.  Verified that patient has previously received a Conservation officer, historic buildings.    All of the patient's questions and concerns have been addressed.    [name]   [facility] Specialty Pharmacist

## 2019-10-12 NOTE — Unmapped (Signed)
[facility] Specialty Medication Onboarding    Specialty Medication: MYFORTIC (BRAND) 180MG  TABLETS  Prior Authorization: Approved   Financial Assistance: Yes - copay card approved as secondary   Final Copay/Day Supply: $0 / 30 DAYS    Insurance Restrictions: Yes - max 1 month supply     Notes to Pharmacist: CHANGE FROM GENERIC TO BRAND    The triage team has completed the benefits investigation and has determined that the patient is able to fill this medication at [facility]. Please contact the patient to complete the onboarding or follow up with the prescribing physician as needed.

## 2019-10-13 ENCOUNTER — Encounter: Admit: 2019-10-13 | Discharge: 2019-10-14 | Payer: PRIVATE HEALTH INSURANCE

## 2019-10-13 LAB — CBC W/ AUTO DIFF
BASOPHILS ABSOLUTE COUNT: 0 10*9/L (ref 0.0–0.1)
BASOPHILS RELATIVE PERCENT: 0.4 %
EOSINOPHILS RELATIVE PERCENT: 2.7 %
HEMATOCRIT: 36.4 % — ABNORMAL LOW (ref 38.0–50.0)
HEMOGLOBIN: 12.4 g/dL — ABNORMAL LOW (ref 13.5–17.5)
LYMPHOCYTES ABSOLUTE COUNT: 0.7 10*9/L (ref 0.7–4.0)
LYMPHOCYTES RELATIVE PERCENT: 34.7 %
MEAN CORPUSCULAR HEMOGLOBIN CONC: 34 g/dL (ref 30.0–36.0)
MEAN CORPUSCULAR VOLUME: 89.5 fL (ref 81.0–95.0)
MEAN PLATELET VOLUME: 7 fL (ref 7.0–10.0)
MONOCYTES ABSOLUTE COUNT: 0.3 10*9/L (ref 0.1–1.0)
NEUTROPHILS ABSOLUTE COUNT: 1.1 10*9/L — ABNORMAL LOW (ref 1.7–7.7)
NEUTROPHILS RELATIVE PERCENT: 50.3 %
NUCLEATED RED BLOOD CELLS: 0 /100{WBCs} (ref <=4)
PLATELET COUNT: 182 10*9/L (ref 150–450)
RED BLOOD CELL COUNT: 4.07 10*12/L — ABNORMAL LOW (ref 4.32–5.72)
RED CELL DISTRIBUTION WIDTH: 14.3 % (ref 12.0–15.0)
WBC ADJUSTED: 2.2 10*9/L — ABNORMAL LOW (ref 3.5–10.5)

## 2019-10-13 LAB — BASIC METABOLIC PANEL
ANION GAP: 9 mmol/L (ref 3–11)
BLOOD UREA NITROGEN: 24 mg/dL — ABNORMAL HIGH (ref 9–23)
CHLORIDE: 106 mmol/L (ref 98–107)
CO2: 25.2 mmol/L (ref 20.0–31.0)
CREATININE: 1.51 mg/dL — ABNORMAL HIGH (ref 0.60–1.10)
EGFR CKD-EPI AA MALE: 68 mL/min/{1.73_m2}
EGFR CKD-EPI NON-AA MALE: 59 mL/min/{1.73_m2}
GLUCOSE RANDOM: 82 mg/dL (ref 70–179)
POTASSIUM: 3.6 mmol/L (ref 3.5–5.1)
SODIUM: 140 mmol/L (ref 135–145)

## 2019-10-13 LAB — RED BLOOD CELL COUNT: Lab: 4.07 — ABNORMAL LOW

## 2019-10-13 LAB — MAGNESIUM: Magnesium:MCnc:Pt:Ser/Plas:Qn:: 2

## 2019-10-13 LAB — PHOSPHORUS: Phosphate:MCnc:Pt:Ser/Plas:Qn:: 2.9

## 2019-10-13 LAB — SMEAR REVIEW

## 2019-10-13 LAB — BLOOD UREA NITROGEN: Urea nitrogen:MCnc:Pt:Ser/Plas:Qn:: 24 — ABNORMAL HIGH

## 2019-10-13 MED FILL — PREDNISONE 5 MG TABLET: 30 days supply | Qty: 30 | Fill #4 | Status: AC

## 2019-10-13 MED FILL — ASPIRIN 81 MG TABLET,DELAYED RELEASE: 30 days supply | Qty: 30 | Fill #5 | Status: AC

## 2019-10-13 MED FILL — PREDNISONE 5 MG TABLET: ORAL | 30 days supply | Qty: 30 | Fill #4

## 2019-10-13 MED FILL — MYFORTIC 180 MG TABLET,DELAYED RELEASE: 30 days supply | Qty: 120 | Fill #0 | Status: AC

## 2019-10-13 MED FILL — ASPIRIN 81 MG TABLET,DELAYED RELEASE: ORAL | 30 days supply | Qty: 30 | Fill #5

## 2019-10-13 MED FILL — SULFAMETHOXAZOLE 400 MG-TRIMETHOPRIM 80 MG TABLET: 28 days supply | Qty: 12 | Fill #2 | Status: AC

## 2019-10-13 MED FILL — SULFAMETHOXAZOLE 400 MG-TRIMETHOPRIM 80 MG TABLET: ORAL | 28 days supply | Qty: 12 | Fill #2

## 2019-10-15 ENCOUNTER — Encounter: Admit: 2019-10-15 | Discharge: 2019-10-16 | Payer: PRIVATE HEALTH INSURANCE

## 2019-10-15 NOTE — Unmapped (Signed)
2956 Patient arrived Transplant Infusion Room today for belatacept 362.5mg , Condition: well; Mobility: ambulating; accompanied by self.   See Flowsheet and MAR for all details of visit.   2130 VS stable.  1004  PIV placed and secured with coban, NS KVO; labs no orders found, labs recently drawn; urine no orders found.  1006 Infusion initiated.  1037 Infusion complete.   1045 VS stable, Line flushed with NS, PIV removed and secured with coban, pt left clinic, Condition: well; Mobility: ambulating; accompanied by self.

## 2019-10-20 ENCOUNTER — Encounter: Admit: 2019-10-20 | Discharge: 2019-10-21 | Payer: PRIVATE HEALTH INSURANCE

## 2019-10-20 LAB — BASIC METABOLIC PANEL
ANION GAP: 6 mmol/L (ref 3–11)
BLOOD UREA NITROGEN: 20 mg/dL (ref 9–23)
BUN / CREAT RATIO: 12
CHLORIDE: 106 mmol/L (ref 98–107)
CO2: 27.2 mmol/L (ref 20.0–31.0)
CREATININE: 1.73 mg/dL — ABNORMAL HIGH (ref 0.60–1.10)
EGFR CKD-EPI AA MALE: 58 mL/min/{1.73_m2}
EGFR CKD-EPI NON-AA MALE: 50 mL/min/{1.73_m2}
GLUCOSE RANDOM: 96 mg/dL (ref 70–179)
POTASSIUM: 3.9 mmol/L (ref 3.5–5.1)
SODIUM: 139 mmol/L (ref 135–145)

## 2019-10-20 LAB — CBC W/ AUTO DIFF
BASOPHILS ABSOLUTE COUNT: 0 10*9/L (ref 0.0–0.1)
BASOPHILS RELATIVE PERCENT: 0.5 %
EOSINOPHILS ABSOLUTE COUNT: 0.1 10*9/L (ref 0.0–0.7)
HEMATOCRIT: 37.7 % — ABNORMAL LOW (ref 38.0–50.0)
HEMOGLOBIN: 12.7 g/dL — ABNORMAL LOW (ref 13.5–17.5)
LYMPHOCYTES ABSOLUTE COUNT: 0.8 10*9/L (ref 0.7–4.0)
LYMPHOCYTES RELATIVE PERCENT: 37.7 %
MEAN CORPUSCULAR HEMOGLOBIN CONC: 33.7 g/dL (ref 30.0–36.0)
MEAN CORPUSCULAR HEMOGLOBIN: 29.9 pg (ref 26.0–34.0)
MEAN CORPUSCULAR VOLUME: 88.9 fL (ref 81.0–95.0)
MEAN PLATELET VOLUME: 7 fL (ref 7.0–10.0)
MONOCYTES ABSOLUTE COUNT: 0.3 10*9/L (ref 0.1–1.0)
MONOCYTES RELATIVE PERCENT: 13.4 %
NEUTROPHILS ABSOLUTE COUNT: 1 10*9/L — ABNORMAL LOW (ref 1.7–7.7)
NUCLEATED RED BLOOD CELLS: 0 /100{WBCs} (ref <=4)
PLATELET COUNT: 206 10*9/L (ref 150–450)
RED CELL DISTRIBUTION WIDTH: 14.3 % (ref 12.0–15.0)
WBC ADJUSTED: 2.2 10*9/L — ABNORMAL LOW (ref 3.5–10.5)

## 2019-10-20 LAB — PHOSPHORUS: Phosphate:MCnc:Pt:Ser/Plas:Qn:: 2.8

## 2019-10-20 LAB — MAGNESIUM: Magnesium:MCnc:Pt:Ser/Plas:Qn:: 2

## 2019-10-20 LAB — CREATININE: Creatinine:MCnc:Pt:Ser/Plas:Qn:: 1.73 — ABNORMAL HIGH

## 2019-10-20 LAB — LYMPHOCYTES ABSOLUTE COUNT: Lymphocytes:NCnc:Pt:Bld:Qn:Automated count: 0.8

## 2019-10-29 ENCOUNTER — Ambulatory Visit: Admit: 2019-10-29 | Discharge: 2019-10-30 | Payer: PRIVATE HEALTH INSURANCE

## 2019-10-29 LAB — PHOSPHORUS: Phosphate:MCnc:Pt:Ser/Plas:Qn:: 2.2 — ABNORMAL LOW

## 2019-10-29 LAB — CBC W/ AUTO DIFF
BASOPHILS ABSOLUTE COUNT: 0 10*9/L (ref 0.0–0.1)
BASOPHILS RELATIVE PERCENT: 0.3 %
EOSINOPHILS ABSOLUTE COUNT: 0.1 10*9/L (ref 0.0–0.7)
EOSINOPHILS RELATIVE PERCENT: 2.3 %
HEMATOCRIT: 36.1 % — ABNORMAL LOW (ref 38.0–50.0)
HEMOGLOBIN: 12.1 g/dL — ABNORMAL LOW (ref 13.5–17.5)
LYMPHOCYTES ABSOLUTE COUNT: 0.8 10*9/L (ref 0.7–4.0)
MEAN CORPUSCULAR HEMOGLOBIN CONC: 33.6 g/dL (ref 30.0–36.0)
MEAN CORPUSCULAR HEMOGLOBIN: 29.6 pg (ref 26.0–34.0)
MEAN PLATELET VOLUME: 7.3 fL (ref 7.0–10.0)
MONOCYTES ABSOLUTE COUNT: 0.3 10*9/L (ref 0.1–1.0)
MONOCYTES RELATIVE PERCENT: 14 %
NEUTROPHILS RELATIVE PERCENT: 49.5 %
NUCLEATED RED BLOOD CELLS: 0 /100{WBCs} (ref <=4)
PLATELET COUNT: 211 10*9/L (ref 150–450)
RED BLOOD CELL COUNT: 4.1 10*12/L — ABNORMAL LOW (ref 4.32–5.72)
RED CELL DISTRIBUTION WIDTH: 14.7 % (ref 12.0–15.0)
WBC ADJUSTED: 2.5 10*9/L — ABNORMAL LOW (ref 3.5–10.5)

## 2019-10-29 LAB — BASIC METABOLIC PANEL
BLOOD UREA NITROGEN: 21 mg/dL (ref 9–23)
BUN / CREAT RATIO: 12
CALCIUM: 9.8 mg/dL (ref 8.7–10.4)
CHLORIDE: 105 mmol/L (ref 98–107)
CO2: 27.2 mmol/L (ref 20.0–31.0)
CREATININE: 1.69 mg/dL — ABNORMAL HIGH (ref 0.60–1.10)
EGFR CKD-EPI AA MALE: 60 mL/min/{1.73_m2}
POTASSIUM: 3.7 mmol/L (ref 3.5–5.1)
SODIUM: 138 mmol/L (ref 135–145)

## 2019-10-29 LAB — NEUTROPHILS RELATIVE PERCENT: Neutrophils/100 leukocytes:NFr:Pt:Bld:Qn:Automated count: 49.5

## 2019-10-29 LAB — MAGNESIUM: Magnesium:MCnc:Pt:Ser/Plas:Qn:: 1.9

## 2019-10-29 LAB — CO2: Carbon dioxide:SCnc:Pt:Ser/Plas:Qn:: 27.2

## 2019-10-29 LAB — SMEAR REVIEW

## 2019-11-04 ENCOUNTER — Encounter: Admit: 2019-11-04 | Discharge: 2019-11-05 | Payer: PRIVATE HEALTH INSURANCE

## 2019-11-04 LAB — CBC W/ AUTO DIFF
BASOPHILS ABSOLUTE COUNT: 0 10*9/L (ref 0.0–0.1)
EOSINOPHILS ABSOLUTE COUNT: 0 10*9/L (ref 0.0–0.7)
EOSINOPHILS RELATIVE PERCENT: 0.9 %
HEMATOCRIT: 38.5 % (ref 38.0–50.0)
HEMOGLOBIN: 13 g/dL — ABNORMAL LOW (ref 13.5–17.5)
LYMPHOCYTES ABSOLUTE COUNT: 0.7 10*9/L (ref 0.7–4.0)
LYMPHOCYTES RELATIVE PERCENT: 23.4 %
MEAN CORPUSCULAR HEMOGLOBIN CONC: 33.8 g/dL (ref 30.0–36.0)
MEAN CORPUSCULAR HEMOGLOBIN: 29.8 pg (ref 26.0–34.0)
MEAN CORPUSCULAR VOLUME: 88.3 fL (ref 81.0–95.0)
MEAN PLATELET VOLUME: 7.1 fL (ref 7.0–10.0)
MONOCYTES ABSOLUTE COUNT: 0.3 10*9/L (ref 0.1–1.0)
MONOCYTES RELATIVE PERCENT: 11.1 %
NEUTROPHILS ABSOLUTE COUNT: 2 10*9/L (ref 1.7–7.7)
NEUTROPHILS RELATIVE PERCENT: 64.1 %
PLATELET COUNT: 240 10*9/L (ref 150–450)
RED BLOOD CELL COUNT: 4.36 10*12/L (ref 4.32–5.72)
RED CELL DISTRIBUTION WIDTH: 14.8 % (ref 12.0–15.0)
WBC ADJUSTED: 3.1 10*9/L — ABNORMAL LOW (ref 3.5–10.5)

## 2019-11-04 LAB — BASIC METABOLIC PANEL
ANION GAP: 6 mmol/L (ref 3–11)
BLOOD UREA NITROGEN: 19 mg/dL (ref 9–23)
BUN / CREAT RATIO: 13
CALCIUM: 9.9 mg/dL (ref 8.7–10.4)
CO2: 27.2 mmol/L (ref 20.0–31.0)
EGFR CKD-EPI AA MALE: 69 mL/min/{1.73_m2}
EGFR CKD-EPI NON-AA MALE: 60 mL/min/{1.73_m2}
POTASSIUM: 3.9 mmol/L (ref 3.5–5.1)
SODIUM: 139 mmol/L (ref 135–145)

## 2019-11-04 LAB — SLIDE REVIEW

## 2019-11-04 LAB — MAGNESIUM: Magnesium:MCnc:Pt:Ser/Plas:Qn:: 2

## 2019-11-04 LAB — SMEAR REVIEW

## 2019-11-04 LAB — BUN / CREAT RATIO: Urea nitrogen/Creatinine:MRto:Pt:Ser/Plas:Qn:: 13

## 2019-11-04 LAB — MONOCYTES ABSOLUTE COUNT: Monocytes:NCnc:Pt:Bld:Qn:Automated count: 0.3

## 2019-11-04 LAB — PHOSPHORUS
PHOSPHORUS: 2.4 mg/dL (ref 2.4–5.1)
Phosphate:MCnc:Pt:Ser/Plas:Qn:: 2.4

## 2019-11-04 NOTE — Unmapped (Signed)
[facility] Refill Coordination Note    Specialty Medication(s) to be Shipped:   Transplant: Myfortic 180mg  and Prednisone 5mg     Other medication(s) to be shipped: Sulfamethoxzole-Trimethoprim 400-80mg ,Aspirin 81mg      [name], DOB: December 30, 1985  Phone: [phone] (home)       All above HIPAA information was verified with patient.     Was a Nurse, learning disability used for this call? No    Completed refill call assessment today to schedule patient's medication shipment from the [facility] [phone]).       Specialty medication(s) and dose(s) confirmed: Regimen is correct and unchanged.   Changes to medications: Wyat reports no changes at this time.  Changes to insurance: No  Questions for the pharmacist: No    Confirmed patient received Welcome Packet with first shipment. The patient will receive a drug information handout for each medication shipped and additional FDA Medication Guides as required.       DISEASE/MEDICATION-SPECIFIC INFORMATION        N/A    SPECIALTY MEDICATION ADHERENCE     Medication Adherence    Patient reported X missed doses in the last month: 0  Specialty Medication: myfortic 180mg   Patient is on additional specialty medications: Yes  Additional Specialty Medications: Prednisone 5mg   Patient Reported Additional Medication X Missed Doses in the Last Month: 0  Patient is on more than two specialty medications: No  Informant: patient  Reliability of informant: reliable  Patient is at risk for Non-Adherence: No                Myfortic 180 mg: 9 days of medicine on hand   Prednisone 5 mg: 9 days of medicine on hand         SHIPPING     Shipping address confirmed in Epic.     Delivery Scheduled: Yes, Expected medication delivery date: 06/08.     Medication will be delivered via Next Day Courier to the prescription address in Epic WAM.    Antonietta Barcelona   [facility] Specialty Technician

## 2019-11-09 MED FILL — ASPIRIN 81 MG TABLET,DELAYED RELEASE: 30 days supply | Qty: 30 | Fill #6 | Status: AC

## 2019-11-09 MED FILL — SULFAMETHOXAZOLE 400 MG-TRIMETHOPRIM 80 MG TABLET: ORAL | 28 days supply | Qty: 12 | Fill #3

## 2019-11-09 MED FILL — SULFAMETHOXAZOLE 400 MG-TRIMETHOPRIM 80 MG TABLET: 28 days supply | Qty: 12 | Fill #3 | Status: AC

## 2019-11-09 MED FILL — MYFORTIC 180 MG TABLET,DELAYED RELEASE: 30 days supply | Qty: 120 | Fill #1 | Status: AC

## 2019-11-09 MED FILL — ASPIRIN 81 MG TABLET,DELAYED RELEASE: ORAL | 30 days supply | Qty: 30 | Fill #6

## 2019-11-09 MED FILL — MYFORTIC 180 MG TABLET,DELAYED RELEASE: ORAL | 30 days supply | Qty: 120 | Fill #1

## 2019-11-09 NOTE — Unmapped (Signed)
Baker Feliz Beam 's prednisone  shipment will be delayed as a result of the medication is too soon to refill until 11/12/19.     I have reached out to the patient and left a voicemail message.  We will wait for a call back from the patient to reschedule the delivery.  We have not confirmed the new delivery date.

## 2019-11-10 NOTE — Unmapped (Signed)
Ryan Moon 's prednisone shipment will be delayed as a result of the medication is too soon to refill until 11/12/19.     I have reached out to the patient and communicated the delivery change. We will reschedule the medication for the delivery date that the patient agreed upon.  We have confirmed the delivery date as 11/13/19, via ups.

## 2019-11-12 ENCOUNTER — Encounter: Admit: 2019-11-12 | Discharge: 2019-11-13 | Payer: PRIVATE HEALTH INSURANCE

## 2019-11-12 LAB — CBC W/ AUTO DIFF
BASOPHILS ABSOLUTE COUNT: 0 10*9/L (ref 0.0–0.1)
BASOPHILS RELATIVE PERCENT: 0.4 %
EOSINOPHILS ABSOLUTE COUNT: 0 10*9/L (ref 0.0–0.4)
EOSINOPHILS RELATIVE PERCENT: 1.5 %
HEMATOCRIT: 35.4 % — ABNORMAL LOW (ref 41.0–53.0)
LARGE UNSTAINED CELLS: 4 % (ref 0–4)
LYMPHOCYTES ABSOLUTE COUNT: 0.5 10*9/L — ABNORMAL LOW (ref 1.5–5.0)
LYMPHOCYTES RELATIVE PERCENT: 23.1 %
MEAN CORPUSCULAR HEMOGLOBIN CONC: 32.4 g/dL (ref 31.0–37.0)
MEAN CORPUSCULAR HEMOGLOBIN: 29 pg (ref 26.0–34.0)
MEAN CORPUSCULAR VOLUME: 89.7 fL (ref 80.0–100.0)
MEAN PLATELET VOLUME: 8.1 fL (ref 7.0–10.0)
MONOCYTES ABSOLUTE COUNT: 0.3 10*9/L (ref 0.2–0.8)
MONOCYTES RELATIVE PERCENT: 13 %
NEUTROPHILS ABSOLUTE COUNT: 1.3 10*9/L — ABNORMAL LOW (ref 2.0–7.5)
NEUTROPHILS RELATIVE PERCENT: 58.2 %
RED BLOOD CELL COUNT: 3.94 10*12/L — ABNORMAL LOW (ref 4.50–5.90)
WBC ADJUSTED: 2.2 10*9/L — ABNORMAL LOW (ref 4.5–11.0)

## 2019-11-12 LAB — BASIC METABOLIC PANEL
ANION GAP: 7 mmol/L (ref 7–15)
BUN / CREAT RATIO: 19
CALCIUM: 9.6 mg/dL (ref 8.5–10.2)
CHLORIDE: 105 mmol/L (ref 98–107)
CO2: 25 mmol/L (ref 22.0–30.0)
CREATININE: 1.44 mg/dL — ABNORMAL HIGH (ref 0.70–1.30)
EGFR CKD-EPI AA MALE: 72 mL/min/{1.73_m2} (ref >=60)
GLUCOSE RANDOM: 103 mg/dL (ref 70–179)
POTASSIUM: 4.2 mmol/L (ref 3.5–5.0)
SODIUM: 137 mmol/L (ref 135–145)

## 2019-11-12 LAB — MEAN CORPUSCULAR HEMOGLOBIN: Erythrocyte mean corpuscular hemoglobin:EntMass:Pt:RBC:Qn:Automated count: 29

## 2019-11-12 LAB — PHOSPHORUS: Phosphate:MCnc:Pt:Ser/Plas:Qn:: 3.8

## 2019-11-12 LAB — ANION GAP: Anion gap 3:SCnc:Pt:Ser/Plas:Qn:: 7

## 2019-11-12 LAB — SMEAR REVIEW

## 2019-11-12 LAB — MAGNESIUM: Magnesium:MCnc:Pt:Ser/Plas:Qn:: 1.8

## 2019-11-12 LAB — SLIDE REVIEW

## 2019-11-12 MED ADMIN — belatacept (NULOJIX) 362.5 mg in sodium chloride (NS) 0.9 % 100 mL IVPB: 5 mg/kg | INTRAVENOUS | @ 14:00:00 | Stop: 2019-11-12

## 2019-11-12 MED FILL — PREDNISONE 5 MG TABLET: ORAL | 30 days supply | Qty: 30 | Fill #5

## 2019-11-12 MED FILL — PREDNISONE 5 MG TABLET: 30 days supply | Qty: 30 | Fill #5 | Status: AC

## 2019-11-12 NOTE — Unmapped (Signed)
2956 Patient arrived Transplant Infusion Room today for Belatacept, Condition: well; Mobility: ambulating; accompanied by self.   See Flowsheet and MAR for all details of visit.   1001 VS stable.  1005 PIV placed and secured with coban, NS KVO; labs collected and sent, urine no orders found.  1007 Infusion initiated.  1037 Infusion complete.   1045 VS stable, Line flushed with NS, PIV removed and secured with coban, pt left clinic, Condition: well; Mobility: ambulating; accompanied by self.

## 2019-11-20 ENCOUNTER — Other Ambulatory Visit: Payer: Self-pay | Admitting: Pharmacist

## 2019-11-24 ENCOUNTER — Encounter: Admit: 2019-11-24 | Discharge: 2019-11-25 | Payer: PRIVATE HEALTH INSURANCE

## 2019-11-24 LAB — CBC W/ AUTO DIFF
BASOPHILS ABSOLUTE COUNT: 0 10*9/L (ref 0.0–0.1)
BASOPHILS RELATIVE PERCENT: 0.7 %
EOSINOPHILS ABSOLUTE COUNT: 0 10*9/L (ref 0.0–0.7)
EOSINOPHILS RELATIVE PERCENT: 0.9 %
HEMOGLOBIN: 11.5 g/dL — ABNORMAL LOW (ref 13.5–17.5)
LYMPHOCYTES ABSOLUTE COUNT: 0.6 10*9/L — ABNORMAL LOW (ref 0.7–4.0)
LYMPHOCYTES RELATIVE PERCENT: 20.7 %
MEAN CORPUSCULAR HEMOGLOBIN CONC: 34.4 g/dL (ref 30.0–36.0)
MEAN CORPUSCULAR HEMOGLOBIN: 30.4 pg (ref 26.0–34.0)
MEAN CORPUSCULAR VOLUME: 88.2 fL (ref 81.0–95.0)
MEAN PLATELET VOLUME: 7.1 fL (ref 7.0–10.0)
MONOCYTES ABSOLUTE COUNT: 0.2 10*9/L (ref 0.1–1.0)
MONOCYTES RELATIVE PERCENT: 9 %
NEUTROPHILS ABSOLUTE COUNT: 1.9 10*9/L (ref 1.7–7.7)
NEUTROPHILS RELATIVE PERCENT: 68.7 %
PLATELET COUNT: 234 10*9/L (ref 150–450)
RED BLOOD CELL COUNT: 3.78 10*12/L — ABNORMAL LOW (ref 4.32–5.72)
RED CELL DISTRIBUTION WIDTH: 16.2 % — ABNORMAL HIGH (ref 12.0–15.0)
WBC ADJUSTED: 2.7 10*9/L — ABNORMAL LOW (ref 3.5–10.5)

## 2019-11-24 LAB — BASIC METABOLIC PANEL
ANION GAP: 5 mmol/L (ref 3–11)
BLOOD UREA NITROGEN: 17 mg/dL (ref 9–23)
BUN / CREAT RATIO: 11
CALCIUM: 9.4 mg/dL (ref 8.7–10.4)
CHLORIDE: 108 mmol/L — ABNORMAL HIGH (ref 98–107)
CO2: 26 mmol/L (ref 20.0–31.0)
EGFR CKD-EPI AA MALE: 66 mL/min/{1.73_m2}
EGFR CKD-EPI NON-AA MALE: 57 mL/min/{1.73_m2}
GLUCOSE RANDOM: 107 mg/dL (ref 70–179)
POTASSIUM: 3.8 mmol/L (ref 3.5–5.1)
SODIUM: 139 mmol/L (ref 135–145)

## 2019-11-24 LAB — PHOSPHORUS: Phosphate:MCnc:Pt:Ser/Plas:Qn:: 2.5

## 2019-11-24 LAB — CREATININE: Creatinine:MCnc:Pt:Ser/Plas:Qn:: 1.56 — ABNORMAL HIGH

## 2019-11-24 LAB — MAGNESIUM
MAGNESIUM: 1.7 mg/dL (ref 1.6–2.6)
Magnesium:MCnc:Pt:Ser/Plas:Qn:: 1.7

## 2019-11-24 LAB — MEAN CORPUSCULAR VOLUME: Erythrocyte mean corpuscular volume:EntVol:Pt:RBC:Qn:Automated count: 88.2

## 2019-11-25 ENCOUNTER — Encounter
Admit: 2019-11-25 | Discharge: 2019-11-26 | Payer: PRIVATE HEALTH INSURANCE | Attending: Nephrology | Primary: Nephrology

## 2019-11-25 DIAGNOSIS — Z940 Kidney transplant status: Principal | ICD-10-CM

## 2019-11-25 DIAGNOSIS — D849 Immunodeficiency, unspecified: Principal | ICD-10-CM

## 2019-11-25 LAB — URINALYSIS
BILIRUBIN UA: NEGATIVE
BLOOD UA: NEGATIVE
GLUCOSE UA: NEGATIVE
KETONES UA: NEGATIVE
LEUKOCYTE ESTERASE UA: NEGATIVE
NITRITE UA: NEGATIVE
PH UA: 6 (ref 5.0–9.0)
RBC UA: 2 /HPF (ref <3)
RENAL TUBULAR EPITHELIAL CELLS: 3 /HPF — ABNORMAL HIGH
SPECIFIC GRAVITY UA: 1.025 (ref 1.005–1.030)
SQUAMOUS EPITHELIAL: <1 /HPF (ref 0–5)
UROBILINOGEN UA: 0.2
WBC UA: 2 /HPF — ABNORMAL HIGH (ref <2)

## 2019-11-25 LAB — PROTEIN URINE: Protein:MCnc:Pt:Urine:Qn:: 37

## 2019-11-25 LAB — COLOR

## 2019-11-25 MED ORDER — LOSARTAN 25 MG TABLET
ORAL_TABLET | Freq: Two times a day (BID) | ORAL | 11 refills | 30.00000 days | Status: CP
Start: 2019-11-25 — End: 2020-11-24

## 2019-11-25 NOTE — Unmapped (Signed)
Transplant Coordinator, Clinic Visit   Pt seen today by transplant nephrology for follow up, reviewed medications and symptoms. Pt states he is doing well, continues to work full time in [employer]. Is aware he needs to increase PO fluids if sweating significantly while working.          11/25/19 1112   BP: 135/77   Pulse: 75   Temp: 36.7 ??C (98 ??F)   Weight: 74.2 kg (163 lb 9.6 oz)   Height: 182.9 cm (6')   PainSc: 0-No pain       Assessment  BP: 120/70s at home  Headache: occasional  Hand tremors: very slight   Numbness/tingling: denies  Fevers: denies  Chills/sweats: denies  Shortness of breath: denies  Chest pain or pressure: denies  Palpitations: denies  Nausea/vomiting: denies  Diarrhea/constipation: denies  UTI symptoms: denies  Swelling: denies  Pain: denies    Good appetite; reports adequate hydration.     Any new medications? no  Immunosuppressant last taken: receiving monthly Belatacept infusions    Immunization status: is not interested in receiving COVID-19 vaccine at this time    Functional Score: 90     Able to carry on normal activity;  Minor signs or symptoms of disease.  Employment status is: works full time    Per Dr. Toni Arthurs, switching from Hydralazine to Losartan 25mg  BID.     I spent a total of 10 minutes with Ryan Moon reviewing medications and symptoms.

## 2019-11-25 NOTE — Unmapped (Signed)
AOBP   Patient's blood pressure was taken on right upper arm, medium cuff.   1st reading 143/79, pulse: 77  2nd reading 134/76, pulse: 76  3rd reading 129/77, pulse: 73  Average reading 135/77, pulse: 75

## 2019-12-02 ENCOUNTER — Encounter: Admit: 2019-12-02 | Discharge: 2019-12-03 | Payer: PRIVATE HEALTH INSURANCE

## 2019-12-02 LAB — PHOSPHORUS: Phosphate:MCnc:Pt:Ser/Plas:Qn:: 2.5

## 2019-12-02 LAB — CBC W/ AUTO DIFF
BASOPHILS RELATIVE PERCENT: 0.7 %
EOSINOPHILS ABSOLUTE COUNT: 0 10*9/L (ref 0.0–0.7)
EOSINOPHILS RELATIVE PERCENT: 1.6 %
HEMOGLOBIN: 12.2 g/dL — ABNORMAL LOW (ref 13.5–17.5)
LYMPHOCYTES ABSOLUTE COUNT: 0.5 10*9/L — ABNORMAL LOW (ref 0.7–4.0)
LYMPHOCYTES RELATIVE PERCENT: 17 %
MEAN CORPUSCULAR HEMOGLOBIN CONC: 34.1 g/dL (ref 30.0–36.0)
MEAN CORPUSCULAR VOLUME: 88.7 fL (ref 81.0–95.0)
MEAN PLATELET VOLUME: 7.1 fL (ref 7.0–10.0)
MONOCYTES ABSOLUTE COUNT: 0.2 10*9/L (ref 0.1–1.0)
MONOCYTES RELATIVE PERCENT: 7.7 %
NEUTROPHILS ABSOLUTE COUNT: 2.1 10*9/L (ref 1.7–7.7)
NEUTROPHILS RELATIVE PERCENT: 73 %
NUCLEATED RED BLOOD CELLS: 0 /100{WBCs} (ref <=4)
PLATELET COUNT: 213 10*9/L (ref 150–450)
RED BLOOD CELL COUNT: 4.03 10*12/L — ABNORMAL LOW (ref 4.32–5.72)
RED CELL DISTRIBUTION WIDTH: 15.9 % — ABNORMAL HIGH (ref 12.0–15.0)
WBC ADJUSTED: 2.8 10*9/L — ABNORMAL LOW (ref 3.5–10.5)

## 2019-12-02 LAB — COMPREHENSIVE METABOLIC PANEL
ALBUMIN: 4.1 g/dL (ref 3.4–5.0)
ALKALINE PHOSPHATASE: 94 U/L (ref 46–116)
ALT (SGPT): 29 U/L (ref 10–49)
ANION GAP: 6 mmol/L (ref 3–11)
AST (SGOT): 25 U/L (ref <34)
BILIRUBIN TOTAL: 0.6 mg/dL (ref 0.3–1.2)
BLOOD UREA NITROGEN: 20 mg/dL (ref 9–23)
BUN / CREAT RATIO: 14
CALCIUM: 9.3 mg/dL (ref 8.7–10.4)
CHLORIDE: 106 mmol/L (ref 98–107)
CO2: 24.6 mmol/L (ref 20.0–31.0)
CREATININE: 1.39 mg/dL — ABNORMAL HIGH (ref 0.60–1.10)
EGFR CKD-EPI AA MALE: 75 mL/min/{1.73_m2}
EGFR CKD-EPI NON-AA MALE: 65 mL/min/{1.73_m2}
SODIUM: 137 mmol/L (ref 135–145)

## 2019-12-02 LAB — SMEAR REVIEW

## 2019-12-02 LAB — MAGNESIUM: Magnesium:MCnc:Pt:Ser/Plas:Qn:: 1.8

## 2019-12-02 LAB — CALCIUM: Calcium:MCnc:Pt:Ser/Plas:Qn:: 9.3

## 2019-12-02 LAB — MONOCYTES RELATIVE PERCENT: Monocytes/100 leukocytes:NFr:Pt:Bld:Qn:Automated count: 7.7

## 2019-12-03 LAB — CMV COMMENT: Lab: 0

## 2019-12-03 LAB — CMV DNA, QUANTITATIVE, PCR: CMV VIRAL LD: NOT DETECTED

## 2019-12-04 LAB — VITAMIN D, TOTAL (25OH): Lab: 38.7

## 2019-12-05 LAB — HEMOGLOBIN A1C: Hemoglobin A1c/Hemoglobin.total:MFr:Pt:Bld:Qn:: 5.2

## 2019-12-08 LAB — HLA DS POST TRANSPLANT
ANTI-DONOR DRW #1 MFI: 109 MFI
ANTI-DONOR DRW #2 MFI: 50 MFI
ANTI-DONOR HLA-A #1 MFI: 23 MFI
ANTI-DONOR HLA-A #2 MFI: 85 MFI
ANTI-DONOR HLA-B #1 MFI: 2 MFI
ANTI-DONOR HLA-B #2 MFI: 31 MFI
ANTI-DONOR HLA-C #1 MFI: 0 MFI
ANTI-DONOR HLA-C #2 MFI: 1 MFI
ANTI-DONOR HLA-DQB #1 MFI: 37 MFI
ANTI-DONOR HLA-DQB #2 MFI: 37 MFI
ANTI-DONOR HLA-DR #1 MFI: 26 MFI
ANTI-DONOR HLA-DR #2 MFI: 72 MFI

## 2019-12-08 LAB — HLA CL2 AB RESULT: Lab: NEGATIVE

## 2019-12-08 LAB — ANTI-DONOR HLA-DR #2 MFI: Lab: 72

## 2019-12-08 LAB — HLA CL1 ANTIBODY COMM: Lab: 0

## 2019-12-08 LAB — FSAB CLASS 2 ANTIBODY SPECIFICITY: HLA CL2 AB RESULT: NEGATIVE

## 2019-12-08 NOTE — Unmapped (Signed)
[facility] Refill Coordination Note    Specialty Medication(s) to be Shipped:   Transplant: Myfortic 180mg  and Prednisone 5mg     Other medication(s) to be shipped:Amlodipine and Aspirin 81mg      [name], DOB: 02/08/1986  Phone: [phone] (home)       All above HIPAA information was verified with patient.     Was a Nurse, learning disability used for this call? No    Completed refill call assessment today to schedule patient's medication shipment from the [facility] [phone]).       Specialty medication(s) and dose(s) confirmed: Regimen is correct and unchanged.   Changes to medications: Deroy reports no changes at this time.  Changes to insurance: No  Questions for the pharmacist: No    Confirmed patient received Welcome Packet with first shipment. The patient will receive a drug information handout for each medication shipped and additional FDA Medication Guides as required.       DISEASE/MEDICATION-SPECIFIC INFORMATION        N/A    SPECIALTY MEDICATION ADHERENCE     Medication Adherence    Patient reported X missed doses in the last month: 0  Specialty Medication: Myfortic 180 mg  Patient is on additional specialty medications: Yes  Additional Specialty Medications: Prednisone 5 mg  Patient Reported Additional Medication X Missed Doses in the Last Month: 0  Patient is on more than two specialty medications: No  Any gaps in refill history greater than 2 weeks in the last 3 months: no  Demonstrates understanding of importance of adherence: yes  Informant: patient  Reliability of informant: reliable  Confirmed plan for next specialty medication refill: delivery by pharmacy  Refills needed for supportive medications: not needed                Myfortic 180 mg: 9 days of medicine on hand   Prednisone 5 mg: 9 days of medicine on hand         SHIPPING     Shipping address confirmed in Epic.     Delivery Scheduled: Yes, Expected medication delivery date: 12/11/2019.     Medication will be delivered via Next Day Courier to the prescription address in Epic WAM.    Anyra Kaufman D Nissa Stannard   [facility] Specialty Technician

## 2019-12-09 NOTE — Unmapped (Signed)
[employer] transplant nephrology clinic visit    assessment and plan  1. s/p kidney transplant 04/28/2019. baseline creatinine 1.5-1.8 mg/dl. no proteinuria. no donor specific hla ab detected.   2. immunosuppression. belatacept 5mg /kg q.month. prednisone 5mg  daily. mycophenolate 360mg  bid.  3. hypertension. +losartan 25mg  bid in place of hydralazine. blood pressure goal <130/80 mmhg.  4. preventive medicine. influenza '20. pcv13 pnuemococcal '19. ppsv23 pneumococcal '19. covid-19 vaccine strongly recommended; Ryan Moon declined at today's clinic appt.    history of present illness    Ryan Moon is a 34 year old gentleman seen in follow up post kidney transplant 04/28/2019. he feels significantly improved since transition from tacrolimus to belatacept maintenance d/t headache, tremor, decr focus and msk pain. no acute concerns. no fever chills or sweats. no headache or lightheaded. no chest pain palpitations or shortness of breath. no lower extremity edema. appetite nl. no abdominal pain no n/v/d. +mild low back pain. works full time Ryan Moon. +minimal tremor. no dysuria hematuria or difficulty voiding. all other systems reviewed and negative x10 systems.    past medical hx:  1. 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 baseline creatinine 1.5-1.8 mg/dl.   > kidney bx 01/21: no acute/chr rejection.  2. hypertension  3. anxiety/depression    allergies: nkda    medications: belatacept 5mg /kg q.month, mycophenolate 360mg  bid, prednisone 5mg  daily, aspirin 81mg  daily, amlodipine 10mg  daily, hydralazine 100mg  bid, mg oxide 133mg  daily, omeprazole 20mg  daily, escitalopram 15mg  daily, buspirone 15mg  bid, alprazolam 0.5mg  prn.    soc hx: married x2 children. works Ryan Moon. chr marijuana use.    physical exam: t97 p78 bp121/85 wt72.4kg bmi 21.6. wd/wn gentleman appropriate affect and mood. sclera anicteric. wearing mask. neck supple no palpable ln. heart rrr nl s1s2 no m/r/g. lungs clear bilateral. abd soft nt/nd. no lower ext edema. msk no synovitis/tophi. skin no rash. neuro alert oriented non focal exam.    labs 11/24/19: wbc2.7 hgb11.5 hct33.4 plts234. ZO109 k3.8 cl108 bicarb26 bun17 cr1.56 glc107 ca9.4 mg1.7 phos2.5.    labs 11/25/19: urinalysis 6/1.025 trace protein no blood. urine protein/cr 0.308. urine cytology: no decoy or malignant cells detected.

## 2019-12-10 ENCOUNTER — Encounter: Admit: 2019-12-10 | Discharge: 2019-12-11 | Payer: PRIVATE HEALTH INSURANCE

## 2019-12-10 DIAGNOSIS — Z79899 Other long term (current) drug therapy: Principal | ICD-10-CM

## 2019-12-10 DIAGNOSIS — Z940 Kidney transplant status: Principal | ICD-10-CM

## 2019-12-10 LAB — CBC W/ AUTO DIFF
BASOPHILS ABSOLUTE COUNT: 0 10*9/L (ref 0.0–0.1)
BASOPHILS RELATIVE PERCENT: 0.4 %
EOSINOPHILS ABSOLUTE COUNT: 0.1 10*9/L (ref 0.0–0.4)
HEMATOCRIT: 33.3 % — ABNORMAL LOW (ref 41.0–53.0)
HEMOGLOBIN: 11.4 g/dL — ABNORMAL LOW (ref 13.5–17.5)
LARGE UNSTAINED CELLS: 4 % (ref 0–4)
LYMPHOCYTES RELATIVE PERCENT: 30.9 %
MEAN CORPUSCULAR HEMOGLOBIN CONC: 34.3 g/dL (ref 31.0–37.0)
MEAN CORPUSCULAR HEMOGLOBIN: 31.2 pg (ref 26.0–34.0)
MEAN CORPUSCULAR VOLUME: 91 fL (ref 80.0–100.0)
MEAN PLATELET VOLUME: 8.1 fL (ref 7.0–10.0)
MONOCYTES ABSOLUTE COUNT: 0.2 10*9/L (ref 0.2–0.8)
MONOCYTES RELATIVE PERCENT: 10.6 %
NEUTROPHILS ABSOLUTE COUNT: 1 10*9/L — ABNORMAL LOW (ref 2.0–7.5)
NEUTROPHILS RELATIVE PERCENT: 51.2 %
RED BLOOD CELL COUNT: 3.66 10*12/L — ABNORMAL LOW (ref 4.50–5.90)
RED CELL DISTRIBUTION WIDTH: 16.4 % — ABNORMAL HIGH (ref 12.0–15.0)
WBC ADJUSTED: 1.9 10*9/L — ABNORMAL LOW (ref 4.5–11.0)

## 2019-12-10 LAB — EGFR CKD-EPI NON-AA MALE
Glomerular filtration rate/1.73 sq M.predicted.non black:ArVRat:Pt:Ser/Plas/Bld:Qn:Creatinine-based formula (CKD-EPI): 55 — ABNORMAL LOW

## 2019-12-10 LAB — BASIC METABOLIC PANEL
ANION GAP: 5 mmol/L — ABNORMAL LOW (ref 7–15)
BLOOD UREA NITROGEN: 30 mg/dL — ABNORMAL HIGH (ref 7–21)
BUN / CREAT RATIO: 19
CALCIUM: 9.3 mg/dL (ref 8.5–10.2)
CHLORIDE: 106 mmol/L (ref 98–107)
CO2: 24 mmol/L (ref 22.0–30.0)
EGFR CKD-EPI NON-AA MALE: 55 mL/min/{1.73_m2} — ABNORMAL LOW (ref >=60)
GLUCOSE RANDOM: 83 mg/dL (ref 70–179)
POTASSIUM: 4.2 mmol/L (ref 3.5–5.0)
SODIUM: 135 mmol/L (ref 135–145)

## 2019-12-10 LAB — SMEAR REVIEW

## 2019-12-10 LAB — PHOSPHORUS: Phosphate:MCnc:Pt:Ser/Plas:Qn:: 3.9

## 2019-12-10 LAB — MAGNESIUM: Magnesium:MCnc:Pt:Ser/Plas:Qn:: 2

## 2019-12-10 LAB — NEUTROPHILS RELATIVE PERCENT: Neutrophils/100 leukocytes:NFr:Pt:Bld:Qn:Automated count: 51.2

## 2019-12-10 MED ORDER — MYCOPHENOLATE SODIUM 180 MG TABLET,DELAYED RELEASE
ORAL_TABLET | Freq: Two times a day (BID) | ORAL | 11 refills | 30.00000 days | Status: CP
Start: 2019-12-10 — End: 2020-12-09
  Filled 2020-01-06: qty 60, 30d supply, fill #0

## 2019-12-10 MED ADMIN — belatacept (NULOJIX) 375 mg in sodium chloride (NS) 0.9 % 100 mL IVPB: 5 mg/kg | INTRAVENOUS | @ 14:00:00 | Stop: 2019-12-10

## 2019-12-10 MED FILL — AMLODIPINE 10 MG TABLET: ORAL | 90 days supply | Qty: 90 | Fill #1

## 2019-12-10 MED FILL — ASPIRIN 81 MG TABLET,DELAYED RELEASE: ORAL | 30 days supply | Qty: 30 | Fill #7

## 2019-12-10 MED FILL — AMLODIPINE 10 MG TABLET: 90 days supply | Qty: 90 | Fill #1 | Status: AC

## 2019-12-10 MED FILL — MYFORTIC 180 MG TABLET,DELAYED RELEASE: ORAL | 30 days supply | Qty: 120 | Fill #2

## 2019-12-10 MED FILL — ASPIRIN 81 MG TABLET,DELAYED RELEASE: 30 days supply | Qty: 30 | Fill #7 | Status: AC

## 2019-12-10 MED FILL — MYFORTIC 180 MG TABLET,DELAYED RELEASE: 30 days supply | Qty: 120 | Fill #2 | Status: AC

## 2019-12-10 NOTE — Unmapped (Signed)
Reviewed WBC 1.9 with Dr. Toni Arthurs, pt to decrease Myfortic to 180mg  BID. Pt made aware, verbalized understanding. Updated prescription sent to [facility].

## 2019-12-10 NOTE — Unmapped (Signed)
1010 Patient arrived Transplant Infusion Room today for Belatacept, Condition: well; Mobility: ambulating; accompanied by self.   See Flowsheet and MAR for all details of visit.   1012 VS stable,  1025  PIV placed, NS KVO; labs collected and sent, urine no orders found.  1028 Infusion initiated.  1058 Infusion complete.  1105 VS stable, PIV removed, pt left clinic, Condition: well; Mobility: ambulating; accompanied by self.

## 2019-12-10 NOTE — Unmapped (Signed)
Ryan Moon 's PREDNISONE shipment will be delayed as a result of the medication is too soon to refill until 12/12/19.     I have reached out to the patient and communicated the delivery change. We will reschedule the medication for the delivery date that the patient agreed upon.  We have confirmed the delivery date as 12/15/19, via ups.

## 2019-12-11 NOTE — Unmapped (Signed)
Clinical Assessment Needed For: Dose Change  Medication: Myfortic  Last Fill Date: 12/10/2019  Refill Too Soon until 01/04/2020  Was previous dose already scheduled to fill: No    Notes to Pharmacist: N/A

## 2019-12-14 MED FILL — PREDNISONE 5 MG TABLET: ORAL | 30 days supply | Qty: 30 | Fill #6

## 2019-12-14 MED FILL — PREDNISONE 5 MG TABLET: 30 days supply | Qty: 30 | Fill #6 | Status: AC

## 2019-12-15 ENCOUNTER — Ambulatory Visit: Admit: 2019-12-15 | Discharge: 2019-12-16 | Payer: PRIVATE HEALTH INSURANCE

## 2019-12-15 LAB — CBC W/ AUTO DIFF
BASOPHILS ABSOLUTE COUNT: 0 10*9/L (ref 0.0–0.1)
BASOPHILS RELATIVE PERCENT: 0.6 %
EOSINOPHILS ABSOLUTE COUNT: 0.1 10*9/L (ref 0.0–0.7)
EOSINOPHILS RELATIVE PERCENT: 4.7 %
HEMATOCRIT: 33.6 % — ABNORMAL LOW (ref 38.0–50.0)
LYMPHOCYTES ABSOLUTE COUNT: 0.7 10*9/L (ref 0.7–4.0)
LYMPHOCYTES RELATIVE PERCENT: 28.2 %
MEAN CORPUSCULAR HEMOGLOBIN CONC: 34.2 g/dL (ref 30.0–36.0)
MEAN CORPUSCULAR HEMOGLOBIN: 30.7 pg (ref 26.0–34.0)
MEAN CORPUSCULAR VOLUME: 89.6 fL (ref 81.0–95.0)
MEAN PLATELET VOLUME: 7.4 fL (ref 7.0–10.0)
MONOCYTES ABSOLUTE COUNT: 0.3 10*9/L (ref 0.1–1.0)
MONOCYTES RELATIVE PERCENT: 12 %
NEUTROPHILS ABSOLUTE COUNT: 1.3 10*9/L — ABNORMAL LOW (ref 1.7–7.7)
NUCLEATED RED BLOOD CELLS: 0 /100{WBCs} (ref <=4)
PLATELET COUNT: 207 10*9/L (ref 150–450)
RED BLOOD CELL COUNT: 3.75 10*12/L — ABNORMAL LOW (ref 4.32–5.72)

## 2019-12-15 LAB — BASIC METABOLIC PANEL
ANION GAP: <1 mmol/L — ABNORMAL LOW (ref 5–14)
BUN / CREAT RATIO: 16
CALCIUM: 9.3 mg/dL (ref 8.7–10.4)
CHLORIDE: 109 mmol/L — ABNORMAL HIGH (ref 98–107)
CO2: 27.9 mmol/L (ref 20.0–31.0)
CREATININE: 1.67 mg/dL — ABNORMAL HIGH
EGFR CKD-EPI AA MALE: 60 mL/min/{1.73_m2} (ref >=60)
EGFR CKD-EPI NON-AA MALE: 52 mL/min/{1.73_m2} — ABNORMAL LOW (ref >=60)
GLUCOSE RANDOM: 89 mg/dL (ref 70–179)
SODIUM: 136 mmol/L (ref 135–145)

## 2019-12-15 LAB — MAGNESIUM: Magnesium:MCnc:Pt:Ser/Plas:Qn:: 1.9

## 2019-12-15 LAB — BLOOD UREA NITROGEN: Urea nitrogen:MCnc:Pt:Ser/Plas:Qn:: 27 — ABNORMAL HIGH

## 2019-12-15 LAB — PHOSPHORUS: Phosphate:MCnc:Pt:Ser/Plas:Qn:: 3.7

## 2019-12-15 LAB — MEAN PLATELET VOLUME: Platelet mean volume:EntVol:Pt:Bld:Qn:Automated count: 7.4

## 2019-12-22 ENCOUNTER — Encounter: Admit: 2019-12-22 | Discharge: 2019-12-23 | Payer: PRIVATE HEALTH INSURANCE

## 2019-12-22 LAB — BLOOD UREA NITROGEN: Urea nitrogen:MCnc:Pt:Ser/Plas:Qn:: 20

## 2019-12-22 LAB — CBC W/ AUTO DIFF
BASOPHILS ABSOLUTE COUNT: 0 10*9/L (ref 0.0–0.1)
BASOPHILS RELATIVE PERCENT: 0.8 %
HEMATOCRIT: 33.9 % — ABNORMAL LOW (ref 38.0–50.0)
HEMOGLOBIN: 11.5 g/dL — ABNORMAL LOW (ref 13.5–17.5)
LYMPHOCYTES ABSOLUTE COUNT: 0.7 10*9/L (ref 0.7–4.0)
LYMPHOCYTES RELATIVE PERCENT: 29.5 %
MEAN CORPUSCULAR HEMOGLOBIN: 30.4 pg (ref 26.0–34.0)
MEAN CORPUSCULAR VOLUME: 89.8 fL (ref 81.0–95.0)
MEAN PLATELET VOLUME: 7.1 fL (ref 7.0–10.0)
MONOCYTES ABSOLUTE COUNT: 0.3 10*9/L (ref 0.1–1.0)
NEUTROPHILS ABSOLUTE COUNT: 1.2 10*9/L — ABNORMAL LOW (ref 1.7–7.7)
NEUTROPHILS RELATIVE PERCENT: 52.1 %
NUCLEATED RED BLOOD CELLS: 0 /100{WBCs} (ref <=4)
PLATELET COUNT: 215 10*9/L (ref 150–450)
RED BLOOD CELL COUNT: 3.78 10*12/L — ABNORMAL LOW (ref 4.32–5.72)
RED CELL DISTRIBUTION WIDTH: 16.6 % — ABNORMAL HIGH (ref 12.0–15.0)
WBC ADJUSTED: 2.4 10*9/L — ABNORMAL LOW (ref 3.5–10.5)

## 2019-12-22 LAB — BASIC METABOLIC PANEL
ANION GAP: 2 mmol/L — ABNORMAL LOW (ref 5–14)
BLOOD UREA NITROGEN: 20 mg/dL (ref 9–23)
BUN / CREAT RATIO: 11
CALCIUM: 9.5 mg/dL (ref 8.7–10.4)
CHLORIDE: 110 mmol/L — ABNORMAL HIGH (ref 98–107)
CO2: 27.1 mmol/L (ref 20.0–31.0)
CREATININE: 1.85 mg/dL — ABNORMAL HIGH
EGFR CKD-EPI AA MALE: 53 mL/min/{1.73_m2} — ABNORMAL LOW (ref >=60)
EGFR CKD-EPI NON-AA MALE: 46 mL/min/{1.73_m2} — ABNORMAL LOW (ref >=60)
SODIUM: 139 mmol/L (ref 135–145)

## 2019-12-22 LAB — MAGNESIUM: Magnesium:MCnc:Pt:Ser/Plas:Qn:: 2.2

## 2019-12-22 LAB — WBC ADJUSTED: Leukocytes:NCnc:Pt:Bld:Qn:: 2.4 — ABNORMAL LOW

## 2019-12-22 LAB — PHOSPHORUS: Phosphate:MCnc:Pt:Ser/Plas:Qn:: 3.7

## 2019-12-30 ENCOUNTER — Encounter: Admit: 2019-12-30 | Discharge: 2019-12-31 | Payer: PRIVATE HEALTH INSURANCE

## 2019-12-30 LAB — CBC W/ AUTO DIFF
BASOPHILS ABSOLUTE COUNT: 0 10*9/L (ref 0.0–0.1)
BASOPHILS RELATIVE PERCENT: 0.6 %
EOSINOPHILS ABSOLUTE COUNT: 0.1 10*9/L (ref 0.0–0.7)
EOSINOPHILS RELATIVE PERCENT: 2.8 %
HEMATOCRIT: 34.5 % — ABNORMAL LOW (ref 38.0–50.0)
HEMOGLOBIN: 11.9 g/dL — ABNORMAL LOW (ref 13.5–17.5)
LYMPHOCYTES ABSOLUTE COUNT: 0.8 10*9/L (ref 0.7–4.0)
LYMPHOCYTES RELATIVE PERCENT: 33.1 %
MEAN CORPUSCULAR HEMOGLOBIN: 31 pg (ref 26.0–34.0)
MEAN CORPUSCULAR VOLUME: 90.2 fL (ref 81.0–95.0)
MEAN PLATELET VOLUME: 7.2 fL (ref 7.0–10.0)
MONOCYTES ABSOLUTE COUNT: 0.3 10*9/L (ref 0.1–1.0)
NEUTROPHILS ABSOLUTE COUNT: 1.3 10*9/L — ABNORMAL LOW (ref 1.7–7.7)
NEUTROPHILS RELATIVE PERCENT: 52.2 %
NUCLEATED RED BLOOD CELLS: 0 /100{WBCs} (ref <=4)
PLATELET COUNT: 208 10*9/L (ref 150–450)
RED CELL DISTRIBUTION WIDTH: 16.2 % — ABNORMAL HIGH (ref 12.0–15.0)
WBC ADJUSTED: 2.6 10*9/L — ABNORMAL LOW (ref 3.5–10.5)

## 2019-12-30 LAB — MEAN PLATELET VOLUME: Platelet mean volume:EntVol:Pt:Bld:Qn:Automated count: 7.2

## 2019-12-30 LAB — BASIC METABOLIC PANEL
ANION GAP: 2 mmol/L — ABNORMAL LOW (ref 5–14)
BLOOD UREA NITROGEN: 21 mg/dL (ref 9–23)
BUN / CREAT RATIO: 12
CALCIUM: 9.9 mg/dL (ref 8.7–10.4)
CHLORIDE: 110 mmol/L — ABNORMAL HIGH (ref 98–107)
CO2: 26.1 mmol/L (ref 20.0–31.0)
CREATININE: 1.7 mg/dL — ABNORMAL HIGH
EGFR CKD-EPI NON-AA MALE: 51 mL/min/{1.73_m2} — ABNORMAL LOW (ref >=60)
GLUCOSE RANDOM: 87 mg/dL (ref 70–179)
POTASSIUM: 3.9 mmol/L (ref 3.4–4.5)
SODIUM: 138 mmol/L (ref 135–145)

## 2019-12-30 LAB — ANION GAP: Anion gap 3:SCnc:Pt:Ser/Plas:Qn:: 2 — ABNORMAL LOW

## 2019-12-30 LAB — SLIDE REVIEW

## 2019-12-30 LAB — PHOSPHORUS: Phosphate:MCnc:Pt:Ser/Plas:Qn:: 3.3

## 2019-12-30 LAB — MAGNESIUM: Magnesium:MCnc:Pt:Ser/Plas:Qn:: 1.9

## 2019-12-30 LAB — SMEAR REVIEW

## 2019-12-30 NOTE — Unmapped (Signed)
[facility] Refill Coordination Note    Specialty Medication(s) to be Shipped:   Transplant: Myfortic 180mg     Other medication(s) to be shipped: Aspirin     [name], DOB: [birth year]  Phone: [phone] (home)       All above HIPAA information was verified with patient.     Was a Nurse, learning disability used for this call? No    Completed refill call assessment today to schedule patient's medication shipment from the [facility] [phone]).       Specialty medication(s) and dose(s) confirmed: Myfortic dose change to 1 tablet (180 mg total) by mouth Two (2) times a day   Changes to medications: Goebel reports no changes at this time.  Changes to insurance: No  Questions for the pharmacist: No    Confirmed patient received Welcome Packet with first shipment. The patient will receive a drug information handout for each medication shipped and additional FDA Medication Guides as required.       DISEASE/MEDICATION-SPECIFIC INFORMATION        N/A    SPECIALTY MEDICATION ADHERENCE     Medication Adherence    Patient reported X missed doses in the last month: 0        Myfortic 180mg   10 days worth of medication on hand.          SHIPPING     Shipping address confirmed in Epic.     Delivery Scheduled: Yes, Expected medication delivery date: 01/07/20.     Medication will be delivered via UPS to the prescription address in Epic WAM.    [place] A Rainy Rothman   [facility] Specialty Technician

## 2020-01-04 NOTE — Unmapped (Signed)
Refill Too Soon Follow Up: No issues - Copay = $0.00

## 2020-01-05 ENCOUNTER — Encounter: Admit: 2020-01-05 | Discharge: 2020-01-06 | Payer: PRIVATE HEALTH INSURANCE

## 2020-01-05 LAB — CBC W/ AUTO DIFF
BASOPHILS ABSOLUTE COUNT: 0 10*9/L (ref 0.0–0.1)
BASOPHILS RELATIVE PERCENT: 0.5 %
EOSINOPHILS RELATIVE PERCENT: 3.1 %
HEMOGLOBIN: 11.6 g/dL — ABNORMAL LOW (ref 13.5–17.5)
LYMPHOCYTES ABSOLUTE COUNT: 0.7 10*9/L (ref 0.7–4.0)
LYMPHOCYTES RELATIVE PERCENT: 34.2 %
MEAN CORPUSCULAR HEMOGLOBIN CONC: 34.3 g/dL (ref 30.0–36.0)
MEAN CORPUSCULAR HEMOGLOBIN: 31.1 pg (ref 26.0–34.0)
MEAN CORPUSCULAR VOLUME: 90.8 fL (ref 81.0–95.0)
MEAN PLATELET VOLUME: 7.1 fL (ref 7.0–10.0)
MONOCYTES ABSOLUTE COUNT: 0.3 10*9/L (ref 0.1–1.0)
MONOCYTES RELATIVE PERCENT: 13.5 %
NEUTROPHILS ABSOLUTE COUNT: 1 10*9/L — ABNORMAL LOW (ref 1.7–7.7)
NEUTROPHILS RELATIVE PERCENT: 48.7 %
NUCLEATED RED BLOOD CELLS: 0 /100{WBCs} (ref <=4)
PLATELET COUNT: 213 10*9/L (ref 150–450)
RED BLOOD CELL COUNT: 3.73 10*12/L — ABNORMAL LOW (ref 4.32–5.72)
RED CELL DISTRIBUTION WIDTH: 16.4 % — ABNORMAL HIGH (ref 12.0–15.0)
WBC ADJUSTED: 2.1 10*9/L — ABNORMAL LOW (ref 3.5–10.5)

## 2020-01-05 LAB — EOSINOPHILS RELATIVE PERCENT: Eosinophils/100 leukocytes:NFr:Pt:Bld:Qn:Automated count: 3.1

## 2020-01-05 LAB — BASIC METABOLIC PANEL
ANION GAP: 4 mmol/L — ABNORMAL LOW (ref 5–14)
BLOOD UREA NITROGEN: 22 mg/dL (ref 9–23)
CALCIUM: 10 mg/dL (ref 8.7–10.4)
CHLORIDE: 111 mmol/L — ABNORMAL HIGH (ref 98–107)
CO2: 26.1 mmol/L (ref 20.0–31.0)
CREATININE: 1.47 mg/dL — ABNORMAL HIGH
EGFR CKD-EPI AA MALE: 70 mL/min/{1.73_m2} (ref >=60)
EGFR CKD-EPI NON-AA MALE: 61 mL/min/{1.73_m2} (ref >=60)
GLUCOSE RANDOM: 112 mg/dL (ref 70–179)
POTASSIUM: 4.2 mmol/L (ref 3.4–4.5)
SODIUM: 141 mmol/L (ref 135–145)

## 2020-01-05 LAB — CHLORIDE: Chloride:SCnc:Pt:Ser/Plas:Qn:: 111 — ABNORMAL HIGH

## 2020-01-05 LAB — PHOSPHORUS
PHOSPHORUS: 3 mg/dL (ref 2.4–5.1)
Phosphate:MCnc:Pt:Ser/Plas:Qn:: 3

## 2020-01-05 LAB — MAGNESIUM: Magnesium:MCnc:Pt:Ser/Plas:Qn:: 1.9

## 2020-01-06 MED FILL — ASPIRIN 81 MG TABLET,DELAYED RELEASE: 30 days supply | Qty: 30 | Fill #8 | Status: AC

## 2020-01-06 MED FILL — ASPIRIN 81 MG TABLET,DELAYED RELEASE: ORAL | 30 days supply | Qty: 30 | Fill #8

## 2020-01-06 MED FILL — MYFORTIC 180 MG TABLET,DELAYED RELEASE: 30 days supply | Qty: 60 | Fill #0 | Status: AC

## 2020-01-14 ENCOUNTER — Encounter: Admit: 2020-01-14 | Discharge: 2020-01-15 | Payer: PRIVATE HEALTH INSURANCE

## 2020-01-14 LAB — BASIC METABOLIC PANEL
ANION GAP: 8 mmol/L (ref 5–14)
BLOOD UREA NITROGEN: 21 mg/dL (ref 9–23)
CALCIUM: 9.7 mg/dL (ref 8.7–10.4)
CHLORIDE: 105 mmol/L (ref 98–107)
CO2: 25 mmol/L (ref 20.0–31.0)
CREATININE: 1.59 mg/dL — ABNORMAL HIGH
EGFR CKD-EPI AA MALE: 64 mL/min/{1.73_m2} (ref >=60)
EGFR CKD-EPI NON-AA MALE: 55 mL/min/{1.73_m2} — ABNORMAL LOW (ref >=60)
GLUCOSE RANDOM: 71 mg/dL (ref 70–179)
POTASSIUM: 3.6 mmol/L (ref 3.4–4.5)
SODIUM: 138 mmol/L (ref 135–145)

## 2020-01-14 LAB — CBC W/ AUTO DIFF
BASOPHILS ABSOLUTE COUNT: 0 10*9/L (ref 0.0–0.1)
BASOPHILS RELATIVE PERCENT: 0.7 %
EOSINOPHILS RELATIVE PERCENT: 2.7 %
HEMATOCRIT: 37.5 % — ABNORMAL LOW (ref 41.0–53.0)
HEMOGLOBIN: 12.2 g/dL — ABNORMAL LOW (ref 13.5–17.5)
LARGE UNSTAINED CELLS: 3 % (ref 0–4)
LYMPHOCYTES ABSOLUTE COUNT: 0.6 10*9/L — ABNORMAL LOW (ref 1.5–5.0)
LYMPHOCYTES RELATIVE PERCENT: 23.6 %
MEAN CORPUSCULAR HEMOGLOBIN CONC: 32.6 g/dL (ref 31.0–37.0)
MEAN CORPUSCULAR HEMOGLOBIN: 30.9 pg (ref 26.0–34.0)
MEAN CORPUSCULAR VOLUME: 94.9 fL (ref 80.0–100.0)
MEAN PLATELET VOLUME: 9.6 fL (ref 7.0–10.0)
MONOCYTES ABSOLUTE COUNT: 0.4 10*9/L (ref 0.2–0.8)
MONOCYTES RELATIVE PERCENT: 14.1 %
NEUTROPHILS ABSOLUTE COUNT: 1.4 10*9/L — ABNORMAL LOW (ref 2.0–7.5)
NEUTROPHILS RELATIVE PERCENT: 56.4 %
PLATELET COUNT: 273 10*9/L (ref 150–440)
WBC ADJUSTED: 2.5 10*9/L — ABNORMAL LOW (ref 4.5–11.0)

## 2020-01-14 LAB — HYPOCHROMIA

## 2020-01-14 LAB — PHOSPHORUS: Phosphate:MCnc:Pt:Ser/Plas:Qn:: 3.1

## 2020-01-14 LAB — MAGNESIUM: Magnesium:MCnc:Pt:Ser/Plas:Qn:: 1.9

## 2020-01-14 LAB — CO2: Carbon dioxide:SCnc:Pt:Ser/Plas:Qn:: 25

## 2020-01-15 LAB — SMEAR REVIEW

## 2020-01-15 NOTE — Unmapped (Signed)
1010 Patient arrived Transplant Infusion Room today for belatacept 375mg , Condition: well; Mobility: ambulating; accompanied by self.   See Flowsheet and MAR for all details of visit.   1016 VS stable.  1028  PIV placed and secured with coban, NS KVO; labs collected and sent, urine no orders found.  1035 Infusion initiated.  1104 Infusion complete.   1116 VS stable, Line flushed with NS, PIV removed and secured with coban, pt left clinic, Condition: well; Mobility: ambulating; accompanied by self.

## 2020-01-18 NOTE — Unmapped (Signed)
Received call from patient over weekend stating he was having some gum swelling.     Spoke with patient this morning, he states gum swelling has resolved. Denied fevers, chills, N/V/D. Eating and drinking ok.     Provided pt with on-call kidney TNC number for future concerns over weekend, holidays and at night. Also advised pt to establish care with dentist, per message from wife on Sunday, pt does not currently have a dentist that he regularly sees.     Also advised pt to call back should his gums become swollen/inflammed again. Pt verbalized understanding.

## 2020-01-20 MED ORDER — ONDANSETRON 4 MG DISINTEGRATING TABLET
Freq: Three times a day (TID) | ORAL | 0 days | PRN
Start: 2020-01-20 — End: ?

## 2020-01-20 MED FILL — PREDNISONE 5 MG TABLET: ORAL | 30 days supply | Qty: 30 | Fill #7

## 2020-01-20 MED FILL — PREDNISONE 5 MG TABLET: 30 days supply | Qty: 30 | Fill #7 | Status: AC

## 2020-01-21 ENCOUNTER — Encounter: Admit: 2020-01-21 | Discharge: 2020-01-22 | Payer: PRIVATE HEALTH INSURANCE

## 2020-01-21 LAB — CBC W/ AUTO DIFF
BASOPHILS ABSOLUTE COUNT: 0 10*9/L (ref 0.0–0.1)
BASOPHILS RELATIVE PERCENT: 0.8 %
EOSINOPHILS ABSOLUTE COUNT: 0.1 10*9/L (ref 0.0–0.7)
EOSINOPHILS RELATIVE PERCENT: 2.3 %
HEMATOCRIT: 33.6 % — ABNORMAL LOW (ref 38.0–50.0)
HEMOGLOBIN: 11.5 g/dL — ABNORMAL LOW (ref 13.5–17.5)
LYMPHOCYTES ABSOLUTE COUNT: 1 10*9/L (ref 0.7–4.0)
LYMPHOCYTES RELATIVE PERCENT: 32.1 %
MEAN CORPUSCULAR HEMOGLOBIN: 31 pg (ref 26.0–34.0)
MEAN PLATELET VOLUME: 6.9 fL — ABNORMAL LOW (ref 7.0–10.0)
MONOCYTES ABSOLUTE COUNT: 0.4 10*9/L (ref 0.1–1.0)
MONOCYTES RELATIVE PERCENT: 13.1 %
NEUTROPHILS ABSOLUTE COUNT: 1.6 10*9/L — ABNORMAL LOW (ref 1.7–7.7)
NEUTROPHILS RELATIVE PERCENT: 51.7 %
NUCLEATED RED BLOOD CELLS: 0 /100{WBCs} (ref <=4)
PLATELET COUNT: 260 10*9/L (ref 150–450)
RED BLOOD CELL COUNT: 3.71 10*12/L — ABNORMAL LOW (ref 4.32–5.72)
RED CELL DISTRIBUTION WIDTH: 15.2 % — ABNORMAL HIGH (ref 12.0–15.0)
WBC ADJUSTED: 3 10*9/L — ABNORMAL LOW (ref 3.5–10.5)

## 2020-01-21 LAB — BASIC METABOLIC PANEL
BLOOD UREA NITROGEN: 23 mg/dL (ref 9–23)
BUN / CREAT RATIO: 14
CALCIUM: 9.8 mg/dL (ref 8.7–10.4)
CHLORIDE: 107 mmol/L (ref 98–107)
CO2: 28.1 mmol/L (ref 20.0–31.0)
CREATININE: 1.6 mg/dL — ABNORMAL HIGH
EGFR CKD-EPI AA MALE: 63 mL/min/{1.73_m2} (ref >=60)
EGFR CKD-EPI NON-AA MALE: 55 mL/min/{1.73_m2} — ABNORMAL LOW (ref >=60)
GLUCOSE RANDOM: 91 mg/dL (ref 70–179)
POTASSIUM: 4.4 mmol/L (ref 3.4–4.5)
SODIUM: 137 mmol/L (ref 135–145)

## 2020-01-21 LAB — PHOSPHORUS: Phosphate:MCnc:Pt:Ser/Plas:Qn:: 3.2

## 2020-01-21 LAB — MAGNESIUM: Magnesium:MCnc:Pt:Ser/Plas:Qn:: 2

## 2020-01-21 LAB — SODIUM: Sodium:SCnc:Pt:Ser/Plas:Qn:: 137

## 2020-01-21 LAB — BASOPHILS RELATIVE PERCENT: Basophils/100 leukocytes:NFr:Pt:Bld:Qn:Automated count: 0.8

## 2020-01-25 ENCOUNTER — Ambulatory Visit: Admit: 2020-01-25 | Discharge: 2020-01-26 | Payer: PRIVATE HEALTH INSURANCE

## 2020-01-25 LAB — CBC W/ AUTO DIFF
BASOPHILS ABSOLUTE COUNT: 0 10*9/L (ref 0.0–0.1)
BASOPHILS RELATIVE PERCENT: 0.8 %
EOSINOPHILS ABSOLUTE COUNT: 0.1 10*9/L (ref 0.0–0.7)
EOSINOPHILS RELATIVE PERCENT: 2.2 %
HEMATOCRIT: 33.3 % — ABNORMAL LOW (ref 38.0–50.0)
HEMOGLOBIN: 11.6 g/dL — ABNORMAL LOW (ref 13.5–17.5)
LYMPHOCYTES ABSOLUTE COUNT: 0.6 10*9/L — ABNORMAL LOW (ref 0.7–4.0)
LYMPHOCYTES RELATIVE PERCENT: 20.9 %
MEAN CORPUSCULAR HEMOGLOBIN CONC: 34.8 g/dL (ref 30.0–36.0)
MEAN CORPUSCULAR HEMOGLOBIN: 31.6 pg (ref 26.0–34.0)
MEAN CORPUSCULAR VOLUME: 90.7 fL (ref 81.0–95.0)
MEAN PLATELET VOLUME: 6.8 fL — ABNORMAL LOW (ref 7.0–10.0)
MONOCYTES ABSOLUTE COUNT: 0.4 10*9/L (ref 0.1–1.0)
MONOCYTES RELATIVE PERCENT: 12.1 %
NEUTROPHILS ABSOLUTE COUNT: 1.9 10*9/L (ref 1.7–7.7)
NEUTROPHILS RELATIVE PERCENT: 64 %
NUCLEATED RED BLOOD CELLS: 0 /100{WBCs} (ref <=4)
PLATELET COUNT: 266 10*9/L (ref 150–450)
RED BLOOD CELL COUNT: 3.67 10*12/L — ABNORMAL LOW (ref 4.32–5.72)
RED CELL DISTRIBUTION WIDTH: 15.2 % — ABNORMAL HIGH (ref 12.0–15.0)
WBC ADJUSTED: 3 10*9/L — ABNORMAL LOW (ref 3.5–10.5)

## 2020-01-25 LAB — BASIC METABOLIC PANEL
ANION GAP: 2 mmol/L — ABNORMAL LOW (ref 5–14)
BLOOD UREA NITROGEN: 21 mg/dL (ref 9–23)
BUN / CREAT RATIO: 13
CALCIUM: 9.6 mg/dL (ref 8.7–10.4)
CHLORIDE: 109 mmol/L — ABNORMAL HIGH (ref 98–107)
CO2: 28.9 mmol/L (ref 20.0–31.0)
CREATININE: 1.65 mg/dL — ABNORMAL HIGH
EGFR CKD-EPI AA MALE: 61 mL/min/{1.73_m2} (ref >=60)
EGFR CKD-EPI NON-AA MALE: 53 mL/min/{1.73_m2} — ABNORMAL LOW (ref >=60)
GLUCOSE RANDOM: 91 mg/dL (ref 70–179)
POTASSIUM: 4.2 mmol/L (ref 3.4–4.5)
SODIUM: 140 mmol/L (ref 135–145)

## 2020-01-25 LAB — SLIDE REVIEW

## 2020-01-25 LAB — PHOSPHORUS: PHOSPHORUS: 3.3 mg/dL (ref 2.4–5.1)

## 2020-01-25 LAB — MAGNESIUM: MAGNESIUM: 2 mg/dL (ref 1.6–2.6)

## 2020-02-01 NOTE — Unmapped (Signed)
[facility] Refill Coordination Note    Specialty Medication(s) to be Shipped:   Transplant: Myfortic 180mg  and Prednisone 5mg     Other medication(s) to be shipped: aspirin, magnesium and omeprazole 20mg      Ryan Moon, DOB: January 26, 1986  Phone: [phone] (home)       All above HIPAA information was verified with patient.     Was a Nurse, learning disability used for this call? No    Completed refill call assessment today to schedule patient's medication shipment from the [facility] [phone]).       Specialty medication(s) and dose(s) confirmed: Regimen is correct and unchanged.   Changes to medications: Ryan Moon reports no changes at this time.  Changes to insurance: No  Questions for the pharmacist: No    Confirmed patient received Welcome Packet with first shipment. The patient will receive a drug information handout for each medication shipped and additional FDA Medication Guides as required.       DISEASE/MEDICATION-SPECIFIC INFORMATION        N/A    SPECIALTY MEDICATION ADHERENCE     Medication Adherence    Patient reported X missed doses in the last month: 0  Specialty Medication: Myfortic 180mg   Patient is on additional specialty medications: Yes  Additional Specialty Medications: Prednisone 5mg   Patient Reported Additional Medication X Missed Doses in the Last Month: 0  Patient is on more than two specialty medications: No        Myfortic 180 mg: 5 days of medicine on hand   Prednisone 5 mg: 14 days of medicine on hand     SHIPPING     Shipping address confirmed in Epic.     Delivery Scheduled: Yes, Expected medication delivery date: 02/05/2020.      **Prednisone delivery on 02/15/2020**    Medication will be delivered via Next Day Courier to the prescription address in Epic WAM.    Oretha Milch   [facility] Specialty Technician

## 2020-02-02 ENCOUNTER — Ambulatory Visit: Admit: 2020-02-02 | Discharge: 2020-02-03 | Payer: PRIVATE HEALTH INSURANCE

## 2020-02-02 LAB — CBC W/ AUTO DIFF
BASOPHILS ABSOLUTE COUNT: 0 10*9/L (ref 0.0–0.1)
EOSINOPHILS ABSOLUTE COUNT: 0 10*9/L (ref 0.0–0.7)
EOSINOPHILS RELATIVE PERCENT: 1.8 %
HEMATOCRIT: 36 % — ABNORMAL LOW (ref 38.0–50.0)
HEMOGLOBIN: 12.3 g/dL — ABNORMAL LOW (ref 13.5–17.5)
LYMPHOCYTES ABSOLUTE COUNT: 0.8 10*9/L (ref 0.7–4.0)
LYMPHOCYTES RELATIVE PERCENT: 29 %
MEAN CORPUSCULAR HEMOGLOBIN CONC: 34.2 g/dL (ref 30.0–36.0)
MEAN CORPUSCULAR VOLUME: 90.9 fL (ref 81.0–95.0)
MEAN PLATELET VOLUME: 7.2 fL (ref 7.0–10.0)
MONOCYTES ABSOLUTE COUNT: 0.3 10*9/L (ref 0.1–1.0)
MONOCYTES RELATIVE PERCENT: 10.8 %
NEUTROPHILS ABSOLUTE COUNT: 1.6 10*9/L — ABNORMAL LOW (ref 1.7–7.7)
NEUTROPHILS RELATIVE PERCENT: 57.9 %
NUCLEATED RED BLOOD CELLS: 0 /100{WBCs} (ref <=4)
RED BLOOD CELL COUNT: 3.96 10*12/L — ABNORMAL LOW (ref 4.32–5.72)
RED CELL DISTRIBUTION WIDTH: 15 % (ref 12.0–15.0)
WBC ADJUSTED: 2.7 10*9/L — ABNORMAL LOW (ref 3.5–10.5)

## 2020-02-02 LAB — BASIC METABOLIC PANEL
ANION GAP: 3 mmol/L — ABNORMAL LOW (ref 5–14)
BLOOD UREA NITROGEN: 14 mg/dL (ref 9–23)
BUN / CREAT RATIO: 9
CALCIUM: 9.9 mg/dL (ref 8.7–10.4)
CHLORIDE: 109 mmol/L — ABNORMAL HIGH (ref 98–107)
CO2: 28.3 mmol/L (ref 20.0–31.0)
EGFR CKD-EPI AA MALE: 67 mL/min/{1.73_m2} (ref >=60)
EGFR CKD-EPI NON-AA MALE: 58 mL/min/{1.73_m2} — ABNORMAL LOW (ref >=60)
GLUCOSE RANDOM: 100 mg/dL (ref 70–179)
POTASSIUM: 4.1 mmol/L (ref 3.4–4.5)
SODIUM: 140 mmol/L (ref 135–145)

## 2020-02-02 LAB — GLUCOSE RANDOM: Glucose:MCnc:Pt:Ser/Plas:Qn:: 100

## 2020-02-02 LAB — MAGNESIUM: Magnesium:MCnc:Pt:Ser/Plas:Qn:: 1.8

## 2020-02-02 LAB — PHOSPHORUS: Phosphate:MCnc:Pt:Ser/Plas:Qn:: 2.4

## 2020-02-02 LAB — MEAN CORPUSCULAR VOLUME: Erythrocyte mean corpuscular volume:EntVol:Pt:RBC:Qn:Automated count: 90.9

## 2020-02-04 MED FILL — MG-PLUS-PROTEIN 133 MG TABLET: 100 days supply | Qty: 100 | Fill #2 | Status: AC

## 2020-02-04 MED FILL — MYFORTIC 180 MG TABLET,DELAYED RELEASE: 30 days supply | Qty: 60 | Fill #1 | Status: AC

## 2020-02-04 MED FILL — OMEPRAZOLE 20 MG CAPSULE,DELAYED RELEASE: 30 days supply | Qty: 30 | Fill #0 | Status: AC

## 2020-02-04 MED FILL — ASPIRIN 81 MG TABLET,DELAYED RELEASE: ORAL | 30 days supply | Qty: 30 | Fill #9

## 2020-02-04 MED FILL — OMEPRAZOLE 20 MG CAPSULE,DELAYED RELEASE: ORAL | 30 days supply | Qty: 30 | Fill #0

## 2020-02-04 MED FILL — ASPIRIN 81 MG TABLET,DELAYED RELEASE: 30 days supply | Qty: 30 | Fill #9 | Status: AC

## 2020-02-04 MED FILL — MG-PLUS-PROTEIN 133 MG TABLET: ORAL | 100 days supply | Qty: 100 | Fill #2

## 2020-02-04 MED FILL — MYFORTIC 180 MG TABLET,DELAYED RELEASE: ORAL | 30 days supply | Qty: 60 | Fill #1

## 2020-02-12 ENCOUNTER — Encounter: Admit: 2020-02-12 | Discharge: 2020-02-13 | Payer: PRIVATE HEALTH INSURANCE

## 2020-02-12 LAB — CBC W/ AUTO DIFF
BASOPHILS ABSOLUTE COUNT: 0 10*9/L (ref 0.0–0.1)
BASOPHILS RELATIVE PERCENT: 0.9 %
EOSINOPHILS ABSOLUTE COUNT: 0.1 10*9/L (ref 0.0–0.4)
EOSINOPHILS RELATIVE PERCENT: 4.2 %
HEMATOCRIT: 35.8 % — ABNORMAL LOW (ref 41.0–53.0)
HEMOGLOBIN: 11.8 g/dL — ABNORMAL LOW (ref 13.5–17.5)
LARGE UNSTAINED CELLS: 2 % (ref 0–4)
LYMPHOCYTES RELATIVE PERCENT: 17.8 %
MEAN CORPUSCULAR HEMOGLOBIN CONC: 33 g/dL (ref 31.0–37.0)
MEAN CORPUSCULAR HEMOGLOBIN: 31.5 pg (ref 26.0–34.0)
MEAN CORPUSCULAR VOLUME: 95.6 fL (ref 80.0–100.0)
MEAN PLATELET VOLUME: 8.4 fL (ref 7.0–10.0)
MONOCYTES ABSOLUTE COUNT: 0.3 10*9/L (ref 0.2–0.8)
MONOCYTES RELATIVE PERCENT: 11.6 %
NEUTROPHILS ABSOLUTE COUNT: 1.9 10*9/L — ABNORMAL LOW (ref 2.0–7.5)
NEUTROPHILS RELATIVE PERCENT: 63.9 %
RED BLOOD CELL COUNT: 3.74 10*12/L — ABNORMAL LOW (ref 4.50–5.90)
RED CELL DISTRIBUTION WIDTH: 14.8 % (ref 12.0–15.0)
WBC ADJUSTED: 2.9 10*9/L — ABNORMAL LOW (ref 4.5–11.0)

## 2020-02-12 LAB — BASIC METABOLIC PANEL
ANION GAP: 5 mmol/L (ref 5–14)
BLOOD UREA NITROGEN: 20 mg/dL (ref 9–23)
CALCIUM: 9.5 mg/dL (ref 8.7–10.4)
CHLORIDE: 106 mmol/L (ref 98–107)
CO2: 27 mmol/L (ref 20.0–31.0)
CREATININE: 1.49 mg/dL — ABNORMAL HIGH
EGFR CKD-EPI AA MALE: 69 mL/min/{1.73_m2} (ref >=60)
EGFR CKD-EPI NON-AA MALE: 60 mL/min/{1.73_m2} (ref >=60)
GLUCOSE RANDOM: 82 mg/dL (ref 70–99)
SODIUM: 138 mmol/L (ref 135–145)

## 2020-02-12 LAB — SLIDE REVIEW

## 2020-02-12 LAB — GLUCOSE RANDOM: Glucose:MCnc:Pt:Ser/Plas:Qn:: 82

## 2020-02-12 LAB — MAGNESIUM: Magnesium:MCnc:Pt:Ser/Plas:Qn:: 1.8

## 2020-02-12 LAB — MEAN CORPUSCULAR VOLUME: Erythrocyte mean corpuscular volume:EntVol:Pt:RBC:Qn:Automated count: 95.6

## 2020-02-12 LAB — PHOSPHORUS: Phosphate:MCnc:Pt:Ser/Plas:Qn:: 3.4

## 2020-02-12 LAB — DOHLE BODIES

## 2020-02-12 MED ADMIN — belatacept (NULOJIX) 375 mg in sodium chloride (NS) 0.9 % 100 mL IVPB: 5 mg/kg | INTRAVENOUS | @ 15:00:00 | Stop: 2020-02-12

## 2020-02-12 MED FILL — PREDNISONE 5 MG TABLET: 30 days supply | Qty: 30 | Fill #8 | Status: AC

## 2020-02-12 MED FILL — PREDNISONE 5 MG TABLET: ORAL | 30 days supply | Qty: 30 | Fill #8

## 2020-02-12 NOTE — Unmapped (Signed)
1020 Patient arrived Transplant Infusion Room today for belatacept 375mg , Condition: well; Mobility: ambulating; accompanied by self.   See Flowsheet and MAR for all details of visit.   1024 VS stable.  1034 PIV placed and secured with coban, NS KVO; labs collected and sent, urine no orders found.  1037 Infusion initiated.  1110 Infusion complete.   1116 VS stable, Line flushed with NS, PIV removed and secured with coban, pt left clinic, Condition: well; Mobility: ambulating; accompanied by self.

## 2020-02-16 ENCOUNTER — Encounter: Admit: 2020-02-16 | Discharge: 2020-02-17 | Payer: PRIVATE HEALTH INSURANCE

## 2020-02-16 LAB — CBC W/ AUTO DIFF
BASOPHILS ABSOLUTE COUNT: 0 10*9/L (ref 0.0–0.1)
BASOPHILS RELATIVE PERCENT: 0.8 %
EOSINOPHILS ABSOLUTE COUNT: 0.1 10*9/L (ref 0.0–0.7)
EOSINOPHILS RELATIVE PERCENT: 4.2 %
HEMATOCRIT: 35.1 % — ABNORMAL LOW (ref 38.0–50.0)
HEMOGLOBIN: 11.9 g/dL — ABNORMAL LOW (ref 13.5–17.5)
LYMPHOCYTES ABSOLUTE COUNT: 1 10*9/L (ref 0.7–4.0)
MEAN CORPUSCULAR HEMOGLOBIN CONC: 33.8 g/dL (ref 30.0–36.0)
MEAN CORPUSCULAR HEMOGLOBIN: 30.8 pg (ref 26.0–34.0)
MEAN CORPUSCULAR VOLUME: 90.9 fL (ref 81.0–95.0)
MEAN PLATELET VOLUME: 7.1 fL (ref 7.0–10.0)
MONOCYTES ABSOLUTE COUNT: 0.4 10*9/L (ref 0.1–1.0)
MONOCYTES RELATIVE PERCENT: 14.3 %
NEUTROPHILS ABSOLUTE COUNT: 1.3 10*9/L — ABNORMAL LOW (ref 1.7–7.7)
NEUTROPHILS RELATIVE PERCENT: 46.5 %
NUCLEATED RED BLOOD CELLS: 0 /100{WBCs} (ref <=4)
PLATELET COUNT: 206 10*9/L (ref 150–450)
RED BLOOD CELL COUNT: 3.86 10*12/L — ABNORMAL LOW (ref 4.32–5.72)
WBC ADJUSTED: 2.9 10*9/L — ABNORMAL LOW (ref 3.5–10.5)

## 2020-02-16 LAB — POTASSIUM: Potassium:SCnc:Pt:Ser/Plas:Qn:: 4.2

## 2020-02-16 LAB — BASIC METABOLIC PANEL
ANION GAP: 2 mmol/L — ABNORMAL LOW (ref 5–14)
BLOOD UREA NITROGEN: 24 mg/dL — ABNORMAL HIGH (ref 9–23)
BUN / CREAT RATIO: 16
CALCIUM: 9.7 mg/dL (ref 8.7–10.4)
CHLORIDE: 109 mmol/L — ABNORMAL HIGH (ref 98–107)
CO2: 28.7 mmol/L (ref 20.0–31.0)
CREATININE: 1.52 mg/dL — ABNORMAL HIGH
EGFR CKD-EPI AA MALE: 67 mL/min/{1.73_m2} (ref >=60)
EGFR CKD-EPI NON-AA MALE: 58 mL/min/{1.73_m2} — ABNORMAL LOW (ref >=60)
GLUCOSE RANDOM: 89 mg/dL (ref 70–179)
SODIUM: 140 mmol/L (ref 135–145)

## 2020-02-16 LAB — PHOSPHORUS: Phosphate:MCnc:Pt:Ser/Plas:Qn:: 4.4

## 2020-02-16 LAB — MAGNESIUM: Magnesium:MCnc:Pt:Ser/Plas:Qn:: 1.8

## 2020-02-16 LAB — MEAN CORPUSCULAR VOLUME: Erythrocyte mean corpuscular volume:EntVol:Pt:RBC:Qn:Automated count: 90.9

## 2020-02-23 DIAGNOSIS — Z79899 Other long term (current) drug therapy: Principal | ICD-10-CM

## 2020-02-23 DIAGNOSIS — Z940 Kidney transplant status: Principal | ICD-10-CM

## 2020-02-24 ENCOUNTER — Ambulatory Visit: Admit: 2020-02-24 | Discharge: 2020-02-25 | Payer: PRIVATE HEALTH INSURANCE

## 2020-02-24 LAB — COMPREHENSIVE METABOLIC PANEL
ALBUMIN: 4.2 g/dL (ref 3.4–5.0)
ALKALINE PHOSPHATASE: 84 U/L (ref 46–116)
ALT (SGPT): 19 U/L (ref 10–49)
ANION GAP: 6 mmol/L (ref 5–14)
AST (SGOT): 18 U/L (ref <=34)
BILIRUBIN TOTAL: 0.3 mg/dL (ref 0.3–1.2)
BLOOD UREA NITROGEN: 20 mg/dL (ref 9–23)
BUN / CREAT RATIO: 13
CALCIUM: 9.9 mg/dL (ref 8.7–10.4)
CO2: 27.8 mmol/L (ref 20.0–31.0)
CREATININE: 1.55 mg/dL — ABNORMAL HIGH
EGFR CKD-EPI AA MALE: 66 mL/min/{1.73_m2} (ref >=60)
EGFR CKD-EPI NON-AA MALE: 57 mL/min/{1.73_m2} — ABNORMAL LOW (ref >=60)
POTASSIUM: 3.8 mmol/L (ref 3.4–4.5)
PROTEIN TOTAL: 7.6 g/dL (ref 5.7–8.2)
SODIUM: 136 mmol/L (ref 135–145)

## 2020-02-24 LAB — URINALYSIS
BILIRUBIN UA: NEGATIVE
GLUCOSE UA: NEGATIVE
KETONES UA: NEGATIVE
NITRITE UA: NEGATIVE
PH UA: 5 (ref 5.0–9.0)
PROTEIN UA: NEGATIVE
RBC UA: 2 /HPF (ref <3)
SPECIFIC GRAVITY UA: 1.01 (ref 1.005–1.040)
UROBILINOGEN UA: 0.2
WBC UA: <1 /HPF (ref <2)

## 2020-02-24 LAB — CBC W/ AUTO DIFF
BASOPHILS ABSOLUTE COUNT: 0 10*9/L (ref 0.0–0.1)
BASOPHILS RELATIVE PERCENT: 0.4 %
EOSINOPHILS ABSOLUTE COUNT: 0.1 10*9/L (ref 0.0–0.7)
HEMATOCRIT: 36 % — ABNORMAL LOW (ref 38.0–50.0)
HEMOGLOBIN: 12.3 g/dL — ABNORMAL LOW (ref 13.5–17.5)
LYMPHOCYTES ABSOLUTE COUNT: 0.8 10*9/L (ref 0.7–4.0)
MEAN CORPUSCULAR HEMOGLOBIN CONC: 34.2 g/dL (ref 30.0–36.0)
MEAN CORPUSCULAR HEMOGLOBIN: 31.3 pg (ref 26.0–34.0)
MEAN PLATELET VOLUME: 7.2 fL (ref 7.0–10.0)
MONOCYTES ABSOLUTE COUNT: 0.4 10*9/L (ref 0.1–1.0)
MONOCYTES RELATIVE PERCENT: 10.5 %
NEUTROPHILS ABSOLUTE COUNT: 2.5 10*9/L (ref 1.7–7.7)
NEUTROPHILS RELATIVE PERCENT: 65.2 %
NUCLEATED RED BLOOD CELLS: 0 /100{WBCs} (ref <=4)
PLATELET COUNT: 238 10*9/L (ref 150–450)
RED CELL DISTRIBUTION WIDTH: 14.8 % (ref 12.0–15.0)
WBC ADJUSTED: 3.9 10*9/L (ref 3.5–10.5)

## 2020-02-24 LAB — MEAN CORPUSCULAR VOLUME: Erythrocyte mean corpuscular volume:EntVol:Pt:RBC:Qn:Automated count: 91.4

## 2020-02-24 LAB — PROTEIN UA: Protein:MCnc:Pt:Urine:Qn:Test strip: NEGATIVE

## 2020-02-24 LAB — MAGNESIUM: Magnesium:MCnc:Pt:Ser/Plas:Qn:: 2

## 2020-02-24 LAB — CREATININE, URINE: Lab: 53

## 2020-02-24 LAB — PROTEIN / CREATININE RATIO, URINE: CREATININE, URINE: 53 mg/dL

## 2020-02-24 LAB — EGFR CKD-EPI AA MALE: Glomerular filtration rate/1.73 sq M.predicted.black:ArVRat:Pt:Ser/Plas/Bld:Qn:Creatinine-based formula (CKD-EPI): 66

## 2020-02-24 LAB — PHOSPHORUS: Phosphate:MCnc:Pt:Ser/Plas:Qn:: 2.3 — ABNORMAL LOW

## 2020-02-25 ENCOUNTER — Ambulatory Visit
Admit: 2020-02-25 | Discharge: 2020-02-26 | Payer: PRIVATE HEALTH INSURANCE | Attending: Nephrology | Primary: Nephrology

## 2020-02-25 DIAGNOSIS — Z79899 Other long term (current) drug therapy: Principal | ICD-10-CM

## 2020-02-25 DIAGNOSIS — Z940 Kidney transplant status: Principal | ICD-10-CM

## 2020-02-25 LAB — URINALYSIS
BILIRUBIN UA: NEGATIVE
GLUCOSE UA: NEGATIVE
KETONES UA: NEGATIVE
LEUKOCYTE ESTERASE UA: NEGATIVE
NITRITE UA: NEGATIVE
PH UA: 6 (ref 5.0–9.0)
PROTEIN UA: NEGATIVE
RBC UA: <1 /HPF (ref <3)
SPECIFIC GRAVITY UA: 1.015 (ref 1.005–1.030)
SQUAMOUS EPITHELIAL: <1 /HPF (ref 0–5)
WBC UA: <1 /HPF (ref <2)

## 2020-02-25 LAB — BACTERIA

## 2020-02-25 LAB — CREATININE, URINE: Lab: 88.9

## 2020-02-25 LAB — PROTEIN / CREATININE RATIO, URINE
CREATININE, URINE: 88.9 mg/dL
PROTEIN/CREAT RATIO, URINE: 0.396

## 2020-02-25 NOTE — Unmapped (Signed)
Transplant Coordinator, Clinic Visit   Pt seen today by transplant nephrology for follow up, reviewed medications and symptoms.          02/25/20 1228   BP: 112/66   Pulse: 71   Temp: 36.5 ??C (97.7 ??F)   Weight: 77.6 kg (171 lb)   Height: 182.9 cm (6' 0.01)   PainSc: 0-No pain       Assessment  Headache: denies  Hand tremors: denies  Numbness/tingling: denies  Fevers: denies  Chills/sweats: denies  Shortness of breath: denies  Chest pain or pressure: denies  Palpitations: denies  Nausea/vomiting: denies  Diarrhea/constipation: denies  UTI symptoms: denies  Swelling: denies    Good appetite; reports adequate hydration.     Any new medications? no  Immunosuppressant last taken: receives monthly Belatacept infusions    Immunization status: pt not interested in receiving COVID-19 vaccine or Flu vaccine    Functional Score: 90     Able to carry on normal activity;  Minor signs or symptoms of disease.  Employment status is: works full time    I spent a total of 10 minutes with Ryan Moon reviewing medications and symptoms.

## 2020-02-25 NOTE — Unmapped (Signed)
AOBP: Right arm  Medium cuff   Average: 112/66 Pulse: 71  1st reading: 107/66 Pulse: 71   2nd reading: 115/64 Pulse: 72  3rd reading:115/67 Pulse: 70

## 2020-02-25 NOTE — Unmapped (Signed)
[facility] Refill Coordination Note    Specialty Medication(s) to be Shipped:   Transplant: Myfortic 180mg     Other medication(s) to be shipped: amlodipine   Omeprazole  aspirin     [name], DOB: 30-Mar-1986  Phone: [phone] (home)       All above HIPAA information was verified with patient.     Was a Nurse, learning disability used for this call? No    Completed refill call assessment today to schedule patient's medication shipment from the [facility] [phone]).       Specialty medication(s) and dose(s) confirmed: Regimen is correct and unchanged.   Changes to medications: Hudson reports no changes at this time.  Changes to insurance: No  Questions for the pharmacist: No    Confirmed patient received Welcome Packet with first shipment. The patient will receive a drug information handout for each medication shipped and additional FDA Medication Guides as required.       DISEASE/MEDICATION-SPECIFIC INFORMATION        N/A    SPECIALTY MEDICATION ADHERENCE     Medication Adherence    Patient reported X missed doses in the last month: 0                Myfrotic 180mg : 10 days worth of medication on hand.        SHIPPING     Shipping address confirmed in Epic.     Delivery Scheduled: Yes, Expected medication delivery date: 03/02/20.     Medication will be delivered via UPS to the prescription address in Epic [place].    [place] A Alveria Mcglaughlin   [facility] Specialty Technician

## 2020-02-26 LAB — CMV DNA, QUANTITATIVE, PCR

## 2020-02-26 LAB — CMV COMMENT: Lab: 0

## 2020-03-01 NOTE — Unmapped (Signed)
Ryan Moon 's ENTIRE shipment will be delayed as a result of the medication is too soon to refill until 10/1.     I have reached out to the patient and left a voicemail message.  We will wait for a call back from the patient to reschedule the delivery.  We have not confirmed the new delivery date.

## 2020-03-01 NOTE — Unmapped (Signed)
left message, called to move 12/15 appt with Dr. Toni Arthurs to earlier per patient request via Nicole's 9/28 inbasket

## 2020-03-02 ENCOUNTER — Non-Acute Institutional Stay: Admit: 2020-03-02 | Discharge: 2020-03-03 | Payer: PRIVATE HEALTH INSURANCE

## 2020-03-02 LAB — PHOSPHORUS: PHOSPHORUS: 3.5 mg/dL (ref 2.4–5.1)

## 2020-03-02 LAB — CBC W/ AUTO DIFF
BASOPHILS ABSOLUTE COUNT: 0 10*9/L (ref 0.0–0.1)
BASOPHILS RELATIVE PERCENT: 0.4 %
EOSINOPHILS ABSOLUTE COUNT: 0.1 10*9/L (ref 0.0–0.7)
EOSINOPHILS RELATIVE PERCENT: 1.4 %
HEMATOCRIT: 36 % — ABNORMAL LOW (ref 38.0–50.0)
HEMOGLOBIN: 12.3 g/dL — ABNORMAL LOW (ref 13.5–17.5)
LYMPHOCYTES ABSOLUTE COUNT: 0.7 10*9/L (ref 0.7–4.0)
LYMPHOCYTES RELATIVE PERCENT: 18.4 %
MEAN CORPUSCULAR HEMOGLOBIN CONC: 34.1 g/dL (ref 30.0–36.0)
MEAN CORPUSCULAR HEMOGLOBIN: 31.1 pg (ref 26.0–34.0)
MEAN CORPUSCULAR VOLUME: 91.3 fL (ref 81.0–95.0)
MEAN PLATELET VOLUME: 6.9 fL — ABNORMAL LOW (ref 7.0–10.0)
MONOCYTES ABSOLUTE COUNT: 0.4 10*9/L (ref 0.1–1.0)
MONOCYTES RELATIVE PERCENT: 10.3 %
NEUTROPHILS ABSOLUTE COUNT: 2.5 10*9/L (ref 1.7–7.7)
NEUTROPHILS RELATIVE PERCENT: 69.5 %
NUCLEATED RED BLOOD CELLS: 0 /100{WBCs} (ref <=4)
PLATELET COUNT: 252 10*9/L (ref 150–450)
RED BLOOD CELL COUNT: 3.94 10*12/L — ABNORMAL LOW (ref 4.32–5.72)
RED CELL DISTRIBUTION WIDTH: 14.6 % (ref 12.0–15.0)
WBC ADJUSTED: 3.6 10*9/L (ref 3.5–10.5)

## 2020-03-02 LAB — SLIDE REVIEW

## 2020-03-02 LAB — BASIC METABOLIC PANEL
ANION GAP: 6 mmol/L (ref 5–14)
BLOOD UREA NITROGEN: 17 mg/dL (ref 9–23)
BUN / CREAT RATIO: 10
CALCIUM: 9.9 mg/dL (ref 8.7–10.4)
CHLORIDE: 103 mmol/L (ref 98–107)
CO2: 27 mmol/L (ref 20.0–31.0)
CREATININE: 1.63 mg/dL — ABNORMAL HIGH
EGFR CKD-EPI AA MALE: 62 mL/min/{1.73_m2} (ref >=60)
EGFR CKD-EPI NON-AA MALE: 53 mL/min/{1.73_m2} — ABNORMAL LOW (ref >=60)
GLUCOSE RANDOM: 103 mg/dL (ref 70–179)
POTASSIUM: 3.9 mmol/L (ref 3.4–4.5)
SODIUM: 136 mmol/L (ref 135–145)

## 2020-03-02 LAB — MAGNESIUM: MAGNESIUM: 1.9 mg/dL (ref 1.6–2.6)

## 2020-03-03 NOTE — Unmapped (Signed)
Ryan Moon 's Myfortic shipment will be sent out  as a result of patient approved new delivery date.    I have reached out to the patient and communicated the delivery change. We will reschedule the medication for the delivery date that the patient agreed upon.  We have confirmed the delivery date as 10/01, via same day courier.

## 2020-03-03 NOTE — Unmapped (Signed)
[facility] transplant nephrology clinic visit    assessment and plan  1. s/p kidney transplant 04/28/2019. baseline creatinine 1.5-1.8 mg/dl. no proteinuria. no donor specific hla ab detected.   2. immunosuppression. belatacept 5mg /kg q.month. prednisone 5mg  daily. mycophenolate 180mg  bid d/t leukopenia.  3. hypertension. blood pressure goal <130/80 mmhg.  4. preventive medicine. influenza '20. pcv13 pnuemococcal '19. ppsv23 pneumococcal '19. covid-19 vaccine strongly recommended.    history of present illness    Ryan Moon is a 34 year old gentleman seen in follow up post kidney transplant 04/28/2019. he feels well. no fever chills or sweats. no headache or lightheaded. no chest pain cough or shortness of breath. no lower extremity edema. appetite nl. no abdominal pain no n/v/d. no myalgias or arthralgias. no dysuria hematuria or difficulty voiding. all other systems reviewed and negative x10 systems.    past medical hx:  1. 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 baseline creatinine 1.5-1.8 mg/dl.   > kidney bx 01/21: no acute/chr rejection.  2. hypertension  3. anxiety/depression    allergies: nkda    medications: belatacept 5mg /kg q.month, mycophenolate 180mg  bid, prednisone 5mg  daily, aspirin 81mg  daily, amlodipine 10mg  daily, losartan 25mg  bid, mg oxide 133mg  daily, omeprazole 20mg  daily, escitalopram 10mg  daily, buspirone 15mg  bid, alprazolam 0.5mg  prn.    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 wd/wn gentleman appropriate affect and mood. sclera anicteric. wearing mask. neck supple no palpable ln. heart rrr nl s1s2 no m/r/g. lungs clear bilateral. abd soft nt/nd. no lower ext edema. msk no synovitis/tophi. skin no rash. neuro alert oriented non focal exam.    labs 02/24/20: wbc3.9 hgb12.3 hct36 plts238. na136 k3.8 cl102 bicarb28 bun20 cr1.5 glc104 ca9.9 mg2 phos2.3 albumin4.2 liver function panel nl. cmv pcr not detected. urinalysis 6/1.010 no protein no blood. urine protein/cr 0.323.

## 2020-03-04 LAB — HLA DS POST TRANSPLANT
ANTI-DONOR DRW #1 MFI: 319 MFI
ANTI-DONOR DRW #2 MFI: 142 MFI
ANTI-DONOR HLA-A #1 MFI: 20 MFI
ANTI-DONOR HLA-A #2 MFI: 96 MFI
ANTI-DONOR HLA-B #1 MFI: 0 MFI
ANTI-DONOR HLA-B #2 MFI: 34 MFI
ANTI-DONOR HLA-C #1 MFI: 0 MFI
ANTI-DONOR HLA-C #2 MFI: 0 MFI
ANTI-DONOR HLA-DP AG #1 MFI: 122 MFI
ANTI-DONOR HLA-DQB #1 MFI: 138 MFI
ANTI-DONOR HLA-DQB #2 MFI: 138 MFI
ANTI-DONOR HLA-DR #1 MFI: 61 MFI
ANTI-DONOR HLA-DR #2 MFI: 257 MFI

## 2020-03-04 LAB — DSA COMMENT: Lab: 0

## 2020-03-04 LAB — HLA CLASS 1 ANTIBODY RESULT: Lab: NEGATIVE

## 2020-03-04 LAB — HLA CL2 AB COMMENT: Lab: 0

## 2020-03-04 MED FILL — AMLODIPINE 10 MG TABLET: ORAL | 90 days supply | Qty: 90 | Fill #2

## 2020-03-04 MED FILL — OMEPRAZOLE 20 MG CAPSULE,DELAYED RELEASE: ORAL | 30 days supply | Qty: 30 | Fill #1

## 2020-03-04 MED FILL — ASPIRIN 81 MG TABLET,DELAYED RELEASE: ORAL | 30 days supply | Qty: 30 | Fill #10

## 2020-03-04 MED FILL — AMLODIPINE 10 MG TABLET: 90 days supply | Qty: 90 | Fill #2 | Status: AC

## 2020-03-04 MED FILL — OMEPRAZOLE 20 MG CAPSULE,DELAYED RELEASE: 30 days supply | Qty: 30 | Fill #1 | Status: AC

## 2020-03-04 MED FILL — ASPIRIN 81 MG TABLET,DELAYED RELEASE: 30 days supply | Qty: 30 | Fill #10 | Status: AC

## 2020-03-04 MED FILL — MYFORTIC 180 MG TABLET,DELAYED RELEASE: 30 days supply | Qty: 60 | Fill #2 | Status: AC

## 2020-03-04 MED FILL — MYFORTIC 180 MG TABLET,DELAYED RELEASE: ORAL | 30 days supply | Qty: 60 | Fill #2

## 2020-03-11 ENCOUNTER — Encounter: Admit: 2020-03-11 | Discharge: 2020-03-12 | Payer: PRIVATE HEALTH INSURANCE

## 2020-03-11 LAB — BASIC METABOLIC PANEL
ANION GAP: 7 mmol/L (ref 5–14)
BLOOD UREA NITROGEN: 19 mg/dL (ref 9–23)
CALCIUM: 9.8 mg/dL (ref 8.7–10.4)
CHLORIDE: 106 mmol/L (ref 98–107)
CO2: 28.1 mmol/L (ref 20.0–31.0)
CREATININE: 1.6 mg/dL — ABNORMAL HIGH
EGFR CKD-EPI AA MALE: 63 mL/min/{1.73_m2} (ref >=60)
EGFR CKD-EPI NON-AA MALE: 55 mL/min/{1.73_m2} — ABNORMAL LOW (ref >=60)
GLUCOSE RANDOM: 84 mg/dL (ref 70–179)
SODIUM: 141 mmol/L (ref 135–145)

## 2020-03-11 LAB — CBC W/ AUTO DIFF
BASOPHILS ABSOLUTE COUNT: 0 10*9/L (ref 0.0–0.1)
BASOPHILS RELATIVE PERCENT: 0.5 %
EOSINOPHILS RELATIVE PERCENT: 4.5 %
HEMOGLOBIN: 12.1 g/dL — ABNORMAL LOW (ref 13.5–17.5)
LYMPHOCYTES ABSOLUTE COUNT: 1.1 10*9/L (ref 0.7–4.0)
LYMPHOCYTES RELATIVE PERCENT: 34.5 %
MEAN CORPUSCULAR HEMOGLOBIN CONC: 34.4 g/dL (ref 30.0–36.0)
MEAN CORPUSCULAR HEMOGLOBIN: 31 pg (ref 26.0–34.0)
MEAN CORPUSCULAR VOLUME: 90 fL (ref 81.0–95.0)
MEAN PLATELET VOLUME: 7.1 fL (ref 7.0–10.0)
MONOCYTES ABSOLUTE COUNT: 0.4 10*9/L (ref 0.1–1.0)
MONOCYTES RELATIVE PERCENT: 13.4 %
NEUTROPHILS ABSOLUTE COUNT: 1.5 10*9/L — ABNORMAL LOW (ref 1.7–7.7)
NUCLEATED RED BLOOD CELLS: 0 /100{WBCs} (ref <=4)
PLATELET COUNT: 243 10*9/L (ref 150–450)
RED BLOOD CELL COUNT: 3.91 10*12/L — ABNORMAL LOW (ref 4.32–5.72)
RED CELL DISTRIBUTION WIDTH: 14.4 % (ref 12.0–15.0)
WBC ADJUSTED: 3.1 10*9/L — ABNORMAL LOW (ref 3.5–10.5)

## 2020-03-11 LAB — SMEAR REVIEW

## 2020-03-11 LAB — MAGNESIUM: Magnesium:MCnc:Pt:Ser/Plas:Qn:: 2

## 2020-03-11 LAB — ANION GAP: Anion gap 3:SCnc:Pt:Ser/Plas:Qn:: 7

## 2020-03-11 LAB — PHOSPHORUS: Phosphate:MCnc:Pt:Ser/Plas:Qn:: 4

## 2020-03-11 LAB — NEUTROPHILS ABSOLUTE COUNT: Neutrophils:NCnc:Pt:Bld:Qn:Automated count: 1.5 — ABNORMAL LOW

## 2020-03-17 ENCOUNTER — Encounter: Admit: 2020-03-17 | Discharge: 2020-03-18 | Payer: PRIVATE HEALTH INSURANCE

## 2020-03-17 LAB — CBC W/ AUTO DIFF
BASOPHILS ABSOLUTE COUNT: 0 10*9/L (ref 0.0–0.1)
BASOPHILS RELATIVE PERCENT: 0.2 %
EOSINOPHILS ABSOLUTE COUNT: 0.1 10*9/L (ref 0.0–0.4)
EOSINOPHILS RELATIVE PERCENT: 2.1 %
HEMATOCRIT: 37.4 % — ABNORMAL LOW (ref 41.0–53.0)
HEMOGLOBIN: 12.3 g/dL — ABNORMAL LOW (ref 13.5–17.5)
LARGE UNSTAINED CELLS: 3 % (ref 0–4)
LYMPHOCYTES ABSOLUTE COUNT: 0.7 10*9/L — ABNORMAL LOW (ref 1.5–5.0)
LYMPHOCYTES RELATIVE PERCENT: 20 %
MEAN CORPUSCULAR HEMOGLOBIN CONC: 33 g/dL (ref 31.0–37.0)
MEAN CORPUSCULAR HEMOGLOBIN: 30.8 pg (ref 26.0–34.0)
MEAN CORPUSCULAR VOLUME: 93.4 fL (ref 80.0–100.0)
MEAN PLATELET VOLUME: 7.9 fL (ref 7.0–10.0)
MONOCYTES ABSOLUTE COUNT: 0.2 10*9/L (ref 0.2–0.8)
MONOCYTES RELATIVE PERCENT: 6.7 %
NEUTROPHILS ABSOLUTE COUNT: 2.2 10*9/L (ref 2.0–7.5)
RED BLOOD CELL COUNT: 4 10*12/L — ABNORMAL LOW (ref 4.50–5.90)

## 2020-03-17 LAB — MAGNESIUM: Magnesium:MCnc:Pt:Ser/Plas:Qn:: 1.7

## 2020-03-17 LAB — BASIC METABOLIC PANEL
ANION GAP: 6 mmol/L (ref 5–14)
BLOOD UREA NITROGEN: 18 mg/dL (ref 9–23)
BUN / CREAT RATIO: 12
CALCIUM: 9.4 mg/dL (ref 8.7–10.4)
CHLORIDE: 103 mmol/L (ref 98–107)
CREATININE: 1.54 mg/dL — ABNORMAL HIGH
EGFR CKD-EPI AA MALE: 66 mL/min/{1.73_m2} (ref >=60)
GLUCOSE RANDOM: 94 mg/dL (ref 70–99)
POTASSIUM: 3.8 mmol/L (ref 3.4–4.5)
SODIUM: 136 mmol/L (ref 135–145)

## 2020-03-17 LAB — SLIDE REVIEW

## 2020-03-17 LAB — CREATININE: Creatinine:MCnc:Pt:Ser/Plas:Qn:: 1.54 — ABNORMAL HIGH

## 2020-03-17 LAB — DOHLE BODIES

## 2020-03-17 LAB — PHOSPHORUS: Phosphate:MCnc:Pt:Ser/Plas:Qn:: 2.7

## 2020-03-17 LAB — PLATELET COUNT: Platelets:NCnc:Pt:Bld:Qn:Automated count: 267

## 2020-03-17 MED ADMIN — belatacept (NULOJIX) 387.5 mg in sodium chloride (NS) 0.9 % 100 mL IVPB: 5 mg/kg | INTRAVENOUS | @ 15:00:00 | Stop: 2020-03-17

## 2020-03-17 NOTE — Unmapped (Signed)
1055 Patient arrived Transplant Infusion Room today for belatacept 387.5mg  , Condition: well; Mobility: ambulating; accompanied by self.   See Flowsheet and MAR for all details of visit.   1056 VS stable.  1107  PIV placed and secured with coban, NS KVO; labs collected and sent, urine no orders found.  1111 Infusion initiated.  1145 Infusion complete.   1200 VS stable, Line flushed with NS, PIV removed and secured with coban, pt left clinic, Condition: well; Mobility: ambulating; accompanied by self.

## 2020-03-18 MED FILL — PREDNISONE 5 MG TABLET: 30 days supply | Qty: 30 | Fill #9 | Status: AC

## 2020-03-18 MED FILL — PREDNISONE 5 MG TABLET: ORAL | 30 days supply | Qty: 30 | Fill #9

## 2020-03-18 NOTE — Unmapped (Signed)
[facility] Clinical Assessment & Refill Coordination Note    Ryan Moon, DOB: 09/11/1985  Phone: [phone] (home)     All above HIPAA information was verified with patient.     Was a Nurse, learning disability used for this call? No    Specialty Medication(s):   Transplant: Myfortic 180mg  and Prednisone 5mg      Current Outpatient Medications   Medication Sig Dispense Refill   ??? ALPRAZolam (XANAX) 0.5 MG tablet Take 0.5 mg by mouth two (2) times a day as needed.     ??? amLODIPine (NORVASC) 10 MG tablet Take 1 tablet (10 mg total) by mouth daily. 90 tablet 3   ??? aspirin (ECOTRIN) 81 MG tablet Take 1 tablet (81 mg total) by mouth daily. 30 tablet 11   ??? belatacept (NULOJIX) 250 mg SolR Belatacept  5 mg/kg every 2 weeks for 5 doses then once every 4 weeks 14.5 mL 0   ??? busPIRone (BUSPAR) 15 MG tablet Take 15 mg by mouth two (2) times a day.     ??? escitalopram oxalate (LEXAPRO) 10 MG tablet Take 10 mg by mouth every morning.      ??? losartan (COZAAR) 25 MG tablet Take 1 tablet (25 mg total) by mouth Two (2) times a day. 60 tablet 11   ??? magnesium oxide-Mg AA chelate (MAGNESIUM, AMINO ACID CHELATE,) 133 mg Tab Take 1 tablet by mouth daily. 100 tablet 11   ??? mycophenolate (MYFORTIC) 180 MG EC tablet Take 1 tablet (180 mg total) by mouth Two (2) times a day. 60 tablet 11   ??? omeprazole (PRILOSEC) 20 MG capsule Take 1 capsule (20 mg total) by mouth daily. 30 capsule 5   ??? predniSONE (DELTASONE) 5 MG tablet Take 1 tablet (5 mg total) by mouth daily. 30 tablet 11     No current facility-administered medications for this visit.        Changes to medications: Qadir reports no changes at this time.    Allergies   Allergen Reactions   ??? Acetaminophen Nausea And Vomiting       Changes to allergies: No    SPECIALTY MEDICATION ADHERENCE     Myfortic 180mg   : 18 days of medicine on hand   Prednisone 5mg   : 4 days of medicine on hand       Medication Adherence    Patient reported X missed doses in the last month: 0  Specialty Medication: prednisone 5mg   Patient is on additional specialty medications: Yes  Additional Specialty Medications: Myfortic 180mg   Patient Reported Additional Medication X Missed Doses in the Last Month: 0          Specialty medication(s) dose(s) confirmed: Regimen is correct and unchanged.     Are there any concerns with adherence? No    Adherence counseling provided? Not needed    CLINICAL MANAGEMENT AND INTERVENTION      Clinical Benefit Assessment:    Do you feel the medicine is effective or helping your condition? Yes    Clinical Benefit counseling provided? Not needed    Adverse Effects Assessment:    Are you experiencing any side effects? No    Are you experiencing difficulty administering your medicine? No    Quality of Life Assessment:    How many days over the past month did your transplant  keep you from your normal activities? For example, brushing your teeth or getting up in the morning. 0    Have you discussed this  with your provider? Not needed    Therapy Appropriateness:    Is therapy appropriate? Yes, therapy is appropriate and should be continued    DISEASE/MEDICATION-SPECIFIC INFORMATION      N/A    PATIENT SPECIFIC NEEDS     - Does the patient have any physical, cognitive, or cultural barriers? No    - Is the patient high risk? Yes, patient is taking a REMS drug. Medication is dispensed in compliance with REMS program    - Does the patient require a Care Management Plan? No     - Does the patient require physician intervention or other additional services (i.e. nutrition, smoking cessation, social work)? No      SHIPPING     Specialty Medication(s) to be Shipped:   Transplant: Prednisone 5mg     Other medication(s) to be shipped: No additional medications requested for fill at this time   Pt wants call back in 1 week     Changes to insurance: No    Delivery Scheduled: Yes, Expected medication delivery date: 03/21/2020.     Medication will be delivered via UPS to the confirmed prescription address in [facility].    The patient will receive a drug information handout for each medication shipped and additional FDA Medication Guides as required.  Verified that patient has previously received a Conservation officer, historic buildings.    All of the patient's questions and concerns have been addressed.    [name]   [facility] Specialty Pharmacist

## 2020-03-24 NOTE — Unmapped (Signed)
[facility] Refill Coordination Note    Specialty Medication(s) to be Shipped:   Transplant: Myfortic 180mg     Other medication(s) to be shipped: No additional medications requested for fill at this time     Ryan Moon, DOB: [birth year]  Phone: [phone] (home)       All above HIPAA information was verified with patient.     Was a Nurse, learning disability used for this call? No    Completed refill call assessment today to schedule patient's medication shipment from the [facility] [phone]).       Specialty medication(s) and dose(s) confirmed: Regimen is correct and unchanged.   Changes to medications: [name] reports no changes at this time.  Changes to insurance: No  Questions for the pharmacist: No    Confirmed patient received Welcome Packet with first shipment. The patient will receive a drug information handout for each medication shipped and additional FDA Medication Guides as required.       DISEASE/MEDICATION-SPECIFIC INFORMATION        N/A    SPECIALTY MEDICATION ADHERENCE     Medication Adherence    Patient reported X missed doses in the last month: 0  Specialty Medication: Myfortic 180mg   Patient is on additional specialty medications: No  Additional Specialty Medications: Myfortic 180mg   Informant: patient                Myfortic 180 mg: 13 days of medicine on hand         SHIPPING     Shipping address confirmed in Epic.     Delivery Scheduled: Yes, Expected medication delivery date: 04/04/20.     Medication will be delivered via Same Day Courier to the prescription address in Epic [place].    [name]   [facility] Specialty Technician

## 2020-03-25 ENCOUNTER — Encounter: Admit: 2020-03-25 | Discharge: 2020-03-26 | Payer: PRIVATE HEALTH INSURANCE

## 2020-03-25 LAB — CBC W/ AUTO DIFF
BASOPHILS ABSOLUTE COUNT: 0 10*9/L (ref 0.0–0.1)
BASOPHILS RELATIVE PERCENT: 0.7 %
EOSINOPHILS ABSOLUTE COUNT: 0.1 10*9/L (ref 0.0–0.7)
EOSINOPHILS RELATIVE PERCENT: 2.7 %
HEMATOCRIT: 36.1 % — ABNORMAL LOW (ref 38.0–50.0)
HEMOGLOBIN: 12.3 g/dL — ABNORMAL LOW (ref 13.5–17.5)
LYMPHOCYTES ABSOLUTE COUNT: 1 10*9/L (ref 0.7–4.0)
LYMPHOCYTES RELATIVE PERCENT: 29.2 %
MEAN CORPUSCULAR HEMOGLOBIN: 31 pg (ref 26.0–34.0)
MEAN CORPUSCULAR VOLUME: 91.1 fL (ref 81.0–95.0)
MEAN PLATELET VOLUME: 7.1 fL (ref 7.0–10.0)
MONOCYTES ABSOLUTE COUNT: 0.4 10*9/L (ref 0.1–1.0)
NEUTROPHILS ABSOLUTE COUNT: 1.9 10*9/L (ref 1.7–7.7)
NEUTROPHILS RELATIVE PERCENT: 55.7 %
NUCLEATED RED BLOOD CELLS: 0 /100{WBCs} (ref <=4)
PLATELET COUNT: 227 10*9/L (ref 150–450)
RED CELL DISTRIBUTION WIDTH: 14.6 % (ref 12.0–15.0)

## 2020-03-25 LAB — BASIC METABOLIC PANEL
ANION GAP: 7 mmol/L (ref 5–14)
BLOOD UREA NITROGEN: 26 mg/dL — ABNORMAL HIGH (ref 9–23)
BUN / CREAT RATIO: 16
CALCIUM: 9.8 mg/dL (ref 8.7–10.4)
CHLORIDE: 106 mmol/L (ref 98–107)
CO2: 26.6 mmol/L (ref 20.0–31.0)
CREATININE: 1.58 mg/dL — ABNORMAL HIGH
EGFR CKD-EPI AA MALE: 64 mL/min/{1.73_m2} (ref >=60)
EGFR CKD-EPI NON-AA MALE: 55 mL/min/{1.73_m2} — ABNORMAL LOW (ref >=60)
GLUCOSE RANDOM: 98 mg/dL (ref 70–179)

## 2020-03-25 LAB — PHOSPHORUS: Phosphate:MCnc:Pt:Ser/Plas:Qn:: 3.9

## 2020-03-25 LAB — SLIDE REVIEW

## 2020-03-25 LAB — TOXIC VACUOLATION

## 2020-03-25 LAB — RED CELL DISTRIBUTION WIDTH: Lab: 14.6

## 2020-03-25 LAB — CALCIUM: Calcium:MCnc:Pt:Ser/Plas:Qn:: 9.8

## 2020-03-25 LAB — MAGNESIUM: Magnesium:MCnc:Pt:Ser/Plas:Qn:: 1.9

## 2020-04-01 ENCOUNTER — Ambulatory Visit: Admit: 2020-04-01 | Discharge: 2020-04-02 | Payer: PRIVATE HEALTH INSURANCE

## 2020-04-01 LAB — CBC W/ AUTO DIFF
BASOPHILS ABSOLUTE COUNT: 0 10*9/L (ref 0.0–0.1)
BASOPHILS RELATIVE PERCENT: 0.8 %
EOSINOPHILS ABSOLUTE COUNT: 0.1 10*9/L (ref 0.0–0.7)
EOSINOPHILS RELATIVE PERCENT: 2.4 %
HEMATOCRIT: 36.5 % — ABNORMAL LOW (ref 38.0–50.0)
HEMOGLOBIN: 12.3 g/dL — ABNORMAL LOW (ref 13.5–17.5)
LYMPHOCYTES ABSOLUTE COUNT: 0.7 10*9/L (ref 0.7–4.0)
LYMPHOCYTES RELATIVE PERCENT: 28.6 %
MEAN CORPUSCULAR HEMOGLOBIN CONC: 33.7 g/dL (ref 30.0–36.0)
MEAN CORPUSCULAR HEMOGLOBIN: 30.3 pg (ref 26.0–34.0)
MEAN CORPUSCULAR VOLUME: 90 fL (ref 81.0–95.0)
MEAN PLATELET VOLUME: 7.4 fL (ref 7.0–10.0)
MONOCYTES ABSOLUTE COUNT: 0.3 10*9/L (ref 0.1–1.0)
MONOCYTES RELATIVE PERCENT: 11.8 %
NEUTROPHILS ABSOLUTE COUNT: 1.5 10*9/L — ABNORMAL LOW (ref 1.7–7.7)
NEUTROPHILS RELATIVE PERCENT: 56.4 %
NUCLEATED RED BLOOD CELLS: 0 /100{WBCs} (ref <=4)
PLATELET COUNT: 223 10*9/L (ref 150–450)
RED BLOOD CELL COUNT: 4.06 10*12/L — ABNORMAL LOW (ref 4.32–5.72)
RED CELL DISTRIBUTION WIDTH: 14.8 % (ref 12.0–15.0)
WBC ADJUSTED: 2.6 10*9/L — ABNORMAL LOW (ref 3.5–10.5)

## 2020-04-01 LAB — BASIC METABOLIC PANEL
ANION GAP: 5 mmol/L (ref 5–14)
BLOOD UREA NITROGEN: 24 mg/dL — ABNORMAL HIGH (ref 9–23)
BUN / CREAT RATIO: 15
CALCIUM: 10 mg/dL (ref 8.7–10.4)
CHLORIDE: 108 mmol/L — ABNORMAL HIGH (ref 98–107)
CO2: 27.4 mmol/L (ref 20.0–31.0)
CREATININE: 1.6 mg/dL — ABNORMAL HIGH
EGFR CKD-EPI AA MALE: 63 mL/min/{1.73_m2} (ref >=60)
EGFR CKD-EPI NON-AA MALE: 55 mL/min/{1.73_m2} — ABNORMAL LOW (ref >=60)
GLUCOSE RANDOM: 97 mg/dL (ref 70–179)
POTASSIUM: 4 mmol/L (ref 3.4–4.5)
SODIUM: 140 mmol/L (ref 135–145)

## 2020-04-01 LAB — PHOSPHORUS: PHOSPHORUS: 3.5 mg/dL (ref 2.4–5.1)

## 2020-04-01 LAB — MAGNESIUM: MAGNESIUM: 1.8 mg/dL (ref 1.6–2.6)

## 2020-04-04 MED FILL — MYFORTIC 180 MG TABLET,DELAYED RELEASE: ORAL | 30 days supply | Qty: 60 | Fill #3

## 2020-04-04 MED FILL — MYFORTIC 180 MG TABLET,DELAYED RELEASE: 30 days supply | Qty: 60 | Fill #3 | Status: AC

## 2020-04-08 ENCOUNTER — Encounter: Admit: 2020-04-08 | Discharge: 2020-04-09 | Payer: PRIVATE HEALTH INSURANCE

## 2020-04-08 DIAGNOSIS — Z79899 Other long term (current) drug therapy: Principal | ICD-10-CM

## 2020-04-08 DIAGNOSIS — Z940 Kidney transplant status: Principal | ICD-10-CM

## 2020-04-08 LAB — BASIC METABOLIC PANEL
ANION GAP: 4 mmol/L — ABNORMAL LOW (ref 5–14)
BLOOD UREA NITROGEN: 21 mg/dL (ref 9–23)
BUN / CREAT RATIO: 13
CALCIUM: 10.1 mg/dL (ref 8.7–10.4)
CHLORIDE: 106 mmol/L (ref 98–107)
CO2: 30.1 mmol/L (ref 20.0–31.0)
CREATININE: 1.61 mg/dL — ABNORMAL HIGH
EGFR CKD-EPI NON-AA MALE: 54 mL/min/{1.73_m2} — ABNORMAL LOW (ref >=60)
GLUCOSE RANDOM: 94 mg/dL (ref 70–179)
POTASSIUM: 3.9 mmol/L (ref 3.4–4.5)
SODIUM: 140 mmol/L (ref 135–145)

## 2020-04-08 LAB — MAGNESIUM
MAGNESIUM: 1.9 mg/dL (ref 1.6–2.6)
Magnesium:MCnc:Pt:Ser/Plas:Qn:: 1.9

## 2020-04-08 LAB — CBC W/ AUTO DIFF
BASOPHILS ABSOLUTE COUNT: 0 10*9/L (ref 0.0–0.1)
BASOPHILS RELATIVE PERCENT: 0.7 %
EOSINOPHILS ABSOLUTE COUNT: 0.1 10*9/L (ref 0.0–0.7)
EOSINOPHILS RELATIVE PERCENT: 3.3 %
HEMOGLOBIN: 12.3 g/dL — ABNORMAL LOW (ref 13.5–17.5)
LYMPHOCYTES ABSOLUTE COUNT: 0.7 10*9/L (ref 0.7–4.0)
MEAN CORPUSCULAR HEMOGLOBIN CONC: 33.4 g/dL (ref 30.0–36.0)
MEAN CORPUSCULAR HEMOGLOBIN: 30.1 pg (ref 26.0–34.0)
MEAN CORPUSCULAR VOLUME: 90.1 fL (ref 81.0–95.0)
MEAN PLATELET VOLUME: 7.4 fL (ref 7.0–10.0)
MONOCYTES ABSOLUTE COUNT: 0.3 10*9/L (ref 0.1–1.0)
NEUTROPHILS ABSOLUTE COUNT: 1.1 10*9/L — ABNORMAL LOW (ref 1.7–7.7)
NEUTROPHILS RELATIVE PERCENT: 49.4 %
NUCLEATED RED BLOOD CELLS: 0 /100{WBCs} (ref <=4)
PLATELET COUNT: 220 10*9/L (ref 150–450)
RED BLOOD CELL COUNT: 4.1 10*12/L — ABNORMAL LOW (ref 4.32–5.72)
RED CELL DISTRIBUTION WIDTH: 15.1 % — ABNORMAL HIGH (ref 12.0–15.0)
WBC ADJUSTED: 2.2 10*9/L — ABNORMAL LOW (ref 3.5–10.5)

## 2020-04-08 LAB — PHOSPHORUS: Phosphate:MCnc:Pt:Ser/Plas:Qn:: 3.3

## 2020-04-08 LAB — SLIDE REVIEW

## 2020-04-08 LAB — CREATININE: Creatinine:MCnc:Pt:Ser/Plas:Qn:: 1.61 — ABNORMAL HIGH

## 2020-04-08 LAB — TOXIC GRANULATION

## 2020-04-08 LAB — LYMPHOCYTES RELATIVE PERCENT: Lymphocytes/100 leukocytes:NFr:Pt:Bld:Qn:Automated count: 33.9

## 2020-04-13 ENCOUNTER — Ambulatory Visit: Admit: 2020-04-13 | Discharge: 2020-04-14 | Payer: PRIVATE HEALTH INSURANCE

## 2020-04-13 DIAGNOSIS — Z79899 Other long term (current) drug therapy: Principal | ICD-10-CM

## 2020-04-13 DIAGNOSIS — Z940 Kidney transplant status: Principal | ICD-10-CM

## 2020-04-13 LAB — BASIC METABOLIC PANEL
ANION GAP: 7 mmol/L (ref 5–14)
BLOOD UREA NITROGEN: 27 mg/dL — ABNORMAL HIGH (ref 9–23)
BUN / CREAT RATIO: 17
CALCIUM: 9.8 mg/dL (ref 8.7–10.4)
CHLORIDE: 108 mmol/L — ABNORMAL HIGH (ref 98–107)
CO2: 24.1 mmol/L (ref 20.0–31.0)
CREATININE: 1.63 mg/dL — ABNORMAL HIGH
EGFR CKD-EPI AA MALE: 62 mL/min/{1.73_m2} (ref >=60)
EGFR CKD-EPI NON-AA MALE: 53 mL/min/{1.73_m2} — ABNORMAL LOW (ref >=60)
GLUCOSE RANDOM: 104 mg/dL (ref 70–179)
POTASSIUM: 4.2 mmol/L (ref 3.4–4.5)
SODIUM: 139 mmol/L (ref 135–145)

## 2020-04-13 LAB — CBC W/ AUTO DIFF
BASOPHILS ABSOLUTE COUNT: 0 10*9/L (ref 0.0–0.1)
BASOPHILS RELATIVE PERCENT: 0.6 %
EOSINOPHILS ABSOLUTE COUNT: 0 10*9/L (ref 0.0–0.7)
EOSINOPHILS RELATIVE PERCENT: 1 %
HEMATOCRIT: 37.2 % — ABNORMAL LOW (ref 38.0–50.0)
HEMOGLOBIN: 12.7 g/dL — ABNORMAL LOW (ref 13.5–17.5)
LYMPHOCYTES ABSOLUTE COUNT: 0.7 10*9/L (ref 0.7–4.0)
LYMPHOCYTES RELATIVE PERCENT: 23.7 %
MEAN CORPUSCULAR HEMOGLOBIN CONC: 34.2 g/dL (ref 30.0–36.0)
MEAN CORPUSCULAR HEMOGLOBIN: 30.1 pg (ref 26.0–34.0)
MEAN CORPUSCULAR VOLUME: 87.9 fL (ref 81.0–95.0)
MEAN PLATELET VOLUME: 7.2 fL (ref 7.0–10.0)
MONOCYTES ABSOLUTE COUNT: 0.3 10*9/L (ref 0.1–1.0)
MONOCYTES RELATIVE PERCENT: 10.4 %
NEUTROPHILS ABSOLUTE COUNT: 1.8 10*9/L (ref 1.7–7.7)
NEUTROPHILS RELATIVE PERCENT: 64.3 %
NUCLEATED RED BLOOD CELLS: 0 /100{WBCs} (ref <=4)
PLATELET COUNT: 212 10*9/L (ref 150–450)
RED BLOOD CELL COUNT: 4.23 10*12/L — ABNORMAL LOW (ref 4.32–5.72)
RED CELL DISTRIBUTION WIDTH: 14.7 % (ref 12.0–15.0)
WBC ADJUSTED: 2.8 10*9/L — ABNORMAL LOW (ref 3.5–10.5)

## 2020-04-13 LAB — PHOSPHORUS: PHOSPHORUS: 3.2 mg/dL (ref 2.4–5.1)

## 2020-04-13 LAB — MAGNESIUM: MAGNESIUM: 2 mg/dL (ref 1.6–2.6)

## 2020-04-18 NOTE — Unmapped (Signed)
[facility] Refill Coordination Note    Specialty Medication(s) to be Shipped:   Transplant: Prednisone 5mg     Other medication(s) to be shipped: No additional medications requested for fill at this time     KHIREE BUKHARI, DOB: [birth year]  Phone: [phone] (home)       All above HIPAA information was verified with patient.     Was a Nurse, learning disability used for this call? No    Completed refill call assessment today to schedule patient's medication shipment from the [facility] [phone]).       Specialty medication(s) and dose(s) confirmed: Regimen is correct and unchanged.   Changes to medications: Renny reports no changes at this time.  Changes to insurance: No  Questions for the pharmacist: No    Confirmed patient received Welcome Packet with first shipment. The patient will receive a drug information handout for each medication shipped and additional FDA Medication Guides as required.       DISEASE/MEDICATION-SPECIFIC INFORMATION        N/A    SPECIALTY MEDICATION ADHERENCE     Medication Adherence    Patient reported X missed doses in the last month: 0  Specialty Medication: Prednisone  Patient is on additional specialty medications: No  Patient is on more than two specialty medications: No  Any gaps in refill history greater than 2 weeks in the last 3 months: no  Demonstrates understanding of importance of adherence: yes  Informant: patient                Prednisone 5mg : Patient has 1 day of medication on hand      SHIPPING     Shipping address confirmed in Epic.     Delivery Scheduled: Yes, Expected medication delivery date: 11/16.     Medication will be delivered via Same Day Courier to the prescription address in Epic [facility].    Olga Millers   [facility] Specialty Technician

## 2020-04-19 ENCOUNTER — Ambulatory Visit: Admit: 2020-04-19 | Discharge: 2020-04-20 | Payer: PRIVATE HEALTH INSURANCE

## 2020-04-19 DIAGNOSIS — Z4822 Encounter for aftercare following kidney transplant: Principal | ICD-10-CM

## 2020-04-19 DIAGNOSIS — Z79899 Other long term (current) drug therapy: Principal | ICD-10-CM

## 2020-04-19 DIAGNOSIS — Z940 Kidney transplant status: Principal | ICD-10-CM

## 2020-04-19 LAB — CBC W/ AUTO DIFF
BASOPHILS ABSOLUTE COUNT: 0 10*9/L (ref 0.0–0.1)
BASOPHILS RELATIVE PERCENT: 0.8 %
EOSINOPHILS ABSOLUTE COUNT: 0 10*9/L (ref 0.0–0.4)
EOSINOPHILS RELATIVE PERCENT: 1.3 %
HEMATOCRIT: 36.9 % — ABNORMAL LOW (ref 41.0–53.0)
HEMOGLOBIN: 12.4 g/dL — ABNORMAL LOW (ref 13.5–17.5)
LARGE UNSTAINED CELLS: 2 % (ref 0–4)
LYMPHOCYTES ABSOLUTE COUNT: 0.5 10*9/L — ABNORMAL LOW (ref 1.5–5.0)
LYMPHOCYTES RELATIVE PERCENT: 23.9 %
MEAN CORPUSCULAR HEMOGLOBIN CONC: 33.6 g/dL (ref 31.0–37.0)
MEAN CORPUSCULAR HEMOGLOBIN: 31.1 pg (ref 26.0–34.0)
MEAN CORPUSCULAR VOLUME: 92.4 fL (ref 80.0–100.0)
MEAN PLATELET VOLUME: 8.3 fL (ref 7.0–10.0)
MONOCYTES ABSOLUTE COUNT: 0.2 10*9/L (ref 0.2–0.8)
MONOCYTES RELATIVE PERCENT: 7.1 %
NEUTROPHILS ABSOLUTE COUNT: 1.5 10*9/L — ABNORMAL LOW (ref 2.0–7.5)
NEUTROPHILS RELATIVE PERCENT: 65.3 %
PLATELET COUNT: 227 10*9/L (ref 150–440)
RED BLOOD CELL COUNT: 3.99 10*12/L — ABNORMAL LOW (ref 4.50–5.90)
RED CELL DISTRIBUTION WIDTH: 14.5 % (ref 12.0–15.0)
WBC ADJUSTED: 2.2 10*9/L — ABNORMAL LOW (ref 4.5–11.0)

## 2020-04-19 LAB — BASIC METABOLIC PANEL
ANION GAP: 4 mmol/L — ABNORMAL LOW (ref 5–14)
BLOOD UREA NITROGEN: 21 mg/dL (ref 9–23)
BUN / CREAT RATIO: 12
CALCIUM: 9.9 mg/dL (ref 8.7–10.4)
CHLORIDE: 101 mmol/L (ref 98–107)
CO2: 29 mmol/L (ref 20.0–31.0)
CREATININE: 1.69 mg/dL — ABNORMAL HIGH
EGFR CKD-EPI AA MALE: 59 mL/min/{1.73_m2} — ABNORMAL LOW (ref >=60)
EGFR CKD-EPI NON-AA MALE: 51 mL/min/{1.73_m2} — ABNORMAL LOW (ref >=60)
GLUCOSE RANDOM: 93 mg/dL (ref 70–179)
POTASSIUM: 4 mmol/L (ref 3.4–4.5)
SODIUM: 134 mmol/L — ABNORMAL LOW (ref 135–145)

## 2020-04-19 LAB — SLIDE REVIEW

## 2020-04-19 LAB — PHOSPHORUS: PHOSPHORUS: 3.1 mg/dL (ref 2.4–5.1)

## 2020-04-19 LAB — MAGNESIUM: MAGNESIUM: 1.9 mg/dL (ref 1.6–2.6)

## 2020-04-19 MED ORDER — ASPIRIN 81 MG TABLET,DELAYED RELEASE
ORAL_TABLET | Freq: Every day | ORAL | 11 refills | 30 days
Start: 2020-04-19 — End: 2021-04-19

## 2020-04-19 MED ORDER — LOSARTAN 25 MG TABLET
ORAL_TABLET | Freq: Two times a day (BID) | ORAL | 11 refills | 30 days
Start: 2020-04-19 — End: 2021-04-19

## 2020-04-19 MED ADMIN — belatacept (NULOJIX) 387.5 mg in sodium chloride (NS) 0.9 % 100 mL IVPB: 5 mg/kg | INTRAVENOUS | @ 18:00:00 | Stop: 2020-04-19

## 2020-04-19 MED FILL — PREDNISONE 5 MG TABLET: ORAL | 30 days supply | Qty: 30 | Fill #10

## 2020-04-19 MED FILL — PREDNISONE 5 MG TABLET: 30 days supply | Qty: 30 | Fill #10 | Status: AC

## 2020-04-19 NOTE — Unmapped (Addendum)
Medication delivery changed to UPS for 11/17, patient is aware

## 2020-04-19 NOTE — Unmapped (Signed)
1255 Patient arrived to the Transplant Infusion Room today for belatacept 387.5mg , Condition: well; Mobility: ambulating; accompanied by self.   See Flowsheet and MAR for all details of visit.   1257 VS stable.  1310 PIV placed and secured with coban; labs collected and sent, urine no orders found.  1311 Infusion initiated.  1343 Infusion complete.   1349 VS stable, Line flushed with NS, PIV removed and secured with coban. Patient left clinic today, Condition: well; Mobility: ambulating; accompanied by self.

## 2020-04-19 NOTE — Unmapped (Signed)
Pt request for RX Refill aspirin (ECOTRIN) 81 MG tablet

## 2020-04-20 MED FILL — MG-PLUS-PROTEIN 133 MG TABLET: 100 days supply | Qty: 100 | Fill #3 | Status: AC

## 2020-04-20 MED FILL — ASPIRIN 81 MG TABLET,DELAYED RELEASE: 90 days supply | Qty: 90 | Fill #0 | Status: AC

## 2020-04-20 MED FILL — ASPIRIN 81 MG TABLET,DELAYED RELEASE: ORAL | 90 days supply | Qty: 90 | Fill #0

## 2020-04-20 MED FILL — OMEPRAZOLE 20 MG CAPSULE,DELAYED RELEASE: 30 days supply | Qty: 30 | Fill #2 | Status: AC

## 2020-04-20 MED FILL — OMEPRAZOLE 20 MG CAPSULE,DELAYED RELEASE: ORAL | 30 days supply | Qty: 30 | Fill #2

## 2020-04-20 MED FILL — MG-PLUS-PROTEIN 133 MG TABLET: ORAL | 100 days supply | Qty: 100 | Fill #3

## 2020-04-26 DIAGNOSIS — Z940 Kidney transplant status: Principal | ICD-10-CM

## 2020-04-26 DIAGNOSIS — Z79899 Other long term (current) drug therapy: Principal | ICD-10-CM

## 2020-04-26 NOTE — Unmapped (Signed)
Left message for patient to return my call.

## 2020-05-03 MED ORDER — LOSARTAN 25 MG TABLET
ORAL_TABLET | Freq: Two times a day (BID) | ORAL | 11 refills | 30 days | Status: CP
Start: 2020-05-03 — End: 2020-05-26

## 2020-05-05 ENCOUNTER — Encounter: Admit: 2020-05-05 | Discharge: 2020-05-06 | Payer: PRIVATE HEALTH INSURANCE

## 2020-05-05 DIAGNOSIS — Z940 Kidney transplant status: Principal | ICD-10-CM

## 2020-05-05 DIAGNOSIS — Z79899 Other long term (current) drug therapy: Principal | ICD-10-CM

## 2020-05-05 LAB — BASIC METABOLIC PANEL
ANION GAP: 6 mmol/L (ref 5–14)
BLOOD UREA NITROGEN: 21 mg/dL (ref 9–23)
BUN / CREAT RATIO: 14
CALCIUM: 10 mg/dL (ref 8.7–10.4)
CHLORIDE: 108 mmol/L — ABNORMAL HIGH (ref 98–107)
CO2: 25.1 mmol/L (ref 20.0–31.0)
CREATININE: 1.47 mg/dL — ABNORMAL HIGH
EGFR CKD-EPI AA MALE: 70 mL/min/{1.73_m2} (ref >=60)
EGFR CKD-EPI NON-AA MALE: 61 mL/min/{1.73_m2} (ref >=60)
GLUCOSE RANDOM: 98 mg/dL (ref 70–179)
POTASSIUM: 4.4 mmol/L (ref 3.4–4.5)
SODIUM: 139 mmol/L (ref 135–145)

## 2020-05-05 LAB — CBC W/ AUTO DIFF
BASOPHILS ABSOLUTE COUNT: 0 10*9/L (ref 0.0–0.1)
BASOPHILS RELATIVE PERCENT: 0.4 %
EOSINOPHILS ABSOLUTE COUNT: 0 10*9/L (ref 0.0–0.7)
EOSINOPHILS RELATIVE PERCENT: 1.1 %
HEMATOCRIT: 36.2 % — ABNORMAL LOW (ref 38.0–50.0)
HEMOGLOBIN: 12 g/dL — ABNORMAL LOW (ref 13.5–17.5)
LYMPHOCYTES ABSOLUTE COUNT: 0.8 10*9/L (ref 0.7–4.0)
LYMPHOCYTES RELATIVE PERCENT: 31.5 %
MEAN CORPUSCULAR HEMOGLOBIN CONC: 33.3 g/dL (ref 30.0–36.0)
MEAN CORPUSCULAR HEMOGLOBIN: 30 pg (ref 26.0–34.0)
MEAN CORPUSCULAR VOLUME: 90.2 fL (ref 81.0–95.0)
MEAN PLATELET VOLUME: 7.6 fL (ref 7.0–10.0)
MONOCYTES ABSOLUTE COUNT: 0.3 10*9/L (ref 0.1–1.0)
MONOCYTES RELATIVE PERCENT: 11.4 %
NEUTROPHILS ABSOLUTE COUNT: 1.4 10*9/L — ABNORMAL LOW (ref 1.7–7.7)
NEUTROPHILS RELATIVE PERCENT: 55.6 %
NUCLEATED RED BLOOD CELLS: 0 /100{WBCs} (ref <=4)
PLATELET COUNT: 211 10*9/L (ref 150–450)
RED BLOOD CELL COUNT: 4.02 10*12/L — ABNORMAL LOW (ref 4.32–5.72)
RED CELL DISTRIBUTION WIDTH: 14.6 % (ref 12.0–15.0)
WBC ADJUSTED: 2.6 10*9/L — ABNORMAL LOW (ref 3.5–10.5)

## 2020-05-05 LAB — PHOSPHORUS: PHOSPHORUS: 2.9 mg/dL (ref 2.4–5.1)

## 2020-05-05 LAB — MAGNESIUM: MAGNESIUM: 2 mg/dL (ref 1.6–2.6)

## 2020-05-05 LAB — SLIDE REVIEW

## 2020-05-06 NOTE — Unmapped (Signed)
[facility] Refill Coordination Note    Specialty Medication(s) to be Shipped:   Transplant: Myfortic 180mg     Other medication(s) to be shipped: losartan   Pt wants call back in 1 week on other meds     Ryan Moon, DOB: Dec 28, 1985  Phone: [phone] (home)       All above HIPAA information was verified with patient.     Was a Nurse, learning disability used for this call? No    Completed refill call assessment today to schedule patient's medication shipment from the [facility] [phone]).       Specialty medication(s) and dose(s) confirmed: Regimen is correct and unchanged.   Changes to medications: [name] reports no changes at this time.  Changes to insurance: No  Questions for the pharmacist: No    Confirmed patient received Welcome Packet with first shipment. The patient will receive a drug information handout for each medication shipped and additional FDA Medication Guides as required.       DISEASE/MEDICATION-SPECIFIC INFORMATION        N/A    SPECIALTY MEDICATION ADHERENCE     Medication Adherence    Patient reported X missed doses in the last month: 0  Specialty Medication: myfortic 180mg   Patient is on additional specialty medications: Yes  Additional Specialty Medications: Prednisone 5mg   Patient Reported Additional Medication X Missed Doses in the Last Month: 0                Myfortic 180mg   : 8 days of medicine on hand   Prednisone 5mg   : 18 days of medicine on hand         SHIPPING     Shipping address confirmed in Epic.     Delivery Scheduled: Yes, Expected medication delivery date: 05/10/2020.     Medication will be delivered via UPS to the prescription address in Epic [facility].    [name]   [facility] Specialty Pharmacist

## 2020-05-09 MED FILL — MYFORTIC 180 MG TABLET,DELAYED RELEASE: 30 days supply | Qty: 60 | Fill #4 | Status: AC

## 2020-05-09 MED FILL — MYFORTIC 180 MG TABLET,DELAYED RELEASE: ORAL | 30 days supply | Qty: 60 | Fill #4

## 2020-05-12 NOTE — Unmapped (Signed)
Pt called, left VM stating he was having cold symptoms and wanted to know what he should take. Called pt back, unable to reach, left VM with call back number.

## 2020-05-16 MED FILL — OMEPRAZOLE 20 MG CAPSULE,DELAYED RELEASE: ORAL | 30 days supply | Qty: 30 | Fill #3

## 2020-05-16 MED FILL — PREDNISONE 5 MG TABLET: 30 days supply | Qty: 30 | Fill #11 | Status: AC

## 2020-05-16 MED FILL — PREDNISONE 5 MG TABLET: ORAL | 30 days supply | Qty: 30 | Fill #11

## 2020-05-16 MED FILL — OMEPRAZOLE 20 MG CAPSULE,DELAYED RELEASE: 30 days supply | Qty: 30 | Fill #3 | Status: AC

## 2020-05-16 NOTE — Unmapped (Signed)
[facility] Refill Coordination Note    Specialty Medication(s) to be Shipped:   Transplant: Prednisone 5mg     Other medication(s) to be shipped: omeprazole 20mg      [name], DOB: February 04, 1986  Phone: [phone] (home)       All above HIPAA information was verified with patient.     Was a Nurse, learning disability used for this call? No    Completed refill call assessment today to schedule patient's medication shipment from the [facility] [phone]).       Specialty medication(s) and dose(s) confirmed: Regimen is correct and unchanged.   Changes to medications: Channon reports no changes at this time.  Changes to insurance: No  Questions for the pharmacist: No    Confirmed patient received Welcome Packet with first shipment. The patient will receive a drug information handout for each medication shipped and additional FDA Medication Guides as required.       DISEASE/MEDICATION-SPECIFIC INFORMATION        N/A    SPECIALTY MEDICATION ADHERENCE     Medication Adherence    Patient reported X missed doses in the last month: 0  Specialty Medication: prednisone 5 mg  Patient is on additional specialty medications: No                Prednisone 5 mg: 3 days of medicine on hand          SHIPPING     Shipping address confirmed in Epic.     Delivery Scheduled: Yes, Expected medication delivery date: 05/17/20.     Medication will be delivered via UPS to the prescription address in Epic [facility].    Unk Lightning   [facility] Specialty Technician

## 2020-05-17 DIAGNOSIS — Z940 Kidney transplant status: Principal | ICD-10-CM

## 2020-05-17 DIAGNOSIS — Z79899 Other long term (current) drug therapy: Principal | ICD-10-CM

## 2020-05-17 NOTE — Unmapped (Signed)
Addended by: Thayer Headings L on: 05/17/2020 01:19 PM     Modules accepted: Orders, SmartSet

## 2020-05-18 ENCOUNTER — Encounter: Admit: 2020-05-18 | Discharge: 2020-05-18 | Payer: PRIVATE HEALTH INSURANCE

## 2020-05-18 ENCOUNTER — Ambulatory Visit: Admit: 2020-05-18 | Discharge: 2020-05-18 | Payer: PRIVATE HEALTH INSURANCE

## 2020-05-18 ENCOUNTER — Encounter
Admit: 2020-05-18 | Discharge: 2020-05-18 | Payer: PRIVATE HEALTH INSURANCE | Attending: Nephrology | Primary: Nephrology

## 2020-05-18 DIAGNOSIS — Z940 Kidney transplant status: Principal | ICD-10-CM

## 2020-05-18 DIAGNOSIS — Z79899 Other long term (current) drug therapy: Principal | ICD-10-CM

## 2020-05-18 DIAGNOSIS — R945 Abnormal results of liver function studies: Principal | ICD-10-CM

## 2020-05-18 DIAGNOSIS — D849 Immunodeficiency, unspecified: Principal | ICD-10-CM

## 2020-05-18 DIAGNOSIS — I10 Essential (primary) hypertension: Principal | ICD-10-CM

## 2020-05-18 DIAGNOSIS — R809 Proteinuria, unspecified: Principal | ICD-10-CM

## 2020-05-18 LAB — CBC W/ AUTO DIFF
BASOPHILS ABSOLUTE COUNT: 0 10*9/L (ref 0.0–0.1)
BASOPHILS RELATIVE PERCENT: 0.8 %
EOSINOPHILS ABSOLUTE COUNT: 0 10*9/L (ref 0.0–0.4)
EOSINOPHILS RELATIVE PERCENT: 2.1 %
HEMATOCRIT: 37.1 % — ABNORMAL LOW (ref 41.0–53.0)
HEMOGLOBIN: 12.2 g/dL — ABNORMAL LOW (ref 13.5–17.5)
LARGE UNSTAINED CELLS: 3 % (ref 0–4)
LYMPHOCYTES ABSOLUTE COUNT: 0.6 10*9/L — ABNORMAL LOW (ref 1.5–5.0)
LYMPHOCYTES RELATIVE PERCENT: 26.2 %
MEAN CORPUSCULAR HEMOGLOBIN CONC: 32.8 g/dL (ref 31.0–37.0)
MEAN CORPUSCULAR HEMOGLOBIN: 30.3 pg (ref 26.0–34.0)
MEAN CORPUSCULAR VOLUME: 92.5 fL (ref 80.0–100.0)
MEAN PLATELET VOLUME: 8.8 fL (ref 7.0–10.0)
MONOCYTES ABSOLUTE COUNT: 0.3 10*9/L (ref 0.2–0.8)
MONOCYTES RELATIVE PERCENT: 13.2 %
NEUTROPHILS ABSOLUTE COUNT: 1.2 10*9/L — ABNORMAL LOW (ref 2.0–7.5)
NEUTROPHILS RELATIVE PERCENT: 55.2 %
PLATELET COUNT: 267 10*9/L (ref 150–440)
RED BLOOD CELL COUNT: 4.01 10*12/L — ABNORMAL LOW (ref 4.50–5.90)
RED CELL DISTRIBUTION WIDTH: 13.9 % (ref 12.0–15.0)
WBC ADJUSTED: 2.1 10*9/L — ABNORMAL LOW (ref 4.5–11.0)

## 2020-05-18 LAB — URINALYSIS
BACTERIA: NONE SEEN /HPF
BILIRUBIN UA: NEGATIVE
GLUCOSE UA: NEGATIVE
KETONES UA: NEGATIVE
LEUKOCYTE ESTERASE UA: NEGATIVE
NITRITE UA: NEGATIVE
PH UA: 6 (ref 5.0–9.0)
PROTEIN UA: 30 — AB
RBC UA: 1 /HPF (ref <=3)
SPECIFIC GRAVITY UA: 1.015 (ref 1.003–1.030)
SQUAMOUS EPITHELIAL: <1 /HPF (ref 0–5)
UROBILINOGEN UA: 0.2
WBC UA: <1 /HPF (ref <=2)

## 2020-05-18 LAB — HEMOGLOBIN A1C
ESTIMATED AVERAGE GLUCOSE: 108 mg/dL
HEMOGLOBIN A1C: 5.4 % (ref 4.8–5.6)

## 2020-05-18 LAB — PROTEIN / CREATININE RATIO, URINE
CREATININE, URINE: 93 mg/dL
PROTEIN URINE: 68.9 mg/dL
PROTEIN/CREAT RATIO, URINE: 0.741

## 2020-05-18 LAB — SLIDE REVIEW

## 2020-05-18 LAB — COMPREHENSIVE METABOLIC PANEL
ALBUMIN: 3.9 g/dL (ref 3.4–5.0)
ALKALINE PHOSPHATASE: 162 U/L — ABNORMAL HIGH (ref 46–116)
ALT (SGPT): 99 U/L — ABNORMAL HIGH (ref 10–49)
ANION GAP: 4 mmol/L — ABNORMAL LOW (ref 5–14)
AST (SGOT): 43 U/L — ABNORMAL HIGH (ref <=34)
BILIRUBIN TOTAL: 0.5 mg/dL (ref 0.3–1.2)
BLOOD UREA NITROGEN: 21 mg/dL (ref 9–23)
BUN / CREAT RATIO: 15
CALCIUM: 10.2 mg/dL (ref 8.7–10.4)
CHLORIDE: 105 mmol/L (ref 98–107)
CO2: 27 mmol/L (ref 20.0–31.0)
CREATININE: 1.38 mg/dL — ABNORMAL HIGH
EGFR CKD-EPI AA MALE: 75 mL/min/{1.73_m2} (ref >=60)
EGFR CKD-EPI NON-AA MALE: 65 mL/min/{1.73_m2} (ref >=60)
GLUCOSE RANDOM: 99 mg/dL (ref 70–179)
POTASSIUM: 4.3 mmol/L (ref 3.4–4.5)
PROTEIN TOTAL: 7.5 g/dL (ref 5.7–8.2)
SODIUM: 136 mmol/L (ref 135–145)

## 2020-05-18 LAB — LIPID PANEL
CHOLESTEROL/HDL RATIO SCREEN: 4.9 — ABNORMAL HIGH (ref 1.0–4.5)
CHOLESTEROL: 157 mg/dL (ref <=200)
HDL CHOLESTEROL: 32 mg/dL — ABNORMAL LOW (ref 40–60)
LDL CHOLESTEROL CALCULATED: 98 mg/dL (ref 40–99)
NON-HDL CHOLESTEROL: 125 mg/dL (ref 70–130)
TRIGLYCERIDES: 134 mg/dL (ref 0–150)
VLDL CHOLESTEROL CAL: 26.8 mg/dL (ref 10–50)

## 2020-05-18 LAB — MAGNESIUM: MAGNESIUM: 1.9 mg/dL (ref 1.6–2.6)

## 2020-05-18 LAB — PARATHYROID HORMONE (PTH): PARATHYROID HORMONE INTACT: 32.4 pg/mL (ref 18.4–80.1)

## 2020-05-18 LAB — PHOSPHORUS: PHOSPHORUS: 2.8 mg/dL (ref 2.4–5.1)

## 2020-05-18 MED ADMIN — belatacept (NULOJIX) 387.5 mg in sodium chloride (NS) 0.9 % 100 mL IVPB: 5 mg/kg | INTRAVENOUS | @ 17:00:00 | Stop: 2020-05-18

## 2020-05-18 NOTE — Unmapped (Signed)
Transplant Coordinator, Clinic Visit   Pt seen today by transplant nephrology for follow up, reviewed medications and symptoms. Pt had annual testing today including: RUS and CXR. Pt will have annual labs drawn during Belatacept infusion today.          05/18/20 0931   BP: 126/80   Pulse: 77   Temp: 36.4 ??C (97.6 ??F)   Weight: 78.3 kg (172 lb 9.6 oz)   Height: 182.9 cm (6' 0.01)   PainSc: 0-No pain       Assessment  BP: at home 120/70-80s  Lightheaded: denies  Headache: denies  Hand tremors: occasional   Numbness/tingling: denies  Fevers: denies  Chills/sweats: sweaty palms  Shortness of breath: denies  Chest pain or pressure: denies  Palpitations: denies  Abdominal pain: denies  Heart burn: denies  Nausea/vomiting: 1 x per day for past 2 days   Diarrhea/constipation: denies  UTI symptoms: denies  Swelling: noticed indention in leg after removing socks yesterday, BLE swelling not noted today     Good appetite; reports adequate hydration.     Any new medications? no  Immunosuppressant last taken: receives monthly Belatacept infusions    Immunization status: declines COVID-19 vaccine    Functional Score: 90     Able to carry on normal activity;  Minor signs or symptoms of disease.  Employment status is: works full time    I spent a total of 10 minutes with Ryan Moon reviewing medications and symptoms.

## 2020-05-18 NOTE — Unmapped (Signed)
1104 Patient arrived to the Transplant Infusion Room today for belatacept 387.5 mg IV. Condition: well; Mobility: ambulating; accompanied by self.   See Flowsheet and MAR for all details of visit.   1106 VS stable.   1128 PIV placed and secured with coban; labs collected and sent, urine collected and sent.  1132 Infusion initiated.  1157 Infusion complete.   1205 VS stable, Line flushed with NS, PIV removed and secured with coban. Patient left clinic today, Condition: well; Mobility: ambulating; accompanied by self.

## 2020-05-19 LAB — CMV DNA, QUANTITATIVE, PCR: CMV VIRAL LD: NOT DETECTED

## 2020-05-20 DIAGNOSIS — Z940 Kidney transplant status: Principal | ICD-10-CM

## 2020-05-20 DIAGNOSIS — Z79899 Other long term (current) drug therapy: Principal | ICD-10-CM

## 2020-05-20 LAB — VITAMIN D 25 HYDROXY: VITAMIN D, TOTAL (25OH): 30.7 ng/mL (ref 20.0–80.0)

## 2020-05-24 LAB — HLA DS POST TRANSPLANT
ANTI-DONOR DRW #1 MFI: 154 MFI
ANTI-DONOR DRW #2 MFI: 37 MFI
ANTI-DONOR HLA-A #1 MFI: 0 MFI
ANTI-DONOR HLA-A #2 MFI: 0 MFI
ANTI-DONOR HLA-B #1 MFI: 0 MFI
ANTI-DONOR HLA-B #2 MFI: 0 MFI
ANTI-DONOR HLA-C #1 MFI: 0 MFI
ANTI-DONOR HLA-C #2 MFI: 0 MFI
ANTI-DONOR HLA-DP #2 MFI: 49 MFI
ANTI-DONOR HLA-DQB #1 MFI: 64 MFI
ANTI-DONOR HLA-DQB #2 MFI: 64 MFI
ANTI-DONOR HLA-DR #1 MFI: 16 MFI
ANTI-DONOR HLA-DR #2 MFI: 109 MFI

## 2020-05-24 LAB — FSAB CLASS 1 ANTIBODY SPECIFICITY: HLA CLASS 1 ANTIBODY RESULT: NEGATIVE

## 2020-05-24 LAB — EBV QUANTITATIVE PCR, BLOOD: EBV VIRAL LOAD RESULT: NOT DETECTED

## 2020-05-24 LAB — FSAB CLASS 2 ANTIBODY SPECIFICITY: HLA CL2 AB RESULT: NEGATIVE

## 2020-05-26 MED ORDER — AMLODIPINE 5 MG TABLET
ORAL_TABLET | Freq: Every evening | ORAL | 11 refills | 30 days | Status: CP
Start: 2020-05-26 — End: 2021-05-26
  Filled 2020-06-01: qty 30, 30d supply, fill #0

## 2020-05-26 MED ORDER — LOSARTAN 50 MG TABLET
ORAL_TABLET | Freq: Two times a day (BID) | ORAL | 11 refills | 30 days | Status: CP
Start: 2020-05-26 — End: 2021-05-26
  Filled 2020-06-01: qty 60, 30d supply, fill #0

## 2020-05-27 NOTE — Unmapped (Signed)
[employer] [place] transplant nephrology clinic visit    assessment and plan  1. s/p kidney transplant 04/28/2019. baseline creatinine 1.5-1.8 mg/dl. +moderate proteinuria < 1 g. no donor specific hla ab detected.   2. immunosuppression. belatacept 5mg /kg q.month. prednisone 5mg  daily. mycophenolate 180mg  bid d/t leukopenia.  3. hypertension. +losartan incr to 50mg  bid d/t proteinuria. amlodipine decr to 5mg . blood pressure goal <130/80 mmhg.  4. abnormal liver function panel. +repeat LFTs with next labs.  5. preventive medicine. influenza '21. pcv13 pnuemococcal '19. ppsv23 pneumococcal '19. covid-19 vaccine refused/strongly recommended.    history of present illness    [name] is a 34 year old gentleman seen in follow up post kidney transplant 04/28/2019. no fever chills or sweats. no headache or lightheaded. +post nasal drip with associated cough to point of vomiting x2/past 2d. no chest pain palpitations orthopnea or shortness of breath. minimal dependent lower extremity edema. appetite nl. no abdominal pain no n/v/d. no myalgias or arthralgias. no dysuria hematuria or difficulty voiding. all other systems reviewed and negative x10 systems.    past medical hx:  1. 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 baseline creatinine 1.5-1.8 mg/dl.   > kidney bx 01/21: no acute/chr rejection.  2. hypertension  3. anxiety/depression    allergies: nkda    medications: belatacept 5mg /kg q.month, mycophenolate 180mg  bid, prednisone 5mg  daily, aspirin 81mg  daily, amlodipine 10mg  daily, losartan 25mg  bid, mg oxide 133mg  daily, omeprazole 20mg  daily, buspirone 15mg  bid, alprazolam 0.5mg  prn.    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 wd/wn gentleman appropriate affect and mood. sclera anicteric. mmm no thrush. neck supple no palpable ln. heart rrr nl s1s2 no m/r/g. lungs clear bilateral. abd soft nt/nd. no lower ext edema. msk no synovitis/tophi. skin no rash. neuro alert oriented non focal exam.    labs 05/18/20: wbc2.1 hgb12.2 hct37.1 plts267. na136 k4.3 cl105 bicarb27 bun21 cr1.4 glc99 ca10.2 mg1.9 phos2.8 albumin3.9. ast43 alt99 t.bili0.5 alk phos162. hgb a1c 5.4%. no donor specific hla ab detected. cmv pcr not detected. urine protein/cr 0.741.

## 2020-05-31 NOTE — Unmapped (Signed)
Received message from Dr. Toni Arthurs, increasing Losartan to 50mg  BID and decreasing Amlodipine to 5mg  at night. Prescriptions sent to [facility]. Pt made aware of medication changes, verbalized understanding.

## 2020-06-01 MED FILL — AMLODIPINE 5 MG TABLET: 30 days supply | Qty: 30 | Fill #0 | Status: AC

## 2020-06-01 MED FILL — LOSARTAN 50 MG TABLET: 30 days supply | Qty: 60 | Fill #0 | Status: AC

## 2020-06-06 ENCOUNTER — Encounter: Admit: 2020-06-06 | Discharge: 2020-06-07 | Payer: PRIVATE HEALTH INSURANCE

## 2020-06-06 DIAGNOSIS — Z940 Kidney transplant status: Principal | ICD-10-CM

## 2020-06-06 DIAGNOSIS — Z79899 Other long term (current) drug therapy: Principal | ICD-10-CM

## 2020-06-06 LAB — BASIC METABOLIC PANEL
ANION GAP: 6 mmol/L (ref 5–14)
BLOOD UREA NITROGEN: 25 mg/dL — ABNORMAL HIGH (ref 9–23)
BUN / CREAT RATIO: 17
CALCIUM: 9.8 mg/dL (ref 8.7–10.4)
CHLORIDE: 108 mmol/L — ABNORMAL HIGH (ref 98–107)
CO2: 25.1 mmol/L (ref 20.0–31.0)
CREATININE: 1.43 mg/dL — ABNORMAL HIGH
EGFR CKD-EPI AA MALE: 72 mL/min/{1.73_m2} (ref >=60)
EGFR CKD-EPI NON-AA MALE: 63 mL/min/{1.73_m2} (ref >=60)
GLUCOSE RANDOM: 101 mg/dL (ref 70–179)
POTASSIUM: 3.7 mmol/L (ref 3.4–4.5)
SODIUM: 139 mmol/L (ref 135–145)

## 2020-06-06 LAB — CBC W/ AUTO DIFF
BASOPHILS ABSOLUTE COUNT: 0 10*9/L (ref 0.0–0.1)
BASOPHILS RELATIVE PERCENT: 0.7 %
EOSINOPHILS ABSOLUTE COUNT: 0.1 10*9/L (ref 0.0–0.7)
EOSINOPHILS RELATIVE PERCENT: 6 %
HEMATOCRIT: 30.1 % — ABNORMAL LOW (ref 38.0–50.0)
HEMOGLOBIN: 10.4 g/dL — ABNORMAL LOW (ref 13.5–17.5)
LYMPHOCYTES ABSOLUTE COUNT: 0.9 10*9/L (ref 0.7–4.0)
LYMPHOCYTES RELATIVE PERCENT: 39.8 %
MEAN CORPUSCULAR HEMOGLOBIN CONC: 34.4 g/dL (ref 30.0–36.0)
MEAN CORPUSCULAR HEMOGLOBIN: 29.7 pg (ref 26.0–34.0)
MEAN CORPUSCULAR VOLUME: 86.3 fL (ref 81.0–95.0)
MEAN PLATELET VOLUME: 7.1 fL (ref 7.0–10.0)
MONOCYTES ABSOLUTE COUNT: 0.4 10*9/L (ref 0.1–1.0)
MONOCYTES RELATIVE PERCENT: 18.9 %
NEUTROPHILS ABSOLUTE COUNT: 0.8 10*9/L — ABNORMAL LOW (ref 1.7–7.7)
NEUTROPHILS RELATIVE PERCENT: 34.6 %
NUCLEATED RED BLOOD CELLS: 0 /100{WBCs} (ref <=4)
PLATELET COUNT: 267 10*9/L (ref 150–450)
RED BLOOD CELL COUNT: 3.49 10*12/L — ABNORMAL LOW (ref 4.32–5.72)
RED CELL DISTRIBUTION WIDTH: 13.3 % (ref 12.0–15.0)
WBC ADJUSTED: 2.3 10*9/L — ABNORMAL LOW (ref 3.5–10.5)

## 2020-06-06 LAB — PHOSPHORUS: PHOSPHORUS: 4.2 mg/dL (ref 2.4–5.1)

## 2020-06-06 LAB — HEPATIC FUNCTION PANEL
ALBUMIN: 3.1 g/dL — ABNORMAL LOW (ref 3.4–5.0)
ALKALINE PHOSPHATASE: 254 U/L — ABNORMAL HIGH (ref 46–116)
ALT (SGPT): 218 U/L — ABNORMAL HIGH (ref 10–49)
AST (SGOT): 71 U/L — ABNORMAL HIGH (ref <=34)
BILIRUBIN DIRECT: 0.1 mg/dL (ref 0.00–0.30)
BILIRUBIN TOTAL: 0.3 mg/dL (ref 0.3–1.2)
PROTEIN TOTAL: 6.4 g/dL (ref 5.7–8.2)

## 2020-06-06 LAB — MAGNESIUM: MAGNESIUM: 1.7 mg/dL (ref 1.6–2.6)

## 2020-06-07 MED ORDER — PREDNISONE 5 MG TABLET
ORAL_TABLET | Freq: Every day | ORAL | 11 refills | 30 days | Status: CP
Start: 2020-06-07 — End: ?
  Filled 2020-06-14: qty 30, 30d supply, fill #0

## 2020-06-07 NOTE — Unmapped (Signed)
Patient has requested a medication refill via EPIC

## 2020-06-07 NOTE — Unmapped (Signed)
[facility] Refill Coordination Note    Specialty Medication(s) to be Shipped:   Transplant: Myfortic 180mg  and Prednisone 5mg     Other medication(s) to be shipped: No additional medications requested for fill at this time     Ryan Moon, DOB: Aug 12, 1985  Phone: [phone] (home)       All above HIPAA information was verified with patient.     Was a Nurse, learning disability used for this call? No    Completed refill call assessment today to schedule patient's medication shipment from the [facility] [phone]).       Specialty medication(s) and dose(s) confirmed: Regimen is correct and unchanged.   Changes to medications: Ryan Moon reports no changes at this time.  Changes to insurance: No  Questions for the pharmacist: No    Confirmed patient received Welcome Packet with first shipment. The patient will receive a drug information handout for each medication shipped and additional FDA Medication Guides as required.       DISEASE/MEDICATION-SPECIFIC INFORMATION        N/A    SPECIALTY MEDICATION ADHERENCE     Medication Adherence    Patient reported X missed doses in the last month: 0            Myfortic 180mg : 9 days worth of medication on hand.  Prednisone 5mg : 9 days worth of medication on hand.            SHIPPING     Shipping address confirmed in Epic.     Delivery Scheduled: Yes, Expected medication delivery date: 06/10/20.     Medication will be delivered via UPS to the prescription address in Epic WAM.    Ryan Moon   [facility] Specialty Technician

## 2020-06-08 DIAGNOSIS — R945 Abnormal results of liver function studies: Principal | ICD-10-CM

## 2020-06-09 DIAGNOSIS — Z940 Kidney transplant status: Principal | ICD-10-CM

## 2020-06-10 NOTE — Unmapped (Signed)
Ryan Moon 's Myfortic and Prednisone shipment will be delayed as a result of prior authorization being required by the patient's insurance.     I have reached out to the patient and communicated the delay. We will call the patient back to reschedule the delivery upon resolution. We have not confirmed the new delivery date.

## 2020-06-13 DIAGNOSIS — Z79899 Other long term (current) drug therapy: Principal | ICD-10-CM

## 2020-06-13 DIAGNOSIS — Z940 Kidney transplant status: Principal | ICD-10-CM

## 2020-06-13 NOTE — Unmapped (Signed)
Pt AST and ALT elevated on recent labs. Reviewed with Dr. Toni Arthurs, he would like LFTs, HAV ab, HBV surface ag/ab, HBV core ab, HCV ab order and drawn this week. He would also like +right upper quadrant ultrasound for evaluation of abnormal LFTs.     [place] scheduled for Wednesday 1/12 at 8am. Pt will need to be NPO 6 hrs before [place].     Called pt reviewed plan, pt states only thing different he has been doing is using nicotine patch and he recently stopped using the patch. Pt ok with having [place] before Belatacept infusion on Wednesday.

## 2020-06-14 MED FILL — MYFORTIC 180 MG TABLET,DELAYED RELEASE: ORAL | 30 days supply | Qty: 60 | Fill #5

## 2020-06-14 NOTE — Unmapped (Signed)
Medication delivery rescheduled for 1/12

## 2020-06-14 NOTE — Unmapped (Signed)
Ryan Moon 's Myfortic and prednisone shipment will be delayed as a result of prior authorization now approved.     I have reached out to the patient and left a voicemail message.  We will not reschedule the medication and have removed this/these medication(s) from the work request.  We have not confirmed the new delivery date.

## 2020-06-15 ENCOUNTER — Ambulatory Visit: Admit: 2020-06-15 | Discharge: 2020-06-16 | Payer: PRIVATE HEALTH INSURANCE

## 2020-06-15 ENCOUNTER — Encounter: Admit: 2020-06-15 | Discharge: 2020-06-16 | Payer: PRIVATE HEALTH INSURANCE

## 2020-06-15 DIAGNOSIS — Z20822 Contact with and (suspected) exposure to covid-19: Principal | ICD-10-CM

## 2020-06-15 DIAGNOSIS — Z4822 Encounter for aftercare following kidney transplant: Principal | ICD-10-CM

## 2020-06-15 DIAGNOSIS — Z940 Kidney transplant status: Principal | ICD-10-CM

## 2020-06-15 DIAGNOSIS — R945 Abnormal results of liver function studies: Principal | ICD-10-CM

## 2020-06-15 DIAGNOSIS — Z79899 Other long term (current) drug therapy: Principal | ICD-10-CM

## 2020-06-15 LAB — HEPATITIS B CORE ANTIBODY, TOTAL: HEPATITIS B CORE TOTAL ANTIBODY: NONREACTIVE

## 2020-06-15 LAB — COMPREHENSIVE METABOLIC PANEL
ALBUMIN: 3.3 g/dL — ABNORMAL LOW (ref 3.4–5.0)
ALKALINE PHOSPHATASE: 206 U/L — ABNORMAL HIGH (ref 46–116)
ALT (SGPT): 227 U/L — ABNORMAL HIGH (ref 10–49)
ANION GAP: 5 mmol/L (ref 5–14)
AST (SGOT): 74 U/L — ABNORMAL HIGH (ref <=34)
BILIRUBIN TOTAL: 0.3 mg/dL (ref 0.3–1.2)
BLOOD UREA NITROGEN: 24 mg/dL — ABNORMAL HIGH (ref 9–23)
BUN / CREAT RATIO: 16
CALCIUM: 9.5 mg/dL (ref 8.7–10.4)
CHLORIDE: 109 mmol/L — ABNORMAL HIGH (ref 98–107)
CO2: 25 mmol/L (ref 20.0–31.0)
CREATININE: 1.51 mg/dL — ABNORMAL HIGH
EGFR CKD-EPI AA MALE: 68 mL/min/{1.73_m2} (ref >=60)
EGFR CKD-EPI NON-AA MALE: 59 mL/min/{1.73_m2} — ABNORMAL LOW (ref >=60)
GLUCOSE RANDOM: 98 mg/dL (ref 70–99)
POTASSIUM: 4.5 mmol/L (ref 3.4–4.5)
PROTEIN TOTAL: 6.7 g/dL (ref 5.7–8.2)
SODIUM: 139 mmol/L (ref 135–145)

## 2020-06-15 LAB — CBC W/ AUTO DIFF
BASOPHILS ABSOLUTE COUNT: 0 10*9/L (ref 0.0–0.1)
BASOPHILS RELATIVE PERCENT: 1.5 %
EOSINOPHILS ABSOLUTE COUNT: 0.2 10*9/L (ref 0.0–0.4)
EOSINOPHILS RELATIVE PERCENT: 6.9 %
HEMATOCRIT: 31.6 % — ABNORMAL LOW (ref 41.0–53.0)
HEMOGLOBIN: 10.7 g/dL — ABNORMAL LOW (ref 13.5–17.5)
LARGE UNSTAINED CELLS: 2 % (ref 0–4)
LYMPHOCYTES ABSOLUTE COUNT: 0.6 10*9/L — ABNORMAL LOW (ref 1.5–5.0)
LYMPHOCYTES RELATIVE PERCENT: 24.3 %
MEAN CORPUSCULAR HEMOGLOBIN CONC: 33.8 g/dL (ref 31.0–37.0)
MEAN CORPUSCULAR HEMOGLOBIN: 30.1 pg (ref 26.0–34.0)
MEAN CORPUSCULAR VOLUME: 89.1 fL (ref 80.0–100.0)
MEAN PLATELET VOLUME: 8.5 fL (ref 7.0–10.0)
MONOCYTES ABSOLUTE COUNT: 0.3 10*9/L (ref 0.2–0.8)
MONOCYTES RELATIVE PERCENT: 10.2 %
NEUTROPHILS ABSOLUTE COUNT: 1.4 10*9/L — ABNORMAL LOW (ref 2.0–7.5)
NEUTROPHILS RELATIVE PERCENT: 54.9 %
PLATELET COUNT: 275 10*9/L (ref 150–440)
RED BLOOD CELL COUNT: 3.54 10*12/L — ABNORMAL LOW (ref 4.50–5.90)
RED CELL DISTRIBUTION WIDTH: 13.4 % (ref 12.0–15.0)
WBC ADJUSTED: 2.6 10*9/L — ABNORMAL LOW (ref 4.5–11.0)

## 2020-06-15 LAB — PROTEIN / CREATININE RATIO, URINE
CREATININE, URINE: 33 mg/dL
PROTEIN URINE: 13.4 mg/dL
PROTEIN/CREAT RATIO, URINE: 0.406

## 2020-06-15 LAB — URINALYSIS
BACTERIA: NONE SEEN /HPF
BILIRUBIN UA: NEGATIVE
BLOOD UA: NEGATIVE
GLUCOSE UA: NEGATIVE
KETONES UA: NEGATIVE
LEUKOCYTE ESTERASE UA: NEGATIVE
NITRITE UA: NEGATIVE
PH UA: 7 (ref 5.0–9.0)
PROTEIN UA: NEGATIVE
RBC UA: <1 /HPF (ref <=3)
SPECIFIC GRAVITY UA: 1.008 (ref 1.003–1.030)
SQUAMOUS EPITHELIAL: <1 /HPF (ref 0–5)
UROBILINOGEN UA: 0.2
WBC UA: <1 /HPF (ref <=2)

## 2020-06-15 LAB — HEPATITIS B SURFACE ANTIBODY
HEPATITIS B SURFACE ANTIBODY QUANT: 79.02 m[IU]/mL — ABNORMAL HIGH (ref <8.00)
HEPATITIS B SURFACE ANTIBODY: REACTIVE — AB

## 2020-06-15 LAB — MAGNESIUM: MAGNESIUM: 1.6 mg/dL (ref 1.6–2.6)

## 2020-06-15 LAB — SLIDE REVIEW

## 2020-06-15 LAB — COVID-19 IGG SPIKE PROTEIN: COVID-19 IGG: NEGATIVE

## 2020-06-15 LAB — HEPATITIS B SURFACE ANTIGEN: HEPATITIS B SURFACE ANTIGEN: NONREACTIVE

## 2020-06-15 LAB — HEPATITIS C ANTIBODY: HEPATITIS C ANTIBODY: NONREACTIVE

## 2020-06-15 LAB — GAMMA GT: GAMMA GLUTAMYL TRANSFERASE: 63 U/L

## 2020-06-15 LAB — PHOSPHORUS: PHOSPHORUS: 3.6 mg/dL (ref 2.4–5.1)

## 2020-06-15 MED ADMIN — belatacept (NULOJIX) 387.5 mg in sodium chloride (NS) 0.9 % 100 mL IVPB: 5 mg/kg | INTRAVENOUS | @ 15:00:00 | Stop: 2020-06-15

## 2020-06-15 NOTE — Unmapped (Signed)
Urine was collected and sent to the lab.

## 2020-06-15 NOTE — Unmapped (Signed)
Addended by: Debe Coder on: 06/15/2020 11:33 AM     Modules accepted: Orders

## 2020-06-15 NOTE — Unmapped (Signed)
0930 Patient arrived to the Transplant Infusion Room today for belatacept 387.5mg , Condition: well; Mobility: ambulating; accompanied by self.   See Flowsheet and MAR for all details of visit.   0935 VS stable.  0940 PIV placed and secured with coban; labs collected and sent, urine collected and sent.  0946 Infusion initiated.  1018 infusion complete.   Pt reported having symptoms of Covid19 about a month ago, both he and his immediate family.  Ordered/sent Covid IgG spike Ab to assess for antibodies per MD Toni Arthurs and pt request.   1020 VS stable, Line flushed with NS, PIV removed and secured with coban. Patient left clinic today, Condition: well; Mobility: ambulating; accompanied by self.

## 2020-06-16 LAB — HEPATITIS A ANTIBODY, IGM: HEPATITIS A IGM ANTIBODY: NONREACTIVE

## 2020-06-16 LAB — HEPATITIS A IGG: HEPATITIS A IGG: NONREACTIVE

## 2020-06-21 ENCOUNTER — Encounter
Admit: 2020-06-21 | Discharge: 2020-06-22 | Payer: PRIVATE HEALTH INSURANCE | Attending: Physician Assistant | Primary: Physician Assistant

## 2020-06-21 DIAGNOSIS — Z940 Kidney transplant status: Principal | ICD-10-CM

## 2020-06-21 DIAGNOSIS — Z20822 Contact with and (suspected) exposure to covid-19: Principal | ICD-10-CM

## 2020-06-21 DIAGNOSIS — Z20828 Contact with and (suspected) exposure to other viral communicable diseases: Principal | ICD-10-CM

## 2020-06-21 DIAGNOSIS — Z114 Encounter for screening for human immunodeficiency virus [HIV]: Principal | ICD-10-CM

## 2020-06-21 DIAGNOSIS — Z79899 Other long term (current) drug therapy: Principal | ICD-10-CM

## 2020-06-21 NOTE — Unmapped (Signed)
Assessment     Ryan Moon is a 35 y.o. male presenting to [facility] for COVID testing.     Problem List Items Addressed This Visit     None      Visit Diagnoses     Contact with and (suspected) exposure to covid-19    -  Primary    Relevant Orders    Respiratory Pathogen Panel with COVID-19    Contact with and (suspected) exposure to other viral communicable diseases        Relevant Orders    Respiratory Pathogen Panel with COVID-19          Plan     If no testing performed, pt counseled on routine care for respiratory illness.  If testing performed, COVID sent.  Patient directed to Home given findings during today's visit.    Subjective     Ryan Moon is a 35 y.o. male who presents to the [facility] with complaints of the following:    Exposure History: In the last 21 days?     Have you traveled outside of [place]? No               Have you been in close contact with someone confirmed by a test to have COVID? (Close contact is within 6 feet for at least 10 minutes) Yes, Who? 0       Have you worked in a health care facility? No     Lived or worked facility like a nursing home, group home, or assisted living?    No         Are you scheduled to have surgery or a procedure in the next 3 days? No               Are you scheduled to receive cancer chemotherapy within the next 7 days?    No     Have you ever been tested before for COVID-19 with a swab of your nose? 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 1   Muscle aches Yes, how many days? 1   Runny nose Yes, how many days? 1   Sore throat Yes, how many days? 1   Loss of taste or smell No   Cough (new onset or worsening of chronic cough) Yes, how many days? 1   Shortness of breath No   Nausea or vomiting No   Headache Yes, how many days? 1   Abdominal Pain No   Diarrhea (3 or more loose stools in last 24 hours) No     History/Medical Conditions (as stated by patient on intake form):    Do you have any of the following:   Asthma or emphysema or COPD No   Cystic Fibrosis No   Diabetes No   High Blood Pressure  Yes   Cardiovascular Disease No   Chronic Kidney Disease No   Chronic Liver Disease No   Chronic blood disorder like Sickle Cell Disease  No   Weak immune system due to disease or medication Yes  Neurologic condition that limits movement  No   Developmental delay - Moderate to Severe  No   Recent (within past 2 weeks) or current Pregnancy No   Morbid Obesity (>100 pounds over ideal weight) No   Current Smoker No   Former Smoker Yes       Objective     Given above, testing performed: Yes    Testing Performed:  Test Specimen Type Sent to   COVID-19  NP Swab [place] [facility]       Scribe's Attestation: [place], [place] obtained and performed the history, physical exam and medical decision making elements that were entered into the chart.  Signed by Georgette Shell serving as Scribe, on 06/21/2020 1:53 PM      The documentation recorded by the scribe accurately reflects the service I personally performed and the decisions made by me. Dimas Scheck F. Latanya Maudlin  June 21, 2020 2:06 PM

## 2020-06-21 NOTE — Unmapped (Signed)
Pt called, everyone in his immediate family has tested positive for COVID-19. Pt states he has congestion, cough and nausea/diarrhea x 1 this morning. Symptoms started this morning. Pt to get COVID-19 test today, provided [facility] testing phone number and other options in [place], [place]. Pt had testing completed at [facility] today.     Referral for Hepatology entered to evaluate elevated LFTs per Dr. Toni Arthurs. Pt made aware, verbalized understanding.

## 2020-06-22 NOTE — Unmapped (Signed)
Pt COVID-19 test results negative from yesterday. Per Dr. Toni Arthurs, pt to [facility] Myfortic x 7 days. Pt made aware. If starts to feel worse he should be retested.     Also received notice pt insurance needed to be updated with [facility] for Belatacept infusion. Called [place], provided updated information and faxed over copy of insurance card.

## 2020-06-30 ENCOUNTER — Encounter: Admit: 2020-06-30 | Discharge: 2020-07-01 | Payer: PRIVATE HEALTH INSURANCE

## 2020-06-30 DIAGNOSIS — Z79899 Other long term (current) drug therapy: Principal | ICD-10-CM

## 2020-06-30 DIAGNOSIS — Z940 Kidney transplant status: Principal | ICD-10-CM

## 2020-06-30 LAB — CBC W/ AUTO DIFF
BASOPHILS ABSOLUTE COUNT: 0.1 10*9/L (ref 0.0–0.1)
BASOPHILS RELATIVE PERCENT: 1 %
EOSINOPHILS ABSOLUTE COUNT: 0.1 10*9/L (ref 0.0–0.7)
EOSINOPHILS RELATIVE PERCENT: 2 %
HEMATOCRIT: 35.7 % — ABNORMAL LOW (ref 38.0–50.0)
HEMOGLOBIN: 12.3 g/dL — ABNORMAL LOW (ref 13.5–17.5)
LYMPHOCYTES ABSOLUTE COUNT: 1.7 10*9/L (ref 0.7–4.0)
LYMPHOCYTES RELATIVE PERCENT: 29.1 %
MEAN CORPUSCULAR HEMOGLOBIN CONC: 34.5 g/dL (ref 30.0–36.0)
MEAN CORPUSCULAR HEMOGLOBIN: 29.5 pg (ref 26.0–34.0)
MEAN CORPUSCULAR VOLUME: 85.5 fL (ref 81.0–95.0)
MEAN PLATELET VOLUME: 7.4 fL (ref 7.0–10.0)
MONOCYTES ABSOLUTE COUNT: 0.5 10*9/L (ref 0.1–1.0)
MONOCYTES RELATIVE PERCENT: 8.2 %
NEUTROPHILS ABSOLUTE COUNT: 3.5 10*9/L (ref 1.7–7.7)
NEUTROPHILS RELATIVE PERCENT: 59.7 %
NUCLEATED RED BLOOD CELLS: 0 /100{WBCs} (ref <=4)
PLATELET COUNT: 262 10*9/L (ref 150–450)
RED BLOOD CELL COUNT: 4.17 10*12/L — ABNORMAL LOW (ref 4.32–5.72)
RED CELL DISTRIBUTION WIDTH: 14.4 % (ref 12.0–15.0)
WBC ADJUSTED: 5.8 10*9/L (ref 3.5–10.5)

## 2020-06-30 LAB — HEPATIC FUNCTION PANEL
ALBUMIN: 4.4 g/dL (ref 3.4–5.0)
ALKALINE PHOSPHATASE: 128 U/L — ABNORMAL HIGH (ref 46–116)
ALT (SGPT): 62 U/L — ABNORMAL HIGH (ref 10–49)
AST (SGOT): 25 U/L (ref <=34)
BILIRUBIN DIRECT: 0.1 mg/dL (ref 0.00–0.30)
BILIRUBIN TOTAL: 0.4 mg/dL (ref 0.3–1.2)
PROTEIN TOTAL: 7.9 g/dL (ref 5.7–8.2)

## 2020-06-30 LAB — MAGNESIUM: MAGNESIUM: 1.7 mg/dL (ref 1.6–2.6)

## 2020-06-30 LAB — BASIC METABOLIC PANEL
ANION GAP: 5 mmol/L (ref 5–14)
BLOOD UREA NITROGEN: 23 mg/dL (ref 9–23)
BUN / CREAT RATIO: 13
CALCIUM: 10.2 mg/dL (ref 8.7–10.4)
CHLORIDE: 108 mmol/L — ABNORMAL HIGH (ref 98–107)
CO2: 24.8 mmol/L (ref 20.0–31.0)
CREATININE: 1.72 mg/dL — ABNORMAL HIGH
EGFR CKD-EPI AA MALE: 58 mL/min/{1.73_m2} — ABNORMAL LOW (ref >=60)
EGFR CKD-EPI NON-AA MALE: 50 mL/min/{1.73_m2} — ABNORMAL LOW (ref >=60)
GLUCOSE RANDOM: 100 mg/dL (ref 70–179)
POTASSIUM: 4.5 mmol/L (ref 3.4–4.5)
SODIUM: 138 mmol/L (ref 135–145)

## 2020-06-30 LAB — SLIDE REVIEW

## 2020-06-30 LAB — PHOSPHORUS: PHOSPHORUS: 4.2 mg/dL (ref 2.4–5.1)

## 2020-07-04 ENCOUNTER — Encounter: Admit: 2020-07-04 | Discharge: 2020-07-05 | Payer: PRIVATE HEALTH INSURANCE

## 2020-07-04 DIAGNOSIS — Z4822 Encounter for aftercare following kidney transplant: Principal | ICD-10-CM

## 2020-07-04 DIAGNOSIS — Z940 Kidney transplant status: Principal | ICD-10-CM

## 2020-07-04 DIAGNOSIS — Z79899 Other long term (current) drug therapy: Principal | ICD-10-CM

## 2020-07-04 DIAGNOSIS — R748 Abnormal levels of other serum enzymes: Principal | ICD-10-CM

## 2020-07-04 LAB — BASIC METABOLIC PANEL
ANION GAP: 9 mmol/L (ref 5–14)
BLOOD UREA NITROGEN: 26 mg/dL — ABNORMAL HIGH (ref 9–23)
BUN / CREAT RATIO: 16
CALCIUM: 9.8 mg/dL (ref 8.7–10.4)
CHLORIDE: 109 mmol/L — ABNORMAL HIGH (ref 98–107)
CO2: 24 mmol/L (ref 20.0–31.0)
CREATININE: 1.63 mg/dL — ABNORMAL HIGH
EGFR CKD-EPI AA MALE: 62 mL/min/{1.73_m2} (ref >=60)
EGFR CKD-EPI NON-AA MALE: 53 mL/min/{1.73_m2} — ABNORMAL LOW (ref >=60)
GLUCOSE RANDOM: 87 mg/dL (ref 70–179)
POTASSIUM: 3.9 mmol/L (ref 3.4–4.5)
SODIUM: 142 mmol/L (ref 135–145)

## 2020-07-04 LAB — HEPATIC FUNCTION PANEL
ALBUMIN: 4.6 g/dL (ref 3.4–5.0)
ALKALINE PHOSPHATASE: 129 U/L — ABNORMAL HIGH (ref 46–116)
ALT (SGPT): 38 U/L (ref 10–49)
AST (SGOT): 23 U/L (ref <=34)
BILIRUBIN DIRECT: 0.1 mg/dL (ref 0.00–0.30)
BILIRUBIN TOTAL: 0.5 mg/dL (ref 0.3–1.2)
PROTEIN TOTAL: 8 g/dL (ref 5.7–8.2)

## 2020-07-04 LAB — CBC W/ AUTO DIFF
BASOPHILS ABSOLUTE COUNT: 0.1 10*9/L (ref 0.0–0.1)
BASOPHILS RELATIVE PERCENT: 0.9 %
EOSINOPHILS ABSOLUTE COUNT: 0.1 10*9/L (ref 0.0–0.4)
EOSINOPHILS RELATIVE PERCENT: 1.1 %
HEMATOCRIT: 40.1 % — ABNORMAL LOW (ref 41.0–53.0)
HEMOGLOBIN: 13.2 g/dL — ABNORMAL LOW (ref 13.5–17.5)
LARGE UNSTAINED CELLS: 1 % (ref 0–4)
LYMPHOCYTES ABSOLUTE COUNT: 1.7 10*9/L (ref 1.5–5.0)
LYMPHOCYTES RELATIVE PERCENT: 32.4 %
MEAN CORPUSCULAR HEMOGLOBIN CONC: 33 g/dL (ref 31.0–37.0)
MEAN CORPUSCULAR HEMOGLOBIN: 29.4 pg (ref 26.0–34.0)
MEAN CORPUSCULAR VOLUME: 89.2 fL (ref 80.0–100.0)
MEAN PLATELET VOLUME: 8.3 fL (ref 7.0–10.0)
MONOCYTES ABSOLUTE COUNT: 0.2 10*9/L (ref 0.2–0.8)
MONOCYTES RELATIVE PERCENT: 4.6 %
NEUTROPHILS ABSOLUTE COUNT: 3.1 10*9/L (ref 2.0–7.5)
NEUTROPHILS RELATIVE PERCENT: 59.9 %
PLATELET COUNT: 287 10*9/L (ref 150–440)
RED BLOOD CELL COUNT: 4.5 10*12/L (ref 4.50–5.90)
RED CELL DISTRIBUTION WIDTH: 15.3 % — ABNORMAL HIGH (ref 12.0–15.0)
WBC ADJUSTED: 5.2 10*9/L (ref 4.5–11.0)

## 2020-07-04 LAB — IGG: GAMMAGLOBULIN; IGG: 1095 mg/dL (ref 646–2013)

## 2020-07-04 LAB — MAGNESIUM: MAGNESIUM: 1.9 mg/dL (ref 1.6–2.6)

## 2020-07-04 LAB — ALPHA-1-ANTITRYPSIN: ALPHA-1 ANTITRYPSIN: 130 mg/dL (ref 78–200)

## 2020-07-04 LAB — CERULOPLASMIN: CERULOPLASMIN: 37 mg/dL (ref 15.0–52.0)

## 2020-07-04 LAB — PHOSPHORUS: PHOSPHORUS: 3.9 mg/dL (ref 2.4–5.1)

## 2020-07-04 NOTE — Unmapped (Signed)
[facility] Refill Coordination Note    Specialty Medication(s) to be Shipped:   Transplant: Myfortic 180mg  and Prednisone 5mg     Other medication(s) to be shipped: No additional medications requested for fill at this time     Ryan Moon, DOB: 1985-06-29  Phone: [phone] (home)       All above HIPAA information was verified with patient.     Was a Nurse, learning disability used for this call? No    Completed refill call assessment today to schedule patient's medication shipment from the [facility] [phone]).       Specialty medication(s) and dose(s) confirmed: Regimen is correct and unchanged.   Changes to medications: Ryan Moon reports no changes at this time.  Changes to insurance: No  Questions for the pharmacist: No    Confirmed patient received Welcome Packet with first shipment. The patient will receive a drug information handout for each medication shipped and additional FDA Medication Guides as required.       DISEASE/MEDICATION-SPECIFIC INFORMATION        N/A    SPECIALTY MEDICATION ADHERENCE     Medication Adherence    Patient reported X missed doses in the last month: 0        Myfortic 180mg  10 days worth of medication on hand.  Prednisone 5mg   10 days worth of medication on hand.        SHIPPING     Shipping address confirmed in Epic.     Delivery Scheduled: Yes, Expected medication delivery date: 07/12/20.     Medication will be delivered via UPS to the prescription address in Epic WAM.    Ryan Moon   [facility] Specialty Technician

## 2020-07-04 NOTE — Unmapped (Signed)
Thank you for allowing me to participate in your medical care today. Here are my recommendations based on today's visit:    ??? Get labs drawn today.  ??? We will continue to follow your labs until back to normal.  ??? If they don't go back to normal, we can discuss next steps including possibly a liver biopsy.    Take care,  Marquita Palms, MD

## 2020-07-05 LAB — VARICELLA ZOSTER PCR, BLOOD: VZV PCR, BLOOD: NEGATIVE

## 2020-07-05 LAB — ANA: ANTINUCLEAR ANTIBODIES (ANA): NEGATIVE

## 2020-07-05 LAB — HEPATITIS B CORE ANTIBODY, IGM: HEPATITIS B CORE IGM ANTIBODY: NONREACTIVE

## 2020-07-05 NOTE — Unmapped (Addendum)
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 Differential diagnosis includes viral infection (symptoms of congestion and sinus pressure in late December) and drug-induced liver injury, although a specific culprit medication is not readily apparent. Liver biochemistries are improving spontaneously; therefore, we will continue to monitor them without invasive diagnostic testing at this point. If they do not continue to improve, we can consider a liver biopsy.    Elevated liver enzymes  - Anti-Nuclear Antibody (ANA); Future  - Anti-Smooth Muscle Antibody, IgG; Future  - IgG; Future  - Alpha-1-Antitrypsin; Future  - Ceruloplasmin; Future  - Hepatitis B Core Antibody, IgM; Future  - HSV PCR  - Varicella Zoster Virus (VZV) PCR, Blood; Future  - Monitor liver biochemistries until normal.  - Consider liver biopsy if they do not return to normal.    Follow-up has not been arranged but I am happy to see him again as needed.    Subjective   Ryan Moon is a 35 y.o. male seen in consultation at the request of Griffin Dakin for evaluation of Elevated liver enzymes [R74.8].    Information Source   History was obtained from the patient and complete review of the patient's medical records. I have personally reviewed records from the [facility] electronic health record. These are summarized within the history of present illness.    History of Present Illness   He is a 35 y.o. 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 Liver tests were normal in September 2021 and elevated in mid-December 2021. They appear to have peaked in mid-January (total bilirubin 0.3, AST 74, ALT 227, alkaline phosphatase 206) and were markedly better last week. He never had jaundice. He recalls having a head cold consisting of congestion and sinus pressure at the end of December/beginning of January. He took Mucinex for 2 days. Other medication changes include stopping Lexapro and Buspirone about 4 months ago (he started both about a year ago), and dosage changes to Losartan (25 mg to 50 mg daily a few months ago) and amlodipine (10 mg to 5 mg recently). Recent viral serologies were negative along with negative PCRs for CMV, EBV, and SARS-CoV-2. Ultrasound 06/15/2020 was normal. He feels well today.    Past medical history: As above.  Social history: Married, daughter. No alcohol or other recreational drug use. 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  Family history: No liver disease.  Review of systems: As per HPI. A complete review of systems is otherwise negative.    Objective   Physical Exam   Vital Signs: BP 127/87 (BP Site: R Arm, BP Position: Sitting)  - Pulse 75  - Temp 36.6 ??C (97.8 ??F) (Tympanic)  - Ht 182.9 cm (6' 0.01)  - Wt 82.5 kg (181 lb 12.8 oz)  - SpO2 98%  - BMI 24.65 kg/m??    Constitutional: He is in no apparent distress.   Eyes: Anicteric sclerae.   Cardiovascular: No peripheral edema.   Gastrointestinal: Soft, nontender abdomen without hepatosplenomegaly, hernias or masses.   Hem/Lymphatic: No cervical or supraclavicular lymphadenopathy.   Musculoskeletal:  No clubbing or cyanosis of hands. Normal range of motion in upper and lower extremities.   Skin: No spider angiomata, palmar erythema, rashes.   Neurologic: Nonfocal, no asterixis.  Psychiatric: Alert and oriented to person, place and time. Normal affect.       Labs   Office Visit on 07/04/2020   Component Date Value   ??? Ceruloplasmin 07/04/2020 37.0    ??? A-1 Antitrypsin 07/04/2020 130    ??? Total IgG 07/04/2020 1,095    ??? Albumin 07/04/2020 4.6    ??? Total Protein 07/04/2020 8.0    ??? Total Bilirubin 07/04/2020 0.5    ??? Bilirubin, Direct 07/04/2020 0.10    ??? AST 07/04/2020 23    ??? ALT 07/04/2020 38    ??? Alkaline Phosphatase 07/04/2020 129*   ??? Sodium 07/04/2020 142    ??? Potassium 07/04/2020 3.9    ??? Chloride 07/04/2020 109*   ??? CO2 07/04/2020 24.0    ??? Anion Gap 07/04/2020 9    ??? BUN 07/04/2020 26*   ??? Creatinine 07/04/2020 1.63*   ??? BUN/Creatinine Ratio 07/04/2020 16    ??? EGFR CKD-EPI Non-African* 07/04/2020 53*   ??? EGFR CKD-EPI African Ame* 07/04/2020 62    ??? Glucose 07/04/2020 87    ??? Calcium 07/04/2020 9.8    ??? Magnesium 07/04/2020 1.9    ??? Phosphorus 07/04/2020 3.9    ??? WBC 07/04/2020 5.2    ??? RBC 07/04/2020 4.50    ??? HGB 07/04/2020 13.2*   ??? HCT 07/04/2020 40.1*   ??? MCV 07/04/2020 89.2    ??? [facility] 07/04/2020 29.4    ??? MCHC 07/04/2020 33.0    ??? RDW 07/04/2020 15.3*   ??? MPV 07/04/2020 8.3    ??? Platelet 07/04/2020 287    ??? Neutrophils % 07/04/2020 59.9    ??? Lymphocytes % 07/04/2020 32.4    ??? Monocytes % 07/04/2020 4.6    ??? Eosinophils % 07/04/2020 1.1    ??? Basophils % 07/04/2020 0.9    ??? Neutrophil Left Shift 07/04/2020 1+*   ??? Absolute Neutrophils 07/04/2020 3.1    ??? Absolute Lymphocytes 07/04/2020 1.7    ??? Absolute Monocytes 07/04/2020 0.2    ??? Absolute Eosinophils 07/04/2020 0.1    ??? Absolute Basophils 07/04/2020 0.1    ??? Large Unstained Cells 07/04/2020 1        Studies   Ultrasound 06/15/2020:  No gallstones or biliary dilatation.     Lovena Neighbours, MD, MSc  Professor of Medicine  Medical Director of Liver Transplantation    ADDENDUM:   07/04/2020: ANA negative, ASMA negative, anti-HBc IgM negative, HSV PCR negative, VZV PCR negative    07/13/2020: total bilirubin 0.3, AST 29, ALT 41, alkaline phosphatase 110    Weekly labs since 07/19/2020 show persistently normal liver biochemistries. No need to continue monitoring from a liver perspective.

## 2020-07-06 DIAGNOSIS — Z940 Kidney transplant status: Principal | ICD-10-CM

## 2020-07-06 DIAGNOSIS — R748 Abnormal levels of other serum enzymes: Principal | ICD-10-CM

## 2020-07-06 DIAGNOSIS — Z79899 Other long term (current) drug therapy: Principal | ICD-10-CM

## 2020-07-06 LAB — ANTI-SMOOTH MUSCLE ANTIBODY, IGG: SMOOTH MUSCLE ANTIBODY: NEGATIVE

## 2020-07-06 MED FILL — AMLODIPINE 5 MG TABLET: ORAL | 30 days supply | Qty: 30 | Fill #1

## 2020-07-06 MED FILL — MG-PLUS-PROTEIN 133 MG TABLET: ORAL | 100 days supply | Qty: 100 | Fill #4

## 2020-07-06 MED FILL — LOSARTAN 50 MG TABLET: ORAL | 30 days supply | Qty: 60 | Fill #1

## 2020-07-06 MED FILL — OMEPRAZOLE 20 MG CAPSULE,DELAYED RELEASE: ORAL | 30 days supply | Qty: 30 | Fill #4

## 2020-07-06 MED FILL — ASPIRIN 81 MG TABLET,DELAYED RELEASE: ORAL | 90 days supply | Qty: 90 | Fill #1

## 2020-07-06 NOTE — Unmapped (Signed)
Called patient to discuss standing orders for HFP to be done every 2 weeks.  Patient is currently getting weekly labs r/t renal transplant. He usually goes to [facility] to get these drawn. Will send order for Q 2 week HFP to be added to existing order.

## 2020-07-09 MED ORDER — ESCITALOPRAM 10 MG TABLET
Freq: Every day | ORAL | 0 days
Start: 2020-07-09 — End: ?

## 2020-07-11 MED FILL — MYFORTIC 180 MG TABLET,DELAYED RELEASE: ORAL | 30 days supply | Qty: 60 | Fill #6

## 2020-07-11 MED FILL — PREDNISONE 5 MG TABLET: ORAL | 30 days supply | Qty: 30 | Fill #1

## 2020-07-13 ENCOUNTER — Encounter: Admit: 2020-07-13 | Discharge: 2020-07-14 | Payer: PRIVATE HEALTH INSURANCE

## 2020-07-13 DIAGNOSIS — Z79899 Other long term (current) drug therapy: Principal | ICD-10-CM

## 2020-07-13 DIAGNOSIS — Z940 Kidney transplant status: Principal | ICD-10-CM

## 2020-07-13 DIAGNOSIS — R748 Abnormal levels of other serum enzymes: Principal | ICD-10-CM

## 2020-07-13 LAB — BASIC METABOLIC PANEL
ANION GAP: 6 mmol/L (ref 5–14)
BLOOD UREA NITROGEN: 23 mg/dL (ref 9–23)
BUN / CREAT RATIO: 14
CALCIUM: 9.5 mg/dL (ref 8.7–10.4)
CHLORIDE: 107 mmol/L (ref 98–107)
CO2: 22 mmol/L (ref 20.0–31.0)
CREATININE: 1.7 mg/dL — ABNORMAL HIGH
EGFR CKD-EPI AA MALE: 59 mL/min/{1.73_m2} — ABNORMAL LOW (ref >=60)
EGFR CKD-EPI NON-AA MALE: 51 mL/min/{1.73_m2} — ABNORMAL LOW (ref >=60)
GLUCOSE RANDOM: 95 mg/dL (ref 70–179)
POTASSIUM: 3.9 mmol/L (ref 3.4–4.5)
SODIUM: 135 mmol/L (ref 135–145)

## 2020-07-13 LAB — HEPATIC FUNCTION PANEL
ALBUMIN: 4 g/dL (ref 3.4–5.0)
ALKALINE PHOSPHATASE: 110 U/L (ref 46–116)
ALT (SGPT): 41 U/L (ref 10–49)
AST (SGOT): 29 U/L (ref <=34)
BILIRUBIN DIRECT: <0.1 mg/dL (ref 0.00–0.30)
BILIRUBIN TOTAL: 0.3 mg/dL (ref 0.3–1.2)
PROTEIN TOTAL: 6.8 g/dL (ref 5.7–8.2)

## 2020-07-13 LAB — CBC W/ AUTO DIFF
BASOPHILS ABSOLUTE COUNT: 0 10*9/L (ref 0.0–0.1)
BASOPHILS RELATIVE PERCENT: 0.6 %
EOSINOPHILS ABSOLUTE COUNT: 0.2 10*9/L (ref 0.0–0.4)
EOSINOPHILS RELATIVE PERCENT: 4.9 %
HEMATOCRIT: 35.5 % — ABNORMAL LOW (ref 41.0–53.0)
HEMOGLOBIN: 11.9 g/dL — ABNORMAL LOW (ref 13.5–17.5)
LARGE UNSTAINED CELLS: 2 % (ref 0–4)
LYMPHOCYTES ABSOLUTE COUNT: 0.9 10*9/L — ABNORMAL LOW (ref 1.5–5.0)
LYMPHOCYTES RELATIVE PERCENT: 27.5 %
MEAN CORPUSCULAR HEMOGLOBIN CONC: 33.5 g/dL (ref 31.0–37.0)
MEAN CORPUSCULAR HEMOGLOBIN: 30.2 pg (ref 26.0–34.0)
MEAN CORPUSCULAR VOLUME: 90.4 fL (ref 80.0–100.0)
MEAN PLATELET VOLUME: 8.5 fL (ref 7.0–10.0)
MONOCYTES ABSOLUTE COUNT: 0.2 10*9/L (ref 0.2–0.8)
MONOCYTES RELATIVE PERCENT: 6.4 %
NEUTROPHILS ABSOLUTE COUNT: 2 10*9/L (ref 2.0–7.5)
NEUTROPHILS RELATIVE PERCENT: 59 %
PLATELET COUNT: 222 10*9/L (ref 150–440)
RED BLOOD CELL COUNT: 3.93 10*12/L — ABNORMAL LOW (ref 4.50–5.90)
RED CELL DISTRIBUTION WIDTH: 15.8 % — ABNORMAL HIGH (ref 12.0–15.0)
WBC ADJUSTED: 3.4 10*9/L — ABNORMAL LOW (ref 4.5–11.0)

## 2020-07-13 LAB — SLIDE REVIEW

## 2020-07-13 LAB — PHOSPHORUS: PHOSPHORUS: 2.5 mg/dL (ref 2.4–5.1)

## 2020-07-13 LAB — MAGNESIUM: MAGNESIUM: 1.7 mg/dL (ref 1.6–2.6)

## 2020-07-13 MED ADMIN — belatacept (NULOJIX) 412.5 mg in sodium chloride (NS) 0.9 % 100 mL IVPB: 5 mg/kg | INTRAVENOUS | @ 15:00:00 | Stop: 2020-07-13

## 2020-07-13 NOTE — Unmapped (Signed)
1610 Patient arrived to the Transplant Infusion Room today for belatacept 412.5mg , Condition: well; Mobility: ambulating; accompanied by self.   See Flowsheet and MAR for all details of visit.   0948 VS stable.  0955 PIV placed and secured with coban; labs collected and sent, urine no orders found  1001 Infusion initiated.  1033 Infusion complete.   1036 VS stable, Line flushed with NS, PIV removed and secured with coban. Patient left clinic today, Condition: well; Mobility: ambulating; accompanied by self.

## 2020-07-19 ENCOUNTER — Encounter: Admit: 2020-07-19 | Discharge: 2020-07-20 | Payer: PRIVATE HEALTH INSURANCE

## 2020-07-19 DIAGNOSIS — Z79899 Other long term (current) drug therapy: Principal | ICD-10-CM

## 2020-07-19 DIAGNOSIS — Z940 Kidney transplant status: Principal | ICD-10-CM

## 2020-07-19 LAB — CBC W/ AUTO DIFF
BASOPHILS ABSOLUTE COUNT: 0 10*9/L (ref 0.0–0.1)
BASOPHILS RELATIVE PERCENT: 0.6 %
EOSINOPHILS ABSOLUTE COUNT: 0.2 10*9/L (ref 0.0–0.7)
EOSINOPHILS RELATIVE PERCENT: 4.9 %
HEMATOCRIT: 36.2 % — ABNORMAL LOW (ref 38.0–50.0)
HEMOGLOBIN: 12.2 g/dL — ABNORMAL LOW (ref 13.5–17.5)
LYMPHOCYTES ABSOLUTE COUNT: 1.4 10*9/L (ref 0.7–4.0)
LYMPHOCYTES RELATIVE PERCENT: 43.3 %
MEAN CORPUSCULAR HEMOGLOBIN CONC: 33.7 g/dL (ref 30.0–36.0)
MEAN CORPUSCULAR HEMOGLOBIN: 29.3 pg (ref 26.0–34.0)
MEAN CORPUSCULAR VOLUME: 87 fL (ref 81.0–95.0)
MEAN PLATELET VOLUME: 7.4 fL (ref 7.0–10.0)
MONOCYTES ABSOLUTE COUNT: 0.3 10*9/L (ref 0.1–1.0)
MONOCYTES RELATIVE PERCENT: 10.6 %
NEUTROPHILS ABSOLUTE COUNT: 1.3 10*9/L — ABNORMAL LOW (ref 1.7–7.7)
NEUTROPHILS RELATIVE PERCENT: 40.6 %
NUCLEATED RED BLOOD CELLS: 0 /100{WBCs} (ref <=4)
PLATELET COUNT: 208 10*9/L (ref 150–450)
RED BLOOD CELL COUNT: 4.17 10*12/L — ABNORMAL LOW (ref 4.32–5.72)
RED CELL DISTRIBUTION WIDTH: 15.6 % — ABNORMAL HIGH (ref 12.0–15.0)
WBC ADJUSTED: 3.2 10*9/L — ABNORMAL LOW (ref 3.5–10.5)

## 2020-07-19 LAB — MAGNESIUM: MAGNESIUM: 2 mg/dL (ref 1.6–2.6)

## 2020-07-19 LAB — HEPATIC FUNCTION PANEL
ALBUMIN: 4.2 g/dL (ref 3.4–5.0)
ALKALINE PHOSPHATASE: 124 U/L — ABNORMAL HIGH (ref 46–116)
ALT (SGPT): 32 U/L (ref 10–49)
AST (SGOT): 26 U/L (ref <=34)
BILIRUBIN DIRECT: 0.1 mg/dL (ref 0.00–0.30)
BILIRUBIN TOTAL: 0.3 mg/dL (ref 0.3–1.2)
PROTEIN TOTAL: 7.4 g/dL (ref 5.7–8.2)

## 2020-07-19 LAB — BASIC METABOLIC PANEL
ANION GAP: 7 mmol/L (ref 5–14)
BLOOD UREA NITROGEN: 24 mg/dL — ABNORMAL HIGH (ref 9–23)
BUN / CREAT RATIO: 16
CALCIUM: 9.8 mg/dL (ref 8.7–10.4)
CHLORIDE: 107 mmol/L (ref 98–107)
CO2: 24.4 mmol/L (ref 20.0–31.0)
CREATININE: 1.53 mg/dL — ABNORMAL HIGH
EGFR CKD-EPI AA MALE: 67 mL/min/{1.73_m2} (ref >=60)
EGFR CKD-EPI NON-AA MALE: 58 mL/min/{1.73_m2} — ABNORMAL LOW (ref >=60)
GLUCOSE RANDOM: 107 mg/dL (ref 70–179)
POTASSIUM: 3.9 mmol/L (ref 3.4–4.5)
SODIUM: 138 mmol/L (ref 135–145)

## 2020-07-19 LAB — PHOSPHORUS: PHOSPHORUS: 3 mg/dL (ref 2.4–5.1)

## 2020-07-28 ENCOUNTER — Encounter: Admit: 2020-07-28 | Discharge: 2020-07-29 | Payer: PRIVATE HEALTH INSURANCE

## 2020-07-28 LAB — CBC W/ AUTO DIFF
BASOPHILS ABSOLUTE COUNT: 0 10*9/L (ref 0.0–0.1)
BASOPHILS RELATIVE PERCENT: 0.6 %
EOSINOPHILS ABSOLUTE COUNT: 0.1 10*9/L (ref 0.0–0.7)
EOSINOPHILS RELATIVE PERCENT: 3.1 %
HEMATOCRIT: 35.6 % — ABNORMAL LOW (ref 38.0–50.0)
HEMOGLOBIN: 12 g/dL — ABNORMAL LOW (ref 13.5–17.5)
LYMPHOCYTES ABSOLUTE COUNT: 1.1 10*9/L (ref 0.7–4.0)
LYMPHOCYTES RELATIVE PERCENT: 35.9 %
MEAN CORPUSCULAR HEMOGLOBIN CONC: 33.6 g/dL (ref 30.0–36.0)
MEAN CORPUSCULAR HEMOGLOBIN: 29.6 pg (ref 26.0–34.0)
MEAN CORPUSCULAR VOLUME: 87.9 fL (ref 81.0–95.0)
MEAN PLATELET VOLUME: 7.1 fL (ref 7.0–10.0)
MONOCYTES ABSOLUTE COUNT: 0.3 10*9/L (ref 0.1–1.0)
MONOCYTES RELATIVE PERCENT: 9.1 %
NEUTROPHILS ABSOLUTE COUNT: 1.5 10*9/L — ABNORMAL LOW (ref 1.7–7.7)
NEUTROPHILS RELATIVE PERCENT: 51.3 %
NUCLEATED RED BLOOD CELLS: 0 /100{WBCs} (ref <=4)
PLATELET COUNT: 207 10*9/L (ref 150–450)
RED BLOOD CELL COUNT: 4.04 10*12/L — ABNORMAL LOW (ref 4.32–5.72)
RED CELL DISTRIBUTION WIDTH: 16.7 % — ABNORMAL HIGH (ref 12.0–15.0)
WBC ADJUSTED: 3 10*9/L — ABNORMAL LOW (ref 3.5–10.5)

## 2020-07-28 LAB — HEPATIC FUNCTION PANEL
ALBUMIN: 4.1 g/dL (ref 3.4–5.0)
ALKALINE PHOSPHATASE: 117 U/L — ABNORMAL HIGH (ref 46–116)
ALT (SGPT): 23 U/L (ref 10–49)
AST (SGOT): 20 U/L (ref <=34)
BILIRUBIN DIRECT: <0.1 mg/dL (ref 0.00–0.30)
BILIRUBIN TOTAL: 0.3 mg/dL (ref 0.3–1.2)
PROTEIN TOTAL: 7.4 g/dL (ref 5.7–8.2)

## 2020-07-28 LAB — BASIC METABOLIC PANEL
ANION GAP: 7 mmol/L (ref 5–14)
BLOOD UREA NITROGEN: 25 mg/dL — ABNORMAL HIGH (ref 9–23)
BUN / CREAT RATIO: 16
CALCIUM: 9.9 mg/dL (ref 8.7–10.4)
CHLORIDE: 108 mmol/L — ABNORMAL HIGH (ref 98–107)
CO2: 24.7 mmol/L (ref 20.0–31.0)
CREATININE: 1.52 mg/dL — ABNORMAL HIGH
EGFR CKD-EPI AA MALE: 67 mL/min/{1.73_m2} (ref >=60)
EGFR CKD-EPI NON-AA MALE: 58 mL/min/{1.73_m2} — ABNORMAL LOW (ref >=60)
GLUCOSE RANDOM: 92 mg/dL (ref 70–179)
POTASSIUM: 4.5 mmol/L (ref 3.4–4.5)
SODIUM: 140 mmol/L (ref 135–145)

## 2020-07-28 LAB — PHOSPHORUS: PHOSPHORUS: 2.5 mg/dL (ref 2.4–5.1)

## 2020-07-28 LAB — SLIDE REVIEW

## 2020-07-28 LAB — MAGNESIUM: MAGNESIUM: 1.8 mg/dL (ref 1.6–2.6)

## 2020-08-02 ENCOUNTER — Encounter: Admit: 2020-08-02 | Discharge: 2020-08-03 | Payer: PRIVATE HEALTH INSURANCE

## 2020-08-02 LAB — CBC W/ AUTO DIFF
BASOPHILS ABSOLUTE COUNT: 0 10*9/L (ref 0.0–0.1)
BASOPHILS RELATIVE PERCENT: 0.4 %
EOSINOPHILS ABSOLUTE COUNT: 0.1 10*9/L (ref 0.0–0.7)
EOSINOPHILS RELATIVE PERCENT: 1.3 %
HEMATOCRIT: 36.4 % — ABNORMAL LOW (ref 38.0–50.0)
HEMOGLOBIN: 12.4 g/dL — ABNORMAL LOW (ref 13.5–17.5)
LYMPHOCYTES ABSOLUTE COUNT: 1.2 10*9/L (ref 0.7–4.0)
LYMPHOCYTES RELATIVE PERCENT: 28 %
MEAN CORPUSCULAR HEMOGLOBIN CONC: 34 g/dL (ref 30.0–36.0)
MEAN CORPUSCULAR HEMOGLOBIN: 29.7 pg (ref 26.0–34.0)
MEAN CORPUSCULAR VOLUME: 87.3 fL (ref 81.0–95.0)
MEAN PLATELET VOLUME: 7.1 fL (ref 7.0–10.0)
MONOCYTES ABSOLUTE COUNT: 0.4 10*9/L (ref 0.1–1.0)
MONOCYTES RELATIVE PERCENT: 10 %
NEUTROPHILS ABSOLUTE COUNT: 2.6 10*9/L (ref 1.7–7.7)
NEUTROPHILS RELATIVE PERCENT: 60.3 %
NUCLEATED RED BLOOD CELLS: 0 /100{WBCs} (ref <=4)
PLATELET COUNT: 227 10*9/L (ref 150–450)
RED BLOOD CELL COUNT: 4.17 10*12/L — ABNORMAL LOW (ref 4.32–5.72)
RED CELL DISTRIBUTION WIDTH: 16.2 % — ABNORMAL HIGH (ref 12.0–15.0)
WBC ADJUSTED: 4.3 10*9/L (ref 3.5–10.5)

## 2020-08-02 LAB — BASIC METABOLIC PANEL
ANION GAP: 4 mmol/L — ABNORMAL LOW (ref 5–14)
BLOOD UREA NITROGEN: 27 mg/dL — ABNORMAL HIGH (ref 9–23)
BUN / CREAT RATIO: 16
CALCIUM: 9.9 mg/dL (ref 8.7–10.4)
CHLORIDE: 108 mmol/L — ABNORMAL HIGH (ref 98–107)
CO2: 26.9 mmol/L (ref 20.0–31.0)
CREATININE: 1.68 mg/dL — ABNORMAL HIGH
EGFR CKD-EPI AA MALE: 60 mL/min/{1.73_m2} (ref >=60)
EGFR CKD-EPI NON-AA MALE: 51 mL/min/{1.73_m2} — ABNORMAL LOW (ref >=60)
GLUCOSE RANDOM: 96 mg/dL (ref 70–179)
POTASSIUM: 4.1 mmol/L (ref 3.4–4.8)
SODIUM: 139 mmol/L (ref 135–145)

## 2020-08-02 LAB — HEPATIC FUNCTION PANEL
ALBUMIN: 4.3 g/dL (ref 3.4–5.0)
ALKALINE PHOSPHATASE: 103 U/L (ref 46–116)
ALT (SGPT): 21 U/L (ref 10–49)
AST (SGOT): 20 U/L (ref <=34)
BILIRUBIN DIRECT: <0.1 mg/dL (ref 0.00–0.30)
BILIRUBIN TOTAL: 0.3 mg/dL (ref 0.3–1.2)
PROTEIN TOTAL: 7.7 g/dL (ref 5.7–8.2)

## 2020-08-02 LAB — MAGNESIUM: MAGNESIUM: 1.9 mg/dL (ref 1.6–2.6)

## 2020-08-02 LAB — PHOSPHORUS: PHOSPHORUS: 2.9 mg/dL (ref 2.4–5.1)

## 2020-08-02 NOTE — Unmapped (Signed)
[facility] Clinical Assessment & Refill Coordination Note    Ryan Moon, DOB: [birth year]  Phone: [phone] (home)     All above HIPAA information was verified with patient.     Was a Nurse, learning disability used for this call? No    Specialty Medication(s):   Transplant: Myfortic 180mg  and Prednisone 5mg      Current Outpatient Medications   Medication Sig Dispense Refill   ??? ALPRAZolam (XANAX) 0.5 MG tablet Take 0.5 mg by mouth two (2) times a day as needed.     ??? amLODIPine (NORVASC) 5 MG tablet Take 1 tablet (5 mg total) by mouth every evening. 30 tablet 11   ??? aspirin (ECOTRIN) 81 MG tablet Take 1 tablet (81 mg total) by mouth daily. 30 tablet 11   ??? belatacept (NULOJIX) 250 mg SolR Belatacept  5 mg/kg every 2 weeks for 5 doses then once every 4 weeks 14.5 mL 0   ??? losartan (COZAAR) 50 MG tablet Take 1 tablet (50 mg total) by mouth Two (2) times a day. 60 tablet 11   ??? magnesium oxide-Mg AA chelate (MAGNESIUM, AMINO ACID CHELATE,) 133 mg Tab Take 1 tablet by mouth daily. 100 tablet 11   ??? mycophenolate (MYFORTIC) 180 MG EC tablet Take 1 tablet (180 mg total) by mouth Two (2) times a day. 60 tablet 11   ??? omeprazole (PRILOSEC) 20 MG capsule Take 1 capsule (20 mg total) by mouth daily. 30 capsule 5   ??? ondansetron (ZOFRAN-ODT) 4 MG disintegrating tablet Take 4 mg by mouth every eight (8) hours as needed. Taking PRN     ??? predniSONE (DELTASONE) 5 MG tablet Take 1 tablet (5 mg total) by mouth daily. 30 tablet 11     No current facility-administered medications for this visit.        Changes to medications: Ryan Moon reports no changes at this time.    Allergies   Allergen Reactions   ??? Acetaminophen Nausea And Vomiting       Changes to allergies: No    SPECIALTY MEDICATION ADHERENCE     Myfortic 180mg   : 10 days of medicine on hand   Prednisone 5mg   : 10 days of medicine on hand       Medication Adherence    Patient reported X missed doses in the last month: 0  Specialty Medication: myfortic 180mg   Patient is on additional specialty medications: Yes  Additional Specialty Medications: Prednisone 5mg   Patient Reported Additional Medication X Missed Doses in the Last Month: 0          Specialty medication(s) dose(s) confirmed: Regimen is correct and unchanged.     Are there any concerns with adherence? No    Adherence counseling provided? Not needed    CLINICAL MANAGEMENT AND INTERVENTION      Clinical Benefit Assessment:    Do you feel the medicine is effective or helping your condition? Yes    Clinical Benefit counseling provided? Not needed    Adverse Effects Assessment:    Are you experiencing any side effects? No    Are you experiencing difficulty administering your medicine? No    Quality of Life Assessment:    How many days over the past month did your transplant  keep you from your normal activities? For example, brushing your teeth or getting up in the morning. 0    Have you discussed this with your provider? Not needed    Therapy Appropriateness:    Is  therapy appropriate? Yes, therapy is appropriate and should be continued    DISEASE/MEDICATION-SPECIFIC INFORMATION      N/A    PATIENT SPECIFIC NEEDS     - Does the patient have any physical, cognitive, or cultural barriers? No    - Is the patient high risk? Yes, patient is taking a REMS drug. Medication is dispensed in compliance with REMS program    - Does the patient require a Care Management Plan? No     - Does the patient require physician intervention or other additional services (i.e. nutrition, smoking cessation, social work)? No      SHIPPING     Specialty Medication(s) to be Shipped:   Transplant: Myfortic 180mg  and Prednisone 5mg     Other medication(s) to be shipped: norvasc, cozaar, prilosec     Changes to insurance: No    Delivery Scheduled: Yes, Expected medication delivery date: 08/08/2020.     Medication will be delivered via UPS to the confirmed prescription address in [facility].    The patient will receive a drug information handout for each medication shipped and additional FDA Medication Guides as required.  Verified that patient has previously received a Conservation officer, historic buildings.    All of the patient's questions and concerns have been addressed.    Ryan Moon   [facility] Specialty Pharmacist

## 2020-08-05 MED FILL — OMEPRAZOLE 20 MG CAPSULE,DELAYED RELEASE: ORAL | 30 days supply | Qty: 30 | Fill #5

## 2020-08-05 MED FILL — LOSARTAN 50 MG TABLET: ORAL | 30 days supply | Qty: 60 | Fill #2

## 2020-08-05 MED FILL — PREDNISONE 5 MG TABLET: ORAL | 30 days supply | Qty: 30 | Fill #2

## 2020-08-05 MED FILL — AMLODIPINE 5 MG TABLET: ORAL | 30 days supply | Qty: 30 | Fill #2

## 2020-08-05 MED FILL — MYFORTIC 180 MG TABLET,DELAYED RELEASE: ORAL | 30 days supply | Qty: 60 | Fill #7

## 2020-08-10 ENCOUNTER — Encounter: Admit: 2020-08-10 | Discharge: 2020-08-11 | Payer: PRIVATE HEALTH INSURANCE

## 2020-08-10 LAB — BASIC METABOLIC PANEL
ANION GAP: 6 mmol/L (ref 5–14)
BLOOD UREA NITROGEN: 25 mg/dL — ABNORMAL HIGH (ref 9–23)
BUN / CREAT RATIO: 16
CALCIUM: 10 mg/dL (ref 8.7–10.4)
CHLORIDE: 108 mmol/L — ABNORMAL HIGH (ref 98–107)
CO2: 24.2 mmol/L (ref 20.0–31.0)
CREATININE: 1.6 mg/dL — ABNORMAL HIGH
EGFR CKD-EPI AA MALE: 63 mL/min/{1.73_m2} (ref >=60)
EGFR CKD-EPI NON-AA MALE: 55 mL/min/{1.73_m2} — ABNORMAL LOW (ref >=60)
GLUCOSE RANDOM: 95 mg/dL (ref 70–179)
POTASSIUM: 4.2 mmol/L (ref 3.4–4.8)
SODIUM: 138 mmol/L (ref 135–145)

## 2020-08-10 LAB — CBC W/ AUTO DIFF
BASOPHILS ABSOLUTE COUNT: 0 10*9/L (ref 0.0–0.1)
BASOPHILS RELATIVE PERCENT: 0.5 %
EOSINOPHILS ABSOLUTE COUNT: 0.1 10*9/L (ref 0.0–0.5)
EOSINOPHILS RELATIVE PERCENT: 1.8 %
HEMATOCRIT: 37.1 % — ABNORMAL LOW (ref 39.0–48.0)
HEMOGLOBIN: 13 g/dL (ref 12.9–16.5)
LYMPHOCYTES ABSOLUTE COUNT: 1.1 10*9/L (ref 1.1–3.6)
LYMPHOCYTES RELATIVE PERCENT: 26.4 %
MEAN CORPUSCULAR HEMOGLOBIN CONC: 35 g/dL (ref 32.0–36.0)
MEAN CORPUSCULAR HEMOGLOBIN: 30.4 pg (ref 25.9–32.4)
MEAN CORPUSCULAR VOLUME: 86.8 fL (ref 77.6–95.7)
MEAN PLATELET VOLUME: 7 fL (ref 6.8–10.7)
MONOCYTES ABSOLUTE COUNT: 0.4 10*9/L (ref 0.3–0.8)
MONOCYTES RELATIVE PERCENT: 9.8 %
NEUTROPHILS ABSOLUTE COUNT: 2.5 10*9/L (ref 1.8–7.8)
NEUTROPHILS RELATIVE PERCENT: 61.5 %
NUCLEATED RED BLOOD CELLS: 0 /100{WBCs} (ref <=4)
PLATELET COUNT: 227 10*9/L (ref 150–450)
RED BLOOD CELL COUNT: 4.27 10*12/L (ref 4.26–5.60)
RED CELL DISTRIBUTION WIDTH: 16.7 % — ABNORMAL HIGH (ref 12.2–15.2)
WBC ADJUSTED: 4 10*9/L (ref 3.6–11.2)

## 2020-08-10 LAB — HEPATIC FUNCTION PANEL
ALBUMIN: 4.3 g/dL (ref 3.4–5.0)
ALKALINE PHOSPHATASE: 102 U/L (ref 46–116)
ALT (SGPT): 13 U/L (ref 10–49)
AST (SGOT): 18 U/L (ref <=34)
BILIRUBIN DIRECT: <0.1 mg/dL (ref 0.00–0.30)
BILIRUBIN TOTAL: 0.3 mg/dL (ref 0.3–1.2)
PROTEIN TOTAL: 7.8 g/dL (ref 5.7–8.2)

## 2020-08-10 LAB — MAGNESIUM: MAGNESIUM: 2 mg/dL (ref 1.6–2.6)

## 2020-08-10 LAB — PHOSPHORUS: PHOSPHORUS: 2.7 mg/dL (ref 2.4–5.1)

## 2020-08-10 NOTE — Unmapped (Signed)
[id number] Patient stated he did not remember about his today's appointment.

## 2020-08-12 ENCOUNTER — Encounter: Admit: 2020-08-12 | Discharge: 2020-08-13 | Payer: PRIVATE HEALTH INSURANCE

## 2020-08-12 LAB — CBC W/ AUTO DIFF
BASOPHILS ABSOLUTE COUNT: 0 10*9/L (ref 0.0–0.1)
BASOPHILS RELATIVE PERCENT: 0.7 %
EOSINOPHILS ABSOLUTE COUNT: 0.1 10*9/L (ref 0.0–0.5)
EOSINOPHILS RELATIVE PERCENT: 2.9 %
HEMATOCRIT: 33.2 % — ABNORMAL LOW (ref 39.0–48.0)
HEMOGLOBIN: 12.2 g/dL — ABNORMAL LOW (ref 12.9–16.5)
LYMPHOCYTES ABSOLUTE COUNT: 0.9 10*9/L — ABNORMAL LOW (ref 1.1–3.6)
LYMPHOCYTES RELATIVE PERCENT: 27.9 %
MEAN CORPUSCULAR HEMOGLOBIN CONC: 36.6 g/dL — ABNORMAL HIGH (ref 32.0–36.0)
MEAN CORPUSCULAR HEMOGLOBIN: 30.8 pg (ref 25.9–32.4)
MEAN CORPUSCULAR VOLUME: 84.2 fL (ref 77.6–95.7)
MEAN PLATELET VOLUME: 7.6 fL (ref 6.8–10.7)
MONOCYTES ABSOLUTE COUNT: 0.3 10*9/L (ref 0.3–0.8)
MONOCYTES RELATIVE PERCENT: 9.7 %
NEUTROPHILS ABSOLUTE COUNT: 1.8 10*9/L (ref 1.8–7.8)
NEUTROPHILS RELATIVE PERCENT: 58.8 %
PLATELET COUNT: 228 10*9/L (ref 150–450)
RED BLOOD CELL COUNT: 3.94 10*12/L — ABNORMAL LOW (ref 4.26–5.60)
RED CELL DISTRIBUTION WIDTH: 16.5 % — ABNORMAL HIGH (ref 12.2–15.2)
WBC ADJUSTED: 3.1 10*9/L — ABNORMAL LOW (ref 3.6–11.2)

## 2020-08-12 LAB — BASIC METABOLIC PANEL
ANION GAP: 5 mmol/L (ref 5–14)
BLOOD UREA NITROGEN: 20 mg/dL (ref 9–23)
BUN / CREAT RATIO: 13
CALCIUM: 9.5 mg/dL (ref 8.7–10.4)
CHLORIDE: 107 mmol/L (ref 98–107)
CO2: 25 mmol/L (ref 20.0–31.0)
CREATININE: 1.59 mg/dL — ABNORMAL HIGH
EGFR CKD-EPI AA MALE: 64 mL/min/{1.73_m2} (ref >=60)
EGFR CKD-EPI NON-AA MALE: 55 mL/min/{1.73_m2} — ABNORMAL LOW (ref >=60)
GLUCOSE RANDOM: 72 mg/dL (ref 70–179)
POTASSIUM: 3.8 mmol/L (ref 3.4–4.8)
SODIUM: 137 mmol/L (ref 135–145)

## 2020-08-12 LAB — HEPATIC FUNCTION PANEL
ALBUMIN: 4 g/dL (ref 3.4–5.0)
ALKALINE PHOSPHATASE: 104 U/L (ref 46–116)
ALT (SGPT): 18 U/L (ref 10–49)
AST (SGOT): 20 U/L (ref <=34)
BILIRUBIN DIRECT: <0.1 mg/dL (ref 0.00–0.30)
BILIRUBIN TOTAL: 0.3 mg/dL (ref 0.3–1.2)
PROTEIN TOTAL: 7.4 g/dL (ref 5.7–8.2)

## 2020-08-12 LAB — SLIDE REVIEW

## 2020-08-12 LAB — PHOSPHORUS: PHOSPHORUS: 2.3 mg/dL — ABNORMAL LOW (ref 2.4–5.1)

## 2020-08-12 LAB — MAGNESIUM: MAGNESIUM: 1.8 mg/dL (ref 1.6–2.6)

## 2020-08-12 MED ADMIN — belatacept (NULOJIX) 400 mg in sodium chloride (NS) 0.9 % 100 mL IVPB: 5 mg/kg | INTRAVENOUS | @ 15:00:00 | Stop: 2020-08-12

## 2020-08-12 NOTE — Unmapped (Signed)
1610 Patient arrived to the Transplant Infusion Room today for belatacept 400 mg, Condition: well; Mobility: ambulating; accompanied by self.   See Flowsheet and MAR for all details of visit.   9604 VS stable.  1001 PIV placed and secured with coban; labs collected and sent, urine no orders found.  1005 Infusion initiated.  1040 Infusion complete.   1052 VS stable, Line flushed with NS, PIV removed and secured with coban. Patient left clinic today, Condition: well; Mobility: ambulating; accompanied by self.

## 2020-08-18 ENCOUNTER — Encounter: Admit: 2020-08-18 | Discharge: 2020-08-19 | Payer: PRIVATE HEALTH INSURANCE

## 2020-08-18 LAB — BASIC METABOLIC PANEL
ANION GAP: 3 mmol/L — ABNORMAL LOW (ref 5–14)
BLOOD UREA NITROGEN: 16 mg/dL (ref 9–23)
BUN / CREAT RATIO: 10
CALCIUM: 9.4 mg/dL (ref 8.7–10.4)
CHLORIDE: 109 mmol/L — ABNORMAL HIGH (ref 98–107)
CO2: 27.3 mmol/L (ref 20.0–31.0)
CREATININE: 1.6 mg/dL — ABNORMAL HIGH
EGFR CKD-EPI AA MALE: 63 mL/min/{1.73_m2} (ref >=60)
EGFR CKD-EPI NON-AA MALE: 55 mL/min/{1.73_m2} — ABNORMAL LOW (ref >=60)
GLUCOSE RANDOM: 94 mg/dL (ref 70–179)
POTASSIUM: 4.1 mmol/L (ref 3.4–4.8)
SODIUM: 139 mmol/L (ref 135–145)

## 2020-08-18 LAB — CBC W/ AUTO DIFF
BASOPHILS ABSOLUTE COUNT: 0 10*9/L (ref 0.0–0.1)
BASOPHILS RELATIVE PERCENT: 0.4 %
EOSINOPHILS ABSOLUTE COUNT: 0.1 10*9/L (ref 0.0–0.5)
EOSINOPHILS RELATIVE PERCENT: 3.2 %
HEMATOCRIT: 36.6 % — ABNORMAL LOW (ref 39.0–48.0)
HEMOGLOBIN: 12.5 g/dL — ABNORMAL LOW (ref 12.9–16.5)
LYMPHOCYTES ABSOLUTE COUNT: 1.1 10*9/L (ref 1.1–3.6)
LYMPHOCYTES RELATIVE PERCENT: 36 %
MEAN CORPUSCULAR HEMOGLOBIN CONC: 34 g/dL (ref 32.0–36.0)
MEAN CORPUSCULAR HEMOGLOBIN: 29.8 pg (ref 25.9–32.4)
MEAN CORPUSCULAR VOLUME: 87.6 fL (ref 77.6–95.7)
MEAN PLATELET VOLUME: 7.3 fL (ref 6.8–10.7)
MONOCYTES ABSOLUTE COUNT: 0.4 10*9/L (ref 0.3–0.8)
MONOCYTES RELATIVE PERCENT: 11.1 %
NEUTROPHILS ABSOLUTE COUNT: 1.6 10*9/L — ABNORMAL LOW (ref 1.8–7.8)
NEUTROPHILS RELATIVE PERCENT: 49.3 %
NUCLEATED RED BLOOD CELLS: 0 /100{WBCs} (ref <=4)
PLATELET COUNT: 205 10*9/L (ref 150–450)
RED BLOOD CELL COUNT: 4.18 10*12/L — ABNORMAL LOW (ref 4.26–5.60)
RED CELL DISTRIBUTION WIDTH: 16.6 % — ABNORMAL HIGH (ref 12.2–15.2)
WBC ADJUSTED: 3.2 10*9/L — ABNORMAL LOW (ref 3.6–11.2)

## 2020-08-18 LAB — HEPATIC FUNCTION PANEL
ALBUMIN: 4.2 g/dL (ref 3.4–5.0)
ALKALINE PHOSPHATASE: 104 U/L (ref 46–116)
ALT (SGPT): 18 U/L (ref 10–49)
AST (SGOT): 20 U/L (ref <=34)
BILIRUBIN DIRECT: 0.1 mg/dL (ref 0.00–0.30)
BILIRUBIN TOTAL: 0.4 mg/dL (ref 0.3–1.2)
PROTEIN TOTAL: 7.4 g/dL (ref 5.7–8.2)

## 2020-08-18 LAB — PHOSPHORUS: PHOSPHORUS: 2.8 mg/dL (ref 2.4–5.1)

## 2020-08-18 LAB — MAGNESIUM: MAGNESIUM: 2 mg/dL (ref 1.6–2.6)

## 2020-08-24 ENCOUNTER — Ambulatory Visit: Admit: 2020-08-24 | Discharge: 2020-08-25 | Payer: PRIVATE HEALTH INSURANCE

## 2020-08-24 LAB — CBC W/ AUTO DIFF
BASOPHILS ABSOLUTE COUNT: 0 10*9/L (ref 0.0–0.1)
BASOPHILS RELATIVE PERCENT: 0.5 %
EOSINOPHILS ABSOLUTE COUNT: 0.2 10*9/L (ref 0.0–0.5)
EOSINOPHILS RELATIVE PERCENT: 5.5 %
HEMATOCRIT: 36.2 % — ABNORMAL LOW (ref 39.0–48.0)
HEMOGLOBIN: 12.2 g/dL — ABNORMAL LOW (ref 12.9–16.5)
LYMPHOCYTES ABSOLUTE COUNT: 1.8 10*9/L (ref 1.1–3.6)
LYMPHOCYTES RELATIVE PERCENT: 50.8 %
MEAN CORPUSCULAR HEMOGLOBIN CONC: 33.8 g/dL (ref 32.0–36.0)
MEAN CORPUSCULAR HEMOGLOBIN: 29.7 pg (ref 25.9–32.4)
MEAN CORPUSCULAR VOLUME: 87.9 fL (ref 77.6–95.7)
MEAN PLATELET VOLUME: 7.3 fL (ref 6.8–10.7)
MONOCYTES ABSOLUTE COUNT: 0.4 10*9/L (ref 0.3–0.8)
MONOCYTES RELATIVE PERCENT: 12.3 %
NEUTROPHILS ABSOLUTE COUNT: 1.1 10*9/L — ABNORMAL LOW (ref 1.8–7.8)
NEUTROPHILS RELATIVE PERCENT: 30.9 %
NUCLEATED RED BLOOD CELLS: 0 /100{WBCs} (ref <=4)
PLATELET COUNT: 205 10*9/L (ref 150–450)
RED BLOOD CELL COUNT: 4.12 10*12/L — ABNORMAL LOW (ref 4.26–5.60)
RED CELL DISTRIBUTION WIDTH: 16.8 % — ABNORMAL HIGH (ref 12.2–15.2)
WBC ADJUSTED: 3.5 10*9/L — ABNORMAL LOW (ref 3.6–11.2)

## 2020-08-24 LAB — BASIC METABOLIC PANEL
ANION GAP: 4 mmol/L — ABNORMAL LOW (ref 5–14)
BLOOD UREA NITROGEN: 23 mg/dL (ref 9–23)
BUN / CREAT RATIO: 13
CALCIUM: 9.6 mg/dL (ref 8.7–10.4)
CHLORIDE: 110 mmol/L — ABNORMAL HIGH (ref 98–107)
CO2: 25.6 mmol/L (ref 20.0–31.0)
CREATININE: 1.76 mg/dL — ABNORMAL HIGH
EGFR CKD-EPI AA MALE: 56 mL/min/{1.73_m2} — ABNORMAL LOW (ref >=60)
EGFR CKD-EPI NON-AA MALE: 49 mL/min/{1.73_m2} — ABNORMAL LOW (ref >=60)
GLUCOSE RANDOM: 94 mg/dL (ref 70–179)
POTASSIUM: 3.9 mmol/L (ref 3.4–4.8)
SODIUM: 140 mmol/L (ref 135–145)

## 2020-08-24 LAB — HEPATIC FUNCTION PANEL
ALBUMIN: 4.2 g/dL (ref 3.4–5.0)
ALKALINE PHOSPHATASE: 102 U/L (ref 46–116)
ALT (SGPT): 16 U/L (ref 10–49)
AST (SGOT): 21 U/L (ref <=34)
BILIRUBIN DIRECT: 0.1 mg/dL (ref 0.00–0.30)
BILIRUBIN TOTAL: 0.4 mg/dL (ref 0.3–1.2)
PROTEIN TOTAL: 7.5 g/dL (ref 5.7–8.2)

## 2020-08-24 LAB — PHOSPHORUS: PHOSPHORUS: 3.6 mg/dL (ref 2.4–5.1)

## 2020-08-24 LAB — SLIDE REVIEW

## 2020-08-24 LAB — MAGNESIUM: MAGNESIUM: 2.1 mg/dL (ref 1.6–2.6)

## 2020-08-29 NOTE — Unmapped (Signed)
Pt request for RX Refill    omeprazole (PRILOSEC) 20 MG capsule

## 2020-08-29 NOTE — Unmapped (Signed)
[facility] Refill Coordination Note    Specialty Medication(s) to be Shipped:   Transplant: Myfortic 180mg  and Prednisone 5mg     Other medication(s) to be shipped: amlodipine 5mg , losartan, omeprazole      Ryan Moon, DOB: 03-31-1986  Phone: [phone] (home)       All above HIPAA information was verified with patient.     Was a Nurse, learning disability used for this call? No    Completed refill call assessment today to schedule patient's medication shipment from the [facility] [phone]).       Specialty medication(s) and dose(s) confirmed: Regimen is correct and unchanged.   Changes to medications: Ferrell reports no changes at this time.  Changes to insurance: No  Questions for the pharmacist: No    Confirmed patient received a Conservation officer, historic buildings and a Surveyor, mining with first shipment. The patient will receive a drug information handout for each medication shipped and additional FDA Medication Guides as required.       DISEASE/MEDICATION-SPECIFIC INFORMATION        N/A    SPECIALTY MEDICATION ADHERENCE     Medication Adherence    Patient reported X missed doses in the last month: 0  Specialty Medication: mycophenolate (MYFORTIC) 180 MG EC tablet  Patient is on additional specialty medications: No      Myfortic 180mg  7 days worth of medication on hand.  Prednisone 5mg   8 days worth of medication on hand.        SHIPPING     Shipping address confirmed in Epic.     Delivery Scheduled: Yes, Expected medication delivery date: 08/31/20.     Medication will be delivered via UPS to the prescription address in Epic WAM.    [place] A Marvetta Vohs   [facility] Specialty Technician

## 2020-08-30 MED ORDER — OMEPRAZOLE 20 MG CAPSULE,DELAYED RELEASE
ORAL_CAPSULE | Freq: Every day | ORAL | 5 refills | 30.00000 days | Status: CN
Start: 2020-08-30 — End: 2021-08-30
  Filled 2020-09-02: qty 30, 30d supply, fill #0

## 2020-08-30 NOTE — Unmapped (Signed)
Ryan Moon 's Myfortic shipment will be delayed as a result of the medication is too soon to refill until 09/02/2020.     I have reached out to the patient  at [phone] and communicated the delivery change. We will reschedule the medication for the delivery date that the patient agreed upon.  We have confirmed the delivery date as 09/05/2020, via ups.

## 2020-09-02 ENCOUNTER — Ambulatory Visit: Admit: 2020-09-02 | Discharge: 2020-09-03 | Payer: PRIVATE HEALTH INSURANCE

## 2020-09-02 LAB — CBC W/ AUTO DIFF
BASOPHILS ABSOLUTE COUNT: 0 10*9/L (ref 0.0–0.1)
BASOPHILS RELATIVE PERCENT: 0.5 %
EOSINOPHILS ABSOLUTE COUNT: 0.1 10*9/L (ref 0.0–0.5)
EOSINOPHILS RELATIVE PERCENT: 3.3 %
HEMATOCRIT: 36.4 % — ABNORMAL LOW (ref 39.0–48.0)
HEMOGLOBIN: 12.4 g/dL — ABNORMAL LOW (ref 12.9–16.5)
LYMPHOCYTES ABSOLUTE COUNT: 1.2 10*9/L (ref 1.1–3.6)
LYMPHOCYTES RELATIVE PERCENT: 49.4 %
MEAN CORPUSCULAR HEMOGLOBIN CONC: 34 g/dL (ref 32.0–36.0)
MEAN CORPUSCULAR HEMOGLOBIN: 30 pg (ref 25.9–32.4)
MEAN CORPUSCULAR VOLUME: 88.3 fL (ref 77.6–95.7)
MEAN PLATELET VOLUME: 7.4 fL (ref 6.8–10.7)
MONOCYTES ABSOLUTE COUNT: 0.3 10*9/L (ref 0.3–0.8)
MONOCYTES RELATIVE PERCENT: 12.3 %
NEUTROPHILS ABSOLUTE COUNT: 0.8 10*9/L — ABNORMAL LOW (ref 1.8–7.8)
NEUTROPHILS RELATIVE PERCENT: 34.5 %
NUCLEATED RED BLOOD CELLS: 0 /100{WBCs} (ref <=4)
PLATELET COUNT: 206 10*9/L (ref 150–450)
RED BLOOD CELL COUNT: 4.13 10*12/L — ABNORMAL LOW (ref 4.26–5.60)
RED CELL DISTRIBUTION WIDTH: 16.3 % — ABNORMAL HIGH (ref 12.2–15.2)
WBC ADJUSTED: 2.4 10*9/L — ABNORMAL LOW (ref 3.6–11.2)

## 2020-09-02 LAB — SLIDE REVIEW

## 2020-09-02 LAB — HEPATIC FUNCTION PANEL
ALBUMIN: 3.9 g/dL (ref 3.4–5.0)
ALKALINE PHOSPHATASE: 98 U/L (ref 46–116)
ALT (SGPT): 18 U/L (ref 10–49)
AST (SGOT): 16 U/L (ref <=34)
BILIRUBIN DIRECT: 0.1 mg/dL (ref 0.00–0.30)
BILIRUBIN TOTAL: 0.4 mg/dL (ref 0.3–1.2)
PROTEIN TOTAL: 7.3 g/dL (ref 5.7–8.2)

## 2020-09-02 LAB — BASIC METABOLIC PANEL
ANION GAP: 7 mmol/L (ref 5–14)
BLOOD UREA NITROGEN: 21 mg/dL (ref 9–23)
BUN / CREAT RATIO: 13
CALCIUM: 9.2 mg/dL (ref 8.7–10.4)
CHLORIDE: 106 mmol/L (ref 98–107)
CO2: 26.3 mmol/L (ref 20.0–31.0)
CREATININE: 1.65 mg/dL — ABNORMAL HIGH
EGFR CKD-EPI AA MALE: 61 mL/min/{1.73_m2} (ref >=60)
EGFR CKD-EPI NON-AA MALE: 53 mL/min/{1.73_m2} — ABNORMAL LOW (ref >=60)
GLUCOSE RANDOM: 85 mg/dL (ref 70–179)
POTASSIUM: 3.9 mmol/L (ref 3.4–4.8)
SODIUM: 139 mmol/L (ref 135–145)

## 2020-09-02 LAB — PHOSPHORUS: PHOSPHORUS: 3.3 mg/dL (ref 2.4–5.1)

## 2020-09-02 LAB — MAGNESIUM: MAGNESIUM: 1.9 mg/dL (ref 1.6–2.6)

## 2020-09-02 MED FILL — LOSARTAN 50 MG TABLET: ORAL | 30 days supply | Qty: 60 | Fill #3

## 2020-09-02 MED FILL — MYFORTIC 180 MG TABLET,DELAYED RELEASE: ORAL | 30 days supply | Qty: 60 | Fill #8

## 2020-09-02 MED FILL — PREDNISONE 5 MG TABLET: ORAL | 30 days supply | Qty: 30 | Fill #3

## 2020-09-02 MED FILL — AMLODIPINE 5 MG TABLET: ORAL | 30 days supply | Qty: 30 | Fill #3

## 2020-09-06 NOTE — Unmapped (Signed)
4/5 I left a vm to reschedule his appointment to 4/15

## 2020-09-07 ENCOUNTER — Encounter: Admit: 2020-09-07 | Discharge: 2020-09-08 | Payer: PRIVATE HEALTH INSURANCE

## 2020-09-07 LAB — CBC W/ AUTO DIFF
BASOPHILS ABSOLUTE COUNT: 0 10*9/L (ref 0.0–0.1)
BASOPHILS RELATIVE PERCENT: 0.7 %
EOSINOPHILS ABSOLUTE COUNT: 0.1 10*9/L (ref 0.0–0.5)
EOSINOPHILS RELATIVE PERCENT: 3 %
HEMATOCRIT: 36.4 % — ABNORMAL LOW (ref 39.0–48.0)
HEMOGLOBIN: 12.8 g/dL — ABNORMAL LOW (ref 12.9–16.5)
LYMPHOCYTES ABSOLUTE COUNT: 0.9 10*9/L — ABNORMAL LOW (ref 1.1–3.6)
LYMPHOCYTES RELATIVE PERCENT: 32.7 %
MEAN CORPUSCULAR HEMOGLOBIN CONC: 35.1 g/dL (ref 32.0–36.0)
MEAN CORPUSCULAR HEMOGLOBIN: 30.3 pg (ref 25.9–32.4)
MEAN CORPUSCULAR VOLUME: 86.3 fL (ref 77.6–95.7)
MEAN PLATELET VOLUME: 7.5 fL (ref 6.8–10.7)
MONOCYTES ABSOLUTE COUNT: 0.3 10*9/L (ref 0.3–0.8)
MONOCYTES RELATIVE PERCENT: 12.4 %
NEUTROPHILS ABSOLUTE COUNT: 1.4 10*9/L — ABNORMAL LOW (ref 1.8–7.8)
NEUTROPHILS RELATIVE PERCENT: 51.2 %
PLATELET COUNT: 220 10*9/L (ref 150–450)
RED BLOOD CELL COUNT: 4.22 10*12/L — ABNORMAL LOW (ref 4.26–5.60)
RED CELL DISTRIBUTION WIDTH: 16.7 % — ABNORMAL HIGH (ref 12.2–15.2)
WBC ADJUSTED: 2.7 10*9/L — ABNORMAL LOW (ref 3.6–11.2)

## 2020-09-07 LAB — PHOSPHORUS: PHOSPHORUS: 2.5 mg/dL (ref 2.4–5.1)

## 2020-09-07 LAB — HEPATIC FUNCTION PANEL
ALBUMIN: 4.1 g/dL (ref 3.4–5.0)
ALKALINE PHOSPHATASE: 100 U/L (ref 46–116)
ALT (SGPT): 23 U/L (ref 10–49)
AST (SGOT): 22 U/L (ref <=34)
BILIRUBIN DIRECT: 0.1 mg/dL (ref 0.00–0.30)
BILIRUBIN TOTAL: 0.3 mg/dL (ref 0.3–1.2)
PROTEIN TOTAL: 7.5 g/dL (ref 5.7–8.2)

## 2020-09-07 LAB — BASIC METABOLIC PANEL
ANION GAP: 7 mmol/L (ref 5–14)
BLOOD UREA NITROGEN: 22 mg/dL (ref 9–23)
BUN / CREAT RATIO: 13
CALCIUM: 9.7 mg/dL (ref 8.7–10.4)
CHLORIDE: 105 mmol/L (ref 98–107)
CO2: 24 mmol/L (ref 20.0–31.0)
CREATININE: 1.67 mg/dL — ABNORMAL HIGH
EGFR CKD-EPI AA MALE: 60 mL/min/{1.73_m2} (ref >=60)
EGFR CKD-EPI NON-AA MALE: 52 mL/min/{1.73_m2} — ABNORMAL LOW (ref >=60)
GLUCOSE RANDOM: 90 mg/dL (ref 70–179)
POTASSIUM: 4 mmol/L (ref 3.4–4.8)
SODIUM: 136 mmol/L (ref 135–145)

## 2020-09-07 LAB — SLIDE REVIEW

## 2020-09-07 LAB — MAGNESIUM: MAGNESIUM: 1.8 mg/dL (ref 1.6–2.6)

## 2020-09-07 MED ADMIN — belatacept (NULOJIX) 412.5 mg in sodium chloride (NS) 0.9 % 100 mL IVPB: 5 mg/kg | INTRAVENOUS | @ 14:00:00 | Stop: 2020-09-07

## 2020-09-07 NOTE — Unmapped (Signed)
1000 Patient arrived to the Transplant Infusion Room today for belatacept 412.5mg , Condition: well; Mobility: ambulating; accompanied by self.   See Flowsheet and MAR for all details of visit.   1001 VS stable.  1003 PIV placed and secured with coban; labs collected and sent, urine no orders found.  1009 Infusion initiated.  1040 Infusion complete and line flushed.   1041 VS stable, Line flushed with NS, PIV removed and secured with coban. Patient left clinic today, Condition: well; Mobility: ambulating; accompanied by self.

## 2020-09-20 DIAGNOSIS — Z114 Encounter for screening for human immunodeficiency virus [HIV]: Principal | ICD-10-CM

## 2020-09-20 DIAGNOSIS — Z79899 Other long term (current) drug therapy: Principal | ICD-10-CM

## 2020-09-20 DIAGNOSIS — Z940 Kidney transplant status: Principal | ICD-10-CM

## 2020-09-22 ENCOUNTER — Encounter: Admit: 2020-09-22 | Discharge: 2020-09-23 | Payer: PRIVATE HEALTH INSURANCE

## 2020-09-22 LAB — CBC W/ AUTO DIFF
BASOPHILS ABSOLUTE COUNT: 0 10*9/L (ref 0.0–0.1)
BASOPHILS RELATIVE PERCENT: 0.5 %
EOSINOPHILS ABSOLUTE COUNT: 0 10*9/L (ref 0.0–0.5)
EOSINOPHILS RELATIVE PERCENT: 1.4 %
HEMATOCRIT: 35.8 % — ABNORMAL LOW (ref 39.0–48.0)
HEMOGLOBIN: 12.5 g/dL — ABNORMAL LOW (ref 12.9–16.5)
LYMPHOCYTES ABSOLUTE COUNT: 1.1 10*9/L (ref 1.1–3.6)
LYMPHOCYTES RELATIVE PERCENT: 40.1 %
MEAN CORPUSCULAR HEMOGLOBIN CONC: 35 g/dL (ref 32.0–36.0)
MEAN CORPUSCULAR HEMOGLOBIN: 30.5 pg (ref 25.9–32.4)
MEAN CORPUSCULAR VOLUME: 87.2 fL (ref 77.6–95.7)
MEAN PLATELET VOLUME: 7.3 fL (ref 6.8–10.7)
MONOCYTES ABSOLUTE COUNT: 0.4 10*9/L (ref 0.3–0.8)
MONOCYTES RELATIVE PERCENT: 13.5 %
NEUTROPHILS ABSOLUTE COUNT: 1.2 10*9/L — ABNORMAL LOW (ref 1.8–7.8)
NEUTROPHILS RELATIVE PERCENT: 44.5 %
NUCLEATED RED BLOOD CELLS: 0 /100{WBCs} (ref <=4)
PLATELET COUNT: 246 10*9/L (ref 150–450)
RED BLOOD CELL COUNT: 4.1 10*12/L — ABNORMAL LOW (ref 4.26–5.60)
RED CELL DISTRIBUTION WIDTH: 15.8 % — ABNORMAL HIGH (ref 12.2–15.2)
WBC ADJUSTED: 2.7 10*9/L — ABNORMAL LOW (ref 3.6–11.2)

## 2020-09-22 LAB — BASIC METABOLIC PANEL
ANION GAP: 7 mmol/L (ref 5–14)
BLOOD UREA NITROGEN: 23 mg/dL (ref 9–23)
BUN / CREAT RATIO: 13
CALCIUM: 9.3 mg/dL (ref 8.7–10.4)
CHLORIDE: 110 mmol/L — ABNORMAL HIGH (ref 98–107)
CO2: 22.4 mmol/L (ref 20.0–31.0)
CREATININE: 1.82 mg/dL — ABNORMAL HIGH
EGFR CKD-EPI AA MALE: 54 mL/min/{1.73_m2} — ABNORMAL LOW (ref >=60)
EGFR CKD-EPI NON-AA MALE: 47 mL/min/{1.73_m2} — ABNORMAL LOW (ref >=60)
GLUCOSE RANDOM: 111 mg/dL (ref 70–179)
POTASSIUM: 4.2 mmol/L (ref 3.5–5.1)
SODIUM: 139 mmol/L (ref 135–145)

## 2020-09-22 LAB — PHOSPHORUS: PHOSPHORUS: 3.1 mg/dL (ref 2.4–5.1)

## 2020-09-22 LAB — HEPATIC FUNCTION PANEL
ALBUMIN: 4.1 g/dL (ref 3.4–5.0)
ALKALINE PHOSPHATASE: 102 U/L (ref 46–116)
ALT (SGPT): 20 U/L (ref 10–49)
AST (SGOT): 21 U/L (ref <=34)
BILIRUBIN DIRECT: 0.2 mg/dL (ref 0.00–0.30)
BILIRUBIN TOTAL: 0.4 mg/dL (ref 0.3–1.2)
PROTEIN TOTAL: 7.5 g/dL (ref 5.7–8.2)

## 2020-09-22 LAB — MAGNESIUM: MAGNESIUM: 2 mg/dL (ref 1.6–2.6)

## 2020-09-23 ENCOUNTER — Encounter
Admit: 2020-09-23 | Discharge: 2020-09-24 | Payer: PRIVATE HEALTH INSURANCE | Attending: Nephrology | Primary: Nephrology

## 2020-09-23 LAB — PROTEIN / CREATININE RATIO, URINE
CREATININE, URINE: 36.9 mg/dL
PROTEIN URINE: 14.3 mg/dL
PROTEIN/CREAT RATIO, URINE: 0.388

## 2020-09-23 LAB — URINALYSIS
BACTERIA: NONE SEEN /HPF
BILIRUBIN UA: NEGATIVE
GLUCOSE UA: NEGATIVE
KETONES UA: NEGATIVE
LEUKOCYTE ESTERASE UA: NEGATIVE
NITRITE UA: NEGATIVE
PH UA: 6 (ref 5.0–9.0)
PROTEIN UA: NEGATIVE
RBC UA: 1 /HPF (ref <3)
SPECIFIC GRAVITY UA: 1.01 (ref 1.005–1.030)
SQUAMOUS EPITHELIAL: <1 /HPF (ref 0–5)
UROBILINOGEN UA: 0.2
WBC UA: <1 /HPF (ref <2)

## 2020-09-23 NOTE — Unmapped (Signed)
AOBP:Right arm  medium cuff   Average:123/83 Pulse:72  1st reading:123/84 Pulse:71  2nd reading:123/84 Pulse:73  3rd reading:123/82 Pulse:71

## 2020-09-23 NOTE — Unmapped (Signed)
Transplant Coordinator, Clinic Visit   Pt seen today by transplant nephrology for follow up, reviewed medications and symptoms.          09/23/20 1443   BP: 123/83   Pulse: 72   Temp: 36.7 ??C (98 ??F)   Weight: 81.6 kg (179 lb 12.8 oz)   Height: 182.9 cm (6')   PainSc: 0-No pain     -Panic attacks getting worse, taking Xanax 0.5 mg daily vs PRN  -Acne on back, new since last visit, slowly resolving    Assessment  BP: 120/80s at home   Lightheaded: denies  Headache: occasional   Hand tremors: denies  Numbness/tingling: Left side of face and L ear, up to 3 x per day, lasts for few seconds  Fevers: denies  Chills/sweats: denies  Shortness of breath: denies  Chest pain or pressure: denies  Palpitations: denies  Abdominal pain: denies  Heart burn: denies  Nausea/vomiting: denies  Diarrhea/constipation: denies  UTI symptoms: denies  Swelling: denies    Good appetite; reports adequate hydration.     Any new medications? no  Immunosuppressant last taken: receives monthly Belatacept infusions    Immunization status: UTD, declines COVID-19 vaccines    Functional Score: 90     Able to carry on normal activity;  Minor signs or symptoms of disease.  Employment status is: works full time    Labs: monthly     I spent a total of 10 minutes with [name] reviewing medications and symptoms.

## 2020-09-28 NOTE — Unmapped (Signed)
Left message to schedule infusion.

## 2020-10-01 NOTE — Unmapped (Signed)
[facility] transplant nephrology clinic visit    assessment and plan  1. s/p kidney transplant 04/28/2019. baseline creatinine 1.5-1.8 mg/dl. stage iiia chr kidney disease. proteinuria < 0.5 g. no donor specific hla ab detected '21.  2. immunosuppression. belatacept 5mg /kg q.month. prednisone 5mg  daily. mycophenolate 180mg  bid d/t leukopenia.  3. hypertension. blood pressure goal <130/80 mmhg.  4. preventive medicine. influenza '21. pcv13 pnuemococcal '19. ppsv23 pneumococcal '19. covid-19 vaccine refused/recommended.    history of present illness    Ryan Moon is a 35 year old gentleman seen in follow up post kidney transplant 04/28/2019. he feels well. +anxiety/panic attacks. no fevers chills or sweats. no headache or lightheaded. no chest pain palpitations cough or shortness of breath. no lower extremity edema. appetite nl. no abdominal pain no n/v/d. no myalgias or arthralgias. no dysuria hematuria or difficulty voiding. all other systems reviewed and negative x10 systems.    past medical hx:  1. 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 baseline creatinine 1.5-1.8 mg/dl.   > kidney bx 01/21: no acute/chr rejection.  2. hypertension  3. anxiety/depression    allergies: nkda    medications: belatacept 5mg /kg q.month, mycophenolate 180mg  bid, prednisone 5mg  daily, losartan 50mg  bid, amlodipine 5mg  pm, aspirin 81mg  daily, mg oxide 133mg  daily, omeprazole 20mg  daily, escitalopram 10mg  daily, buspirone 15mg  bid, alprazolam 0.5mg  prn.    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 chr marijuana    physical exam: t98 p72 bp123/83 wt81.6kg bmi 24.4. wd/wn gentleman appropriate affect and mood. sclera anicteric. mmm no thrush. neck supple no palpable ln. heart rrr nl s1s2 no m/r/g. lungs clear bilateral. abd soft nt/nd. no lower ext edema. msk no synovitis/tophi. skin no rash. neuro alert oriented non focal exam.    labs 09/22/20: wbc2.7 hgb12.5 hct35.8 plts246. UJ811 k4.2 cl110 bicarb22 bun23 cr1.8 glc111 ca9.3 mg2 phos3.1 albumin4.1 liver function panel nl. urine protein/cr 0.388

## 2020-10-05 MED ORDER — MG-PLUS-PROTEIN 133 MG TABLET
ORAL_TABLET | Freq: Every day | ORAL | 11 refills | 100.00000 days
Start: 2020-10-05 — End: ?

## 2020-10-05 NOTE — Unmapped (Signed)
[facility] Refill Coordination Note    Specialty Medication(s) to be Shipped:   Transplant: Myfortic 180mg  and Prednisone 5mg     Other medication(s) to be shipped:      Amlodipine  Losartan  Omeprazole  Aspirin  Mag ox       Ryan Moon, DOB: January 12, 1986  Phone: [phone] (home)       All above HIPAA information was verified with patient.     Was a Nurse, learning disability used for this call? No    Completed refill call assessment today to schedule patient's medication shipment from the [facility] [phone]).  All relevant notes have been reviewed.     Specialty medication(s) and dose(s) confirmed: Regimen is correct and unchanged.   Changes to medications: [name] reports no changes at this time.  Changes to insurance: No  New side effects reported not previously addressed with a pharmacist or physician: None reported  Questions for the pharmacist: No    Confirmed patient received a Conservation officer, historic buildings and a Surveyor, mining with first shipment. The patient will receive a drug information handout for each medication shipped and additional FDA Medication Guides as required.       DISEASE/MEDICATION-SPECIFIC INFORMATION        N/A    SPECIALTY MEDICATION ADHERENCE     Medication Adherence    Patient reported X missed doses in the last month: 0  Specialty Medication: Myfortic 180mg   Patient is on additional specialty medications: Yes  Additional Specialty Medications: Prednisone 5mg   Patient Reported Additional Medication X Missed Doses in the Last Month: 0  Patient is on more than two specialty medications: No              Were doses missed due to medication being on hold? No    Myfortic 180mg    8 days worth of medication on hand.  Prednisone 5mg   8 days worth of medication on hand.        REFERRAL TO PHARMACIST     Referral to the pharmacist: Not needed      [facility]     Shipping address confirmed in Epic.     Delivery Scheduled: Yes, Expected medication delivery date: 10/07/20. Medication will be delivered via UPS to the prescription address in Epic WAM.    Ryan Moon   [facility] Specialty Technician

## 2020-10-06 MED FILL — OMEPRAZOLE 20 MG CAPSULE,DELAYED RELEASE: ORAL | 30 days supply | Qty: 30 | Fill #1

## 2020-10-06 MED FILL — PREDNISONE 5 MG TABLET: ORAL | 30 days supply | Qty: 30 | Fill #4

## 2020-10-06 MED FILL — LOSARTAN 50 MG TABLET: ORAL | 30 days supply | Qty: 60 | Fill #4

## 2020-10-06 MED FILL — MYFORTIC 180 MG TABLET,DELAYED RELEASE: ORAL | 30 days supply | Qty: 60 | Fill #9

## 2020-10-06 MED FILL — ASPIRIN 81 MG TABLET,DELAYED RELEASE: ORAL | 90 days supply | Qty: 90 | Fill #2

## 2020-10-06 MED FILL — AMLODIPINE 5 MG TABLET: ORAL | 30 days supply | Qty: 30 | Fill #4

## 2020-10-06 NOTE — Unmapped (Signed)
Patient has requested a medication refill via EPIC

## 2020-10-07 DIAGNOSIS — D849 Immunodeficiency, unspecified: Principal | ICD-10-CM

## 2020-10-07 DIAGNOSIS — Z940 Kidney transplant status: Principal | ICD-10-CM

## 2020-10-07 MED ORDER — MG-PLUS-PROTEIN 133 MG TABLET
ORAL_TABLET | Freq: Every day | ORAL | 11 refills | 100.00000 days | Status: CP
Start: 2020-10-07 — End: 2021-10-07
  Filled 2020-10-14: qty 100, 100d supply, fill #0

## 2020-10-13 ENCOUNTER — Ambulatory Visit: Admit: 2020-10-13 | Discharge: 2020-10-14 | Payer: PRIVATE HEALTH INSURANCE

## 2020-10-13 LAB — HEPATIC FUNCTION PANEL
ALBUMIN: 4.3 g/dL (ref 3.4–5.0)
ALKALINE PHOSPHATASE: 103 U/L (ref 46–116)
ALT (SGPT): 22 U/L (ref 10–49)
AST (SGOT): 22 U/L (ref <=34)
BILIRUBIN DIRECT: 0.2 mg/dL (ref 0.00–0.30)
BILIRUBIN TOTAL: 0.5 mg/dL (ref 0.3–1.2)
PROTEIN TOTAL: 7.8 g/dL (ref 5.7–8.2)

## 2020-10-13 LAB — CBC W/ AUTO DIFF
BASOPHILS ABSOLUTE COUNT: 0 10*9/L (ref 0.0–0.1)
BASOPHILS RELATIVE PERCENT: 1.3 %
EOSINOPHILS ABSOLUTE COUNT: 0.1 10*9/L (ref 0.0–0.5)
EOSINOPHILS RELATIVE PERCENT: 3.8 %
HEMATOCRIT: 36.3 % — ABNORMAL LOW (ref 39.0–48.0)
HEMOGLOBIN: 12.9 g/dL (ref 12.9–16.5)
LYMPHOCYTES ABSOLUTE COUNT: 0.8 10*9/L — ABNORMAL LOW (ref 1.1–3.6)
LYMPHOCYTES RELATIVE PERCENT: 30.1 %
MEAN CORPUSCULAR HEMOGLOBIN CONC: 35.5 g/dL (ref 32.0–36.0)
MEAN CORPUSCULAR HEMOGLOBIN: 31 pg (ref 25.9–32.4)
MEAN CORPUSCULAR VOLUME: 87.3 fL (ref 77.6–95.7)
MEAN PLATELET VOLUME: 7.6 fL (ref 6.8–10.7)
MONOCYTES ABSOLUTE COUNT: 0.4 10*9/L (ref 0.3–0.8)
MONOCYTES RELATIVE PERCENT: 15.9 %
NEUTROPHILS ABSOLUTE COUNT: 1.4 10*9/L — ABNORMAL LOW (ref 1.8–7.8)
NEUTROPHILS RELATIVE PERCENT: 48.9 %
PLATELET COUNT: 242 10*9/L (ref 150–450)
RED BLOOD CELL COUNT: 4.16 10*12/L — ABNORMAL LOW (ref 4.26–5.60)
RED CELL DISTRIBUTION WIDTH: 13.9 % (ref 12.2–15.2)
WBC ADJUSTED: 2.8 10*9/L — ABNORMAL LOW (ref 3.6–11.2)

## 2020-10-13 LAB — BASIC METABOLIC PANEL
ANION GAP: 8 mmol/L (ref 5–14)
BLOOD UREA NITROGEN: 22 mg/dL (ref 9–23)
BUN / CREAT RATIO: 14
CALCIUM: 9.8 mg/dL (ref 8.7–10.4)
CHLORIDE: 103 mmol/L (ref 98–107)
CO2: 23 mmol/L (ref 20.0–31.0)
CREATININE: 1.58 mg/dL — ABNORMAL HIGH
EGFR CKD-EPI AA MALE: 64 mL/min/{1.73_m2} (ref >=60)
EGFR CKD-EPI NON-AA MALE: 55 mL/min/{1.73_m2} — ABNORMAL LOW (ref >=60)
GLUCOSE RANDOM: 107 mg/dL (ref 70–179)
POTASSIUM: 3.8 mmol/L (ref 3.4–4.8)
SODIUM: 134 mmol/L — ABNORMAL LOW (ref 135–145)

## 2020-10-13 LAB — SLIDE REVIEW

## 2020-10-13 LAB — MAGNESIUM: MAGNESIUM: 2 mg/dL (ref 1.6–2.6)

## 2020-10-13 LAB — PHOSPHORUS: PHOSPHORUS: 2.8 mg/dL (ref 2.4–5.1)

## 2020-10-13 MED ADMIN — belatacept (NULOJIX) 400 mg in sodium chloride (NS) 0.9 % 100 mL IVPB: 5 mg/kg | INTRAVENOUS | @ 13:00:00 | Stop: 2020-10-13

## 2020-10-13 NOTE — Unmapped (Signed)
0981 Patient arrived to the Transplant Infusion Room today for belatacept 400 mg, Condition: well; Mobility: ambulating; accompanied by self.   See Flowsheet and MAR for all details of visit.   0850 VS stable.  1914 PIV placed and secured with coban; labs collected and sent, urine no orders found.  0857 Infusion initiated.  7829 Infusion complete.   0931 VS stable, Line flushed with NS, PIV removed and secured with coban. Patient left clinic today, Condition: well; Mobility: ambulating; accompanied by self.

## 2020-10-21 NOTE — Unmapped (Signed)
Patient reports metal splinter in his finger that has increased redness and pain in his hand today.  Advised to go to local urgent care or ED for possible removal of splinter and possible antibiotics.  Pt advised he will go to his local PCP.

## 2020-11-08 NOTE — Unmapped (Signed)
[facility] Refill Coordination Note    Specialty Medication(s) to be Shipped:   Transplant: Myfortic 180mg  and Prednisone 5mg     Other medication(s) to be shipped: amlodipine, losartan, omeprazole     Ryan Moon, DOB: 02/10/1986  Phone: [phone] (home)       All above HIPAA information was verified with patient.     Was a Nurse, learning disability used for this call? No    Completed refill call assessment today to schedule patient's medication shipment from the [facility] [phone]).  All relevant notes have been reviewed.     Specialty medication(s) and dose(s) confirmed: Regimen is correct and unchanged.   Changes to medications: Ryan Moon reports no changes at this time.  Changes to insurance: No  New side effects reported not previously addressed with a pharmacist or physician: None reported  Questions for the pharmacist: No    Confirmed patient received a Conservation officer, historic buildings and a Surveyor, mining with first shipment. The patient will receive a drug information handout for each medication shipped and additional FDA Medication Guides as required.       DISEASE/MEDICATION-SPECIFIC INFORMATION        N/A    SPECIALTY MEDICATION ADHERENCE     Medication Adherence    Specialty Medication: Myfortic 180mg   Patient is on additional specialty medications: Yes  Additional Specialty Medications: Prednisone 5mg   Patient is on more than two specialty medications: No  Any gaps in refill history greater than 2 weeks in the last 3 months: no  Demonstrates understanding of importance of adherence: yes  Informant: patient              Were doses missed due to medication being on hold? No    Myfortic 180mg : Patient has 5 days of medication on hand  Prednisone 5mg : Patient has 5 days of medication on hand    REFERRAL TO PHARMACIST     Referral to the pharmacist: Not needed      [facility]     Shipping address confirmed in Epic.     Delivery Scheduled: Yes, Expected medication delivery date: 6/9.     Medication will be delivered via Next Day Courier to the prescription address in Epic WAM.    Ryan Moon   [facility] Specialty Technician

## 2020-11-09 MED FILL — AMLODIPINE 5 MG TABLET: ORAL | 30 days supply | Qty: 30 | Fill #5

## 2020-11-09 MED FILL — LOSARTAN 50 MG TABLET: ORAL | 30 days supply | Qty: 60 | Fill #5

## 2020-11-09 MED FILL — OMEPRAZOLE 20 MG CAPSULE,DELAYED RELEASE: ORAL | 30 days supply | Qty: 30 | Fill #2

## 2020-11-09 MED FILL — PREDNISONE 5 MG TABLET: ORAL | 30 days supply | Qty: 30 | Fill #5

## 2020-11-09 MED FILL — MYFORTIC 180 MG TABLET,DELAYED RELEASE: ORAL | 30 days supply | Qty: 60 | Fill #10

## 2020-11-14 ENCOUNTER — Encounter: Admit: 2020-11-14 | Discharge: 2020-11-15 | Payer: PRIVATE HEALTH INSURANCE

## 2020-11-14 LAB — BASIC METABOLIC PANEL
ANION GAP: 6 mmol/L (ref 5–14)
BLOOD UREA NITROGEN: 17 mg/dL (ref 9–23)
BUN / CREAT RATIO: 10
CALCIUM: 9.1 mg/dL (ref 8.7–10.4)
CHLORIDE: 104 mmol/L (ref 98–107)
CO2: 24 mmol/L (ref 20.0–31.0)
CREATININE: 1.7 mg/dL — ABNORMAL HIGH
EGFR CKD-EPI (2021) MALE: 53 mL/min/{1.73_m2} — ABNORMAL LOW (ref >=60)
GLUCOSE RANDOM: 97 mg/dL (ref 70–99)
POTASSIUM: 3.7 mmol/L (ref 3.5–5.1)
SODIUM: 134 mmol/L — ABNORMAL LOW (ref 135–145)

## 2020-11-14 LAB — CBC W/ AUTO DIFF
BASOPHILS ABSOLUTE COUNT: 0 10*9/L (ref 0.0–0.1)
BASOPHILS RELATIVE PERCENT: 0.7 %
EOSINOPHILS ABSOLUTE COUNT: 0.1 10*9/L (ref 0.0–0.5)
EOSINOPHILS RELATIVE PERCENT: 4.7 %
HEMATOCRIT: 35.6 % — ABNORMAL LOW (ref 39.0–48.0)
HEMOGLOBIN: 12.5 g/dL — ABNORMAL LOW (ref 12.9–16.5)
LYMPHOCYTES ABSOLUTE COUNT: 1 10*9/L — ABNORMAL LOW (ref 1.1–3.6)
LYMPHOCYTES RELATIVE PERCENT: 43.7 %
MEAN CORPUSCULAR HEMOGLOBIN CONC: 35.2 g/dL (ref 32.0–36.0)
MEAN CORPUSCULAR HEMOGLOBIN: 31.4 pg (ref 25.9–32.4)
MEAN CORPUSCULAR VOLUME: 89.2 fL (ref 77.6–95.7)
MEAN PLATELET VOLUME: 7.3 fL (ref 6.8–10.7)
MONOCYTES ABSOLUTE COUNT: 0.3 10*9/L (ref 0.3–0.8)
MONOCYTES RELATIVE PERCENT: 15.2 %
NEUTROPHILS ABSOLUTE COUNT: 0.8 10*9/L — ABNORMAL LOW (ref 1.8–7.8)
NEUTROPHILS RELATIVE PERCENT: 35.7 %
PLATELET COUNT: 211 10*9/L (ref 150–450)
RED BLOOD CELL COUNT: 3.99 10*12/L — ABNORMAL LOW (ref 4.26–5.60)
RED CELL DISTRIBUTION WIDTH: 13.6 % (ref 12.2–15.2)
WBC ADJUSTED: 2.2 10*9/L — ABNORMAL LOW (ref 3.6–11.2)

## 2020-11-14 LAB — HEPATIC FUNCTION PANEL
ALBUMIN: 3.9 g/dL (ref 3.4–5.0)
ALKALINE PHOSPHATASE: 90 U/L (ref 46–116)
ALT (SGPT): 25 U/L (ref 10–49)
AST (SGOT): 17 U/L (ref <=34)
BILIRUBIN DIRECT: 0.1 mg/dL (ref 0.00–0.30)
BILIRUBIN TOTAL: 0.4 mg/dL (ref 0.3–1.2)
PROTEIN TOTAL: 6.7 g/dL (ref 5.7–8.2)

## 2020-11-14 LAB — SLIDE REVIEW

## 2020-11-14 LAB — PHOSPHORUS: PHOSPHORUS: 2.5 mg/dL (ref 2.4–5.1)

## 2020-11-14 LAB — MAGNESIUM: MAGNESIUM: 1.7 mg/dL (ref 1.6–2.6)

## 2020-11-14 MED ADMIN — belatacept (NULOJIX) 400 mg in sodium chloride (NS) 0.9 % 100 mL IVPB: 5 mg/kg | INTRAVENOUS | @ 13:00:00 | Stop: 2020-11-14

## 2020-11-14 NOTE — Unmapped (Signed)
0820 Patient arrived to the Transplant Infusion Room today for belatacept 400mg , Condition: well; Mobility: ambulating; accompanied by self.   See Flowsheet and MAR for all details of visit.   0825 VS stable.  3664 PIV placed and secured with coban; labs collected and sent, urine no orders [id number] 0839 Infusion initiated.  0910 Infusion complete.   0915 VS stable, Line flushed with NS, PIV removed and secured with coban. Patient left clinic today, Condition: well; Mobility: ambulating; accompanied by self.

## 2020-11-30 NOTE — Unmapped (Signed)
[facility] Refill Coordination Note    Specialty Medication(s) to be Shipped:   Transplant: Myfortic 180mg  and Prednisone 5mg     Other medication(s) to be shipped: amlodipine, losartan and omeprazole     Ryan Moon, DOB: March 01, 1986  Phone: [phone] (home)       All above HIPAA information was verified with patient.     Was a Nurse, learning disability used for this call? No    Completed refill call assessment today to schedule patient's medication shipment from the [facility] [phone]).  All relevant notes have been reviewed.     Specialty medication(s) and dose(s) confirmed: Regimen is correct and unchanged.   Changes to medications: Ryan Moon reports no changes at this time.  Changes to insurance: No  New side effects reported not previously addressed with a pharmacist or physician: None reported  Questions for the pharmacist: No    Confirmed patient received a Conservation officer, historic buildings and a Surveyor, mining with first shipment. The patient will receive a drug information handout for each medication shipped and additional FDA Medication Guides as required.       DISEASE/MEDICATION-SPECIFIC INFORMATION        N/A    SPECIALTY MEDICATION ADHERENCE     Medication Adherence    Patient reported X missed doses in the last month: 0  Specialty Medication: Myfortic 180mg   Patient is on additional specialty medications: Yes  Additional Specialty Medications: Prednisone 5mg   Patient Reported Additional Medication X Missed Doses in the Last Month: 0  Patient is on more than two specialty medications: No        Were doses missed due to medication being on hold? No    Myfortic 180 mg: 9 days of medicine on hand   Prednisone 5 mg: 9 days of medicine on hand     REFERRAL TO PHARMACIST     Referral to the pharmacist: Not needed      [facility]     Shipping address confirmed in Epic.     Delivery Scheduled: Yes, Expected medication delivery date: 12/07/2020.     Medication will be delivered via UPS to the prescription address in Epic WAM.    Ryan Moon [facility] Specialty Technician

## 2020-12-06 MED FILL — MYFORTIC 180 MG TABLET,DELAYED RELEASE: ORAL | 30 days supply | Qty: 60 | Fill #11

## 2020-12-06 MED FILL — PREDNISONE 5 MG TABLET: ORAL | 30 days supply | Qty: 30 | Fill #6

## 2020-12-06 MED FILL — LOSARTAN 50 MG TABLET: ORAL | 30 days supply | Qty: 60 | Fill #6

## 2020-12-06 MED FILL — AMLODIPINE 5 MG TABLET: ORAL | 30 days supply | Qty: 30 | Fill #6

## 2020-12-06 MED FILL — OMEPRAZOLE 20 MG CAPSULE,DELAYED RELEASE: ORAL | 30 days supply | Qty: 30 | Fill #3

## 2020-12-12 ENCOUNTER — Encounter: Admit: 2020-12-12 | Discharge: 2020-12-13 | Payer: PRIVATE HEALTH INSURANCE

## 2020-12-12 LAB — CBC W/ AUTO DIFF
BASOPHILS ABSOLUTE COUNT: 0 10*9/L (ref 0.0–0.1)
BASOPHILS RELATIVE PERCENT: 0.9 %
EOSINOPHILS ABSOLUTE COUNT: 0.1 10*9/L (ref 0.0–0.5)
EOSINOPHILS RELATIVE PERCENT: 4.6 %
HEMATOCRIT: 35.7 % — ABNORMAL LOW (ref 39.0–48.0)
HEMOGLOBIN: 12.5 g/dL — ABNORMAL LOW (ref 12.9–16.5)
LYMPHOCYTES ABSOLUTE COUNT: 1.3 10*9/L (ref 1.1–3.6)
LYMPHOCYTES RELATIVE PERCENT: 56 %
MEAN CORPUSCULAR HEMOGLOBIN CONC: 35 g/dL (ref 32.0–36.0)
MEAN CORPUSCULAR HEMOGLOBIN: 31.1 pg (ref 25.9–32.4)
MEAN CORPUSCULAR VOLUME: 88.6 fL (ref 77.6–95.7)
MEAN PLATELET VOLUME: 7.7 fL (ref 6.8–10.7)
MONOCYTES ABSOLUTE COUNT: 0.3 10*9/L (ref 0.3–0.8)
MONOCYTES RELATIVE PERCENT: 14.4 %
NEUTROPHILS ABSOLUTE COUNT: 0.6 10*9/L — ABNORMAL LOW (ref 1.8–7.8)
NEUTROPHILS RELATIVE PERCENT: 24.1 %
PLATELET COUNT: 204 10*9/L (ref 150–450)
RED BLOOD CELL COUNT: 4.03 10*12/L — ABNORMAL LOW (ref 4.26–5.60)
RED CELL DISTRIBUTION WIDTH: 14.1 % (ref 12.2–15.2)
WBC ADJUSTED: 2.4 10*9/L — ABNORMAL LOW (ref 3.6–11.2)

## 2020-12-12 LAB — BASIC METABOLIC PANEL
ANION GAP: 6 mmol/L (ref 5–14)
BLOOD UREA NITROGEN: 25 mg/dL — ABNORMAL HIGH (ref 9–23)
BUN / CREAT RATIO: 16
CALCIUM: 9.3 mg/dL (ref 8.7–10.4)
CHLORIDE: 106 mmol/L (ref 98–107)
CO2: 25 mmol/L (ref 20.0–31.0)
CREATININE: 1.56 mg/dL — ABNORMAL HIGH
EGFR CKD-EPI (2021) MALE: 59 mL/min/{1.73_m2} — ABNORMAL LOW (ref >=60)
GLUCOSE RANDOM: 88 mg/dL (ref 70–99)
POTASSIUM: 3.8 mmol/L (ref 3.4–4.8)
SODIUM: 137 mmol/L (ref 135–145)

## 2020-12-12 LAB — HEPATIC FUNCTION PANEL
ALBUMIN: 3.9 g/dL (ref 3.4–5.0)
ALKALINE PHOSPHATASE: 76 U/L (ref 46–116)
ALT (SGPT): 17 U/L (ref 10–49)
AST (SGOT): 17 U/L (ref <=34)
BILIRUBIN DIRECT: <0.1 mg/dL (ref 0.00–0.30)
BILIRUBIN TOTAL: 0.3 mg/dL (ref 0.3–1.2)
PROTEIN TOTAL: 7 g/dL (ref 5.7–8.2)

## 2020-12-12 LAB — SLIDE REVIEW

## 2020-12-12 LAB — PHOSPHORUS: PHOSPHORUS: 3.2 mg/dL (ref 2.4–5.1)

## 2020-12-12 LAB — MAGNESIUM: MAGNESIUM: 1.8 mg/dL (ref 1.6–2.6)

## 2020-12-12 MED ADMIN — belatacept (NULOJIX) 400 mg in sodium chloride (NS) 0.9 % 100 mL IVPB: 5 mg/kg | INTRAVENOUS | @ 13:00:00 | Stop: 2020-12-12

## 2020-12-12 NOTE — Unmapped (Signed)
0800 Patient arrived to the Transplant Infusion Room today for belatacept 400mg , Condition: well; Mobility: ambulating; accompanied by self.   See Flowsheet and MAR for all details of visit.   0809 VS stable.  0820 PIV placed and secured with coban; labs collected and sent, urine no orders found.  0914 Infusion initiated.  3086 Infusion complete.   0950 VS stable, Line flushed with NS, PIV removed and secured with coban. Patient left clinic today, Condition: well; Mobility: ambulating; accompanied by self.

## 2020-12-21 DIAGNOSIS — D849 Immunodeficiency, unspecified: Principal | ICD-10-CM

## 2020-12-21 DIAGNOSIS — Z940 Kidney transplant status: Principal | ICD-10-CM

## 2021-01-02 DIAGNOSIS — Z940 Kidney transplant status: Principal | ICD-10-CM

## 2021-01-02 MED ORDER — MYCOPHENOLATE SODIUM 180 MG TABLET,DELAYED RELEASE
ORAL_TABLET | Freq: Two times a day (BID) | ORAL | 11 refills | 30 days | Status: CP
Start: 2021-01-02 — End: 2022-01-02
  Filled 2021-01-04: qty 60, 30d supply, fill #0

## 2021-01-02 NOTE — Unmapped (Signed)
[facility] Clinical Assessment & Refill Coordination Note    Ryan Moon, DOB: 1986/04/08  Phone: [phone] (home)     All above HIPAA information was verified with patient.     Was a Nurse, learning disability used for this call? No    Specialty Medication(s):   Transplant: Myfortic 180mg  and Prednisone 5mg      Current Outpatient Medications   Medication Sig Dispense Refill   ??? ALPRAZolam (XANAX) 0.5 MG tablet Take 0.5 mg by mouth two (2) times a day as needed.     ??? amLODIPine (NORVASC) 5 MG tablet Take 1 tablet (5 mg total) by mouth every evening. 30 tablet 11   ??? aspirin (ECOTRIN) 81 MG tablet Take 1 tablet (81 mg total) by mouth daily. 30 tablet 11   ??? belatacept (NULOJIX) 250 mg SolR Belatacept  5 mg/kg every 2 weeks for 5 doses then once every 4 weeks 14.5 mL 0   ??? busPIRone (BUSPAR) 15 MG tablet Take 15 mg by mouth Two (2) times a day.     ??? escitalopram oxalate (LEXAPRO) 10 MG tablet Take 10 mg by mouth daily.     ??? losartan (COZAAR) 50 MG tablet Take 1 tablet (50 mg total) by mouth Two (2) times a day. 60 tablet 11   ??? magnesium oxide-Mg AA chelate (MAGNESIUM, AMINO ACID CHELATE,) 133 mg Tab Take 1 tablet by mouth daily. 100 tablet 11   ??? mycophenolate (MYFORTIC) 180 MG EC tablet Take 1 tablet (180 mg total) by mouth Two (2) times a day. 60 tablet 11   ??? omeprazole (PRILOSEC) 20 MG capsule Take 1 capsule (20 mg total) by mouth daily. 30 capsule 11   ??? predniSONE (DELTASONE) 5 MG tablet Take 1 tablet (5 mg total) by mouth daily. 30 tablet 11     No current facility-administered medications for this visit.        Changes to medications: Ryan Moon reports no changes at this time.    Allergies   Allergen Reactions   ??? Acetaminophen Nausea And Vomiting       Changes to allergies: No    SPECIALTY MEDICATION ADHERENCE     Myfortic 180mg   : 7 days of medicine on hand   Prednisone 5mg   : 7 days of medicine on hand     Medication Adherence    Patient reported X missed doses in the last month: 0  Specialty Medication: myfortic 180mg   Patient is on additional specialty medications: Yes  Additional Specialty Medications: Prednisone 5mg   Patient Reported Additional Medication X Missed Doses in the Last Month: 0          Specialty medication(s) dose(s) confirmed: Regimen is correct and unchanged.     Are there any concerns with adherence? No    Adherence counseling provided? Not needed    CLINICAL MANAGEMENT AND INTERVENTION      Clinical Benefit Assessment:    Do you feel the medicine is effective or helping your condition? Yes    Clinical Benefit counseling provided? Not needed    Adverse Effects Assessment:    Are you experiencing any side effects? No    Are you experiencing difficulty administering your medicine? No    Quality of Life Assessment:         How many days over the past month did your transplant  keep you from your normal activities? For example, brushing your teeth or getting up in the morning. 0    Have you  discussed this with your provider? Not needed    Acute Infection Status:    Acute infections noted within Epic:  No active infections  Patient reported infection: None    Therapy Appropriateness:    Is therapy appropriate? Yes, therapy is appropriate and should be continued    DISEASE/MEDICATION-SPECIFIC INFORMATION      N/A    PATIENT SPECIFIC NEEDS     - Does the patient have any physical, cognitive, or cultural barriers? No    - Is the patient high risk? Yes, patient is taking a REMS drug. Medication is dispensed in compliance with REMS program    - Does the patient require a Care Management Plan? No     - Does the patient require physician intervention or other additional services (i.e. nutrition, smoking cessation, social work)? No      SHIPPING     Specialty Medication(s) to be Shipped:   Transplant: Myfortic 180mg  and Prednisone 5mg     Other medication(s) to be shipped: norvasc, cozaar, prilosec, aspirin     Changes to insurance: No    Delivery Scheduled: Yes, Expected medication delivery date: 01/05/2021.  However, Rx request for refills was sent to the provider as there are none remaining.     Medication will be delivered via UPS to the confirmed prescription address in [facility].    The patient will receive a drug information handout for each medication shipped and additional FDA Medication Guides as required.  Verified that patient has previously received a Conservation officer, historic buildings and a Surveyor, mining.    The patient or caregiver noted above participated in the development of this care plan and knows that they can request review of or adjustments to the care plan at any time.      All of the patient's questions and concerns have been addressed.    Ryan Moon   [facility] Specialty Pharmacist

## 2021-01-04 MED FILL — PREDNISONE 5 MG TABLET: ORAL | 30 days supply | Qty: 30 | Fill #7

## 2021-01-04 MED FILL — LOSARTAN 50 MG TABLET: ORAL | 30 days supply | Qty: 60 | Fill #7

## 2021-01-04 MED FILL — ASPIRIN 81 MG TABLET,DELAYED RELEASE: ORAL | 90 days supply | Qty: 90 | Fill #3

## 2021-01-04 MED FILL — AMLODIPINE 5 MG TABLET: ORAL | 30 days supply | Qty: 30 | Fill #7

## 2021-01-04 MED FILL — OMEPRAZOLE 20 MG CAPSULE,DELAYED RELEASE: ORAL | 30 days supply | Qty: 30 | Fill #4

## 2021-01-16 ENCOUNTER — Ambulatory Visit: Admit: 2021-01-16 | Discharge: 2021-01-17 | Payer: PRIVATE HEALTH INSURANCE

## 2021-01-16 DIAGNOSIS — Z940 Kidney transplant status: Principal | ICD-10-CM

## 2021-01-16 DIAGNOSIS — D849 Immunodeficiency, unspecified: Principal | ICD-10-CM

## 2021-01-16 DIAGNOSIS — Z79899 Other long term (current) drug therapy: Principal | ICD-10-CM

## 2021-01-16 LAB — CBC W/ AUTO DIFF
BASOPHILS ABSOLUTE COUNT: 0 10*9/L (ref 0.0–0.1)
BASOPHILS RELATIVE PERCENT: 0.7 %
EOSINOPHILS ABSOLUTE COUNT: 0.1 10*9/L (ref 0.0–0.5)
EOSINOPHILS RELATIVE PERCENT: 4.2 %
HEMATOCRIT: 34.2 % — ABNORMAL LOW (ref 39.0–48.0)
HEMOGLOBIN: 12 g/dL — ABNORMAL LOW (ref 12.9–16.5)
LYMPHOCYTES ABSOLUTE COUNT: 0.9 10*9/L — ABNORMAL LOW (ref 1.1–3.6)
LYMPHOCYTES RELATIVE PERCENT: 46.3 %
MEAN CORPUSCULAR HEMOGLOBIN CONC: 34.9 g/dL (ref 32.0–36.0)
MEAN CORPUSCULAR HEMOGLOBIN: 31 pg (ref 25.9–32.4)
MEAN CORPUSCULAR VOLUME: 88.7 fL (ref 77.6–95.7)
MEAN PLATELET VOLUME: 7.5 fL (ref 6.8–10.7)
MONOCYTES ABSOLUTE COUNT: 0.3 10*9/L (ref 0.3–0.8)
MONOCYTES RELATIVE PERCENT: 17.6 %
NEUTROPHILS ABSOLUTE COUNT: 0.6 10*9/L — ABNORMAL LOW (ref 1.8–7.8)
NEUTROPHILS RELATIVE PERCENT: 31.2 %
PLATELET COUNT: 210 10*9/L (ref 150–450)
RED BLOOD CELL COUNT: 3.86 10*12/L — ABNORMAL LOW (ref 4.26–5.60)
RED CELL DISTRIBUTION WIDTH: 13.8 % (ref 12.2–15.2)
WBC ADJUSTED: 1.9 10*9/L — ABNORMAL LOW (ref 3.6–11.2)

## 2021-01-16 LAB — HEPATIC FUNCTION PANEL
ALBUMIN: 3.7 g/dL (ref 3.4–5.0)
ALKALINE PHOSPHATASE: 67 U/L (ref 46–116)
ALT (SGPT): 16 U/L (ref 10–49)
AST (SGOT): 15 U/L (ref <=34)
BILIRUBIN DIRECT: <0.1 mg/dL (ref 0.00–0.30)
BILIRUBIN TOTAL: 0.2 mg/dL — ABNORMAL LOW (ref 0.3–1.2)
PROTEIN TOTAL: 6.4 g/dL (ref 5.7–8.2)

## 2021-01-16 LAB — BASIC METABOLIC PANEL
ANION GAP: 4 mmol/L — ABNORMAL LOW (ref 5–14)
BLOOD UREA NITROGEN: 21 mg/dL (ref 9–23)
BUN / CREAT RATIO: 14
CALCIUM: 8.8 mg/dL (ref 8.7–10.4)
CHLORIDE: 108 mmol/L — ABNORMAL HIGH (ref 98–107)
CO2: 26 mmol/L (ref 20.0–31.0)
CREATININE: 1.51 mg/dL — ABNORMAL HIGH
EGFR CKD-EPI (2021) MALE: 61 mL/min/{1.73_m2} (ref >=60)
GLUCOSE RANDOM: 89 mg/dL (ref 70–99)
POTASSIUM: 3.8 mmol/L (ref 3.4–4.8)
SODIUM: 138 mmol/L (ref 135–145)

## 2021-01-16 LAB — VITAMIN B12: VITAMIN B-12: 894 pg/mL (ref 211–911)

## 2021-01-16 LAB — PHOSPHORUS: PHOSPHORUS: 3.3 mg/dL (ref 2.4–5.1)

## 2021-01-16 LAB — SLIDE REVIEW

## 2021-01-16 LAB — MAGNESIUM: MAGNESIUM: 1.8 mg/dL (ref 1.6–2.6)

## 2021-01-16 MED ADMIN — belatacept (NULOJIX) 400 mg in sodium chloride (NS) 0.9 % 100 mL IVPB: 5 mg/kg | INTRAVENOUS | @ 13:00:00 | Stop: 2021-01-16

## 2021-01-16 NOTE — Unmapped (Signed)
0820 Patient arrived to the Transplant Infusion Room today for belatacept 400mg , Condition: well; Mobility: ambulating; accompanied by self.   See Flowsheet and MAR for all details of visit.   1610 VS stable.  9604 PIV placed and secured with coban; labs collected and sent, urine no orders found.  0848 Infusion initiated. Patient educated to notify nursing staff if they experience urticaria, erythema, nausea, shortness of breath, nausea, metal taste in mouth, excessive oral or postnasal drainage and any other changes in status which could be side effects from initiating this medication.  0920 Infusion complete.   0930 VS stable, Line flushed with NS, PIV removed and secured with coban. Patient left clinic today, Condition: well; Mobility: ambulating; accompanied by self.

## 2021-01-25 NOTE — Unmapped (Signed)
[facility] Refill Coordination Note    Specialty Medication(s) to be Shipped:   Transplant: Myfortic 180mg  and Prednisone 5mg     Other medication(s) to be shipped: magnesium, omeprazole, amlodipine and losartan     Ryan Moon, DOB: 09/16/1985  Phone: [phone] (home)       All above HIPAA information was verified with patient.     Was a Nurse, learning disability used for this call? No    Completed refill call assessment today to schedule patient's medication shipment from the [facility] [phone]).  All relevant notes have been reviewed.     Specialty medication(s) and dose(s) confirmed: Regimen is correct and unchanged.   Changes to medications: Bentley reports no changes at this time.  Changes to insurance: No  New side effects reported not previously addressed with a pharmacist or physician: None reported  Questions for the pharmacist: No    Confirmed patient received a Conservation officer, historic buildings and a Surveyor, mining with first shipment. The patient will receive a drug information handout for each medication shipped and additional FDA Medication Guides as required.       DISEASE/MEDICATION-SPECIFIC INFORMATION        N/A    SPECIALTY MEDICATION ADHERENCE     Medication Adherence    Patient reported X missed doses in the last month: 0  Specialty Medication: Myfortic 180mg   Patient is on additional specialty medications: Yes  Additional Specialty Medications: Prednisone 5mg   Patient Reported Additional Medication X Missed Doses in the Last Month: 0  Patient is on more than two specialty medications: No        Were doses missed due to medication being on hold? No    Myfortic 180 mg: 12 days of medicine on hand   Prednisone 5 mg: 12 days of medicine on hand     REFERRAL TO PHARMACIST     Referral to the pharmacist: Not needed      [facility]     Shipping address confirmed in Epic.     Delivery Scheduled: Yes, Expected medication delivery date: 02/03/2021.     Medication will be delivered via UPS  to the prescription address in Epic WAM.    [name] Specialty Technician

## 2021-02-02 MED FILL — MG-PLUS-PROTEIN 133 MG TABLET: ORAL | 100 days supply | Qty: 100 | Fill #1

## 2021-02-02 MED FILL — PREDNISONE 5 MG TABLET: ORAL | 30 days supply | Qty: 30 | Fill #8

## 2021-02-02 MED FILL — MYFORTIC 180 MG TABLET,DELAYED RELEASE: ORAL | 30 days supply | Qty: 60 | Fill #1

## 2021-02-02 MED FILL — LOSARTAN 50 MG TABLET: ORAL | 30 days supply | Qty: 60 | Fill #8

## 2021-02-02 MED FILL — OMEPRAZOLE 20 MG CAPSULE,DELAYED RELEASE: ORAL | 30 days supply | Qty: 30 | Fill #5

## 2021-02-02 MED FILL — AMLODIPINE 5 MG TABLET: ORAL | 30 days supply | Qty: 30 | Fill #8

## 2021-02-13 ENCOUNTER — Ambulatory Visit: Admit: 2021-02-13 | Discharge: 2021-02-14 | Payer: PRIVATE HEALTH INSURANCE

## 2021-02-13 DIAGNOSIS — Z940 Kidney transplant status: Principal | ICD-10-CM

## 2021-02-13 DIAGNOSIS — Z79899 Other long term (current) drug therapy: Principal | ICD-10-CM

## 2021-02-13 LAB — HEPATIC FUNCTION PANEL
ALBUMIN: 4 g/dL (ref 3.4–5.0)
ALKALINE PHOSPHATASE: 71 U/L (ref 46–116)
ALT (SGPT): 17 U/L (ref 10–49)
AST (SGOT): 17 U/L (ref <=34)
BILIRUBIN DIRECT: 0.1 mg/dL (ref 0.00–0.30)
BILIRUBIN TOTAL: 0.3 mg/dL (ref 0.3–1.2)
PROTEIN TOTAL: 7 g/dL (ref 5.7–8.2)

## 2021-02-13 LAB — CBC W/ AUTO DIFF
BASOPHILS ABSOLUTE COUNT: 0 10*9/L (ref 0.0–0.1)
BASOPHILS RELATIVE PERCENT: 0.6 %
EOSINOPHILS ABSOLUTE COUNT: 0.1 10*9/L (ref 0.0–0.5)
EOSINOPHILS RELATIVE PERCENT: 4.5 %
HEMATOCRIT: 35.1 % — ABNORMAL LOW (ref 39.0–48.0)
HEMOGLOBIN: 12.2 g/dL — ABNORMAL LOW (ref 12.9–16.5)
LYMPHOCYTES ABSOLUTE COUNT: 0.9 10*9/L — ABNORMAL LOW (ref 1.1–3.6)
LYMPHOCYTES RELATIVE PERCENT: 53.9 %
MEAN CORPUSCULAR HEMOGLOBIN CONC: 34.7 g/dL (ref 32.0–36.0)
MEAN CORPUSCULAR HEMOGLOBIN: 30.9 pg (ref 25.9–32.4)
MEAN CORPUSCULAR VOLUME: 89.2 fL (ref 77.6–95.7)
MEAN PLATELET VOLUME: 7.3 fL (ref 6.8–10.7)
MONOCYTES ABSOLUTE COUNT: 0.3 10*9/L (ref 0.3–0.8)
MONOCYTES RELATIVE PERCENT: 15.5 %
NEUTROPHILS ABSOLUTE COUNT: 0.4 10*9/L — CL (ref 1.8–7.8)
NEUTROPHILS RELATIVE PERCENT: 25.5 %
PLATELET COUNT: 195 10*9/L (ref 150–450)
RED BLOOD CELL COUNT: 3.94 10*12/L — ABNORMAL LOW (ref 4.26–5.60)
RED CELL DISTRIBUTION WIDTH: 14.4 % (ref 12.2–15.2)
WBC ADJUSTED: 1.8 10*9/L — ABNORMAL LOW (ref 3.6–11.2)

## 2021-02-13 LAB — MAGNESIUM: MAGNESIUM: 1.9 mg/dL (ref 1.6–2.6)

## 2021-02-13 LAB — BASIC METABOLIC PANEL
ANION GAP: 5 mmol/L (ref 5–14)
BLOOD UREA NITROGEN: 28 mg/dL — ABNORMAL HIGH (ref 9–23)
BUN / CREAT RATIO: 20
CALCIUM: 9.5 mg/dL (ref 8.7–10.4)
CHLORIDE: 104 mmol/L (ref 98–107)
CO2: 26 mmol/L (ref 20.0–31.0)
CREATININE: 1.41 mg/dL — ABNORMAL HIGH
EGFR CKD-EPI (2021) MALE: 66 mL/min/{1.73_m2} (ref >=60)
GLUCOSE RANDOM: 88 mg/dL (ref 70–179)
POTASSIUM: 3.8 mmol/L (ref 3.4–4.8)
SODIUM: 135 mmol/L (ref 135–145)

## 2021-02-13 LAB — SLIDE REVIEW

## 2021-02-13 LAB — PHOSPHORUS: PHOSPHORUS: 3.7 mg/dL (ref 2.4–5.1)

## 2021-02-13 MED ADMIN — belatacept (NULOJIX) 400 mg in sodium chloride (NS) 0.9 % 100 mL IVPB: 5 mg/kg | INTRAVENOUS | @ 13:00:00 | Stop: 2021-02-13

## 2021-02-13 NOTE — Unmapped (Signed)
4401 Patient arrived to the Transplant Infusion Room today for belatacept 400 mg, Condition: well; Mobility: ambulating; accompanied by self.   See Flowsheet and MAR for all details of visit.   0827 VS stable.  0845 PIV placed and secured with coban; labs collected and sent, urine no orders found.  0847 Infusion initiated. Patient educated to notify nursing staff if they experience urticaria, erythema, nausea, shortness of breath, nausea, metal taste in mouth, excessive oral or postnasal drainage and any other changes in status which could be side effects from initiating this medication.  0900 Infusion paused due to infiltrated PIV.   0904 New PIV insertered and infusion restarted.  0272 Infusion complete.   0930 VS stable, Line flushed with NS, PIV removed and secured with coban. Patient left clinic today, Condition: well; Mobility: ambulating; accompanied by self.

## 2021-02-14 DIAGNOSIS — D72819 Decreased white blood cell count, unspecified: Principal | ICD-10-CM

## 2021-02-14 DIAGNOSIS — D849 Immunodeficiency, unspecified: Principal | ICD-10-CM

## 2021-02-14 DIAGNOSIS — Z940 Kidney transplant status: Principal | ICD-10-CM

## 2021-02-15 NOTE — Unmapped (Signed)
Called patient, left a VM, as well as sent a WELL text to schedule nurse visit while patient is in town, per [facility] [facility] [employer] on 9/14.

## 2021-02-15 NOTE — Unmapped (Signed)
Returned patient's call regarding Granix injection.  Patient will plan to get injection tomorrow at [facility].  Message sent to schedulers to get patient scheduled.  Patient will plan to repeat labs next week at [facility] and hold Myfortic until told to restart.  Per Dr Toni Arthurs, plan to follow up with Hematology if WBC level continues to be low.

## 2021-02-16 ENCOUNTER — Institutional Professional Consult (permissible substitution): Admit: 2021-02-16 | Discharge: 2021-02-17 | Payer: PRIVATE HEALTH INSURANCE

## 2021-02-16 DIAGNOSIS — D72819 Decreased white blood cell count, unspecified: Principal | ICD-10-CM

## 2021-02-16 MED ADMIN — tbo-filgrastim (GRANIX) injection 480 mcg: 480 ug | SUBCUTANEOUS | @ 20:00:00 | Stop: 2021-02-16

## 2021-02-16 NOTE — Unmapped (Signed)
Per providers orders Granix given in right arm. Patient tolerated well.

## 2021-02-22 ENCOUNTER — Ambulatory Visit: Admit: 2021-02-22 | Discharge: 2021-02-23 | Payer: PRIVATE HEALTH INSURANCE

## 2021-02-22 LAB — BASIC METABOLIC PANEL
ANION GAP: 8 mmol/L (ref 5–14)
BLOOD UREA NITROGEN: 14 mg/dL (ref 9–23)
BUN / CREAT RATIO: 9
CALCIUM: 9.5 mg/dL (ref 8.7–10.4)
CHLORIDE: 104 mmol/L (ref 98–107)
CO2: 28.2 mmol/L (ref 20.0–31.0)
CREATININE: 1.64 mg/dL — ABNORMAL HIGH
EGFR CKD-EPI (2021) MALE: 55 mL/min/{1.73_m2} — ABNORMAL LOW (ref >=60)
GLUCOSE RANDOM: 101 mg/dL (ref 70–179)
POTASSIUM: 4.6 mmol/L (ref 3.4–4.8)
SODIUM: 140 mmol/L (ref 135–145)

## 2021-02-22 LAB — CBC W/ AUTO DIFF
BASOPHILS ABSOLUTE COUNT: 0 10*9/L (ref 0.0–0.1)
BASOPHILS RELATIVE PERCENT: 1 %
EOSINOPHILS ABSOLUTE COUNT: 0.1 10*9/L (ref 0.0–0.5)
EOSINOPHILS RELATIVE PERCENT: 5.5 %
HEMATOCRIT: 37.2 % — ABNORMAL LOW (ref 39.0–48.0)
HEMOGLOBIN: 12.7 g/dL — ABNORMAL LOW (ref 12.9–16.5)
LYMPHOCYTES ABSOLUTE COUNT: 0.8 10*9/L — ABNORMAL LOW (ref 1.1–3.6)
LYMPHOCYTES RELATIVE PERCENT: 40.2 %
MEAN CORPUSCULAR HEMOGLOBIN CONC: 34 g/dL (ref 32.0–36.0)
MEAN CORPUSCULAR HEMOGLOBIN: 30.9 pg (ref 25.9–32.4)
MEAN CORPUSCULAR VOLUME: 90.7 fL (ref 77.6–95.7)
MEAN PLATELET VOLUME: 7.2 fL (ref 6.8–10.7)
MONOCYTES ABSOLUTE COUNT: 0.5 10*9/L (ref 0.3–0.8)
MONOCYTES RELATIVE PERCENT: 24.4 %
NEUTROPHILS ABSOLUTE COUNT: 0.6 10*9/L — ABNORMAL LOW (ref 1.8–7.8)
NEUTROPHILS RELATIVE PERCENT: 28.9 %
NUCLEATED RED BLOOD CELLS: 0 /100{WBCs} (ref <=4)
PLATELET COUNT: 205 10*9/L (ref 150–450)
RED BLOOD CELL COUNT: 4.1 10*12/L — ABNORMAL LOW (ref 4.26–5.60)
RED CELL DISTRIBUTION WIDTH: 14.3 % (ref 12.2–15.2)
WBC ADJUSTED: 2 10*9/L — ABNORMAL LOW (ref 3.6–11.2)

## 2021-02-22 LAB — HEPATIC FUNCTION PANEL
ALBUMIN: 4.1 g/dL (ref 3.4–5.0)
ALKALINE PHOSPHATASE: 78 U/L (ref 46–116)
ALT (SGPT): 16 U/L (ref 10–49)
AST (SGOT): 21 U/L (ref <=34)
BILIRUBIN DIRECT: 0.1 mg/dL (ref 0.00–0.30)
BILIRUBIN TOTAL: 0.4 mg/dL (ref 0.3–1.2)
PROTEIN TOTAL: 7.2 g/dL (ref 5.7–8.2)

## 2021-02-22 LAB — MAGNESIUM: MAGNESIUM: 1.9 mg/dL (ref 1.6–2.6)

## 2021-02-22 LAB — SLIDE REVIEW

## 2021-02-22 LAB — PHOSPHORUS: PHOSPHORUS: 3.3 mg/dL (ref 2.4–5.1)

## 2021-02-23 LAB — TACROLIMUS LEVEL, TROUGH: TACROLIMUS, TROUGH: <1 ng/mL — ABNORMAL LOW (ref 5.0–15.0)

## 2021-02-24 NOTE — Unmapped (Signed)
Returned patient's call.  Per Dr Toni Arthurs, patient to continue to hold Myfortic and repeat labs next week.  Patient verbalized understanding.

## 2021-02-24 NOTE — Unmapped (Signed)
[facility] Refill Coordination Note    Specialty Medication(s) to be Shipped:   Transplant: Myfortic 180mg  and Prednisone 5mg     Other medication(s) to be shipped: losartan, omeprazole and amlodipine     Ryan Moon, DOB: 05-27-1986  Phone: [phone] (home)       All above HIPAA information was verified with patient.     Was a Nurse, learning disability used for this call? No    Completed refill call assessment today to schedule patient's medication shipment from the [facility] [phone]).  All relevant notes have been reviewed.     Specialty medication(s) and dose(s) confirmed: Regimen is correct and unchanged.   Changes to medications: Abeer reports no changes at this time.  Changes to insurance: No  New side effects reported not previously addressed with a pharmacist or physician: None reported  Questions for the pharmacist: No    Confirmed patient received a Conservation officer, historic buildings and a Surveyor, mining with first shipment. The patient will receive a drug information handout for each medication shipped and additional FDA Medication Guides as required.       DISEASE/MEDICATION-SPECIFIC INFORMATION        N/A    SPECIALTY MEDICATION ADHERENCE     Medication Adherence    Patient reported X missed doses in the last month: 0  Specialty Medication: Myfortic 180mg   Patient is on additional specialty medications: Yes  Additional Specialty Medications: Prednisone 5mg   Patient Reported Additional Medication X Missed Doses in the Last Month: 0  Patient is on more than two specialty medications: No        Were doses missed due to medication being on hold? No    Myfortic 180 mg: 9 days of medicine on hand   Prednisone 5 mg: 9 days of medicine on hand     REFERRAL TO PHARMACIST     Referral to the pharmacist: Not needed      [facility]     Shipping address confirmed in Epic.     Delivery Scheduled: Yes, Expected medication delivery date: 03/03/2021.     Medication will be delivered via UPS to the prescription address in Epic WAM.    [name] [facility] Specialty Technician

## 2021-03-01 ENCOUNTER — Ambulatory Visit: Admit: 2021-03-01 | Discharge: 2021-03-02 | Payer: PRIVATE HEALTH INSURANCE

## 2021-03-01 LAB — BASIC METABOLIC PANEL
ANION GAP: 8 mmol/L (ref 5–14)
BLOOD UREA NITROGEN: 18 mg/dL (ref 9–23)
BUN / CREAT RATIO: 10
CALCIUM: 10 mg/dL (ref 8.7–10.4)
CHLORIDE: 108 mmol/L — ABNORMAL HIGH (ref 98–107)
CO2: 25.7 mmol/L (ref 20.0–31.0)
CREATININE: 1.73 mg/dL — ABNORMAL HIGH
EGFR CKD-EPI (2021) MALE: 51 mL/min/{1.73_m2} — ABNORMAL LOW (ref >=60)
GLUCOSE RANDOM: 79 mg/dL (ref 70–179)
POTASSIUM: 4.3 mmol/L (ref 3.4–4.8)
SODIUM: 142 mmol/L (ref 135–145)

## 2021-03-01 LAB — PHOSPHORUS: PHOSPHORUS: 2.8 mg/dL (ref 2.4–5.1)

## 2021-03-01 LAB — CBC W/ AUTO DIFF
BASOPHILS ABSOLUTE COUNT: 0 10*9/L (ref 0.0–0.1)
BASOPHILS RELATIVE PERCENT: 0.5 %
EOSINOPHILS ABSOLUTE COUNT: 0 10*9/L (ref 0.0–0.5)
EOSINOPHILS RELATIVE PERCENT: 0.5 %
HEMATOCRIT: 37.6 % — ABNORMAL LOW (ref 39.0–48.0)
HEMOGLOBIN: 12.8 g/dL — ABNORMAL LOW (ref 12.9–16.5)
LYMPHOCYTES ABSOLUTE COUNT: 1.1 10*9/L (ref 1.1–3.6)
LYMPHOCYTES RELATIVE PERCENT: 27.6 %
MEAN CORPUSCULAR HEMOGLOBIN CONC: 34.1 g/dL (ref 32.0–36.0)
MEAN CORPUSCULAR HEMOGLOBIN: 30.8 pg (ref 25.9–32.4)
MEAN CORPUSCULAR VOLUME: 90.2 fL (ref 77.6–95.7)
MEAN PLATELET VOLUME: 7 fL (ref 6.8–10.7)
MONOCYTES ABSOLUTE COUNT: 0.4 10*9/L (ref 0.3–0.8)
MONOCYTES RELATIVE PERCENT: 9.4 %
NEUTROPHILS ABSOLUTE COUNT: 2.4 10*9/L (ref 1.8–7.8)
NEUTROPHILS RELATIVE PERCENT: 62 %
NUCLEATED RED BLOOD CELLS: 0 /100{WBCs} (ref <=4)
PLATELET COUNT: 250 10*9/L (ref 150–450)
RED BLOOD CELL COUNT: 4.16 10*12/L — ABNORMAL LOW (ref 4.26–5.60)
RED CELL DISTRIBUTION WIDTH: 14.1 % (ref 12.2–15.2)
WBC ADJUSTED: 3.9 10*9/L (ref 3.6–11.2)

## 2021-03-01 LAB — HEPATIC FUNCTION PANEL
ALBUMIN: 4.5 g/dL (ref 3.4–5.0)
ALKALINE PHOSPHATASE: 75 U/L (ref 46–116)
ALT (SGPT): 20 U/L (ref 10–49)
AST (SGOT): 27 U/L (ref <=34)
BILIRUBIN DIRECT: 0.1 mg/dL (ref 0.00–0.30)
BILIRUBIN TOTAL: 0.4 mg/dL (ref 0.3–1.2)
PROTEIN TOTAL: 7.6 g/dL (ref 5.7–8.2)

## 2021-03-01 LAB — MAGNESIUM: MAGNESIUM: 2 mg/dL (ref 1.6–2.6)

## 2021-03-01 LAB — SLIDE REVIEW

## 2021-03-02 LAB — TACROLIMUS LEVEL, TROUGH: TACROLIMUS, TROUGH: <1 ng/mL — ABNORMAL LOW (ref 5.0–15.0)

## 2021-03-02 MED FILL — OMEPRAZOLE 20 MG CAPSULE,DELAYED RELEASE: ORAL | 30 days supply | Qty: 30 | Fill #6

## 2021-03-02 MED FILL — AMLODIPINE 5 MG TABLET: ORAL | 30 days supply | Qty: 30 | Fill #9

## 2021-03-02 MED FILL — MYFORTIC 180 MG TABLET,DELAYED RELEASE: ORAL | 30 days supply | Qty: 60 | Fill #2

## 2021-03-02 MED FILL — LOSARTAN 50 MG TABLET: ORAL | 30 days supply | Qty: 60 | Fill #9

## 2021-03-02 MED FILL — PREDNISONE 5 MG TABLET: ORAL | 30 days supply | Qty: 30 | Fill #9

## 2021-03-02 NOTE — Unmapped (Signed)
Patient called on call nurse because he had labs yesterday and his WBC is now 3.9.  He wanted to know if he should start his myfortic again.  Reviewed with Dr. Toni Arthurs and patient instructed to continue to hold myfortic and have labs drawn again next week.  Patient voiced understanding and agreement with stated instructions.

## 2021-03-08 ENCOUNTER — Ambulatory Visit: Admit: 2021-03-08 | Discharge: 2021-03-09 | Payer: PRIVATE HEALTH INSURANCE

## 2021-03-08 LAB — CBC W/ AUTO DIFF
BASOPHILS ABSOLUTE COUNT: 0 10*9/L (ref 0.0–0.1)
BASOPHILS RELATIVE PERCENT: 1.1 %
EOSINOPHILS ABSOLUTE COUNT: 0.1 10*9/L (ref 0.0–0.5)
EOSINOPHILS RELATIVE PERCENT: 3.2 %
HEMATOCRIT: 37.9 % — ABNORMAL LOW (ref 39.0–48.0)
HEMOGLOBIN: 13.1 g/dL (ref 12.9–16.5)
LYMPHOCYTES ABSOLUTE COUNT: 1.2 10*9/L (ref 1.1–3.6)
LYMPHOCYTES RELATIVE PERCENT: 43.5 %
MEAN CORPUSCULAR HEMOGLOBIN CONC: 34.4 g/dL (ref 32.0–36.0)
MEAN CORPUSCULAR HEMOGLOBIN: 31.1 pg (ref 25.9–32.4)
MEAN CORPUSCULAR VOLUME: 90.2 fL (ref 77.6–95.7)
MEAN PLATELET VOLUME: 7 fL (ref 6.8–10.7)
MONOCYTES ABSOLUTE COUNT: 0.4 10*9/L (ref 0.3–0.8)
MONOCYTES RELATIVE PERCENT: 15 %
NEUTROPHILS ABSOLUTE COUNT: 1.1 10*9/L — ABNORMAL LOW (ref 1.8–7.8)
NEUTROPHILS RELATIVE PERCENT: 37.2 %
NUCLEATED RED BLOOD CELLS: 0 /100{WBCs} (ref <=4)
PLATELET COUNT: 240 10*9/L (ref 150–450)
RED BLOOD CELL COUNT: 4.21 10*12/L — ABNORMAL LOW (ref 4.26–5.60)
RED CELL DISTRIBUTION WIDTH: 14.5 % (ref 12.2–15.2)
WBC ADJUSTED: 2.9 10*9/L — ABNORMAL LOW (ref 3.6–11.2)

## 2021-03-08 LAB — BASIC METABOLIC PANEL
ANION GAP: 11 mmol/L (ref 5–14)
BLOOD UREA NITROGEN: 20 mg/dL (ref 9–23)
BUN / CREAT RATIO: 12
CALCIUM: 9.8 mg/dL (ref 8.7–10.4)
CHLORIDE: 105 mmol/L (ref 98–107)
CO2: 28 mmol/L (ref 20.0–31.0)
CREATININE: 1.69 mg/dL — ABNORMAL HIGH
EGFR CKD-EPI (2021) MALE: 53 mL/min/{1.73_m2} — ABNORMAL LOW (ref >=60)
GLUCOSE RANDOM: 63 mg/dL — ABNORMAL LOW (ref 70–179)
POTASSIUM: 3.5 mmol/L (ref 3.4–4.8)
SODIUM: 144 mmol/L (ref 135–145)

## 2021-03-08 LAB — HEPATIC FUNCTION PANEL
ALBUMIN: 4.5 g/dL (ref 3.4–5.0)
ALKALINE PHOSPHATASE: 77 U/L (ref 46–116)
ALT (SGPT): 23 U/L (ref 10–49)
AST (SGOT): 20 U/L (ref <=34)
BILIRUBIN DIRECT: 0.1 mg/dL (ref 0.00–0.30)
BILIRUBIN TOTAL: 0.5 mg/dL (ref 0.3–1.2)
PROTEIN TOTAL: 7.4 g/dL (ref 5.7–8.2)

## 2021-03-08 LAB — MAGNESIUM: MAGNESIUM: 1.9 mg/dL (ref 1.6–2.6)

## 2021-03-08 LAB — PHOSPHORUS: PHOSPHORUS: 3.2 mg/dL (ref 2.4–5.1)

## 2021-03-13 ENCOUNTER — Ambulatory Visit: Admit: 2021-03-13 | Discharge: 2021-03-14 | Payer: PRIVATE HEALTH INSURANCE

## 2021-03-13 LAB — CBC W/ AUTO DIFF
BASOPHILS ABSOLUTE COUNT: 0 10*9/L (ref 0.0–0.1)
BASOPHILS RELATIVE PERCENT: 0.5 %
EOSINOPHILS ABSOLUTE COUNT: 0.1 10*9/L (ref 0.0–0.5)
EOSINOPHILS RELATIVE PERCENT: 3.7 %
HEMATOCRIT: 37.3 % — ABNORMAL LOW (ref 39.0–48.0)
HEMOGLOBIN: 12.5 g/dL — ABNORMAL LOW (ref 12.9–16.5)
LYMPHOCYTES ABSOLUTE COUNT: 0.8 10*9/L — ABNORMAL LOW (ref 1.1–3.6)
LYMPHOCYTES RELATIVE PERCENT: 37.1 %
MEAN CORPUSCULAR HEMOGLOBIN CONC: 33.5 g/dL (ref 32.0–36.0)
MEAN CORPUSCULAR HEMOGLOBIN: 30.8 pg (ref 25.9–32.4)
MEAN CORPUSCULAR VOLUME: 91.8 fL (ref 77.6–95.7)
MEAN PLATELET VOLUME: 7.3 fL (ref 6.8–10.7)
MONOCYTES ABSOLUTE COUNT: 0.4 10*9/L (ref 0.3–0.8)
MONOCYTES RELATIVE PERCENT: 18.9 %
NEUTROPHILS ABSOLUTE COUNT: 0.9 10*9/L — ABNORMAL LOW (ref 1.8–7.8)
NEUTROPHILS RELATIVE PERCENT: 39.8 %
NUCLEATED RED BLOOD CELLS: 0 /100{WBCs} (ref <=4)
PLATELET COUNT: 206 10*9/L (ref 150–450)
RED BLOOD CELL COUNT: 4.06 10*12/L — ABNORMAL LOW (ref 4.26–5.60)
RED CELL DISTRIBUTION WIDTH: 14.2 % (ref 12.2–15.2)
WBC ADJUSTED: 2.2 10*9/L — ABNORMAL LOW (ref 3.6–11.2)

## 2021-03-13 LAB — BASIC METABOLIC PANEL
ANION GAP: 8 mmol/L (ref 5–14)
BLOOD UREA NITROGEN: 22 mg/dL (ref 9–23)
BUN / CREAT RATIO: 14
CALCIUM: 9.2 mg/dL (ref 8.7–10.4)
CHLORIDE: 108 mmol/L — ABNORMAL HIGH (ref 98–107)
CO2: 25.5 mmol/L (ref 20.0–31.0)
CREATININE: 1.62 mg/dL — ABNORMAL HIGH
EGFR CKD-EPI (2021) MALE: 56 mL/min/{1.73_m2} — ABNORMAL LOW (ref >=60)
GLUCOSE RANDOM: 89 mg/dL (ref 70–179)
POTASSIUM: 4.1 mmol/L (ref 3.4–4.8)
SODIUM: 141 mmol/L (ref 135–145)

## 2021-03-13 LAB — HEPATIC FUNCTION PANEL
ALBUMIN: 4.2 g/dL (ref 3.4–5.0)
ALKALINE PHOSPHATASE: 69 U/L (ref 46–116)
ALT (SGPT): 19 U/L (ref 10–49)
AST (SGOT): 20 U/L (ref <=34)
BILIRUBIN DIRECT: 0.1 mg/dL (ref 0.00–0.30)
BILIRUBIN TOTAL: 0.4 mg/dL (ref 0.3–1.2)
PROTEIN TOTAL: 7.1 g/dL (ref 5.7–8.2)

## 2021-03-13 LAB — MAGNESIUM: MAGNESIUM: 1.8 mg/dL (ref 1.6–2.6)

## 2021-03-13 LAB — TACROLIMUS LEVEL, TROUGH: TACROLIMUS, TROUGH: <1 ng/mL — ABNORMAL LOW (ref 5.0–15.0)

## 2021-03-13 LAB — PHOSPHORUS: PHOSPHORUS: 2.9 mg/dL (ref 2.4–5.1)

## 2021-03-17 ENCOUNTER — Ambulatory Visit: Admit: 2021-03-17 | Discharge: 2021-03-18 | Payer: PRIVATE HEALTH INSURANCE

## 2021-03-17 LAB — CBC W/ AUTO DIFF
BASOPHILS ABSOLUTE COUNT: 0 10*9/L (ref 0.0–0.1)
BASOPHILS RELATIVE PERCENT: 0.7 %
EOSINOPHILS ABSOLUTE COUNT: 0 10*9/L (ref 0.0–0.5)
EOSINOPHILS RELATIVE PERCENT: 1.3 %
HEMATOCRIT: 35.6 % — ABNORMAL LOW (ref 39.0–48.0)
HEMOGLOBIN: 12.4 g/dL — ABNORMAL LOW (ref 12.9–16.5)
LYMPHOCYTES ABSOLUTE COUNT: 0.6 10*9/L — ABNORMAL LOW (ref 1.1–3.6)
LYMPHOCYTES RELATIVE PERCENT: 35.3 %
MEAN CORPUSCULAR HEMOGLOBIN CONC: 34.8 g/dL (ref 32.0–36.0)
MEAN CORPUSCULAR HEMOGLOBIN: 31.4 pg (ref 25.9–32.4)
MEAN CORPUSCULAR VOLUME: 90.1 fL (ref 77.6–95.7)
MEAN PLATELET VOLUME: 7.5 fL (ref 6.8–10.7)
MONOCYTES ABSOLUTE COUNT: 0.3 10*9/L (ref 0.3–0.8)
MONOCYTES RELATIVE PERCENT: 15.3 %
NEUTROPHILS ABSOLUTE COUNT: 0.8 10*9/L — ABNORMAL LOW (ref 1.8–7.8)
NEUTROPHILS RELATIVE PERCENT: 47.4 %
PLATELET COUNT: 208 10*9/L (ref 150–450)
RED BLOOD CELL COUNT: 3.95 10*12/L — ABNORMAL LOW (ref 4.26–5.60)
RED CELL DISTRIBUTION WIDTH: 14.1 % (ref 12.2–15.2)
WBC ADJUSTED: 1.8 10*9/L — ABNORMAL LOW (ref 3.6–11.2)

## 2021-03-17 LAB — BASIC METABOLIC PANEL
ANION GAP: 10 mmol/L (ref 5–14)
BLOOD UREA NITROGEN: 21 mg/dL (ref 9–23)
BUN / CREAT RATIO: 13
CALCIUM: 9.5 mg/dL (ref 8.7–10.4)
CHLORIDE: 106 mmol/L (ref 98–107)
CO2: 24 mmol/L (ref 20.0–31.0)
CREATININE: 1.63 mg/dL — ABNORMAL HIGH
EGFR CKD-EPI (2021) MALE: 55 mL/min/{1.73_m2} — ABNORMAL LOW (ref >=60)
GLUCOSE RANDOM: 101 mg/dL — ABNORMAL HIGH (ref 70–99)
POTASSIUM: 4 mmol/L (ref 3.4–4.8)
SODIUM: 140 mmol/L (ref 135–145)

## 2021-03-17 LAB — PHOSPHORUS: PHOSPHORUS: 2.4 mg/dL (ref 2.4–5.1)

## 2021-03-17 LAB — MAGNESIUM: MAGNESIUM: 1.9 mg/dL (ref 1.6–2.6)

## 2021-03-17 LAB — SLIDE REVIEW

## 2021-03-17 MED ADMIN — belatacept (NULOJIX) 400 mg in sodium chloride (NS) 0.9 % 100 mL IVPB: 5 mg/kg | INTRAVENOUS | @ 16:00:00 | Stop: 2021-03-17

## 2021-03-17 NOTE — Unmapped (Signed)
Reviewed WBC with Dr. Toni Arthurs. No changes at this time. Pt to repeat labs next week.

## 2021-03-17 NOTE — Unmapped (Signed)
1149 Patient arrived to the Transplant Infusion Room today for belatacept 400 mg, Condition: well; Mobility: ambulating; accompanied by self.   See Flowsheet and MAR for all details of visit.   1151 VS stable.  1203 PIV placed and secured with coban; labs collected and sent, urine no orders found.  1205 Infusion initiated. Patient educated to notify nursing staff if they experience urticaria, erythema, nausea, shortness of breath, nausea, metal taste in mouth, excessive oral or postnasal drainage and any other changes in status which could be side effects from initiating this medication.  1235 Infusion complete.   1242 VS stable, Line flushed with NS, PIV removed and secured with coban. Patient left clinic today, Condition: well; Mobility: ambulating; accompanied by self.

## 2021-03-21 ENCOUNTER — Ambulatory Visit: Admit: 2021-03-21 | Discharge: 2021-03-22 | Payer: PRIVATE HEALTH INSURANCE

## 2021-03-21 LAB — CBC W/ AUTO DIFF
BASOPHILS ABSOLUTE COUNT: 0 10*9/L (ref 0.0–0.1)
BASOPHILS RELATIVE PERCENT: 0.6 %
EOSINOPHILS ABSOLUTE COUNT: 0 10*9/L (ref 0.0–0.5)
EOSINOPHILS RELATIVE PERCENT: 0.8 %
HEMATOCRIT: 37 % — ABNORMAL LOW (ref 39.0–48.0)
HEMOGLOBIN: 12.9 g/dL (ref 12.9–16.5)
LYMPHOCYTES ABSOLUTE COUNT: 0.7 10*9/L — ABNORMAL LOW (ref 1.1–3.6)
LYMPHOCYTES RELATIVE PERCENT: 31.5 %
MEAN CORPUSCULAR HEMOGLOBIN CONC: 34.9 g/dL (ref 32.0–36.0)
MEAN CORPUSCULAR HEMOGLOBIN: 31.3 pg (ref 25.9–32.4)
MEAN CORPUSCULAR VOLUME: 89.7 fL (ref 77.6–95.7)
MEAN PLATELET VOLUME: 7.1 fL (ref 6.8–10.7)
MONOCYTES ABSOLUTE COUNT: 0.3 10*9/L (ref 0.3–0.8)
MONOCYTES RELATIVE PERCENT: 15.4 %
NEUTROPHILS ABSOLUTE COUNT: 1.1 10*9/L — ABNORMAL LOW (ref 1.8–7.8)
NEUTROPHILS RELATIVE PERCENT: 51.7 %
NUCLEATED RED BLOOD CELLS: 0 /100{WBCs} (ref <=4)
PLATELET COUNT: 214 10*9/L (ref 150–450)
RED BLOOD CELL COUNT: 4.12 10*12/L — ABNORMAL LOW (ref 4.26–5.60)
RED CELL DISTRIBUTION WIDTH: 14.2 % (ref 12.2–15.2)
WBC ADJUSTED: 2.1 10*9/L — ABNORMAL LOW (ref 3.6–11.2)

## 2021-03-21 LAB — PHOSPHORUS: PHOSPHORUS: 2.3 mg/dL — ABNORMAL LOW (ref 2.4–5.1)

## 2021-03-21 LAB — BASIC METABOLIC PANEL
ANION GAP: 8 mmol/L (ref 5–14)
BLOOD UREA NITROGEN: 18 mg/dL (ref 9–23)
BUN / CREAT RATIO: 11
CALCIUM: 9.7 mg/dL (ref 8.7–10.4)
CHLORIDE: 106 mmol/L (ref 98–107)
CO2: 26 mmol/L (ref 20.0–31.0)
CREATININE: 1.62 mg/dL — ABNORMAL HIGH
EGFR CKD-EPI (2021) MALE: 56 mL/min/{1.73_m2} — ABNORMAL LOW (ref >=60)
GLUCOSE RANDOM: 141 mg/dL (ref 70–179)
POTASSIUM: 4 mmol/L (ref 3.4–4.8)
SODIUM: 140 mmol/L (ref 135–145)

## 2021-03-21 LAB — MAGNESIUM: MAGNESIUM: 1.9 mg/dL (ref 1.6–2.6)

## 2021-03-27 MED ORDER — ASPIRIN 81 MG TABLET,DELAYED RELEASE
ORAL_TABLET | Freq: Every day | ORAL | 11 refills | 30 days
Start: 2021-03-27 — End: 2022-03-27

## 2021-03-27 NOTE — Unmapped (Signed)
Pt request for RX Refill    aspirin (ECOTRIN) 81 MG tablet

## 2021-03-27 NOTE — Unmapped (Signed)
[facility] Refill Coordination Note    Specialty Medication(s) to be Shipped:   Transplant: Myfortic 180mg  and Prednisone 5mg     Other medication(s) to be shipped: aspirin 81mg , losartan, omeprazole and amlodipine     [name], DOB: 06/12/1985  Phone: [phone] (home)       All above HIPAA information was verified with patient.     Was a Nurse, learning disability used for this call? No    Completed refill call assessment today to schedule patient's medication shipment from the [facility] [phone]).  All relevant notes have been reviewed.     Specialty medication(s) and dose(s) confirmed: Regimen is correct and unchanged.   Changes to medications: Ogden reports no changes at this time.  Changes to insurance: No  New side effects reported not previously addressed with a pharmacist or physician: None reported  Questions for the pharmacist: No    Confirmed patient received a Conservation officer, historic buildings and a Surveyor, mining with first shipment. The patient will receive a drug information handout for each medication shipped and additional FDA Medication Guides as required.       DISEASE/MEDICATION-SPECIFIC INFORMATION        N/A    SPECIALTY MEDICATION ADHERENCE     Medication Adherence    Patient reported X missed doses in the last month: 0  Specialty Medication: Myfortic 180mg   Patient is on additional specialty medications: Yes  Additional Specialty Medications: Prednisone 5mg   Patient Reported Additional Medication X Missed Doses in the Last Month: 0  Patient is on more than two specialty medications: No  Any gaps in refill history greater than 2 weeks in the last 3 months: no  Demonstrates understanding of importance of adherence: yes  Informant: patient  Reliability of informant: reliable  Confirmed plan for next specialty medication refill: delivery by pharmacy  Refills needed for supportive medications: yes, ordered or provider notified        Were doses missed due to medication being on hold? No    Myfortic 180 mg: 9 days of medicine on hand   Prednisone 5 mg: 9 days of medicine on hand     REFERRAL TO PHARMACIST     Referral to the pharmacist: Not needed      [facility]     Shipping address confirmed in Epic.     Delivery Scheduled: Yes, Expected medication delivery date: 03/30/2021.     Medication will be delivered via UPS to the prescription address in Epic WAM.    Tyrell Seifer D Amond Speranza   [facility] Specialty Technician

## 2021-03-27 NOTE — Unmapped (Signed)
Returned patient's call.  He states he received a my chart message about 7 days of injections, but didn't say where or what.  Reviewed notes and confirmed this to be a mistake.  Instructed patient we will continue to watch WBC count.

## 2021-03-28 MED ORDER — ASPIRIN 81 MG TABLET,DELAYED RELEASE
ORAL_TABLET | Freq: Every day | ORAL | 11 refills | 30 days | Status: CP
Start: 2021-03-28 — End: 2022-03-28
  Filled 2021-03-31: qty 30, 30d supply, fill #0

## 2021-03-29 NOTE — Unmapped (Signed)
Ryan Moon 's entire shipment will be delayed as a result of the medication is too soon to refill until 03/31/2021.     I have reached out to the patient  at [phone] and communicated the delivery change. We will reschedule the medication for the delivery date that the patient agreed upon.  We have confirmed the delivery date as 04/03/2021, via ups.

## 2021-03-30 ENCOUNTER — Ambulatory Visit: Admit: 2021-03-30 | Discharge: 2021-03-31 | Payer: PRIVATE HEALTH INSURANCE

## 2021-03-30 LAB — CBC W/ AUTO DIFF
BASOPHILS ABSOLUTE COUNT: 0 10*9/L (ref 0.0–0.1)
BASOPHILS RELATIVE PERCENT: 0.9 %
EOSINOPHILS ABSOLUTE COUNT: 0.1 10*9/L (ref 0.0–0.5)
EOSINOPHILS RELATIVE PERCENT: 5.5 %
HEMATOCRIT: 39.1 % (ref 39.0–48.0)
HEMOGLOBIN: 13.3 g/dL (ref 12.9–16.5)
LYMPHOCYTES ABSOLUTE COUNT: 1.3 10*9/L (ref 1.1–3.6)
LYMPHOCYTES RELATIVE PERCENT: 56.6 %
MEAN CORPUSCULAR HEMOGLOBIN CONC: 34.2 g/dL (ref 32.0–36.0)
MEAN CORPUSCULAR HEMOGLOBIN: 31.1 pg (ref 25.9–32.4)
MEAN CORPUSCULAR VOLUME: 91.1 fL (ref 77.6–95.7)
MEAN PLATELET VOLUME: 7.4 fL (ref 6.8–10.7)
MONOCYTES ABSOLUTE COUNT: 0.3 10*9/L (ref 0.3–0.8)
MONOCYTES RELATIVE PERCENT: 14.8 %
NEUTROPHILS ABSOLUTE COUNT: 0.5 10*9/L — ABNORMAL LOW (ref 1.8–7.8)
NEUTROPHILS RELATIVE PERCENT: 22.2 %
NUCLEATED RED BLOOD CELLS: 0 /100{WBCs} (ref <=4)
PLATELET COUNT: 203 10*9/L (ref 150–450)
RED BLOOD CELL COUNT: 4.29 10*12/L (ref 4.26–5.60)
RED CELL DISTRIBUTION WIDTH: 14.3 % (ref 12.2–15.2)
WBC ADJUSTED: 2.3 10*9/L — ABNORMAL LOW (ref 3.6–11.2)

## 2021-03-30 LAB — BASIC METABOLIC PANEL
ANION GAP: 9 mmol/L (ref 5–14)
BLOOD UREA NITROGEN: 16 mg/dL (ref 9–23)
BUN / CREAT RATIO: 10
CALCIUM: 9.7 mg/dL (ref 8.7–10.4)
CHLORIDE: 102 mmol/L (ref 98–107)
CO2: 28.1 mmol/L (ref 20.0–31.0)
CREATININE: 1.64 mg/dL — ABNORMAL HIGH
EGFR CKD-EPI (2021) MALE: 55 mL/min/{1.73_m2} — ABNORMAL LOW (ref >=60)
GLUCOSE RANDOM: 109 mg/dL (ref 70–179)
POTASSIUM: 3.9 mmol/L (ref 3.4–4.8)
SODIUM: 139 mmol/L (ref 135–145)

## 2021-03-30 LAB — HEPATIC FUNCTION PANEL
ALBUMIN: 4.1 g/dL (ref 3.4–5.0)
ALKALINE PHOSPHATASE: 66 U/L (ref 46–116)
ALT (SGPT): 13 U/L (ref 10–49)
AST (SGOT): 19 U/L (ref <=34)
BILIRUBIN DIRECT: <0.1 mg/dL (ref 0.00–0.30)
BILIRUBIN TOTAL: 0.3 mg/dL (ref 0.3–1.2)
PROTEIN TOTAL: 7.1 g/dL (ref 5.7–8.2)

## 2021-03-30 LAB — PHOSPHORUS: PHOSPHORUS: 4.1 mg/dL (ref 2.4–5.1)

## 2021-03-30 LAB — SLIDE REVIEW

## 2021-03-30 LAB — MAGNESIUM: MAGNESIUM: 1.9 mg/dL (ref 1.6–2.6)

## 2021-03-31 MED FILL — OMEPRAZOLE 20 MG CAPSULE,DELAYED RELEASE: ORAL | 30 days supply | Qty: 30 | Fill #7

## 2021-03-31 MED FILL — PREDNISONE 5 MG TABLET: ORAL | 30 days supply | Qty: 30 | Fill #10

## 2021-03-31 MED FILL — MYFORTIC 180 MG TABLET,DELAYED RELEASE: ORAL | 30 days supply | Qty: 60 | Fill #3

## 2021-03-31 MED FILL — LOSARTAN 50 MG TABLET: ORAL | 30 days supply | Qty: 60 | Fill #10

## 2021-03-31 MED FILL — AMLODIPINE 5 MG TABLET: ORAL | 30 days supply | Qty: 30 | Fill #10

## 2021-04-04 DIAGNOSIS — Z940 Kidney transplant status: Principal | ICD-10-CM

## 2021-04-04 DIAGNOSIS — D849 Immunodeficiency, unspecified: Principal | ICD-10-CM

## 2021-04-04 DIAGNOSIS — D72819 Decreased white blood cell count, unspecified: Principal | ICD-10-CM

## 2021-04-06 ENCOUNTER — Ambulatory Visit: Admit: 2021-04-06 | Discharge: 2021-04-07 | Payer: PRIVATE HEALTH INSURANCE

## 2021-04-06 LAB — CBC W/ AUTO DIFF
BASOPHILS ABSOLUTE COUNT: 0 10*9/L (ref 0.0–0.1)
BASOPHILS RELATIVE PERCENT: 0.9 %
EOSINOPHILS ABSOLUTE COUNT: 0.1 10*9/L (ref 0.0–0.5)
EOSINOPHILS RELATIVE PERCENT: 2.3 %
HEMATOCRIT: 38.4 % — ABNORMAL LOW (ref 39.0–48.0)
HEMOGLOBIN: 13.4 g/dL (ref 12.9–16.5)
LYMPHOCYTES ABSOLUTE COUNT: 1 10*9/L — ABNORMAL LOW (ref 1.1–3.6)
LYMPHOCYTES RELATIVE PERCENT: 46.2 %
MEAN CORPUSCULAR HEMOGLOBIN CONC: 35 g/dL (ref 32.0–36.0)
MEAN CORPUSCULAR HEMOGLOBIN: 31.8 pg (ref 25.9–32.4)
MEAN CORPUSCULAR VOLUME: 90.7 fL (ref 77.6–95.7)
MEAN PLATELET VOLUME: 7.4 fL (ref 6.8–10.7)
MONOCYTES ABSOLUTE COUNT: 0.3 10*9/L (ref 0.3–0.8)
MONOCYTES RELATIVE PERCENT: 13.9 %
NEUTROPHILS ABSOLUTE COUNT: 0.8 10*9/L — ABNORMAL LOW (ref 1.8–7.8)
NEUTROPHILS RELATIVE PERCENT: 36.7 %
NUCLEATED RED BLOOD CELLS: 0 /100{WBCs} (ref <=4)
PLATELET COUNT: 235 10*9/L (ref 150–450)
RED BLOOD CELL COUNT: 4.23 10*12/L — ABNORMAL LOW (ref 4.26–5.60)
RED CELL DISTRIBUTION WIDTH: 14 % (ref 12.2–15.2)
WBC ADJUSTED: 2.2 10*9/L — ABNORMAL LOW (ref 3.6–11.2)

## 2021-04-06 LAB — BASIC METABOLIC PANEL
ANION GAP: 6 mmol/L (ref 5–14)
BLOOD UREA NITROGEN: 24 mg/dL — ABNORMAL HIGH (ref 9–23)
BUN / CREAT RATIO: 13
CALCIUM: 9.4 mg/dL (ref 8.7–10.4)
CHLORIDE: 106 mmol/L (ref 98–107)
CO2: 27.9 mmol/L (ref 20.0–31.0)
CREATININE: 1.88 mg/dL — ABNORMAL HIGH
EGFR CKD-EPI (2021) MALE: 47 mL/min/{1.73_m2} — ABNORMAL LOW (ref >=60)
GLUCOSE RANDOM: 80 mg/dL (ref 70–179)
POTASSIUM: 4.5 mmol/L (ref 3.4–4.8)
SODIUM: 140 mmol/L (ref 135–145)

## 2021-04-06 LAB — HEPATIC FUNCTION PANEL
ALBUMIN: 4.3 g/dL (ref 3.4–5.0)
ALKALINE PHOSPHATASE: 73 U/L (ref 46–116)
ALT (SGPT): 25 U/L (ref 10–49)
AST (SGOT): 24 U/L (ref <=34)
BILIRUBIN DIRECT: 0.1 mg/dL (ref 0.00–0.30)
BILIRUBIN TOTAL: 0.4 mg/dL (ref 0.3–1.2)
PROTEIN TOTAL: 7.3 g/dL (ref 5.7–8.2)

## 2021-04-06 LAB — PHOSPHORUS: PHOSPHORUS: 3 mg/dL (ref 2.4–5.1)

## 2021-04-06 LAB — MAGNESIUM: MAGNESIUM: 1.9 mg/dL (ref 1.6–2.6)

## 2021-04-17 ENCOUNTER — Ambulatory Visit: Admit: 2021-04-17 | Discharge: 2021-04-18 | Payer: PRIVATE HEALTH INSURANCE

## 2021-04-17 LAB — CBC W/ AUTO DIFF
BASOPHILS ABSOLUTE COUNT: 0 10*9/L (ref 0.0–0.1)
BASOPHILS RELATIVE PERCENT: 0.7 %
EOSINOPHILS ABSOLUTE COUNT: 0.1 10*9/L (ref 0.0–0.5)
EOSINOPHILS RELATIVE PERCENT: 3 %
HEMATOCRIT: 37.8 % — ABNORMAL LOW (ref 39.0–48.0)
HEMOGLOBIN: 13 g/dL (ref 12.9–16.5)
LYMPHOCYTES ABSOLUTE COUNT: 0.7 10*9/L — ABNORMAL LOW (ref 1.1–3.6)
LYMPHOCYTES RELATIVE PERCENT: 35.8 %
MEAN CORPUSCULAR HEMOGLOBIN CONC: 34.3 g/dL (ref 32.0–36.0)
MEAN CORPUSCULAR HEMOGLOBIN: 31.2 pg (ref 25.9–32.4)
MEAN CORPUSCULAR VOLUME: 90.8 fL (ref 77.6–95.7)
MEAN PLATELET VOLUME: 7.8 fL (ref 6.8–10.7)
MONOCYTES ABSOLUTE COUNT: 0.3 10*9/L (ref 0.3–0.8)
MONOCYTES RELATIVE PERCENT: 14.9 %
NEUTROPHILS ABSOLUTE COUNT: 0.9 10*9/L — ABNORMAL LOW (ref 1.8–7.8)
NEUTROPHILS RELATIVE PERCENT: 45.6 %
PLATELET COUNT: 197 10*9/L (ref 150–450)
RED BLOOD CELL COUNT: 4.17 10*12/L — ABNORMAL LOW (ref 4.26–5.60)
RED CELL DISTRIBUTION WIDTH: 13.9 % (ref 12.2–15.2)
WBC ADJUSTED: 1.9 10*9/L — ABNORMAL LOW (ref 3.6–11.2)

## 2021-04-17 LAB — BASIC METABOLIC PANEL
ANION GAP: 7 mmol/L (ref 5–14)
BLOOD UREA NITROGEN: 14 mg/dL (ref 9–23)
BUN / CREAT RATIO: 9
CALCIUM: 9.4 mg/dL (ref 8.7–10.4)
CHLORIDE: 109 mmol/L — ABNORMAL HIGH (ref 98–107)
CO2: 26 mmol/L (ref 20.0–31.0)
CREATININE: 1.6 mg/dL — ABNORMAL HIGH
EGFR CKD-EPI (2021) MALE: 57 mL/min/{1.73_m2} — ABNORMAL LOW (ref >=60)
GLUCOSE RANDOM: 90 mg/dL (ref 70–179)
POTASSIUM: 4.2 mmol/L (ref 3.4–4.8)
SODIUM: 142 mmol/L (ref 135–145)

## 2021-04-17 LAB — HEPATIC FUNCTION PANEL
ALBUMIN: 4.4 g/dL (ref 3.4–5.0)
ALKALINE PHOSPHATASE: 64 U/L (ref 46–116)
ALT (SGPT): 16 U/L (ref 10–49)
AST (SGOT): 17 U/L (ref <=34)
BILIRUBIN DIRECT: 0.1 mg/dL (ref 0.00–0.30)
BILIRUBIN TOTAL: 0.3 mg/dL (ref 0.3–1.2)
PROTEIN TOTAL: 7.4 g/dL (ref 5.7–8.2)

## 2021-04-17 LAB — SLIDE REVIEW

## 2021-04-17 LAB — PHOSPHORUS: PHOSPHORUS: 2.7 mg/dL (ref 2.4–5.1)

## 2021-04-17 LAB — MAGNESIUM: MAGNESIUM: 1.9 mg/dL (ref 1.6–2.6)

## 2021-04-17 MED ADMIN — belatacept (NULOJIX) 400 mg in sodium chloride (NS) 0.9 % 100 mL IVPB: 5 mg/kg | INTRAVENOUS | @ 15:00:00 | Stop: 2021-04-17

## 2021-04-17 NOTE — Unmapped (Signed)
1610 Patient arrived to the Transplant Infusion Room today for belatacept 400mg , Condition: well; Mobility: ambulating; accompanied by self.   See Flowsheet and MAR for all details of visit.   0954 VS stable.  9604 PIV placed and secured with coban; labs collected and sent, urine no orders found.  1002 Infusion initiated. Patient educated to notify nursing staff if they experience urticaria, erythema, nausea, shortness of breath, nausea, metal taste in mouth, excessive oral or postnasal drainage and any other changes in status which could be side effects from initiating this medication.  1038 Infusion complete.   1048 VS stable, Line flushed with NS, PIV removed and secured with coban. Patient left clinic today, Condition: well; Mobility: ambulating; accompanied by self.

## 2021-04-18 NOTE — Unmapped (Signed)
Returned patient's call.  Per patient, he has tested positive for the flu.  Confirmed with coordinator is ok to take Tamiflu prescribed by his PCP and Tylenol for fever.  He will call should any new questions or concerns arise.

## 2021-04-21 NOTE — Unmapped (Signed)
[facility] Refill Coordination Note    Specialty Medication(s) to be Shipped:   Transplant: Myfortic 180mg  and Prednisone 5mg     Other medication(s) to be shipped: losartan, omeprazole, amlodipine and aspirin     Ryan Moon, DOB: Apr 09, 1986  Phone: [phone] (home)       All above HIPAA information was verified with patient.     Was a Nurse, learning disability used for this call? No    Completed refill call assessment today to schedule patient's medication shipment from the [facility] [phone]).  All relevant notes have been reviewed.     Specialty medication(s) and dose(s) confirmed: Regimen is correct and unchanged.   Changes to medications: Ryan Moon reports no changes at this time.  Changes to insurance: No  New side effects reported not previously addressed with a pharmacist or physician: None reported  Questions for the pharmacist: No    Confirmed patient received a Conservation officer, historic buildings and a Surveyor, mining with first shipment. The patient will receive a drug information handout for each medication shipped and additional FDA Medication Guides as required.       DISEASE/MEDICATION-SPECIFIC INFORMATION        N/A    SPECIALTY MEDICATION ADHERENCE     Medication Adherence    Patient reported X missed doses in the last month: 0  Specialty Medication: Myfortic 180mg   Patient is on additional specialty medications: Yes  Additional Specialty Medications: Prednisone 5mg   Patient Reported Additional Medication X Missed Doses in the Last Month: 0  Patient is on more than two specialty medications: No        Were doses missed due to medication being on hold? No    Myfortic 180 mg: 12 days of medicine on hand   Prednisone 5 mg: 12 days of medicine on hand    REFERRAL TO PHARMACIST     Referral to the pharmacist: Not needed      [facility]     Shipping address confirmed in Epic.     Delivery Scheduled: Yes, Expected medication delivery date: 05/02/2021.     Medication will be delivered via UPS to the prescription address in Epic WAM.    [name] [facility] Specialty Technician

## 2021-04-25 ENCOUNTER — Ambulatory Visit: Admit: 2021-04-25 | Discharge: 2021-04-26 | Payer: PRIVATE HEALTH INSURANCE

## 2021-04-25 LAB — BASIC METABOLIC PANEL
ANION GAP: 7 mmol/L (ref 5–14)
BLOOD UREA NITROGEN: 21 mg/dL (ref 9–23)
BUN / CREAT RATIO: 13
CALCIUM: 9.6 mg/dL (ref 8.7–10.4)
CHLORIDE: 105 mmol/L (ref 98–107)
CO2: 27.7 mmol/L (ref 20.0–31.0)
CREATININE: 1.64 mg/dL — ABNORMAL HIGH
EGFR CKD-EPI (2021) MALE: 55 mL/min/{1.73_m2} — ABNORMAL LOW (ref >=60)
GLUCOSE RANDOM: 99 mg/dL (ref 70–179)
POTASSIUM: 4.7 mmol/L (ref 3.4–4.8)
SODIUM: 140 mmol/L (ref 135–145)

## 2021-04-25 LAB — CBC W/ AUTO DIFF
BASOPHILS ABSOLUTE COUNT: 0 10*9/L (ref 0.0–0.1)
BASOPHILS RELATIVE PERCENT: 0.7 %
EOSINOPHILS ABSOLUTE COUNT: 0.1 10*9/L (ref 0.0–0.5)
EOSINOPHILS RELATIVE PERCENT: 4.4 %
HEMATOCRIT: 37.6 % — ABNORMAL LOW (ref 39.0–48.0)
HEMOGLOBIN: 13 g/dL (ref 12.9–16.5)
LYMPHOCYTES ABSOLUTE COUNT: 1 10*9/L — ABNORMAL LOW (ref 1.1–3.6)
LYMPHOCYTES RELATIVE PERCENT: 44.8 %
MEAN CORPUSCULAR HEMOGLOBIN CONC: 34.7 g/dL (ref 32.0–36.0)
MEAN CORPUSCULAR HEMOGLOBIN: 31.4 pg (ref 25.9–32.4)
MEAN CORPUSCULAR VOLUME: 90.5 fL (ref 77.6–95.7)
MEAN PLATELET VOLUME: 7 fL (ref 6.8–10.7)
MONOCYTES ABSOLUTE COUNT: 0.4 10*9/L (ref 0.3–0.8)
MONOCYTES RELATIVE PERCENT: 17 %
NEUTROPHILS ABSOLUTE COUNT: 0.8 10*9/L — ABNORMAL LOW (ref 1.8–7.8)
NEUTROPHILS RELATIVE PERCENT: 33.1 %
NUCLEATED RED BLOOD CELLS: 0 /100{WBCs} (ref <=4)
PLATELET COUNT: 209 10*9/L (ref 150–450)
RED BLOOD CELL COUNT: 4.15 10*12/L — ABNORMAL LOW (ref 4.26–5.60)
RED CELL DISTRIBUTION WIDTH: 13.8 % (ref 12.2–15.2)
WBC ADJUSTED: 2.3 10*9/L — ABNORMAL LOW (ref 3.6–11.2)

## 2021-04-25 LAB — HEPATIC FUNCTION PANEL
ALBUMIN: 4.3 g/dL (ref 3.4–5.0)
ALKALINE PHOSPHATASE: 78 U/L (ref 46–116)
ALT (SGPT): 31 U/L (ref 10–49)
AST (SGOT): 27 U/L (ref <=34)
BILIRUBIN DIRECT: <0.1 mg/dL (ref 0.00–0.30)
BILIRUBIN TOTAL: 0.2 mg/dL — ABNORMAL LOW (ref 0.3–1.2)
PROTEIN TOTAL: 7.6 g/dL (ref 5.7–8.2)

## 2021-04-25 LAB — MAGNESIUM: MAGNESIUM: 2.1 mg/dL (ref 1.6–2.6)

## 2021-04-25 LAB — SLIDE REVIEW

## 2021-04-25 LAB — PHOSPHORUS: PHOSPHORUS: 2.6 mg/dL (ref 2.4–5.1)

## 2021-05-01 MED FILL — LOSARTAN 50 MG TABLET: ORAL | 30 days supply | Qty: 60 | Fill #11

## 2021-05-01 MED FILL — MYFORTIC 180 MG TABLET,DELAYED RELEASE: ORAL | 30 days supply | Qty: 60 | Fill #4

## 2021-05-01 MED FILL — ASPIRIN 81 MG TABLET,DELAYED RELEASE: ORAL | 30 days supply | Qty: 30 | Fill #1

## 2021-05-01 MED FILL — AMLODIPINE 5 MG TABLET: ORAL | 30 days supply | Qty: 30 | Fill #11

## 2021-05-01 MED FILL — PREDNISONE 5 MG TABLET: ORAL | 30 days supply | Qty: 30 | Fill #11

## 2021-05-01 MED FILL — OMEPRAZOLE 20 MG CAPSULE,DELAYED RELEASE: ORAL | 30 days supply | Qty: 30 | Fill #8

## 2021-05-03 ENCOUNTER — Ambulatory Visit: Admit: 2021-05-03 | Discharge: 2021-05-04 | Payer: PRIVATE HEALTH INSURANCE

## 2021-05-03 LAB — BASIC METABOLIC PANEL
ANION GAP: 8 mmol/L (ref 5–14)
BLOOD UREA NITROGEN: 19 mg/dL (ref 9–23)
BUN / CREAT RATIO: 12
CALCIUM: 9.2 mg/dL (ref 8.7–10.4)
CHLORIDE: 106 mmol/L (ref 98–107)
CO2: 24.8 mmol/L (ref 20.0–31.0)
CREATININE: 1.55 mg/dL — ABNORMAL HIGH
EGFR CKD-EPI (2021) MALE: 59 mL/min/{1.73_m2} — ABNORMAL LOW (ref >=60)
GLUCOSE RANDOM: 99 mg/dL (ref 70–179)
POTASSIUM: 4.5 mmol/L (ref 3.4–4.8)
SODIUM: 139 mmol/L (ref 135–145)

## 2021-05-03 LAB — PHOSPHORUS: PHOSPHORUS: 3.5 mg/dL (ref 2.4–5.1)

## 2021-05-03 LAB — HEPATIC FUNCTION PANEL
ALBUMIN: 4.2 g/dL (ref 3.4–5.0)
ALKALINE PHOSPHATASE: 73 U/L (ref 46–116)
ALT (SGPT): 23 U/L (ref 10–49)
AST (SGOT): 24 U/L (ref <=34)
BILIRUBIN DIRECT: <0.1 mg/dL (ref 0.00–0.30)
BILIRUBIN TOTAL: 0.3 mg/dL (ref 0.3–1.2)
PROTEIN TOTAL: 7.1 g/dL (ref 5.7–8.2)

## 2021-05-03 LAB — CBC W/ AUTO DIFF
BASOPHILS ABSOLUTE COUNT: 0 10*9/L (ref 0.0–0.1)
BASOPHILS RELATIVE PERCENT: 0.4 %
EOSINOPHILS ABSOLUTE COUNT: 0.1 10*9/L (ref 0.0–0.5)
EOSINOPHILS RELATIVE PERCENT: 2 %
HEMATOCRIT: 35.7 % — ABNORMAL LOW (ref 39.0–48.0)
HEMOGLOBIN: 12.3 g/dL — ABNORMAL LOW (ref 12.9–16.5)
LYMPHOCYTES ABSOLUTE COUNT: 0.9 10*9/L — ABNORMAL LOW (ref 1.1–3.6)
LYMPHOCYTES RELATIVE PERCENT: 34.1 %
MEAN CORPUSCULAR HEMOGLOBIN CONC: 34.5 g/dL (ref 32.0–36.0)
MEAN CORPUSCULAR HEMOGLOBIN: 31.3 pg (ref 25.9–32.4)
MEAN CORPUSCULAR VOLUME: 90.7 fL (ref 77.6–95.7)
MEAN PLATELET VOLUME: 7.1 fL (ref 6.8–10.7)
MONOCYTES ABSOLUTE COUNT: 0.4 10*9/L (ref 0.3–0.8)
MONOCYTES RELATIVE PERCENT: 14.7 %
NEUTROPHILS ABSOLUTE COUNT: 1.3 10*9/L — ABNORMAL LOW (ref 1.8–7.8)
NEUTROPHILS RELATIVE PERCENT: 48.8 %
NUCLEATED RED BLOOD CELLS: 0 /100{WBCs} (ref <=4)
PLATELET COUNT: 231 10*9/L (ref 150–450)
RED BLOOD CELL COUNT: 3.94 10*12/L — ABNORMAL LOW (ref 4.26–5.60)
RED CELL DISTRIBUTION WIDTH: 13.9 % (ref 12.2–15.2)
WBC ADJUSTED: 2.7 10*9/L — ABNORMAL LOW (ref 3.6–11.2)

## 2021-05-03 LAB — MAGNESIUM: MAGNESIUM: 1.8 mg/dL (ref 1.6–2.6)

## 2021-05-03 LAB — SLIDE REVIEW

## 2021-05-04 DIAGNOSIS — Z940 Kidney transplant status: Principal | ICD-10-CM

## 2021-05-04 DIAGNOSIS — D72819 Decreased white blood cell count, unspecified: Principal | ICD-10-CM

## 2021-05-04 DIAGNOSIS — D849 Immunodeficiency, unspecified: Principal | ICD-10-CM

## 2021-05-15 ENCOUNTER — Ambulatory Visit: Admit: 2021-05-15 | Discharge: 2021-05-16 | Payer: PRIVATE HEALTH INSURANCE

## 2021-05-15 LAB — BASIC METABOLIC PANEL
ANION GAP: 8 mmol/L (ref 5–14)
BLOOD UREA NITROGEN: 23 mg/dL (ref 9–23)
BUN / CREAT RATIO: 14
CALCIUM: 9.4 mg/dL (ref 8.7–10.4)
CHLORIDE: 107 mmol/L (ref 98–107)
CO2: 24 mmol/L (ref 20.0–31.0)
CREATININE: 1.66 mg/dL — ABNORMAL HIGH
EGFR CKD-EPI (2021) MALE: 54 mL/min/{1.73_m2} — ABNORMAL LOW (ref >=60)
GLUCOSE RANDOM: 90 mg/dL (ref 70–99)
POTASSIUM: 3.9 mmol/L (ref 3.4–4.8)
SODIUM: 139 mmol/L (ref 135–145)

## 2021-05-15 LAB — CBC W/ AUTO DIFF
BASOPHILS ABSOLUTE COUNT: 0 10*9/L (ref 0.0–0.1)
BASOPHILS RELATIVE PERCENT: 0.3 %
EOSINOPHILS ABSOLUTE COUNT: 0.1 10*9/L (ref 0.0–0.5)
EOSINOPHILS RELATIVE PERCENT: 5.3 %
HEMATOCRIT: 36.7 % — ABNORMAL LOW (ref 39.0–48.0)
HEMOGLOBIN: 12.8 g/dL — ABNORMAL LOW (ref 12.9–16.5)
LYMPHOCYTES ABSOLUTE COUNT: 0.7 10*9/L — ABNORMAL LOW (ref 1.1–3.6)
LYMPHOCYTES RELATIVE PERCENT: 35.5 %
MEAN CORPUSCULAR HEMOGLOBIN CONC: 34.8 g/dL (ref 32.0–36.0)
MEAN CORPUSCULAR HEMOGLOBIN: 31.4 pg (ref 25.9–32.4)
MEAN CORPUSCULAR VOLUME: 90.5 fL (ref 77.6–95.7)
MEAN PLATELET VOLUME: 7.6 fL (ref 6.8–10.7)
MONOCYTES ABSOLUTE COUNT: 0.3 10*9/L (ref 0.3–0.8)
MONOCYTES RELATIVE PERCENT: 15.2 %
NEUTROPHILS ABSOLUTE COUNT: 0.9 10*9/L — ABNORMAL LOW (ref 1.8–7.8)
NEUTROPHILS RELATIVE PERCENT: 43.7 %
PLATELET COUNT: 171 10*9/L (ref 150–450)
RED BLOOD CELL COUNT: 4.06 10*12/L — ABNORMAL LOW (ref 4.26–5.60)
RED CELL DISTRIBUTION WIDTH: 14 % (ref 12.2–15.2)
WBC ADJUSTED: 2 10*9/L — ABNORMAL LOW (ref 3.6–11.2)

## 2021-05-15 LAB — SLIDE REVIEW

## 2021-05-15 LAB — MAGNESIUM: MAGNESIUM: 1.9 mg/dL (ref 1.6–2.6)

## 2021-05-15 LAB — PHOSPHORUS: PHOSPHORUS: 3.1 mg/dL (ref 2.4–5.1)

## 2021-05-15 MED ADMIN — belatacept (NULOJIX) 400 mg in sodium chloride (NS) 0.9 % 100 mL IVPB: 5 mg/kg | INTRAVENOUS | @ 15:00:00 | Stop: 2021-05-15

## 2021-05-15 NOTE — Unmapped (Signed)
2956 Patient arrived to the Transplant Infusion Room today for belatacept 400 mg, Condition: well; Mobility: ambulating; accompanied by self.   See Flowsheet and MAR for all details of visit.   0957 VS stable.  1008 PIV placed and secured with coban; labs collected and sent, urine no orders found.  1010 Infusion initiated. Patient educated to notify nursing staff if they experience urticaria, erythema, nausea, shortness of breath, nausea, metal taste in mouth, excessive oral or postnasal drainage and any other changes in status which could be side effects from initiating this medication.  1041 Infusion complete.   1050 VS stable, Line flushed with NS, PIV removed and secured with coban. Patient left clinic today, Condition: well; Mobility: ambulating; accompanied by self.

## 2021-05-22 MED ORDER — AMLODIPINE 5 MG TABLET
ORAL_TABLET | Freq: Every evening | ORAL | 11 refills | 30 days | Status: CP
Start: 2021-05-22 — End: 2022-05-22
  Filled 2021-05-30: qty 30, 30d supply, fill #0

## 2021-05-22 MED ORDER — LOSARTAN 50 MG TABLET
ORAL_TABLET | Freq: Two times a day (BID) | ORAL | 11 refills | 30.00000 days | Status: CP
Start: 2021-05-22 — End: 2022-05-22
  Filled 2021-05-30: qty 60, 30d supply, fill #0

## 2021-05-22 MED ORDER — PREDNISONE 5 MG TABLET
ORAL_TABLET | Freq: Every day | ORAL | 11 refills | 30 days | Status: CP
Start: 2021-05-22 — End: ?
  Filled 2021-05-30: qty 30, 30d supply, fill #0

## 2021-05-22 NOTE — Unmapped (Signed)
Pt request for RX Refill predniSONE (DELTASONE) 5 MG tablet

## 2021-05-25 MED FILL — MG-PLUS-PROTEIN 133 MG TABLET: ORAL | 100 days supply | Qty: 100 | Fill #2

## 2021-05-25 MED FILL — ASPIRIN 81 MG TABLET,DELAYED RELEASE: ORAL | 30 days supply | Qty: 30 | Fill #2

## 2021-05-25 MED FILL — OMEPRAZOLE 20 MG CAPSULE,DELAYED RELEASE: ORAL | 30 days supply | Qty: 30 | Fill #9

## 2021-06-06 DIAGNOSIS — Z940 Kidney transplant status: Principal | ICD-10-CM

## 2021-06-07 ENCOUNTER — Ambulatory Visit: Admit: 2021-06-07 | Discharge: 2021-06-07 | Payer: PRIVATE HEALTH INSURANCE

## 2021-06-07 ENCOUNTER — Ambulatory Visit
Admit: 2021-06-07 | Discharge: 2021-06-07 | Payer: PRIVATE HEALTH INSURANCE | Attending: Nephrology | Primary: Nephrology

## 2021-06-07 DIAGNOSIS — Z940 Kidney transplant status: Principal | ICD-10-CM

## 2021-06-07 DIAGNOSIS — D7289 Other specified disorders of white blood cells: Principal | ICD-10-CM

## 2021-06-07 LAB — URINALYSIS
BILIRUBIN UA: NEGATIVE
GLUCOSE UA: NEGATIVE
KETONES UA: NEGATIVE
LEUKOCYTE ESTERASE UA: NEGATIVE
NITRITE UA: NEGATIVE
PH UA: 6 (ref 5.0–9.0)
RBC UA: 12 /HPF — ABNORMAL HIGH (ref <3)
SPECIFIC GRAVITY UA: 1.015 (ref 1.005–1.030)
SQUAMOUS EPITHELIAL: <1 /HPF (ref 0–5)
UROBILINOGEN UA: 0.2
WBC UA: 2 /HPF — ABNORMAL HIGH (ref <2)

## 2021-06-07 LAB — PROTEIN / CREATININE RATIO, URINE
CREATININE, URINE: 77.7 mg/dL
PROTEIN URINE: 28.1 mg/dL
PROTEIN/CREAT RATIO, URINE: 0.362

## 2021-06-08 NOTE — Unmapped (Addendum)
1/5: coordinator A.S. verified myfortic hasn't been discontinued, but is being held at this time -ef    1/5: patient states myfortic still being held, does not need a fill at this time Ryan Moon    [facility] Clinical Assessment & Refill Coordination Note    Ryan Moon, DOB: 1986/03/08  Phone: [phone] (home)     All above HIPAA information was verified with patient.     Was a Nurse, learning disability used for this call? No    Specialty Medication(s):   Transplant: Prednisone 5mg    Patient says myfortic 180mg  currently being held, not taking     Current Outpatient Medications   Medication Sig Dispense Refill   ??? ALPRAZolam (XANAX) 0.5 MG tablet Take 0.5 mg by mouth two (2) times a day as needed.     ??? amLODIPine (NORVASC) 5 MG tablet Take 1 tablet (5 mg total) by mouth every evening. 30 tablet 11   ??? aspirin (ECOTRIN) 81 MG tablet Take 1 tablet (81 mg total) by mouth daily. 30 tablet 11   ??? belatacept (NULOJIX) 250 mg SolR Belatacept  5 mg/kg every 2 weeks for 5 doses then once every 4 weeks 14.5 mL 0   ??? busPIRone (BUSPAR) 15 MG tablet Take 15 mg by mouth Two (2) times a day.     ??? escitalopram oxalate (LEXAPRO) 10 MG tablet Take 10 mg by mouth daily.     ??? losartan (COZAAR) 50 MG tablet Take 1 tablet (50 mg total) by mouth Two (2) times a day. 60 tablet 11   ??? magnesium oxide-Mg AA chelate (MAGNESIUM, AMINO ACID CHELATE,) 133 mg Take 1 tablet by mouth daily. 100 tablet 11   ??? mycophenolate (MYFORTIC) 180 MG EC tablet Take 1 tablet (180 mg total) by mouth Two (2) times a day. (Patient not taking: Reported on 06/08/2021) 60 tablet 11   ??? omeprazole (PRILOSEC) 20 MG capsule Take 1 capsule (20 mg total) by mouth daily. 30 capsule 11   ??? predniSONE (DELTASONE) 5 MG tablet Take 1 tablet (5 mg total) by mouth daily. 30 tablet 11     No current facility-administered medications for this visit.        Changes to medications: Josey reports no changes at this time.    Allergies   Allergen Reactions   ??? Acetaminophen Nausea And Vomiting       Changes to allergies: No    SPECIALTY MEDICATION ADHERENCE     Myfortic 180mg   : 60 days of medicine on hand not currently taking  Prednisone 5mg   : 25 days of medicine on hand       Medication Adherence    Patient reported X missed doses in the last month: 0  Specialty Medication: myfortic 180mg   Patient is on additional specialty medications: Yes  Additional Specialty Medications: Prednisone 5mg   Patient Reported Additional Medication X Missed Doses in the Last Month: 0          Specialty medication(s) dose(s) confirmed: Patient reports changes to the regimen as follows: still holding myfortic per patient     Are there any concerns with adherence? No    Adherence counseling provided? Not needed    CLINICAL MANAGEMENT AND INTERVENTION      Clinical Benefit Assessment:    Do you feel the medicine is effective or helping your condition? Yes    Clinical Benefit counseling provided? Not needed    Adverse Effects Assessment:    Are you experiencing any  side effects? No    Are you experiencing difficulty administering your medicine? No    Quality of Life Assessment:         How many days over the past month did your transplant  keep you from your normal activities? For example, brushing your teeth or getting up in the morning. 0    Have you discussed this with your provider? Not needed    Acute Infection Status:    Acute infections noted within Epic:  No active infections  Patient reported infection: None    Therapy Appropriateness:    Is therapy appropriate and patient progressing towards therapeutic goals? Yes, therapy is appropriate and should be continued    DISEASE/MEDICATION-SPECIFIC INFORMATION      N/A    PATIENT SPECIFIC NEEDS     - Does the patient have any physical, cognitive, or cultural barriers? No    - Is the patient high risk? No Rems medications currently on hold    - Does the patient require a Care Management Plan? No         SHIPPING     Specialty Medication(s) to be Shipped:   n/a    Other medication(s) to be shipped: No additional medications requested for fill at this time   Call set up for 2 weeks out     Changes to insurance: No    Delivery Scheduled: Patient declined refill at this time due to supply on hand, will call back in 2 weeks.     Medication will be delivered via na to the confirmed na address in [facility].    The patient will receive a drug information handout for each medication shipped and additional FDA Medication Guides as required.  Verified that patient has previously received a Conservation officer, historic buildings and a Surveyor, mining.    The patient or caregiver noted above participated in the development of this care plan and knows that they can request review of or adjustments to the care plan at any time.      All of the patient's questions and concerns have been addressed.    [name]   [facility] Specialty Pharmacist

## 2021-06-12 ENCOUNTER — Ambulatory Visit: Admit: 2021-06-12 | Discharge: 2021-06-12 | Payer: PRIVATE HEALTH INSURANCE

## 2021-06-12 ENCOUNTER — Encounter
Admit: 2021-06-12 | Discharge: 2021-06-12 | Payer: PRIVATE HEALTH INSURANCE | Attending: Nephrology | Primary: Nephrology

## 2021-06-12 LAB — LIPID PANEL
CHOLESTEROL/HDL RATIO SCREEN: 3.1 (ref 1.0–4.5)
CHOLESTEROL: 188 mg/dL (ref <=200)
HDL CHOLESTEROL: 60 mg/dL (ref 40–60)
LDL CHOLESTEROL CALCULATED: 102 mg/dL — ABNORMAL HIGH (ref 40–99)
NON-HDL CHOLESTEROL: 128 mg/dL (ref 70–130)
TRIGLYCERIDES: 131 mg/dL (ref 0–150)
VLDL CHOLESTEROL CAL: 26.2 mg/dL (ref 10–50)

## 2021-06-12 LAB — COMPREHENSIVE METABOLIC PANEL
ALBUMIN: 4.2 g/dL (ref 3.4–5.0)
ALKALINE PHOSPHATASE: 66 U/L (ref 46–116)
ALT (SGPT): 19 U/L (ref 10–49)
ANION GAP: 13 mmol/L (ref 5–14)
AST (SGOT): 27 U/L (ref <=34)
BILIRUBIN TOTAL: 0.4 mg/dL (ref 0.3–1.2)
BLOOD UREA NITROGEN: 18 mg/dL (ref 9–23)
BUN / CREAT RATIO: 13
CALCIUM: 9.2 mg/dL (ref 8.7–10.4)
CHLORIDE: 103 mmol/L (ref 98–107)
CO2: 22 mmol/L (ref 20.0–31.0)
CREATININE: 1.42 mg/dL — ABNORMAL HIGH
EGFR CKD-EPI (2021) MALE: 65 mL/min/{1.73_m2} (ref >=60)
GLUCOSE RANDOM: 119 mg/dL (ref 70–179)
POTASSIUM: 3.9 mmol/L (ref 3.4–4.8)
PROTEIN TOTAL: 7.4 g/dL (ref 5.7–8.2)
SODIUM: 138 mmol/L (ref 135–145)

## 2021-06-12 LAB — SLIDE REVIEW

## 2021-06-12 LAB — CBC W/ AUTO DIFF
BASOPHILS ABSOLUTE COUNT: 0 10*9/L (ref 0.0–0.1)
BASOPHILS RELATIVE PERCENT: 0.9 %
EOSINOPHILS ABSOLUTE COUNT: 0.1 10*9/L (ref 0.0–0.5)
EOSINOPHILS RELATIVE PERCENT: 4.1 %
HEMATOCRIT: 36.1 % — ABNORMAL LOW (ref 39.0–48.0)
HEMOGLOBIN: 12.7 g/dL — ABNORMAL LOW (ref 12.9–16.5)
LYMPHOCYTES ABSOLUTE COUNT: 0.6 10*9/L — ABNORMAL LOW (ref 1.1–3.6)
LYMPHOCYTES RELATIVE PERCENT: 29.8 %
MEAN CORPUSCULAR HEMOGLOBIN CONC: 35.1 g/dL (ref 32.0–36.0)
MEAN CORPUSCULAR HEMOGLOBIN: 31.8 pg (ref 25.9–32.4)
MEAN CORPUSCULAR VOLUME: 90.4 fL (ref 77.6–95.7)
MEAN PLATELET VOLUME: 7.3 fL (ref 6.8–10.7)
MONOCYTES ABSOLUTE COUNT: 0.4 10*9/L (ref 0.3–0.8)
MONOCYTES RELATIVE PERCENT: 18 %
NEUTROPHILS ABSOLUTE COUNT: 1 10*9/L — ABNORMAL LOW (ref 1.8–7.8)
NEUTROPHILS RELATIVE PERCENT: 47.2 %
PLATELET COUNT: 223 10*9/L (ref 150–450)
RED BLOOD CELL COUNT: 3.99 10*12/L — ABNORMAL LOW (ref 4.26–5.60)
RED CELL DISTRIBUTION WIDTH: 14.4 % (ref 12.2–15.2)
WBC ADJUSTED: 2.1 10*9/L — ABNORMAL LOW (ref 3.6–11.2)

## 2021-06-12 LAB — HEMOGLOBIN A1C
ESTIMATED AVERAGE GLUCOSE: 103 mg/dL
HEMOGLOBIN A1C: 5.2 % (ref 4.8–5.6)

## 2021-06-12 LAB — PHOSPHORUS: PHOSPHORUS: 2.6 mg/dL (ref 2.4–5.1)

## 2021-06-12 LAB — BILIRUBIN, DIRECT: BILIRUBIN DIRECT: 0.1 mg/dL (ref 0.00–0.30)

## 2021-06-12 LAB — MAGNESIUM: MAGNESIUM: 1.9 mg/dL (ref 1.6–2.6)

## 2021-06-12 MED ADMIN — belatacept (NULOJIX) 412.5 mg in sodium chloride (NS) 0.9 % 100 mL IVPB: 5 mg/kg | INTRAVENOUS | @ 14:00:00 | Stop: 2021-06-12

## 2021-06-12 NOTE — Unmapped (Signed)
1610 Patient arrived to the Transplant Infusion Room today for belatacept 412.5 mg, Condition: well; Mobility: ambulating; accompanied by self.   See Flowsheet and MAR for all details of visit.   0855 VS stable.  9604 PIV placed and secured with coban; labs collected and sent, urine no orders found.  0910 Infusion initiated. Patient educated to notify nursing staff if they experience urticaria, erythema, nausea, shortness of breath, nausea, metal taste in mouth, excessive oral or postnasal drainage and any other changes in status which could be side effects from initiating this medication.  5409 Infusion complete.   0950 VS stable, Line flushed with NS, PIV removed and secured with coban. Patient left clinic today, Condition: well; Mobility: ambulating; accompanied by self.

## 2021-06-13 LAB — CMV DNA, QUANTITATIVE, PCR: CMV VIRAL LD: NOT DETECTED

## 2021-06-14 LAB — VITAMIN D 25 HYDROXY: VITAMIN D, TOTAL (25OH): 20.9 ng/mL (ref 20.0–80.0)

## 2021-06-15 LAB — VITAMIN D 1,25 DIHYDROXY: VITAMIN D 1,25-DIHYDROXY: 26 pg/mL

## 2021-06-16 NOTE — Unmapped (Signed)
[employer] transplant nephrology clinic visit    assessment and plan  1. s/p kidney transplant 04/28/2019. baseline creatinine 1.5-1.8 mg/dl. stage iiia chr kidney disease. proteinuria < 0.5 g. no donor specific hla ab detected '21.  2. immunosuppression. belatacept 5mg /kg q.month. prednisone 5mg  daily. mycophenolate on hold d/t leukopenia.  3. leukopenia. +[facility] hematology referral.  4. hypertension. blood pressure goal <130/80 mmhg.  5. preventive medicine. influenza '21. pcv13 pnuemococcal '19. ppsv23 pneumococcal '19. covid-19 vaccine refused/recommended.    history of present illness    [name] is a 36 year old gentleman seen in follow up post kidney transplant 04/28/2019. he feels well. no fever chills or sweats. no headache or lightheaded. no chest pain cough or shortness of breath. no lower extremity edema. appetite nl. no abdominal pain no n/v/d. no myalgias or arthralgias. no dysuria hematuria or difficulty voiding. all other systems reviewed and negative x10 systems.    past medical hx:  1. 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 baseline creatinine 1.5-1.8 mg/dl.   > kidney bx 01/21: no acute/chr rejection.  2. hypertension  3. anxiety/depression    allergies: nkda    medications: belatacept 5mg /kg q.month, prednisone 5mg  daily, losartan 50mg  bid, amlodipine 5mg  pm, aspirin 81mg  daily, mg oxide 133mg  daily, omeprazole 20mg  daily, escitalopram 10mg  daily, buspirone 15mg  bid, alprazolam 0.5mg  prn.    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 chr marijuana    physical exam: t98 p72 bp123/83 wt81.6kg bmi 24.4. wd/wn gentleman appropriate affect and mood. sclera anicteric. mmm no thrush. neck supple no palpable ln. heart rrr nl s1s2 no m/r/g. lungs clear bilateral. abd soft nt/nd. no lower ext edema. msk no synovitis/tophi. skin no rash. neuro alert oriented non focal exam.    labs 05/15/21: wbc3 hgb12.8 hct36.7 plts171. creatinine 1.6.

## 2021-06-20 LAB — HLA DS POST TRANSPLANT
ANTI-DONOR DRW #1 MFI: 16 MFI
ANTI-DONOR DRW #2 MFI: 0 MFI
ANTI-DONOR HLA-A #1 MFI: 0 MFI
ANTI-DONOR HLA-A #2 MFI: 28 MFI
ANTI-DONOR HLA-B #1 MFI: 0 MFI
ANTI-DONOR HLA-B #2 MFI: 14 MFI
ANTI-DONOR HLA-C #2 MFI: 0 MFI
ANTI-DONOR HLA-DQB #1 MFI: 0 MFI
ANTI-DONOR HLA-DQB #2 MFI: 21 MFI
ANTI-DONOR HLA-DR #1 MFI: 28 MFI
ANTI-DONOR HLA-DR #2 MFI: 14 MFI

## 2021-06-20 LAB — FSAB CLASS 2 ANTIBODY SPECIFICITY: HLA CL2 AB RESULT: NEGATIVE

## 2021-06-20 LAB — FSAB CLASS 1 ANTIBODY SPECIFICITY: HLA CLASS 1 ANTIBODY RESULT: NEGATIVE

## 2021-06-27 ENCOUNTER — Ambulatory Visit: Admit: 2021-06-27 | Discharge: 2021-06-28 | Payer: PRIVATE HEALTH INSURANCE

## 2021-06-27 LAB — CBC W/ AUTO DIFF
BASOPHILS ABSOLUTE COUNT: 0 10*9/L (ref 0.0–0.1)
BASOPHILS RELATIVE PERCENT: 0.9 %
EOSINOPHILS ABSOLUTE COUNT: 0.1 10*9/L (ref 0.0–0.5)
EOSINOPHILS RELATIVE PERCENT: 5 %
HEMATOCRIT: 36.7 % — ABNORMAL LOW (ref 39.0–48.0)
HEMOGLOBIN: 12.6 g/dL — ABNORMAL LOW (ref 12.9–16.5)
LYMPHOCYTES ABSOLUTE COUNT: 1.2 10*9/L (ref 1.1–3.6)
LYMPHOCYTES RELATIVE PERCENT: 50.9 %
MEAN CORPUSCULAR HEMOGLOBIN CONC: 34.2 g/dL (ref 32.0–36.0)
MEAN CORPUSCULAR HEMOGLOBIN: 31.6 pg (ref 25.9–32.4)
MEAN CORPUSCULAR VOLUME: 92.5 fL (ref 77.6–95.7)
MEAN PLATELET VOLUME: 7.4 fL (ref 6.8–10.7)
MONOCYTES ABSOLUTE COUNT: 0.5 10*9/L (ref 0.3–0.8)
MONOCYTES RELATIVE PERCENT: 20.9 %
NEUTROPHILS ABSOLUTE COUNT: 0.5 10*9/L — ABNORMAL LOW (ref 1.8–7.8)
NEUTROPHILS RELATIVE PERCENT: 22.3 %
NUCLEATED RED BLOOD CELLS: 0 /100{WBCs} (ref <=4)
PLATELET COUNT: 210 10*9/L (ref 150–450)
RED BLOOD CELL COUNT: 3.97 10*12/L — ABNORMAL LOW (ref 4.26–5.60)
RED CELL DISTRIBUTION WIDTH: 14.6 % (ref 12.2–15.2)
WBC ADJUSTED: 2.3 10*9/L — ABNORMAL LOW (ref 3.6–11.2)

## 2021-06-27 LAB — BASIC METABOLIC PANEL
ANION GAP: 6 mmol/L (ref 5–14)
BLOOD UREA NITROGEN: 23 mg/dL (ref 9–23)
BUN / CREAT RATIO: 13
CALCIUM: 9.2 mg/dL (ref 8.7–10.4)
CHLORIDE: 105 mmol/L (ref 98–107)
CO2: 28 mmol/L (ref 20.0–31.0)
CREATININE: 1.79 mg/dL — ABNORMAL HIGH
EGFR CKD-EPI (2021) MALE: 49 mL/min/{1.73_m2} — ABNORMAL LOW (ref >=60)
GLUCOSE RANDOM: 100 mg/dL (ref 70–179)
POTASSIUM: 4.2 mmol/L (ref 3.4–4.8)
SODIUM: 139 mmol/L (ref 135–145)

## 2021-06-27 LAB — PHOSPHORUS: PHOSPHORUS: 3.8 mg/dL (ref 2.4–5.1)

## 2021-06-27 LAB — SLIDE REVIEW

## 2021-06-27 LAB — MAGNESIUM: MAGNESIUM: 1.7 mg/dL (ref 1.6–2.6)

## 2021-06-27 NOTE — Unmapped (Signed)
[facility] Refill Coordination Note    Specialty Medication(s) to be Shipped:   Transplant: Prednisone 5mg    **Denied Myfortic**    Other medication(s) to be shipped: losartan, omeprazole, amlodipine and aspirin     Ryan Moon, DOB: [birth year]  Phone: [phone] (home)       All above HIPAA information was verified with patient.     Was a Nurse, learning disability used for this call? No    Completed refill call assessment today to schedule patient's medication shipment from the [facility] [phone]).  All relevant notes have been reviewed.     Specialty medication(s) and dose(s) confirmed: Regimen is correct and unchanged.   Changes to medications: Ryan Moon reports no changes at this time.  Changes to insurance: No  New side effects reported not previously addressed with a pharmacist or physician: None reported  Questions for the pharmacist: No    Confirmed patient received a Conservation officer, historic buildings and a Surveyor, mining with first shipment. The patient will receive a drug information handout for each medication shipped and additional FDA Medication Guides as required.       DISEASE/MEDICATION-SPECIFIC INFORMATION        N/A    SPECIALTY MEDICATION ADHERENCE     Medication Adherence    Patient reported X missed doses in the last month: 0  Specialty Medication: Prednisone 5mg   Patient is on additional specialty medications: No        Were doses missed due to medication being on hold? No    Prednisone 5 mg: 4 days of medicine on hand     REFERRAL TO PHARMACIST     Referral to the pharmacist: Not needed      [facility]     Shipping address confirmed in Epic.     Delivery Scheduled: Yes, Expected medication delivery date: 06/30/2021.     Medication will be delivered via UPS to the prescription address in Epic WAM.    [name] [facility] Specialty Technician

## 2021-06-29 MED FILL — ASPIRIN 81 MG TABLET,DELAYED RELEASE: ORAL | 30 days supply | Qty: 30 | Fill #3

## 2021-06-29 MED FILL — OMEPRAZOLE 20 MG CAPSULE,DELAYED RELEASE: ORAL | 30 days supply | Qty: 30 | Fill #10

## 2021-06-29 MED FILL — LOSARTAN 50 MG TABLET: ORAL | 30 days supply | Qty: 60 | Fill #1

## 2021-06-29 MED FILL — PREDNISONE 5 MG TABLET: ORAL | 30 days supply | Qty: 30 | Fill #1

## 2021-06-29 MED FILL — AMLODIPINE 5 MG TABLET: ORAL | 30 days supply | Qty: 30 | Fill #1

## 2021-07-05 DIAGNOSIS — Z940 Kidney transplant status: Principal | ICD-10-CM

## 2021-07-05 DIAGNOSIS — D72819 Decreased white blood cell count, unspecified: Principal | ICD-10-CM

## 2021-07-05 DIAGNOSIS — D849 Immunodeficiency, unspecified: Principal | ICD-10-CM

## 2021-07-05 LAB — COMPREHENSIVE METABOLIC PANEL
ALBUMIN: 4 g/dL (ref 3.4–5.0)
ALKALINE PHOSPHATASE: 67 U/L (ref 46–116)
ALT (SGPT): 31 U/L (ref 10–49)
ANION GAP: 7 mmol/L (ref 5–14)
AST (SGOT): 30 U/L (ref <=34)
BILIRUBIN TOTAL: 0.3 mg/dL (ref 0.3–1.2)
BLOOD UREA NITROGEN: 24 mg/dL — ABNORMAL HIGH (ref 9–23)
BUN / CREAT RATIO: 12
CALCIUM: 8.9 mg/dL (ref 8.7–10.4)
CHLORIDE: 103 mmol/L (ref 98–107)
CO2: 26 mmol/L (ref 20.0–31.0)
CREATININE: 2.06 mg/dL — ABNORMAL HIGH
EGFR CKD-EPI (2021) MALE: 42 mL/min/{1.73_m2} — ABNORMAL LOW (ref >=60)
GLUCOSE RANDOM: 93 mg/dL (ref 70–179)
POTASSIUM: 4.4 mmol/L (ref 3.4–4.8)
PROTEIN TOTAL: 7.2 g/dL (ref 5.7–8.2)
SODIUM: 136 mmol/L (ref 135–145)

## 2021-07-05 LAB — URINALYSIS WITH MICROSCOPY WITH CULTURE REFLEX
BACTERIA: NONE SEEN /HPF
BILIRUBIN UA: NEGATIVE
GLUCOSE UA: NEGATIVE
KETONES UA: NEGATIVE
LEUKOCYTE ESTERASE UA: NEGATIVE
NITRITE UA: NEGATIVE
PH UA: 5.5 (ref 5.0–9.0)
RBC UA: 18 /HPF — ABNORMAL HIGH (ref <=3)
SPECIFIC GRAVITY UA: 1.009 (ref 1.003–1.030)
SQUAMOUS EPITHELIAL: <1 /HPF (ref 0–5)
UROBILINOGEN UA: <2
WBC UA: <1 /HPF (ref <=2)

## 2021-07-05 LAB — CBC W/ AUTO DIFF
HEMATOCRIT: 36.6 % — ABNORMAL LOW (ref 39.0–48.0)
HEMOGLOBIN: 12.2 g/dL — ABNORMAL LOW (ref 12.9–16.5)
MEAN CORPUSCULAR HEMOGLOBIN CONC: 33.4 g/dL (ref 32.0–36.0)
MEAN CORPUSCULAR HEMOGLOBIN: 30.3 pg (ref 25.9–32.4)
MEAN CORPUSCULAR VOLUME: 90.8 fL (ref 77.6–95.7)
MEAN PLATELET VOLUME: 7.2 fL (ref 6.8–10.7)
PLATELET COUNT: 234 10*9/L (ref 150–450)
RED BLOOD CELL COUNT: 4.03 10*12/L — ABNORMAL LOW (ref 4.26–5.60)
RED CELL DISTRIBUTION WIDTH: 14.3 % (ref 12.2–15.2)
WBC ADJUSTED: 3.2 10*9/L — ABNORMAL LOW (ref 3.6–11.2)

## 2021-07-05 LAB — LIPASE: LIPASE: 73 U/L — ABNORMAL HIGH (ref 12–53)

## 2021-07-06 ENCOUNTER — Encounter
Admit: 2021-07-06 | Discharge: 2021-07-07 | Disposition: A | Discharge status: Home / Self Care | Payer: PRIVATE HEALTH INSURANCE | Attending: Anesthesiology | Admitting: Internal Medicine

## 2021-07-06 ENCOUNTER — Ambulatory Visit
Admit: 2021-07-06 | Discharge: 2021-07-07 | Disposition: A | Discharge status: Home / Self Care | Payer: PRIVATE HEALTH INSURANCE | Admitting: Internal Medicine

## 2021-07-06 ENCOUNTER — Encounter
Admit: 2021-07-06 | Discharge: 2021-07-07 | Disposition: A | Discharge status: Home / Self Care | Payer: PRIVATE HEALTH INSURANCE | Attending: Internal Medicine | Admitting: Internal Medicine

## 2021-07-06 LAB — CBC W/ AUTO DIFF
BASOPHILS ABSOLUTE COUNT: 0 10*9/L (ref 0.0–0.1)
BASOPHILS RELATIVE PERCENT: 0.7 %
EOSINOPHILS ABSOLUTE COUNT: 0.1 10*9/L (ref 0.0–0.5)
EOSINOPHILS RELATIVE PERCENT: 3.3 %
HEMATOCRIT: 36.6 % — ABNORMAL LOW (ref 39.0–48.0)
HEMOGLOBIN: 12.2 g/dL — ABNORMAL LOW (ref 12.9–16.5)
LYMPHOCYTES ABSOLUTE COUNT: 1.5 10*9/L (ref 1.1–3.6)
LYMPHOCYTES RELATIVE PERCENT: 46.8 %
MEAN CORPUSCULAR HEMOGLOBIN CONC: 33.4 g/dL (ref 32.0–36.0)
MEAN CORPUSCULAR HEMOGLOBIN: 30.3 pg (ref 25.9–32.4)
MEAN CORPUSCULAR VOLUME: 90.8 fL (ref 77.6–95.7)
MEAN PLATELET VOLUME: 7.2 fL (ref 6.8–10.7)
MONOCYTES ABSOLUTE COUNT: 0.6 10*9/L (ref 0.3–0.8)
MONOCYTES RELATIVE PERCENT: 18.6 %
NEUTROPHILS ABSOLUTE COUNT: 1 10*9/L — ABNORMAL LOW (ref 1.8–7.8)
NEUTROPHILS RELATIVE PERCENT: 30.6 %
PLATELET COUNT: 234 10*9/L (ref 150–450)
RED BLOOD CELL COUNT: 4.03 10*12/L — ABNORMAL LOW (ref 4.26–5.60)
RED CELL DISTRIBUTION WIDTH: 14.3 % (ref 12.2–15.2)
WBC ADJUSTED: 3.2 10*9/L — ABNORMAL LOW (ref 3.6–11.2)

## 2021-07-06 LAB — RENAL FUNCTION PANEL
ALBUMIN: 3.7 g/dL (ref 3.4–5.0)
ANION GAP: 8 mmol/L (ref 5–14)
BLOOD UREA NITROGEN: 20 mg/dL (ref 9–23)
BUN / CREAT RATIO: 11
CALCIUM: 9 mg/dL (ref 8.7–10.4)
CHLORIDE: 105 mmol/L (ref 98–107)
CO2: 26 mmol/L (ref 20.0–31.0)
CREATININE: 1.8 mg/dL — ABNORMAL HIGH
EGFR CKD-EPI (2021) MALE: 49 mL/min/{1.73_m2} — ABNORMAL LOW (ref >=60)
GLUCOSE RANDOM: 87 mg/dL (ref 70–179)
PHOSPHORUS: 2.3 mg/dL — ABNORMAL LOW (ref 2.4–5.1)
POTASSIUM: 3.9 mmol/L (ref 3.4–4.8)
SODIUM: 139 mmol/L (ref 135–145)

## 2021-07-06 LAB — CBC
HEMATOCRIT: 33.9 % — ABNORMAL LOW (ref 39.0–48.0)
HEMOGLOBIN: 11.7 g/dL — ABNORMAL LOW (ref 12.9–16.5)
MEAN CORPUSCULAR HEMOGLOBIN CONC: 34.5 g/dL (ref 32.0–36.0)
MEAN CORPUSCULAR HEMOGLOBIN: 31.4 pg (ref 25.9–32.4)
MEAN CORPUSCULAR VOLUME: 91 fL (ref 77.6–95.7)
MEAN PLATELET VOLUME: 6.7 fL — ABNORMAL LOW (ref 6.8–10.7)
PLATELET COUNT: 195 10*9/L (ref 150–450)
RED BLOOD CELL COUNT: 3.73 10*12/L — ABNORMAL LOW (ref 4.26–5.60)
RED CELL DISTRIBUTION WIDTH: 14.5 % (ref 12.2–15.2)
WBC ADJUSTED: 2.9 10*9/L — ABNORMAL LOW (ref 3.6–11.2)

## 2021-07-06 LAB — SLIDE REVIEW

## 2021-07-06 LAB — MAGNESIUM: MAGNESIUM: 1.8 mg/dL (ref 1.6–2.6)

## 2021-07-06 MED ADMIN — enoxaparin (LOVENOX) syringe 40 mg: 40 mg | SUBCUTANEOUS | @ 15:00:00 | Stop: 2021-07-06

## 2021-07-06 MED ADMIN — MORPhine injection 2 mg: 2 mg | INTRAVENOUS | @ 14:00:00 | Stop: 2021-07-20

## 2021-07-06 MED ADMIN — MORPhine 4 mg/mL injection 6 mg: 6 mg | INTRAVENOUS | @ 03:00:00 | Stop: 2021-07-05

## 2021-07-06 MED ADMIN — lactated ringers bolus 1,000 mL: 1000 mL | INTRAVENOUS | @ 03:00:00 | Stop: 2021-07-05

## 2021-07-06 MED ADMIN — MORPhine 4 mg/mL injection 8 mg: 8 mg | INTRAVENOUS | @ 07:00:00 | Stop: 2021-07-06

## 2021-07-06 MED ADMIN — pantoprazole (PROTONIX) EC tablet 20 mg: 20 mg | ORAL | @ 15:00:00

## 2021-07-06 MED ADMIN — erythromycin (ROMYCIN) 5 mg/gram (0.5 %) ophthalmic ointment: OPHTHALMIC | @ 10:00:00

## 2021-07-06 MED ADMIN — peg-electrolyte soln (GoLYTELY) solution 2,000 mL: 2000 mL | ORAL | @ 23:00:00 | Stop: 2021-07-07

## 2021-07-06 MED ADMIN — amLODIPine (NORVASC) tablet 5 mg: 5 mg | ORAL | @ 23:00:00

## 2021-07-06 MED ADMIN — acetaminophen (TYLENOL) tablet 650 mg: 650 mg | ORAL | @ 10:00:00

## 2021-07-06 MED ADMIN — predniSONE (DELTASONE) tablet 5 mg: 5 mg | ORAL | @ 15:00:00

## 2021-07-06 MED ADMIN — acetaminophen (TYLENOL) tablet 650 mg: 650 mg | ORAL | @ 21:00:00

## 2021-07-06 MED ADMIN — ALPRAZolam (XANAX) tablet 0.5 mg: .5 mg | ORAL | @ 21:00:00

## 2021-07-06 MED ADMIN — erythromycin (ROMYCIN) 5 mg/gram (0.5 %) ophthalmic ointment: OPHTHALMIC | @ 21:00:00

## 2021-07-06 MED ADMIN — MORPhine injection 2 mg: 2 mg | INTRAVENOUS | @ 21:00:00 | Stop: 2021-07-20

## 2021-07-06 NOTE — Unmapped (Addendum)
Emergency Department Provider Note      ED Assessment and MDM      Ryan Moon is a 36 y.o. male w/ a history of a history of vasculitis with cadaveric renal transplant (2020) on belatacept and prednisone who presents with acute right lower quadrant abdominal pain with bright red blood per rectum as well as left lower eyelid tenderness and redness in the setting of recent neutropenia and 1 day of chills.    On presentation, vitals were WNL on room air. Physical exam revealed well-appearing adult male with right lower quadrant abdominal tenderness, guaiac positive rectal exam, and a stye on his left lower eye.     Initial impression was concern for complications of his neutropenia and immunosuppression including typhlitis, pancreatitis, nephrolithiasis, and UTI.    CBC showing improved neutropenia (ANC 1000 from 500).  CMP with AKI (creatinine 2.06 from baseline of around 1.5).  Lipase mildly elevated at 73.  Urinalysis with trace protein and large blood but no signs of infection.  CT abdomen pelvis without contrast with mild surrounding inflammatory stranding of the transplanted kidney but no bowel abnormalities.  Renal ultrasound transplant pending.    Began treatment of his pain with morphine and began hydration with 1 L fluids.    Given his new onset right lower quadrant abdominal pain with AKI, proteinuria, hematuria, evidence of stranding around his transplanted kidney I am concerned for rejection.  This is particularly concerning in the setting of his recently discontinued Myfortic due to neutropenia.  I contacted medicine for admission for further work-up of potential kidney transplant rejection.    As to his hematochezia, no evidence of typhlitis on CT scan.  Given that his hemoglobin is stable and his vitals are stable I feel that he is safe for serial hemoglobin checks and later evaluation with endoscopy.    History     Chief Complaint: Abdominal pain, rectal bleeding, left eye pain    HPI: Ryan Moon is a 36 y.o. male w/ a history of a history of vasculitis with cadaveric renal transplant (2020) on belatacept and prednisone who presents with multiple complaints.  He notes that he was in his usual state of health until yesterday when he began to get tenderness and swelling of his left lower eyelid with a scant amount of discharge.  He denies pain in his eyeball, blurry vision, pain with extraocular eye movement, facial swelling, and diplopia.  He states this is never happened to him before.  He reports that he went to an urgent care this morning and was prescribed Bactrim.  He then notes that this morning he began to have intermittent episodes of right lower quadrant abdominal pain that he describes as stabbing in quality, nonradiating, and with no clear trigger or alleviating factors.  He has had an appendectomy.  He denies fevers but states that he began to feel he had chills today.  He denies nausea, vomiting, diarrhea, dysuria, hematuria, urinary frequency, decreased urination.  He then notes that at about 7:00 PM he had a single episode of BRBPR.  He denies melena.  He states that he has had several episodes of BRBPR over the last few months but has never been worked up for that before.  He reports that over the last 2 months he has been off of his mycophenolate because of something in my labs that they wanted to look better (suspected neutropenia based on chart review).  Otherwise has been on his other 2 immunosuppressive's.  PMH/PSH: As noted in HPI     Allergies:   Allergies   Allergen Reactions   ??? Acetaminophen Nausea And Vomiting       Social:   Social History     Tobacco Use   ??? Smoking status: Former     Types: Cigarettes     Quit date: 05/22/2018     Years since quitting: 3.1   ??? Smokeless tobacco: Former     Types: Chew   Substance Use Topics   ??? Alcohol use: Not Currently     Comment: Patient had a DUI at age 1. Until 5/16 patient would have 1-2 beers per day.    ??? 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 Patient also reports having smoked  marijuana daily until 01/2018       Physical Exam     Vitals:   Vitals:    07/05/21 2010 07/05/21 2058   BP:  124/90   Pulse: 77    Resp: 16 18   Temp:  36.6 ??C (97.9 ??F)   TempSrc:  Oral   SpO2: 99% 99%        Physical Exam:   General: Well-appearing adult male in no apparent distress  HEENT: Head atraumatic. Moist mucous membranes. No scleral icterus or conjunctivitis.  Extraocular movements intact.  Left lower eyelid with redness and swelling on the medial aspect that is tender.  Pale area on the conjunctival membrane consistent with stye.  Cardiac: RRR. No MRG.    Pulmonary: Breathing comfortably w/ no accessory muscle usage. Clear to auscultation bilaterally.    Abdominal: Soft, nondistended abdomen with tenderness in the right lower quadrant.  Renal transplant scar appears with no surrounding redness or tenderness  GU: Normal-appearing external anus with no hemorrhoids internal or external.  Guaiac positive.  MSK: No lower extremity edema.    Dermatologic: No jaundice or pallor.    Neurologic: Alert. Follows commands. Moving all extremities spontaneously.   Psychiatric: Normal mood and behavior.       Glean Salen, MD   PGY-3, Emergency Medicine                   Sheppard Evens, MD  Resident  07/06/21 1610       Sheppard Evens, MD  Resident  07/06/21 [phone]

## 2021-07-06 NOTE — Unmapped (Signed)
Patient called on call nurse.  He stated he went to [facility] earlier today due to an eye infection that is not actually in his eye but in the skin around his eye.  [facility] stated it was either MRSA or staph infection.  They prescribed Bactrim 800mg -160mg  BID for 7 days.  He stated that in the last 2 hours he has had some sharp pain that comes and goes over the site of his kidney transplant.  He denied urinary changes except for maybe a slight decrease in pressure.  He stated that he has been drinking, today probably 6 bottles of water and 2 big glasses of tea.  He has not taken his temperature.  He stated he was getting over the flu in January when his Cr was 1.79.  He was instructed tog et labs and leave a urine sample in the am.  He was also told to re-page if he develops new or worsening symptoms.  Primary coordinator notified via routed message.

## 2021-07-06 NOTE — Unmapped (Addendum)
Outpatient follow up:  [ ]  Follow up colonoscopy path results: GI will set up follow up  [ ]  Transplant Nephrology appt 2/9: patient did have microscopic hematuria and inflammatory stranding around transplanted kidney    ----------------------------------------------------  This is a 36 year old man with a history of ANCA + GN, LDKT in 2020, HTN, and GERD who was admitted with right lower quadrant pain, acute-on-chronic kidney injury (resolved), and a single episode of BRBPR.     RLQ abdominal pain - One episode of BRBPR  Patient presented with a day of relatively acute onset right lower quadrant pain and flank pain.  He was afebrile with normal vitals, and initial labs notable for leukopenia to 3.2 and ANC of 1.0, improved from previous.  No evidence of stone or other acute findings on CT, although full study limited by lack of contrast.  Renal ultrasound of transplant without significant changes from prior studies.     GI was consulted, and patient underwent colonoscopy on 2/3.  He had nonbleeding external and internal hemorrhoids, considered likely etiology of rectal bleeding.  Also had several polyps that were removed, and GI will set up outpatient appointment and follow-up on pathology.    By time of discharge, his pain had improved.  He felt ready for discharge with return precautions given for new or worsening symptoms.     Acute on chronic renal injury - Microscopic hematuria - History of renal transplant  Initial creatinine was greater than 2, with baseline around 1.5-1.8.  Initially held his antihypertensive medications, and his creatinine had returned close to baseline by time of discharge. CT without contrast was notable for mild surrounding inflammatory stranding around transplanted kidney. Transplant nephrology was consulted during this admission, and the team noted that he did not have tenderness over his transplant site.  Given improved creatinine and lack of evidence of an infection, no acute inpatient work-up indicated, and the patient will follow closely with outpatient transplant nephrology team.    External hordeolum  He had started Bactrim the day prior to admission for external hordeolum of lower eyelid.  No vision changes. Discontinued his Bactrim, but did treat with topical erythromycin given his immunocompromised status.  His symptoms had improved by discharge.  Encouraged warm compresses on discharge home.

## 2021-07-06 NOTE — Unmapped (Signed)
Arrives w/ C/C of ABD pain, blood in stool and L eye infection.

## 2021-07-06 NOTE — Unmapped (Signed)
Gastroenterology ([facility]) Consult Service   Initial Consultation         Assessment and Recommendations:   Ryan Moon is a 36 y.o. 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 Pt is seen in consultation at the request of Hulen Skains, MD (Infectious Disease ([facility])) for hematochezia and abdominal pain.    In this patient with focal tenderness over RLQ kidney transplant site, surgically absent appendix, CT findings supportive of inflammatory process surrounding renal transplant though overall unremarkable renal US, Cr elevated above baseline, Hx of glomerulonephritis 2/2 ANCA+ vasculitis, normal LFTs, normocytic anemia with Hgb 11-12s consistent with baseline, no overt signs/symptoms of active infectious process though in the setting of leukopenia on immunosuppressive regimen, the potential GI contribution to presenting symptoms in this patient is somewhat unclear.     Overall suspicion for ischemic colitis in the setting of ANCA+ vasculitis is somewhat low; however, in the absence of other obvious unifying etiology, further evaluation with colonoscopy would likely be most appropriate if desired by primary team. Other etiology including hemorrhoid, infectious colitis remain on differential at this time.     GI Pre-Procedure Checklist  Procedure: Colonoscopy  Anticipated Date of Procedure: 07/07/2021  Anticoagulants/Antiplatelets: Hold DVT prophylaxis day of procedure  Diet: Order clear liquid and make NPO at 12AM (midnight) day of procedure  Prep: Give 4L Golytely bowel prep this afternoon. Order bowel prep using MED GI PROCEDURES PREP order-set. You must select the appropriate prep, this is not auto-selected. If Golytely is not available from pharmacy, please give Gatorade+Miralax prep. The patient is adequately prepped when their stool is clear and yellow, like urine. Continue to order bowel prep until the patients stools are clear. The patient can continue drinking prep past midnight if not clear, but must be strictly NPO of all oral intake for at least 2 hours before procedure.       Thank you for involving [place] in the care of your patient. We will continue to follow along with you.    For questions, please contact the on-call fellow for the Gastroenterology ([facility]) Consult Service at [phone].     Reason for Consultation:   Pt was seen in consultation at the request of Hulen Skains, MD (Infectious Disease ([facility])) for hematochezia and abdominal pain.    Subjective:   Subjective:   HPI:  Ryan Moon is a 36 y.o. 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    Per primary team documentation, patient developed acute-onset colicky sharp RLQ pain and a single episode of BRBPR after initiating Bactrim for eye swelling/discharge. No alleviation of abd pain with APAP or defecation, no other BM since BRBPR episode. Also endorses NBNB emesis x1 today AM. Notably has previously experienced bloody BM 6 months prior but did not pursue medical evaluation at that time. Lives with wife and three children including baby with unspecified rash, but otherwise no clear sick contacts.    Patient was interviewed and examined by GI team, and the patient overall affirms history as documented by primary team. States that over the past month he has experienced extremely brief similar episodes of RLQ abd pain, occurring approximately 2 weeks apart that  he describes as having pulled something. His current abd pain is 6/10 and similar in character but more prolonged, occasionally extending up the lateral aspect of his R abd and down into his R inguinal region. He feels that his L eyelid swelling is overall more concerning to him at this time. His daughter's rash as described above as been diagnosed as impetigo, but otherwise denies any sick contacts or changes in PO intake. He denies any fevers, chills, diarrhea, constipation, change in stool consistency, or weight changes. He attributes his single episode of NBNB emesis yesterday with having recent taken his Bactrim, which he has discontinued at this time per advisement by Nephrology and denies any other issues with nausea or vomiting. He reaffirms one prior episode of BRBPR approx 64mo ago that he states was examined by his wife who felt appeared consistent with her own stools and attributes this to likely hemorrhoids. Most recent episode of hematochezia yesterday was similar in appearance to this episode 64months prior. Has a bowel movement approximately every 1-2 days, often with blood on toilet paper. DRE was performed by GI fellow at bedside without overt blood or hemorrhoid appreciated, with minimal pain on bearing down reported by patient during exam.     Medical History:  Past Medical History:   Diagnosis Date   ??? Renal vasculitis (CMS-HCC)    ??? Secondary hyperparathyroidism (CMS-HCC)        Surgical History:  Past Surgical History:   Procedure Laterality Date   ??? APPENDECTOMY  2013   ??? NEPHRECTOMY TRANSPLANTED ORGAN     ??? PR TRANSPLANT,PREP CADAVER RENAL GRAFT Right 04/28/2019    Procedure: [facility] STD PREP CAD DONR RENAL ALLOGFT PRIOR TO TRNSPLNT, INCL DISSEC/REM PERINEPH FAT, DIAPH/RTPER ATTAC;  Surgeon: Doyce Loose, MD;  Location: MAIN OR [facility];  Service: Transplant   ??? PR TRANSPLANTATION OF KIDNEY Right 04/28/2019    Procedure: RENAL ALLOTRANSPLANTATION, IMPLANTATION OF GRAFT; WITHOUT RECIPIENT NEPHRECTOMY;  Surgeon: Doyce Loose, MD;  Location: MAIN OR [facility];  Service: Transplant       Social History:  Social History Tobacco Use   ??? Smoking status: Former     Types: Cigarettes     Quit date: 05/22/2018     Years since quitting: 3.1   ??? Smokeless tobacco: Former     Types: Chew   Substance Use Topics   ??? Alcohol use: Not Currently     Comment: Patient had a DUI at age 65. Until 5/16 patient would have 1-2 beers per day.    ??? 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 Patient also reports having smoked  marijuana daily until 01/2018       Family History:  Family History   Problem Relation Age of Onset   ??? Kidney disease Neg Hx           Objective:   Objective:   Physical Exam:  Temp:  [36.6 ??C (97.9 ??F)] 36.6 ??C (97.9 ??F)  Heart Rate:  [54-77] 64  SpO2 Pulse:  [54-73] 64  Resp:  [10-22] 13  BP: (112-135)/(77-98) 135/98  SpO2:  [93 %-99 %] 95 %    Gen: WDWN male in NAD, answers questions appropriately  Eyes: Sclera anicteric, EOMI  HENT: Atraumatic, normocephalic, MMM  Heart: RRR, S1, S2, no M/R/G  Lungs: CTAB, no crackles or wheezes, no use of accessory muscles  Abdomen: Normoactive bowel sounds, soft. TTP RUQ+RLQ, otherwise nontender, no rebound/guarding, no hepatosplenomegaly  Extremities: No clubbing, cyanosis, or edema in the BLEs  Neuro: Normal speech. No focal deficits.  Skin:  No rashes, lesions on clothed exam  Psych: Alert, normal mood and affect.     Pertinent Labs/Studies Reviewed:  All Labs Last 24hrs:   Recent Results (from the past 24 hour(s))   Comprehensive metabolic panel    Collection Time: 07/05/21 10:09 PM   Result Value Ref Range    Sodium 136 135 - 145 mmol/L    Potassium 4.4 3.4 - 4.8 mmol/L    Chloride 103 98 - 107 mmol/L    CO2 26.0 20.0 - 31.0 mmol/L    Anion Gap 7 5 - 14 mmol/L    BUN 24 (H) 9 - 23 mg/dL    Creatinine 1.61 (H) 0.60 - 1.10 mg/dL    BUN/Creatinine Ratio 12     eGFR CKD-EPI (2021) Male 42 (L) >=60 mL/min/1.47m2    Glucose 93 70 - 179 mg/dL    Calcium 8.9 8.7 - 09.6 mg/dL    Albumin 4.0 3.4 - 5.0 g/dL    Total Protein 7.2 5.7 - 8.2 g/dL    Total Bilirubin 0.3 0.3 - 1.2 mg/dL    AST 30 <=04 U/L    ALT 31 10 - 49 U/L    Alkaline Phosphatase 67 46 - 116 U/L   CBC w/ Differential    Collection Time: 07/05/21 10:09 PM   Result Value Ref Range    WBC 3.2 (L) 3.6 - 11.2 10*9/L    RBC 4.03 (L) 4.26 - 5.60 10*12/L    HGB 12.2 (L) 12.9 - 16.5 g/dL    HCT 54.0 (L) 98.1 - 48.0 %    MCV 90.8 77.6 - 95.7 fL    MCH 30.3 25.9 - 32.4 pg    MCHC 33.4 32.0 - 36.0 g/dL    RDW 19.1 47.8 - 29.5 %    MPV 7.2 6.8 - 10.7 fL    Platelet 234 150 - 450 10*9/L    Neutrophils % 30.6 %    Lymphocytes % 46.8 %    Monocytes % 18.6 %    Eosinophils % 3.3 %    Basophils % 0.7 %    Absolute Neutrophils 1.0 (L) 1.8 - 7.8 10*9/L    Absolute Lymphocytes 1.5 1.1 - 3.6 10*9/L    Absolute Monocytes 0.6 0.3 - 0.8 10*9/L    Absolute Eosinophils 0.1 0.0 - 0.5 10*9/L    Absolute Basophils 0.0 0.0 - 0.1 10*9/L   Lipase Level    Collection Time: 07/05/21 10:09 PM   Result Value Ref Range    Lipase 73 (H) 12 - 53 U/L   Morphology Review    Collection Time: 07/05/21 10:09 PM   Result Value Ref Range    Smear Review Comments See Comment (A) Undefined   COVID-19 PCR    Collection Time: 07/05/21 10:10 PM    Specimen: Nasopharyngeal Swab   Result Value Ref Range    SARS-CoV-2 PCR Negative Negative   Urinalysis with Microscopy with Culture Reflex    Collection Time: 07/05/21 10:21 PM    Specimen: Clean Catch; Urine   Result Value Ref Range    Color, UA Colorless     Clarity, UA Clear     Specific Gravity, UA 1.009 1.003 - 1.030    pH, UA 5.5 5.0 - 9.0    Leukocyte Esterase, UA Negative Negative  Nitrite, UA Negative Negative    Protein, UA Trace (A) Negative    Glucose, UA Negative Negative    Ketones, UA Negative Negative    Urobilinogen, UA <2.0 mg/dL <1.6 mg/dL    Bilirubin, UA Negative Negative    Blood, UA Large (A) Negative    RBC, UA 18 (H) <=3 /HPF    WBC, UA <1 <=2 /HPF    Squam Epithel, UA <1 0 - 5 /HPF Bacteria, UA None Seen None Seen /HPF   Magnesium Level    Collection Time: 07/06/21  6:23 AM   Result Value Ref Range    Magnesium 1.8 1.6 - 2.6 mg/dL   CBC    Collection Time: 07/06/21  6:23 AM   Result Value Ref Range    WBC 2.9 (L) 3.6 - 11.2 10*9/L    RBC 3.73 (L) 4.26 - 5.60 10*12/L    HGB 11.7 (L) 12.9 - 16.5 g/dL    HCT 10.9 (L) 60.4 - 48.0 %    MCV 91.0 77.6 - 95.7 fL    MCH 31.4 25.9 - 32.4 pg    MCHC 34.5 32.0 - 36.0 g/dL    RDW 54.0 98.1 - 19.1 %    MPV 6.7 (L) 6.8 - 10.7 fL    Platelet 195 150 - 450 10*9/L   Renal Function Panel    Collection Time: 07/06/21  6:23 AM   Result Value Ref Range    Sodium 139 135 - 145 mmol/L    Potassium 3.9 3.4 - 4.8 mmol/L    Chloride 105 98 - 107 mmol/L    CO2 26.0 20.0 - 31.0 mmol/L    Anion Gap 8 5 - 14 mmol/L    BUN 20 9 - 23 mg/dL    Creatinine 4.78 (H) 0.60 - 1.10 mg/dL    BUN/Creatinine Ratio 11     eGFR CKD-EPI (2021) Male 49 (L) >=60 mL/min/1.34m2    Glucose 87 70 - 179 mg/dL    Calcium 9.0 8.7 - 29.5 mg/dL    Phosphorus 2.3 (L) 2.4 - 5.1 mg/dL    Albumin 3.7 3.4 - 5.0 g/dL         CT A/P Wo Contrast 07/05/2021 - Final Read  IMPRESSION:  Right lower quadrant renal transplant with mild surrounding inflammatory stranding. Evaluation is limited due to lack of IV contrast. Recommend correlation with urinalysis to exclude infection or inflammatory process. Additionally, consider renal transplant ultrasound  for further evaluation. No hydronephrosis.  No evidence of typhlitis as clinically questioned.    US Renal Transplant W Doppler 07/06/2021 - Final Read  IMPRESSION:  --No significant change compared with prior study. Stable resistive indices in the renal transplant arteries, within normal limits, except at the main renal artery anastomosis which is slightly above normal limits.??  --The right lower quadrant renal transplant is unremarkable in appearance. No hydronephrosis. No perinephric fluid collection.  ??    Bishop Dublin, MD  Resident Physician PGY-1  [facility], [place], [place]

## 2021-07-06 NOTE — Unmapped (Signed)
Kidney transplant 2 years ago. RLQ pain began today. Denies injury or illness or surgery to abd other than transplant. Denies dysuria. Bright bred bloody output began today, taking aspirin. Denies fever.

## 2021-07-06 NOTE — Unmapped (Signed)
Patient re-paged.  He stated he went to the bathroom and had bright red and dark red stool.  He was advised to go to [facility] ER to be evaluated.  He voiced understanding and agreement.

## 2021-07-06 NOTE — Unmapped (Signed)
Infectious Disease ([facility]) History & Physical    Assessment & Plan:   Ryan Moon is a 36 y.o. male with PMHx of ANCA + GN, LDKT in 2020, HTN, GERD who presents from home with left lower eyelid swelling, RLQ pain, and a single episode of BRBPR.   Active Problems:    Kidney transplant 04/28/2019    Essential hypertension    Gastroesophageal reflux disease without esophagitis  Resolved Problems:    * No resolved hospital problems. *    RLQ abdominal pain   Patient with significant abdominal pain that is right sided. He is tender over his transplant site. Imaging (CT and renal ultrasound) without signs of pyelonephritis or intraabdominal infection.  Hepatic functional panel overall normal and lipase not significantly elevated (also no findings of pancreatitis on CT). Given elevated creatinine and pain over transplant, concern for rejection. Unclear how much his BRBPR is related to his abdominal pain vs. A separate process.    - NPO in case of kidney bx   - continue to monitor     Hx of Renal transplant in 2020 - AKI   Follows with Dr. Toni Moon, per notes MMF was held due to leukopenia but has continued on prednisone 5 mg daily and belatacept 5mg /kg q monthly (supposed to get dose this week). Concern for rejection as mentioned above.   - follow DSA   - continue to hold MMF   - transplant nephrology consult   -continue home prednisone 5 mg   -strict I&O   -renal function panel daily     BRBPR   Patient presenting with one episode of bloody bm with bright red blood from home. Has not had subsequent episodes since arrival. He has never had a colonoscopy prior and has no history of IBD or other GI disorder. Hgb on arrival was stable from prior labs in Jan. DDX hemorrhoidal bleed vs. Diverticular bleed. No emergent need for scope given HDS and H/H stable, would re consider if clinical status changes.   - continue to trend H/H     Left Eye swelling and discharge   Patient presenting with swelling of lower eyelid. Low concern for orbital cellulitus given EOMI and no eye pain (just feels eyelid is swollen and tender). Appears to be an external hordeolum. Given immunocomprimised status will treat with erythromycin eye oointment and monitor for improvement.   - erythomycin eye ointment BID   - consider optho c/s if no improvement     HTN   - hold home losartan in the setting of AKI   - continue home amlodipine 5 mg     Hx of Panic attacks and anxiety   - continue home xanax 0.5 mg BID PRN     GERD  - continue home PPI         Daily Checklist:  Diet: NPO  DVT PPx: Lovenox 40mg  q24h  Electrolytes: No Repletion Needed  Code Status: Full Code    Chief Concern:   No Principal Problem: There is no principal problem currently on the Problem List. Please update the Problem List and refresh.    Subjective:   HPI:  Ryan Moon is a 36 y.o. male with PMHx of ANCA + GN, LDKT in 2020, HTN, GERD who presents from home with left lower eyelid swelling, RLQ pain, and a single episode of BRBPR.   ??  Went to UC on the day of presentation for eye swelling and purulent discharge. He was prescribed a course of  bactrim and took two doses. Later in the day he all of a sudden developed colicky RLQ pain that is sharp in nature. The pain did not imprve with OTC use of tylenol. There are no clear alleviating or exacerbating factors.     He then had a bright red bloody BM. The pain did not go away after the BM.  Has not had a repeat BM since that time but has had gas. Had a bloody BM in the past about 6 mo ago but did not seek care. He has not experienced any lightheadedness or dizziness since that time. He has endorsed chills today but was unsure about his temperature. He has had nausea and endorses an episode of vomiting this morning that was NBNB. He lives with his wife and three kids. No one in the household is overtly sick kbut the baby does have a rash for which they are planning on visiting the pediatrician tomorrow.     In the emergency department he was afebrile, with a normal heartrate and normal BP. Initial labs notable for leukopenia to 3.2 with ANC of 1.0 (previously was 0.5), normocytic anemia to 12.2. Chemistry panel notable for elevated creatinine to 2.06 (baseline around 1.4-1.7), lipase elevated to 73. Urinalysis with trace protein and large blood but negative LE and nitrites. Ct abdomen pelvis w/o contrast was notable for Mild surrounding inflammatory stranding and without evidence of typhlitis. Renal ultrasound transplant protocol did not have significant changes from prior studies but did not have hydronephrosis or perinephric fluid collection.   ??  Designated Environmental health practitioner:  Ryan Moon currently has decisional capacity for healthcare decision-making and is able to designate a surrogate healthcare decision maker. Ryan Moon designated healthcare decision maker(s) is/are Ryan Moon (the patient's spouse) as denoted by stated patient preference.    Allergies:  Acetaminophen    Medications:   Prior to Admission medications    Medication Dose, Route, Frequency   losartan (COZAAR) 50 MG tablet 50 mg, Oral, 2 times a day (standard)   mycophenolate (MYFORTIC) 180 MG EC tablet 180 mg, Oral, 2 times a day (standard)   omeprazole (PRILOSEC) 20 MG capsule 20 mg, Oral, Daily (standard)   predniSONE (DELTASONE) 5 MG tablet 5 mg, Oral, Daily (standard)   ALPRAZolam (XANAX) 0.5 MG tablet 0.5 mg, Oral, 2 times a day PRN   amLODIPine (NORVASC) 5 MG tablet 5 mg, Oral, Every evening   aspirin (ECOTRIN) 81 MG tablet 81 mg, Oral, Daily (standard)   belatacept (NULOJIX) 250 mg SolR Belatacept  5 mg/kg every 2 weeks for 5 doses then once every 4 weeks   magnesium oxide-Mg AA chelate (MAGNESIUM, AMINO ACID CHELATE,) 133 mg Take 1 tablet by mouth daily.       Medical History:  Past Medical History:   Diagnosis Date   ??? Renal vasculitis (CMS-HCC)    ??? Secondary hyperparathyroidism (CMS-HCC)        Surgical History:  Past Surgical History:   Procedure Laterality Date   ??? APPENDECTOMY  2013   ??? NEPHRECTOMY TRANSPLANTED ORGAN     ??? PR TRANSPLANT,PREP CADAVER RENAL GRAFT Right 04/28/2019    Procedure: [facility] STD PREP CAD DONR RENAL ALLOGFT PRIOR TO TRNSPLNT, INCL DISSEC/REM PERINEPH FAT, DIAPH/RTPER ATTAC;  Surgeon: Doyce Loose, MD;  Location: MAIN OR [facility];  Service: Transplant   ??? PR TRANSPLANTATION OF KIDNEY Right 04/28/2019    Procedure: RENAL ALLOTRANSPLANTATION, IMPLANTATION OF GRAFT; WITHOUT RECIPIENT NEPHRECTOMY;  Surgeon: Doyce Loose, MD;  Location:  MAIN OR [facility];  Service: Transplant       Family History:   Family History   Problem Relation Age of Onset   ??? Kidney disease Neg Hx        Social History:  The patient lives with family    Social History     Tobacco Use   ??? Smoking status: Former     Types: Cigarettes     Quit date: 05/22/2018     Years since quitting: 3.1   ??? Smokeless tobacco: Former     Types: Chew   Substance Use Topics   ??? Alcohol use: Not Currently     Comment: Patient had a DUI at age 63. Until 5/16 patient would have 1-2 beers per day.    ??? 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 Patient also reports having smoked  marijuana daily until 01/2018        Review of Systems:  10 systems were reviewed and are negative unless otherwise mentioned in the HPI    Objective:   Physical Exam:  Temp:  [36.6 ??C (97.9 ??F)] 36.6 ??C (97.9 ??F)  Heart Rate:  [54-77] 55  SpO2 Pulse:  [54-64] 55  Resp:  [10-18] 11  BP: (113-131)/(81-90) 113/81  SpO2:  [94 %-99 %] 94 %    Gen:  NAD, answers questions appropriately  Eyes: Sclera anicteric, EOMI,left eye with swelling and erythema of lower eyelid with some drainage  HENT: atraumatic, normocephalic, MMM.   Heart: RRR, S1, S2, no M/R/G, no chest wall tenderness  Lungs: CTAB, no crackles or wheezes, no use of accessory muscles  Abdomen:some guarding, tender to palpation of RLQ and R flank   Extremities: no clubbing, cyanosis, or edema  Neuro: CN II-XI grossly intact. No focal deficits.  Skin:  No rashes, lesions on clothed exam  Psych: Alert, oriented, normal mood and affect.     Labs/Studies/Imaging:  Labs, Studies, Imaging from the last 24hrs per EMR and personally reviewed

## 2021-07-06 NOTE — Unmapped (Signed)
[facility]  Emergency Department Medical Screening Examination     Subjective     Ryan Moon is a 36 y.o. male presenting for evaluation of Abdominal Pain, Rectal Bleeding, and Eye Pain. The patient report RLQ abdominal pain, bright red blood from his rectum, and a left eye infection. History of kidney transplant      Abbreviated Review of Systems/Covid Screen  Constitutional: Negative for fever  Respiratory: Negative for cough. Negative for difficulty breathing.    Objective     ED Triage Vitals   Enc Vitals Group      BP 07/05/21 2058 124/90      Heart Rate 07/05/21 2010 77      SpO2 Pulse 07/05/21 2058 64      Resp 07/05/21 2010 16      Temp 07/05/21 2058 36.6 ??C (97.9 ??F)      Temp Source 07/05/21 2058 Oral      SpO2 07/05/21 2010 99 %       Focused Physical Exam  Constitutional: No acute distress.  Respiratory: Non-labored respirations.  Neurological: Clear speech. No gross focal neurologic deficits are appreciated.      Assessment & Plan       Plan for COVID-19 PCR, basic labs, UA, and US renal transplant with doppler. Will defer further workup and treatment to when bed placement becomes available.     A medical screening exam has been performed. At the time of this evaluation, no emergency medical condition requiring immediate stabilization has been identified nor is there suspicion for imminent decompensation. Appropriate triage protocols will be implemented and a comprehensive ED evaluation with disposition will be completed by a healthcare provider when an appropriate ED location becomes available. The patient is aware that this is an initial encounter only and verbalizes understanding and agreement with the plan.     Emergency Department operations continue to be impacted by the COVID-19 pandemic.    Documentation assistance was provided by Gar Gibbon, Scribe, on July 05, 2021 at 20:58 for Army Melia, MD     years ago.     Army Melia, MD  July 05, 2021 8:58 PM

## 2021-07-07 LAB — CBC
HEMATOCRIT: 39.6 % (ref 39.0–48.0)
HEMOGLOBIN: 12.9 g/dL (ref 12.9–16.5)
MEAN CORPUSCULAR HEMOGLOBIN CONC: 32.6 g/dL (ref 32.0–36.0)
MEAN CORPUSCULAR HEMOGLOBIN: 30.1 pg (ref 25.9–32.4)
MEAN CORPUSCULAR VOLUME: 92.2 fL (ref 77.6–95.7)
MEAN PLATELET VOLUME: 7.1 fL (ref 6.8–10.7)
PLATELET COUNT: 196 10*9/L (ref 150–450)
RED BLOOD CELL COUNT: 4.3 10*12/L (ref 4.26–5.60)
RED CELL DISTRIBUTION WIDTH: 14.5 % (ref 12.2–15.2)
WBC ADJUSTED: 2.3 10*9/L — ABNORMAL LOW (ref 3.6–11.2)

## 2021-07-07 LAB — RENAL FUNCTION PANEL
ALBUMIN: 4.2 g/dL (ref 3.4–5.0)
ANION GAP: 9 mmol/L (ref 5–14)
BLOOD UREA NITROGEN: 14 mg/dL (ref 9–23)
BUN / CREAT RATIO: 8
CALCIUM: 9.7 mg/dL (ref 8.7–10.4)
CHLORIDE: 103 mmol/L (ref 98–107)
CO2: 25 mmol/L (ref 20.0–31.0)
CREATININE: 1.8 mg/dL — ABNORMAL HIGH
EGFR CKD-EPI (2021) MALE: 49 mL/min/{1.73_m2} — ABNORMAL LOW (ref >=60)
GLUCOSE RANDOM: 104 mg/dL (ref 70–179)
PHOSPHORUS: 2.7 mg/dL (ref 2.4–5.1)
POTASSIUM: 4.1 mmol/L (ref 3.4–4.8)
SODIUM: 137 mmol/L (ref 135–145)

## 2021-07-07 LAB — MAGNESIUM: MAGNESIUM: 2 mg/dL (ref 1.6–2.6)

## 2021-07-07 MED ORDER — LIDOCAINE 5 % TOPICAL PATCH
MEDICATED_PATCH | Freq: Every day | TRANSDERMAL | 3 refills | 10 days | Status: CP | PRN
Start: 2021-07-07 — End: 2021-07-27

## 2021-07-07 MED ADMIN — MORPhine injection 2 mg: 2 mg | INTRAVENOUS | @ 09:00:00 | Stop: 2021-07-07

## 2021-07-07 MED ADMIN — sodium chloride (NS) 0.9 % infusion: 10 mL/h | INTRAVENOUS | @ 15:00:00 | Stop: 2021-07-07

## 2021-07-07 MED ADMIN — propofoL (DIPRIVAN) injection: INTRAVENOUS | @ 18:00:00 | Stop: 2021-07-07

## 2021-07-07 MED ADMIN — propofoL (DIPRIVAN) injection: INTRAVENOUS | @ 19:00:00 | Stop: 2021-07-07

## 2021-07-07 MED ADMIN — erythromycin (ROMYCIN) 5 mg/gram (0.5 %) ophthalmic ointment: OPHTHALMIC | @ 21:00:00 | Stop: 2021-07-07

## 2021-07-07 MED ADMIN — peg-electrolyte soln (GoLYTELY) solution 2,000 mL: 2000 mL | ORAL | @ 09:00:00 | Stop: 2021-07-07

## 2021-07-07 MED ADMIN — ondansetron (ZOFRAN) injection 4 mg: 4 mg | INTRAVENOUS | @ 09:00:00 | Stop: 2021-07-07

## 2021-07-07 MED ADMIN — MORPhine injection 2 mg: 2 mg | INTRAVENOUS | @ 05:00:00 | Stop: 2021-07-20

## 2021-07-07 MED ADMIN — pantoprazole (PROTONIX) EC tablet 20 mg: 20 mg | ORAL | @ 15:00:00 | Stop: 2021-07-07

## 2021-07-07 MED ADMIN — erythromycin (ROMYCIN) 5 mg/gram (0.5 %) ophthalmic ointment: OPHTHALMIC | @ 09:00:00 | Stop: 2021-07-07

## 2021-07-07 MED ADMIN — MORPhine injection 2 mg: 2 mg | INTRAVENOUS | @ 01:00:00 | Stop: 2021-07-20

## 2021-07-07 MED ADMIN — ALPRAZolam (XANAX) tablet 0.5 mg: .5 mg | ORAL | @ 21:00:00 | Stop: 2021-07-07

## 2021-07-07 MED ADMIN — predniSONE (DELTASONE) tablet 5 mg: 5 mg | ORAL | @ 15:00:00 | Stop: 2021-07-07

## 2021-07-07 MED ADMIN — MORPhine injection 2 mg: 2 mg | INTRAVENOUS | @ 21:00:00 | Stop: 2021-07-07

## 2021-07-07 MED ADMIN — ondansetron (ZOFRAN) injection 4 mg: 4 mg | INTRAVENOUS | @ 01:00:00

## 2021-07-07 MED ADMIN — MORPhine injection 2 mg: 2 mg | INTRAVENOUS | @ 15:00:00 | Stop: 2021-07-07

## 2021-07-07 NOTE — Unmapped (Signed)
Gastroenterology ([facility]) Consult Service   Treatment Plan         Recommendations:   Ryan Moon is a 36 y.o. 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 Pt is seen in consultation at the request of Hulen Skains, MD (Infectious Disease ([facility])) for hematochezia and abdominal pain.    Patient s/p colonoscopy 2/3. Patient found to have non-bleeding external and internal hemorrhoids, this is likely the etiology of his BRBPR. Additionally 4 sessile polyps were seen in the descending colon and were biopsied. Overall the patient has not had recurrence of the BRBPR, has remained HDS and Hgb stable. Would recommend maintaining soft regular bowel movements with high fiber diet, and can use miralax as needed. Patient will need repeat colonoscopy for full colorectal screening, likely in one year.    Recommendations:  - maintain soft stools with high fiber diet and miralax as needed  - we will arrange outpatient GI follow up  - follow up colonoscopy biopsies  - repeat colonoscopy in 1 year     Thank you for involving [place] in the care of your patient. We will sign-off at this time, please re-contact if additional questions or a new need for consultation arises.     For questions, please contact the on-call fellow for the Gastroenterology ([facility]) Consult Service at [phone].

## 2021-07-07 NOTE — Unmapped (Signed)
Pt admitted to 6BT 6321.

## 2021-07-07 NOTE — Unmapped (Signed)
Care Management  Initial Transition Planning Assessment              General  Care Manager assessed the patient by : Medical record review, Discussion with Clinical Care team  Orientation Level: Oriented X4  Functional level prior to admission: Independent  Reason for referral: Discharge Planning    Contact/Decision Maker  Extended Emergency Contact Information  Primary Emergency Contact: Gallion,[name]  Mobile Phone: [phone]  Relation: Spouse  Secondary Emergency Contact: tickle,lorie  Mobile Phone: [phone]  Relation: Mother    Legal Next of Kin / Guardian / POA / Advance Directives                      Readmission Information    Have you been hospitalized in the last 30 days?: No                                Did the following happen with your discharge?                                                     Patient Information  Lives with: Spouse/significant other, Children    Type of Residence: Private residence        Location/Detail: [address] [place] [address]    Support Systems/Concerns: Spouse, Family Members    Responsibilities/Dependents at home?: Yes (Describe)    Home Care services in place prior to admission?: No                  Equipment Currently Used at Home: none       Currently receiving outpatient dialysis?: No       Financial Information       Need for financial assistance?: No       Social Determinants of Health  Social Determinants of Health were addressed in provider documentation.  Please refer to patient history.    Complex Discharge Information    Is patient identified as a difficult/complex discharge?: No                                                               Interventions:       Discharge Needs Assessment  Concerns to be Addressed: discharge planning    Clinical Risk Factors: Multiple Diagnoses (Chronic)    Barriers to taking medications: No    Prior overnight hospital stay or ED visit in last 90 days: No              Anticipated Changes Related to Illness: none    Equipment Needed After Discharge: other (see comments) (Pending PT/OT recommendations)    Discharge Facility/Level of Care Needs: other (see comments) (Anticipate home with self care)    Readmission  Risk of Unplanned Readmission Score: UNPLANNED READMISSION SCORE: 10.28%  Predictive Model Details          10% (Low)  Factor Value    Calculated 07/07/2021 08:03 24% Number of active Rx orders 21    [facility] Risk of Unplanned Readmission Model 15% Active antipsychotic Rx  order present     11% Imaging order present in last 6 months     10% Phosphorous result present     9% Number of ED visits in last six months 1     7% Active corticosteroid Rx order present     7% Latest creatinine high (1.80 mg/dL)     6% Diagnosis of renal failure present     5% Age 36     3% Future appointment scheduled     2% Current length of stay 1.174 days     2% Active ulcer medication Rx order present      Readmitted Within the Last 30 Days? (No if blank)   Patient at risk for readmission?: Yes    Discharge Plan  Screen findings are: Care Manager reviewed the plan of the patient's care with the Multidisciplinary Team. No discharge planning needs identified at this time. Care Manager will continue to manage plan and monitor patient's progress with the team.    Expected Discharge Date:     Expected Transfer from Critical Care:         Patient and/or family were provided with choice of facilities / services that are available and appropriate to meet post hospital care needs?: Other (Comment) (Will offer choice if services are indicated)       Initial Assessment complete?: Yes

## 2021-07-07 NOTE — Unmapped (Signed)
Transplant Nephrology Consult     Requesting provider: Dr. Wendy Poet  Service requesting consult: [facility]  Reason for consult: Renal Transplant      Assessment/Recommendations: Ryan Moon is a 36 y.o. 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    # Kidney allograft function: (improved)  - Serum creatinine level is 1.8. This is improved from admission.   - Baseline creatinine is 1.5-1.8. He is currently at his baseline.    # Immunosuppression [High risk medical decision making for drug therapy requiring intensive monitoring for toxicity]  - Currently on belatacept 5mg /kg q.month  - Continue prednisone 5mg  daily  - Has been holding mycophenolate due to leukopenia    # BP management:  - Blood pressure has been at goal  - Continue amlodipine 5mg      # Bright Red Blood Per Rectum  - Agree with work up with colonoscopy per GI        Ellwood Sayers  Division of Nephrology and Hypertension  [facility]  07/06/2021  4:51 PM      _____________________________________________________________________________________        Transplant Background  Transplant Date: 04/28/2019  Induction therapy: alemtuzumab    Current maintenance immunosuppression: bela, prednisone  Post-transplant course: kidney bx 01/21: no acute/chr rejection.  Rejection episodes: none      History of Present Illness: Ryan Moon is a 36 y.o. 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 History was obtained from the patient and chart review. He began to have eye pain yesterday for which he reached out to his coordinator who told him to see an eye doctor. He saw them who gave him bactrim. He took the medication but then had RLQ pain. He thought it was due to dehydration so he tried to rehydrate, but the pain did not improve. He had a bowel movement that was bloody so he came to the hospital. He has had one prior episode in the past of a bloody bowel movement but he thought it was due to hemorrhoids. At that time he did not have pain so he did not seek medical attention. He has not had prior colonoscopies. He has not had blood in his stool or past kidney stones. The pain starts in his RLQ pain and radiates to his groin. It is worse with movement. He has not had any falls.             Medications:   Current Facility-Administered Medications   Medication Dose Route Frequency Provider Last Rate Last Admin   ??? acetaminophen (TYLENOL) tablet 650 mg  650 mg Oral Q4H PRN Gracelyn Nurse, MD   650 mg at 07/06/21 1550   ??? ALPRAZolam Prudy Feeler) tablet 0.5 mg  0.5 mg Oral BID PRN Gracelyn Nurse, MD   0.5 mg at 07/06/21 1604   ??? amLODIPine (NORVASC) tablet 5 mg  5 mg Oral QPM Anya L Bishop Limbo, MD       ??? bisacodyL (DULCOLAX) EC tablet 10 mg  10 mg Oral Nightly PRN Starleen Blue, MD       ??? erythromycin [facility]) 5 mg/gram (0.5 %) ophthalmic ointment   Left Eye Q12H Gracelyn Nurse, MD   Given at 07/06/21 1550   ??? melatonin tablet 3 mg  3 mg Oral Nightly PRN Gracelyn Nurse, MD       ??? MORPhine injection 2 mg  2 mg Intravenous Q4H PRN Starleen Blue, MD   2 mg at 07/06/21 1551   ??? ondansetron (ZOFRAN-ODT) disintegrating tablet 8 mg  8 mg Oral Q8H PRN Gracelyn Nurse, MD        Or   ??? ondansetron (ZOFRAN) injection 4 mg  4 mg Intravenous Q8H PRN Gracelyn Nurse, MD       ??? pantoprazole (PROTONIX) EC tablet 20 mg  20 mg Oral Daily Gracelyn Nurse, MD   20 mg at 07/06/21 1610   ??? peg-electrolyte soln (GoLYTELY) solution 2,000 mL  2,000 mL Oral Q12H SCH Starleen Blue, MD       ??? predniSONE (DELTASONE) tablet 5 mg  5 mg Oral Daily Gracelyn Nurse, MD   5 mg at 07/06/21 9604   ??? prochlorperazine (COMPAZINE) tablet 5 mg  5 mg Oral Q6H PRN Starleen Blue, MD         Current Outpatient Medications   Medication Sig Dispense Refill   ??? losartan (COZAAR) 50 MG tablet Take 1 tablet (50 mg total) by mouth Two (2) times a day. 60 tablet 11   ??? mycophenolate (MYFORTIC) 180 MG EC tablet Take 1 tablet (180 mg total) by mouth Two (2) times a day. 60 tablet 11   ??? omeprazole (PRILOSEC) 20 MG capsule Take 1 capsule (20 mg total) by mouth daily. 30 capsule 11   ??? predniSONE (DELTASONE) 5 MG tablet Take 1 tablet (5 mg total) by mouth daily. 30 tablet 11   ??? ALPRAZolam (XANAX) 0.5 MG tablet Take 0.5 mg by mouth two (2) times a day as needed.     ??? amLODIPine (NORVASC) 5 MG tablet Take 1 tablet (5 mg total) by mouth every evening. 30 tablet 11   ??? aspirin (ECOTRIN) 81 MG tablet Take 1 tablet (81 mg total) by mouth daily. 30 tablet 11   ??? belatacept (NULOJIX) 250 mg SolR Belatacept  5 mg/kg every 2 weeks for 5 doses then once every 4 weeks 14.5 mL 0   ??? magnesium oxide-Mg AA chelate (MAGNESIUM, AMINO ACID CHELATE,) 133 mg Take 1 tablet by mouth daily. 100 tablet 11        ALLERGIES  Acetaminophen    MEDICAL HISTORY  Past Medical History:   Diagnosis Date   ??? Renal vasculitis (CMS-HCC)    ??? Secondary hyperparathyroidism (CMS-HCC)         SOCIAL HISTORY  Social History     Socioeconomic History   ??? Marital status: Married   ??? Number of children: 2   ??? Highest education level: High school graduate   Tobacco Use   ??? Smoking status: Former     Types: Cigarettes     Quit date: 05/22/2018     Years since quitting: 3.1   ??? Smokeless tobacco: Former     Types: Chew   Substance and Sexual Activity   ??? Alcohol use: Not Currently     Comment: Patient had a DUI at age 31. Until 5/16 patient would have 1-2 beers per day.    ??? 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 Patient also reports having smoked  marijuana daily until 01/2018  FAMILY HISTORY  Family History   Problem Relation Age of Onset   ??? Kidney disease Neg Hx           Review of Systems:  A 12 system review of systems was negative except as noted in HPI.  Otherwise as per HPI, all other systems reviewed and negative    Physical Exam:  Vitals:    07/06/21 1551   BP: 136/90   Pulse: 68   Resp: 16   Temp:    SpO2: 97%     No intake/output data recorded.  No intake or output data in the 24 hours ending 07/06/21 1651    General: well-appearing, no acute distress  HEENT: anicteric sclera, oropharynx clear without lesions  CV: regular rate, normal rhythm, no murmurs, no gallops, no rubs, no peripheral edema  Lungs: clear to auscultation bilaterally, normal work of breathing  Abdomen: graft site non tender, pain along the RLQ and RUQ that is worse with palpation but without rebound  Skin: no visible lesions or rashes  Psych: alert, engaged, appropriate mood and affect  Musculoskeletal: no obvious deformities  Neuro: normal speech, no gross focal deficits     Test Results  Reviewed  Lab Results   Component Value Date    NA 139 07/06/2021    K 3.9 07/06/2021    CL 105 07/06/2021    CO2 26.0 07/06/2021    BUN 20 07/06/2021    CREATININE 1.80 (H) 07/06/2021    GLU 87 07/06/2021    CALCIUM 9.0 07/06/2021    ALBUMIN 3.7 07/06/2021    PHOS 2.3 (L) 07/06/2021           I have reviewed all relevant outside healthcare records related to the patient's kidney injury.

## 2021-07-08 MED ORDER — ERYTHROMYCIN 5 MG/GRAM (0.5 %) EYE OINTMENT
Freq: Two times a day (BID) | OPHTHALMIC | 0 refills | 5 days | Status: CP
Start: 2021-07-08 — End: 2021-07-13

## 2021-07-08 NOTE — Unmapped (Signed)
Problem: Adult Inpatient Plan of Care  Goal: Plan of Care Review  Outcome: Discharged to Home  Goal: Patient-Specific Goal (Individualized)  Outcome: Discharged to Home  Goal: Absence of Hospital-Acquired Illness or Injury  Outcome: Discharged to Home  Intervention: Identify and Manage Fall Risk  Recent Flowsheet Documentation  Taken 07/07/2021 0800 by Albertha Ghee, RN  Safety Interventions: low bed  Goal: Optimal Comfort and Wellbeing  Outcome: Discharged to Home  Goal: Readiness for Transition of Care  Outcome: Discharged to Home  Goal: Rounds/Family Conference  Outcome: Discharged to Home     Problem: Impaired Wound Healing  Goal: Optimal Wound Healing  Outcome: Discharged to Home

## 2021-07-08 NOTE — Unmapped (Signed)
Physician Discharge Summary [facility]  [address] [facility]  [address]  [place] [address]  Dept: [phone]  Loc: [phone]     Identifying Information:   Ryan Moon  [birth year]  [id number]    Primary Care Physician: Hassell Halim, MD     Code Status: Full Code    Admit Date: 07/05/2021    Discharge Date: 07/07/2021     Discharge To: Home    Discharge Service: [facility] - Infectious Disease Floor Team ([facility])     Discharge Attending Physician: Hulen Skains, MD    Discharge Diagnoses:   Principal Problem:    Right lower quadrant pain POA: Yes  Active Problems:    Kidney transplant 04/28/2019 POA: Not Applicable    Essential hypertension POA: Yes    Gastroesophageal reflux disease without esophagitis POA: Yes    Microscopic hematuria POA: Yes    Hordeolum external POA: Yes    Bright red rectal bleeding POA: Yes    Hemorrhoids POA: Yes    Colon polyps POA: Yes  Resolved Problems:    Acute-on-chronic kidney injury (CMS-HCC) POA: Yes      Hospital Course:   Outpatient follow up:  [ ]  Follow up colonoscopy path results: GI will set up follow up  [ ]  Transplant Nephrology appt 2/9: patient did have microscopic hematuria and inflammatory stranding around transplanted kidney    ----------------------------------------------------  This is a 36 year old man with a history of ANCA + GN, LDKT in 2020, HTN, and GERD who was admitted with right lower quadrant pain, acute-on-chronic kidney injury (resolved), and a single episode of BRBPR.     RLQ abdominal pain - One episode of BRBPR  Patient presented with a day of relatively acute onset right lower quadrant pain and flank pain.  He was afebrile with normal vitals, and initial labs notable for leukopenia to 3.2 and ANC of 1.0, improved from previous.  No evidence of stone or other acute findings on CT, although full study limited by lack of contrast.  Renal ultrasound of transplant without significant changes from prior studies.     GI was consulted, and patient underwent colonoscopy on 2/3.  He had nonbleeding external and internal hemorrhoids, considered likely etiology of rectal bleeding.  Also had several polyps that were removed, and GI will set up outpatient appointment and follow-up on pathology.    By time of discharge, his pain had improved.  He felt ready for discharge with return precautions given for new or worsening symptoms.     Acute on chronic renal injury - Microscopic hematuria - History of renal transplant  Initial creatinine was greater than 2, with baseline around 1.5-1.8.  Initially held his antihypertensive medications, and his creatinine had returned close to baseline by time of discharge. CT without contrast was notable for mild surrounding inflammatory stranding around transplanted kidney. Transplant nephrology was consulted during this admission, and the team noted that he did not have tenderness over his transplant site.  Given improved creatinine and lack of evidence of an infection, no acute inpatient work-up indicated, and the patient will follow closely with outpatient transplant nephrology team.    External hordeolum  He had started Bactrim the day prior to admission for external hordeolum of lower eyelid.  No vision changes. Discontinued his Bactrim, but did treat with topical erythromycin given his immunocompromised status.  His symptoms had improved by discharge.  Encouraged warm compresses on discharge home.    The patient's hospital stay has  been complicated by the following clinically significant conditions requiring additional evaluation and treatment or having a significant effect of this patient's care: - Chronic kidney disease POA requiring further investigation, treatment, or monitoring     Touchbase with Outpatient Provider:  Warm Handoff: Completed on 07/07/21 by Starleen Blue  (Intern) via Epic Secure Chat    Procedures:  colonoscopy  ______________________________________________________________________  Discharge Medications:        Your Medication List      START taking these medications    erythromycin 5 mg/gram (0.5 %) ophthalmic ointment  Commonly known as: ROMYCIN  Administer into the left eye every twelve (12) hours for 5 days.  Start taking on: July 08, 2021     lidocaine 5 % patch  Commonly known as: LIDODERM  Place 1 patch on the skin daily as needed for up to 20 days. Apply to affected area for 12 hours only each day (then remove patch)        CONTINUE taking these medications    ALPRAZolam 0.5 MG tablet  Commonly known as: XANAX  Take 0.5 mg by mouth two (2) times a day as needed.     amLODIPine 5 MG tablet  Commonly known as: NORVASC  Take 1 tablet (5 mg total) by mouth every evening.     aspirin 81 MG tablet  Commonly known as: ECOTRIN  Take 1 tablet (81 mg total) by mouth daily.     belatacept 250 mg Solr  Commonly known as: NULOJIX  Belatacept  5 mg/kg every 2 weeks for 5 doses then once every 4 weeks     losartan 50 MG tablet  Commonly known as: COZAAR  Take 1 tablet (50 mg total) by mouth Two (2) times a day.     magnesium (amino acid chelate) 133 mg  Generic drug: magnesium oxide-Mg AA chelate  Take 1 tablet by mouth daily.     MYFORTIC 180 MG EC tablet  Generic drug: mycophenolate  Take 1 tablet (180 mg total) by mouth Two (2) times a day.     omeprazole 20 MG capsule  Commonly known as: PriLOSEC  Take 1 capsule (20 mg total) by mouth daily.     predniSONE 5 MG tablet  Commonly known as: DELTASONE  Take 1 tablet (5 mg total) by mouth daily.            Allergies:  Acetaminophen  ______________________________________________________________________  Pending Test Results:  Pending Labs     Order Current Status    HLA DSA Post Transplant In process    Surgical pathology exam In process          Most Recent Labs:  All lab results last 24 hours -   Recent Results (from the past 24 hour(s))   Magnesium Level    Collection Time: 07/07/21  6:03 AM   Result Value Ref Range    Magnesium 2.0 1.6 - 2.6 mg/dL   CBC Collection Time: 07/07/21  6:03 AM   Result Value Ref Range    WBC 2.3 (L) 3.6 - 11.2 10*9/L    RBC 4.30 4.26 - 5.60 10*12/L    HGB 12.9 12.9 - 16.5 g/dL    HCT 16.1 09.6 - 04.5 %    MCV 92.2 77.6 - 95.7 fL    MCH 30.1 25.9 - 32.4 pg    MCHC 32.6 32.0 - 36.0 g/dL    RDW 40.9 81.1 - 91.4 %    MPV 7.1 6.8 - 10.7 fL  Platelet 196 150 - 450 10*9/L   Renal Function Panel    Collection Time: 07/07/21  6:03 AM   Result Value Ref Range    Sodium 137 135 - 145 mmol/L    Potassium 4.1 3.4 - 4.8 mmol/L    Chloride 103 98 - 107 mmol/L    CO2 25.0 20.0 - 31.0 mmol/L    Anion Gap 9 5 - 14 mmol/L    BUN 14 9 - 23 mg/dL    Creatinine 1.61 (H) 0.60 - 1.10 mg/dL    BUN/Creatinine Ratio 8     eGFR CKD-EPI (2021) Male 49 (L) >=60 mL/min/1.57m2    Glucose 104 70 - 179 mg/dL    Calcium 9.7 8.7 - 09.6 mg/dL    Phosphorus 2.7 2.4 - 5.1 mg/dL    Albumin 4.2 3.4 - 5.0 g/dL       Relevant Studies/Radiology:  CT Abdomen Pelvis Without Contrast (Renal Protocol)    Result Date: 07/06/2021  EXAM: CT ABDOMEN PELVIS WO CONTRAST DATE: 07/05/2021 10:41 PM ACCESSION: [id number] UN DICTATED: 07/05/2021 10:56 PM INTERPRETATION LOCATION: [facility] CLINICAL INDICATION: 36 years old with R/o typhlitis  COMPARISON: None. TECHNIQUE: A spiral CT scan was obtained without IV contrast from the lung bases to the pubic symphysis.  Images were reconstructed in the axial plane. Coronal and sagittal reformatted images were also provided for further evaluation. Evaluation of the solid organs and vasculature is limited in the absence of intravenous contrast. FINDINGS: LOWER CHEST: Unremarkable. LIVER: Normal liver contour. No focal liver lesion on non-contrast examination. BILIARY: No intrahepatic biliary ductal dilatation.  The gallbladder is normal in appearance. SPLEEN: Normal in size and contour. PANCREAS: Normal pancreatic contour without signs of inflammation or gross ductal dilatation. ADRENAL GLANDS: Normal appearance of the adrenal glands. KIDNEYS/URETERS: Cortical thinning of the bilateral native kidneys. Right lower quadrant renal transplant. There is mild stranding around the right renal transplant kidney. Otherwise, evaluation is limited due to lack of IV contrast. No hydronephrosis. BLADDER: Unremarkable. REPRODUCTIVE ORGANS: Unremarkable. GI TRACT: No findings of bowel obstruction or acute inflammation. PERITONEUM/RETROPERITONEUM AND MESENTERY: No free air. No ascites. No fluid collection. VASCULATURE: Normal caliber aorta. Otherwise, limited evaluation without contrast. LYMPH NODES: No adenopathy. BONES and SOFT TISSUES: No aggressive osseous lesions. No focal soft tissue lesions.     Right lower quadrant renal transplant with mild surrounding inflammatory stranding. Evaluation is limited due to lack of IV contrast. Recommend correlation with urinalysis to exclude infection or inflammatory process. Additionally, consider renal transplant ultrasound  for further evaluation. No hydronephrosis. No evidence of typhlitis as clinically questioned.    US Renal Transplant W Doppler    Result Date: 07/06/2021  EXAM: US RENAL TRANSPLANT W DOPPLER DATE: 07/06/2021 ACCESSION: [id number] UN DICTATED: 07/06/2021 1:12 AM INTERPRETATION LOCATION: [facility] CLINICAL INDICATION: 36 years old Male with RLQ pain COMPARISON: CT abdomen pelvis 07/05/2021, transplant renal ultrasound 06/07/2021 TECHNIQUE:  Ultrasound views of the renal transplant were obtained using gray scale and color and spectral Doppler imaging. Views of the urinary bladder were obtained using gray scale and limited color Doppler imaging. FINDINGS: TRANSPLANTED KIDNEY: The renal transplant was located in the right lower quadrant. Normal size and echogenicity.  No solid masses or calculi. No perinephric collections identified. No hydronephrosis. VESSELS: - Perfusion: Using power Doppler, normal perfusion was seen throughout the renal parenchyma. - Resistive indices in the renal transplant are stable compared with prior examination, with resistive index at the main renal artery anastomosis measuring slightly above normal limits,  similar to prior. - Main renal artery/iliac artery: Patent - Main renal vein/iliac vein: Patent BLADDER: Unremarkable. Patient voided prior to exam. Please see below for data measurements: Transplant location: RLQ Renal Transplant: Sagittal 10.31 cm; AP 6.39 cm; Transverse 5.37 cm Arcuate artery superior resistive index: 0.59 Arcuate artery mid resistive index: 0.65 Arcuate artery inferior resistive index: 0.59 Previous resistive indices range of arcuate arteries: 0.56-0.62 Segmental artery superior resistive index: 0.61 Segmental artery mid resistive index: 0.65 Segmental artery inferior resistive index: 0.56 Previous resistive indices range of segmental arteries: 0.55-0.58 Main renal artery hilum resistive index: 0.64 Main renal artery mid resistive index: 0.69 Main renal artery anastomosis resistive index: 0.76 Previous resistive indices range of main renal artery: 0.58-0.74     --No significant change compared with prior study. Stable resistive indices in the renal transplant arteries, within normal limits, except at the main renal artery anastomosis which is slightly above normal limits. --The right lower quadrant renal transplant is unremarkable in appearance. No hydronephrosis. No perinephric fluid collection.     ______________________________________________________________________  Discharge Instructions:   Activity Instructions     Activity as tolerated            Follow Up instructions and Outpatient Referrals     Call MD for:  difficulty breathing, headache or visual disturbances      Call MD for:  persistent nausea or vomiting      Call MD for:  severe uncontrolled pain      Call MD for:  temperature >38.5 Celsius      Discharge instructions          Appointments which have been scheduled for you    Jul 13, 2021  9:00 AM  (Arrive by 8:30 AM)  BELATACEPT 120 with [employer] INFUSION CHAIR 4  [facility] TRANSPLANT SURGERY [facility] REGION) [address]  [place] [address]  [phone]      Aug 07, 2021  9:30 AM  (Arrive by 9:00 AM)  BELATACEPT 120 with [employer] INFUSION CHAIR 4  [facility] TRANSPLANT SURGERY [facility] REGION) [address]  [place] [address]  [phone]      Oct 03, 2021  9:00 AM  (Arrive by 8:45 AM)  NEW  HEMATOLOGY BENIGN with Archie Endo, MD  [facility] BENIGN HEMATOLOGY CLINIC [place] [facility] REGION) [address]  [name] [address]  [phone]      Nov 08, 2021  8:00 AM  (Arrive by 7:45 AM)  NEW  GENERAL with Nurum Joeseph Amor, MD  [facility] INTERNAL MEDICINE [place] [facility] REGION) [address]  [address]  [place] [address]  [phone]           ______________________________________________________________________  Discharge Day Services:  BP 136/93  - Pulse 68  - Temp 36.3 ??C (97.3 ??F)  - Resp 16  - Ht 182.9 cm (6')  - Wt 82.6 kg (182 lb)  - SpO2 98%  - BMI 24.68 kg/m??     Pt seen on the day of discharge and determined appropriate for discharge.    Condition at Discharge: good    Length of Discharge: I spent greater than 30 mins in the discharge of this patient.

## 2021-07-12 LAB — HLA DS POST TRANSPLANT
ANTI-DONOR DRW #1 MFI: 0 MFI
ANTI-DONOR DRW #2 MFI: 0 MFI
ANTI-DONOR HLA-A #1 MFI: 0 MFI
ANTI-DONOR HLA-A #2 MFI: 62 MFI
ANTI-DONOR HLA-B #1 MFI: 0 MFI
ANTI-DONOR HLA-B #2 MFI: 20 MFI
ANTI-DONOR HLA-C #2 MFI: 28 MFI
ANTI-DONOR HLA-DQB #1 MFI: 0 MFI
ANTI-DONOR HLA-DQB #2 MFI: 5 MFI
ANTI-DONOR HLA-DR #1 MFI: 30 MFI
ANTI-DONOR HLA-DR #2 MFI: 0 MFI

## 2021-07-12 LAB — FSAB CLASS 2 ANTIBODY SPECIFICITY: HLA CL2 AB RESULT: NEGATIVE

## 2021-07-12 LAB — FSAB CLASS 1 ANTIBODY SPECIFICITY: HLA CLASS 1 ANTIBODY RESULT: NEGATIVE

## 2021-07-13 ENCOUNTER — Ambulatory Visit: Admit: 2021-07-13 | Discharge: 2021-07-14 | Payer: PRIVATE HEALTH INSURANCE

## 2021-07-13 LAB — BASIC METABOLIC PANEL
ANION GAP: 10 mmol/L (ref 5–14)
BLOOD UREA NITROGEN: 20 mg/dL (ref 9–23)
BUN / CREAT RATIO: 11
CALCIUM: 9.3 mg/dL (ref 8.7–10.4)
CHLORIDE: 106 mmol/L (ref 98–107)
CO2: 24 mmol/L (ref 20.0–31.0)
CREATININE: 1.74 mg/dL — ABNORMAL HIGH
EGFR CKD-EPI (2021) MALE: 51 mL/min/{1.73_m2} — ABNORMAL LOW (ref >=60)
GLUCOSE RANDOM: 98 mg/dL (ref 70–99)
POTASSIUM: 4 mmol/L (ref 3.4–4.8)
SODIUM: 140 mmol/L (ref 135–145)

## 2021-07-13 LAB — CBC W/ AUTO DIFF
BASOPHILS ABSOLUTE COUNT: 0 10*9/L (ref 0.0–0.1)
BASOPHILS RELATIVE PERCENT: 0.8 %
EOSINOPHILS ABSOLUTE COUNT: 0.1 10*9/L (ref 0.0–0.5)
EOSINOPHILS RELATIVE PERCENT: 3.7 %
HEMATOCRIT: 34.9 % — ABNORMAL LOW (ref 39.0–48.0)
HEMOGLOBIN: 11.8 g/dL — ABNORMAL LOW (ref 12.9–16.5)
LYMPHOCYTES ABSOLUTE COUNT: 0.8 10*9/L — ABNORMAL LOW (ref 1.1–3.6)
LYMPHOCYTES RELATIVE PERCENT: 38.5 %
MEAN CORPUSCULAR HEMOGLOBIN CONC: 33.9 g/dL (ref 32.0–36.0)
MEAN CORPUSCULAR HEMOGLOBIN: 30.8 pg (ref 25.9–32.4)
MEAN CORPUSCULAR VOLUME: 90.9 fL (ref 77.6–95.7)
MEAN PLATELET VOLUME: 7.4 fL (ref 6.8–10.7)
MONOCYTES ABSOLUTE COUNT: 0.3 10*9/L (ref 0.3–0.8)
MONOCYTES RELATIVE PERCENT: 17.2 %
NEUTROPHILS ABSOLUTE COUNT: 0.8 10*9/L — ABNORMAL LOW (ref 1.8–7.8)
NEUTROPHILS RELATIVE PERCENT: 39.8 %
PLATELET COUNT: 195 10*9/L (ref 150–450)
RED BLOOD CELL COUNT: 3.84 10*12/L — ABNORMAL LOW (ref 4.26–5.60)
RED CELL DISTRIBUTION WIDTH: 14.2 % (ref 12.2–15.2)
WBC ADJUSTED: 2 10*9/L — ABNORMAL LOW (ref 3.6–11.2)

## 2021-07-13 LAB — SLIDE REVIEW

## 2021-07-13 LAB — PHOSPHORUS: PHOSPHORUS: 3.1 mg/dL (ref 2.4–5.1)

## 2021-07-13 LAB — MAGNESIUM: MAGNESIUM: 1.8 mg/dL (ref 1.6–2.6)

## 2021-07-13 MED ADMIN — belatacept (NULOJIX) 412.5 mg in sodium chloride (NS) 0.9 % 100 mL IVPB: 5 mg/kg | INTRAVENOUS | @ 14:00:00 | Stop: 2021-07-13

## 2021-07-13 NOTE — Unmapped (Signed)
9147 Patient arrived to the Transplant Infusion Room today for belatacept 412.5mg , Condition: well; Mobility: ambulating; accompanied by self.   See Flowsheet and MAR for all details of visit.   0856 VS stable.  0910 PIV placed and secured with coban; labs collected and sent, urine collected and sent.  0912 Infusion initiated. Patient educated to notify nursing staff if they experience urticaria, erythema, nausea, shortness of breath, nausea, metal taste in mouth, excessive oral or postnasal drainage and any other changes in status which could be side effects from initiating this medication.  8295 Infusion complete.   6213 VS stable, Line flushed with NS, PIV removed and secured with coban. Patient left clinic today, Condition: well; Mobility: ambulating; accompanied by self.

## 2021-07-14 NOTE — Unmapped (Signed)
Complex Case Management  SUMMARY NOTE    Complex Case Manager spoke with patient and verified correct patient using two identifiers today to introduce the Complex Case Management program.     Discussed the following:  Program Services    Program status: Interested    Discuss at next visit: Complete Pre-Assessment and Complete Initial Assessment    Scheduled for: 07/17/2021 @ 1:30pm with Complex Case Manager Gatha Mayer, LCSW

## 2021-07-17 NOTE — Unmapped (Signed)
Complex Case Management  SUMMARY NOTE    Complex Case Manager spoke with patient and verified correct patient using two identifiers today to introduce the Complex Case Management program.     Discussed the following:  Program Services - Pt voiced that he needed to reschedule Pre-initial assessment.  CM rescheduled for Friday 07/21/2021 @ 1pm    Program status: Interested    Discuss at next visit: Complete Pre-Assessment and Complete Initial Assessment    Scheduled for: 07/21/2021 @ 1pm with Complex Case Manager Gatha Mayer, LCSW

## 2021-07-20 NOTE — Unmapped (Signed)
[facility] Refill Coordination Note    Specialty Medication(s) to be Shipped:   Transplant: Myfortic 180mg - Patient declined due to being on med hold for a few months    Other medication(s) to be shipped: amlopidine, aspirin, losartan, omeprazole and prednisone     Ryan Moon, DOB: [birth year]  Phone: [phone] (home)       All above HIPAA information was verified with patient.     Was a Nurse, learning disability used for this call? No    Completed refill call assessment today to schedule patient's medication shipment from the [facility] [phone]).  All relevant notes have been reviewed.     Specialty medication(s) and dose(s) confirmed: Regimen is correct and unchanged.   Changes to medications: Raymund reports no changes at this time.  Changes to insurance: No  New side effects reported not previously addressed with a pharmacist or physician: None reported  Questions for the pharmacist: No    Confirmed patient received a Conservation officer, historic buildings and a Surveyor, mining with first shipment. The patient will receive a drug information handout for each medication shipped and additional FDA Medication Guides as required.       DISEASE/MEDICATION-SPECIFIC INFORMATION        N/A    SPECIALTY MEDICATION ADHERENCE              Were doses missed due to medication being on hold? No         REFERRAL TO PHARMACIST     Referral to the pharmacist: Not needed      [facility]     Shipping address confirmed in Epic.     Delivery Scheduled: Yes, Expected medication delivery date: 07/26/21.     Medication will be delivered via UPS to the prescription address in Epic WAM.    Unk Lightning   [facility] Specialty Technician

## 2021-07-21 NOTE — Unmapped (Signed)
Complex Case Management  SUMMARY NOTE    Attempted to contact pt today at Cell number to complete initial assessment. Left message to return call.; 1st attempt    Discuss at next visit: Complete Pre-Assessment and Complete Initial Assessment

## 2021-07-23 NOTE — Unmapped (Signed)
Patient called reporting he recently left the hospital 07/07/21 after several day admission for RLQ pain at transplanted kidney site, hematuria, rectal bleeding (attributed to hemorrhoids), and eye swelling.  Reports that symptoms he was previously seen for have improved and he is overall feeling well with regards to his transplant history and recent hospitalization.      He calls to report he has been having symptoms of severe depression and anxiety since leaving the hospital.  States that his PCP recently stopped refilling his alprazolam prescription and since running out of the medication, symptoms of panic, memory loss, chest tightness/heaviness, and difficulty breathing have worsened significantly.  He reports that his mind has just not been right over the last week and that he has been having what he described as convulsions when he shakes uncontrollably.      While we were on the phone, he started to gurgle and was unable to speak.  His wife Fleet Contras got on the phone and I recommended they call 911.  He then recovered and stated he preferred to have his family bring him to [facility] emergency department.  When we hung up, the patient's mother was on her way to pick him up and bring him to the ED (wife is at home with sick children).      ED charge RN Meg made aware.  Primary TNC notified.

## 2021-07-24 NOTE — Unmapped (Signed)
Called patient to see how he is feeling.  Patient reports he has been being weaned from his Xanax prescription by his PCP.  He reports needing more than the weaned dose and ran out of medication early.  He reports after stopping medication, he noted to have 2 seizures over the weekend.  He states he got Xanax from a friend of his and the physical discomfort subsided.  He states he filled his prescription today for the Xanax from his PCP.  He states he is in the process of switching PCP's due to insurance and is scheduled to see his new PCP in June.      Coordinator offered support and community resources for patient such as psychiatry or counseling due to patient's report of panic attacks and depression.  Patient declined at this time and states he would rather deal with a PCP directly.

## 2021-07-26 MED FILL — ASPIRIN 81 MG TABLET,DELAYED RELEASE: ORAL | 30 days supply | Qty: 30 | Fill #4

## 2021-07-26 MED FILL — LOSARTAN 50 MG TABLET: ORAL | 30 days supply | Qty: 60 | Fill #2

## 2021-07-26 MED FILL — PREDNISONE 5 MG TABLET: ORAL | 30 days supply | Qty: 30 | Fill #2

## 2021-07-26 MED FILL — AMLODIPINE 5 MG TABLET: ORAL | 30 days supply | Qty: 30 | Fill #2

## 2021-07-26 MED FILL — OMEPRAZOLE 20 MG CAPSULE,DELAYED RELEASE: ORAL | 30 days supply | Qty: 30 | Fill #11

## 2021-08-02 DIAGNOSIS — Z940 Kidney transplant status: Principal | ICD-10-CM

## 2021-08-02 DIAGNOSIS — D72819 Decreased white blood cell count, unspecified: Principal | ICD-10-CM

## 2021-08-02 DIAGNOSIS — D849 Immunodeficiency, unspecified: Principal | ICD-10-CM

## 2021-08-07 ENCOUNTER — Ambulatory Visit: Admit: 2021-08-07 | Discharge: 2021-08-08 | Payer: PRIVATE HEALTH INSURANCE

## 2021-08-07 DIAGNOSIS — B9689 Other specified bacterial agents as the cause of diseases classified elsewhere: Principal | ICD-10-CM

## 2021-08-07 DIAGNOSIS — L089 Local infection of the skin and subcutaneous tissue, unspecified: Principal | ICD-10-CM

## 2021-08-07 LAB — CBC W/ AUTO DIFF
BASOPHILS ABSOLUTE COUNT: 0 10*9/L (ref 0.0–0.1)
BASOPHILS RELATIVE PERCENT: 0.9 %
EOSINOPHILS ABSOLUTE COUNT: 0.1 10*9/L (ref 0.0–0.5)
EOSINOPHILS RELATIVE PERCENT: 5.3 %
HEMATOCRIT: 36.5 % — ABNORMAL LOW (ref 39.0–48.0)
HEMOGLOBIN: 13.1 g/dL (ref 12.9–16.5)
LYMPHOCYTES ABSOLUTE COUNT: 1 10*9/L — ABNORMAL LOW (ref 1.1–3.6)
LYMPHOCYTES RELATIVE PERCENT: 39.4 %
MEAN CORPUSCULAR HEMOGLOBIN CONC: 36 g/dL (ref 32.0–36.0)
MEAN CORPUSCULAR HEMOGLOBIN: 32.3 pg (ref 25.9–32.4)
MEAN CORPUSCULAR VOLUME: 89.9 fL (ref 77.6–95.7)
MEAN PLATELET VOLUME: 7.4 fL (ref 6.8–10.7)
MONOCYTES ABSOLUTE COUNT: 0.5 10*9/L (ref 0.3–0.8)
MONOCYTES RELATIVE PERCENT: 17.9 %
NEUTROPHILS ABSOLUTE COUNT: 1 10*9/L — ABNORMAL LOW (ref 1.8–7.8)
NEUTROPHILS RELATIVE PERCENT: 36.5 %
PLATELET COUNT: 185 10*9/L (ref 150–450)
RED BLOOD CELL COUNT: 4.06 10*12/L — ABNORMAL LOW (ref 4.26–5.60)
RED CELL DISTRIBUTION WIDTH: 14.7 % (ref 12.2–15.2)
WBC ADJUSTED: 2.6 10*9/L — ABNORMAL LOW (ref 3.6–11.2)

## 2021-08-07 LAB — PHOSPHORUS: PHOSPHORUS: 3.3 mg/dL (ref 2.4–5.1)

## 2021-08-07 LAB — BASIC METABOLIC PANEL
ANION GAP: 11 mmol/L (ref 5–14)
BLOOD UREA NITROGEN: 20 mg/dL (ref 9–23)
BUN / CREAT RATIO: 12
CALCIUM: 9.7 mg/dL (ref 8.7–10.4)
CHLORIDE: 108 mmol/L — ABNORMAL HIGH (ref 98–107)
CO2: 22 mmol/L (ref 20.0–31.0)
CREATININE: 1.61 mg/dL — ABNORMAL HIGH
EGFR CKD-EPI (2021) MALE: 56 mL/min/{1.73_m2} — ABNORMAL LOW (ref >=60)
GLUCOSE RANDOM: 99 mg/dL (ref 70–179)
POTASSIUM: 3.9 mmol/L (ref 3.4–4.8)
SODIUM: 141 mmol/L (ref 135–145)

## 2021-08-07 LAB — SLIDE REVIEW

## 2021-08-07 LAB — MAGNESIUM: MAGNESIUM: 1.9 mg/dL (ref 1.6–2.6)

## 2021-08-07 MED ORDER — DOXYCYCLINE HYCLATE 100 MG TABLET
ORAL_TABLET | Freq: Two times a day (BID) | ORAL | 0 refills | 7 days | Status: CP
Start: 2021-08-07 — End: 2021-08-14

## 2021-08-07 MED ADMIN — belatacept (NULOJIX) 412.5 mg in sodium chloride (NS) 0.9 % 100 mL IVPB: 5 mg/kg | INTRAVENOUS | @ 14:00:00 | Stop: 2021-08-07

## 2021-08-07 NOTE — Unmapped (Signed)
4132 Patient arrived to the Transplant Infusion Room today for belatacept 412.5mg , Condition: well; Mobility: ambulating; accompanied by self.   See Flowsheet and MAR for all details of visit.   0844 VS stable.  0900 PIV placed and secured with coban; labs collected and sent, urine no orders found.  4401 Infusion initiated. Patient educated to notify nursing staff if they experience urticaria, erythema, nausea, shortness of breath, nausea, metal taste in mouth, excessive oral or postnasal drainage and any other changes in status which could be side effects from initiating this medication.  0272 Infusion complete.   1015 VS stable, Line flushed with NS, PIV removed and secured with coban. Patient left clinic today, Condition: well; Mobility: ambulating; accompanied by self.

## 2021-08-16 MED ORDER — OMEPRAZOLE 20 MG CAPSULE,DELAYED RELEASE
ORAL_CAPSULE | Freq: Every day | ORAL | 11 refills | 30 days | Status: CP
Start: 2021-08-16 — End: 2022-08-16
  Filled 2021-08-23: qty 30, 30d supply, fill #0

## 2021-08-17 ENCOUNTER — Ambulatory Visit: Admit: 2021-08-17 | Discharge: 2021-08-18 | Payer: PRIVATE HEALTH INSURANCE

## 2021-08-17 LAB — BASIC METABOLIC PANEL
ANION GAP: 8 mmol/L (ref 5–14)
BLOOD UREA NITROGEN: 19 mg/dL (ref 9–23)
BUN / CREAT RATIO: 11
CALCIUM: 9.3 mg/dL (ref 8.7–10.4)
CHLORIDE: 106 mmol/L (ref 98–107)
CO2: 25.4 mmol/L (ref 20.0–31.0)
CREATININE: 1.68 mg/dL — ABNORMAL HIGH
EGFR CKD-EPI (2021) MALE: 53 mL/min/{1.73_m2} — ABNORMAL LOW (ref >=60)
GLUCOSE RANDOM: 96 mg/dL (ref 70–179)
POTASSIUM: 4.4 mmol/L (ref 3.4–4.8)
SODIUM: 139 mmol/L (ref 135–145)

## 2021-08-17 LAB — CBC W/ AUTO DIFF
BASOPHILS ABSOLUTE COUNT: 0 10*9/L (ref 0.0–0.1)
BASOPHILS RELATIVE PERCENT: 0.9 %
EOSINOPHILS ABSOLUTE COUNT: 0.1 10*9/L (ref 0.0–0.5)
EOSINOPHILS RELATIVE PERCENT: 4.2 %
HEMATOCRIT: 35.7 % — ABNORMAL LOW (ref 39.0–48.0)
HEMOGLOBIN: 12.3 g/dL — ABNORMAL LOW (ref 12.9–16.5)
LYMPHOCYTES ABSOLUTE COUNT: 0.9 10*9/L — ABNORMAL LOW (ref 1.1–3.6)
LYMPHOCYTES RELATIVE PERCENT: 45.1 %
MEAN CORPUSCULAR HEMOGLOBIN CONC: 34.4 g/dL (ref 32.0–36.0)
MEAN CORPUSCULAR HEMOGLOBIN: 31.8 pg (ref 25.9–32.4)
MEAN CORPUSCULAR VOLUME: 92.6 fL (ref 77.6–95.7)
MEAN PLATELET VOLUME: 7.3 fL (ref 6.8–10.7)
MONOCYTES ABSOLUTE COUNT: 0.3 10*9/L (ref 0.3–0.8)
MONOCYTES RELATIVE PERCENT: 15.5 %
NEUTROPHILS ABSOLUTE COUNT: 0.7 10*9/L — ABNORMAL LOW (ref 1.8–7.8)
NEUTROPHILS RELATIVE PERCENT: 34.3 %
NUCLEATED RED BLOOD CELLS: 0 /100{WBCs} (ref <=4)
PLATELET COUNT: 208 10*9/L (ref 150–450)
RED BLOOD CELL COUNT: 3.85 10*12/L — ABNORMAL LOW (ref 4.26–5.60)
RED CELL DISTRIBUTION WIDTH: 14.5 % (ref 12.2–15.2)
WBC ADJUSTED: 1.9 10*9/L — ABNORMAL LOW (ref 3.6–11.2)

## 2021-08-17 LAB — SLIDE REVIEW

## 2021-08-17 LAB — PHOSPHORUS: PHOSPHORUS: 3.7 mg/dL (ref 2.4–5.1)

## 2021-08-17 LAB — MAGNESIUM: MAGNESIUM: 1.8 mg/dL (ref 1.6–2.6)

## 2021-08-23 MED FILL — MG-PLUS-PROTEIN 133 MG TABLET: ORAL | 100 days supply | Qty: 100 | Fill #3

## 2021-08-23 MED FILL — AMLODIPINE 5 MG TABLET: ORAL | 30 days supply | Qty: 30 | Fill #3

## 2021-08-23 MED FILL — ASPIRIN 81 MG TABLET,DELAYED RELEASE: ORAL | 30 days supply | Qty: 30 | Fill #5

## 2021-08-23 MED FILL — PREDNISONE 5 MG TABLET: ORAL | 30 days supply | Qty: 30 | Fill #3

## 2021-08-23 MED FILL — LOSARTAN 50 MG TABLET: ORAL | 30 days supply | Qty: 60 | Fill #3

## 2021-09-06 ENCOUNTER — Ambulatory Visit: Admit: 2021-09-06 | Discharge: 2021-09-07 | Payer: PRIVATE HEALTH INSURANCE

## 2021-09-06 LAB — SLIDE REVIEW

## 2021-09-06 LAB — CBC W/ AUTO DIFF
BASOPHILS ABSOLUTE COUNT: 0 10*9/L (ref 0.0–0.1)
BASOPHILS RELATIVE PERCENT: 0.9 %
EOSINOPHILS ABSOLUTE COUNT: 0.1 10*9/L (ref 0.0–0.5)
EOSINOPHILS RELATIVE PERCENT: 4.4 %
HEMATOCRIT: 35.1 % — ABNORMAL LOW (ref 39.0–48.0)
HEMOGLOBIN: 12.5 g/dL — ABNORMAL LOW (ref 12.9–16.5)
LYMPHOCYTES ABSOLUTE COUNT: 0.9 10*9/L — ABNORMAL LOW (ref 1.1–3.6)
LYMPHOCYTES RELATIVE PERCENT: 51.6 %
MEAN CORPUSCULAR HEMOGLOBIN CONC: 35.6 g/dL (ref 32.0–36.0)
MEAN CORPUSCULAR HEMOGLOBIN: 32.3 pg (ref 25.9–32.4)
MEAN CORPUSCULAR VOLUME: 90.7 fL (ref 77.6–95.7)
MEAN PLATELET VOLUME: 7.5 fL (ref 6.8–10.7)
MONOCYTES ABSOLUTE COUNT: 0.3 10*9/L (ref 0.3–0.8)
MONOCYTES RELATIVE PERCENT: 14.4 %
NEUTROPHILS ABSOLUTE COUNT: 0.5 10*9/L — ABNORMAL LOW (ref 1.8–7.8)
NEUTROPHILS RELATIVE PERCENT: 28.7 %
PLATELET COUNT: 179 10*9/L (ref 150–450)
RED BLOOD CELL COUNT: 3.87 10*12/L — ABNORMAL LOW (ref 4.26–5.60)
RED CELL DISTRIBUTION WIDTH: 14.1 % (ref 12.2–15.2)
WBC ADJUSTED: 1.8 10*9/L — ABNORMAL LOW (ref 3.6–11.2)

## 2021-09-06 LAB — RENAL FUNCTION PANEL
ALBUMIN: 4.4 g/dL (ref 3.4–5.0)
ANION GAP: 10 mmol/L (ref 5–14)
BLOOD UREA NITROGEN: 16 mg/dL (ref 9–23)
BUN / CREAT RATIO: 10
CALCIUM: 9 mg/dL (ref 8.7–10.4)
CHLORIDE: 105 mmol/L (ref 98–107)
CO2: 25 mmol/L (ref 20.0–31.0)
CREATININE: 1.67 mg/dL — ABNORMAL HIGH
EGFR CKD-EPI (2021) MALE: 54 mL/min/{1.73_m2} — ABNORMAL LOW (ref >=60)
GLUCOSE RANDOM: 107 mg/dL (ref 70–179)
PHOSPHORUS: 3.1 mg/dL (ref 2.4–5.1)
POTASSIUM: 3.5 mmol/L (ref 3.4–4.8)
SODIUM: 140 mmol/L (ref 135–145)

## 2021-09-06 LAB — MAGNESIUM: MAGNESIUM: 2 mg/dL (ref 1.6–2.6)

## 2021-09-06 MED ADMIN — belatacept (NULOJIX) 412.5 mg in sodium chloride (NS) 0.9 % 100 mL IVPB: 5 mg/kg | INTRAVENOUS | @ 13:00:00 | Stop: 2021-09-06

## 2021-09-06 NOTE — Unmapped (Signed)
1610 Patient arrived to the Transplant Infusion Room today for belatacept 412.5 mg, Condition: well but exhausted from working to much; Mobility: ambulating; accompanied by self.   See Flowsheet and MAR for all details of visit.   0845 VS stable.  9604 PIV placed and secured with coban; labs collected and sent, urine no orders found.  0855 Infusion initiated. Patient educated to notify nursing staff if they experience urticaria, erythema, nausea, shortness of breath, nausea, metal taste in mouth, excessive oral or postnasal drainage and any other changes in status which could be side effects from initiating this medication.  5409 Infusion complete.   8119 VS stable, Line flushed with NS, PIV removed and secured with coban. Patient left clinic today, Condition: well; Mobility: ambulating; accompanied by self.

## 2021-09-11 NOTE — Unmapped (Signed)
[facility] Refill Coordination Note    Specialty Medication(s) to be Shipped:   Transplant: Prednisone 5mg     Other medication(s) to be shipped: amlodipine, aspirin, losartan and omeprazole     Ryan Moon, DOB: 1986/03/21  Phone: [phone] (home)       All above HIPAA information was verified with patient.     Was a Nurse, learning disability used for this call? No    Completed refill call assessment today to schedule patient's medication shipment from the [facility] [phone]).  All relevant notes have been reviewed.     Specialty medication(s) and dose(s) confirmed: Regimen is correct and unchanged.   Changes to medications: Ryan Moon reports no changes at this time.  Changes to insurance: No  New side effects reported not previously addressed with a pharmacist or physician: None reported  Questions for the pharmacist: No    Confirmed patient received a Conservation officer, historic buildings and a Surveyor, mining with first shipment. The patient will receive a drug information handout for each medication shipped and additional FDA Medication Guides as required.       DISEASE/MEDICATION-SPECIFIC INFORMATION        N/A    SPECIALTY MEDICATION ADHERENCE     Medication Adherence    Patient reported X missed doses in the last month: 0  Specialty Medication: Prednisone 5mg   Patient is on additional specialty medications: No        Were doses missed due to medication being on hold? No    Prednisone 5 mg: 12 days of medicine on hand     REFERRAL TO PHARMACIST     Referral to the pharmacist: Not needed      [facility]     Shipping address confirmed in Epic.     Delivery Scheduled: Yes, Expected medication delivery date: 09/21/2021.     Medication will be delivered via UPS to the prescription address in Epic WAM.    Ryan Moon [facility] Specialty Technician

## 2021-09-21 MED FILL — ASPIRIN 81 MG TABLET,DELAYED RELEASE: ORAL | 30 days supply | Qty: 30 | Fill #6

## 2021-09-21 MED FILL — LOSARTAN 50 MG TABLET: ORAL | 30 days supply | Qty: 60 | Fill #4

## 2021-09-21 MED FILL — PREDNISONE 5 MG TABLET: ORAL | 30 days supply | Qty: 30 | Fill #4

## 2021-09-21 MED FILL — AMLODIPINE 5 MG TABLET: ORAL | 30 days supply | Qty: 30 | Fill #4

## 2021-09-21 MED FILL — OMEPRAZOLE 20 MG CAPSULE,DELAYED RELEASE: ORAL | 30 days supply | Qty: 30 | Fill #1

## 2021-09-21 NOTE — Unmapped (Signed)
Spoke with patient who states he thinks he was bitten by a spider a few days ago.  He states that it is not an open wound, but is red and on his right chest.  He notes that it is sore when his shirt touches it.  Coordinator advised patient to monitor overnight and if no improvement the wound needs to be evaluated at an urgent care tomorrow.   Coordinator that patient could also trace a line around wound edge to note if redness is spreading and monitor for signs of cellulitis.  If spreading redness tomorrow, patient would probably need antibiotics.   Patient verbalized understanding.

## 2021-10-02 DIAGNOSIS — D72819 Decreased white blood cell count, unspecified: Principal | ICD-10-CM

## 2021-10-02 DIAGNOSIS — Z940 Kidney transplant status: Principal | ICD-10-CM

## 2021-10-02 DIAGNOSIS — D849 Immunodeficiency, unspecified: Principal | ICD-10-CM

## 2021-10-02 NOTE — Unmapped (Addendum)
Hematology Consult Note    Referring Physician :  Griffin Dakin, *    Primary Care Physician:   Hassell Halim, MD    Reason for Consult:  Neutropenia    Assessment/Recommendations:   Ryan Moon is a 36 y.o. Caucasian male who we are seeing in consultation at the request of Griffin Dakin for further evaluation of neutropenia.    1.  Neutropenia:  New in onset since 08/2019, following LDKT in late 04/2019.  Has required Granix on 3 occasions, 08/2019 x 2 and 02/2021 x 1.  MMF was held during each of these occasions and has not been restarted since last discontinuation in 02/2021.  Since then, ANCs have been in the 0.5-1.3 range and no further G-CSF has been given.  There have been no infectious complications and no other significant cytopenias.    Overall, patient's neutropenia is likely related to immunosuppression: belatacept has a 20% incidence of leukopenia and mycophenolate (not currently on but 19% to 46%).  Recent worsening could be related to recent infection and/or treatment (although brief) with bactrim. We will also work up for additional secondary causes such as nutritional and clonal but suspect he will need adjustment of immunosuppression vs support with granix if that is not possible.       2. Mild Normocytic anemia: likely due to CKD. However will work up for alterate causes.      Plans:  1. Repeat CBC, smear, repeat viral studies, flow. If these are negative, we will order a myeloid mutational panel on his peripheral blood. If the myeloid panel is negative, although it is not absolute, there is likely no need for bone marrow biopsy given high likelihood this is from immunosuppressive medications.   2. EPO, iron panel, ferritin, B12, folate, copper, zinc, LDH, haptoglobin  3. Next transplant nephrology appt 5/3  4. Defer granix support to transplant, typically our goal would be ANC >[phone]      Rosine Door, Heme-Onc Fellow.   Seen and discussed with:  Laverta Baltimore, MD  ----------------------------------  I saw and evaluated the patient, participating in the key portions of the service.?? I reviewed the resident???s note.?? I agree with the resident???s findings and plan.   ??  ANC stable today at 1.0, and Hgb just within normal range.  Evaluation for secondary causes of both negative throughout including B12/folate deficiency, iron deficiency, copper deficiency, HIV, HCV (checked 06/2020) and hemolysis.  Heme path smear review and peripheral blood flow cytometry negative/normal.  Overall, suspect neutropenia is medication induced.  Discussed with patient the options of continued monitoring with immunosuppression modifications +/- G-CSF support vs BM biopsy vs MDS panel.  He does not wish to pursue BM biopsy, which I think is reasonable.  Given negative workup above, will send peripheral blood MDS panel.  If negative, then can continue to monitor through Renal Transplant team and give G-CSF PRN for ANC < [phone] depending on their protocol.  Would also use in setting of any active infection/fever to keep ANC > 1500.  No follow up required unless MDS panel returns positive.      Laverta Baltimore, MD    I spent 58 minutes on this visit including chart review and face to face time with patient.  ----------------------------------    History of Present Illness:   Ryan Moon is a 36 y.o. Caucasian male who we are seeing in consultation at the request of Dr. Toni Arthurs for further evaluation of neutropenia.  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  He was placed on PD by outside nephrology prior to establishing at [facility]. Their note reports that he was treated with rituximab and cyclophosphamide while on PD as well as Epogen. He reports doing well after his LDKT in 04/2019.  He first developed neutropenia in early 08/2019.  Tacrolimus dose was decreased and ultimately MMF held.  Required Granix 480 mcg x 2, and then again in late 08/2019.  Changed from Tacro to belatacept and MMF restarted at lower dose in mid 09/2019, which was quickly increased back to full dose.  ANC remained intermittently low, and MMF dose was reduced again in 12/2019.  ANC was relatively stable thereafter, with occasional mild neutropenia, until 11/2020.  ANC fell to nadir of 0.4 in 02/2021, prompting Granix x 1.  MMF was also held again and has not been restarted to date.  ANCs have been in the 0.5-1.3 range since that time without further G-CSF support.    Despite neutropenia issues above, patient has not had problems with serious or recurrent infections. He denies any weight loss, fevers, night sweats, adenopathy.  There have been no other concerning cytopenias apart from mild intermittent anemia likely related to renal insufficiency.        Visit Diagnoses:  1. Neutropenia, unspecified type (CMS-HCC)    2. Chronic anemia    3. Kidney transplant recipient        Medical History:  Past Medical History:   Diagnosis Date   ??? Renal vasculitis (CMS-HCC)    ??? Secondary hyperparathyroidism (CMS-HCC)        Surgical History:  Past Surgical History:   Procedure Laterality Date   ??? APPENDECTOMY  2013   ??? NEPHRECTOMY TRANSPLANTED ORGAN     ??? PR COLSC FLX W/RMVL OF TUMOR POLYP LESION SNARE TQ N/A 07/07/2021    Procedure: COLONOSCOPY FLEX; W/REMOV TUMOR/LES BY SNARE;  Surgeon: Maris Berger, MD;  [facility]: [facility];  Service: Gastroenterology   ??? PR TRANSPLANT,PREP CADAVER RENAL GRAFT Right 04/28/2019    Procedure: [facility] STD PREP CAD DONR RENAL ALLOGFT PRIOR TO TRNSPLNT, INCL DISSEC/REM PERINEPH FAT, DIAPH/RTPER ATTAC;  Surgeon: Doyce Loose, MD;  Location: MAIN OR [facility];  Service: Transplant   ??? PR TRANSPLANTATION OF KIDNEY Right 04/28/2019    Procedure: RENAL ALLOTRANSPLANTATION, IMPLANTATION OF GRAFT; WITHOUT RECIPIENT NEPHRECTOMY;  Surgeon: Doyce Loose, MD;  Location: MAIN OR [facility];  Service: Transplant       Medications:   Current Outpatient Medications   Medication Sig Dispense Refill   ??? ALPRAZolam (XANAX) 0.5 MG tablet Take 1 tablet (0.5 mg total) by mouth two (2) times a day as needed.     ??? amLODIPine (NORVASC) 5 MG tablet Take 1 tablet (5 mg total) by mouth every evening. 30 tablet 11   ??? aspirin (ECOTRIN) 81 MG tablet Take 1 tablet (81 mg total) by mouth daily. 30 tablet 11   ??? belatacept (NULOJIX) 250 mg SolR Belatacept  5 mg/kg every 2 weeks for 5 doses then once every 4 weeks 14.5 mL 0   ??? losartan (COZAAR) 50 MG tablet Take 1 tablet (50 mg total) by mouth Two (2) times a day. 60 tablet 11   ??? magnesium oxide-Mg AA chelate (MAGNESIUM, AMINO ACID CHELATE,) 133 mg Take 1 tablet by mouth daily. 100 tablet 11   ??? omeprazole (PRILOSEC) 20 MG capsule Take 1 capsule (20 mg total) by mouth  daily. 30 capsule 11   ??? predniSONE (DELTASONE) 5 MG tablet Take 1 tablet (5 mg total) by mouth daily. 30 tablet 11   ??? mycophenolate (MYFORTIC) 180 MG EC tablet Take 1 tablet (180 mg total) by mouth Two (2) times a day. (Patient not taking: Reported on 10/03/2021) 60 tablet 11     No current facility-administered medications for this visit.       Allergies:  Ibuprofen and Acetaminophen    Social History:  Social History     Socioeconomic History   ??? Marital status: Married     Spouse name: None   ??? Number of children: 2   ??? Years of education: None   ??? Highest education level: High school graduate   Tobacco Use   ??? Smoking status: Former     Types: Cigarettes     Quit date: 05/22/2018     Years since quitting: 3.3   ??? Smokeless tobacco: Former     Types: Chew   Substance and Sexual Activity   ??? Alcohol use: Not Currently     Comment: Patient had a DUI at age 41. Until 5/16 patient would have 1-2 beers per day.    ??? 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 Patient also reports having smoked  marijuana daily until 01/2018 Lives in [place], [place] with wife and 3 children.  Prior substance abuse as per above, but none recently.    Family History:    2 siblings, healthy.   3 kids: 23yrs , 10 months, 5 yrs  Works: Advice worker      Review of Systems:  As per HPI, otherwise negative x 12 systems.    Physical Exam:  BP 125/82 (BP Site: L Arm, BP Position: Sitting, BP Cuff Size: Medium)  - Pulse 63  - Temp 36.4 ??C (97.5 ??F) (Temporal)  - Ht 182.9 cm (6' 0.01)  - Wt 86.7 kg (191 lb 3.2 oz)  - SpO2 99%  - BMI 25.93 kg/m??     General Appearance:  Awake, alert, cooperative, well appearing, no distress   Head:  Normocephalic, without obvious abnormality, atraumatic   Eyes:  PERRL, no icterus or conjunctivitis   Lymph nodes: No cervical, supraclavicular, axillary or inguinal lymphadenopathy bilaterally   Throat: Mucous membranes moist, no ulcers or lesions, no palatal petechiae or wet purpura   Neck: Supple, no LAD or thyromegaly   Lungs:   Clear to auscultation bilaterally, no wheezes or rales, respirations unlabored   Heart:  Regular rate and rhythm, S1 and S2 normal, no murmurs, gallops, rubs   Abdomen: Soft, non-tender, non-distended, no masses, no organomegaly   Skin: No rashes or lesions, no petechiae or purpura   Extremities: No cyanosis or edema   Neurologic: Non-focal, gait normal, cranial nerves grossly intact   Psych: Normal affect, linear thought, no tangential speech         Test Results  Results for orders placed or performed in visit on 10/03/21   HIV Antigen/Antibody Combo   Result Value Ref Range    HIV Antigen/Antibody Combo Nonreactive Nonreactive   Haptoglobin   Result Value Ref Range    Haptoglobin 160 40 - 280 mg/dL   Lactate dehydrogenase   Result Value Ref Range    LDH 312 (H) 120 - 246 U/L   Vitamin B12 Level   Result  Value Ref Range    Vitamin B-12 931 (H) 211 - 911 pg/ml   Folate Level   Result Value Ref Range    Folate 11.3 >=5.4 ng/mL   Copper, Serum   Result Value Ref Range    Copper 95 73 - 129 mcg/dL   Sedimentation Rate   Result Value Ref Range    Sed Rate 16 (H) 0 - 15 mm/h   Reticulocytes   Result Value Ref Range    Reticulocyte Auto % 1.87 0.50 - 2.17 %    Absolute Auto Reticulocyte 76.2 23.0 - 100.0 10*9/L   Iron Panel   Result Value Ref Range    Iron 104 65 - 175 ug/dL    TIBC 403 474 - 259 ug/dL    Iron Saturation (%) 31 20 - 55 %   Ferritin   Result Value Ref Range    Ferritin 393.4 (H) 10.5 - 307.3 ng/mL   C-reactive protein   Result Value Ref Range    CRP <4.0 <=10.0 mg/L   Hematopathology Order   Result Value Ref Range    Diagnosis       Peripheral blood, smear review and flow cytometry  -  Mild normochromic normocytic anemia and leukopenia with no specific morphologic abnormalities (see Comment)  -  No monotypic B-cell, overtly aberrant T-cell, or expanded large granular lymphocyte population identified by flow cytometry      This electronic signature is attestation that the pathologist personally reviewed the submitted material(s) and the final diagnosis reflects that evaluation.      Diagnosis Comment       The etiology of the CBC findings may be multifactorial with kidney disease a contributing factor to the anemia and immunosuppression a contributing factor to the neutropenia and lymphopenia.  Laboratory studies to date do not support either a nutritional deficiency (copper level is pending) or anemia of chronic disease/inflammation. There is no morphologic evidence of hemolysis and there are no blasts or dysplastic leukocytes present as might be seen in a primary bone marrow neoplasm, such as an MDS. No evidence of a neoplastic process is identified by flow cytometric analysis.      Clinical History       The patient is a 36 year old male with a history of ANCA positive glomerulonephritis (sp LDKT in 2020), HTN, GERD, who is referred for evaluation of cytopenias.  His current neutropenia is likely related to immunosuppression and mild normocytic anemia is likely due to CKD. Pathologist's review of the peripheral blood smear and flow cytometry are requested.       Gross Description       Received: EDTA tube of peripheral blood with a request for flow cytometry. A blood smear is prepared and stained for review. Flow cytometry is performed.      Microscopic Description       Microscopic examination substantiates the above diagnosis.    Peripheral Blood:  Additionally, an albumin smear is prepared and reviewed.    Platelets: Adequate in number   Erythroid: Unremarkable; no significant anisopoikilocytosis   Leukocytes: Leukopenia with neutropenia and lymphopenia; negative for blasts, overtly dysplastic granulocytes, morphologically abnormal lymphoid cells, and circulating plasma cells         Resident Physician: None Assigned      EMBEDDED IMAGES      Disclaimer       Unless otherwise specified, specimens are preserved using 10% neutral buffered formalin. For cases in which immunohistochemical and/or in-situ hybridization stains are performed, the following  statement applies: Appropriate controls for each stain (positive controls with or without negative controls) have been evaluated and stain as expected. These stains have not been separately validated for use on decalcified specimens and should be interpreted with caution in that setting. Some of the reagents used for these stains may be classified as analyte specific reagents (ASR). Tests using ASRs were developed, and their performance characteristics were determined, by the Anatomic Pathology Department [facility]). They have not been cleared or approved by the [place] Food and Drug Administration (FDA). The FDA does not require these tests to go through premarket FDA review. These tests are used for clinical purposes. They should not be regarded as investigational or for research. This laboratory is certified under the Clinical Laboratory Improvement Amendments (CLIA) as qualified to perform high complexity clinical laboratory testing.      Flow Cytometry Summary       Flow Cytometric Immunophenotyping Results:    Peripheral blood  Heme WBC  2,400 cells/uL     Flow Differential (% of Total)  Viability:   97%  Total of Markers Charged        15  Markers-1 Charged  14    Gated Population:  30% Lymphocytes     Description of Gated Cells (% of Gated)     B-Cell Markers:    CD19   35%  CD20   36%  Kappa   22%  Lambda             13%    T-Cell Markers:    CD2   59%  CD3   53%  CD3/CD4  20%  Total CD4  21%  CD5   47%  CD7   56%  CD3/CD8  24%  Total CD8  27%    Miscellaneous:    CD10    1%  CD38    63%  CD45    100%  CD57    33%  CD3-/CD16+56  10%  Total CD16+56  14%    Dual Staining:     CD3+/CD57+  29%  CD8+/CD57+  19%           Flow Cytometry Interpretation:  Flow cytometric analysis of the peripheral blood reveals a decreased white blood cell count (2,400 cells/uL).    A population of 30% cells is gated on the lymphocyte region and is composed of 53% T-cells (CD4: CD8 ratio is 0.8:1) without an aberrant immunophenotype, 35% polytypic B-cells, and 10%  NK-cells.    Flow cytometric analysis fails to reveal a monotypic B-cell,  overtly aberrant T-cell, or expanded large granular lymphocyte population.    This test, utilizing analyte-specific reagents (ASR) was developed and its performance characteristics determined by the [facility].  It has not been cleared or approved by the U.S. Food and Drug Administration (FDA).  The FDA has determined that such clearance or approval is not necessary.  This test is used for clinical purposes.  It should not be regarded as investigational or for research.      CBC w/ Differential   Result Value Ref Range    WBC 2.4 (L) 3.6 - 11.2 10*9/L    RBC 4.06 (L) 4.26 - 5.60 10*12/L    HGB 13.0 12.9 - 16.5 g/dL    HCT 14.7 (L) 82.9 - 48.0 %    MCV 93.0 77.6 - 95.7 fL    MCH 32.1 25.9 - 32.4 pg    MCHC 34.5 32.0 - 36.0 g/dL  RDW 14.1 12.2 - 15.2 %    MPV 7.3 6.8 - 10.7 fL    Platelet 202 150 - 450 10*9/L    nRBC 0 <=4 /100 WBCs    Neutrophils % 42.6 %    Lymphocytes % 37.5 %    Monocytes % 15.8 %    Eosinophils % 3.7 %    Basophils % 0.4 %    Absolute Neutrophils 1.0 (L) 1.8 - 7.8 10*9/L    Absolute Lymphocytes 0.9 (L) 1.1 - 3.6 10*9/L    Absolute Monocytes 0.4 0.3 - 0.8 10*9/L    Absolute Eosinophils 0.1 0.0 - 0.5 10*9/L    Absolute Basophils 0.0 0.0 - 0.1 10*9/L   Morphology Review   Result Value Ref Range    Smear Review Comments See Comment (A) Undefined    Hypersegmented Neutrophils Present (A) Not Present   DNA Extract and Hold-Blood   Result Value Ref Range    Collection Collected

## 2021-10-03 ENCOUNTER — Ambulatory Visit
Admit: 2021-10-03 | Discharge: 2021-10-04 | Payer: PRIVATE HEALTH INSURANCE | Attending: Hematology | Primary: Hematology

## 2021-10-03 DIAGNOSIS — D709 Neutropenia, unspecified: Principal | ICD-10-CM

## 2021-10-03 DIAGNOSIS — Z940 Kidney transplant status: Principal | ICD-10-CM

## 2021-10-03 DIAGNOSIS — D649 Anemia, unspecified: Principal | ICD-10-CM

## 2021-10-03 LAB — FOLATE: FOLATE: 11.3 ng/mL (ref >=5.4)

## 2021-10-03 LAB — SLIDE REVIEW

## 2021-10-03 LAB — CBC W/ AUTO DIFF
BASOPHILS ABSOLUTE COUNT: 0 10*9/L (ref 0.0–0.1)
BASOPHILS RELATIVE PERCENT: 0.4 %
EOSINOPHILS ABSOLUTE COUNT: 0.1 10*9/L (ref 0.0–0.5)
EOSINOPHILS RELATIVE PERCENT: 3.7 %
HEMATOCRIT: 37.8 % — ABNORMAL LOW (ref 39.0–48.0)
HEMOGLOBIN: 13 g/dL (ref 12.9–16.5)
LYMPHOCYTES ABSOLUTE COUNT: 0.9 10*9/L — ABNORMAL LOW (ref 1.1–3.6)
LYMPHOCYTES RELATIVE PERCENT: 37.5 %
MEAN CORPUSCULAR HEMOGLOBIN CONC: 34.5 g/dL (ref 32.0–36.0)
MEAN CORPUSCULAR HEMOGLOBIN: 32.1 pg (ref 25.9–32.4)
MEAN CORPUSCULAR VOLUME: 93 fL (ref 77.6–95.7)
MEAN PLATELET VOLUME: 7.3 fL (ref 6.8–10.7)
MONOCYTES ABSOLUTE COUNT: 0.4 10*9/L (ref 0.3–0.8)
MONOCYTES RELATIVE PERCENT: 15.8 %
NEUTROPHILS ABSOLUTE COUNT: 1 10*9/L — ABNORMAL LOW (ref 1.8–7.8)
NEUTROPHILS RELATIVE PERCENT: 42.6 %
NUCLEATED RED BLOOD CELLS: 0 /100{WBCs} (ref <=4)
PLATELET COUNT: 202 10*9/L (ref 150–450)
RED BLOOD CELL COUNT: 4.06 10*12/L — ABNORMAL LOW (ref 4.26–5.60)
RED CELL DISTRIBUTION WIDTH: 14.1 % (ref 12.2–15.2)
WBC ADJUSTED: 2.4 10*9/L — ABNORMAL LOW (ref 3.6–11.2)

## 2021-10-03 LAB — RETICULOCYTES
RETICULOCYTE ABSOLUTE COUNT: 76.2 10*9/L (ref 23.0–100.0)
RETICULOCYTE COUNT PCT: 1.87 % (ref 0.50–2.17)

## 2021-10-03 LAB — SEDIMENTATION RATE: ERYTHROCYTE SEDIMENTATION RATE: 16 mm/h — ABNORMAL HIGH (ref 0–15)

## 2021-10-03 LAB — IRON PANEL
IRON SATURATION: 31 % (ref 20–55)
IRON: 104 ug/dL
TOTAL IRON BINDING CAPACITY: 331 ug/dL (ref 250–425)

## 2021-10-03 LAB — FERRITIN: FERRITIN: 393.4 ng/mL — ABNORMAL HIGH

## 2021-10-03 LAB — HAPTOGLOBIN: HAPTOGLOBIN: 160 mg/dL (ref 40–280)

## 2021-10-03 LAB — C-REACTIVE PROTEIN: C-REACTIVE PROTEIN: <4 mg/L (ref <=10.0)

## 2021-10-03 LAB — LACTATE DEHYDROGENASE: LACTATE DEHYDROGENASE: 312 U/L — ABNORMAL HIGH (ref 120–246)

## 2021-10-03 LAB — VITAMIN B12: VITAMIN B-12: 931 pg/mL — ABNORMAL HIGH (ref 211–911)

## 2021-10-04 ENCOUNTER — Ambulatory Visit: Admit: 2021-10-04 | Discharge: 2021-10-05 | Payer: PRIVATE HEALTH INSURANCE

## 2021-10-04 LAB — RENAL FUNCTION PANEL
ALBUMIN: 4.1 g/dL (ref 3.4–5.0)
ANION GAP: 6 mmol/L (ref 5–14)
BLOOD UREA NITROGEN: 23 mg/dL (ref 9–23)
BUN / CREAT RATIO: 14
CALCIUM: 9.6 mg/dL (ref 8.7–10.4)
CHLORIDE: 104 mmol/L (ref 98–107)
CO2: 26 mmol/L (ref 20.0–31.0)
CREATININE: 1.7 mg/dL — ABNORMAL HIGH
EGFR CKD-EPI (2021) MALE: 53 mL/min/{1.73_m2} — ABNORMAL LOW (ref >=60)
GLUCOSE RANDOM: 63 mg/dL — ABNORMAL LOW (ref 70–99)
PHOSPHORUS: 3.4 mg/dL (ref 2.4–5.1)
POTASSIUM: 3.7 mmol/L (ref 3.4–4.8)
SODIUM: 136 mmol/L (ref 135–145)

## 2021-10-04 LAB — MAGNESIUM: MAGNESIUM: 1.9 mg/dL (ref 1.6–2.6)

## 2021-10-04 MED ADMIN — belatacept (NULOJIX) 437.5 mg in sodium chloride (NS) 0.9 % 100 mL IVPB: 5 mg/kg | INTRAVENOUS | @ 13:00:00 | Stop: 2021-10-04

## 2021-10-04 NOTE — Unmapped (Signed)
8295 Patient arrived to the Transplant Infusion Room today for belatacept 437.5mg , Condition: well; Mobility: ambulating; accompanied by self.   See Flowsheet and MAR for all details of visit.   0859 VS stable.  6213 PIV placed and secured with coban; labs collected and sent, urine no orders found.  0912 Infusion initiated. Patient educated to notify nursing staff if they experience urticaria, erythema, nausea, shortness of breath, nausea, metal taste in mouth, excessive oral or postnasal drainage and any other changes in status which could be side effects from initiating this medication.  0945 Infusion complete.   1000 VS stable, Line flushed with NS, PIV removed and secured with coban. Patient left clinic today, Condition: well; Mobility: ambulating; accompanied by self.

## 2021-10-05 LAB — HIV ANTIGEN/ANTIBODY COMBO: HIV ANTIGEN/ANTIBODY COMBO: NONREACTIVE

## 2021-10-05 LAB — COPPER, SERUM: COPPER: 95 ug/dL

## 2021-10-07 LAB — METHYLMALONIC ACID, SERUM: METHYLMALONIC ACID: 0.17 nmol/mL

## 2021-10-10 NOTE — Unmapped (Signed)
Addended by: Irene Shipper on: 10/10/2021 12:48 PM     Modules accepted: Orders

## 2021-10-17 NOTE — Unmapped (Signed)
[facility] Refill Coordination Note    Specialty Medication(s) to be Shipped:   Transplant: Prednisone 5mg     Other medication(s) to be shipped: amlodipine, aspirin, losartan and omeprazole     Ryan Moon, DOB: 06-07-1985  Phone: [phone] (home)       All above HIPAA information was verified with patient.     Was a Nurse, learning disability used for this call? No    Completed refill call assessment today to schedule patient's medication shipment from the [facility] [phone]).  All relevant notes have been reviewed.     Specialty medication(s) and dose(s) confirmed: Regimen is correct and unchanged.   Changes to medications: Ryan Moon reports no changes at this time.  Changes to insurance: No  New side effects reported not previously addressed with a pharmacist or physician: None reported  Questions for the pharmacist: No    Confirmed patient received a Conservation officer, historic buildings and a Surveyor, mining with first shipment. The patient will receive a drug information handout for each medication shipped and additional FDA Medication Guides as required.       DISEASE/MEDICATION-SPECIFIC INFORMATION        N/A    SPECIALTY MEDICATION ADHERENCE     Medication Adherence    Patient reported X missed doses in the last month: 0  Specialty Medication: Prednisone 5mg   Patient is on additional specialty medications: No        Were doses missed due to medication being on hold? No    Prednisone 5 mg: 5 days of medicine on hand     REFERRAL TO PHARMACIST     Referral to the pharmacist: Not needed      [facility]     Shipping address confirmed in Epic.     Delivery Scheduled: Yes, Expected medication delivery date: 09/21/2021.     Medication will be delivered via UPS to the prescription address in Epic WAM.    [name] [facility] Specialty Technician

## 2021-10-20 NOTE — Unmapped (Signed)
[facility]  New visit  10/27/2021    Ryan Moon is a 36 y.o. male who was seen for follow-up of Hemorrhoids, unspecified hemorrhoid type [K64.9].     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     Patient underwent colonoscopy 2/3 and was found to have non-bleeding external and internal hemorrhoids, thought to be the etiology of his BRBPR. 4 sessile polyps were seen in the descending colon and were biopsied. Patient remained HDS and Hgb stable, no further BRBPR. Recommended maintaining soft regular bowel movements with high fiber diet, and use of miralax as needed.     - High fiber diet  - Fiber supplement and miralax as needed  - Repeat Colonoscopy for CRC Screening: 2024     Hemorrhoids, unspecified hemorrhoid type         Return if symptoms worsen or fail to improve.    Subjective   History of Present Illness     Initially seen by me 07/2021 inpatient for hematochezia (single episode BRBPR) and RLQ abdominal pain.    Patient underwent colonoscopy 2/3 and was found to have non-bleeding external and internal hemorrhoids, thought to be the etiology of his BRBPR. 4 sessile polyps were seen in the descending colon and were biopsied. Patient remained HDS and Hgb stable, no further BRBPR. Recommended maintaining soft regular bowel movements with high fiber diet, and use of miralax as needed.     Today:  Patient reports doing much better.  No further BRBPR.  No straining with bowel movements.  No new issues or concerns.     Objective   Physical Exam   Vital Signs: BP 121/81 (BP Site: L Arm, BP Position: Sitting, BP Cuff Size: Medium)  - Pulse 60  - Ht 182.9 cm (6')  - Wt 84.7 kg (186 lb 12.8 oz)  - BMI 25.33 kg/m??   Constitutional: He is in no apparent distress  Eyes: Anicteric sclerae  Cardiovascular: No peripheral edema  Gastrointestinal: Soft, nontender abdomen without hepatosplenomegaly, hernias or masses  Neurologic: Awake, alert, and oriented to person, place, and time with normal speech with no asterixis or tremor    Colonoscopy 07/2021:  Impression:              - Four 5 to 8 mm polyps in the descending colon, removed with a cold snare. Resected and retrieved.  - Inadequate prep for detecting polyps <59mm.  - Non-bleeding external and internal hemorrhoids,likely etiology of rectal bleeding    A: Colon, descending, polypectomy:  - Tubular adenoma(s) (multiple fragments)     :[id number]

## 2021-10-23 MED FILL — LOSARTAN 50 MG TABLET: ORAL | 30 days supply | Qty: 60 | Fill #5

## 2021-10-23 MED FILL — ASPIRIN 81 MG TABLET,DELAYED RELEASE: ORAL | 30 days supply | Qty: 30 | Fill #7

## 2021-10-23 MED FILL — OMEPRAZOLE 20 MG CAPSULE,DELAYED RELEASE: ORAL | 30 days supply | Qty: 30 | Fill #2

## 2021-10-23 MED FILL — PREDNISONE 5 MG TABLET: ORAL | 30 days supply | Qty: 30 | Fill #5

## 2021-10-23 MED FILL — AMLODIPINE 5 MG TABLET: ORAL | 30 days supply | Qty: 30 | Fill #5

## 2021-10-26 NOTE — Unmapped (Signed)
Pre called to triage prior to Fridays appointment with GI Medicine. No answer. Left message with call back instructions.

## 2021-10-27 ENCOUNTER — Ambulatory Visit
Admit: 2021-10-27 | Discharge: 2021-10-28 | Payer: PRIVATE HEALTH INSURANCE | Attending: Student in an Organized Health Care Education/Training Program | Primary: Student in an Organized Health Care Education/Training Program

## 2021-10-27 NOTE — Unmapped (Signed)
[facility] at [facility]  Phone: [phone]         After Visit Instructions:  -- It was a pleasure to see you in clinic today. I am glad you are doing much better.  -- If you notice straining you can use metamucil or Citrucel fiber supplements in addition to a high fiber diet.     -- You will need a colonoscopy in 2024 for follow up.     -- I will see you back in clinic in as needed.    Thank you for seeing [place] in clinic! If you do not have MyChart, please sign up at [email or web address] MyChart. MyChart will allow you to view the notes including next steps and test results. If you have questions before our next visit, please send me an electronic message though MyChart or call in at the number below. Please note electronic messaging is only for non-urgent questions and response times can vary (3-5 business days).    Geralyn Flash, MD  [facility] Gastroenterology and Hepatology Fellow    Important phone numbers:  Nurse Contact: Noe Gens, MSN, RN  p: [phone] - f: [phone]    GI Clinic Appointments:  [phone] option 1 then option 1  Please call the GI Clinic appointment line if you need to schedule, reschedule or cancel an appointment in clinic. They can also answer any questions you may have about where your appointment is located and when you need to arrive.      GI Procedure Appointments:  [phone] option 1 then option 2  Please call the GI Procedures line if you need to schedule, reschedule or cancel any type of GI procedure (endoscopy, colonoscopy, motility testing, etc).  You can also call this number for prep instructions, etc.      Radiology: [phone]  If you are being scheduled for any type of radiology study, you will need to call to receive your appointment time.  Please call this number for information.       For emergencies after normal business hours or on weekends/holidays   Proceed to the nearest emergency room OR contact the [facility] Operator at [phone] who can page the Gastroenterology Fellow on call.

## 2021-11-01 ENCOUNTER — Ambulatory Visit
Admit: 2021-11-01 | Discharge: 2021-11-02 | Payer: PRIVATE HEALTH INSURANCE | Attending: Geriatric Medicine | Primary: Geriatric Medicine

## 2021-11-01 DIAGNOSIS — G8929 Other chronic pain: Principal | ICD-10-CM

## 2021-11-01 DIAGNOSIS — M25512 Pain in left shoulder: Principal | ICD-10-CM

## 2021-11-01 DIAGNOSIS — M25551 Pain in right hip: Principal | ICD-10-CM

## 2021-11-01 DIAGNOSIS — M25511 Pain in right shoulder: Principal | ICD-10-CM

## 2021-11-01 DIAGNOSIS — M12812 Other specific arthropathies, not elsewhere classified, left shoulder: Principal | ICD-10-CM

## 2021-11-01 DIAGNOSIS — J3489 Other specified disorders of nose and nasal sinuses: Principal | ICD-10-CM

## 2021-11-01 DIAGNOSIS — M12811 Other specific arthropathies, not elsewhere classified, right shoulder: Principal | ICD-10-CM

## 2021-11-01 DIAGNOSIS — I10 Essential (primary) hypertension: Principal | ICD-10-CM

## 2021-11-01 DIAGNOSIS — Z940 Kidney transplant status: Principal | ICD-10-CM

## 2021-11-01 DIAGNOSIS — Z0000 Encounter for general adult medical examination without abnormal findings: Principal | ICD-10-CM

## 2021-11-01 DIAGNOSIS — M25552 Pain in left hip: Principal | ICD-10-CM

## 2021-11-01 DIAGNOSIS — K219 Gastro-esophageal reflux disease without esophagitis: Principal | ICD-10-CM

## 2021-11-01 DIAGNOSIS — F4323 Adjustment disorder with mixed anxiety and depressed mood: Principal | ICD-10-CM

## 2021-11-01 DIAGNOSIS — R7989 Other specified abnormal findings of blood chemistry: Principal | ICD-10-CM

## 2021-11-01 DIAGNOSIS — Z7185 Vaccine counseling: Principal | ICD-10-CM

## 2021-11-01 DIAGNOSIS — M7062 Trochanteric bursitis, left hip: Principal | ICD-10-CM

## 2021-11-01 DIAGNOSIS — M7061 Trochanteric bursitis, right hip: Principal | ICD-10-CM

## 2021-11-01 MED ORDER — ALPRAZOLAM 0.5 MG TABLET
ORAL_TABLET | Freq: Three times a day (TID) | ORAL | 0 refills | 4 days | Status: CP
Start: 2021-11-01 — End: ?

## 2021-11-01 NOTE — Unmapped (Signed)
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  Has required Granix on 3 occasions, 08/2019 x 2 and 02/2021 x 1.  MMF was held during each of these occasions and has not been restarted since last discontinuation in 02/2021.  Since then, ANCs have been in the 0.5-1.3 range and no further G-CSF has been given.  There have been no infectious complications and no other significant cytopenias.Overall, patient's neutropenia is likely related to immunosuppression: belatacept has a 20% incidence of leukopenia and mycophenolate (not currently on but 19% to 46%).  Recent worsening could be related to recent infection and/or treatment (although brief) with bactrim. We will also work up for additional secondary causes such as nutritional and clonal but suspect he will need adjustment of immunosuppression vs support with granix if that is not possible.        Mild Normocytic anemia: likely due to CKD. However will work up for alterate causes.    Secondary hyperparathyroidism    History of anxiety/depression: Managed with Lexapro and buspirone..  Primary care doctor was working on tapering alprazolam.    Sleep apnea avoid anything that may cause harm to his kidney transplant.      Trochanteric bursitis of both hips  Assessment & Plan:  Refer to orthopedics for injection.      Persistent adjustment disorder with mixed anxiety and depressed mood  Assessment & Plan:  It sounds like patient's symptoms with his mood started after he developed kidney failure and also complications with his kidney transplant.  Although his health is stable right now his symptoms do remain.  He has tapered down on alprazolam and only takes 2-3 times per week.  Previously he was taking it every day as prevention.  Patient says he did not tolerate SSRI with buspirone.  He said it made him feel very sluggish and down.  I am okay prescribing a limited number of alprazolam per month #10 with refills as needed by request.  Discussed possibility of counseling which patient had during his transplantation.  He feels like he would not benefit from more.  Consider trial of a different SSRI in the future if symptoms worsen.  Patient understands the habit-forming nature of benzodiazepines.  Requested that he keep his medications locked up somewhere safe as these are drugs of abuse.      Vaccine counseling  Assessment & Plan:  Patient not interested in coronavirus vaccine.  Will consider meningococcal vaccine.  Will send message to patient's nephrologist to see if he feels it is clinically indicated.      Low vitamin D level  Assessment & Plan:  Given potential for bone loss on chronic prednisone would like to see patient's vitamin D slightly higher.  Will recommend vitamin D 1000 IU daily if okay with patient's nephrologist.  Await reply.        Healthcare maintenance  Assessment & Plan:  Labs checked monthly at each infusion.  Continue healthy diet and exercise.  Consider COVID-vaccine.  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  Likely will be due in 5 years.  Patient has full set of upper dentures and partial on the bottom.  Recommend he be seen by a dentist at some point.  Its been several years since he has been seen by dentist.  Recommend skin check yearly.  Referral placed  for [facility].      Other orders  -     ALPRAZolam; Take 1 tablet (0.5 mg total) by mouth Three (3) times a day.      Return in about 3 months (around 02/01/2022).     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  Has required Granix on 3 occasions, 08/2019 x 2 and 02/2021 x 1.  MMF was held during each of these occasions and has not been restarted since last discontinuation in 02/2021.  Since then, ANCs have been in the 0.5-1.3 range and no further G-CSF has been given.  There have been no infectious complications and no other significant cytopenias.Overall, patient's neutropenia is likely related to immunosuppression: belatacept has a 20% incidence of leukopenia and mycophenolate (not currently on but 19% to 46%).  Recent worsening could be related to recent infection and/or treatment (although brief) with bactrim. We will also work up for additional secondary causes such as nutritional and clonal but suspect he will need adjustment of immunosuppression vs support with granix if that is not possible.        Mild Normocytic anemia    Secondary hyperparathyroidism    History of anxiety/depression: Managed with Lexapro and buspirone - was feeling very down and sluggish on these medications and almost putting patient in fog all the time.  Primary care doctor was working on tapering alprazolam. Anxiety does not happen as much as it used to - somewhat related to transplant.  Now meds are solid.      Sleep apnea:  Observed in hospital but no sleep study.  Untreated.  Patient lays on his side and does not snore according to wife.       STOP-BANG Sleep Apnea Questionnaire  Answer Yes or No       STOP:    Do you SNORE loudly(louder than talking or loud enough to be heard through closed doors?  sometimes    Do you often feel TIRED, fatigue, or sleepy during daytime?  n    Has anyone OBSERVED you stop breathing during your sleep? y    Do you have or are you being treated for high blood PRESSURE?  y       BANG:    BMI more than 35 kg/m2?  n    Age over 25 years old?  n    NECK circumference >16 inches (40cm)?  n    Gender: MALE? y        TOTAL SCORE:  4    HIGH RISK of OSA:   YES 5-8    INTERMEDIATE RISK OF OSA:   YES 3-4    LOW RISK OF OSA:   YES 0-2    Joints hurting like hell:  shoulders, hips, knees.  Used lidocaine patches.  Pain in the hips.      ROS  A comprehensive review of systems was conducted with negative results except as noted in the problem-based concerns above.  Updated History  As part of today's comprehensive wellness visit, I have reviewed and updated the following portions of the patient's history in the electronic record: allergies, current medications, past medical history, past surgical history, past family history, past social history, and active problem list.  Social History:     Social  History     Social History Narrative    Works in Insurance account manager business.  Married and lives in [place], [place] with wife and 3 children.  Prior substance abuse with DUI age 1.  Until 5:16 PM patient would have 1-2 beers per day however none currently.  Prior to 2017 patient snorted cocaine once every 2 to 4 months for several years and denies use since 2017.  Patient also reports having smoked marijuana daily until August 2019.  Former cigarette use and smokeless tobacco in the form of chewing tobacco quit 05/22/2018.      Current Providers   Patient Care Team:  Jaquay Posthumus Joeseph Amor, MD as PCP - General (Internal Medicine)  Munsoor Lizabeth Leyden, MD as Referring Physician  Griffin Dakin, MD as Transplant Nephrologist (Transplant Nephrology)  Griffin Dakin, MD as Referring Physician (Nephrology)  Malva Limes, RN as Transplant Coordinator (Transplant)  Jordan Likes, CPP as Pharmacist (Pharmacist)          Objective:   Vital Signs  BP 120/80 (BP Site: R Arm, BP Position: Sitting, BP Cuff Size: Medium)  - Pulse 70  - Ht 179.5 cm (5' 10.67)  - Wt 85 kg (187 lb 4.8 oz)  - SpO2 98%  - BMI 26.37 kg/m??  Body mass index is 26.37 kg/m??.  Physical Exam  Constitutional:       Appearance: Normal appearance.   HENT:      Head: Normocephalic and atraumatic.      Right Ear: Tympanic membrane, ear canal and external ear normal.      Left Ear: Tympanic membrane, ear canal and external ear normal.      Nose: Nose normal.      Mouth/Throat:      Mouth: Mucous membranes are moist.      Pharynx: Oropharynx is clear.   Eyes:      Extraocular Movements: Extraocular movements intact.      Conjunctiva/sclera: Conjunctivae normal.      Pupils: Pupils are equal, round, and reactive to light.   Cardiovascular:      Rate and Rhythm: Normal rate and regular rhythm.      Pulses: Normal pulses.      Heart sounds: Normal heart sounds.   Pulmonary:      Effort: Pulmonary effort is normal.      Breath sounds: Normal breath sounds.   Abdominal:      General: Abdomen is flat. Bowel sounds are normal.      Palpations: Abdomen is soft.   Musculoskeletal:      Right lower leg: No edema.      Left lower leg: No edema.   Lymphadenopathy:      Cervical: No cervical adenopathy.      Upper Body:      Right upper body: No supraclavicular or axillary adenopathy.      Left upper body: No supraclavicular or axillary adenopathy.   Skin:     General: Skin is warm.   Neurological:      General: No focal deficit present.      Mental Status: He is alert. Mental status is at baseline.   Psychiatric:         Mood and Affect: Mood normal.         Behavior: Behavior normal.         Thought Content: Thought content normal.     PHQ-9 Score: 7  Screening complete, depression identified / today's follow-up action documented in note  Note - This record has been created using [employer]. Chart creation errors have been sought, but may not always have been located. Such creation errors do not reflect on the standard of medical care.

## 2021-11-02 DIAGNOSIS — D849 Immunodeficiency, unspecified: Principal | ICD-10-CM

## 2021-11-02 DIAGNOSIS — Z940 Kidney transplant status: Principal | ICD-10-CM

## 2021-11-02 DIAGNOSIS — D72819 Decreased white blood cell count, unspecified: Principal | ICD-10-CM

## 2021-11-02 NOTE — Unmapped (Signed)
Patient not interested in coronavirus vaccine.  Will consider meningococcal vaccine.  Will send message to patient's nephrologist to see if he feels it is clinically indicated.

## 2021-11-02 NOTE — Unmapped (Signed)
Patient stable on omeprazole.  At some point would benefit from getting an upper endoscopy for further evaluation.  Also consider checking Helicobacter pylori.

## 2021-11-02 NOTE — Unmapped (Signed)
Recommend conservative management with ice 10 to 20 minutes 3 times a day, Tylenol and maintain range of motion.  Patient likely has some rotator cuff injury which may benefit from physical therapy.  Also consider referral to orthopedics for steroid injection.  Although systemic absorption of Voltaren minimal with topical would like to avoid anything that may cause harm to his kidney transplant.

## 2021-11-02 NOTE — Unmapped (Signed)
Labs checked monthly at each infusion.  Continue healthy diet and exercise.  Consider COVID-vaccine.  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  Likely will be due in 5 years.  Patient has full set of upper dentures and partial on the bottom.  Recommend he be seen by a dentist at some point.  Its been several years since he has been seen by dentist.  Recommend skin check yearly.  Referral placed for [facility].

## 2021-11-02 NOTE — Unmapped (Signed)
Given potential for bone loss on chronic prednisone would like to see patient's vitamin D slightly higher.  Will recommend vitamin D 1000 IU daily if okay with patient's nephrologist.  Await reply.

## 2021-11-02 NOTE — Unmapped (Signed)
Refer to orthopedics for injection

## 2021-11-02 NOTE — Unmapped (Signed)
At goal on current regimen.  Patient to continue monitoring blood pressure at home.

## 2021-11-02 NOTE — Unmapped (Signed)
Creatinine is not ideal however has been stable past few years.  Labs are regularly monitored with each infusion of Nulojix.  Would be nice to see patient come off prednisone at some point.  We will need to monitor blood sugars, bone health and discuss stress dosing with infection.

## 2021-11-02 NOTE — Unmapped (Signed)
It sounds like patient's symptoms with his mood started after he developed kidney failure and also complications with his kidney transplant.  Although his health is stable right now his symptoms do remain.  He has tapered down on alprazolam and only takes 2-3 times per week.  Previously he was taking it every day as prevention.  Patient says he did not tolerate SSRI with buspirone.  He said it made him feel very sluggish and down.  I am okay prescribing a limited number of alprazolam per month #10 with refills as needed by request.  Discussed possibility of counseling which patient had during his transplantation.  He feels like he would not benefit from more.  Consider trial of a different SSRI in the future if symptoms worsen.  Patient understands the habit-forming nature of benzodiazepines.  Requested that he keep his medications locked up somewhere safe as these are drugs of abuse.

## 2021-11-07 ENCOUNTER — Ambulatory Visit: Admit: 2021-11-07 | Discharge: 2021-11-08 | Payer: PRIVATE HEALTH INSURANCE

## 2021-11-07 LAB — RENAL FUNCTION PANEL
ALBUMIN: 4.4 g/dL (ref 3.4–5.0)
ANION GAP: 8 mmol/L (ref 5–14)
BLOOD UREA NITROGEN: 19 mg/dL (ref 9–23)
BUN / CREAT RATIO: 11
CALCIUM: 9.7 mg/dL (ref 8.7–10.4)
CHLORIDE: 104 mmol/L (ref 98–107)
CO2: 28 mmol/L (ref 20.0–31.0)
CREATININE: 1.71 mg/dL — ABNORMAL HIGH
EGFR CKD-EPI (2021) MALE: 52 mL/min/{1.73_m2} — ABNORMAL LOW (ref >=60)
GLUCOSE RANDOM: 66 mg/dL — ABNORMAL LOW (ref 70–179)
PHOSPHORUS: 2.5 mg/dL (ref 2.4–5.1)
POTASSIUM: 3.9 mmol/L (ref 3.4–4.8)
SODIUM: 140 mmol/L (ref 135–145)

## 2021-11-07 LAB — CBC W/ AUTO DIFF
BASOPHILS ABSOLUTE COUNT: 0 10*9/L (ref 0.0–0.1)
BASOPHILS RELATIVE PERCENT: 0.9 %
EOSINOPHILS ABSOLUTE COUNT: 0.1 10*9/L (ref 0.0–0.5)
EOSINOPHILS RELATIVE PERCENT: 3 %
HEMATOCRIT: 35.2 % — ABNORMAL LOW (ref 39.0–48.0)
HEMOGLOBIN: 12.5 g/dL — ABNORMAL LOW (ref 12.9–16.5)
LYMPHOCYTES ABSOLUTE COUNT: 0.9 10*9/L — ABNORMAL LOW (ref 1.1–3.6)
LYMPHOCYTES RELATIVE PERCENT: 42.3 %
MEAN CORPUSCULAR HEMOGLOBIN CONC: 35.7 g/dL (ref 32.0–36.0)
MEAN CORPUSCULAR HEMOGLOBIN: 32.8 pg — ABNORMAL HIGH (ref 25.9–32.4)
MEAN CORPUSCULAR VOLUME: 92.1 fL (ref 77.6–95.7)
MEAN PLATELET VOLUME: 7.1 fL (ref 6.8–10.7)
MONOCYTES ABSOLUTE COUNT: 0.4 10*9/L (ref 0.3–0.8)
MONOCYTES RELATIVE PERCENT: 17.8 %
NEUTROPHILS ABSOLUTE COUNT: 0.8 10*9/L — ABNORMAL LOW (ref 1.8–7.8)
NEUTROPHILS RELATIVE PERCENT: 36 %
PLATELET COUNT: 195 10*9/L (ref 150–450)
RED BLOOD CELL COUNT: 3.82 10*12/L — ABNORMAL LOW (ref 4.26–5.60)
RED CELL DISTRIBUTION WIDTH: 14.2 % (ref 12.2–15.2)
WBC ADJUSTED: 2.2 10*9/L — ABNORMAL LOW (ref 3.6–11.2)

## 2021-11-07 LAB — SLIDE REVIEW

## 2021-11-07 MED ADMIN — belatacept (NULOJIX) 425 mg in sodium chloride (NS) 0.9 % 100 mL IVPB: 5 mg/kg | INTRAVENOUS | @ 14:00:00 | Stop: 2021-11-07

## 2021-11-07 NOTE — Unmapped (Signed)
1610 Patient arrived to the Transplant Infusion Room today for belatacept 425mg , Condition: well; Mobility: ambulating; accompanied by self.   See Flowsheet and MAR for all details of visit.   0921 VS stable.  0935 PIV placed and secured with coban; labs collected and sent, urine no orders found.  9604 Infusion initiated. Patient educated to notify nursing staff if they experience urticaria, erythema, nausea, shortness of breath, nausea, metal taste in mouth, excessive oral or postnasal drainage and any other changes in status which could be side effects from initiating this medication.  1020 Infusion complete.   1030 VS stable, Line flushed with NS, PIV removed and secured with coban. Patient left clinic today, Condition: well; Mobility: ambulating; accompanied by self.

## 2021-11-16 NOTE — Unmapped (Signed)
[facility] Clinical Assessment & Refill Coordination Note    Ryan Moon, DOB: 16-Jul-1985  Phone: [phone] (home)     All above HIPAA information was verified with patient.     Was a Nurse, learning disability used for this call? No    Specialty Medication(s):   Transplant: Prednisone 5mg      Current Outpatient Medications   Medication Sig Dispense Refill   ??? ALPRAZolam (XANAX) 0.5 MG tablet Take 1 tablet (0.5 mg total) by mouth Three (3) times a day. 10 tablet 0   ??? amLODIPine (NORVASC) 5 MG tablet Take 1 tablet (5 mg total) by mouth every evening. 30 tablet 11   ??? aspirin (ECOTRIN) 81 MG tablet Take 1 tablet (81 mg total) by mouth daily. 30 tablet 11   ??? belatacept (NULOJIX) 250 mg SolR Belatacept  5 mg/kg every 2 weeks for 5 doses then once every 4 weeks 14.5 mL 0   ??? losartan (COZAAR) 50 MG tablet Take 1 tablet (50 mg total) by mouth Two (2) times a day. 60 tablet 11   ??? magnesium oxide-Mg AA chelate (MAGNESIUM, AMINO ACID CHELATE,) 133 mg Take 1 tablet by mouth daily. 100 tablet 11   ??? omeprazole (PRILOSEC) 20 MG capsule Take 1 capsule (20 mg total) by mouth daily. 30 capsule 11   ??? ondansetron (ZOFRAN-ODT) 4 MG disintegrating tablet Take 1 tablet (4 mg total) by mouth every eight (8) hours as needed.     ??? predniSONE (DELTASONE) 5 MG tablet Take 1 tablet (5 mg total) by mouth daily. 30 tablet 11     No current facility-administered medications for this visit.        Changes to medications: Ryan Moon reports no changes at this time.    Allergies   Allergen Reactions   ??? Ibuprofen    ??? Acetaminophen Nausea And Vomiting       Changes to allergies: No    SPECIALTY MEDICATION ADHERENCE     Prednisone 5mg   : 10 days of medicine on hand       Medication Adherence    Patient reported X missed doses in the last month: 0  Specialty Medication: prednisone 5mg           Specialty medication(s) dose(s) confirmed: Regimen is correct and unchanged.     Are there any concerns with adherence? No    Adherence counseling provided? Not needed    CLINICAL MANAGEMENT AND INTERVENTION      Clinical Benefit Assessment:    Do you feel the medicine is effective or helping your condition? Yes    Clinical Benefit counseling provided? Not needed    Adverse Effects Assessment:    Are you experiencing any side effects? No    Are you experiencing difficulty administering your medicine? No    Quality of Life Assessment:         How many days over the past month did your transplant  keep you from your normal activities? For example, brushing your teeth or getting up in the morning. 0    Have you discussed this with your provider? Not needed    Acute Infection Status:    Acute infections noted within Epic:  No active infections  Patient reported infection: None    Therapy Appropriateness:    Is therapy appropriate and patient progressing towards therapeutic goals? Yes, therapy is appropriate and should be continued    DISEASE/MEDICATION-SPECIFIC INFORMATION      N/A    PATIENT SPECIFIC  NEEDS     - Does the patient have any physical, cognitive, or cultural barriers? No    - Is the patient high risk? No    - Does the patient require a Care Management Plan? No     SOCIAL DETERMINANTS OF HEALTH     At the [facility], we have learned that life circumstances - like trouble affording food, housing, utilities, or transportation can affect the health of many of our patients.   That is why we wanted to ask: are you currently experiencing any life circumstances that are negatively impacting your health and/or quality of life? No    Social Determinants of Health     Financial Resource Strain: Low Risk    ??? Difficulty of Paying Living Expenses: Not hard at all   Internet Connectivity: Not on file   Food Insecurity: No Food Insecurity   ??? Worried About Programme researcher, broadcasting/film/video in the Last Year: Never true   ??? Ran Out of Food in the Last Year: Never true   Tobacco Use: Medium Risk   ??? Smoking Tobacco Use: Former   ??? Smokeless Tobacco Use: Former   ??? Passive Exposure: Not on file   Housing/Utilities: Low Risk    ??? Within the past 12 months, have you ever stayed: outside, in a car, in a tent, in an overnight shelter, or temporarily in someone else's home (i.e. couch-surfing)?: No   ??? Are you worried about losing your housing?: No   ??? Within the past 12 months, have you been unable to get utilities (heat, electricity) when it was really needed?: No   Alcohol Use: Not At Risk   ??? How often do you have a drink containing alcohol?: Monthly or less   ??? How many drinks containing alcohol do you have on a typical day when you are drinking?: 1 - 2   ??? How often do you have 5 or more drinks on one occasion?: Never   Transportation Needs: No Transportation Needs   ??? Lack of Transportation (Medical): No   ??? Lack of Transportation (Non-Medical): No   Substance Use: Not on file   Health Literacy: Low Risk    ??? : Never   Physical Activity: Not on file   Interpersonal Safety: Not on file   Stress: Not on file   Intimate Partner Violence: Not on file   Depression: Not on file   Social Connections: Not on file       Would you be willing to receive help with any of the needs that you have identified today? Not applicable       SHIPPING     Specialty Medication(s) to be Shipped:   Transplant: Prednisone 5mg     Other medication(s) to be shipped: norvasc, aspirin, cozaar, prilosec     Changes to insurance: No    Delivery Scheduled: Yes, Expected medication delivery date: 11/22/2021.     Medication will be delivered via UPS to the confirmed prescription address in [facility].    The patient will receive a drug information handout for each medication shipped and additional FDA Medication Guides as required.  Verified that patient has previously received a Conservation officer, historic buildings and a Surveyor, mining.    The patient or caregiver noted above participated in the development of this care plan and knows that they can request review of or adjustments to the care plan at any time.      All of the patient's questions and  concerns have been addressed.    [name]   [facility] Specialty Pharmacist

## 2021-11-21 MED FILL — AMLODIPINE 5 MG TABLET: ORAL | 30 days supply | Qty: 30 | Fill #6

## 2021-11-21 MED FILL — PREDNISONE 5 MG TABLET: ORAL | 30 days supply | Qty: 30 | Fill #6

## 2021-11-21 MED FILL — ASPIRIN 81 MG TABLET,DELAYED RELEASE: ORAL | 30 days supply | Qty: 30 | Fill #8

## 2021-11-21 MED FILL — OMEPRAZOLE 20 MG CAPSULE,DELAYED RELEASE: ORAL | 30 days supply | Qty: 30 | Fill #3

## 2021-11-21 MED FILL — LOSARTAN 50 MG TABLET: ORAL | 30 days supply | Qty: 60 | Fill #6

## 2021-12-06 ENCOUNTER — Ambulatory Visit: Admit: 2021-12-06 | Discharge: 2021-12-07 | Payer: PRIVATE HEALTH INSURANCE

## 2021-12-06 LAB — CBC W/ AUTO DIFF
BASOPHILS ABSOLUTE COUNT: 0 10*9/L (ref 0.0–0.1)
BASOPHILS RELATIVE PERCENT: 0.6 %
EOSINOPHILS ABSOLUTE COUNT: 0.1 10*9/L (ref 0.0–0.5)
EOSINOPHILS RELATIVE PERCENT: 3.9 %
HEMATOCRIT: 34.3 % — ABNORMAL LOW (ref 39.0–48.0)
HEMOGLOBIN: 12.2 g/dL — ABNORMAL LOW (ref 12.9–16.5)
LYMPHOCYTES ABSOLUTE COUNT: 1.1 10*9/L (ref 1.1–3.6)
LYMPHOCYTES RELATIVE PERCENT: 51.4 %
MEAN CORPUSCULAR HEMOGLOBIN CONC: 35.7 g/dL (ref 32.0–36.0)
MEAN CORPUSCULAR HEMOGLOBIN: 33 pg — ABNORMAL HIGH (ref 25.9–32.4)
MEAN CORPUSCULAR VOLUME: 92.6 fL (ref 77.6–95.7)
MEAN PLATELET VOLUME: 7.3 fL (ref 6.8–10.7)
MONOCYTES ABSOLUTE COUNT: 0.4 10*9/L (ref 0.3–0.8)
MONOCYTES RELATIVE PERCENT: 18.4 %
NEUTROPHILS ABSOLUTE COUNT: 0.5 10*9/L — ABNORMAL LOW (ref 1.8–7.8)
NEUTROPHILS RELATIVE PERCENT: 25.7 %
PLATELET COUNT: 196 10*9/L (ref 150–450)
RED BLOOD CELL COUNT: 3.7 10*12/L — ABNORMAL LOW (ref 4.26–5.60)
RED CELL DISTRIBUTION WIDTH: 14.2 % (ref 12.2–15.2)
WBC ADJUSTED: 2.1 10*9/L — ABNORMAL LOW (ref 3.6–11.2)

## 2021-12-06 LAB — BASIC METABOLIC PANEL
ANION GAP: 10 mmol/L (ref 5–14)
BLOOD UREA NITROGEN: 25 mg/dL — ABNORMAL HIGH (ref 9–23)
BUN / CREAT RATIO: 18
CALCIUM: 9.4 mg/dL (ref 8.7–10.4)
CHLORIDE: 106 mmol/L (ref 98–107)
CO2: 25 mmol/L (ref 20.0–31.0)
CREATININE: 1.41 mg/dL — ABNORMAL HIGH
EGFR CKD-EPI (2021) MALE: 66 mL/min/{1.73_m2} (ref >=60)
GLUCOSE RANDOM: 67 mg/dL — ABNORMAL LOW (ref 70–99)
POTASSIUM: 3.5 mmol/L (ref 3.4–4.8)
SODIUM: 141 mmol/L (ref 135–145)

## 2021-12-06 LAB — SLIDE REVIEW

## 2021-12-06 LAB — MAGNESIUM: MAGNESIUM: 1.9 mg/dL (ref 1.6–2.6)

## 2021-12-06 LAB — PHOSPHORUS: PHOSPHORUS: 2.6 mg/dL (ref 2.4–5.1)

## 2021-12-06 MED ADMIN — belatacept (NULOJIX) 425 mg in sodium chloride (NS) 0.9 % 100 mL IVPB: 5 mg/kg | INTRAVENOUS | @ 13:00:00 | Stop: 2021-12-06

## 2021-12-06 NOTE — Unmapped (Signed)
1610 Patient arrived to the Transplant Infusion Room today for belatacept 425 mg, Condition: well; Mobility: ambulating; accompanied by self.   See Flowsheet and MAR for all details of visit.   0835 VS stable.  0845 PIV placed and secured with coban; labs collected and sent, urine no orders found.  9604 Infusion initiated. Patient educated to notify nursing staff if they experience urticaria, erythema, nausea, shortness of breath, nausea, metal taste in mouth, excessive oral or postnasal drainage and any other changes in status which could be side effects from initiating this medication.  0930 Infusion complete.   0933 VS stable, Line flushed with NS, PIV removed and secured with coban. Patient left clinic today, Condition: well; Mobility: ambulating; accompanied by self.

## 2021-12-08 ENCOUNTER — Ambulatory Visit
Admit: 2021-12-08 | Discharge: 2021-12-09 | Payer: PRIVATE HEALTH INSURANCE | Attending: Rehabilitative and Restorative Service Providers" | Primary: Rehabilitative and Restorative Service Providers"

## 2021-12-08 MED ORDER — ALPRAZOLAM 0.5 MG TABLET
ORAL_TABLET | Freq: Two times a day (BID) | ORAL | 0 refills | 5 days | Status: CP | PRN
Start: 2021-12-08 — End: ?

## 2021-12-08 NOTE — Unmapped (Addendum)
Plan:  - Ice affected area for 15-20 minutes 2-3 times daily with palm facing up toward the ceiling  - Refer to ultrasound provider for evaluation and possible guided injection   - If you have any questions or concerns, contact our sports team coverage line at [phone]    Biceps Tendinitis: Exercises  Your Care Instructions  Here are some examples of typical rehabilitation exercises for your condition. Start each exercise slowly. Ease off the exercise if you start to have pain.  Your doctor or physical therapist will tell you when you can start these exercises and which ones will work best for you.  How to do the exercises  Biceps stretch   Stand and hold your affected arm out to the side, with your hand at about hip level.  Gently bend your wrist back so that your fingers point down toward the floor.  You may also do this next to a wall and rest your fingers on the wall.  For more of a stretch, bend your head to the opposite side of your affected arm.  Hold for 15 to 30 seconds.  Repeat 2 to 4 times.      Pendulum swing    Note: If you have pain in your back, do not do this exercise.  Hold on to a table or the back of a chair with your good arm. Then bend forward a little and let your sore arm hang straight down. This exercise does not use the arm muscles. Rather, use your legs and your hips to create movement that makes your arm swing freely.  Use the movement from your hips and legs to guide the slightly swinging arm back and forth like a pendulum (or elephant trunk). Then guide it in circles that start small (about the size of a dinner plate). Make the circles a bit larger each day, as your pain allows.  Do this exercise for 5 minutes, 5 to 7 times each day.  As you have less pain, try bending over a little farther to do this exercise. This will increase the amount of movement at your shoulder.    Scapular exercise: Arm reach    Lie flat on your back. This exercise is a very slight motion that starts with your arms raised (elbows straight, arms straight).  From this position, reach higher toward the sky or ceiling. Keep your elbows straight. All motion should be from your shoulder blade only.  Relax your arms back to where you started.  Repeat 8 to 12 times.  Arm raise to the side - do this movement with a resistance band    Note: During this strengthening exercise, your arm should stay about 30 degrees to the front of your side.  Slowly raise your injured arm to the side, with your thumb facing up. Raise your arm 60 degrees at the most (shoulder level is 90 degrees).  Hold the position for 3 to 5 seconds. Then lower your arm back to your side. If you need to, bring your good arm across your body and place it under the elbow as you lower your injured arm. Use your good arm to keep your injured arm from dropping down too fast.  Repeat 8 to 12 times.  When you first start out, don't hold any extra weight in your hand. As you get stronger, you may use a 1-pound to 2-pound dumbbell or a small can of food.      Internal rotator strengthening exercise  Start by tying a piece of elastic exercise material to a doorknob. You can use surgical tubing or Thera-Band.  Stand or sit with your shoulder relaxed and your elbow bent 90 degrees. Your upper arm should rest comfortably against your side. Squeeze a rolled towel between your elbow and your body for comfort. This will help keep your arm at your side.  Hold one end of the elastic band in the hand of the painful arm.  Slowly rotate your forearm toward your body until it touches your belly. Slowly move it back to where you started.  Keep your elbow and upper arm firmly tucked against the towel roll or at your side.  Repeat 8 to 12 times.  External rotator strengthening exercise    Start by tying a piece of elastic exercise material to a doorknob. You can use surgical tubing or Thera-Band. (You may also hold one end of the band in each hand.)  Stand or sit with your shoulder relaxed and your elbow bent 90 degrees. Your upper arm should rest comfortably against your side. Squeeze a rolled towel between your elbow and your body for comfort. This will help keep your arm at your side.  Hold one end of the elastic band with the hand of the painful arm.  Start with your forearm across your belly. Slowly rotate the forearm out away from your body. Keep your elbow and upper arm tucked against the towel roll or the side of your body until you begin to feel tightness in your shoulder. Slowly move your arm back to where you started.  Repeat 8 to 12 times.         Resisted supination   Sit leaning forward with your legs slightly spread. Then place your forearm on your thigh with your hand and affected wrist in front of your knee.  Grasp one end of an exercise band with your palm down, and step on the other end.  Keeping your wrist straight, roll your palm outward and away from your thigh for a count of 2, then slowly move your wrist back to the starting position to a count of 5.  Repeat 8 to 12 times.    Resisted elbow flexion   Using your affected arm, hold one end of an elastic band in your hand.  Place the other end of the band under your foot on the same side of your body as your affected arm.  Slowly bend your elbow and bring your hand toward your shoulder. Your palm and the underside of your wrist should be facing up as you pull the band toward your shoulder. Count to 2 as you pull up.  Relax and slowly return to your starting position. Count to 5 as you return to the start.  Repeat 8 to 12 times.    Resisted elbow flexion at shoulder level   Tie the ends of an exercise band together to form a loop. Attach one end of the loop to a secure object or shut a door on it to hold it in place. (Or you can have someone hold one end of the loop to provide resistance.) The band should be at shoulder level.  Stand facing where you have tied or fastened the band.  Start with your affected arm held out straight, holding the band in your hand.  Slowly bend your elbow to 90 degrees, bringing your hand toward your forehead. Count to 2 as you pull the band toward your head.  Relax and  slowly return to your starting position. Count to 5 as you return to the start.  Repeat 8 to 12 times.       Low Back Pain: Exercises  Your Care Instructions  Here are some examples of typical rehabilitation exercises for your condition. Start each exercise slowly. Ease off the exercise if you start to have pain.  Your doctor or physical therapist will tell you when you can start these exercises and which ones will work best for you.  How to do the exercises  Press-up    Lie on your stomach, supporting your body with your forearms.  Press your elbows down into the floor to raise your upper back. As you do this, relax your stomach muscles and allow your back to arch without using your back muscles. As your press up, do not let your hips or pelvis come off the floor.  Hold for 15 to 30 seconds, then relax.  Repeat 2 to 4 times.  Alternate arm and leg (bird dog) exercise    Start on the floor, on your hands and knees.  Tighten your belly muscles.  Raise one leg off the floor, and hold it straight out behind you. Be careful not to let your hip drop down, because that will twist your trunk.  Hold for about 6 seconds, then lower your leg and switch to the other leg.  Repeat 8 to 12 times on each leg.  Over time, work up to holding for 10 to 30 seconds each time.  If you feel stable and secure with your leg raised, try raising the opposite arm straight out in front of you at the same time.  Knee-to-chest exercise    Lie on your back with your knees bent and your feet flat on the floor.  Bring one knee to your chest, keeping the other foot flat on the floor (or keeping the other leg straight, whichever feels better on your lower back).  Keep your lower back pressed to the floor. Hold for at least 15 to 30 seconds.  Relax, and lower the knee to the starting position.  Repeat with the other leg. Repeat 2 to 4 times with each leg.  To get more stretch, put your other leg flat on the floor while pulling your knee to your chest.    Hip flexor stretch    Kneel on the floor with one knee bent and one leg behind you. Place your forward knee over your foot. Keep your other knee touching the floor.  Slowly push your hips forward until you feel a stretch in the upper thigh of your rear leg.  Hold the stretch for at least 15 to 30 seconds. Repeat with your other leg.  Do 2 to 4 times on each side.       Thank you for coming to [facility] and our clinic today!     We aim to provide you with the highest quality, individualized care.  If you have any unanswered questions after the visit, please do not hesitate to reach out to [place] on MyChart or leave a message for the nurse.  ?  MyChart messages: These messages can be sent to your provider and will be checked by their clinical support staff.? The messages are checked throughout the day during normal business hours from 8:30 am-4:00 pm Monday-Friday, however responses may take up to 48 hours.? Please use this method of communication for non-urgent and non-emergent concerns, questions, refill requests or inquiries  only.? ?Our team will help respond to all of your questions.? Please note that you may be asked to see a provider by either a telehealth or in person visit if it is deemed your questions are best handled in the clinic setting in person.??  ?  Please keep in mind, these messages are not real time communications, so be patient when waiting for a response.    If you do not have access to MyChart, do not know how to use MyChart or have an issue that may require more extensive discussion, please call the nurses' call line: [phone].? This line is checked throughout the day and will be responded to as time allows.? Please note that return calls could take up to 48 hours, depending on the nature of the need.?  ?  If you have an issue that requires emergent attention that cannot wait; either call the Orthopaedics resident on call at [phone], consider coming to our [facility] walk-in clinic, or go to the nearest Emergency Department.    If you need to schedule future appointments, please call [phone].     We look forward to seeing you again in the future and appreciate you choosing [place] for your care!    Thank you,                We provide innovative and comprehensive patient centered care that is supported by evidence-based research                                                                                                    RESEARCH PARTICIPATION    Please check out our current research studies to see if you or someone you know may qualify at:    [email or web address]

## 2021-12-08 NOTE — Unmapped (Signed)
Patient is requesting the following refill  Requested Prescriptions     Pending Prescriptions Disp Refills    ALPRAZolam (XANAX) 0.5 MG tablet 10 tablet 0     Sig: Take 1 tablet (0.5 mg total) by mouth Three (3) times a day.       Order pended. Please advise. Thanks    Last OV: 11/01/2021   Next OV: Visit date not found

## 2021-12-08 NOTE — Unmapped (Signed)
SPORTS MEDICINE NEW VISIT    ASSESSMENT AND PLAN       ICD-10-CM   1. Chronic pain of both shoulders  M25.511    G89.29    M25.512   2. Biceps tendinitis of both shoulders  M75.21    M75.22   3. Tendinitis of right rotator cuff  M75.81       PLAN  Requested Prescriptions      No prescriptions requested or ordered in this encounter     - This is a kidney transplant patient and NSAIDs are contraindicated  - Home exercise program provided  - Ice affected area for 15-20 minutes every hour as needed  - Refer to non-op sports medicine for further evaluation under ultrasound with consideration of guided injection, if warranted    Return for Ultrasound Procedure/guided injection - bilateral biceps tendon sheaths.    Procedure(s):  None    Test Results  Imaging  None performed today.    SUBJECTIVE     Chief Complaint:   Chief Complaint   Patient presents with    Right Shoulder - Pain     + 6 months, sharp pain biceps tendon region, works with concrete    Left Shoulder - Pain     + 6 months, sharp pain biceps tendon region, works with concrete       Hand Dominance: Right    History of Present Illness: 36 y.o. male who presents for evaluation of Bilateral shoulder pain. 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 He reports worsening anterior shoulder pain, right worse than left, and intermittent lateral sided right shoulder pain. He is unable to take NSAIDs as a kidney transplant patient.   Pain is located anterior and lateral. Discomfort is described as aching, sharp/stabbing, and throbbing.   Aggravators: increased activity, overhead reaching  Treatments tried: Ice    Past Medical History:   Past Medical History:   Diagnosis Date    Renal vasculitis (CMS-HCC)     Secondary hyperparathyroidism (CMS-HCC)          OBJECTIVE     Physical Exam:  Vitals:   Wt Readings from Last 3 Encounters:   12/04/21 84.8 kg (187 lb)   11/06/21 84.8 kg (187 lb)   11/01/21 85 kg (187 lb 4.8 oz)     Estimated body mass index is 26.33 kg/m?? as calculated from the following:    Height as of 11/01/21: 179.5 cm (5' 10.67).    Weight as of 12/04/21: 84.8 kg (187 lb).  Gen: Well-appearing male in no acute distress    MUSCULOSKELETAL       RIGHT shoulder LEFT shoulder   Inspection: No swelling, erythema, or deformity  Palpation: Tender: rotator cuff footprint, long head biceps tendon/bicepital groove  Range of motion:  normal   Special Tests: Positive Neer's test, Positive Hawkin's test, 5/5 Supraspinatus strength with pain, Positive Speed's test, 5/5 infraspinatus strength Inspection: No swelling, erythema, or deformity  Palpation: Tender: long head biceps tendon/bicepital groove  Range of motion:  normal   Special Tests: Positive Neer's test, Positive Hawkin's test, Positive Speed's test, 5/5 supraspinatus strength  without pain, 5/5 infraspinatus strength       MEDICAL DECISION MAKING (level of service defined by 2/3 elements)     Number/Complexity of Problems Addressed 2 or more self-limited or minor problems (99203/99213)   Amount/Complexity of Data to be Reviewed/Analyzed 2 points: Review prior notes (1 point per  unique source); Review test results (1 point per unique test); Order tests (1 point per unique test) (99203/99213)   Risk of Complications/Morbidity/Mortality of Management Physical Therapy/Occupational Therapy (99203/99213)         ADMINISTRATIVE     I have personally reviewed and interpreted the images (as available).  I have personally reviewed prior records and incorporated relevant information above (as available).      DME     DME ORDER:  Dx:  ,

## 2021-12-12 DIAGNOSIS — Z940 Kidney transplant status: Principal | ICD-10-CM

## 2021-12-12 DIAGNOSIS — D849 Immunodeficiency, unspecified: Principal | ICD-10-CM

## 2021-12-12 MED ORDER — MG-PLUS-PROTEIN 133 MG TABLET
ORAL_TABLET | Freq: Every day | ORAL | 11 refills | 100 days
Start: 2021-12-12 — End: 2022-12-12

## 2021-12-12 NOTE — Unmapped (Signed)
[facility] Refill Coordination Note    Specialty Medication(s) to be Shipped:   Transplant: Prednisone 5mg     Other medication(s) to be shipped:  amlodipine, asa, losartan, mg, omeprazole     Ryan Moon, DOB: [birth year]  Phone: [phone] (home)       All above HIPAA information was verified with patient.     Was a Nurse, learning disability used for this call? No    Completed refill call assessment today to schedule patient's medication shipment from the [facility] [phone]).  All relevant notes have been reviewed.     Specialty medication(s) and dose(s) confirmed: Regimen is correct and unchanged.   Changes to medications: Ryan Moon reports no changes at this time.  Changes to insurance: No  New side effects reported not previously addressed with a pharmacist or physician: None reported  Questions for the pharmacist: No    Confirmed patient received a Conservation officer, historic buildings and a Surveyor, mining with first shipment. The patient will receive a drug information handout for each medication shipped and additional FDA Medication Guides as required.       DISEASE/MEDICATION-SPECIFIC INFORMATION        N/A    SPECIALTY MEDICATION ADHERENCE     Medication Adherence    Patient reported X missed doses in the last month: 0  Specialty Medication: Prednisone 5mg   Patient is on additional specialty medications: No              Were doses missed due to medication being on hold? No    Prednisone 5 mg: 7 days of medicine on hand     REFERRAL TO PHARMACIST     Referral to the pharmacist: Not needed      [facility]     Shipping address confirmed in Epic.     Delivery Scheduled: Yes, Expected medication delivery date: 12/19/21.     Medication will be delivered via UPS to the prescription address in Epic WAM.    Tera Helper   [facility] Specialty Pharmacist

## 2021-12-13 MED ORDER — MG-PLUS-PROTEIN 133 MG TABLET
ORAL_TABLET | Freq: Every day | ORAL | 3 refills | 90 days | Status: CP
Start: 2021-12-13 — End: 2022-12-13
  Filled 2021-12-19: qty 90, 90d supply, fill #0

## 2021-12-19 MED FILL — OMEPRAZOLE 20 MG CAPSULE,DELAYED RELEASE: ORAL | 30 days supply | Qty: 30 | Fill #4

## 2021-12-19 MED FILL — PREDNISONE 5 MG TABLET: ORAL | 30 days supply | Qty: 30 | Fill #7

## 2021-12-19 MED FILL — ASPIRIN 81 MG TABLET,DELAYED RELEASE: ORAL | 30 days supply | Qty: 30 | Fill #9

## 2021-12-19 MED FILL — AMLODIPINE 5 MG TABLET: ORAL | 30 days supply | Qty: 30 | Fill #7

## 2021-12-19 MED FILL — LOSARTAN 50 MG TABLET: ORAL | 30 days supply | Qty: 60 | Fill #7

## 2021-12-28 ENCOUNTER — Ambulatory Visit: Admit: 2021-12-28 | Discharge: 2021-12-29 | Payer: PRIVATE HEALTH INSURANCE

## 2021-12-28 NOTE — Unmapped (Signed)
SPORTS MEDICINE RETURN VISIT    ASSESSMENT AND PLAN      Diagnosis ICD-10-CM Associated Orders   1. Chronic pain of both shoulders  M25.511 XR Shoulder 3 Or More Views Bilateral    G89.29     M25.512       2. Chronic left shoulder pain  M25.512 Ambulatory referral to Physical Therapy    G89.29            Pain at anterior [facility] join, no pain with LHB testing, but has considerable pain with labrum testing  Suspect SLAP tear playing a role in his current pain.  RC asymptomatic.  XR shows small calcifications in the RC, otherwise WNL.     --PT consult, HEP given in advance of PT   --activity modifications discussed  --discussed avoiding PO NSAIDs given his renal history, ok to trial voltaren.  Recommended scheduling tylenol q8h prn for pain control.     No follow-ups on file.    Procedure(s):  none      SUBJECTIVE     Chief Complaint:   Chief Complaint   Patient presents with   ??? Right Shoulder - Pain     Chest/anterior shoulder area - feels things have felt about the same as previous visit with Maurice March   ??? Left Shoulder - Pain       History of Present Illness: 36 y.o. male who presents for evaluatio nof bilateral shoulder pain.  Started 6+ months ago, bilateral pain.  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  Pain localized to anterior GH joint, right worse than left.  Treating with activity modification. Unable to take NSAIDs due to renal transplant.  No instability.  Pain described as a dull ache that becomes intermittently sharp and stabbing.      Past Medical History:   Past Medical History:   Diagnosis Date   ??? Renal vasculitis (CMS-HCC)    ??? Secondary hyperparathyroidism (CMS-HCC)          OBJECTIVE     Physical Exam:  Vitals:   Wt Readings from Last 3 Encounters:   12/04/21 84.8 kg (187 lb)   11/06/21 84.8 kg (187 lb)   11/01/21 85 kg (187 lb 4.8 oz)     Estimated body mass index is 26.33 kg/m?? as calculated from the following:    Height as of 11/01/21: 179.5 cm (5' 10.67).    Weight as of 12/04/21: 84.8 kg (187 lb).  Gen: Well-appearing male in no acute distress  MSK:   Bilateral Shoulder Exam    INSPECTION: No swelling, erythema, bruising or atrophy. Symmetric muscle mass, no AC joint asymmetry.  No prominence at medial scapular border  PALPATION: TTP at: anterior GH joint.  no TTP at ACJ or bicipital groove  AROM:  0-180 of flexion   0-180 of abduction   90 deg ER @ 90 deg abd  90 deg IR @ 90 deg abd  70 deg ER @ neutral   back scratch to T4 bilaterally  * Indicates pain with ROM  Scapular dyskenesis present.  Full ROM at C-spine.   PROM:          ER @ 90 deg abd: 90                         IR @ 90 deg abd: 90  STRENGTH: Supraspinatus: 5/5   Subscapularis 5/5   Infraspinatus/teres minor: 5/5  NEUROVASCULAR: 2+ radial pulse, intact  sensation to multiple dermatomes distally, <2 sec distal capillary refill  SPECIAL TESTS:      Jobe's: negative  Drop Arm: negative  Apley Lift-off/Belly Press: negative  ER lag (45): negative  Hawkin's: positive  Neer's: negative  AC Joint: Cross-Arm Compression: negative  Biceps/Labrum:   Speed's: negative  O'Briens: positive thumb pronated  Biceps I and JX:[id number]  Instability:  Apprehension: negative, relocation deferred  Jerk (post): negative  Load and Shift (ant): Grade I  Sulcus sign (inf): negative    Imaging/other tests: XR 12/28/21: mild calcific tendinosis b/l shoulder.      Orthopaedic PROMIS:  [facility] PROMIS 12/08/2021 12/28/2021   Upper Extremity Physical Function CAT Score 29 29   Pain Interference CAT Score 22 15           12/08/2021 12/28/2021   [facility]   Upper Extremity Physical Function CAT Score 29 29   Pain Interference CAT Score 22 15       PRO Status:  Patient completed PRO.      ADMINISTRATIVE     I have personally reviewed and interpreted the images (as available).  Point-of-care ultrasound imaging is on file and stored in a permanent location (if performed).  I have personally reviewed prior records and incorporated relevant information above (as available).    MEDICAL DECISION MAKING (level of service defined by 2/3 elements)     Number/Complexity of Problems Addressed 1 stable chronic illness (99203/99213)   Amount/Complexity of Data to be Reviewed/Analyzed 3 points: Review prior notes (1 point per unique source); Review test results (1 point per unique test); Order tests (1 point per unique test); Assessment requiring an independent historian (99204/99214)   Risk of Complications/Morbidity/Mortality of Management Physical Therapy/Occupational Therapy (99203/99213)     TIME     Total Time for E/M Services on the Date of Encounter Time-based coding not utilized for this encounter     CONSULTATION     Consultation services provided No     MODIFIER -25     Significant, Separately Billable Evaluation and Management No       PROCEDURES     Procedures     DME     DME ORDER:  Dx:  ,

## 2022-01-02 DIAGNOSIS — D72819 Decreased white blood cell count, unspecified: Principal | ICD-10-CM

## 2022-01-02 DIAGNOSIS — Z940 Kidney transplant status: Principal | ICD-10-CM

## 2022-01-02 DIAGNOSIS — D849 Immunodeficiency, unspecified: Principal | ICD-10-CM

## 2022-01-03 ENCOUNTER — Ambulatory Visit: Admit: 2022-01-03 | Discharge: 2022-01-04 | Payer: PRIVATE HEALTH INSURANCE

## 2022-01-03 LAB — RENAL FUNCTION PANEL
ALBUMIN: 4 g/dL (ref 3.4–5.0)
ANION GAP: 9 mmol/L (ref 5–14)
BLOOD UREA NITROGEN: 20 mg/dL (ref 9–23)
BUN / CREAT RATIO: 11
CALCIUM: 9.2 mg/dL (ref 8.7–10.4)
CHLORIDE: 105 mmol/L (ref 98–107)
CO2: 26 mmol/L (ref 20.0–31.0)
CREATININE: 1.84 mg/dL — ABNORMAL HIGH
EGFR CKD-EPI (2021) MALE: 48 mL/min/{1.73_m2} — ABNORMAL LOW (ref >=60)
GLUCOSE RANDOM: 125 mg/dL (ref 70–179)
PHOSPHORUS: 3.2 mg/dL (ref 2.4–5.1)
POTASSIUM: 3.5 mmol/L (ref 3.4–4.8)
SODIUM: 140 mmol/L (ref 135–145)

## 2022-01-03 LAB — CBC W/ AUTO DIFF
BASOPHILS ABSOLUTE COUNT: 0 10*9/L (ref 0.0–0.1)
BASOPHILS RELATIVE PERCENT: 1 %
EOSINOPHILS ABSOLUTE COUNT: 0.1 10*9/L (ref 0.0–0.5)
EOSINOPHILS RELATIVE PERCENT: 4.8 %
HEMATOCRIT: 35.6 % — ABNORMAL LOW (ref 39.0–48.0)
HEMOGLOBIN: 12.5 g/dL — ABNORMAL LOW (ref 12.9–16.5)
LYMPHOCYTES ABSOLUTE COUNT: 0.8 10*9/L — ABNORMAL LOW (ref 1.1–3.6)
LYMPHOCYTES RELATIVE PERCENT: 43.9 %
MEAN CORPUSCULAR HEMOGLOBIN CONC: 35.2 g/dL (ref 32.0–36.0)
MEAN CORPUSCULAR HEMOGLOBIN: 32.7 pg — ABNORMAL HIGH (ref 25.9–32.4)
MEAN CORPUSCULAR VOLUME: 92.9 fL (ref 77.6–95.7)
MEAN PLATELET VOLUME: 7.4 fL (ref 6.8–10.7)
MONOCYTES ABSOLUTE COUNT: 0.3 10*9/L (ref 0.3–0.8)
MONOCYTES RELATIVE PERCENT: 16.8 %
NEUTROPHILS ABSOLUTE COUNT: 0.6 10*9/L — ABNORMAL LOW (ref 1.8–7.8)
NEUTROPHILS RELATIVE PERCENT: 33.5 %
PLATELET COUNT: 193 10*9/L (ref 150–450)
RED BLOOD CELL COUNT: 3.83 10*12/L — ABNORMAL LOW (ref 4.26–5.60)
RED CELL DISTRIBUTION WIDTH: 13.6 % (ref 12.2–15.2)
WBC ADJUSTED: 1.7 10*9/L — ABNORMAL LOW (ref 3.6–11.2)

## 2022-01-03 LAB — SLIDE REVIEW

## 2022-01-03 MED ADMIN — belatacept (NULOJIX) 425 mg in sodium chloride (NS) 0.9 % 100 mL IVPB: 5 mg/kg | INTRAVENOUS | @ 14:00:00 | Stop: 2022-01-03

## 2022-01-03 NOTE — Unmapped (Signed)
1610 Patient arrived to the Transplant Infusion Room today for belatacept 425 mg, Condition: well; Mobility: ambulating; accompanied by self.   See Flowsheet and MAR for all details of visit.   9604  VS stable.  0930 PIV placed and secured with coban; labs collected and sent, urine no orders found.  0931 Infusion initiated. Patient educated to notify nursing staff if they experience urticaria, erythema, nausea, shortness of breath, nausea, metal taste in mouth, excessive oral or postnasal drainage and any other changes in status which could be side effects from initiating this medication.  1005 Infusion complete.   1005 VS stable, Line flushed with NS, PIV removed and secured with coban. Patient left clinic today, Condition: well; Mobility: ambulating; accompanied by self.

## 2022-01-16 MED ORDER — ALPRAZOLAM 0.5 MG TABLET
ORAL_TABLET | Freq: Two times a day (BID) | ORAL | 0 refills | 5 days | Status: CP | PRN
Start: 2022-01-16 — End: ?

## 2022-01-16 NOTE — Unmapped (Signed)
Patient left a message requesting the following medication.    Patient is requesting the following refill  Requested Prescriptions     Pending Prescriptions Disp Refills    ALPRAZolam (XANAX) 0.5 MG tablet 10 tablet 0     Sig: Take 1 tablet (0.5 mg total) by mouth two (2) times a day as needed for anxiety.       Order pended. Please advise. Thanks    Last OV: 11/01/2021   Next OV: Visit date not found

## 2022-01-17 NOTE — Unmapped (Signed)
[facility] Refill Coordination Note    Specialty Medication(s) to be Shipped:   Transplant: Prednisone 5mg     Other medication(s) to be shipped: omeprazole 20 MG,losartan 50 MG,aspirin 81 MG,amLODIPine 5 MG     Ryan Moon, DOB: 10-12-1985  Phone: [phone] (home)       All above HIPAA information was verified with patient.     Was a Nurse, learning disability used for this call? No    Completed refill call assessment today to schedule patient's medication shipment from the [facility] [phone]).  All relevant notes have been reviewed.     Specialty medication(s) and dose(s) confirmed: Regimen is correct and unchanged.   Changes to medications: Dawn reports no changes at this time.  Changes to insurance: No  New side effects reported not previously addressed with a pharmacist or physician: None reported  Questions for the pharmacist: No    Confirmed patient received a Conservation officer, historic buildings and a Surveyor, mining with first shipment. The patient will receive a drug information handout for each medication shipped and additional FDA Medication Guides as required.       DISEASE/MEDICATION-SPECIFIC INFORMATION        N/A    SPECIALTY MEDICATION ADHERENCE     Medication Adherence    Patient reported X missed doses in the last month: 0  Specialty Medication: predniSONE 5 MG tablet (DELTASONE)  Patient is on additional specialty medications: No                                Were doses missed due to medication being on hold? No    predniSONE 5 mg: unsure days of medicine on hand     REFERRAL TO PHARMACIST     Referral to the pharmacist: Not needed      [facility]     Shipping address confirmed in Epic.     Delivery Scheduled: Yes, Expected medication delivery date: 01/22/22.     Medication will be delivered via UPS to the prescription address in Epic WAM.    Ernestine Mcmurray   [facility] Specialty Technician

## 2022-01-19 MED FILL — ASPIRIN 81 MG TABLET,DELAYED RELEASE: ORAL | 30 days supply | Qty: 30 | Fill #10

## 2022-01-19 MED FILL — LOSARTAN 50 MG TABLET: ORAL | 30 days supply | Qty: 60 | Fill #8

## 2022-01-19 MED FILL — OMEPRAZOLE 20 MG CAPSULE,DELAYED RELEASE: ORAL | 30 days supply | Qty: 30 | Fill #5

## 2022-01-19 MED FILL — AMLODIPINE 5 MG TABLET: ORAL | 30 days supply | Qty: 30 | Fill #8

## 2022-01-19 MED FILL — PREDNISONE 5 MG TABLET: ORAL | 30 days supply | Qty: 30 | Fill #8

## 2022-01-21 ENCOUNTER — Encounter: Payer: Self-pay | Admitting: Emergency Medicine

## 2022-01-21 ENCOUNTER — Other Ambulatory Visit: Payer: Self-pay

## 2022-01-21 ENCOUNTER — Emergency Department: Payer: BLUE CROSS/BLUE SHIELD

## 2022-01-21 ENCOUNTER — Emergency Department
Admission: EM | Admit: 2022-01-21 | Discharge: 2022-01-21 | Disposition: A | Discharge status: Home / Self Care | Payer: BLUE CROSS/BLUE SHIELD | Attending: Emergency Medicine | Admitting: Emergency Medicine

## 2022-01-21 DIAGNOSIS — I120 Hypertensive chronic kidney disease with stage 5 chronic kidney disease or end stage renal disease: Secondary | ICD-10-CM | POA: Diagnosis not present

## 2022-01-21 DIAGNOSIS — S81812A Laceration without foreign body, left lower leg, initial encounter: Secondary | ICD-10-CM | POA: Diagnosis not present

## 2022-01-21 DIAGNOSIS — Z992 Dependence on renal dialysis: Secondary | ICD-10-CM | POA: Diagnosis not present

## 2022-01-21 DIAGNOSIS — N186 End stage renal disease: Secondary | ICD-10-CM | POA: Diagnosis not present

## 2022-01-21 DIAGNOSIS — W2209XA Striking against other stationary object, initial encounter: Secondary | ICD-10-CM | POA: Diagnosis not present

## 2022-01-21 DIAGNOSIS — S8992XA Unspecified injury of left lower leg, initial encounter: Secondary | ICD-10-CM | POA: Diagnosis present

## 2022-01-21 MED ORDER — LIDOCAINE-EPINEPHRINE (PF) 2 %-1:200000 IJ SOLN
20.0000 mL | Freq: Once | INTRAMUSCULAR | Status: AC
  Administered 2022-01-21: 20 mL via INTRADERMAL
  Filled 2022-01-21: qty 20

## 2022-01-21 NOTE — Discharge Instructions (Signed)
You were evaluated in the emergency department for a laceration. It was repaired with sutures. Keep the area clean and dry.  Wash multiple times per day with soap and water.  Do not go into the ocean or swimming pool.  Return to the emergency department or your primary care physician's office in 7-10 days for suture removal.  Return to the emergency department for:  -- Fever > 100.12F -- Increase pain in the wound -- Increase redness and swelling -- Pus coming from the wound -- Wound bleeds more than a small amount or it does not stop -- Wound edges come apart -- Severe pain -- Weakness or numbness in the affected area  Or any other new or worsening symptoms. It was a pleasure caring for you.

## 2022-01-21 NOTE — ED Triage Notes (Signed)
Pt has laceration to the right lower leg. Pt reports he tripped and hit his left leg on a metal table. Bleeding controlled.

## 2022-01-21 NOTE — ED Provider Notes (Cosign Needed Addendum)
[facility] Provider Note    Event Date/Time   First MD Initiated Contact with Patient 01/21/22 1926     (approximate)   History   Laceration   HPI  Jeffrey Holloway is a 36 y.o. male with a past medical history of end-stage renal disease on dialysis, who presents today for evaluation of laceration.  Patient reports that he tripped and sliced his left leg on a metal table.  He denies fall to ground.  He reports that he has a large laceration to his leg prompting him to come in for evaluation.  He reports that his tetanus vaccination is up-to-date.  Denies paresthesias.  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 --      Excl. in [place]? --     Most recent vital signs: Vitals:   01/21/22 1832  BP: (!) 136/91  Pulse: 75  Resp: 17  Temp: 98.2 F (36.8 C)  SpO2:  97%    Physical Exam Vitals and nursing note reviewed.  Constitutional:      General: Awake and alert. No acute distress.    Appearance: Normal appearance. The patient is normal weight.  HENT:     Head: Normocephalic and atraumatic.     Mouth: Mucous membranes are moist.  Eyes:     General: PERRL. Normal EOMs        Right eye: No discharge.        Left eye: No discharge.     Conjunctiva/sclera: Conjunctivae normal.  Cardiovascular:     Rate and Rhythm: Normal rate and regular rhythm.     Pulses: Normal pulses.     Heart sounds: Normal heart sounds Pulmonary:     Effort: Pulmonary effort is normal. No respiratory distress.     Breath sounds: Normal breath sounds.  Abdominal:     Abdomen is soft. There is no abdominal tenderness. No rebound or guarding. No distention. Musculoskeletal:        General: No swelling. Normal range of motion.     Cervical back: Normal range of motion and neck supple.  10 cm curvilinear laceration  to left lower extremity over the anterior shin.  There is visible subcutaneous fatty tissue and a skin flap.  No visible foreign body.  Patient has full and normal range of motion of knee and ankle.  Normal pedal pulses.  Sensation intact light touch throughout. Skin:    General: Skin is warm and dry.     Capillary Refill: Capillary refill takes less than 2 seconds.     Findings: No rash.  Neurological:     Mental Status: The patient is awake and alert.      ED Results / Procedures / Treatments   Labs (all labs ordered are listed, but only abnormal results are displayed) Labs Reviewed - No data to display   EKG     RADIOLOGY I independently reviewed and interpreted imaging and agree with radiologists findings.     PROCEDURES:  Critical Care performed:   Marland KitchenMarland KitchenLaceration Repair  Date/Time: 01/21/2022 10:10 PM  Performed by: Marquette Old, PA-C Authorized by: Marquette Old, PA-C   Consent:    Consent obtained:  Verbal   Consent given by:   Patient   Risks, benefits, and alternatives were discussed: yes     Risks discussed:  Infection, need for additional repair, nerve damage, poor wound healing, poor cosmetic result, pain, retained foreign body, tendon damage and vascular damage   Alternatives discussed:  No treatment and delayed treatment Universal protocol:    Procedure explained and questions answered to patient or proxy's satisfaction: yes     Relevant documents present and verified: yes     Test results available: yes     Imaging studies available: yes     Required blood products, implants, devices, and special equipment available: yes     Site/side marked: yes     Immediately prior to procedure, a time out was called: yes     Patient identity confirmed:  Verbally with patient Anesthesia:    Anesthesia method:  Local infiltration   Local anesthetic:  Lidocaine 1% WITH epi Laceration details:    Location:  Leg   Leg location:  L lower leg   Length (cm):  10   Depth (mm):  5 Pre-procedure details:    Preparation:  Patient was prepped and draped in usual sterile fashion and imaging obtained to evaluate for foreign bodies Exploration:    Limited defect created (wound extended): yes     Hemostasis achieved with:  Direct pressure   Imaging obtained: x-ray     Imaging outcome: foreign body not noted     Wound exploration: wound explored through full range of motion and entire depth of wound visualized     Wound extent: no foreign bodies/material noted, no muscle damage noted, no nerve damage noted, no tendon damage noted, no underlying fracture noted and no vascular damage noted   Treatment:    Area cleansed with:  Saline   Amount of cleaning:  Extensive   Irrigation solution:  Sterile saline   Irrigation volume:  2L   Irrigation method:  Syringe and pressure wash   Debridement:  None   Undermining:  None   Scar revision: no     Layers/structures repaired:  Deep subcutaneous Deep subcutaneous:    Suture size:   4-0   Suture material:  Chromic gut   Subcutaneous suture technique: subcuticular. Skin repair:    Repair method:  Sutures   Suture size:  4-0   Suture material:  Nylon   Suture technique:  Simple interrupted   Number  of sutures:  18 Repair type:    Repair type:  Complex Post-procedure details:    Dressing:  Non-adherent dressing   Procedure completion:  Tolerated well, no immediate complications    MEDICATIONS ORDERED IN ED: Medications  lidocaine-EPINEPHrine (XYLOCAINE W/EPI) 2 %-1:200000 (PF) injection 20 mL (20 mLs Intradermal Given by Other 01/21/22 1946)     IMPRESSION / MDM / ASSESSMENT AND PLAN / ED COURSE  I reviewed the triage vital signs and the nursing notes.   Differential diagnosis includes, but is not limited to, laceration, retained foreign body, fracture.  X-ray was obtained demonstrates no acute osseous injury or retained foreign body.  Wound was anesthetized and irrigated extensively.  It was closed with sutures with a double layer closure.  Patient was educated on suture care and timeline for removal.  We discussed return precautions.  Tdap already up-to-date.  Patient understands and agrees with plan.  Discharged in stable condition.  Ambulatory with a stable gait.  Patient's presentation is most consistent with acute complicated illness / injury requiring diagnostic workup.     FINAL CLINICAL IMPRESSION(S) / ED DIAGNOSES   Final diagnoses:  Laceration of left lower extremity, initial encounter     Rx / DC Orders   ED Discharge Orders     None        Note:  This document was prepared using Dragon voice recognition software and may include unintentional dictation errors.   Marquette Old, PA-C 01/21/22 2216    PoggiClarnce Flock, PA-C 01/21/22 2216    Nena Polio, MD 01/23/22 1425

## 2022-01-22 NOTE — Unmapped (Signed)
Received call from patient that he went to ED yesterday due to cutting himself on a piece of metal.  He notes a laceration to his leg that required several sutures.  Spoke with Dr Toni Arthurs who recommended patient receive Tetanus injection (previous one in 2014) and monitor for symptoms of infection. These were reviewed with patient.    Message sent to schedulers to arrange nurse visit for TDAP for patient.

## 2022-01-23 ENCOUNTER — Institutional Professional Consult (permissible substitution): Admit: 2022-01-23 | Discharge: 2022-01-24 | Payer: PRIVATE HEALTH INSURANCE

## 2022-01-23 NOTE — Unmapped (Signed)
Pt seen in clinic for Tdap injection. Pt tolerated well and without difficulty.

## 2022-01-29 NOTE — Unmapped (Unsigned)
01/21/22 Laceration left lower leg after tripping and hitting leg on metal table -     Bilateral shoulder pain seen by orthopedics in July:    Pain at anterior [facility] join, no pain with LHB testing, but has considerable pain with labrum testing  Suspect SLAP tear playing a role in his current pain.  RC asymptomatic.  XR shows small calcifications in the RC, otherwise WNL.      --PT consult, HEP given in advance of PT   --activity modifications discussed  --discussed avoiding PO NSAIDs given his renal history, ok to trial voltaren.  Recommended scheduling tylenol q8h prn for pain control.      Kidney transplant recipient  Assessment & Plan:  Creatinine is not ideal however has been stable past few years.  Labs are regularly monitored with each infusion of Nulojix.  Would be nice to see patient come off prednisone at some point.  We will need to monitor blood sugars, bone health and discuss stress dosing with infection.     Bilateral hip pain  -     Ambulatory referral to Orthopedic Surgery; Future     Lesion of nose  -     Ambulatory referral to Dermatology; Future     Essential hypertension  At goal on current regimen.      Gastroesophageal reflux disease without esophagitis  Patient stable on omeprazole.  At some point would benefit from getting an upper endoscopy for further evaluation.  Also consider checking Helicobacter pylori.        Persistent adjustment disorder with mixed anxiety and depressed mood  It sounds like patient's symptoms with his mood started after he developed kidney failure and also complications with his kidney transplant.  Although his health is stable right now his symptoms do remain.  He has tapered down on alprazolam and only takes 2-3 times per week.  Previously he was taking it every day as prevention.  Patient says he did not tolerate SSRI with buspirone.  He said it made him feel very sluggish and down.  I am okay prescribing a limited number of alprazolam per month #10 with refills as needed by request.  Discussed possibility of counseling which patient had during his transplantation.  He feels like he would not benefit from more.  Consider trial of a different SSRI in the future if symptoms worsen.  Patient understands the habit-forming nature of benzodiazepines.  Requested that he keep his medications locked up somewhere safe as these are drugs of abuse.        Vaccine counseling  Patient not interested in coronavirus vaccine.  Will consider meningococcal vaccine.  Will send message to patient's nephrologist to see if he feels it is clinically indicated.        Low vitamin D level  Given potential for bone loss on chronic prednisone would like to see patient's vitamin D slightly higher.  Will recommend vitamin D 1000 IU daily if okay with patient's nephrologist.  Await reply.      Labs checked monthly at each infusion.  Continue healthy diet and exercise.  Consider COVID-vaccine.  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  Likely will be due in 5 years.  Patient has full set of upper dentures and partial on the bottom.  Recommend he be seen by a dentist at some point.  Its been several years since he has been seen by dentist.  Recommend skin check yearly.  Referral placed for [place] [facility].

## 2022-01-31 ENCOUNTER — Ambulatory Visit: Admit: 2022-01-31 | Payer: PRIVATE HEALTH INSURANCE | Attending: Geriatric Medicine | Primary: Geriatric Medicine

## 2022-01-31 ENCOUNTER — Ambulatory Visit
Admit: 2022-01-31 | Discharge: 2022-01-31 | Disposition: A | Discharge status: Home / Self Care | Payer: PRIVATE HEALTH INSURANCE | Attending: Emergency Medicine

## 2022-01-31 DIAGNOSIS — L03116 Cellulitis of left lower limb: Principal | ICD-10-CM

## 2022-01-31 MED ORDER — CEPHALEXIN 500 MG CAPSULE
ORAL_CAPSULE | Freq: Four times a day (QID) | ORAL | 0 refills | 10 days | Status: CP
Start: 2022-01-31 — End: 2022-02-10

## 2022-01-31 NOTE — Unmapped (Signed)
Pt is transplant pt, sutures to LLE was supposed to be removed today but concern for infection redness and swelling

## 2022-02-01 NOTE — Unmapped (Signed)
ED Procedure Note    Suture Removal    Date/Time: 01/31/2022 6:37 PM    Performed by: Jesse Fall, MD  Authorized by: Bryson Ha, MD    Consent:     Consent obtained:  Verbal    Consent given by:  Patient    Risks, benefits, and alternatives were discussed: yes      Risks discussed:  Bleeding and pain    Alternatives discussed:  No treatment  Universal protocol:     Patient identity confirmed:  Verbally with patient and arm band  Location:     Location:  Lower extremity  Procedure details:     Wound appearance:  Red and tender    Number of sutures removed:  10  Post-procedure details:     Post-removal:  No dressing applied    Procedure completion:  Tolerated well, no immediate complications

## 2022-02-01 NOTE — Unmapped (Signed)
[facility]  Emergency Department Provider Note     ED Clinical Impression     Final diagnoses:   Cellulitis of left leg (Primary)      Impression, Medical Decision Making, ED Course     Impression: 36 y.o. 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    On exam, repaired linear laceration 2.5 inches with surrounding erythema.  Mildly tender to palpation.  No fluctuance, no purulent drainage.    ED Course as of 01/31/22 1842   Wed Jan 31, 2022   2130 Initial clinical impression is for cellulitis of left lower extremity in setting of recent laceration repair.  He has no systemic symptoms to include fever/chills and states that he feels well other than the leg.  Though patient is immunocompromise, he has stable vitals upon arrival to the emergency department without any evidence of hemodynamic instability/compromise or bacteremia.  No purulent drainage and no palpable fluid collection to suggest abscess, patient also denies any history of MRSA.    Sutures were successfully removed without incident in the emergency department, see separate procedure note.  Patient was provided with prescription for Keflex, did offer first dose in the emergency department, though the patient declined, and will plan to go straight to the pharmacy to pick them up.  Given strict return precautions to include worsening pain, redness, swelling, or any fever/chills.  Discharged in stable condition.     ____________________________________________    The case was discussed with the attending physician, who is in agreement with the above assessment and plan.      History     Chief Complaint  Chief Complaint   Patient presents with    Wound Check       HPI   Ryan Moon is a 36 y.o. male with past medical history as below who presents with one day of redness, swelling, and throbbing pain to a sutured laceration on his LLE sustained after a mechanical fall. He recently received a kidney transplant. He had his laceration repaired on 8/20 at OSH and came here for evaluation and removal of his stitches. He is UTD on tetanus. He denies history of MRSA. He denies drainage.     Outside Historian(s): I have obtained additional history/collateral from N/A.    External Records Reviewed: I have reviewed recent and relevant previous record, including: External ED note - 01/21/22 where patient's laceration to the LLE was repaired with sutures.     Past Medical History:   Diagnosis Date    Renal vasculitis (CMS-HCC)     Secondary hyperparathyroidism (CMS-HCC)        Past Surgical History:   Procedure Laterality Date    APPENDECTOMY  2013    NEPHRECTOMY TRANSPLANTED ORGAN      PR COLSC FLX W/RMVL OF TUMOR POLYP LESION SNARE TQ N/A 07/07/2021    Procedure: COLONOSCOPY FLEX; W/REMOV TUMOR/LES BY SNARE;  Surgeon: Maris Berger, MD;  [facility];  Service: Gastroenterology    PR TRANSPLANT,PREP CADAVER RENAL GRAFT Right 04/28/2019    Procedure: [facility] STD PREP CAD DONR RENAL ALLOGFT PRIOR TO TRNSPLNT, INCL DISSEC/REM PERINEPH FAT, DIAPH/RTPER ATTAC;  Surgeon: Doyce Loose, MD;  Location: MAIN OR [place];  Service: Transplant    PR TRANSPLANTATION OF KIDNEY Right 04/28/2019  Procedure: RENAL ALLOTRANSPLANTATION, IMPLANTATION OF GRAFT; WITHOUT RECIPIENT NEPHRECTOMY;  Surgeon: Doyce Loose, MD;  Location: MAIN OR [facility];  Service: Transplant       No current facility-administered medications for this encounter.    Current Outpatient Medications:     ALPRAZolam (XANAX) 0.5 MG tablet, Take 1 tablet (0.5 mg total) by mouth two (2) times a day as needed for anxiety., Disp: 10 tablet, Rfl: 0    amLODIPine (NORVASC) 5 MG tablet, Take 1 tablet (5 mg total) by mouth every evening., Disp: 30 tablet, Rfl: 11    aspirin (ECOTRIN) 81 MG tablet, Take 1 tablet (81 mg total) by mouth daily., Disp: 30 tablet, Rfl: 11    belatacept (NULOJIX) 250 mg SolR, Belatacept  5 mg/kg every 2 weeks for 5 doses then once every 4 weeks, Disp: 14.5 mL, Rfl: 0    cephalexin (KEFLEX) 500 MG capsule, Take 1 capsule (500 mg total) by mouth four (4) times a day for 10 days., Disp: 40 capsule, Rfl: 0    losartan (COZAAR) 50 MG tablet, Take 1 tablet (50 mg total) by mouth Two (2) times a day., Disp: 60 tablet, Rfl: 11    magnesium oxide-Mg AA chelate (MAGNESIUM, AMINO ACID CHELATE,) 133 mg, Take 1 tablet by mouth daily., Disp: 90 tablet, Rfl: 3    omeprazole (PRILOSEC) 20 MG capsule, Take 1 capsule (20 mg total) by mouth daily., Disp: 30 capsule, Rfl: 11    ondansetron (ZOFRAN-ODT) 4 MG disintegrating tablet, Take 1 tablet (4 mg total) by mouth every eight (8) hours as needed., Disp: , Rfl:     predniSONE (DELTASONE) 5 MG tablet, Take 1 tablet (5 mg total) by mouth daily., Disp: 30 tablet, Rfl: 11    Allergies  Ibuprofen and Acetaminophen    Family History  Family History   Problem Relation Age of Onset    Kidney disease Neg Hx        Social History  Social History     Tobacco Use    Smoking status: Former     Types: Cigarettes     Quit date: 05/22/2018     Years since quitting: 3.6    Smokeless tobacco: Former     Types: Catering manager Use: Never used   Substance Use Topics    Alcohol use: Yes     Alcohol/week: 1.0 standard drink of alcohol     Types: 1 Cans of beer per week     Comment: Patient had a DUI at age 48. Until 5/16 patient would have 1-2 beers per day.     Drug use: Yes     Types: Marijuana     Comment: Patient noted that prior to 2017, patient snorted cocaine once every 2-4 months for several years; patient has denied use since 2107. Patient also reports having smoked  marijuana every couple of months        Physical Exam     VITAL SIGNS:      Vitals:    01/31/22 1642 01/31/22 1645   BP:  134/95   Pulse: 98 81   Resp:  18   Temp: 36.7 ??C (98.1 ??F)   TempSrc:  Oral   SpO2: 99% 98%   Weight:  83.9 kg (185 lb)   Height:  182.9 cm (6')       Constitutional: Alert and oriented. No acute distress.  Eyes: Conjunctivae are normal.  HEENT: Normocephalic and atraumatic. Conjunctivae clear. No congestion.  Moist mucous membranes.   Cardiovascular: Rate as above, regular rhythm. Normal and symmetric distal pulses. Brisk capillary refill. Normal skin turgor.  Respiratory: Normal respiratory effort. Breath sounds are normal. There are no wheezing or crackles heard.  Gastrointestinal: Soft, non-distended, non-tender.  Genitourinary: Deferred.  Musculoskeletal: Non-tender with normal range of motion in all extremities.  Neurologic: Normal speech and language. No gross focal neurologic deficits are appreciated. Patient is moving all extremities equally, face is symmetric at rest and with speech.  Skin: Repaired linear laceration 2.5 inches with surrounding erythema.  No purulent drainage. Skin is warm, dry and intact. No rash noted.    Psychiatric: Mood and affect are normal. Speech and behavior are normal.     Radiology     No orders to display       Pertinent labs & imaging results that were available during my care of the patient were independently interpreted by me and considered in my medical decision making (see chart for details).    Portions of this record have been created using Scientist, clinical (histocompatibility and immunogenetics). Dictation errors have been sought, but may not have been identified and corrected.    Attestations     Documentation assistance was provided by Mohammed Kindle, Scribe on January 31, 2022 at 6:30 PM for Ames Dura, MD.     Documentation assistance provided by the above mentioned scribe. I was present during the time the encounter was recorded. The information recorded by the scribe was done at my direction and has been reviewed and validated by me.          Jesse Fall, MD  Resident  01/31/22 [phone]

## 2022-02-01 NOTE — Unmapped (Signed)
Patient states he woke up yesterday and noted swelling and redness to lower extremity wound. He went to ED and was prescribed Keflex.  He already notices improvement in lower extremity redness and swelling today.  He also states that the sutures were removed.

## 2022-02-01 NOTE — Unmapped (Signed)
Bed: 48-C  Expected date:   Expected time:   Means of arrival:   Comments:

## 2022-02-02 DIAGNOSIS — D849 Immunodeficiency, unspecified: Principal | ICD-10-CM

## 2022-02-02 DIAGNOSIS — D72819 Decreased white blood cell count, unspecified: Principal | ICD-10-CM

## 2022-02-02 DIAGNOSIS — Z940 Kidney transplant status: Principal | ICD-10-CM

## 2022-02-06 ENCOUNTER — Ambulatory Visit
Admit: 2022-02-06 | Payer: PRIVATE HEALTH INSURANCE | Attending: Rehabilitative and Restorative Service Providers" | Primary: Rehabilitative and Restorative Service Providers"

## 2022-02-06 NOTE — Unmapped (Unsigned)
[facility] HEALTH SCIENCES PT [facility]  OUTPATIENT PHYSICAL THERAPY  02/06/2022          Patient Name: Ryan Moon  Date of Birth:May 24, 1986  Diagnosis: No diagnosis found.  Referring MD:  Ryan Moon*     Visit #: 1    Date of Onset of Impairment-No date available  Date PT Care Plan Established or Reviewed-No date available  Date PT Treatment Started-No date available   Plan of Care Effective Date:          Assessment/Plan:    Assessment  Assessment details:    36 y.o. year old male presents with {Left/Right/Bilat:46051} *** which is limiting their ability to perform the functional activities listed below.  Based on patient's presentation in clinic today, signs and symptoms appear including *** to be consistent with ***.  This patient requires skilled physical therapy services to address the outlined impairments in order to return to their desired level of function.          Impairments: pain, joint restriction, impaired flexibility, decreased strength, decreased range of motion, gait deviation, impaired ADLs and poor awareness of body mechanics                      Therapy Goals      Goals:      Goals:??  Short-Term Goals   In 6 weeks ***  1. The pt ??will demonstrate *** ??independent performance of HEP to maintain functional gains.   2. The pt ??will demonstrate ***    Long-Term Goals   In 12 weeks ***  1. The pt will demonstrate independent performance of HEP to maintain functional gains.   2. Patient to report return to *** with pain </= ***/10 to demo improved tolerance to previous activities.  3. The patient will improve in *** score to help patient demonstrate improvement in overall function.      Plan    Therapy options: will be seen for skilled physical therapy services    Planned therapy interventions: manual therapy, body mechanics training, therapeutic activities, therapeutic exercises, postural training, neuromuscular re-education, education - patient and home exercise program        Education provided to: patient.    Education provided: anatomy, body mechanics, importance of Therapy and HEP                  Subjective:   History of Present Condition          Subjective:     Ryan Moon reports to outpatient physical therapy with a *** history of ***. Mechanism of injury is ***.     Previous treatment: ***  Since onset, symptoms are ***.     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)  No date: Secondary hyperparathyroidism (CMS-HCC)   Past Surgical History:  2013: APPENDECTOMY  No date: NEPHRECTOMY TRANSPLANTED ORGAN  07/07/2021: PR COLSC FLX W/RMVL OF TUMOR POLYP LESION SNARE TQ; N/A      Comment:  Procedure: COLONOSCOPY FLEX; W/REMOV TUMOR/LES BY SNARE;  Surgeon: Ryan Berger, MD;  Location: GI PROCEDURES                [facility];  Service: Gastroenterology  04/28/2019: Ryan Moon; Right      Comment:  Procedure: BACKBNCH STD PREP CAD DONR RENAL ALLOGFT                PRIOR TO TRNSPLNT, INCL DISSEC/REM PERINEPH FAT,                DIAPH/RTPER ATTAC;  Surgeon: Ryan Loose, MD;               Location: MAIN OR [place];  Service: Transplant  04/28/2019: PR TRANSPLANTATION OF KIDNEY; Right      Comment:  Procedure: RENAL ALLOTRANSPLANTATION, IMPLANTATION OF                Moon; WITHOUT RECIPIENT NEPHRECTOMY;  Surgeon: Ryan Loose, MD;  Location: MAIN OR [facility];  Service:               Transplant   Patient Active Problem List:     Essential hypertension     Gastroesophageal reflux disease without esophagitis     Kidney transplant 04/28/2019     Immunosuppression (CMS-HCC)     Leukopenia     Stage 3a chronic kidney disease (CMS-HCC)     Right lower quadrant pain     Microscopic hematuria     Hordeolum external     Bright red rectal bleeding     Hemorrhoids     Colon polyps     Chronic pain of both shoulders     Trochanteric bursitis of both hips     Persistent adjustment disorder with mixed anxiety and depressed mood     Vaccine counseling     Low vitamin D level     Lesion of nose     Healthcare maintenance     Current Outpatient Medications:  ALPRAZolam (XANAX) 0.5 MG tablet, Take 1 tablet (0.5 mg total) by mouth two (2) times a day as needed for anxiety., Disp: 10 tablet, Rfl: 0  amLODIPine (NORVASC) 5 MG tablet, Take 1 tablet (5 mg total) by mouth every evening., Disp: 30 tablet, Rfl: 11  aspirin (ECOTRIN) 81 MG tablet, Take 1 tablet (81 mg total) by mouth daily., Disp: 30 tablet, Rfl: 11  belatacept (NULOJIX) 250 mg SolR, Belatacept  5 mg/kg every 2 weeks for 5 doses then once every 4 weeks, Disp: 14.5 mL, Rfl: 0  cephalexin (KEFLEX) 500 MG capsule, Take 1 capsule (500 mg total) by mouth four (4) times a day for 10 days., Disp: 40 capsule, Rfl: 0  losartan (COZAAR) 50 MG tablet, Take 1 tablet (50 mg total) by mouth Two (2) times a day., Disp: 60 tablet, Rfl: 11  magnesium oxide-Mg AA chelate (MAGNESIUM, AMINO ACID CHELATE,) 133 mg, Take 1 tablet by mouth daily., Disp: 90 tablet, Rfl: 3  omeprazole (PRILOSEC) 20 MG capsule, Take 1 capsule (20 mg total) by mouth daily., Disp: 30 capsule, Rfl: 11  ondansetron (ZOFRAN-ODT) 4 MG disintegrating tablet, Take 1 tablet (4 mg total) by mouth every eight (8) hours as needed., Disp: , Rfl:   predniSONE (DELTASONE) 5 MG tablet, Take 1 tablet (5 mg total) by mouth daily., Disp: 30 tablet, Rfl: 11    No current facility-administered medications for this visit.  Precautions and Equipment  Precautions: None  Current Braces/Orthoses: None  Equipment Currently Used: None        Treatments  Current treatment: physical therapy          Objective:   Objective    Functional Test/Outcome Measures:  SPADI/QuickDASH: ***    Posture/Observations:   Sitting: rounded shoulders, forward head and increased thoracic kyphosis  Standing: forward flexed posture, rounded shoulders, forward head and increased thoracic kyphosis    Cervical ROM Screen:  ***    Special Tests/Clearing Screens:     Special Test Result   Neer Impingement {SVpositive:48465::Not tested}   Painful Arc {SVpositive:48465::Not tested}   Empty can {SVpositive:48465::Not tested}   Leanord Asal {SVpositive:48465::Not tested}   Pain provocation with resisted ER {SVpositive:48465::Not tested}   Drop arm test {SVpositive:48465::Not tested}   ER Lag sign {SVpositive:48465::Not tested}   Scapular assistance {SVpositive:48465::Not tested}   Scapular Resisted {SVpositive:48465::Not tested}   Speed's {SVpositive:48465::Not tested}   Apprehension-relocation {SVpositive:48465::Not tested}   Bicep load {SVpositive:48465::Not tested}   O'Brien's {SVpositive:48465::Not tested}   Lift Off {SVpositive:48465::Not tested}   Bear hug {SVpositive:48465::Not tested}     Palpation/Segmental Motion/Joint Play:   - TTP: ***  - Mobility: ***    Sensation: ***    Range of Motion/Flexibility:   Motion Active Right Active Left Passive Right Passive Left   Shoulder Flexion ***      Shoulder Abduction       Shoulder IR at ***       Functional IR       Shoulder ER at ***       Functional ER           Strength/MMT:   UE MMT   UE MMT Right Left   Shoulder elevation: {sv_MMT:49440::WFL} {sv_MMT:49440::WFL}   Shoulder IR: {sv_MMT:49440::WFL} {sv_MMT:49440::WFL}   Shoulder ER: {sv_MMT:49440::WFL} {sv_MMT:49440::WFL}   Shoulder abduction {sv_MMT:49440::WFL} {sv_MMT:49440::WFL}       Evaluation: ***  Therex: ***    Total treatment time: ***                              I attest that I have reviewed the above information.  Signed: Vertis Kelch, PT  02/06/2022 7:55 AM

## 2022-02-07 ENCOUNTER — Ambulatory Visit: Admit: 2022-02-07 | Discharge: 2022-02-07 | Payer: PRIVATE HEALTH INSURANCE

## 2022-02-07 LAB — CBC W/ AUTO DIFF
BASOPHILS ABSOLUTE COUNT: 0 10*9/L (ref 0.0–0.1)
BASOPHILS RELATIVE PERCENT: 1 %
EOSINOPHILS ABSOLUTE COUNT: 0.1 10*9/L (ref 0.0–0.5)
EOSINOPHILS RELATIVE PERCENT: 4.9 %
HEMATOCRIT: 35.2 % — ABNORMAL LOW (ref 39.0–48.0)
HEMOGLOBIN: 12.4 g/dL — ABNORMAL LOW (ref 12.9–16.5)
LYMPHOCYTES ABSOLUTE COUNT: 0.7 10*9/L — ABNORMAL LOW (ref 1.1–3.6)
LYMPHOCYTES RELATIVE PERCENT: 34.4 %
MEAN CORPUSCULAR HEMOGLOBIN CONC: 35.1 g/dL (ref 32.0–36.0)
MEAN CORPUSCULAR HEMOGLOBIN: 32.8 pg — ABNORMAL HIGH (ref 25.9–32.4)
MEAN CORPUSCULAR VOLUME: 93.5 fL (ref 77.6–95.7)
MEAN PLATELET VOLUME: 7.1 fL (ref 6.8–10.7)
MONOCYTES ABSOLUTE COUNT: 0.4 10*9/L (ref 0.3–0.8)
MONOCYTES RELATIVE PERCENT: 16.6 %
NEUTROPHILS ABSOLUTE COUNT: 0.9 10*9/L — ABNORMAL LOW (ref 1.8–7.8)
NEUTROPHILS RELATIVE PERCENT: 43.1 %
PLATELET COUNT: 215 10*9/L (ref 150–450)
RED BLOOD CELL COUNT: 3.76 10*12/L — ABNORMAL LOW (ref 4.26–5.60)
RED CELL DISTRIBUTION WIDTH: 14.1 % (ref 12.2–15.2)
WBC ADJUSTED: 2.1 10*9/L — ABNORMAL LOW (ref 3.6–11.2)

## 2022-02-07 LAB — RENAL FUNCTION PANEL
ALBUMIN: 4.3 g/dL (ref 3.4–5.0)
ANION GAP: 9 mmol/L (ref 5–14)
BLOOD UREA NITROGEN: 21 mg/dL (ref 9–23)
BUN / CREAT RATIO: 12
CALCIUM: 9.6 mg/dL (ref 8.7–10.4)
CHLORIDE: 104 mmol/L (ref 98–107)
CO2: 24 mmol/L (ref 20.0–31.0)
CREATININE: 1.71 mg/dL — ABNORMAL HIGH
EGFR CKD-EPI (2021) MALE: 52 mL/min/{1.73_m2} — ABNORMAL LOW (ref >=60)
GLUCOSE RANDOM: 92 mg/dL (ref 70–99)
PHOSPHORUS: 4.4 mg/dL (ref 2.4–5.1)
POTASSIUM: 4 mmol/L (ref 3.4–4.8)
SODIUM: 137 mmol/L (ref 135–145)

## 2022-02-07 LAB — SLIDE REVIEW

## 2022-02-07 MED ADMIN — belatacept (NULOJIX) 425 mg in sodium chloride (NS) 0.9 % 100 mL IVPB: 5 mg/kg | INTRAVENOUS | @ 14:00:00 | Stop: 2022-02-07

## 2022-02-07 NOTE — Unmapped (Signed)
1610 Patient arrived to the Transplant Infusion Room today for belatacept 425 mg, Condition: well; Mobility: ambulating; accompanied by self.   See Flowsheet and MAR for all details of visit.   0945 VS stable.  9604 PIV placed and secured with coban; labs collected and sent, urine no orders found.  5409 Infusion initiated. Patient educated to notify nursing staff if they experience urticaria, erythema, nausea, shortness of breath, nausea, metal taste in mouth, excessive oral or postnasal drainage and any other changes in status which could be side effects from initiating this medication.  1028 Infusion complete.   1034 VS stable, Line flushed with NS, PIV removed and secured with coban. Patient left clinic today, Condition: well; Mobility: ambulating; accompanied by self.

## 2022-02-12 NOTE — Unmapped (Incomplete)
01/21/22 Laceration left lower leg after tripping and hitting leg on metal table -      Bilateral shoulder pain seen by orthopedics in July:    Pain at anterior [facility] join, no pain with LHB testing, but has considerable pain with labrum testing  Suspect SLAP tear playing a role in his current pain.  RC asymptomatic.  XR shows small calcifications in the RC, otherwise WNL.      --PT consult, HEP given in advance of PT   --activity modifications discussed  --discussed avoiding PO NSAIDs given his renal history, ok to trial voltaren.  Recommended scheduling tylenol q8h prn for pain control.      Kidney transplant recipient  Assessment & Plan:  Creatinine is not ideal however has been stable past few years.  Labs are regularly monitored with each infusion of Nulojix.  Would be nice to see patient come off prednisone at some point.  We will need to monitor blood sugars, bone health and discuss stress dosing with infection.     Bilateral hip pain  -     Ambulatory referral to Orthopedic Surgery; Future     Lesion of nose  -     Ambulatory referral to Dermatology; Future     Essential hypertension  At goal on current regimen.       Gastroesophageal reflux disease without esophagitis  Patient stable on omeprazole.  At some point would benefit from getting an upper endoscopy for further evaluation.  Also consider checking Helicobacter pylori.        Persistent adjustment disorder with mixed anxiety and depressed mood  It sounds like patient's symptoms with his mood started after he developed kidney failure and also complications with his kidney transplant.  Although his health is stable right now his symptoms do remain.  He has tapered down on alprazolam and only takes 2-3 times per week.  Previously he was taking it every day as prevention.  Patient says he did not tolerate SSRI with buspirone.  He said it made him feel very sluggish and down.  I am okay prescribing a limited number of alprazolam per month #10 with refills as needed by request.  Discussed possibility of counseling which patient had during his transplantation.  He feels like he would not benefit from more.  Consider trial of a different SSRI in the future if symptoms worsen.  Patient understands the habit-forming nature of benzodiazepines.  Requested that he keep his medications locked up somewhere safe as these are drugs of abuse.        Vaccine counseling  Patient not interested in coronavirus vaccine.  Will consider meningococcal vaccine.  Will send message to patient's nephrologist to see if he feels it is clinically indicated.        Low vitamin D level  Given potential for bone loss on chronic prednisone would like to see patient's vitamin D slightly higher.  Will recommend vitamin D 1000 IU daily if okay with patient's nephrologist.  Await reply.       Labs checked monthly at each infusion.  Continue healthy diet and exercise.  Consider COVID-vaccine.  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  Likely will be due in 5 years.  Patient has full set of upper dentures and partial on the bottom.  Recommend he be seen by a dentist at some point.  Its been several years since he has been seen by dentist.  Recommend skin check yearly.  Referral placed for [facility].

## 2022-02-13 NOTE — Unmapped (Unsigned)
[facility] HEALTH SCIENCES PT [facility]  OUTPATIENT PHYSICAL THERAPY  02/13/2022          Patient Name: Ryan Moon  Date of Birth:1985-12-18  Diagnosis: No diagnosis found.  Referring MD:  Drusilla Kanner*     Visit #: 1    Date of Onset of Impairment-No date available  Date PT Care Plan Established or Reviewed-No date available  Date PT Treatment Started-No date available   Plan of Care Effective Date:          Assessment/Plan:    Assessment  Assessment details:    36 y.o. year old male presents with {Left/Right/Bilat:46051} *** which is limiting their ability to perform the functional activities listed below.  Based on patient's presentation in clinic today, signs and symptoms appear including *** to be consistent with ***.  This patient requires skilled physical therapy services to address the outlined impairments in order to return to their desired level of function.          Impairments: pain, joint restriction, impaired flexibility, decreased strength, decreased range of motion, gait deviation, impaired ADLs and poor awareness of body mechanics                      Therapy Goals      Goals:      Goals:??  Short-Term Goals   In 6 weeks ***  1. The pt ??will demonstrate *** ??independent performance of HEP to maintain functional gains.   2. The pt ??will demonstrate ***    Long-Term Goals   In 12 weeks ***  1. The pt will demonstrate independent performance of HEP to maintain functional gains.   2. Patient to report return to *** with pain </= ***/10 to demo improved tolerance to previous activities.  3. The patient will improve in *** score to help patient demonstrate improvement in overall function.      Plan    Therapy options: will be seen for skilled physical therapy services    Planned therapy interventions: manual therapy, body mechanics training, therapeutic activities, therapeutic exercises, postural training, neuromuscular re-education, education - patient and home exercise program        Education provided to: patient.    Education provided: anatomy, body mechanics, importance of Therapy and HEP                  Subjective:   History of Present Condition          Subjective:     Mr. Kreitler reports to outpatient physical therapy with a *** history of ***. Mechanism of injury is ***.     Previous treatment: ***  Since onset, symptoms are ***.     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)  No date: Secondary hyperparathyroidism (CMS-HCC)   Past Surgical History:  2013: APPENDECTOMY  No date: NEPHRECTOMY TRANSPLANTED ORGAN  07/07/2021: PR COLSC FLX W/RMVL OF TUMOR POLYP LESION SNARE TQ; N/A      Comment:  Procedure: COLONOSCOPY FLEX; W/REMOV TUMOR/LES BY SNARE;  Surgeon: Maris Berger, MD;  Location: GI PROCEDURES                [facility];  Service: Gastroenterology  04/28/2019: Starr Sinclair CADAVER RENAL GRAFT; Right      Comment:  Procedure: BACKBNCH STD PREP CAD DONR RENAL ALLOGFT                PRIOR TO TRNSPLNT, INCL DISSEC/REM PERINEPH FAT,                DIAPH/RTPER ATTAC;  Surgeon: Doyce Loose, MD;               Location: MAIN OR [place];  Service: Transplant  04/28/2019: PR TRANSPLANTATION OF KIDNEY; Right      Comment:  Procedure: RENAL ALLOTRANSPLANTATION, IMPLANTATION OF                GRAFT; WITHOUT RECIPIENT NEPHRECTOMY;  Surgeon: Doyce Loose, MD;  Location: MAIN OR [facility];  Service:               Transplant   Patient Active Problem List:     Essential hypertension     Gastroesophageal reflux disease without esophagitis     Kidney transplant 04/28/2019     Immunosuppression (CMS-HCC)     Leukopenia     Stage 3a chronic kidney disease (CMS-HCC)     Right lower quadrant pain     Microscopic hematuria     Hordeolum external     Bright red rectal bleeding     Hemorrhoids     Colon polyps     Chronic pain of both shoulders     Trochanteric bursitis of both hips     Persistent adjustment disorder with mixed anxiety and depressed mood     Vaccine counseling     Low vitamin D level     Lesion of nose     Healthcare maintenance     Current Outpatient Medications:  ALPRAZolam (XANAX) 0.5 MG tablet, Take 1 tablet (0.5 mg total) by mouth two (2) times a day as needed for anxiety., Disp: 10 tablet, Rfl: 0  amLODIPine (NORVASC) 5 MG tablet, Take 1 tablet (5 mg total) by mouth every evening., Disp: 30 tablet, Rfl: 11  aspirin (ECOTRIN) 81 MG tablet, Take 1 tablet (81 mg total) by mouth daily., Disp: 30 tablet, Rfl: 11  belatacept (NULOJIX) 250 mg SolR, Belatacept  5 mg/kg every 2 weeks for 5 doses then once every 4 weeks, Disp: 14.5 mL, Rfl: 0  cephalexin (KEFLEX) 500 MG capsule, Take 1 capsule (500 mg total) by mouth four (4) times a day for 10 days., Disp: 40 capsule, Rfl: 0  losartan (COZAAR) 50 MG tablet, Take 1 tablet (50 mg total) by mouth Two (2) times a day., Disp: 60 tablet, Rfl: 11  magnesium oxide-Mg AA chelate (MAGNESIUM, AMINO ACID CHELATE,) 133 mg, Take 1 tablet by mouth daily., Disp: 90 tablet, Rfl: 3  omeprazole (PRILOSEC) 20 MG capsule, Take 1 capsule (20 mg total) by mouth daily., Disp: 30 capsule, Rfl: 11  ondansetron (ZOFRAN-ODT) 4 MG disintegrating tablet, Take 1 tablet (4 mg total) by mouth every eight (8) hours as needed., Disp: , Rfl:   predniSONE (DELTASONE) 5 MG tablet, Take 1 tablet (5 mg total) by mouth daily., Disp: 30 tablet, Rfl: 11    No current facility-administered medications for this visit.  Precautions and Equipment  Precautions: None  Current Braces/Orthoses: None  Equipment Currently Used: None        Treatments  Current treatment: physical therapy          Objective:   Objective    Functional Test/Outcome Measures:  SPADI/QuickDASH: ***    Posture/Observations:   Sitting: rounded shoulders, forward head and increased thoracic kyphosis  Standing: forward flexed posture, rounded shoulders, forward head and increased thoracic kyphosis    Cervical ROM Screen:  ***    Special Tests/Clearing Screens:     Special Test Result   Neer Impingement {SVpositive:48465::Not tested}   Painful Arc {SVpositive:48465::Not tested}   Empty can {SVpositive:48465::Not tested}   Leanord Asal {SVpositive:48465::Not tested}   Pain provocation with resisted ER {SVpositive:48465::Not tested}   Drop arm test {SVpositive:48465::Not tested}   ER Lag sign {SVpositive:48465::Not tested}   Scapular assistance {SVpositive:48465::Not tested}   Scapular Resisted {SVpositive:48465::Not tested}   Speed's {SVpositive:48465::Not tested}   Apprehension-relocation {SVpositive:48465::Not tested}   Bicep load {SVpositive:48465::Not tested}   O'Brien's {SVpositive:48465::Not tested}   Lift Off {SVpositive:48465::Not tested}   Bear hug {SVpositive:48465::Not tested}     Palpation/Segmental Motion/Joint Play:   - TTP: ***  - Mobility: ***    Sensation: ***    Range of Motion/Flexibility:   Motion Active Right Active Left Passive Right Passive Left   Shoulder Flexion ***      Shoulder Abduction       Shoulder IR at ***       Functional IR       Shoulder ER at ***       Functional ER           Strength/MMT:   UE MMT   UE MMT Right Left   Shoulder elevation: {sv_MMT:49440::WFL} {sv_MMT:49440::WFL}   Shoulder IR: {sv_MMT:49440::WFL} {sv_MMT:49440::WFL}   Shoulder ER: {sv_MMT:49440::WFL} {sv_MMT:49440::WFL}   Shoulder abduction {sv_MMT:49440::WFL} {sv_MMT:49440::WFL}       Evaluation: ***  Therex: ***    Total treatment time: ***                              I attest that I have reviewed the above information.  Signed: Vertis Kelch, PT  02/13/2022 7:57 AM

## 2022-02-14 ENCOUNTER — Ambulatory Visit
Admit: 2022-02-14 | Discharge: 2022-02-15 | Payer: PRIVATE HEALTH INSURANCE | Attending: Geriatric Medicine | Primary: Geriatric Medicine

## 2022-02-14 MED ORDER — ALPRAZOLAM 0.5 MG TABLET
ORAL_TABLET | Freq: Two times a day (BID) | ORAL | 0 refills | 5 days | Status: CP | PRN
Start: 2022-02-14 — End: ?

## 2022-02-14 NOTE — Unmapped (Signed)
Patient stable on omeprazole.  At some point would benefit from getting an upper endoscopy for further evaluation.  We will plan for that with next colonoscopy.

## 2022-02-14 NOTE — Unmapped (Signed)
Creatinine is not ideal however has been stable past few years.  Labs are regularly monitored with each infusion of Nulojix.  Would be nice to see patient come off prednisone at some point.  We will need to monitor blood sugars, bone health and discuss stress dosing with infection.

## 2022-02-14 NOTE — Unmapped (Signed)
At goal on current regimen.  Patient to continue monitoring blood pressure at home.

## 2022-02-14 NOTE — Unmapped (Signed)
[facility] Refill Coordination Note    Specialty Medication(s) to be Shipped:   Transplant: Prednisone 5mg     Other medication(s) to be shipped:  amlodipine , aspirin , losartan , omeprazole      Ryan Moon, DOB: Oct 06, 1985  Phone: [phone] (home)       All above HIPAA information was verified with patient.     Was a Nurse, learning disability used for this call? No    Completed refill call assessment today to schedule patient's medication shipment from the [facility] [phone]).  All relevant notes have been reviewed.     Specialty medication(s) and dose(s) confirmed: Regimen is correct and unchanged.   Changes to medications: Ryan Moon reports no changes at this time.  Changes to insurance: No  New side effects reported not previously addressed with a pharmacist or physician: None reported  Questions for the pharmacist: No    Confirmed patient received a Conservation officer, historic buildings and a Surveyor, mining with first shipment. The patient will receive a drug information handout for each medication shipped and additional FDA Medication Guides as required.       DISEASE/MEDICATION-SPECIFIC INFORMATION        N/A    SPECIALTY MEDICATION ADHERENCE     Medication Adherence    Patient reported X missed doses in the last month: 0  Specialty Medication: predniSONE 5 MG tablet (DELTASONE)  Patient is on additional specialty medications: No  Patient is on more than two specialty medications: No                                Were doses missed due to medication being on hold? No    prednisone 8 mg: 8 days of medicine on hand       REFERRAL TO PHARMACIST     Referral to the pharmacist: Not needed      [facility]     Shipping address confirmed in Epic.     Delivery Scheduled: Yes, Expected medication delivery date: 02/20/22.     Medication will be delivered via UPS to the prescription address in Epic WAM.    Ryan Moon   [facility] Specialty Technician

## 2022-02-14 NOTE — Unmapped (Signed)
Due for colonoscopy 3 to 5 years.  Tubular adenoma noted on last colonoscopy.

## 2022-02-14 NOTE — Unmapped (Signed)
Labs checked monthly at each infusion.  Continue healthy diet and exercise.  Consider COVID-vaccine.  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  Likely will be due in 3 to 5 years.  Will recommend upper endoscopy without colonoscopy.  Patient has full set of upper dentures and partial on the bottom.  Recommend he be seen by a dentist at some point.  Its been several years since he has been seen by dentist.  Recommend skin check yearly.  Referral placed for [facility].

## 2022-02-14 NOTE — Unmapped (Signed)
He has tapered down on alprazolam and only takes 2-3 times per week.  Previously he was taking it every day as prevention.  Patient says he did not tolerate SSRI with buspirone.  He said it made him feel very sluggish and down.  prescribing a limited number of alprazolam per month #10 with refills as needed by request.  Discussed possibility of counseling which patient had during his transplantation.  He feels like he would not benefit from more.  Consider trial of a different SSRI in the future if symptoms worsen.  Patient understands the habit-forming nature of benzodiazepines.  Requested that he keep his medications locked up somewhere safe as these are drugs of abuse.

## 2022-02-14 NOTE — Unmapped (Signed)
Patient not interested in coronavirus vaccine.  Will consider meningococcal vaccine.  Will send message to patient's nephrologist to see if he feels it is clinically indicated.

## 2022-02-14 NOTE — Unmapped (Signed)
Last vitamin D levels at goal.  Would likely benefit from calcium with vitamin D supplementation.  Needs to discuss with nephrologist.

## 2022-02-14 NOTE — Unmapped (Signed)
Refer to [facility].  Needs routine skin check as well.

## 2022-02-19 DIAGNOSIS — Z940 Kidney transplant status: Principal | ICD-10-CM

## 2022-02-19 MED FILL — AMLODIPINE 5 MG TABLET: ORAL | 30 days supply | Qty: 30 | Fill #9

## 2022-02-19 MED FILL — OMEPRAZOLE 20 MG CAPSULE,DELAYED RELEASE: ORAL | 30 days supply | Qty: 30 | Fill #6

## 2022-02-19 MED FILL — LOSARTAN 50 MG TABLET: ORAL | 30 days supply | Qty: 60 | Fill #9

## 2022-02-19 MED FILL — ASPIRIN 81 MG TABLET,DELAYED RELEASE: ORAL | 30 days supply | Qty: 30 | Fill #11

## 2022-02-19 MED FILL — PREDNISONE 5 MG TABLET: ORAL | 30 days supply | Qty: 30 | Fill #9

## 2022-03-07 ENCOUNTER — Ambulatory Visit: Admit: 2022-03-07 | Discharge: 2022-03-08 | Payer: PRIVATE HEALTH INSURANCE

## 2022-03-07 LAB — CBC W/ AUTO DIFF
BASOPHILS ABSOLUTE COUNT: 0 10*9/L (ref 0.0–0.1)
BASOPHILS RELATIVE PERCENT: 1.4 %
EOSINOPHILS ABSOLUTE COUNT: 0.1 10*9/L (ref 0.0–0.5)
EOSINOPHILS RELATIVE PERCENT: 3 %
HEMATOCRIT: 37.5 % — ABNORMAL LOW (ref 39.0–48.0)
HEMOGLOBIN: 13.2 g/dL (ref 12.9–16.5)
LYMPHOCYTES ABSOLUTE COUNT: 0.8 10*9/L — ABNORMAL LOW (ref 1.1–3.6)
LYMPHOCYTES RELATIVE PERCENT: 33.4 %
MEAN CORPUSCULAR HEMOGLOBIN CONC: 35.2 g/dL (ref 32.0–36.0)
MEAN CORPUSCULAR HEMOGLOBIN: 32.3 pg (ref 25.9–32.4)
MEAN CORPUSCULAR VOLUME: 91.8 fL (ref 77.6–95.7)
MEAN PLATELET VOLUME: 7.2 fL (ref 6.8–10.7)
MONOCYTES ABSOLUTE COUNT: 0.4 10*9/L (ref 0.3–0.8)
MONOCYTES RELATIVE PERCENT: 16.9 %
NEUTROPHILS ABSOLUTE COUNT: 1.1 10*9/L — ABNORMAL LOW (ref 1.8–7.8)
NEUTROPHILS RELATIVE PERCENT: 45.3 %
PLATELET COUNT: 224 10*9/L (ref 150–450)
RED BLOOD CELL COUNT: 4.09 10*12/L — ABNORMAL LOW (ref 4.26–5.60)
RED CELL DISTRIBUTION WIDTH: 13.6 % (ref 12.2–15.2)
WBC ADJUSTED: 2.4 10*9/L — ABNORMAL LOW (ref 3.6–11.2)

## 2022-03-07 LAB — RENAL FUNCTION PANEL
ALBUMIN: 4.3 g/dL (ref 3.4–5.0)
ANION GAP: 10 mmol/L (ref 5–14)
BLOOD UREA NITROGEN: 18 mg/dL (ref 9–23)
BUN / CREAT RATIO: 10
CALCIUM: 9.2 mg/dL (ref 8.7–10.4)
CHLORIDE: 103 mmol/L (ref 98–107)
CO2: 24 mmol/L (ref 20.0–31.0)
CREATININE: 1.75 mg/dL — ABNORMAL HIGH
EGFR CKD-EPI (2021) MALE: 50 mL/min/{1.73_m2} — ABNORMAL LOW (ref >=60)
GLUCOSE RANDOM: 96 mg/dL (ref 70–179)
PHOSPHORUS: 2.8 mg/dL (ref 2.4–5.1)
POTASSIUM: 3.8 mmol/L (ref 3.4–4.8)
SODIUM: 137 mmol/L (ref 135–145)

## 2022-03-07 LAB — MAGNESIUM: MAGNESIUM: 1.9 mg/dL (ref 1.6–2.6)

## 2022-03-07 MED ADMIN — belatacept (NULOJIX) 437.5 mg in sodium chloride (NS) 0.9 % 100 mL IVPB: 5 mg/kg | INTRAVENOUS | @ 15:00:00 | Stop: 2022-03-07

## 2022-03-07 NOTE — Unmapped (Signed)
1100 Patient arrived to the Transplant Infusion Room today for belatacept 437.5mg , Condition: well; Mobility: ambulating; accompanied by self.   See Flowsheet and MAR for all details of visit.   1100 VS stable.  1111 PIV placed and secured with coban; labs collected and sent, urine collected and sent.  1113 Infusion initiated. Patient educated to notify nursing staff if they experience urticaria, erythema, nausea, shortness of breath, nausea, metal taste in mouth, excessive oral or postnasal drainage and any other changes in status which could be side effects from initiating this medication.  1145 Infusion complete, lijne flushed with NS.   VS stable  1148 Line flushed with NS, PIV removed and secured with coban. Patient left clinic today, Condition: well; Mobility: ambulating; accompanied by self.

## 2022-03-12 MED ORDER — ASPIRIN 81 MG TABLET,DELAYED RELEASE
ORAL_TABLET | Freq: Every day | ORAL | 11 refills | 30 days | Status: CP
Start: 2022-03-12 — End: 2023-03-12
  Filled 2022-03-16: qty 30, 30d supply, fill #0

## 2022-03-12 NOTE — Unmapped (Signed)
[facility] Refill Coordination Note    Specialty Medication(s) to be Shipped:   Transplant: Prednisone 5mg     Other medication(s) to be shipped:  amlodipine 5 mg, aspirin 81 mg, losartan 50 mg, omeprazole 20 mg, magnesium     Ryan Moon, DOB: 1986/05/01  Phone: [phone] (home)       All above HIPAA information was verified with patient.     Was a Nurse, learning disability used for this call? No    Completed refill call assessment today to schedule patient's medication shipment from the [facility] [phone]).  All relevant notes have been reviewed.     Specialty medication(s) and dose(s) confirmed: Regimen is correct and unchanged.   Changes to medications: Ryan Moon reports no changes at this time.  Changes to insurance: No  New side effects reported not previously addressed with a pharmacist or physician: None reported  Questions for the pharmacist: No    Confirmed patient received a Conservation officer, historic buildings and a Surveyor, mining with first shipment. The patient will receive a drug information handout for each medication shipped and additional FDA Medication Guides as required.       DISEASE/MEDICATION-SPECIFIC INFORMATION        N/A    SPECIALTY MEDICATION ADHERENCE     Medication Adherence    Patient reported X missed doses in the last month: 0  Specialty Medication: predniSONE 5 MG tablet (DELTASONE)  Patient is on additional specialty medications: No  Patient is on more than two specialty medications: No                                Were doses missed due to medication being on hold? No    predniSONE 5  mg:  unsure (pt was not home) days of medicine on hand       REFERRAL TO PHARMACIST     Referral to the pharmacist: Not needed      [facility]     Shipping address confirmed in Epic.     Delivery Scheduled: Yes, Expected medication delivery date: 03/15/22.     Medication will be delivered via UPS to the prescription address in Epic WAM.    Ryan Moon   [facility] Specialty Technician

## 2022-03-12 NOTE — Unmapped (Signed)
The patient is requesting a medication refill

## 2022-03-16 MED FILL — MG-PLUS-PROTEIN 133 MG TABLET: ORAL | 90 days supply | Qty: 90 | Fill #1

## 2022-03-16 MED FILL — LOSARTAN 50 MG TABLET: ORAL | 30 days supply | Qty: 60 | Fill #10

## 2022-03-16 MED FILL — PREDNISONE 5 MG TABLET: ORAL | 30 days supply | Qty: 30 | Fill #10

## 2022-03-16 MED FILL — OMEPRAZOLE 20 MG CAPSULE,DELAYED RELEASE: ORAL | 30 days supply | Qty: 30 | Fill #7

## 2022-03-16 MED FILL — AMLODIPINE 5 MG TABLET: ORAL | 30 days supply | Qty: 30 | Fill #10

## 2022-03-22 MED ORDER — ONDANSETRON 4 MG DISINTEGRATING TABLET
ORAL_TABLET | Freq: Three times a day (TID) | ORAL | 0 refills | 10 days | Status: CP | PRN
Start: 2022-03-22 — End: ?

## 2022-03-26 DIAGNOSIS — Z940 Kidney transplant status: Principal | ICD-10-CM

## 2022-04-04 ENCOUNTER — Ambulatory Visit: Admit: 2022-04-04 | Discharge: 2022-04-05 | Payer: PRIVATE HEALTH INSURANCE

## 2022-04-04 LAB — CBC W/ AUTO DIFF
BASOPHILS ABSOLUTE COUNT: 0 10*9/L (ref 0.0–0.1)
BASOPHILS RELATIVE PERCENT: 1.3 %
EOSINOPHILS ABSOLUTE COUNT: 0.1 10*9/L (ref 0.0–0.5)
EOSINOPHILS RELATIVE PERCENT: 4.6 %
HEMATOCRIT: 34.1 % — ABNORMAL LOW (ref 39.0–48.0)
HEMOGLOBIN: 12.3 g/dL — ABNORMAL LOW (ref 12.9–16.5)
LYMPHOCYTES ABSOLUTE COUNT: 0.9 10*9/L — ABNORMAL LOW (ref 1.1–3.6)
LYMPHOCYTES RELATIVE PERCENT: 41.2 %
MEAN CORPUSCULAR HEMOGLOBIN CONC: 36.1 g/dL — ABNORMAL HIGH (ref 32.0–36.0)
MEAN CORPUSCULAR HEMOGLOBIN: 33.1 pg — ABNORMAL HIGH (ref 25.9–32.4)
MEAN CORPUSCULAR VOLUME: 91.6 fL (ref 77.6–95.7)
MEAN PLATELET VOLUME: 7 fL (ref 6.8–10.7)
MONOCYTES ABSOLUTE COUNT: 0.4 10*9/L (ref 0.3–0.8)
MONOCYTES RELATIVE PERCENT: 17.8 %
NEUTROPHILS ABSOLUTE COUNT: 0.8 10*9/L — ABNORMAL LOW (ref 1.8–7.8)
NEUTROPHILS RELATIVE PERCENT: 35.1 %
PLATELET COUNT: 220 10*9/L (ref 150–450)
RED BLOOD CELL COUNT: 3.73 10*12/L — ABNORMAL LOW (ref 4.26–5.60)
RED CELL DISTRIBUTION WIDTH: 14.3 % (ref 12.2–15.2)
WBC ADJUSTED: 2.2 10*9/L — ABNORMAL LOW (ref 3.6–11.2)

## 2022-04-04 LAB — SLIDE REVIEW

## 2022-04-04 LAB — MAGNESIUM: MAGNESIUM: 2 mg/dL (ref 1.6–2.6)

## 2022-04-04 LAB — RENAL FUNCTION PANEL
ALBUMIN: 4.4 g/dL (ref 3.4–5.0)
ANION GAP: 10 mmol/L (ref 5–14)
BLOOD UREA NITROGEN: 18 mg/dL (ref 9–23)
BUN / CREAT RATIO: 10
CALCIUM: 9.4 mg/dL (ref 8.7–10.4)
CHLORIDE: 104 mmol/L (ref 98–107)
CO2: 24 mmol/L (ref 20.0–31.0)
CREATININE: 1.72 mg/dL — ABNORMAL HIGH
EGFR CKD-EPI (2021) MALE: 52 mL/min/{1.73_m2} — ABNORMAL LOW (ref >=60)
GLUCOSE RANDOM: 89 mg/dL (ref 70–99)
PHOSPHORUS: 3.6 mg/dL (ref 2.4–5.1)
POTASSIUM: 4.1 mmol/L (ref 3.4–4.8)
SODIUM: 138 mmol/L (ref 135–145)

## 2022-04-04 MED ADMIN — belatacept (NULOJIX) 450 mg in sodium chloride (NS) 0.9 % 100 mL IVPB: 5 mg/kg | INTRAVENOUS | @ 14:00:00 | Stop: 2022-04-04

## 2022-04-04 NOTE — Unmapped (Signed)
1610 Patient arrived to the Transplant Infusion Room today for belatacept 450 mg, Condition: well; Mobility: ambulating; accompanied by self.   See Flowsheet and MAR for all details of visit.   0930 VS stable.  0945 PIV placed and secured with coban; labs collected and sent, urine collected and sent.  9604 Infusion initiated. Patient educated to notify nursing staff if they experience urticaria, erythema, nausea, shortness of breath, nausea, metal taste in mouth, excessive oral or postnasal drainage and any other changes in status which could be side effects from initiating this medication.  1019 Infusion complete.   1030 VS stable, Line flushed with NS, PIV removed and secured with coban. Patient left clinic today, Condition: well; Mobility: ambulating; accompanied by self.

## 2022-04-09 NOTE — Unmapped (Signed)
[facility] Clinical Assessment & Refill Coordination Note    Ryan Moon, DOB: 03/03/1986  Phone: [phone] (home)     All above HIPAA information was verified with patient.     Was a Nurse, learning disability used for this call? No    Specialty Medication(s):   Transplant: Prednisone 5mg      Current Outpatient Medications   Medication Sig Dispense Refill    ALPRAZolam (XANAX) 0.5 MG tablet Take 1 tablet (0.5 mg total) by mouth two (2) times a day as needed for anxiety. 10 tablet 0    amLODIPine (NORVASC) 5 MG tablet Take 1 tablet (5 mg total) by mouth every evening. 30 tablet 11    aspirin (ECOTRIN) 81 MG tablet Take 1 tablet (81 mg total) by mouth daily. 30 tablet 11    belatacept (NULOJIX) 250 mg SolR Belatacept  5 mg/kg every 2 weeks for 5 doses then once every 4 weeks 14.5 mL 0    losartan (COZAAR) 50 MG tablet Take 1 tablet (50 mg total) by mouth Two (2) times a day. 60 tablet 11    magnesium oxide-Mg AA chelate (MAGNESIUM, AMINO ACID CHELATE,) 133 mg Take 1 tablet by mouth daily. 90 tablet 3    omeprazole (PRILOSEC) 20 MG capsule Take 1 capsule (20 mg total) by mouth daily. 30 capsule 11    ondansetron (ZOFRAN-ODT) 4 MG disintegrating tablet Take 1 tablet (4 mg total) by mouth every eight (8) hours as needed. 30 tablet 0    predniSONE (DELTASONE) 5 MG tablet Take 1 tablet (5 mg total) by mouth daily. 30 tablet 11     No current facility-administered medications for this visit.        Changes to medications: Dilyn reports no changes at this time.    Allergies   Allergen Reactions    Ibuprofen     Acetaminophen Nausea And Vomiting       Changes to allergies: No    SPECIALTY MEDICATION ADHERENCE     Prednisone 5 mg: 7 days of medicine on hand     Medication Adherence    Patient reported X missed doses in the last month: 0  Specialty Medication: Prednisone 5 mg QD  Patient is on additional specialty medications: No  Informant: patient                            Specialty medication(s) dose(s) confirmed: Regimen is correct and unchanged.     Are there any concerns with adherence? No    Adherence counseling provided? Not needed    CLINICAL MANAGEMENT AND INTERVENTION      Clinical Benefit Assessment:    Do you feel the medicine is effective or helping your condition? Yes    Clinical Benefit counseling provided? Not needed    Adverse Effects Assessment:    Are you experiencing any side effects? No    Are you experiencing difficulty administering your medicine? No    Quality of Life Assessment:    Quality of Life    Rheumatology  Oncology  Dermatology  Cystic Fibrosis          How many days over the past month did your Kidney Transplant  keep you from your normal activities? For example, brushing your teeth or getting up in the morning. 0    Have you discussed this with your provider? Not needed    Acute Infection Status:    Acute infections noted  within Epic:  No active infections  Patient reported infection: None    Therapy Appropriateness:    Is therapy appropriate and patient progressing towards therapeutic goals? Yes, therapy is appropriate and should be continued    DISEASE/MEDICATION-SPECIFIC INFORMATION      N/A    Solid Organ Transplant: Not Applicable    PATIENT SPECIFIC NEEDS     Does the patient have any physical, cognitive, or cultural barriers? No    Is the patient high risk? No    Did the patient require a clinical intervention? No    Does the patient require physician intervention or other additional services (i.e., nutrition, smoking cessation, social work)? No    SOCIAL DETERMINANTS OF HEALTH     At the [facility], we have learned that life circumstances - like trouble affording food, housing, utilities, or transportation can affect the health of many of our patients.   That is why we wanted to ask: are you currently experiencing any life circumstances that are negatively impacting your health and/or quality of life? Patient declined to answer    Social Determinants of Health Financial Resource Strain: Low Risk  (11/01/2021)    Overall Financial Resource Strain (CARDIA)     Difficulty of Paying Living Expenses: Not hard at all   Internet Connectivity: Not on file   Food Insecurity: No Food Insecurity (11/01/2021)    Hunger Vital Sign     Worried About Running Out of Food in the Last Year: Never true     Ran Out of Food in the Last Year: Never true   Tobacco Use: Medium Risk (02/14/2022)    Patient History     Smoking Tobacco Use: Former     Smokeless Tobacco Use: Former     Passive Exposure: Not on file   Housing/Utilities: Low Risk  (11/01/2021)    Housing/Utilities     Within the past 12 months, have you ever stayed: outside, in a car, in a tent, in an overnight shelter, or temporarily in someone else's home (i.e. couch-surfing)?: No     Are you worried about losing your housing?: No     Within the past 12 months, have you been unable to get utilities (heat, electricity) when it was really needed?: No   Alcohol Use: Not At Risk (11/01/2021)    Alcohol Use     How often do you have a drink containing alcohol?: Monthly or less     How many drinks containing alcohol do you have on a typical day when you are drinking?: 1 - 2     How often do you have 5 or more drinks on one occasion?: Never   Transportation Needs: No Transportation Needs (11/01/2021)    PRAPARE - Therapist, art (Medical): No     Lack of Transportation (Non-Medical): No   Substance Use: Not on file   Health Literacy: Low Risk  (11/01/2021)    Health Literacy     : Never   Physical Activity: Not on file   Interpersonal Safety: Not on file   Stress: Not on file   Intimate Partner Violence: Not on file   Depression: Not on file   Social Connections: Not on file       Would you be willing to receive help with any of the needs that you have identified today? Not applicable       SHIPPING     Specialty Medication(s) to be Shipped:  Transplant: Prednisone 5mg     Other medication(s) to be shipped: Amlodipine 5 mg, aspirin 81 mg, losartan 50 mg, omeprazole 20 mg      Changes to insurance: No    Delivery Scheduled: Yes, Expected medication delivery date: 04/13/22.     Medication will be delivered via UPS to the confirmed prescription address in [facility].    The patient will receive a drug information handout for each medication shipped and additional FDA Medication Guides as required.  Verified that patient has previously received a Conservation officer, historic buildings and a Surveyor, mining.    The patient or caregiver noted above participated in the development of this care plan and knows that they can request review of or adjustments to the care plan at any time.      All of the patient's questions and concerns have been addressed.    Sherral Hammers, PharmD   [facility] Specialty Pharmacist

## 2022-04-16 MED FILL — LOSARTAN 50 MG TABLET: ORAL | 30 days supply | Qty: 60 | Fill #11

## 2022-04-16 MED FILL — OMEPRAZOLE 20 MG CAPSULE,DELAYED RELEASE: ORAL | 30 days supply | Qty: 30 | Fill #8

## 2022-04-16 MED FILL — PREDNISONE 5 MG TABLET: ORAL | 30 days supply | Qty: 30 | Fill #11

## 2022-04-16 MED FILL — AMLODIPINE 5 MG TABLET: ORAL | 30 days supply | Qty: 30 | Fill #11

## 2022-04-16 MED FILL — ASPIRIN 81 MG TABLET,DELAYED RELEASE: ORAL | 30 days supply | Qty: 30 | Fill #1

## 2022-05-03 MED ORDER — ALPRAZOLAM 0.5 MG TABLET
ORAL_TABLET | Freq: Two times a day (BID) | ORAL | 0 refills | 5 days | Status: CP | PRN
Start: 2022-05-03 — End: ?

## 2022-05-03 NOTE — Unmapped (Signed)
Addended by: Willia Craze on: 05/03/2022 12:50 PM     Modules accepted: Orders

## 2022-05-07 MED ORDER — LOSARTAN 50 MG TABLET
ORAL_TABLET | Freq: Two times a day (BID) | ORAL | 11 refills | 30 days | Status: CP
Start: 2022-05-07 — End: 2023-05-07
  Filled 2022-05-14: qty 60, 30d supply, fill #0

## 2022-05-07 MED ORDER — AMLODIPINE 5 MG TABLET
ORAL_TABLET | Freq: Every evening | ORAL | 11 refills | 30 days | Status: CP
Start: 2022-05-07 — End: 2023-05-07
  Filled 2022-05-14: qty 30, 30d supply, fill #0

## 2022-05-07 MED ORDER — PREDNISONE 5 MG TABLET
ORAL_TABLET | Freq: Every day | ORAL | 11 refills | 30 days | Status: CP
Start: 2022-05-07 — End: ?
  Filled 2022-05-14: qty 30, 30d supply, fill #0

## 2022-05-07 NOTE — Unmapped (Signed)
Pt request for RX Refill

## 2022-05-07 NOTE — Unmapped (Signed)
[facility] Refill Coordination Note    Specialty Medication(s) to be Shipped:   Transplant: Prednisone 5mg     Other medication(s) to be shipped:  amlodipine , aspirin , losartan , omeprazole      Ryan Moon, DOB: 06/30/1985  Phone: [phone] (home)       All above HIPAA information was verified with patient.     Was a Nurse, learning disability used for this call? No    Completed refill call assessment today to schedule patient's medication shipment from the [facility] [phone]).  All relevant notes have been reviewed.     Specialty medication(s) and dose(s) confirmed: Regimen is correct and unchanged.   Changes to medications: Ryan Moon reports no changes at this time.  Changes to insurance: No  New side effects reported not previously addressed with a pharmacist or physician: None reported  Questions for the pharmacist: No    Confirmed patient received a Conservation officer, historic buildings and a Surveyor, mining with first shipment. The patient will receive a drug information handout for each medication shipped and additional FDA Medication Guides as required.       DISEASE/MEDICATION-SPECIFIC INFORMATION        N/A    SPECIALTY MEDICATION ADHERENCE     Medication Adherence    Patient reported X missed doses in the last month: 0  Specialty Medication: predniSONE 5 MG tablet (DELTASONE)  Patient is on additional specialty medications: No                                Were doses missed due to medication being on hold? No    prednisone 5 mg: 8 days of medicine on hand       REFERRAL TO PHARMACIST     Referral to the pharmacist: Not needed      [facility]     Shipping address confirmed in Epic.     Delivery Scheduled: Yes, Expected medication delivery date: 05/11/22.     Medication will be delivered via UPS to the prescription address in Epic WAM.    Quintella Reichert   [facility] Specialty Technician

## 2022-05-09 ENCOUNTER — Ambulatory Visit: Admit: 2022-05-09 | Discharge: 2022-05-10 | Payer: PRIVATE HEALTH INSURANCE

## 2022-05-09 LAB — SLIDE REVIEW

## 2022-05-09 LAB — URINALYSIS WITH MICROSCOPY WITH CULTURE REFLEX
BILIRUBIN UA: NEGATIVE
GLUCOSE UA: NEGATIVE
KETONES UA: NEGATIVE
LEUKOCYTE ESTERASE UA: NEGATIVE
NITRITE UA: NEGATIVE
PH UA: 6.5 (ref 5.0–9.0)
PROTEIN UA: 30 — AB
RBC UA: 17 /HPF — ABNORMAL HIGH (ref <=3)
SPECIFIC GRAVITY UA: 1.006 (ref 1.003–1.030)
SQUAMOUS EPITHELIAL: <1 /HPF (ref 0–5)
UROBILINOGEN UA: <2
WBC UA: 3 /HPF — ABNORMAL HIGH (ref <=2)

## 2022-05-09 LAB — CBC W/ AUTO DIFF
BASOPHILS ABSOLUTE COUNT: 0 10*9/L (ref 0.0–0.1)
BASOPHILS RELATIVE PERCENT: 1.3 %
EOSINOPHILS ABSOLUTE COUNT: 0.1 10*9/L (ref 0.0–0.5)
EOSINOPHILS RELATIVE PERCENT: 2.8 %
HEMATOCRIT: 34.2 % — ABNORMAL LOW (ref 39.0–48.0)
HEMOGLOBIN: 12.1 g/dL — ABNORMAL LOW (ref 12.9–16.5)
LYMPHOCYTES ABSOLUTE COUNT: 0.6 10*9/L — ABNORMAL LOW (ref 1.1–3.6)
LYMPHOCYTES RELATIVE PERCENT: 32.4 %
MEAN CORPUSCULAR HEMOGLOBIN CONC: 35.5 g/dL (ref 32.0–36.0)
MEAN CORPUSCULAR HEMOGLOBIN: 32.7 pg — ABNORMAL HIGH (ref 25.9–32.4)
MEAN CORPUSCULAR VOLUME: 92 fL (ref 77.6–95.7)
MEAN PLATELET VOLUME: 7.6 fL (ref 6.8–10.7)
MONOCYTES ABSOLUTE COUNT: 0.3 10*9/L (ref 0.3–0.8)
MONOCYTES RELATIVE PERCENT: 15.5 %
NEUTROPHILS ABSOLUTE COUNT: 0.9 10*9/L — ABNORMAL LOW (ref 1.8–7.8)
NEUTROPHILS RELATIVE PERCENT: 48 %
PLATELET COUNT: 234 10*9/L (ref 150–450)
RED BLOOD CELL COUNT: 3.71 10*12/L — ABNORMAL LOW (ref 4.26–5.60)
RED CELL DISTRIBUTION WIDTH: 14.1 % (ref 12.2–15.2)
WBC ADJUSTED: 1.9 10*9/L — ABNORMAL LOW (ref 3.6–11.2)

## 2022-05-09 LAB — BASIC METABOLIC PANEL
ANION GAP: 6 mmol/L (ref 5–14)
BLOOD UREA NITROGEN: 23 mg/dL (ref 9–23)
BUN / CREAT RATIO: 12
CALCIUM: 9.4 mg/dL (ref 8.7–10.4)
CHLORIDE: 105 mmol/L (ref 98–107)
CO2: 27 mmol/L (ref 20.0–31.0)
CREATININE: 1.86 mg/dL — ABNORMAL HIGH
EGFR CKD-EPI (2021) MALE: 47 mL/min/{1.73_m2} — ABNORMAL LOW (ref >=60)
GLUCOSE RANDOM: 108 mg/dL (ref 70–179)
SODIUM: 138 mmol/L (ref 135–145)

## 2022-05-09 LAB — PHOSPHORUS: PHOSPHORUS: 2.2 mg/dL — ABNORMAL LOW (ref 2.4–5.1)

## 2022-05-09 LAB — RENAL FUNCTION PANEL
ALBUMIN: 4.3 g/dL (ref 3.4–5.0)
ANION GAP: 7 mmol/L (ref 5–14)
BLOOD UREA NITROGEN: 24 mg/dL — ABNORMAL HIGH (ref 9–23)
BUN / CREAT RATIO: 13
CALCIUM: 9.4 mg/dL (ref 8.7–10.4)
CHLORIDE: 104 mmol/L (ref 98–107)
CO2: 27 mmol/L (ref 20.0–31.0)
CREATININE: 1.88 mg/dL — ABNORMAL HIGH
EGFR CKD-EPI (2021) MALE: 46 mL/min/{1.73_m2} — ABNORMAL LOW (ref >=60)
GLUCOSE RANDOM: 107 mg/dL — ABNORMAL HIGH (ref 70–99)
PHOSPHORUS: 2.2 mg/dL — ABNORMAL LOW (ref 2.4–5.1)
POTASSIUM: 4.1 mmol/L (ref 3.4–4.8)
SODIUM: 138 mmol/L (ref 135–145)

## 2022-05-09 LAB — MAGNESIUM: MAGNESIUM: 2.1 mg/dL (ref 1.6–2.6)

## 2022-05-09 MED ADMIN — belatacept (NULOJIX) 450 mg in sodium chloride (NS) 0.9 % 100 mL IVPB: 5 mg/kg | INTRAVENOUS | @ 15:00:00 | Stop: 2022-05-09

## 2022-05-09 NOTE — Unmapped (Signed)
1610 Patient arrived to the Transplant Infusion Room today for belatacept 450 mg, Condition: well; Mobility: ambulating; accompanied by self.   See Flowsheet and MAR for all details of visit.   0915 VS stable.  0932 PIV placed and secured with coban; labs collected and sent, urine collected and sent.  0935 Infusion initiated. Patient educated to notify nursing staff if they experience urticaria, erythema, nausea, shortness of breath, nausea, metal taste in mouth, excessive oral or postnasal drainage and any other changes in status which could be side effects from initiating this medication.  1006 Infusion complete.   1015 VS stable, Line flushed with NS, PIV removed and secured with coban. Patient left clinic today, Condition: well; Mobility: ambulating; accompanied by self.

## 2022-05-14 MED FILL — OMEPRAZOLE 20 MG CAPSULE,DELAYED RELEASE: ORAL | 30 days supply | Qty: 30 | Fill #9

## 2022-05-14 MED FILL — ASPIRIN 81 MG TABLET,DELAYED RELEASE: ORAL | 30 days supply | Qty: 30 | Fill #2

## 2022-05-26 DIAGNOSIS — Z940 Kidney transplant status: Principal | ICD-10-CM

## 2022-05-30 ENCOUNTER — Ambulatory Visit: Admit: 2022-05-30 | Discharge: 2022-05-31 | Payer: PRIVATE HEALTH INSURANCE

## 2022-05-30 ENCOUNTER — Ambulatory Visit
Admit: 2022-05-30 | Discharge: 2022-05-31 | Payer: PRIVATE HEALTH INSURANCE | Attending: Nephrology | Primary: Nephrology

## 2022-05-30 DIAGNOSIS — M791 Myalgia, unspecified site: Principal | ICD-10-CM

## 2022-05-30 DIAGNOSIS — Z940 Kidney transplant status: Principal | ICD-10-CM

## 2022-05-30 DIAGNOSIS — Z79899 Other long term (current) drug therapy: Principal | ICD-10-CM

## 2022-05-30 DIAGNOSIS — I7782 Antineutrophilic cytoplasmic antibody (ANCA) vasculitis (CMS-HCC): Principal | ICD-10-CM

## 2022-05-30 LAB — COMPREHENSIVE METABOLIC PANEL
ALBUMIN: 3.8 g/dL (ref 3.4–5.0)
ALKALINE PHOSPHATASE: 66 U/L (ref 46–116)
ALT (SGPT): 12 U/L (ref 10–49)
ANION GAP: 7 mmol/L (ref 5–14)
AST (SGOT): 20 U/L (ref <=34)
BILIRUBIN TOTAL: 0.2 mg/dL — ABNORMAL LOW (ref 0.3–1.2)
BLOOD UREA NITROGEN: 30 mg/dL — ABNORMAL HIGH (ref 9–23)
BUN / CREAT RATIO: 14
CALCIUM: 9.8 mg/dL (ref 8.7–10.4)
CHLORIDE: 103 mmol/L (ref 98–107)
CO2: 27.6 mmol/L (ref 20.0–31.0)
CREATININE: 2.16 mg/dL — ABNORMAL HIGH
EGFR CKD-EPI (2021) MALE: 39 mL/min/{1.73_m2} — ABNORMAL LOW (ref >=60)
GLUCOSE RANDOM: 89 mg/dL (ref 70–99)
POTASSIUM: 4 mmol/L (ref 3.4–4.8)
PROTEIN TOTAL: 7.4 g/dL (ref 5.7–8.2)
SODIUM: 138 mmol/L (ref 135–145)

## 2022-05-30 LAB — URINALYSIS WITH MICROSCOPY
BILIRUBIN UA: NEGATIVE
GLUCOSE UA: NEGATIVE
KETONES UA: NEGATIVE
LEUKOCYTE ESTERASE UA: NEGATIVE
NITRITE UA: NEGATIVE
PH UA: 6 (ref 5.0–9.0)
PROTEIN UA: 100 — AB
RBC UA: 100 /HPF — ABNORMAL HIGH (ref <3)
SPECIFIC GRAVITY UA: 1.01 (ref 1.005–1.030)
SQUAMOUS EPITHELIAL: 1 /HPF (ref 0–5)
UROBILINOGEN UA: 0.2
WBC UA: 15 /HPF — ABNORMAL HIGH (ref <2)

## 2022-05-30 LAB — CBC W/ AUTO DIFF
BASOPHILS ABSOLUTE COUNT: 0 10*9/L (ref 0.0–0.1)
BASOPHILS RELATIVE PERCENT: 0.2 %
EOSINOPHILS ABSOLUTE COUNT: 0.1 10*9/L (ref 0.0–0.5)
EOSINOPHILS RELATIVE PERCENT: 4.3 %
HEMATOCRIT: 33.6 % — ABNORMAL LOW (ref 39.0–48.0)
HEMOGLOBIN: 11.9 g/dL — ABNORMAL LOW (ref 12.9–16.5)
LYMPHOCYTES ABSOLUTE COUNT: 0.9 10*9/L — ABNORMAL LOW (ref 1.1–3.6)
LYMPHOCYTES RELATIVE PERCENT: 43.6 %
MEAN CORPUSCULAR HEMOGLOBIN CONC: 35.4 g/dL (ref 32.0–36.0)
MEAN CORPUSCULAR HEMOGLOBIN: 32.7 pg — ABNORMAL HIGH (ref 25.9–32.4)
MEAN CORPUSCULAR VOLUME: 92.4 fL (ref 77.6–95.7)
MEAN PLATELET VOLUME: 7.6 fL (ref 6.8–10.7)
MONOCYTES ABSOLUTE COUNT: 0.5 10*9/L (ref 0.3–0.8)
MONOCYTES RELATIVE PERCENT: 23.1 %
NEUTROPHILS ABSOLUTE COUNT: 0.6 10*9/L — ABNORMAL LOW (ref 1.8–7.8)
NEUTROPHILS RELATIVE PERCENT: 28.8 %
NUCLEATED RED BLOOD CELLS: 0 /100{WBCs} (ref <=4)
PLATELET COUNT: 196 10*9/L (ref 150–450)
RED BLOOD CELL COUNT: 3.63 10*12/L — ABNORMAL LOW (ref 4.26–5.60)
RED CELL DISTRIBUTION WIDTH: 13.5 % (ref 12.2–15.2)
WBC ADJUSTED: 2 10*9/L — ABNORMAL LOW (ref 3.6–11.2)

## 2022-05-30 LAB — LIPID PANEL
CHOLESTEROL/HDL RATIO SCREEN: 3.4 (ref 1.0–4.5)
CHOLESTEROL: 229 mg/dL — ABNORMAL HIGH (ref <=200)
HDL CHOLESTEROL: 68 mg/dL — ABNORMAL HIGH (ref 40–60)
LDL CHOLESTEROL CALCULATED: 142 mg/dL — ABNORMAL HIGH (ref 40–99)
NON-HDL CHOLESTEROL: 161 mg/dL — ABNORMAL HIGH (ref 70–130)
TRIGLYCERIDES: 95 mg/dL (ref 0–150)
VLDL CHOLESTEROL CAL: 19 mg/dL (ref 10–50)

## 2022-05-30 LAB — HEMOGLOBIN A1C
ESTIMATED AVERAGE GLUCOSE: 105 mg/dL
HEMOGLOBIN A1C: 5.3 % (ref 4.8–5.6)

## 2022-05-30 LAB — PROTEIN / CREATININE RATIO, URINE
CREATININE, URINE: 49.5 mg/dL
PROTEIN URINE: 58.3 mg/dL
PROTEIN/CREAT RATIO, URINE: 1.178

## 2022-05-30 LAB — SLIDE REVIEW

## 2022-05-30 LAB — BILIRUBIN, DIRECT: BILIRUBIN DIRECT: <0.1 mg/dL (ref 0.00–0.30)

## 2022-05-30 LAB — CMV DNA, QUANTITATIVE, PCR: CMV VIRAL LD: NOT DETECTED

## 2022-05-30 LAB — CK: CREATINE KINASE TOTAL: 84 U/L

## 2022-05-30 LAB — BK VIRUS QUANTITATIVE PCR, BLOOD: BK BLOOD RESULT: NOT DETECTED

## 2022-05-30 LAB — PHOSPHORUS: PHOSPHORUS: 3.2 mg/dL (ref 2.4–5.1)

## 2022-05-30 LAB — MAGNESIUM: MAGNESIUM: 2.1 mg/dL (ref 1.6–2.6)

## 2022-05-30 NOTE — Unmapped (Signed)
Transplant Coordinator, Clinic Visit   Pt seen today by transplant nephrology for follow up, reviewed medications and symptoms.          05/30/22 0834   BP: 133/95   Pulse: 77   Temp: 36 ??C (96.8 ??F)   Weight: 91.2 kg (201 lb)   Height: 179.5 cm (5' 10.67)   PainSc: 0-No pain       Assessment  BP @ Home: 120/80s  BG: N/A  HA/Dizziness/Lightheaded: No  Hand tremors: No  Numbness/tingling: No  Fevers/Chills/sweats: No  Chest Pain/SOB: No  N/V/Heartburn: No - prilosec  Diarrhea/constipation: No  UTI symptoms: Pink-tinged/brown urine x2 days post cold this week. No food/fluids x1 day of fatigue.  Swelling: No  Pain: No  Incision/Drain/Foley: N/A    Good appetite; reports adequate hydration: 7-8 water bottles per day    Any new medications? No  Immunosuppressant last taken: Kandis Nab - 05/13/22    Immunization status: Refused covid and flu      Functional Score: 100   Normal no complaints; no evidence of  disease.     Pt had a cold over Christmas, did not swab for anything. +body aches, fatigue, no fever. Feeling better today, noticed more pink/brown urine x2 days. Per Dr. 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 Possible vasculitis flare up, ANCA lab order per Dr. Toni Arthurs. Will repeat labs next week after hydrating. Possible biopsy needed pending results in 1-2 weeks.

## 2022-05-31 NOTE — Unmapped (Signed)
Called, sent text and MyChart message for patient to call to schedule April follow-up with Dr. Toni Arthurs.

## 2022-06-01 LAB — FSAB CLASS 2 ANTIBODY SPECIFICITY: HLA CL2 AB RESULT: NEGATIVE

## 2022-06-01 LAB — HLA DS POST TRANSPLANT
ANTI-DONOR DRW #1 MFI: 8 MFI
ANTI-DONOR DRW #2 MFI: 0 MFI
ANTI-DONOR HLA-A #1 MFI: 29 MFI
ANTI-DONOR HLA-A #2 MFI: 35 MFI
ANTI-DONOR HLA-B #1 MFI: 0 MFI
ANTI-DONOR HLA-B #2 MFI: 0 MFI
ANTI-DONOR HLA-C #2 MFI: 0 MFI
ANTI-DONOR HLA-DP #2 MFI: 25 MFI
ANTI-DONOR HLA-DQB #1 MFI: 0 MFI
ANTI-DONOR HLA-DQB #2 MFI: 21 MFI
ANTI-DONOR HLA-DR #1 MFI: 29 MFI
ANTI-DONOR HLA-DR #2 MFI: 28 MFI

## 2022-06-01 LAB — FSAB CLASS 1 ANTIBODY SPECIFICITY: HLA CLASS 1 ANTIBODY RESULT: NEGATIVE

## 2022-06-03 ENCOUNTER — Ambulatory Visit
Admit: 2022-06-03 | Discharge: 2022-06-05 | Disposition: A | Discharge status: Home / Self Care | Payer: PRIVATE HEALTH INSURANCE | Admitting: Nephrology

## 2022-06-03 LAB — CBC W/ AUTO DIFF
BASOPHILS ABSOLUTE COUNT: 0 10*9/L (ref 0.0–0.1)
BASOPHILS RELATIVE PERCENT: 0.6 %
EOSINOPHILS ABSOLUTE COUNT: 0.1 10*9/L (ref 0.0–0.5)
EOSINOPHILS RELATIVE PERCENT: 5.1 %
HEMATOCRIT: 35.2 % — ABNORMAL LOW (ref 39.0–48.0)
HEMOGLOBIN: 12.3 g/dL — ABNORMAL LOW (ref 12.9–16.5)
LYMPHOCYTES ABSOLUTE COUNT: 0.9 10*9/L — ABNORMAL LOW (ref 1.1–3.6)
LYMPHOCYTES RELATIVE PERCENT: 39.8 %
MEAN CORPUSCULAR HEMOGLOBIN CONC: 34.9 g/dL (ref 32.0–36.0)
MEAN CORPUSCULAR HEMOGLOBIN: 31.9 pg (ref 25.9–32.4)
MEAN CORPUSCULAR VOLUME: 91.5 fL (ref 77.6–95.7)
MEAN PLATELET VOLUME: 7 fL (ref 6.8–10.7)
MONOCYTES ABSOLUTE COUNT: 0.5 10*9/L (ref 0.3–0.8)
MONOCYTES RELATIVE PERCENT: 20.2 %
NEUTROPHILS ABSOLUTE COUNT: 0.8 10*9/L — ABNORMAL LOW (ref 1.8–7.8)
NEUTROPHILS RELATIVE PERCENT: 34.3 %
PLATELET COUNT: 207 10*9/L (ref 150–450)
RED BLOOD CELL COUNT: 3.85 10*12/L — ABNORMAL LOW (ref 4.26–5.60)
RED CELL DISTRIBUTION WIDTH: 13.4 % (ref 12.2–15.2)
WBC ADJUSTED: 2.3 10*9/L — ABNORMAL LOW (ref 3.6–11.2)

## 2022-06-03 LAB — COMPREHENSIVE METABOLIC PANEL
ALBUMIN: 4.2 g/dL (ref 3.4–5.0)
ALKALINE PHOSPHATASE: 95 U/L (ref 46–116)
ALT (SGPT): 36 U/L (ref 10–49)
ANION GAP: 6 mmol/L (ref 5–14)
AST (SGOT): 41 U/L — ABNORMAL HIGH (ref <=34)
BILIRUBIN TOTAL: 0.3 mg/dL (ref 0.3–1.2)
BLOOD UREA NITROGEN: 20 mg/dL (ref 9–23)
BUN / CREAT RATIO: 9
CALCIUM: 9.1 mg/dL (ref 8.7–10.4)
CHLORIDE: 100 mmol/L (ref 98–107)
CO2: 27 mmol/L (ref 20.0–31.0)
CREATININE: 2.11 mg/dL — ABNORMAL HIGH
EGFR CKD-EPI (2021) MALE: 40 mL/min/{1.73_m2} — ABNORMAL LOW (ref >=60)
GLUCOSE RANDOM: 97 mg/dL (ref 70–179)
POTASSIUM: 3.4 mmol/L (ref 3.4–4.8)
PROTEIN TOTAL: 8.1 g/dL (ref 5.7–8.2)
SODIUM: 133 mmol/L — ABNORMAL LOW (ref 135–145)

## 2022-06-03 LAB — FIBRINOGEN: FIBRINOGEN LEVEL: 831 mg/dL — ABNORMAL HIGH (ref 175–500)

## 2022-06-03 LAB — APTT
APTT: 29.6 s (ref 24.8–38.4)
HEPARIN CORRELATION: 0.2

## 2022-06-03 LAB — D-DIMER, QUANTITATIVE: D-DIMER QUANTITATIVE (CW,ML,HL,HS,CH,JS,JC,RX,RH): <215 ng{FEU}/mL (ref <=500)

## 2022-06-03 LAB — SLIDE REVIEW

## 2022-06-03 LAB — PROTIME-INR
INR: 0.98
PROTIME: 11 s (ref 9.9–12.6)

## 2022-06-03 LAB — LACTATE SEPSIS, VENOUS: LACTATE BLOOD VENOUS: 0.8 mmol/L (ref 0.5–1.8)

## 2022-06-03 MED ADMIN — remdesivir (VEKLURY) 200 mg in sodium chloride (NS) 0.9 % 315 mL IVPB: 200 mg | INTRAVENOUS | @ 21:00:00 | Stop: 2022-06-03

## 2022-06-03 MED ADMIN — HYDROmorphone (PF) (DILAUDID) injection 1 mg: 1 mg | INTRAVENOUS | @ 20:00:00 | Stop: 2022-06-03

## 2022-06-03 MED ADMIN — HYDROmorphone (PF) (DILAUDID) injection 1 mg: 1 mg | INTRAVENOUS | @ 10:00:00 | Stop: 2022-06-03

## 2022-06-03 MED ADMIN — predniSONE (DELTASONE) tablet 5 mg: 5 mg | ORAL | @ 15:00:00

## 2022-06-03 MED ADMIN — lactated ringers bolus 2,736 mL: 30 mL/kg | INTRAVENOUS | @ 10:00:00 | Stop: 2022-06-03

## 2022-06-03 MED ADMIN — acetaminophen (TYLENOL) tablet 1,000 mg: 1000 mg | ORAL | @ 12:00:00 | Stop: 2022-06-03

## 2022-06-03 MED ADMIN — pantoprazole (Protonix) EC tablet 20 mg: 20 mg | ORAL | @ 15:00:00

## 2022-06-03 MED ADMIN — predniSONE (DELTASONE) tablet 5 mg: 5 mg | ORAL | @ 14:00:00 | Stop: 2022-06-03

## 2022-06-03 MED ADMIN — prochlorperazine (COMPAZINE) injection 5 mg: 5 mg | INTRAVENOUS | @ 12:00:00 | Stop: 2022-06-03

## 2022-06-03 MED ADMIN — losartan (COZAAR) tablet 50 mg: 50 mg | ORAL | @ 15:00:00 | Stop: 2022-06-03

## 2022-06-03 MED ADMIN — HYDROmorphone (PF) (DILAUDID) injection 1 mg: 1 mg | INTRAVENOUS | @ 15:00:00 | Stop: 2022-06-03

## 2022-06-03 NOTE — Unmapped (Signed)
ED Progress Note    Received sign out from previous provider.    Patient Summary: Ryan Moon is a 36 y.o. male with a history of ANCA vasculitis, kidney transplant/flank presented to the emergency department with recent COVID-19 diagnosis on 12/29, with complaint of fever, generalized bodyaches, hematuria, hematochezia, pain all over his body, Tmax at home was apparently 102.5, he has additionally been having gross red blood coming from his rectum that is failed total,  Action List:   Admission    Updates  ED Course as of 06/03/22 1707   Sun Jun 03, 2022   1610 Patient was asking for additional pain medication, provided 1 mg Dilaudid.  Additionally I talked with nursing about getting a UA and the request by urology for it to be sent.   1158 Discussed patient with nephrology, they recommend that he be admitted and started on remdesivir given patient out to the MAO since 0640

## 2022-06-03 NOTE — Unmapped (Signed)
C/o generalized body aches, hematuria and bloody stool since today. Denies using blood thinners. Hx. Kidney transplant     Covid + yesterday at [facility].

## 2022-06-03 NOTE — Unmapped (Signed)
[facility]  Emergency Department Provider Note     HPI     Ryan Moon is a very pleasant 36 y.o. male  has a past medical history of Renal vasculitis (CMS-HCC) and Secondary hyperparathyroidism (CMS-HCC).    Patient with history of ANCA vasculitis, kidney transplant in 2020 presents to the emergency department today with a recent COVID-19 diagnosis on 12/29 with complaint of fever, generalized bodyaches, hematuria, hematochezia and pain all over his body.  He states his Tmax was 102.5 taken via infrared thermometer yesterday for which she has been taking Tylenol at home.  Patient states that he sat down on the toilet today to urinate and had gross red blood coming from his rectum that filled the toilet bowl, this happened 3 times today.  He is also having gross blood in his urine.      MDM     BP 105/76  - Pulse 76  - Temp 36.8 ??C (98.2 ??F) (Oral)  - Resp 17  - SpO2 95%      On exam, patient appears to be in mild to moderate discomfort but no acute distress. Given patient's history, presentation, and physical exam, this patient is presenting with fever in an immunocompromise patient.  Differential is broad and includes infectious etiology such as URI, UTI, sepsis, bacteremia, urosepsis.  Ordered blood cultures to assess for bacteremia. Patient likely not septic given normal lactate, stable vitals.  Considered pneumonia but doubt given patient's normal chest x-ray.  Considered tumor lysis syndrome but think less likely given reassuring labs.  Considered skin infection (cellulitis, abscess, necrotizing fasciitis) but doubt given lack of significant skin findings, no rash, no erythema or redness, no palpable mass with fluctuance, no pain out of proportion to exam. Considered but doubt PE given no tachycardia, no unilateral leg swelling, no shortness of breath report or chest pain.  Will restart patient's prednisone and treat headache with Tylenol, Compazine.  Patient having diffuse pain all over his body, will proceed with Dilaudid to avoid morphine secondary to renal metabolites    Diagnostic workup as below.     Disposition pending further work-up, however anticipate admission for immunocompromised fever    Orders Placed This Encounter   Procedures    Blood Culture    Blood Culture    XR Chest 2 views    US Renal Transplant W Doppler    CBC w/ Differential    Comprehensive Metabolic Panel    Lactate Sepsis, Venous    Urinalysis with Microscopy with Culture Reflex    DIC PROFILE    aPTT    PT-INR    Fibrinogen    D-Dimer, Quantitative    CMV DNA, quantitative, PCR    Misc nursing order (Sepsis Timer)    Cardiac Monitor    Oxygen sat continuous monitoring    Notify Provider    In and Out (I & O) cath    Notify Provider    Obtain medical records    Vital signs    RN to notify pharmacy immediately that an antibiotic has been ordered from the Sepsis Order Set (if not available in the [employer] or if not yet verified)    Inpatient consult to Nephrology    Special Airborne/Contact Isolation Status    Droplet Isolation Status    ECG 12 Lead    Insert peripheral IV       ED Course as of 06/03/22 0653   Sun Jun 03, 2022   0343 EKG showing NSR with  a rate of 87 bpm, normal axis and normal intervals, normal R wave progression.  No evidence of acute ischemia, Brugada syndrome, hypertrophy, preexcitation syndrome.   0518 Grossly normal CXR without any focal consolidations, no pleural effusions, no mediastinal widening, no free air, no obvious rib fractures, no pulmonary edema, no cardiomegaly.   9811 Renal ultrasound showing layering debris in bladder concerning for blood   0546 Hemoglobin stable at 12.3   0557 Hemoccult positive, no evidence of hemorrhoids or anal fissure   0610 Patient still complaining of headache, Tylenol and Compazine ordered.  Will defer Toradol at this time in setting of renal transplant   0620 Transplant surgery team paged.  Recommend consulting nephrology.  Nephrology has been paged.   9147 Spoke with nephrology resident who will evaluate the patient.   8295 Patient was signed out to oncoming provider pending UA, nephrology recommendations and admission in setting of immunocompromise fever with hematochezia and hematuria       The case was discussed with the attending physician who is in agreement with the above assessment and plan.    - Any discussion of this patient's case/presentation between myself and consultants, admitting teams, or other team members has been documented above.  - Imaging and other studies, if performed, that were available during my care of the patient were independently reviewed and interpreted by me and considered in my medical decision making as documented above.  - External records reviewed: Kidney transplant 2020, history of ANCA vasculitis on belatacept and prednisone     ED Clinical Impression     Final diagnoses:   Hematuria, unspecified type (Primary)   Immunocompromised (CMS-HCC)   Fever, unspecified fever cause        Physical Exam     Vitals:    06/03/22 0050 06/03/22 0115 06/03/22 0313 06/03/22 0552   BP:  127/86 128/89 105/76   Pulse: 108  87 76   Resp:  18  17   Temp:  36.8 ??C (98.2 ??F) 36.8 ??C (98.2 ??F)    TempSrc:  Oral Oral    SpO2: 97%  100% 95%        Constitutional: Moderate discomfort, nontoxic, no acute distress.  Eyes: Conjunctivae are normal. EOMI.   HEENT: Normocephalic and atraumatic. Mucous membranes are moist.   Neck: Full active range of motion  Cardiovascular: See heart rate listed above.  No murmurs appreciated.  Normal skin perfusion.   Respiratory: See respiratory rate listed above.  Lungs are clear to auscultation bilaterally.  Speaking easily in full sentences  Gastrointestinal: Soft, non-distended, generalized tenderness to palpation diffusely.  No guarding.  No rebound.  Musculoskeletal: No long bone deformities.   Neurologic: Normal speech and language. No gross focal neurologic deficits are appreciated.   Skin: Skin is warm, dry and intact.  Psychiatric: Mood and affect are normal.     Past History     PAST MEDICAL HISTORY/PAST SURGICAL HISTORY:   Past Medical History:   Diagnosis Date    Renal vasculitis (CMS-HCC)     Secondary hyperparathyroidism (CMS-HCC)        Past Surgical History:   Procedure Laterality Date    APPENDECTOMY  2013    NEPHRECTOMY TRANSPLANTED ORGAN      PR COLSC FLX W/RMVL OF TUMOR POLYP LESION SNARE TQ N/A 07/07/2021    Procedure: COLONOSCOPY FLEX; W/REMOV TUMOR/LES BY SNARE;  Surgeon: Maris Berger, MD;  [facility];  Service: Gastroenterology    PR TRANSPLANT,PREP CADAVER RENAL  GRAFT Right 04/28/2019    Procedure: BACKBNCH STD PREP CAD DONR RENAL ALLOGFT PRIOR TO TRNSPLNT, INCL DISSEC/REM PERINEPH FAT, DIAPH/RTPER ATTAC;  Surgeon: Doyce Loose, MD;  Location: MAIN OR [place];  Service: Transplant    PR TRANSPLANTATION OF KIDNEY Right 04/28/2019    Procedure: RENAL ALLOTRANSPLANTATION, IMPLANTATION OF GRAFT; WITHOUT RECIPIENT NEPHRECTOMY;  Surgeon: Doyce Loose, MD;  Location: MAIN OR [place];  Service: Transplant       MEDICATIONS:     Current Facility-Administered Medications:     acetaminophen (TYLENOL) tablet 1,000 mg, 1,000 mg, Oral, Once, Jamesetta Greenhalgh J, DO    ondansetron [facility]) injection 4 mg, 4 mg, Intravenous, Once PRN, Keyshawna Prouse J, DO    predniSONE (DELTASONE) tablet 5 mg, 5 mg, Oral, Daily, Chijioke Lasser J, DO    prochlorperazine (COMPAZINE) injection 5 mg, 5 mg, Intravenous, Once, Corinna Lines, DO    Current Outpatient Medications:     ALPRAZolam (XANAX) 0.5 MG tablet, Take 1 tablet (0.5 mg total) by mouth two (2) times a day as needed for anxiety., Disp: 10 tablet, Rfl: 0    amlodipine (NORVASC) 5 MG tablet, Take 1 tablet (5 mg total) by mouth every evening., Disp: 30 tablet, Rfl: 11    belatacept (NULOJIX) 250 mg SolR, Belatacept  5 mg/kg every 2 weeks for 5 doses then once every 4 weeks, Disp: 14.5 mL, Rfl: 0    losartan (COZAAR) 50 MG tablet, Take 1 tablet (50 mg total) by mouth Two (2) times a day., Disp: 60 tablet, Rfl: 11    magnesium oxide-Mg AA chelate (MAGNESIUM, AMINO ACID CHELATE,) 133 mg, Take 1 tablet by mouth daily., Disp: 90 tablet, Rfl: 3    omeprazole (PRILOSEC) 20 MG capsule, Take 1 capsule (20 mg total) by mouth daily., Disp: 30 capsule, Rfl: 11    ondansetron (ZOFRAN-ODT) 4 MG disintegrating tablet, Take 1 tablet (4 mg total) by mouth every eight (8) hours as needed., Disp: 30 tablet, Rfl: 0    predniSONE (DELTASONE) 5 MG tablet, Take 1 tablet (5 mg total) by mouth daily., Disp: 30 tablet, Rfl: 11    ALLERGIES:   Ibuprofen and Acetaminophen    SOCIAL HISTORY:   Social History     Tobacco Use    Smoking status: Former     Current packs/day: 0.00     Types: Cigarettes     Quit date: 05/22/2018     Years since quitting: 4.0    Smokeless tobacco: Former     Types: Chew   Substance Use Topics    Alcohol use: Yes     Alcohol/week: 1.0 standard drink of alcohol     Types: 1 Cans of beer per week     Comment: Patient had a DUI at age 67. Until 5/16 patient would have 1-2 beers per day.        FAMILY HISTORY:  Family History   Problem Relation Age of Onset    Kidney disease Neg Hx          Radiology     XR Chest 2 views   Preliminary Result      No acute cardiopulmonary abnormalities.      US Renal Transplant W Doppler   Preliminary Result   No significant change compared with prior study. Stable resistive indices in the renal transplant arteries, within normal limits.      Echogenic, layering debris within the bladder, which may represent hemorrhage. Differential includes includes infection versus chronic urinary stasis.  Please see below for data measurements:      Transplant location: RLQ      Renal Transplant: Sagittal  11.7   cm; AP  5.7   cm; Transverse  5.6   cm      Segmental artery superior resistive index: 0.61   Segmental artery mid resistive index: 0.70   Segmental artery inferior resistive index: 0.64      Previous resistive indices range of segmental arteries:  0.67-0.74        Main renal artery peak systolic velocity at anastomosis: 2.93 m/s   Main renal artery hilum resistive index: 0.73   Main renal artery mid resistive index: 0.70   Main renal artery anastomosis resistive index: 0.70      Previous resistive indices range of main renal artery:  0.70-0.78        Main renal vein: patent      Iliac artery: Patent   Iliac vein: Patent      Bladder volume prevoid:  219.5    mL                 Laboratory Data     Lab Results   Component Value Date    WBC 2.3 (L) 06/03/2022    HGB 12.3 (L) 06/03/2022    HCT 35.2 (L) 06/03/2022    PLT 207 06/03/2022       Lab Results   Component Value Date    NA 133 (L) 06/03/2022    K 3.4 06/03/2022    CL 100 06/03/2022    CO2 27.0 06/03/2022    BUN 20 06/03/2022    CREATININE 2.11 (H) 06/03/2022    GLU 97 06/03/2022    CALCIUM 9.1 06/03/2022    MG 2.1 05/30/2022    PHOS 3.2 05/30/2022       Lab Results   Component Value Date    BILITOT 0.3 06/03/2022    BILIDIR <0.10 05/30/2022    PROT 8.1 06/03/2022    ALBUMIN 4.2 06/03/2022    ALT 36 06/03/2022    AST 41 (H) 06/03/2022    ALKPHOS 95 06/03/2022    GGT 63 06/15/2020       Lab Results   Component Value Date    INR 0.98 06/03/2022    APTT 29.6 06/03/2022       Kristopher Glee, DO  Emergency Medicine, PGY-1    Portions of this record have been created using Dragon dictation software. Dictation errors have been sought, but may not have been identified and corrected.         Corinna Lines, DO  Resident  06/03/22 [phone]

## 2022-06-03 NOTE — Unmapped (Addendum)
Ryan Moon is a 36 y.o. 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 He was treated for COVID19 with Remdesivir and will follow up outpatient to continue workup for kidney rejection with a biopsy after resolution of COVID infection. Please find below a summary of the problems addressed during this admission.     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 He noted he was having hematuria 12/25-12/27. Cr 2.11 on admission, baseline had been 1.8 prior to outpt visit 12/27. Increase in proteinuria and hematuria noted on 12/27 which may be due to rejection vs recurrence of vasculitis in the transplant kidney. Workup for rejection was begun with studies including ANCA, Urinalysis and will follow up outpatient for a renal biopsy. He is scheduled for IV Belantacept 250mg  monthly (next due 06/13/21). Prednisone 5mg  daily was continued. Antimetabolics not given iso leukopenia.      COVID-19   Pt has had general malaise for 5 days, tested positive for COVID19 at an urgent care 12/28 and was started on Molnupiravir (Lagevrio) 800mg  BID. He had a fever at home reported to be 102F, denied any respiratory symptoms and lungs are clear to auscultation bilat on admission. CXR unremarkable, no hypoxemia. He received 3 days of remdesivir, and will complete his course of with molnupiravir for 3 more days outpatient after discharge.      Hematochezia due to Hemorrhoidal Bleeding  Pt reported episodes of BRBPR twice last night which filled toilet bowl. He denied pain with defecation. Admitted 07/05/21 for BRBPR, colonoscopy 07/07/21 showed nonbleeding external and internal hemorrhoids, considered likely etiology of rectal bleeding. It was also noted that he had 4 sessile polyps removed and insufficient prep to remove smaller polyps. Hgb stable on admission at 12.3 (11.9 at outpatient visit 12/27). BRBPR likely recurrence of hemorrhoidal bleeding and did not recur during admission. No further workup was required.      Chronic Problems  Hx panic attacks, anxiety: Continued home xanax 0.5mg  BID PRN  GERD: Held home omeprazole, started on equivalent Pantoprazole 20mg  daily  HTN: Holding home meds iso normotension, will CTM. Restart Amlodipine 5mg  nightly at discharge. Holding losartan 50mg  BID due to AKI and consider restarting outpatient.

## 2022-06-03 NOTE — Unmapped (Signed)
Transplant Nephrology Consult     Requesting Attending Physician :  Ellen Henri, MD  Service Requesting Consult : Emergency Medicine  Reason for Consult: Increased Cr , Immunosuppression Mgmt      Assessment/Recommendations: Ryan Moon is a 36 y.o. 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    # Kidney allograft function, Hematuria: Recent increase in sCr from baseline around 1.8 up to 2.1, with increasing UPCR from baseline around 0.4 up to 1.2 as well as worsening hematuria noted on UA. No DSAs as of 12/27. No prior rejection. Has previously received cytoxan with plan for urology eval in 2024.  - Serum Cr 2.1 on arrival  - I am concerned about his recent uptick in proteinuria as well as worsening hematuria, either of which can be manifestations of rejection OR AAV, which can recur in the transplanted kidney. We will plan for a biopsy on Tuesday AM under moderate sedation. On Monday evening please obtain CBC, BMP, Type and Screen with ABO/Rh, INR/PTT. Please hold DVT ppx on the morning of the procedure (Tuesday).  - Please send ANCA titers    # Immunosuppression:  - On Bela monthly, Pred. Holding antimetabolic due to leukopenia.    # Blood Pressure / Volume:  - BP acceptable at this time, will need to watch closely for hypotension given LGIB (although not consistent) as well as fever, COVID.    # Infectious Prophylaxis and Monitoring:   - CMV and BK PCRs neg 12/27  - Ucx neg 12/27    # COVID: + testing yesterday. Symptoms noted starting Monday. Suspect this is the primary driver of his fever. Unfortunately can provoke ANCA. Not hypoxemia, CXR largely unremarkable.  - Remdesivir x 5 days    # Hematochezia: BRBPR x 1 day. Has had this previously 07/2021, at which time CLS noted 4 sessile polyps that were removed, as well as medium-sized nonbleeding external/internal hemorrhoids thought to be the primary cause. Should be noted that at that time prep insufficient for smaller polyps.     **Transplant patients with an open wound require wound care with sterile water only. The patient should be counseled on this at the time of discharge if they have not already been doing this.    Discussed recommendations with primary team.    Elmer Picker, MD  06/03/2022 7:46 AM     Medical decision-making for 06/03/22  Findings / Data     Patient has: []  acute illness w/systemic sxs  [mod]  []  two or more stable chronic illnesses [mod]  []  one chronic illness with acute exacerbation [mod]  []  acute complicated illness  [mod]  []  Undiagnosed new problem with uncertain prognosis  [mod] []  illness posing risk to life or bodily function (ex. AKI)  [high]  []  chronic illness with severe exacerbation/progression  [high]  [x]  chronic illness with severe side effects of treatment  [high] COVID in Immunosuppressed with transplant Probs At least 2:  Probs, Data, Risk   I reviewed: [x]  primary team note  []  consultant note(s)  []  external records [x]  chemistry results  [x]  CBC results  []  blood gas results  []  Other []  procedure/op note(s)   []  radiology report(s)  []  micro result(s)  []  w/ independent historian(s) Cr 2.1, Hb stable, per ED note COVID+ ?3 Data Review (2 of 3)    I independently interpreted: []  Urine Sediment  []   Renal US []  CXR Images  []  CT Images  []  Other []  EKG Tracing  Any     I discussed: []  Pathology results w/ QHPs(s) from other specialties  []  Procedural findings w/ QHPs(s) from other specialties []  Imaging w/ QHP(s) from other specialties  [x]  Treatment plan w/ QHP(s) from other specialties Plan discussed with primary team Any     Mgm't requires: []  Prescription drug(s)  [mod]  []  Kidney biopsy  [mod]  []  Central line placement  [mod] [x]  High risk medication use and/or intensive toxicity monitoring [high]  []  Renal replacement therapy [high]  []  High risk kidney biopsy  [high]  []  Escalation of care  [high]  []  High risk central line placement  [high] Immunosuppression: high risk for infection Risk      _____________________________________________________________________________________    History of Present Illness:  Ryan Moon is a/an 36 y.o. 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 Nephrology has been consulted for hematuria and incr Cr. The patient has presented to [facility] with several days of myalgias and fever with recent COVID dx as well as hematuria. Hematuria started Monday and was gross/painless and brown/red, has since improved. Hasn't had this before, not on blood thinners, not a symptom of his vasculitis prior, no hemoptysis, no new rash, oral ulcers, nasal ulcers. May have had some bloody crusting. His UPCR has increased recently, from < 500mg /g to now >1g/g, and his sCr was previously around 1.8 and is now 2.1. In context of this there was concern about potentially needing a bx from his outpatient team. No graft pain.      INPATIENT MEDICATIONS:    Current Facility-Administered Medications:     ondansetron (ZOFRAN) injection 4 mg, Intravenous, Once PRN    predniSONE (DELTASONE) tablet 5 mg, Oral, Daily    OUTPATIENT MEDICATIONS:  Prior to Admission medications    Medication Dose, Route, Frequency   ALPRAZolam (XANAX) 0.5 MG tablet 0.5 mg, Oral, 2 times a day PRN   amlodipine (NORVASC) 5 MG tablet 5 mg, Oral, Every evening   belatacept (NULOJIX) 250 mg SolR Belatacept  5 mg/kg every 2 weeks for 5 doses then once every 4 weeks   losartan (COZAAR) 50 MG tablet Take 1 tablet (50 mg total) by mouth Two (2) times a day.   magnesium oxide-Mg AA chelate (MAGNESIUM, AMINO ACID CHELATE,) 133 mg Take 1 tablet by mouth daily.   omeprazole (PRILOSEC) 20 MG capsule 20 mg, Oral, Daily (standard)   ondansetron (ZOFRAN-ODT) 4 MG disintegrating tablet 4 mg, Oral, Every 8 hours PRN   predniSONE (DELTASONE) 5 MG tablet 5 mg, Oral, Daily (standard)        ALLERGIES:  Ibuprofen and Acetaminophen    MEDICAL HISTORY:  Past Medical History:   Diagnosis Date    Renal vasculitis (CMS-HCC)     Secondary hyperparathyroidism (CMS-HCC)      Past Surgical History:   Procedure Laterality Date    APPENDECTOMY  2013    NEPHRECTOMY TRANSPLANTED ORGAN      PR COLSC FLX W/RMVL OF TUMOR POLYP LESION SNARE TQ N/A 07/07/2021    Procedure: COLONOSCOPY FLEX; W/REMOV TUMOR/LES BY SNARE;  Surgeon: Maris Berger, MD;  Location: [facility];  Service: Gastroenterology    PR TRANSPLANT,PREP CADAVER RENAL GRAFT Right 04/28/2019    Procedure: [facility] STD  PREP CAD DONR RENAL ALLOGFT PRIOR TO TRNSPLNT, INCL DISSEC/REM PERINEPH FAT, DIAPH/RTPER ATTAC;  Surgeon: Doyce Loose, MD;  Location: [facility];  Service: Transplant    PR TRANSPLANTATION OF KIDNEY Right 04/28/2019    Procedure: RENAL ALLOTRANSPLANTATION, IMPLANTATION OF GRAFT; WITHOUT RECIPIENT NEPHRECTOMY;  Surgeon: Doyce Loose, MD;  Location: MAIN OR [facility];  Service: Transplant     SOCIAL HISTORY  Social History     Social History Narrative    Works in Insurance account manager business.  Married and lives in [place], [place] with wife and 3 children.  Prior substance abuse with DUI age 1.  Until 5:16 PM patient would have 1-2 beers per day however none currently.  Prior to 2017 patient snorted cocaine once every 2 to 4 months for several years and denies use since 2017.  Patient also reports having smoked marijuana daily until August 2019.  Former cigarette use and smokeless tobacco in the form of chewing tobacco quit 05/22/2018.      reports that he quit smoking about 4 years ago. His smoking use included cigarettes. He has quit using smokeless tobacco.  His smokeless tobacco use included chew. He reports current alcohol use of about 1.0 standard drink of alcohol per week. He reports current drug use. Drug: Marijuana.   FAMILY HISTORY  Family History   Problem Relation Age of Onset    Kidney disease Neg Hx         Physical Exam:   Vitals:    06/03/22 0050 06/03/22 0115 06/03/22 0313 06/03/22 0552   BP:  127/86 128/89 105/76   Pulse: 108  87 76   Resp:  18  17   Temp:  36.8 ??C (98.2 ??F) 36.8 ??C (98.2 ??F)    TempSrc:  Oral Oral    SpO2: 97%  100% 95%     No intake/output data recorded.  No intake or output data in the 24 hours ending 06/03/22 0746    General: well-appearing, no acute distress  HEENT: anicteric sclera, oropharynx clear without lesions  CV: regular rate, normal rhythm, no murmurs, no gallops, no rubs, no peripheral edema  Lungs: clear to auscultation bilaterally, normal work of breathing  Abdomen: soft, non-tender, non-distended, graft site nontender  Psych: alert, engaged, appropriate mood and affect

## 2022-06-03 NOTE — Unmapped (Signed)
2nd L LR ongoing, 1 more after

## 2022-06-04 LAB — CBC
HEMATOCRIT: 30.5 % — ABNORMAL LOW (ref 39.0–48.0)
HEMOGLOBIN: 10.8 g/dL — ABNORMAL LOW (ref 12.9–16.5)
MEAN CORPUSCULAR HEMOGLOBIN CONC: 35.3 g/dL (ref 32.0–36.0)
MEAN CORPUSCULAR HEMOGLOBIN: 32.4 pg (ref 25.9–32.4)
MEAN CORPUSCULAR VOLUME: 91.8 fL (ref 77.6–95.7)
MEAN PLATELET VOLUME: 7.1 fL (ref 6.8–10.7)
PLATELET COUNT: 189 10*9/L (ref 150–450)
RED BLOOD CELL COUNT: 3.33 10*12/L — ABNORMAL LOW (ref 4.26–5.60)
RED CELL DISTRIBUTION WIDTH: 13.7 % (ref 12.2–15.2)
WBC ADJUSTED: 1.9 10*9/L — ABNORMAL LOW (ref 3.6–11.2)

## 2022-06-04 LAB — URINALYSIS WITH MICROSCOPY WITH CULTURE REFLEX
BACTERIA: NONE SEEN /HPF
BILIRUBIN UA: NEGATIVE
GLUCOSE UA: NEGATIVE
KETONES UA: NEGATIVE
NITRITE UA: NEGATIVE
PH UA: 6.5 (ref 5.0–9.0)
PROTEIN UA: 50 — AB
RBC UA: 123 /HPF — ABNORMAL HIGH (ref <=3)
SPECIFIC GRAVITY UA: 1.005 (ref 1.003–1.030)
SQUAMOUS EPITHELIAL: <1 /HPF (ref 0–5)
UROBILINOGEN UA: <2
WBC UA: 4 /HPF — ABNORMAL HIGH (ref <=2)

## 2022-06-04 LAB — URINALYSIS, MACROSCOPIC
BILIRUBIN UA: NEGATIVE
GLUCOSE UA: NEGATIVE
KETONES UA: NEGATIVE
NITRITE UA: NEGATIVE
PH UA: 6 (ref 5.0–9.0)
PROTEIN UA: 70 — AB
SPECIFIC GRAVITY UA: 1.006 (ref 1.003–1.030)
UROBILINOGEN UA: <2

## 2022-06-04 LAB — BASIC METABOLIC PANEL
ANION GAP: 6 mmol/L (ref 5–14)
BLOOD UREA NITROGEN: 17 mg/dL (ref 9–23)
BUN / CREAT RATIO: 8
CALCIUM: 8.5 mg/dL — ABNORMAL LOW (ref 8.7–10.4)
CHLORIDE: 103 mmol/L (ref 98–107)
CO2: 27 mmol/L (ref 20.0–31.0)
CREATININE: 2.01 mg/dL — ABNORMAL HIGH
EGFR CKD-EPI (2021) MALE: 43 mL/min/{1.73_m2} — ABNORMAL LOW (ref >=60)
GLUCOSE RANDOM: 86 mg/dL (ref 70–179)
POTASSIUM: 3.8 mmol/L (ref 3.4–4.8)
SODIUM: 136 mmol/L (ref 135–145)

## 2022-06-04 LAB — PROTEIN / CREATININE RATIO, URINE
CREATININE, URINE: 52.4 mg/dL
PROTEIN URINE: 84.4 mg/dL
PROTEIN/CREAT RATIO, URINE: 1.611

## 2022-06-04 MED ADMIN — pantoprazole (Protonix) EC tablet 20 mg: 20 mg | ORAL | @ 14:00:00

## 2022-06-04 MED ADMIN — predniSONE (DELTASONE) tablet 5 mg: 5 mg | ORAL | @ 14:00:00

## 2022-06-04 MED ADMIN — oxyCODONE (ROXICODONE) immediate release tablet 10 mg: 10 mg | ORAL | @ 02:00:00 | Stop: 2022-06-17

## 2022-06-04 MED ADMIN — oxyCODONE (ROXICODONE) immediate release tablet 10 mg: 10 mg | ORAL | @ 14:00:00 | Stop: 2022-06-04

## 2022-06-04 MED ADMIN — remdesivir (VEKLURY) 100 mg in sodium chloride (NS) 0.9 % 295 mL IVPB: 100 mg | INTRAVENOUS | @ 14:00:00 | Stop: 2022-06-08

## 2022-06-04 MED ADMIN — ondansetron (ZOFRAN-ODT) disintegrating tablet 4 mg: 4 mg | ORAL | @ 23:00:00

## 2022-06-04 MED ADMIN — oxyCODONE (ROXICODONE) immediate release tablet 10 mg: 10 mg | ORAL | @ 08:00:00 | Stop: 2022-06-04

## 2022-06-04 MED ADMIN — traMADol (ULTRAM) tablet 50 mg: 50 mg | ORAL | @ 20:00:00

## 2022-06-04 NOTE — Unmapped (Signed)
Nephrology (MED[facility]) Progress Note    Assessment & Plan:   NYRELL KNEELAND is a 37 y.o. 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      Principal Problem:    AKI (acute kidney injury) (CMS-HCC)  Active Problems:    Kidney transplant recipient    Immunosuppression (CMS-HCC)    Crescentic glomerulonephritis    Essential hypertension    Renal vasculitis (CMS-HCC)    Microscopic hematuria    Hemorrhoids    Hematuria    Hematochezia    COVID-19  Resolved Problems:    * No resolved hospital problems. 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 He noted he was having hematuria 12/25-12/27. Cr 2.11 on admission, baseline had been 1.8 prior to outpt visit 12/27. Increase in proteinuria and hematuria noted on 12/27 which may be due to rejection vs recurrence of vasculitis in the transplant kidney. Obtaining urine studies this AM.   - Follow up ANCA   - Follow up CMV  - Follow up UA/Cx  Immunosuppression:   - IV Belantacept 250mg  monthly (next due 06/13/21)  - Prednisone 5mg  daily   - Antimetabolics not given iso leukopenia      COVID-19   Pt has had general malaise for 5 days, tested positive for COVID19 at an urgent care 12/28 and was started on Molnupiravir (Lagevrio) 800mg  BID. He had a fever at home reported to be 102F, denies any respiratory symptoms and lungs are clear to auscultation bilat on admission. CXR unremarkable, no hypoxemia. Tentative plan for 5 days of remdesivir, although may shorten iso prior molnupiravir and clinical improvement.  -Remdesivir 200mg  once 12/31, then 100mg  daily (1/1-1/4)  -CBC daily     Hematochezia   Pt reported episodes of BRBPR twice last night which filled toilet bowl. He denied pain with defecation. Admitted 07/05/21 for BRBPR, colonoscopy 07/07/21 showed nonbleeding external and internal hemorrhoids, considered likely etiology of rectal bleeding. It was also noted that he had 4 sessile polyps removed and insufficient prep to remove smaller polyps. Hgb stable on admission at 12.3 (11.9 at outpatient visit 12/27). BRBPR likely recurrence of hemorrhoidal bleeding, will CTM. Has not had any more bleeding since admission. Hgb 10.8 this AM.   -CTM  - CBC daily  -Active Type and Screen     Chronic Problems  Hx panic attacks, anxiety: home xanax 0.5mg  BID PRN  GERD: Held home omeprazole, started on equivalent Pantoprazole 20mg  daily  HTN: Holding home meds iso normotension, will CTM. Home meds: Amlodipine 5mg  nightly, losartan 50mg  BID    Daily Checklist:  Diet: Regular Diet  DVT PPx: Heparin 5000units q8h  Electrolytes: No Repletion Needed  Code Status: Full Code  Dispo: Home    Team Contact Information:   Primary Team: Nephrology (MEDB)  Primary Resident: Rande Lawman, MD, MD  Resident's Pager: [phone] (Nephrology Intern - Alvester Morin)    Interval History:   No acute events overnight.    Pt says he feels much better today.  Denies any shortness of breath and body aches improving.     All other systems were reviewed and are negative except as noted in the HPI    Objective:   Temp:  [36.4 ??C (97.5 ??F)-37 ??C (98.6 ??F)] 36.7 ??C (98.1 ??F)  Heart Rate:  [56-76]  76  Resp:  [16-20] 18  BP: (118-127)/(78-87) 122/83  SpO2:  [90 %-100 %] 95 %    Gen: Resting comfortably in bed  HENT: atraumatic, normocephalic  Heart: RRR  Lungs: CTAB, no crackles or wheezes  Abdomen: soft, NTND  Extremities: No edema

## 2022-06-04 NOTE — Unmapped (Signed)
Nephrology (MED[facility]) History & Physical    Assessment & Plan:   Ryan Moon is a 37 y.o. 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     Principal Problem:    AKI (acute kidney injury) (CMS-HCC)  Active Problems:    Kidney transplant recipient    Immunosuppression (CMS-HCC)    Crescentic glomerulonephritis    Essential hypertension    Renal vasculitis (CMS-HCC)    Microscopic hematuria    Hemorrhoids    Hematuria    Hematochezia    COVID-19  Resolved Problems:    * No resolved hospital problems. 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 He noted he was having hematuria 12/25-12/27. Cr 2.11 on admission, baseline had been 1.8 prior to outpt visit 12/27. Increase in proteinuria and hematuria noted on 12/27 which may be due to rejection vs recurrence of vasculitis in the transplant kidney.   - Follow up ANCA   - Follow up CMV  - Follow up UA/Cx  Immunosuppression:   - IV Belantacept 250mg  monthly (next due 06/13/21)  - Prednisone 5mg  daily   - Antimetabolics not given iso leukopenia     COVID-19   Pt has had general malaise for 5 days, tested positive for COVID19 at an urgent care 12/28 and was started on Molnupiravir (Lagevrio) 800mg  BID. He had a fever at home reported to be 102F, denies any respiratory symptoms and lungs are clear to auscultation bilat on admission. CXR unremarkable, no hypoxemia.   -Start remdesivir 200mg  once, then 100mg  daily   -CBC daily    Hematochezia   Pt reported episodes of BRBPR twice last night which filled toilet bowl. He denied pain with defecation. Admitted 07/05/21 for BRBPR, colonoscopy 07/07/21 showed nonbleeding external and internal hemorrhoids, considered likely etiology of rectal bleeding. It was also noted that he had 4 sessile polyps removed and insufficient prep to remove smaller polyps. Hgb stable on admission at 12.3 (11.9 at outpatient visit 12/27). BRBPR likely recurrence of hemorrhoidal bleeding, will CTM.   -CTM  - CBC daily  -Active Type and Screen    Chronic Problems  Hx panic attacks, anxiety: home xanax 0.5mg  BID PRN  GERD: Held home omeprazole, started on equivalent Pantoprazole 20mg  daily  HTN: Holding home meds iso normotension, will CTM. Home meds: Amlodipine 5mg  nightly, losartan 50mg  BID    The patient's presentation is complicated by the following clinically significant conditions requiring additional evaluation and treatment: - Chronic kidney disease POA requiring further investigation, treatment, or monitoring      Checklist:  Diet: Regular Diet  DVT PPx: Heparin 5000units q8h  Code Status: Full Code  Dispo: Patient appropriate for inpatient    Team Contact Information:   Primary Team: Nephrology (MEDB)  Primary Resident: Rande Lawman, MD  Resident's Pager: [phone] (Nephrology Intern - Alvester Morin)    Chief Concern:   AKI (acute kidney injury) (CMS-HCC)      Subjective:   Ryan Moon is a 37 y.o. 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     History obtained from patient.     HPI:  The patient presented to [facility] with myalgias, reported fevers, recent COVID 19 diagnosis (12/28), hematuria and hematochezia.  Myalgias, fevers, and hematuria began on Monday  12/25.  The patient felt slightly better on Wednesday and went to see his nephrologist in clinic.  The hematuria lasted until 12/27 was painless and brown/red in color.  As myalgias increased he went to urgent care where he was diagnosed with COVID 19 and started on molnupiravir.  He denies any respiratory symptoms.  Last night he had 2 episodes of bright red blood per rectum which prompted him to message his transplant nephrologist, Dr. Toni Arthurs who directed him to report to the ED.  He denies any nausea/vomiting or diarrhea.  He continues to have significant myalgias and headache but denies any specific flank pain.  On admission his creatinine is 2.1 from previous baseline of around 1.8.       Pertinent Surgical Hx  -renal transplant 04/2019    Pertinent Family Hx  N/A    Pertinent Social Hx   Lives at home with his wife, former smoker (quit 2019), no current drug use    Allergies  Ibuprofen and Acetaminophen    I reviewed the Medication List. The current list is Accurate  Prior to Admission medications    Medication Dose, Route, Frequency   ALPRAZolam (XANAX) 0.5 MG tablet 0.5 mg, Oral, 2 times a day PRN   amlodipine (NORVASC) 5 MG tablet 5 mg, Oral, Every evening   belatacept (NULOJIX) 250 mg SolR Belatacept  5 mg/kg every 2 weeks for 5 doses then once every 4 weeks   losartan (COZAAR) 50 MG tablet Take 1 tablet (50 mg total) by mouth Two (2) times a day.   magnesium oxide-Mg AA chelate (MAGNESIUM, AMINO ACID CHELATE,) 133 mg Take 1 tablet by mouth daily.   omeprazole (PRILOSEC) 20 MG capsule 20 mg, Oral, Daily (standard)   ondansetron (ZOFRAN-ODT) 4 MG disintegrating tablet 4 mg, Oral, Every 8 hours PRN   predniSONE (DELTASONE) 5 MG tablet 5 mg, Oral, Daily (standard)       Librarian, academic:  Mr. Bentz currently has decisional capacity for healthcare decision-making and is able to designate a surrogate healthcare decision maker. Mr. Rushton designated healthcare decision maker(s) is/are Antarius Eklof (the patient's spouse) as denoted by stated patient preference.    Objective:   Physical Exam:  Temp:  [36.6 ??C (97.9 ??F)-37 ??C (98.6 ??F)] 36.7 ??C (98.1 ??F)  Heart Rate:  [58-108] 65  Resp:  [14-18] 18  BP: (105-128)/(66-89) 127/83  SpO2:  [91 %-100 %] 95 %    Gen: Resting in bed, appearing uncomfortable  Eyes: Sclera anicteric, EOMI grossly normal   HENT: Atraumatic, normocephalic  Neck: Trachea midline  Heart: RRR  Lungs: CTAB, no crackles or wheezes  Abdomen: Soft, NTND  Extremities: No edema  Neuro: Grossly symmetric, non-focal    Skin:  No rashes, lesions on clothed exam  Psych: Alert, oriented

## 2022-06-04 NOTE — Unmapped (Signed)
PT is alert and orientated x4, VS stable, complains of headache this am, oxy given. UA sent per MD orders. Pt complains of headache again refuses tylenol even with premedication of Zofran to help with nausea, md paged. Tramadol ordered and given for headache at 1508. Pt independent in room, voids in urinal. Remains on spec air/contact precautions. Pt didn't eat breakfast or lunch but did order dinner.     Bed in low position, wheels locked, call bell within reach. Will continue to monitor.   Problem: Adult Inpatient Plan of Care  Goal: Plan of Care Review  Outcome: Progressing  Goal: Patient-Specific Goal (Individualized)  Outcome: Progressing  Goal: Absence of Hospital-Acquired Illness or Injury  Outcome: Progressing  Intervention: Identify and Manage Fall Risk  Recent Flowsheet Documentation  Taken 06/04/2022 1200 by Talbert Cage, RN  Safety Interventions:   fall reduction program maintained   isolation precautions   infection management   low bed   nonskid shoes/slippers when out of bed  Taken 06/04/2022 1000 by Talbert Cage, RN  Safety Interventions:   fall reduction program maintained   low bed   lighting adjusted for tasks/safety   isolation precautions   infection management   nonskid shoes/slippers when out of bed  Taken 06/04/2022 0800 by Talbert Cage, RN  Safety Interventions:   fall reduction program maintained   low bed   lighting adjusted for tasks/safety   nonskid shoes/slippers when out of bed   room near unit station  Goal: Optimal Comfort and Wellbeing  Outcome: Progressing  Goal: Readiness for Transition of Care  Outcome: Progressing  Goal: Rounds/Family Conference  Outcome: Progressing     Problem: Infection  Goal: Absence of Infection Signs and Symptoms  Outcome: Progressing  Intervention: Prevent or Manage Infection  Recent Flowsheet Documentation  Taken 06/04/2022 1200 by Talbert Cage, RN  Infection Management: aseptic technique maintained  Isolation Precautions: spec airborne/contact precautions maintained  Taken 06/04/2022 1000 by Talbert Cage, RN  Infection Management: aseptic technique maintained  Isolation Precautions: spec airborne/contact precautions maintained  Taken 06/04/2022 0800 by Talbert Cage, RN  Infection Management: aseptic technique maintained  Isolation Precautions: spec airborne/contact precautions maintained     Problem: Wound  Goal: Optimal Coping  Outcome: Progressing  Goal: Optimal Functional Ability  Outcome: Progressing  Goal: Absence of Infection Signs and Symptoms  Outcome: Progressing  Intervention: Prevent or Manage Infection  Recent Flowsheet Documentation  Taken 06/04/2022 1200 by Talbert Cage, RN  Infection Management: aseptic technique maintained  Isolation Precautions: spec airborne/contact precautions maintained  Taken 06/04/2022 1000 by Talbert Cage, RN  Infection Management: aseptic technique maintained  Isolation Precautions: spec airborne/contact precautions maintained  Taken 06/04/2022 0800 by Talbert Cage, RN  Infection Management: aseptic technique maintained  Isolation Precautions: spec airborne/contact precautions maintained  Goal: Improved Oral Intake  Outcome: Progressing  Goal: Optimal Pain Control and Function  Outcome: Progressing  Goal: Skin Health and Integrity  Outcome: Progressing  Goal: Optimal Wound Healing  Outcome: Progressing

## 2022-06-05 LAB — CBC
HEMATOCRIT: 29.5 % — ABNORMAL LOW (ref 39.0–48.0)
HEMOGLOBIN: 10.5 g/dL — ABNORMAL LOW (ref 12.9–16.5)
MEAN CORPUSCULAR HEMOGLOBIN CONC: 35.7 g/dL (ref 32.0–36.0)
MEAN CORPUSCULAR HEMOGLOBIN: 32.4 pg (ref 25.9–32.4)
MEAN CORPUSCULAR VOLUME: 90.6 fL (ref 77.6–95.7)
MEAN PLATELET VOLUME: 6.9 fL (ref 6.8–10.7)
PLATELET COUNT: 179 10*9/L (ref 150–450)
RED BLOOD CELL COUNT: 3.26 10*12/L — ABNORMAL LOW (ref 4.26–5.60)
RED CELL DISTRIBUTION WIDTH: 13.4 % (ref 12.2–15.2)
WBC ADJUSTED: 2 10*9/L — ABNORMAL LOW (ref 3.6–11.2)

## 2022-06-05 LAB — BASIC METABOLIC PANEL
ANION GAP: 8 mmol/L (ref 5–14)
BLOOD UREA NITROGEN: 18 mg/dL (ref 9–23)
BUN / CREAT RATIO: 9
CALCIUM: 8.3 mg/dL — ABNORMAL LOW (ref 8.7–10.4)
CHLORIDE: 104 mmol/L (ref 98–107)
CO2: 23 mmol/L (ref 20.0–31.0)
CREATININE: 2.04 mg/dL — ABNORMAL HIGH
EGFR CKD-EPI (2021) MALE: 42 mL/min/{1.73_m2} — ABNORMAL LOW (ref >=60)
GLUCOSE RANDOM: 78 mg/dL (ref 70–179)
POTASSIUM: 3.8 mmol/L (ref 3.4–4.8)
SODIUM: 135 mmol/L (ref 135–145)

## 2022-06-05 LAB — CMV DNA, QUANTITATIVE, PCR: CMV VIRAL LD: NOT DETECTED

## 2022-06-05 LAB — MAGNESIUM: MAGNESIUM: 1.9 mg/dL (ref 1.6–2.6)

## 2022-06-05 LAB — PHOSPHORUS: PHOSPHORUS: 3.2 mg/dL (ref 2.4–5.1)

## 2022-06-05 MED ADMIN — traMADol (ULTRAM) tablet 50 mg: 50 mg | ORAL | @ 09:00:00 | Stop: 2022-06-05

## 2022-06-05 MED ADMIN — predniSONE (DELTASONE) tablet 5 mg: 5 mg | ORAL | @ 14:00:00 | Stop: 2022-06-05

## 2022-06-05 MED ADMIN — acetaminophen (TYLENOL) tablet 1,000 mg: 1000 mg | ORAL | @ 01:00:00

## 2022-06-05 MED ADMIN — remdesivir (VEKLURY) 100 mg in sodium chloride (NS) 0.9 % 295 mL IVPB: 100 mg | INTRAVENOUS | @ 14:00:00 | Stop: 2022-06-05

## 2022-06-05 MED ADMIN — acetaminophen (TYLENOL) tablet 1,000 mg: 1000 mg | ORAL | @ 14:00:00 | Stop: 2022-06-05

## 2022-06-05 MED ADMIN — pantoprazole (Protonix) EC tablet 20 mg: 20 mg | ORAL | @ 14:00:00 | Stop: 2022-06-05

## 2022-06-05 MED ADMIN — ondansetron (ZOFRAN-ODT) disintegrating tablet 4 mg: 4 mg | ORAL | @ 09:00:00 | Stop: 2022-06-05

## 2022-06-05 NOTE — Unmapped (Signed)
Physician Discharge Summary [facility]  3 [facility]  [address]  [place] [address]  Dept: [phone]  Loc: [phone]     Identifying Information:   Ryan Moon  1985-11-09  [id number]    Primary Care Physician: Harlow Mares, MD     Code Status: Full Code    Admit Date: 06/03/2022    Discharge Date: 06/05/2022     Discharge To: Home    Discharge Service: [facility] - Nephrology Floor Team (MED B Alvester Morin)     Discharge Attending Physician: No att. providers found    Discharge Diagnoses:     Principal Problem:    AKI (acute kidney injury) (CMS-HCC) (POA: Yes)  Active Problems:    Kidney transplant recipient (POA: Not Applicable)    Immunosuppression (CMS-HCC) (POA: Yes)    Crescentic glomerulonephritis (POA: Yes)    Essential hypertension (POA: Yes)    Renal vasculitis (CMS-HCC) (POA: Yes)    Microscopic hematuria (POA: Yes)    Hemorrhoids (POA: Yes)    Hematuria (POA: Yes)    Hematochezia (POA: Yes)    COVID-19 (POA: Yes)  Resolved Problems:    * No resolved hospital problems. *      Hospital Course:   Ryan Moon is a 37 y.o. 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 He was treated for COVID19 with Remdesivir and will follow up outpatient to continue workup for kidney rejection with a biopsy after resolution of COVID infection. Please find below a summary of the problems addressed during this admission.     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 He noted he was having hematuria 12/25-12/27. Cr 2.11 on admission, baseline had been 1.8 prior to outpt visit 12/27. Increase in proteinuria and hematuria noted on 12/27 which may be due to rejection vs recurrence of vasculitis in the transplant kidney. Workup for rejection was begun with studies including ANCA, Urinalysis and will follow up outpatient for a renal biopsy. He is scheduled for IV Belantacept 250mg  monthly (next due 06/13/21). Prednisone 5mg  daily was continued. Antimetabolics not given iso leukopenia.      COVID-19   Pt has had general malaise for 5 days, tested positive for COVID19 at an urgent care 12/28 and was started on Molnupiravir (Lagevrio) 800mg  BID. He had a fever at home reported to be 102F, denied any respiratory symptoms and lungs are clear to auscultation bilat on admission. CXR unremarkable, no hypoxemia. He received 3 days of remdesivir, and will complete his course of with molnupiravir for 3 more days outpatient after discharge.      Hematochezia due to Hemorrhoidal Bleeding  Pt reported episodes of BRBPR twice last night which filled toilet bowl. He denied pain with defecation. Admitted 07/05/21 for BRBPR, colonoscopy 07/07/21 showed nonbleeding external and internal hemorrhoids, considered likely etiology of rectal bleeding. It was also noted that he had 4 sessile polyps removed and insufficient prep to remove smaller polyps. Hgb stable on admission at 12.3 (11.9 at outpatient visit 12/27). BRBPR likely recurrence of hemorrhoidal bleeding and did not recur during admission. No further workup was required.      Chronic Problems  Hx panic attacks, anxiety: Continued home xanax 0.5mg  BID PRN  GERD: Held home omeprazole, started on equivalent Pantoprazole 20mg  daily  HTN: Holding home meds iso normotension, will CTM. Restart  Amlodipine 5mg  nightly at discharge. Holding losartan 50mg  BID due to AKI and consider restarting outpatient.   The patient's hospital stay has been complicated by the following clinically significant conditions requiring additional evaluation and treatment or having a significant effect of this patient's care: - Chronic kidney disease POA requiring further investigation, treatment, or monitoring   Outpatient Provider Follow Up Issues:   [ ]  Consider re-starting Losartan once AKI improves  [ ]  Consider out patient biopsy to evaluate transplant rejection (Patient prefers to recover from COVID19 before biopsy)    Touchbase with Outpatient Provider:  Warm Handoff: Completed on 06/05/22 by Rande Lawman, MD  (Intern) via Face to Face handoff    Procedures:  None  ______________________________________________________________________  Discharge Medications:      Your Medication List        STOP taking these medications      losartan 50 MG tablet  Commonly known as: COZAAR            CONTINUE taking these medications      ALPRAZolam 0.5 MG tablet  Commonly known as: XANAX  Take 1 tablet (0.5 mg total) by mouth two (2) times a day as needed for anxiety.     amlodipine 5 MG tablet  Commonly known as: NORVASC  Take 1 tablet (5 mg total) by mouth every evening.     belatacept 250 mg Solr  Commonly known as: NULOJIX  Belatacept  5 mg/kg every 2 weeks for 5 doses then once every 4 weeks     magnesium (amino acid chelate) 133 mg  Generic drug: magnesium oxide-Mg AA chelate  Take 1 tablet by mouth daily.     molnupiravir (EUA) 200 mg capsule  Commonly known as: LAGEVRIO  Take 4 capsules (800 mg total) by mouth every twelve (12) hours.     omeprazole 20 MG capsule  Commonly known as: PriLOSEC  Take 1 capsule (20 mg total) by mouth daily.     ondansetron 4 MG disintegrating tablet  Commonly known as: ZOFRAN-ODT  Take 1 tablet (4 mg total) by mouth every eight (8) hours as needed.     predniSONE 5 MG tablet  Commonly known as: DELTASONE  Take 1 tablet (5 mg total) by mouth daily.              Allergies:  Ibuprofen and Acetaminophen  ______________________________________________________________________  Pending Test Results:  Pending Labs       Order Current Status    Anti-neutrophilic Cytoplasmic Antibody (ANCA) In process    Blood Culture Preliminary result    Blood Culture Preliminary result            Most Recent Labs:  All lab results last 24 hours -   Recent Results (from the past 24 hour(s))   Basic metabolic panel    Collection Time: 06/05/22  9:33 AM   Result Value Ref Range    Sodium 135 135 - 145 mmol/L    Potassium 3.8 3.4 - 4.8 mmol/L    Chloride 104 98 - 107 mmol/L    CO2 23.0 20.0 - 31.0 mmol/L    Anion Gap 8 5 - 14 mmol/L    BUN 18 9 - 23 mg/dL    Creatinine 7.82 (H) 0.73 - 1.18 mg/dL    BUN/Creatinine Ratio 9     eGFR CKD-EPI (2021) Male 42 (L) >=60 mL/min/1.41m2    Glucose 78 70 - 179 mg/dL    Calcium 8.3 (L) 8.7 - 10.4 mg/dL  CBC    Collection Time: 06/05/22  9:33 AM   Result Value Ref Range    WBC 2.0 (L) 3.6 - 11.2 10*9/L    RBC 3.26 (L) 4.26 - 5.60 10*12/L    HGB 10.5 (L) 12.9 - 16.5 g/dL    HCT 16.1 (L) 09.6 - 48.0 %    MCV 90.6 77.6 - 95.7 fL    MCH 32.4 25.9 - 32.4 pg    MCHC 35.7 32.0 - 36.0 g/dL    RDW 04.5 40.9 - 81.1 %    MPV 6.9 6.8 - 10.7 fL    Platelet 179 150 - 450 10*9/L   Magnesium Level    Collection Time: 06/05/22  9:33 AM   Result Value Ref Range    Magnesium 1.9 1.6 - 2.6 mg/dL   Phosphorus Level    Collection Time: 06/05/22  9:33 AM   Result Value Ref Range    Phosphorus 3.2 2.4 - 5.1 mg/dL       Relevant Studies/Radiology:  ECG 12 Lead    Result Date: 06/03/2022  BASELINE WANDER NORMAL SINUS RHYTHM NORMAL ECG WHEN COMPARED WITH ECG OF 26-May-2019 10:08, QUESTIONABLE CHANGE IN QRS AXIS T WAVE AMPLITUDE HAS DECREASED IN ANTEROLATERAL LEADS Confirmed by Cheryll Dessert ([id number]) on 06/03/2022 1:26:42 PM    XR Chest 2 views    Result Date: 06/03/2022  EXAM: XR CHEST 2 VIEWS ACCESSION: [id number] UN CLINICAL INDICATION: FEVER  TECHNIQUE: PA and Lateral Chest Radiographs. COMPARISON: Chest radiograph 05/31/2019, 06/07/2021 FINDINGS: Lungs are clear.  No pleural effusion or pneumothorax. Normal heart size and mediastinal contours.     Normal chest.    US Renal Transplant W Doppler    Result Date: 06/03/2022  EXAM: US RENAL Glenna Durand ACCESSION: [id number] UN CLINICAL INDICATION: 37 years old with hematuria and body aches COMPARISON: Renal transplant ultrasound 05/30/2022 TECHNIQUE:  Ultrasound views of the renal transplant were obtained using gray scale and color and spectral Doppler imaging. Views of the urinary bladder were obtained using gray scale and limited color Doppler imaging. FINDINGS: TRANSPLANTED KIDNEY: The renal transplant was located in the right lower quadrant. Normal size and echogenicity.  No solid masses or calculi. No perinephric collections identified. No hydronephrosis. VESSELS: - Perfusion: Using power Doppler, normal perfusion was seen throughout the renal parenchyma. - Resistive indices in the renal transplant are stable compared with prior examination. - Main renal artery/iliac artery: Patent. Elevated peak systolic velocities at the main renal artery anastomosis measuring up to 2.9 m/s, similar to prior. - Main renal vein/iliac vein: Patent BLADDER: Echogenic, layering debris within the bladder.     No significant change compared with prior study. Stable resistive indices in the renal transplant arteries, within normal limits. Echogenic, layering debris within the bladder, which may represent hemorrhage. Differential includes includes infection versus chronic urinary stasis. Please see below for data measurements: Transplant location: RLQ Renal Transplant: Sagittal  11.7   cm; AP  5.7   cm; Transverse  5.6   cm Segmental artery superior resistive index: 0.61 Segmental artery mid resistive index: 0.70 Segmental artery inferior resistive index: 0.64 Previous resistive indices range of segmental arteries:  0.67-0.74  Main renal artery peak systolic velocity at anastomosis: 2.93 m/s Main renal artery hilum resistive index: 0.73 Main renal artery mid resistive index: 0.70 Main renal artery anastomosis resistive index: 0.70 Previous resistive indices range of main renal artery:  0.70-0.78  Main renal vein: patent Iliac artery: Patent Iliac vein: Patent Bladder volume prevoid:  219.5  mL    ______________________________________________________________________  Discharge Instructions:   Activity Instructions       Activity as tolerated                       Follow Up instructions and Outpatient Referrals     Call MD for:  difficulty breathing, headache or visual disturbances      Call MD for:  persistent nausea or vomiting      Call MD for:  severe uncontrolled pain      Call MD for:  temperature >38.5 Celsius      Discharge instructions          Appointments which have been scheduled for you      Jun 13, 2022  9:30 AM  INFUSION LVL 2 HRS with CHAIR 03 [place] TP INF [facility], RN 02 [facility] TP INF [facility]  [facility] TRANSPLANT INFUSION [facility] REGION) [address]  [place] [address]  [phone]        Jul 06, 2022  8:30 AM  (Arrive by 8:15 AM)  CYSTO LOCAL with Vickii Penna, MD  [facility] UROLOGY PROCEDURES [facility] REGION) [address]  [place] [address]  [phone]        Jul 11, 2022  9:30 AM  INFUSION LVL 2 HRS with CHAIR 04 [facility] TP INF [facility], RN 02 [facility] TP INF [facility]  [facility] TRANSPLANT INFUSION [facility] REGION) [address]  [place] [address]  [phone]        Aug 08, 2022  9:30 AM  INFUSION LVL 2 HRS with CHAIR 04 [facility] TP INF [facility], RN 02 [facility] TP INF [facility]  [facility] TRANSPLANT INFUSION [facility] REGION) [address]  [place] [address]  [phone]        Sep 12, 2022 10:00 AM  (Arrive by 9:45 AM)  RETURN NEPHROLOGY POST with Griffin Dakin, MD  [facility] KIDNEY TRANSPLANT [place] [facility] REGION) [address]  [facility] 1 through 4  [place] [address]  [phone]             ______________________________________________________________________  Discharge Day Services:  BP 140/81  - Pulse 76  - Temp 36.4 ??C (97.5 ??F) (Oral)  - Resp 16  - Ht 179.5 cm (5' 10.67)  - Wt 89.1 kg (196 lb 6.9 oz)  - SpO2 93%  - BMI 27.65 kg/m??     Pt seen on the day of discharge and determined appropriate for discharge.    Condition at Discharge: stable    Length of Discharge: I spent greater than 30 mins in the discharge of this patient.

## 2022-06-05 NOTE — Unmapped (Signed)
Pt remained free from falls and injury this shift. VSS, on RA. OOB independently. Spec air/contact precautions in place. Scheduled tylenol given for c/o headache. Received Prn zofran prior to tylenol admin due to reporting nausea w/ tylenol. Bed in low locked position, call bell in reach.   Problem: Adult Inpatient Plan of Care  Goal: Plan of Care Review  Outcome: Progressing  Goal: Patient-Specific Goal (Individualized)  Outcome: Progressing  Goal: Absence of Hospital-Acquired Illness or Injury  Outcome: Progressing  Intervention: Identify and Manage Fall Risk  Recent Flowsheet Documentation  Taken 06/04/2022 1927 by Candie Chroman, RN  Safety Interventions:   low bed   lighting adjusted for tasks/safety   fall reduction program maintained  Intervention: Prevent Skin Injury  Recent Flowsheet Documentation  Taken 06/04/2022 1927 by Candie Chroman, RN  Positioning for Skin: Supine/Back  Device Skin Pressure Protection: absorbent pad utilized/changed  Skin Protection: adhesive use limited  Intervention: Prevent and Manage VTE (Venous Thromboembolism) Risk  Recent Flowsheet Documentation  Taken 06/04/2022 2003 by Candie Chroman, RN  VTE Prevention/Management: anticoagulant therapy  Anti-Embolism Intervention: Refused  Intervention: Prevent Infection  Recent Flowsheet Documentation  Taken 06/04/2022 1927 by Candie Chroman, RN  Infection Prevention: hand hygiene promoted  Goal: Optimal Comfort and Wellbeing  Outcome: Progressing  Goal: Readiness for Transition of Care  Outcome: Progressing  Goal: Rounds/Family Conference  Outcome: Progressing     Problem: Infection  Goal: Absence of Infection Signs and Symptoms  Outcome: Progressing  Intervention: Prevent or Manage Infection  Recent Flowsheet Documentation  Taken 06/04/2022 1927 by Candie Chroman, RN  Infection Management: aseptic technique maintained     Problem: Wound  Goal: Optimal Coping  Outcome: Progressing  Goal: Optimal Functional Ability  Outcome: Progressing  Goal: Absence of Infection Signs and Symptoms  Outcome: Progressing  Intervention: Prevent or Manage Infection  Recent Flowsheet Documentation  Taken 06/04/2022 1927 by Candie Chroman, RN  Infection Management: aseptic technique maintained  Goal: Improved Oral Intake  Outcome: Progressing  Goal: Optimal Pain Control and Function  Outcome: Progressing  Goal: Skin Health and Integrity  Outcome: Progressing  Intervention: Optimize Skin Protection  Recent Flowsheet Documentation  Taken 06/04/2022 1927 by Candie Chroman, RN  Pressure Reduction Techniques: frequent weight shift encouraged  Pressure Reduction Devices: positioning supports utilized  Skin Protection: adhesive use limited  Goal: Optimal Wound Healing  Outcome: Progressing

## 2022-06-08 LAB — ANTI-NEUTROPHILIC CYTOPLASMIC ANTIBODY
ANCA IFA: POSITIVE — AB
MPO-ELISA: POSITIVE — AB
MPO-QUANT: 39.9 U/mL — ABNORMAL HIGH (ref <21.0)
PR3 ELISA: NEGATIVE
PR3-QUANT: 1.3 U/mL (ref <21.0)

## 2022-06-08 NOTE — Unmapped (Signed)
[facility] transplant nephrology clinic visit    assessment and plan  1. 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 baseline creatinine 1.5-1.8 mg/dl. stage iiia chr kidney disease. hematuria and incr proteinuria concerning for recurrent gn; hold aspirin for potential kidney biopsy. anca titer. no donor specific hla ab detected '23.  2. immunosuppression. belatacept 5mg /kg q.month. prednisone 5mg  daily. mycophenolate on hold d/t leukopenia.  3. hypertension. blood pressure goal <130/80 mmhg.  4. hematuria. [facility] u[facility] referral for cystoscopy evaluation d/t cyclophosphamide exposure '19.  5. preventive medicine. influenza '21. pcv13 pnuemococcal '19. ppsv23 pneumococcal '19. covid-19 vaccine refused/recommended.    history of present illness    [name] is a 37 year old gentleman seen in follow up post kidney transplant 04/28/2019.     past medical hx:  1. 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 baseline creatinine 1.5-1.8 mg/dl.   > kidney bx 01/21: no acute/chr rejection.  2. hypertension  3. anxiety/depression    allergies: nkda    medications: belatacept 5mg /kg q.month, prednisone 5mg  daily, losartan 50mg  bid, amlodipine 5mg  pm, aspirin 81mg  daily, mg oxide 133mg  daily, omeprazole 20mg  daily, escitalopram 10mg  daily, buspirone 15mg  bid, alprazolam 0.5mg  prn.    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 chr marijuana    physical exam: t98 p72 bp123/83 wt81.6kg bmi 24.4. wd/wn gentleman appropriate affect and mood. sclera anicteric. mmm no thrush. neck supple no palpable ln. heart rrr nl s1s2 no m/r/g. lungs clear bilateral. abd soft nt/nd. no lower ext edema. msk no synovitis/tophi. skin no rash. neuro alert oriented non focal exam.

## 2022-06-13 ENCOUNTER — Ambulatory Visit: Admit: 2022-06-13 | Discharge: 2022-06-14 | Payer: PRIVATE HEALTH INSURANCE

## 2022-06-13 LAB — URINALYSIS WITH MICROSCOPY WITH CULTURE REFLEX
BILIRUBIN UA: NEGATIVE
GLUCOSE UA: NEGATIVE
KETONES UA: NEGATIVE
LEUKOCYTE ESTERASE UA: NEGATIVE
NITRITE UA: NEGATIVE
PH UA: 6 (ref 5.0–9.0)
PROTEIN UA: 100 — AB
RBC UA: 89 /HPF — ABNORMAL HIGH (ref <=3)
SPECIFIC GRAVITY UA: 1.01 (ref 1.003–1.030)
SQUAMOUS EPITHELIAL: <1 /HPF (ref 0–5)
UROBILINOGEN UA: <2
WBC UA: 8 /HPF — ABNORMAL HIGH (ref <=2)

## 2022-06-13 LAB — MAGNESIUM: MAGNESIUM: 2.1 mg/dL (ref 1.6–2.6)

## 2022-06-13 LAB — RENAL FUNCTION PANEL
ALBUMIN: 3.7 g/dL (ref 3.4–5.0)
ANION GAP: 8 mmol/L (ref 5–14)
BLOOD UREA NITROGEN: 26 mg/dL — ABNORMAL HIGH (ref 9–23)
BUN / CREAT RATIO: 11
CALCIUM: 8.7 mg/dL (ref 8.7–10.4)
CHLORIDE: 104 mmol/L (ref 98–107)
CO2: 26 mmol/L (ref 20.0–31.0)
CREATININE: 2.34 mg/dL — ABNORMAL HIGH
EGFR CKD-EPI (2021) MALE: 36 mL/min/{1.73_m2} — ABNORMAL LOW (ref >=60)
GLUCOSE RANDOM: 84 mg/dL (ref 70–179)
PHOSPHORUS: 3.8 mg/dL (ref 2.4–5.1)
POTASSIUM: 3.6 mmol/L (ref 3.4–4.8)
SODIUM: 138 mmol/L (ref 135–145)

## 2022-06-13 LAB — CBC W/ AUTO DIFF
BASOPHILS ABSOLUTE COUNT: 0 10*9/L (ref 0.0–0.1)
BASOPHILS RELATIVE PERCENT: 1.1 %
EOSINOPHILS ABSOLUTE COUNT: 0.1 10*9/L (ref 0.0–0.5)
EOSINOPHILS RELATIVE PERCENT: 4.2 %
HEMATOCRIT: 29 % — ABNORMAL LOW (ref 39.0–48.0)
HEMOGLOBIN: 10.2 g/dL — ABNORMAL LOW (ref 12.9–16.5)
LYMPHOCYTES ABSOLUTE COUNT: 1.2 10*9/L (ref 1.1–3.6)
LYMPHOCYTES RELATIVE PERCENT: 42.2 %
MEAN CORPUSCULAR HEMOGLOBIN CONC: 35.2 g/dL (ref 32.0–36.0)
MEAN CORPUSCULAR HEMOGLOBIN: 31.8 pg (ref 25.9–32.4)
MEAN CORPUSCULAR VOLUME: 90.4 fL (ref 77.6–95.7)
MEAN PLATELET VOLUME: 6.5 fL — ABNORMAL LOW (ref 6.8–10.7)
MONOCYTES ABSOLUTE COUNT: 0.6 10*9/L (ref 0.3–0.8)
MONOCYTES RELATIVE PERCENT: 22.4 %
NEUTROPHILS ABSOLUTE COUNT: 0.9 10*9/L — ABNORMAL LOW (ref 1.8–7.8)
NEUTROPHILS RELATIVE PERCENT: 30.1 %
PLATELET COUNT: 338 10*9/L (ref 150–450)
RED BLOOD CELL COUNT: 3.21 10*12/L — ABNORMAL LOW (ref 4.26–5.60)
RED CELL DISTRIBUTION WIDTH: 13.4 % (ref 12.2–15.2)
WBC ADJUSTED: 2.9 10*9/L — ABNORMAL LOW (ref 3.6–11.2)

## 2022-06-13 LAB — SLIDE REVIEW

## 2022-06-13 MED ADMIN — belatacept (NULOJIX) 450 mg in sodium chloride (NS) 0.9 % 100 mL IVPB: 5 mg/kg | INTRAVENOUS | @ 15:00:00 | Stop: 2022-06-13

## 2022-06-13 NOTE — Unmapped (Signed)
1610 Patient arrived to the Transplant Infusion Room today for belatacept 450 mg, Condition: well; Mobility: ambulating; accompanied by self.   See Flowsheet and MAR for all details of visit.   0933 VS stable.  9604 PIV placed and secured with coban; labs collected and sent, urine no orders found.  0946 Infusion initiated. Patient educated to notify nursing staff if they experience urticaria, erythema, nausea, shortness of breath, nausea, metal taste in mouth, excessive oral or postnasal drainage and any other changes in status which could be side effects from initiating this medication.  1018 Infusion complete.   1018 VS stable  1025 Line flushed with NS, PIV removed and secured with coban. Patient left clinic today, Condition: well; Mobility: ambulating; accompanied by self.

## 2022-06-14 LAB — ANTI-NEUTROPHILIC CYTOPLASMIC ANTIBODY
ANCA IFA: POSITIVE — AB
MPO-ELISA: POSITIVE — AB
MPO-QUANT: 35 U/mL — ABNORMAL HIGH (ref <21.0)
PR3 ELISA: NEGATIVE
PR3-QUANT: 1 U/mL (ref <21.0)

## 2022-06-14 NOTE — Unmapped (Signed)
[facility] Refill Coordination Note    Specialty Medication(s) to be Shipped:   Transplant: Prednisone 5mg     Other medication(s) to be shipped:  amlodipine , magnesium , omeprazole      Ryan Moon, DOB: 1986/01/06  Phone: [phone] (home)       All above HIPAA information was verified with patient.     Was a Nurse, learning disability used for this call? No    Completed refill call assessment today to schedule patient's medication shipment from the [facility] [phone]).  All relevant notes have been reviewed.     Specialty medication(s) and dose(s) confirmed: Regimen is correct and unchanged.   Changes to medications: Ryan Moon reports no changes at this time.  Changes to insurance: No  New side effects reported not previously addressed with a pharmacist or physician: None reported  Questions for the pharmacist: No    Confirmed patient received a Conservation officer, historic buildings and a Surveyor, mining with first shipment. The patient will receive a drug information handout for each medication shipped and additional FDA Medication Guides as required.       DISEASE/MEDICATION-SPECIFIC INFORMATION        N/A    SPECIALTY MEDICATION ADHERENCE     Medication Adherence    Patient reported X missed doses in the last month: 0  Specialty Medication: predniSONE 5 MG tablet (DELTASONE)  Patient is on additional specialty medications: No  Patient is on more than two specialty medications: No                                Were doses missed due to medication being on hold? No    prednisone 5 mg: 8 days of medicine on hand       REFERRAL TO PHARMACIST     Referral to the pharmacist: Not needed      [facility]     Shipping address confirmed in Epic.     Delivery Scheduled: Yes, Expected medication delivery date: 06/20/22.     Medication will be delivered via UPS to the prescription address in Epic WAM.    Ernestine Mcmurray   [facility] Specialty Technician

## 2022-06-19 MED FILL — OMEPRAZOLE 20 MG CAPSULE,DELAYED RELEASE: ORAL | 30 days supply | Qty: 30 | Fill #10

## 2022-06-19 MED FILL — MG-PLUS-PROTEIN 133 MG TABLET: ORAL | 90 days supply | Qty: 90 | Fill #2

## 2022-06-19 MED FILL — AMLODIPINE 5 MG TABLET: ORAL | 30 days supply | Qty: 30 | Fill #1

## 2022-06-19 MED FILL — PREDNISONE 5 MG TABLET: ORAL | 30 days supply | Qty: 30 | Fill #1

## 2022-06-22 DIAGNOSIS — Z940 Kidney transplant status: Principal | ICD-10-CM

## 2022-06-22 MED ORDER — LOSARTAN 50 MG TABLET
ORAL_TABLET | Freq: Two times a day (BID) | ORAL | 3 refills | 45 days | Status: CP
Start: 2022-06-22 — End: 2023-06-22

## 2022-06-22 NOTE — Unmapped (Signed)
Spoke with patient regarding Dr Alveda Reasons concern over positive anca titer.  Per Dr Toni Arthurs, patient will need upcoming biopsy.  Patient is agreeable to biopsy and does not feel he needs to discuss this plan as he states he will do what is necessary to protect kidney without delay.  He states he would like to schedule biopsy for 1/23.  He also endorses occasional mild discomfort over transplanted kidney.  Dr Toni Arthurs made aware and patient advised to call if pain worsens or becomes more frequent.    He also states he recently stopped Losartan and has noticed BP is 148/90 with headache to accompany.      Per Dr Toni Arthurs, patient will need to restart Losartan 50 mg BID.  Patient verbalized understanding.

## 2022-06-25 NOTE — Unmapped (Signed)
Called to verify pts renal biopsy, pt asked to arrive at  0700, pt not currently taking any blood thinners, NPO status reinforced, pt will have responsible party accompany to appointment, COVID screening negative, procedure explained and all questions answered.

## 2022-06-26 ENCOUNTER — Ambulatory Visit: Admit: 2022-06-26 | Discharge: 2022-06-27 | Payer: PRIVATE HEALTH INSURANCE

## 2022-06-26 ENCOUNTER — Encounter
Admit: 2022-06-26 | Discharge: 2022-06-27 | Payer: PRIVATE HEALTH INSURANCE | Attending: Nephrology | Primary: Nephrology

## 2022-06-26 LAB — BASIC METABOLIC PANEL
ANION GAP: 6 mmol/L (ref 5–14)
BLOOD UREA NITROGEN: 31 mg/dL — ABNORMAL HIGH (ref 9–23)
BUN / CREAT RATIO: 15
CALCIUM: 9.4 mg/dL (ref 8.7–10.4)
CHLORIDE: 108 mmol/L — ABNORMAL HIGH (ref 98–107)
CO2: 26 mmol/L (ref 20.0–31.0)
CREATININE: 2.08 mg/dL — ABNORMAL HIGH
EGFR CKD-EPI (2021) MALE: 41 mL/min/{1.73_m2} — ABNORMAL LOW (ref >=60)
GLUCOSE RANDOM: 101 mg/dL — ABNORMAL HIGH (ref 70–99)
POTASSIUM: 4.3 mmol/L (ref 3.4–4.8)
SODIUM: 140 mmol/L (ref 135–145)

## 2022-06-26 LAB — MANUAL DIFFERENTIAL
BASOPHILS - ABS (DIFF): 0 10*9/L (ref 0.0–0.1)
BASOPHILS - REL (DIFF): 0 %
EOSINOPHILS - ABS (DIFF): 0.1 10*9/L (ref 0.0–0.5)
EOSINOPHILS - REL (DIFF): 3 %
LYMPHOCYTES - ABS (DIFF): 0.7 10*9/L — ABNORMAL LOW (ref 1.1–3.6)
LYMPHOCYTES - REL (DIFF): 39 %
MONOCYTES - ABS (DIFF): 0.4 10*9/L (ref 0.3–0.8)
MONOCYTES - REL (DIFF): 21 %
NEUTROPHILS - ABS (DIFF): 0.7 10*9/L — ABNORMAL LOW (ref 1.8–7.8)
NEUTROPHILS - REL (DIFF): 37 %

## 2022-06-26 LAB — CBC W/ AUTO DIFF
HEMATOCRIT: 29.7 % — ABNORMAL LOW (ref 39.0–48.0)
HEMOGLOBIN: 10.5 g/dL — ABNORMAL LOW (ref 12.9–16.5)
MEAN CORPUSCULAR HEMOGLOBIN CONC: 35.3 g/dL (ref 32.0–36.0)
MEAN CORPUSCULAR HEMOGLOBIN: 32.1 pg (ref 25.9–32.4)
MEAN CORPUSCULAR VOLUME: 90.9 fL (ref 77.6–95.7)
MEAN PLATELET VOLUME: 7.4 fL (ref 6.8–10.7)
PLATELET COUNT: 225 10*9/L (ref 150–450)
RED BLOOD CELL COUNT: 3.27 10*12/L — ABNORMAL LOW (ref 4.26–5.60)
RED CELL DISTRIBUTION WIDTH: 13.7 % (ref 12.2–15.2)
WBC ADJUSTED: 1.9 10*9/L — ABNORMAL LOW (ref 3.6–11.2)

## 2022-06-26 LAB — CBC
HEMATOCRIT: 30 % — ABNORMAL LOW (ref 39.0–48.0)
HEMOGLOBIN: 10.5 g/dL — ABNORMAL LOW (ref 12.9–16.5)
MEAN CORPUSCULAR HEMOGLOBIN CONC: 35.1 g/dL (ref 32.0–36.0)
MEAN CORPUSCULAR HEMOGLOBIN: 32.1 pg (ref 25.9–32.4)
MEAN CORPUSCULAR VOLUME: 91.6 fL (ref 77.6–95.7)
MEAN PLATELET VOLUME: 7.2 fL (ref 6.8–10.7)
PLATELET COUNT: 228 10*9/L (ref 150–450)
RED BLOOD CELL COUNT: 3.27 10*12/L — ABNORMAL LOW (ref 4.26–5.60)
RED CELL DISTRIBUTION WIDTH: 13.5 % (ref 12.2–15.2)
WBC ADJUSTED: 2 10*9/L — ABNORMAL LOW (ref 3.6–11.2)

## 2022-06-26 LAB — APTT
APTT: 26.9 s (ref 24.8–38.4)
HEPARIN CORRELATION: 0.2

## 2022-06-26 LAB — PROTIME-INR
INR: 0.9
PROTIME: 10.1 s (ref 9.9–12.6)

## 2022-06-26 MED ADMIN — acetaminophen (TYLENOL) tablet 650 mg: 650 mg | ORAL | @ 16:00:00

## 2022-06-26 MED ADMIN — fentaNYL (PF) (SUBLIMAZE) injection: INTRAVENOUS | @ 14:00:00 | Stop: 2022-06-26

## 2022-06-26 MED ADMIN — midazolam (VERSED) injection: INTRAVENOUS | @ 14:00:00 | Stop: 2022-06-26

## 2022-06-26 MED ADMIN — lidocaine (XYLOCAINE) 10 mg/mL (1 %) injection: @ 14:00:00 | Stop: 2022-06-26

## 2022-06-26 MED ADMIN — oxyCODONE (ROXICODONE) 5 MG immediate release tablet: ORAL | @ 18:00:00 | Stop: 2022-06-26

## 2022-06-26 MED ADMIN — fentaNYL (PF) (SUBLIMAZE) injection: INTRAVENOUS | @ 15:00:00 | Stop: 2022-06-26

## 2022-06-26 MED ADMIN — acetaminophen (TYLENOL) 325 MG tablet: ORAL | @ 16:00:00 | Stop: 2022-06-26

## 2022-06-26 MED ADMIN — oxyCODONE (ROXICODONE) immediate release tablet 5 mg: 5 mg | ORAL | @ 18:00:00 | Stop: 2022-06-26

## 2022-06-26 NOTE — Unmapped (Signed)
[facility] Nephrology and Hypertension  Kidney Transplant Biopsy Note    Date of Biopsy Referral: 06/26/22    Referring Transplant Provider: Elwyn Lade      Kidney Transplant Coordinator: Laury Deep    Biopsy Fellow: Barnabas Harries    ---------------------------------------------------------------------------------------------------------------------  PLEASE INDICATED BIOPSY URGENCY: Routine     Patient Name: Ryan Moon  MR: [id number]  Age: 37 y.o.  Sex: Male Race: White Ethnicity: Not Hispanic, Latino/a, or Spanish origin   DOB:1986-03-29    Procedures: Ultrasound Guided Percutaneous Kidney Biopsy under Moderate Sedation  Tissue Submitted: Kidney  Special Studies Required: LM, IF, EM  ----------------------------------------------------------------------------------------------------------------------  Date of allograft implantation: 04/28/2019  ABO Incompatible: No  Underlying native kidney disease: MPO ANCA+ GN  Was the diagnosis established by biopsy? Yes  Previous transplant biopsies: No  If yes, what were the previous diagnoses? NA  Previous kidney transplants: No    Donor Information (if available)  Age of donor: ?  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 primary kidney disease MPO ANCA+ GN treated with iv methylprednisolone, cyclophosphamide and rituximab 10/2017. baseline creatinine 1.5-1.8 mg/dl. +incr creatinine in past month with microscopic hematuria and proteinuria. urine microscopic examination 12/23: no dysmorphic rbcs or rbc casts. MPO ANCA+ titer 39.9. baseline immunosuppression belatacept infusion, prednisone; mycophenolate on hold d/t chronic leukopenia.    ----------------------------------------------------------------------------------------------------------------------  Current Baseline Immunosuppression: prednisone and belatacept (please select ALL drugs used)  Specific anti-rejection treatment WITHIN 2 WEEKS before biopsy: No  If yes, what was the type of treatment? NA  Patient off immunosuppression?: No  Patient seems adherent to immunosuppression? YES  Patient is currently back on dialysis? No  Has patient had any prior episodes of rejection? No   If yes, what was the treatment? NA  ----------------------------------------------------------------------------------------------------------------------  Donor Specific Antibody Results:  Lab Results   Component Value Date    Donor ID [id number] 05/30/2022    Donor HLA-A Antigen #1 A2 05/30/2022    Anti-Donor HLA-A #1 MFI 29 05/30/2022    Donor HLA-A Antigen #2 A29 05/30/2022    Donor HLA-B Antigen #1 B8 05/30/2022    Anti-Donor HLA-B #1 MFI 0 05/30/2022    Donor HLA-B Antigen #2 B44 05/30/2022    Anti-Donor HLA-B #2 MFI 0 05/30/2022    Donor HLA-C Antigen #1 C7 05/30/2022    Anti-Donor HLA-C #1 MFI 0 05/18/2020    Donor HLA-C Antigen #2 C16 05/30/2022    Anti-Donor HLA-C #2 MFI 0 05/30/2022    Donor HLA-DR Antigen #1 DR17 05/30/2022    Anti-Donor HLA-DR #1 MFI 29 05/30/2022    Donor HLA-DR Antigen #2 DR7 05/30/2022    Anti-Donor HLA-DR #2 MFI 28 05/30/2022    Donor DRw Antigen #1 DR52 05/30/2022    Anti-Donor DRw #1 MFI 8 05/30/2022    Donor HLA-DQB Antigen #1 DQ2 05/30/2022    Anti-Donor HLA-DQB #1 MFI 0 05/30/2022    Donor HLA-DQB Antigen #2 DQ2 05/30/2022    Anti-Donor HLA-DQB #2 MFI 21 05/30/2022       Blood Pressure (mmHg):   BP Readings from Last 3 Encounters:   06/26/22 135/89   06/13/22 125/83   06/05/22 140/81     On Anti-hypertensive Therapy: Yes    Urinalysis:  Lab Results   Component Value Date    Color, UA Colorless 06/13/2022    Specific Gravity, UA 1.010 06/13/2022    pH, UA  6.0 06/13/2022    Glucose, UA Negative 06/13/2022    Ketones, UA Negative 06/13/2022    Blood, UA Large (A) 06/13/2022    Nitrite, UA Negative 06/13/2022    Leukocyte Esterase, UA Negative 06/13/2022    Urobilinogen, UA <2.0 mg/dL 16/03/9603    Bilirubin, UA Negative 06/13/2022     Urine protein/creatinine ratio:   Lab Results   Component Value Date    Protein/Creatinine Ratio, Urine 1.611 06/04/2022        Creatinine (present peak): 2.3 Creatinine (baseline): 1.5-1.8  Lab Results   Component Value Date    CREATININE 2.34 (H) 06/13/2022    CREATININE 2.04 (H) 06/05/2022    CREATININE 2.01 (H) 06/04/2022    CREATININE 2.11 (H) 06/03/2022    CREATININE 2.16 (H) 05/30/2022     Glucose:   Lab Results   Component Value Date    Glucose 84 06/13/2022     HGBA1C:   Lab Results   Component Value Date    Hemoglobin A1C 5.3 05/30/2022     ----------------------------------------------------------------------------------------------------------------------  Clinical signs of infections at time of current biopsy:  BK: No   BK blood viral load:   Lab Results   Component Value Date    BK Blood Result Not Detected 05/30/2022        CMV: No   CMV viral load:   Lab Results   Component Value Date    CMV Viral Ld Not Detected 06/04/2022      SARS COV2 PCR+ 06/01/2022  HSV: No  Hepatitis B: No  Hepatitis C: No  Bacteria: No  Fungi: No  Urinary tract infection: No    ----------------------------------------------------------------------------------------------------------------------  Stenosis of renal artery: No  Obstruction of ureter: No  Lymphocele: No

## 2022-06-26 NOTE — Unmapped (Signed)
Post Kidney Biopsy Instructions    You have had a kidney biopsy to help in the diagnosis of your kidney disease.  Your final biopsy results will take three to seven business days to complete. A preliminary report is often available within 72 hours. You will receive your biopsy results from your primary nephrologist. If you have questions or concerns, you can call the [facility] hospital operator at [phone] and ask them to page the nephrology fellow on call.    Please follow the instructions below:  ?? If you received any pain and/or anxiety medications for your biopsy, you should not drive or operate heavy equipment for 24 hrs.  ?? Avoid heavy physical activity for the next 3 weeks.  ?? No signing of legal documents on the day of the procedure.  ?? No drinking of any alcoholic beverage on the day of the procedure.  ?? Do not drive or operate heavy machinery on the day of the procedure.  ?? No heavy lifting greater than 15 pounds or exertion for 3 weeks.    ?? Hold the following medication for the next 7 days unless your doctor has instructed you differently.  ?? NSAIDSs (ibuprofen, Motrin, Aleve)  ?? Apixaban [Eliquis]  ?? Dabigatran [Pradexa]  ?? Rivaroxaban [Xarelto]  ?? Fondaparinux (Arixtra)  ?? Warfarin [Coumadin]  ?? Heparin  ?? Low molecular weight heparin Hosius.Kirks ]  ?? Aspirin  ?? Clopidogrel [Plavix]  ?? Ticagrelor [Brilinta]  ?? Prasugel [Effient]  ?? Dipyridamole/aspirin [Aggrenox]  ?? Cilostazol [Pletal]  ?? Ticlodipine [Ticlid]  ?? Dipyridamole [Persantine]  ?? Keep your dressing clean, dry and in place for 2 days. After 2 days, you may shower. After showering, remove the dressing and wash the area with warm, soapy water. Re-apply a dressing and repeat this process until the biopsy site is healed. Do not take a bath or get into a hot tub or go swimming until site is healed.  ?? If you have blood in your urine that is not decreasing each time you urinate, have difficulty urinating, increasing back pain, or if you feel faint or dizzy, you should call the [facility] hospital operator at [phone] and ask them to page the nephrology fellow on call.    If you have any severe or sudden change in the way you feel, you should go to the nearest ER or call 911.

## 2022-06-26 NOTE — Unmapped (Signed)
KIDNEY BIOPSY PROCEDURE NOTE    INDICATIONS:  Nephrotic syndrome    CONSENT/TIME OUT:    Risks, benefits and alternatives including blood loss requiring transfusion, loss of kidney or kidney function, and death were discussed with patient.  Written informed consent was obtained prior to the procedure and is detailed in the medical record.  Prior to the start of the procedure, a time out was taken and the identity of the patient was confirmed via name, medical record number and date of birth.  The availability of the correct equipment was verified.    PROCEDURE:  Name: Transplant Kidney Biopsy  Description: Ultrasound guided transplant kidney biopsy     Pre-Procedure blood specimens were sent for CBC, PT/aPTT, and type & screen.     The patient was given  2 mg of midazolam and 75 mcg of fentanyl during the procedure, and 15 mL lidocaine 1% were used for local anesthesia. Under ultrasound guidance, a 16cm 16G biopsy needle was inserted into the right kidney for a total of 3 passes with 2 core specimens obtained for analysis.      COMPLICATIONS:  Complications:  Post-procedure ultrasound shows no hematoma.   Complications of the procedure: none     The patient will be monitored closely in the recovery area, kept flat for 4 hours, and will have a repeat CBC and ultrasound in 3 hours to insure no hemodynamically significant bleeding post-biopsy.    SPECIMEN(S):  2 core specimens collected and sent to pathology lab    Case seen and discussed with Dr. Virgina Organ, MD   Nephrology Fellow PGY-4  Division of Nephrology and Hypertension  [facility]

## 2022-06-26 NOTE — Unmapped (Signed)
Assessment/Plan:    Ryan Moon is a 37 y.o. male who will undergo Ultrasound-guided transplant kidney Moon    1. Indications and risks/benefits of procedure reviewed with patient.    2. Consent signed and present on patient's charge.   3. No cardiopulmonary or other medical contraindications present therefore will proceed with Moon.   4.  Surgical pathology has been consulted  5. BUN is 31  today, so the patient will not receive DDAVP    CC: Ryan Moon    HPI: Ryan Moon with moderate sedation. Currently feeling at baseline without recent changes in health. Pt denies  dysuria, urinary urgency, urinary frequency, fevers. Pt not currently taking anticoagulant/antiplatelet agents. Pt NPO since midnight. Pt denies any history of complications related to anesthesia. Pt denies any prior reactions to opioid or benzodiazepines.     Allergies:  Ibuprofen and Acetaminophen    Medications:   Prior to Admission medications    Medication Sig Start Date End Date Taking? Authorizing Provider   ALPRAZolam Prudy Feeler) 0.5 MG tablet Take 1 tablet (0.5 mg total) by mouth two (2) times a day as needed for anxiety. 05/03/22  Yes Erdem, Nurum Marzetta Board, MD   amlodipine (NORVASC) 5 MG tablet Take 1 tablet (5 mg total) by mouth every evening. 05/07/22 05/07/23 Yes Griffin Dakin, MD   losartan (COZAAR) 50 MG tablet Take 1 tablet (50 mg total) by mouth two (2) times a day. 06/22/22 06/22/23 Yes Griffin Dakin, MD   magnesium oxide-Mg AA chelate (MAGNESIUM, AMINO ACID CHELATE,) 133 mg Take 1 tablet by mouth daily. 12/13/21 12/13/22 Yes Griffin Dakin, MD   omeprazole (PRILOSEC) 20 MG capsule Take 1 capsule (20 mg total) by mouth daily. 08/16/21 08/16/22 Yes Griffin Dakin, MD   ondansetron (ZOFRAN-ODT) 4 MG disintegrating tablet Take 1 tablet (4 mg total) by mouth every eight (8) hours as needed. 03/22/22  Yes Erdem, Nurum Marzetta Board, MD   predniSONE (DELTASONE) 5 MG tablet Take 1 tablet (5 mg total) by mouth daily. 05/07/22  Yes Griffin Dakin, MD   belatacept (NULOJIX) 250 mg SolR Belatacept  5 mg/kg every 2 weeks for 5 doses then once every 4 weeks 08/20/19   Chargualaf, Lorel Monaco, CPP   molnupiravir, EUA, (LAGEVRIO) 200 mg capsule Take 4 capsules (800 mg total) by mouth every twelve (12) hours.  Patient not taking: Reported on 06/26/2022    [provider]       Medical History:  Past Medical History:   Diagnosis Date    Renal vasculitis (CMS-HCC)     Secondary hyperparathyroidism (CMS-HCC)        Surgical History:  Past Surgical History:   Procedure Laterality Date    APPENDECTOMY  2013    NEPHRECTOMY TRANSPLANTED ORGAN      PR COLSC FLX W/RMVL OF TUMOR POLYP LESION SNARE TQ N/A 07/07/2021    Procedure: COLONOSCOPY FLEX; W/REMOV TUMOR/LES BY SNARE;  Surgeon: Maris Berger, MD;  [facility]: [facility];  Service: Gastroenterology    PR TRANSPLANT,PREP CADAVER RENAL GRAFT Right 04/28/2019    Procedure: [facility] STD PREP CAD DONR RENAL ALLOGFT PRIOR TO TRNSPLNT, INCL DISSEC/REM PERINEPH FAT, DIAPH/RTPER ATTAC;  Surgeon: Doyce Loose, MD;  Location: MAIN OR [place];  Service: Transplant    PR TRANSPLANTATION OF KIDNEY Right 04/28/2019    Procedure: RENAL ALLOTRANSPLANTATION, IMPLANTATION OF GRAFT; WITHOUT RECIPIENT NEPHRECTOMY;  Surgeon:  Doyce Loose, MD;  Location: MAIN OR [facility];  Service: Transplant       Social History:  Tobacco use:   reports that he quit smoking about 4 years ago. His smoking use included cigarettes. He has quit using smokeless tobacco.  His smokeless tobacco use included chew.  Alcohol use:   reports current alcohol use of about 1.0 standard drink of alcohol per week.  Drug use:  reports current drug use. Drug: Marijuana.    Family History:  Patient denies significant family history.    ROS:  10 systems reviewed and are negative unless otherwise mentioned in HPI    ASA Grade: ASA 2 - Patient with mild systemic disease with no functional limitations    PE:    Vitals:    06/26/22 0715   BP: 135/89   Pulse: 68   Resp: 18   Temp: 36.6 ??C (97.9 ??F)   SpO2: 99%     General:  well appearing male in NAD.  HEENT: conjunctival anicteric  Airway assessment: Class 3 - Can visualize soft palate  Cardiovascular:  RRR nl s1/s2 no s3/s4 no m/r  Lungs: CTAB w/o adventitious sounds, no incr WOB  Extr: No LE edema bilat  Skin: no visible lesions or rashes to bilateral flanks.  Psych: alert, engaged, appropriate mood and affect    Barnabas Harries, MD

## 2022-06-27 NOTE — Unmapped (Signed)
Patient called by this RN re: renal biopsy yesterday.  No answer, left message with call back number for any concerns.

## 2022-06-28 LAB — CBC
HEMATOCRIT: 26.8 % — ABNORMAL LOW (ref 39.0–48.0)
HEMOGLOBIN: 9.6 g/dL — ABNORMAL LOW (ref 12.9–16.5)
MEAN CORPUSCULAR HEMOGLOBIN CONC: 35.8 g/dL (ref 32.0–36.0)
MEAN CORPUSCULAR HEMOGLOBIN: 32.2 pg (ref 25.9–32.4)
MEAN CORPUSCULAR VOLUME: 90.1 fL (ref 77.6–95.7)
MEAN PLATELET VOLUME: 7.5 fL (ref 6.8–10.7)
PLATELET COUNT: 201 10*9/L (ref 150–450)
RED BLOOD CELL COUNT: 2.98 10*12/L — ABNORMAL LOW (ref 4.26–5.60)
RED CELL DISTRIBUTION WIDTH: 13.6 % (ref 12.2–15.2)
WBC ADJUSTED: 2.7 10*9/L — ABNORMAL LOW (ref 3.6–11.2)

## 2022-06-29 ENCOUNTER — Ambulatory Visit
Admit: 2022-06-29 | Discharge: 2022-07-01 | Disposition: A | Discharge status: Home / Self Care | Payer: PRIVATE HEALTH INSURANCE | Admitting: Nephrology

## 2022-06-29 LAB — URINALYSIS WITH MICROSCOPY
BACTERIA: NONE SEEN /HPF
BILIRUBIN UA: NEGATIVE
GLUCOSE UA: NEGATIVE
KETONES UA: NEGATIVE
LEUKOCYTE ESTERASE UA: NEGATIVE
NITRITE UA: NEGATIVE
PH UA: 6 (ref 5.0–9.0)
PROTEIN UA: 100 — AB
RBC UA: 91 /HPF — ABNORMAL HIGH (ref <=3)
SPECIFIC GRAVITY UA: 1.015 (ref 1.003–1.030)
SQUAMOUS EPITHELIAL: <1 /HPF (ref 0–5)
UROBILINOGEN UA: <2
WBC UA: 6 /HPF — ABNORMAL HIGH (ref <=2)

## 2022-06-29 LAB — PROTEIN / CREATININE RATIO, URINE
CREATININE, URINE: 90.4 mg/dL
PROTEIN URINE: 134 mg/dL
PROTEIN/CREAT RATIO, URINE: 1.482

## 2022-06-29 LAB — CBC W/ AUTO DIFF
BASOPHILS ABSOLUTE COUNT: 0 10*9/L (ref 0.0–0.1)
BASOPHILS RELATIVE PERCENT: 0.3 %
EOSINOPHILS ABSOLUTE COUNT: 0 10*9/L (ref 0.0–0.5)
EOSINOPHILS RELATIVE PERCENT: 1 %
HEMATOCRIT: 28.3 % — ABNORMAL LOW (ref 39.0–48.0)
HEMOGLOBIN: 10.2 g/dL — ABNORMAL LOW (ref 12.9–16.5)
LYMPHOCYTES ABSOLUTE COUNT: 0.6 10*9/L — ABNORMAL LOW (ref 1.1–3.6)
LYMPHOCYTES RELATIVE PERCENT: 31.9 %
MEAN CORPUSCULAR HEMOGLOBIN CONC: 35.9 g/dL (ref 32.0–36.0)
MEAN CORPUSCULAR HEMOGLOBIN: 32.4 pg (ref 25.9–32.4)
MEAN CORPUSCULAR VOLUME: 90.1 fL (ref 77.6–95.7)
MEAN PLATELET VOLUME: 7.6 fL (ref 6.8–10.7)
MONOCYTES ABSOLUTE COUNT: 0.1 10*9/L — ABNORMAL LOW (ref 0.3–0.8)
MONOCYTES RELATIVE PERCENT: 5.1 %
NEUTROPHILS ABSOLUTE COUNT: 1.1 10*9/L — ABNORMAL LOW (ref 1.8–7.8)
NEUTROPHILS RELATIVE PERCENT: 61.7 %
PLATELET COUNT: 200 10*9/L (ref 150–450)
RED BLOOD CELL COUNT: 3.14 10*12/L — ABNORMAL LOW (ref 4.26–5.60)
RED CELL DISTRIBUTION WIDTH: 13.4 % (ref 12.2–15.2)
WBC ADJUSTED: 1.8 10*9/L — ABNORMAL LOW (ref 3.6–11.2)

## 2022-06-29 LAB — ADDON DIFFERENTIAL ONLY
BASOPHILS ABSOLUTE COUNT: 0 10*9/L (ref 0.0–0.1)
BASOPHILS RELATIVE PERCENT: 0.7 %
EOSINOPHILS ABSOLUTE COUNT: 0.1 10*9/L (ref 0.0–0.5)
EOSINOPHILS RELATIVE PERCENT: 2.5 %
LYMPHOCYTES ABSOLUTE COUNT: 1.3 10*9/L (ref 1.1–3.6)
LYMPHOCYTES RELATIVE PERCENT: 48.8 %
MONOCYTES ABSOLUTE COUNT: 0.5 10*9/L (ref 0.3–0.8)
MONOCYTES RELATIVE PERCENT: 17.4 %
NEUTROPHILS ABSOLUTE COUNT: 0.8 10*9/L — ABNORMAL LOW (ref 1.8–7.8)
NEUTROPHILS RELATIVE PERCENT: 30.6 %

## 2022-06-29 LAB — BASIC METABOLIC PANEL
ANION GAP: 9 mmol/L (ref 5–14)
ANION GAP: 8 mmol/L (ref 5–14)
BLOOD UREA NITROGEN: 26 mg/dL — ABNORMAL HIGH (ref 9–23)
BLOOD UREA NITROGEN: 32 mg/dL — ABNORMAL HIGH (ref 9–23)
BUN / CREAT RATIO: 13
BUN / CREAT RATIO: 15
CALCIUM: 9.3 mg/dL (ref 8.7–10.4)
CALCIUM: 8.4 mg/dL — ABNORMAL LOW (ref 8.7–10.4)
CHLORIDE: 107 mmol/L (ref 98–107)
CHLORIDE: 107 mmol/L (ref 98–107)
CO2: 25 mmol/L (ref 20.0–31.0)
CO2: 25 mmol/L (ref 20.0–31.0)
CREATININE: 2.04 mg/dL — ABNORMAL HIGH
CREATININE: 2.07 mg/dL — ABNORMAL HIGH
EGFR CKD-EPI (2021) MALE: 42 mL/min/{1.73_m2} — ABNORMAL LOW (ref >=60)
EGFR CKD-EPI (2021) MALE: 41 mL/min/{1.73_m2} — ABNORMAL LOW (ref >=60)
GLUCOSE RANDOM: 63 mg/dL — ABNORMAL LOW (ref 70–179)
GLUCOSE RANDOM: 139 mg/dL (ref 70–179)
POTASSIUM: 3.4 mmol/L (ref 3.4–4.8)
POTASSIUM: 4.2 mmol/L (ref 3.4–4.8)
SODIUM: 141 mmol/L (ref 135–145)
SODIUM: 140 mmol/L (ref 135–145)

## 2022-06-29 LAB — SLIDE REVIEW

## 2022-06-29 LAB — IRON PANEL
IRON SATURATION: 27 % (ref 20–55)
IRON: 47 ug/dL — ABNORMAL LOW
TOTAL IRON BINDING CAPACITY: 177 ug/dL — ABNORMAL LOW (ref 250–425)

## 2022-06-29 LAB — FERRITIN: FERRITIN: 682.3 ng/mL — ABNORMAL HIGH

## 2022-06-29 MED ORDER — ALPRAZOLAM 0.5 MG TABLET
ORAL_TABLET | Freq: Two times a day (BID) | ORAL | 0 refills | 5 days | Status: CP | PRN
Start: 2022-06-29 — End: ?

## 2022-06-29 MED ORDER — ATOVAQUONE 750 MG/5 ML ORAL SUSPENSION
Freq: Every day | ORAL | 0 refills | 30 days
Start: 2022-06-29 — End: 2022-06-29

## 2022-06-29 MED ORDER — TAVNEOS 10 MG CAPSULE
ORAL_CAPSULE | ORAL | 0 refills | 0 days
Start: 2022-06-29 — End: 2022-06-29

## 2022-06-29 MED ADMIN — heparin (porcine) 5,000 unit/mL injection 5,000 Units: 5000 [IU] | SUBCUTANEOUS | @ 06:00:00 | Stop: 2022-06-29

## 2022-06-29 MED ADMIN — acetaminophen (TYLENOL) tablet 650 mg: 650 mg | ORAL | @ 06:00:00

## 2022-06-29 MED ADMIN — amlodipine (NORVASC) tablet 5 mg: 5 mg | ORAL | @ 06:00:00

## 2022-06-29 MED ADMIN — methylPREDNISolone sodium succinate (PF) (SOLU-Medrol) 500 mg in sodium chloride (NS) 0.9 % 50 mL IVPB: 500 mg | INTRAVENOUS | @ 06:00:00 | Stop: 2022-07-02

## 2022-06-29 MED ADMIN — lidocaine 4 % patch 1 patch: 1 | TRANSDERMAL | @ 14:00:00

## 2022-06-29 MED ADMIN — magnesium oxide-Mg AA chelate (Magnesium Plus Protein) 1 tablet: 1 | ORAL | @ 14:00:00

## 2022-06-29 MED ADMIN — heparin (porcine) 5,000 unit/mL injection 5,000 Units: 5000 [IU] | SUBCUTANEOUS | @ 12:00:00 | Stop: 2022-06-29

## 2022-06-29 MED ADMIN — acetaminophen (TYLENOL) tablet 650 mg: 650 mg | ORAL | @ 19:00:00

## 2022-06-29 MED ADMIN — pantoprazole (Protonix) EC tablet 20 mg: 20 mg | ORAL | @ 14:00:00

## 2022-06-29 NOTE — Unmapped (Signed)
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 no acute cellular or ab mediated rejection.    plan: [facility] hospital admission for iv methylprednisolone 500mg  daily x3d and rituximab 1g infusion. anticipate 2nd rituximab 1g infusion in 2 weeks. hold on cyclophosphamide, potential [place] hematology re consult d/t chr leukopenia. pjp prophylaxis x3-92m.

## 2022-06-29 NOTE — Unmapped (Signed)
Patient is requesting the following refill  Requested Prescriptions     Pending Prescriptions Disp Refills    ALPRAZolam (XANAX) 0.5 MG tablet 10 tablet 0     Sig: Take 1 tablet (0.5 mg total) by mouth two (2) times a day as needed for anxiety.       Order pended. Please advise. Thanks    Last OV: 02/14/2022   Next OV: Visit date not found

## 2022-06-29 NOTE — Unmapped (Signed)
Nephrology ([facility]) Progress Note    Assessment & Plan:   Ryan Moon is a 37 y.o. 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     Principal Problem:    ANCA-associated vasculitis (CMS-HCC)  Active Problems:    Immunosuppression (CMS-HCC)    Essential hypertension    Gastroesophageal reflux disease without esophagitis    Renal vasculitis (CMS-HCC)    AKI (acute kidney injury) (CMS-HCC)  Resolved Problems:    * No resolved hospital problems. 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  Baseline Cr 1.5-1.8. Recent admission to [facility] Dec 2023 - Jan 2024 for hematuria. 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   - Methylprednisolone 500 mg IV x3 days (1/25-1/27); anticipate will need Pred taper following solumedrol course  - Cont home PPI equivalent while on high dose steriods  - Anticipate Rituximab 1 g; followed by second dose in 2 weeks   - Start Atovaquone for PJP ppx  - Quant Gold, Hep B serologies, Hep C serologies ordered  - Consider SSI pending BG trend   - Hold home predinisone given solumedrol as above  - Hold Mycophenolate d/t chronic leukopenia  - Belatacept infusion monthly (last dosed 06/13/22)     Chronic Leukopenia  History of chronic leukopenia that continues this admission.  Has seen hematology as an outpatient (May 2023).  At that time workup overall reassuring and chronic leukopenia attributed to immunosuppression from kidney transplant.  This admission continues to have stable leukopenia with mild neutropenia.  Will consult hematology for further management.  -Hold Mycophenolate as above  - Hematology C/s  -CBC with diff daily RLQ Abdominal Pain  Note some right lower quadrant abdominal pain over his transplanted kidney site.  Did have a renal bx 06/26/22  with ultrasound afterwards demonstrating no sign of bleed.  Reassuringly hemoglobin levels have been stable. right lower quadrant pain likely related to inflammation from transplanted  kidney versus postprocedural pain. Less concerned for appendicitis, nephrolithiasis, torsion at this time.   - Repeat renal ultrasound    Anemia  Baseline appears ~12. More recently Hgb close to 10. Prior admission 07/05/21 for BRBPR, colonoscopy 07/07/21 nonbleeding external/internal hemorrhoids, 4 sessile polyps tubular adenoma. Denies blood in stool at this time  -CBC daily   -Iron panel, ferritin        Chronic Problems     Hx panic attacks, anxiety: Continued home xanax 0.5mg  BID PRN  GERD: Held home omeprazole, started on equivalent Pantoprazole 20mg  daily  HTN: Continue Amlodipine 5 mg nightly. Hold Losartan given AKI, BP wnl. Resume as able        Issues Impacting Complexity of Management:  -Intensive monitoring of drug toxicity from Methylprednisolone, Rituximab     Medical Decision Making: Reviewed records from the following unique sources  Epic, labs, biopsy, various specialty notes.      Daily Checklist:  Diet: Regular Diet  DVT PPx: Not Indicated - Padua Score <4  Electrolytes: Replete Potassium to >/= 3.6 and Magnesium to >/= 1.8  Code Status: Full Code  Dispo: Goal Discharge: 06/30/22  following IV steroid course.     Team Contact Information:   Primary Team: Nephrology ([facility])  Primary Resident: Windle Guard, MD, MD      Interval History:   No acute events overnight, patient was resting comfortably in bed and visiting this a.m.  Notes having some pain over his transplanted kidney site where he had the biopsy.  Otherwise does not voice any other complaints.    Denies any fever, shortness of breath, chest pain, nausea/vomiting.    Objective:   Temp:  [36.4 ??C (97.5 ??F)-36.9 ??C (98.4 ??F)] 36.9 ??C (98.4 ??F)  Heart Rate:  [72-92] 92  Resp:  [16-18] 16  BP: (128-141)/(87-91) 141/91  SpO2:  [96 %-100 %] 98 %    Gen: Well appearing, in normal clothes, resting comfortably.   HENT: atraumatic, normocephalic  Heart: RRR  Lungs: CTAB, no crackles or wheezes  Abdomen: soft, tender to palpation in RLQ over transplanted kidney  Extremities: No edema

## 2022-06-29 NOTE — Unmapped (Addendum)
Pt is a direct admit from home. Alert and oriented x4. C/o abd pain. Tylenol given. IV steroids given per order. Pt is independent. VSS on RA. Urine sample sent. Call light and bedside table within reach. Will continue to monitor.    Problem: Adult Inpatient Plan of Care  Goal: Plan of Care Review  Outcome: Progressing  Goal: Patient-Specific Goal (Individualized)  Outcome: Progressing  Goal: Absence of Hospital-Acquired Illness or Injury  Outcome: Progressing  Intervention: Identify and Manage Fall Risk  Recent Flowsheet Documentation  Taken 06/29/2022 0050 by Sherie Don I, RN  Safety Interventions:   fall reduction program maintained   low bed  Intervention: Prevent Skin Injury  Recent Flowsheet Documentation  Taken 06/29/2022 0050 by Sherie Don I, RN  Device Skin Pressure Protection: absorbent pad utilized/changed  Intervention: Prevent and Manage VTE (Venous Thromboembolism) Risk  Recent Flowsheet Documentation  Taken 06/29/2022 0100 by Sherie Don I, RN  VTE Prevention/Management:   ambulation promoted   anticoagulant therapy  Intervention: Prevent Infection  Recent Flowsheet Documentation  Taken 06/29/2022 0050 by Sherie Don I, RN  Infection Prevention:   cohorting utilized   environmental surveillance performed  Goal: Optimal Comfort and Wellbeing  Outcome: Progressing  Goal: Readiness for Transition of Care  Outcome: Progressing  Intervention: Mutually Develop Transition Plan  Recent Flowsheet Documentation  Taken 06/29/2022 0050 by Sherie Don I, RN  Transportation Anticipated: family or friend will provide  Goal: Rounds/Family Conference  Outcome: Progressing     Problem: Wound  Goal: Optimal Coping  Outcome: Progressing  Goal: Optimal Functional Ability  Outcome: Progressing  Goal: Absence of Infection Signs and Symptoms  Outcome: Progressing  Intervention: Prevent or Manage Infection  Recent Flowsheet Documentation  Taken 06/29/2022 0050 by Sherie Don I, RN  Infection Management: aseptic technique maintained  Goal: Improved Oral Intake  Outcome: Progressing  Goal: Optimal Pain Control and Function  Outcome: Progressing  Goal: Skin Health and Integrity  Outcome: Progressing  Intervention: Optimize Skin Protection  Recent Flowsheet Documentation  Taken 06/29/2022 0100 by Sherie Don I, RN  Pressure Reduction Techniques: frequent weight shift encouraged  Pressure Reduction Devices: pressure-redistributing mattress utilized  Taken 06/29/2022 0050 by Sherie Don I, RN  Pressure Reduction Techniques: frequent weight shift encouraged  Head of Bed (HOB) Positioning: HOB elevated  Pressure Reduction Devices: pressure-redistributing mattress utilized  Goal: Optimal Wound Healing  Outcome: Progressing

## 2022-06-29 NOTE — Unmapped (Signed)
Nephrology ([facility]) History & Physical    Assessment & Plan:   Ryan Moon is a 37 y.o. 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      Principal Problem:    ANCA-associated vasculitis (CMS-HCC)  Active Problems:    Immunosuppression (CMS-HCC)    Essential hypertension    Gastroesophageal reflux disease without esophagitis    Renal vasculitis (CMS-HCC)    AKI (acute kidney injury) (CMS-HCC)  Resolved Problems:    * No resolved hospital problems. 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  s/p LD KT 04/28/2019.  Baseline Cr 1.5-1.8, noted to have increase in creatinine over the past month >2.0 with microscopic hematuria and proteinuria.  Urine microscopic exam 12/23 without dysmorphic RBCs or RBC casts.  MPO ANCA positive titer 39.9 on 06/04/22, subsequent check 35.0 on 06/13/2022. 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  - Methylprednisolone 500 mg IV x3 days (1/25-1/27); anticipate will need Pred taper following solumedrol course  - Cont home PPI equivalent while on high dose steriods  - Anticipate Rituximab 1 g; followed by second dose in 2 weeks   - Anticipate PJP ppx for 3-6 months; discuss in AM  - Quant Gold, Hep B serologies, Hep C serologies ordered  - UA/UPC  - Consider SSI pending BG trend   - Hold home predinisone given solumedrol as above  - Hold Mycophenolate d/t chronic leukopenia  - Belatacept infusion monthly (last dosed 06/13/22)    Chronic Leukopenia  -Hold Mycophenolate as above  -Consider Hematology C/s  -CBC with diff daily     Anemia  Baseline appears ~12. More recently Hgb close to 10. Prior admission 07/05/21 for BRBPR, colonoscopy 07/07/21 nonbleeding external/internal hemorrhoids, 4 sessile polyps tubular adenoma. Denies blood in stool at this time  -CBC daily   -Iron panel, ferritin      Chronic Problems    Hx panic attacks, anxiety: Continued home xanax 0.5mg  BID PRN  GERD: Held home omeprazole, started on equivalent Pantoprazole 20mg  daily  HTN: Continue Amlodipine 5 mg nightly. Hold Losartan given AKI, BP wnl. Resume as able       The patient's presentation is complicated by the following clinically significant conditions requiring additional evaluation and treatment: - Hypercoagulable state requiring additional attention to DVT prophylaxis and treatment         Issues Impacting Complexity of Management:  -Intensive monitoring of drug toxicity from Methylprednisolone, Rituximab    Medical Decision Making: Reviewed records from the following unique sources  Epic, labs, biopsy, various specialty notes.      Checklist:  Diet: Regular Diet  DVT PPx: Heparin 5000units q8h  Code Status: Full Code  Dispo: Patient appropriate for  ??Inpatient based on expectation of ongoing need for hospitalization greater than two midnights and severity of presentation/services including IV methylprednisolone, rituximab     Team Contact Information:   Primary Team: Nephrology ([facility])  Primary Resident: Jonette Mate, MD  Resident's Pager: [phone] (Nephrology Senior Resident)  Chief Concern:   ANCA-associated vasculitis (CMS-HCC)      Subjective:   Ryan Moon is a 37 y.o. 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  s/p LD KT 04/28/2019.  Baseline Cr 1.5-1.8, noted to have increase in creatinine over the past month >2.0 with microscopic hematuria and proteinuria.  Urine microscopic exam 12/23 without dysmorphic RBCs or RBC casts.  MPO ANCA positive titer 39.9 on 06/04/22, subsequent check 35.0 on 06/13/2022. Baseline immunosuppression belatacept infusion, prednisone; mycophenolate on hold d/t chronic leukopenia.     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 Mild interstitial fibrosis and tubular atrophy (IFTA 10-20%) with approximately 5% globally sclerotic glomeruli and 15% segmentally sclerotic glomeruli. Mild to moderate arteriosclerosis and mild to moderate arteriolosclerosis with arterionephrosclerosis.     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 Denies fevers, chills, N/V/D, URI symptoms. Reports prior COVID symptoms completely resolved. Does have soreness at site of recent renal biopsy. Denies bruising or increased swelling at the site.       Pertinent Surgical Hx  Past Surgical History:   Procedure Laterality Date    APPENDECTOMY  2013    NEPHRECTOMY TRANSPLANTED ORGAN      PR COLSC FLX W/RMVL OF TUMOR POLYP LESION SNARE TQ N/A 07/07/2021    Procedure: COLONOSCOPY FLEX; W/REMOV TUMOR/LES BY SNARE;  Surgeon: Maris Berger, MD;  Location: [facility];  Service: Gastroenterology    PR TRANSPLANT,PREP CADAVER RENAL GRAFT Right 04/28/2019    Procedure: [facility] STD PREP CAD DONR RENAL ALLOGFT PRIOR TO TRNSPLNT, INCL DISSEC/REM PERINEPH FAT, DIAPH/RTPER ATTAC;  Surgeon: Doyce Loose, MD;  Location: MAIN OR [place];  Service: Transplant    PR TRANSPLANTATION OF KIDNEY Right 04/28/2019    Procedure: RENAL ALLOTRANSPLANTATION, IMPLANTATION OF GRAFT; WITHOUT RECIPIENT NEPHRECTOMY;  Surgeon: Doyce Loose, MD;  Location: MAIN OR [facility];  Service: Transplant         Pertinent Family Hx  Family History   Problem Relation Age of Onset    Kidney disease Neg Hx          Pertinent Social Hx   Social History     Socioeconomic History    Marital status: Married     Spouse name: Racheal    Number of children: 3    Highest education level: High school graduate   Tobacco Use    Smoking status: Former     Current packs/day: 0.00     Types: Cigarettes     Quit date: 05/22/2018     Years since quitting: 4.1    Smokeless tobacco: Former     Types: Catering manager Use: Never used   Substance and Sexual Activity    Alcohol use: Yes     Alcohol/week: 1.0 standard drink of alcohol     Types: 1 Cans of beer per week     Comment: Patient had a DUI at age 67. Until 5/16 patient would have 1-2 beers per day.     Drug use:  Yes     Types: Marijuana     Comment: Patient noted that prior to 2017, patient snorted cocaine once every 2-4 months for several years; patient has denied use since 2107. Patient also reports having smoked  marijuana every couple of months    Sexual activity: Yes     Partners: Female   Social History Narrative    Works in Insurance account manager business.  Married and lives in [place], [place] with wife and 3 children.  Prior substance abuse with DUI age 51.  Until 5:16 PM patient would have 1-2 beers per day however none currently.  Prior to 2017 patient snorted cocaine once every 2 to 4 months for several years and denies use since 2017.  Patient also reports having smoked marijuana daily until August 2019.  Former cigarette use and smokeless tobacco in the form of chewing tobacco quit 05/22/2018.     Social Determinants of Health     Financial Resource Strain: Low Risk  (11/01/2021)    Overall Financial Resource Strain (CARDIA)     Difficulty of Paying Living Expenses: Not hard at all   Food Insecurity: No Food Insecurity (11/01/2021)    Hunger Vital Sign     Worried About Running Out of Food in the Last Year: Never true     Ran Out of Food in the Last Year: Never true   Transportation Needs: No Transportation Needs (11/01/2021)    PRAPARE - Therapist, art (Medical): No     Lack of Transportation (Non-Medical): No         Allergies  Ibuprofen and Acetaminophen    I reviewed the Medication List. The current list is Accurate  Prior to Admission medications    Medication Dose, Route, Frequency   ALPRAZolam (XANAX) 0.5 MG tablet 0.5 mg, Oral, 2 times a day PRN   amlodipine (NORVASC) 5 MG tablet 5 mg, Oral, Every evening   belatacept (NULOJIX) 250 mg SolR Belatacept  5 mg/kg every 2 weeks for 5 doses then once every 4 weeks   losartan (COZAAR) 50 MG tablet 50 mg, Oral, 2 times a day (standard)   magnesium oxide-Mg AA chelate (MAGNESIUM, AMINO ACID CHELATE,) 133 mg Take 1 tablet by mouth daily.   molnupiravir, EUA, (LAGEVRIO) 200 mg capsule 800 mg, Every 12 hours  Patient not taking: Reported on 06/26/2022   omeprazole (PRILOSEC) 20 MG capsule 20 mg, Oral, Daily (standard)   ondansetron (ZOFRAN-ODT) 4 MG disintegrating tablet 4 mg, Oral, Every 8 hours PRN   predniSONE (DELTASONE) 5 MG tablet 5 mg, Oral, Daily (standard)       Librarian, academic:  Ryan Moon currently has decisional capacity for healthcare decision-making and is able to designate a surrogate healthcare decision maker. Ryan Moon designated healthcare decision maker(s) is/are Dexter Wilbourne (the patient's spouse) as denoted by stated patient preference.    Objective:   Physical Exam:  Temp:  [36.8 ??C (98.2 ??F)] 36.8 ??C (98.2 ??F)  Heart Rate:  [72-80] 80  Resp:  [17] 17  BP: (128)/(87-88) 128/88  SpO2:  [100 %] 100 %    Gen: NAD, converses   Eyes: Sclera anicteric, EOMI   HENT: Atraumatic, normocephalic  Neck: Trachea midline  Heart: RRR  Lungs: CTAB, no crackles or wheezes  Abdomen: Soft, mild tenderness to palpation over R groin/RLQ site of renal biopsy. No bruising appreciated or swelling  Extremities: No edema  Neuro: Grossly symmetric, non-focal    Skin:  No  rashes, lesions on clothed exam  Psych: Alert, oriented

## 2022-06-29 NOTE — Unmapped (Cosign Needed)
Hematology Consult Note        Requesting Attending Physician:   Dr. Clelia Croft  Service Requesting Consult: [facility]  Primary Hematologist: Dr. Harlow Moon (saw once 10/03/21)    Reason for Consult: chronic neutropenia    Assessment and Plan:     1.  Neutropenia:    Onset 08/2019, following LDKT in late 04/2019. In total he required 3x doses of G-CSF in 2021. MMF was held in response to neutropenic episodes with some response; has not been restarted since last discontinuation in 02/2021.  Since then, ANCs have been in the 0.5-1.3 range and no further G-CSF has been given.      Ryan Moon as outpatient on 10/03/21, at which time it was felt neutropenia was most likely due to immunosuppressive therapy. Additional workup at that time included copper, HIV, and myeloid mutation panel, all of which were unremarkable, further pointing to medication effect.    Given overall stability in neutropenia without significant changes in other counts since that time, etiology is still most likely medication effect, presumably immunosuppressive therapy. It would be highly unlikely to have primary bone marrow process without abnormality identified on MPN/MDS panel.       2. Normocytic anemia:   Longstanding, present since at least 2019 (earliest labs in our system). Presumed due to combination of CKD and ongoing inflammation with ANCA vasculitis.       Recommendations:  - No contraindications to rituximab for ANCA vasculitis as planned by Nephrology team  - Would not pursue bone marrow biopsy at this time  - Continued observation of neutropenia, G-CSF as indicated per Nephrology   - If neutropenia worsens or other cytopenias develop, would be reasonable to re-evaluate need for bone marrow biopsy      Case discussed with Dr. Isaiah Serge, attestation to follow. Recommendations discussed with the primary team.    Ryan Del, MD, PhD  Hematology Fellow PGY-4    -----------------------------------------     SUBJECTIVE + PERTINENT EVENTS:    Ryan Moon is a 37yo Moon with past medical history of ANCA positive glomerulonephritis (sp LDKT in late 04/2019), HTN, GERD, and chronic neutropenia who is admitted for ANCA vasculitis of transplanted kidney. Hematology is consulted for evaluation of chronic neutropenia.    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 Prior to transplant he was on PD and was treated with rituximab and cyclophosphamide while on PD as well as Epogen.     He first developed neutropenia in early 08/2019.  Tacrolimus dose was decreased and ultimately MMF held.  In total he required 3x doses of G-CSF in 2021. Tacrolimus was changed to belatacept and MMF restarted at lower dose in mid 09/2019, which was quickly increased back to full dose. ANC remained intermittently low, and MMF dose was reduced again in 12/2019.  ANC was relatively stable thereafter, with occasional mild neutropenia, until 11/2020.  ANC fell to nadir of 0.4 in 02/2021, prompting G-CSF x 1.  MMF was also held again and has not been restarted to date. ANC has stably been in 0.5-1.5 range since that time.     He saw Dr. Harlow Moon for neutropenia in clinic on 10/03/21, at which time it was felt likely due to immunosuppressive medication effect. Further workup including copper, HIV, HCV, and myeloid mutation panel were normal.      Currently he is admitted for treatment of ANCA vasculitis of transplant kidney. Receiving methylpred 500mg  x3d (1/25-27), rituximab 1g.  Attempted to visit patient 1/26 PM but he was unavailable (in restroom) and uncertain when he would be available.       MEDICATIONS / ALLERGIES:  Scheduled Meds:   amlodipine  5 mg Oral QPM    atovaquone  1,500 mg Oral Daily    lidocaine  1 patch Transdermal Daily    magnesium oxide-Mg AA chelate  1 tablet Oral Daily    methylPREDNISolone sodium succinate  500 mg Intravenous Daily    pantoprazole  20 mg Oral Daily     Continuous Infusions:  PRN Meds:.acetaminophen, ALPRAZolam, ondansetron **OR** ondansetron    Allergies   Allergen Reactions    Ibuprofen     Acetaminophen Nausea And Vomiting        ROS:  Pertinent positives are listed in HPI, all other systems reviewed are negative.     OBJECTIVE:  Vitals:    06/29/22 1037   BP: 141/91   Pulse: 92   Resp: 16   Temp: 36.9 ??C (98.4 ??F)   SpO2: 98%        Physical exam    Exam deferred as patient unavailable.        LABS / IMAGING:    Results in Past 30 Days  Result Component Current Result Ref Range Previous Result Ref Range   Absolute Lymphocytes 0.6 (L) (06/29/2022) 1.1 - 3.6 10*9/L 1.3 (06/28/2022) 1.1 - 3.6 10*9/L   Absolute Neutrophils 1.1 (L) (06/29/2022) 1.8 - 7.8 10*9/L 0.8 (L) (06/28/2022) 1.8 - 7.8 10*9/L   Alkaline Phosphatase 95 (06/03/2022) 46 - 116 U/L Not in Time Range    ALT 36 (06/03/2022) 10 - 49 U/L Not in Time Range    APTT 26.9 (06/26/2022) 24.8 - 38.4 sec 29.6 (06/03/2022) 24.8 - 38.4 sec   AST 41 (H) (06/03/2022) <=34 U/L Not in Time Range    Creatinine 2.07 (H) (06/29/2022) 0.73 - 1.18 mg/dL 1.61 (H) (0/96/0454) 0.98 - 1.18 mg/dL   HGB 11.9 (L) (1/47/8295) 12.9 - 16.5 g/dL 9.6 (L) (11/22/3084) 57.8 - 16.5 g/dL   MCV 46.9 (12/01/5282) 77.6 - 95.7 fL 90.1 (06/28/2022) 77.6 - 95.7 fL   Platelet 200 (06/29/2022) 150 - 450 10*9/L 201 (06/28/2022) 150 - 450 10*9/L   PT 10.1 (06/26/2022) 9.9 - 12.6 sec 11.0 (06/03/2022) 9.9 - 12.6 sec   RDW 13.4 (06/29/2022) 12.2 - 15.2 % 13.6 (06/28/2022) 12.2 - 15.2 %   Total Bilirubin 0.3 (06/03/2022) 0.3 - 1.2 mg/dL Not in Time Range    WBC 1.8 (L) (06/29/2022) 3.6 - 11.2 10*9/L 2.7 (L) (06/28/2022) 3.6 - 11.2 10*9/L       Reviewed  CT A/P from 07/05/21 - no hepatic or splenomegaly

## 2022-06-30 LAB — HEPATITIS C ANTIBODY: HEPATITIS C ANTIBODY: NONREACTIVE

## 2022-06-30 LAB — CBC W/ AUTO DIFF
BASOPHILS ABSOLUTE COUNT: 0 10*9/L (ref 0.0–0.1)
BASOPHILS RELATIVE PERCENT: 0.1 %
EOSINOPHILS ABSOLUTE COUNT: 0 10*9/L (ref 0.0–0.5)
EOSINOPHILS RELATIVE PERCENT: 0 %
HEMATOCRIT: 30 % — ABNORMAL LOW (ref 39.0–48.0)
HEMOGLOBIN: 10.7 g/dL — ABNORMAL LOW (ref 12.9–16.5)
LYMPHOCYTES ABSOLUTE COUNT: 0.6 10*9/L — ABNORMAL LOW (ref 1.1–3.6)
LYMPHOCYTES RELATIVE PERCENT: 21.5 %
MEAN CORPUSCULAR HEMOGLOBIN CONC: 35.8 g/dL (ref 32.0–36.0)
MEAN CORPUSCULAR HEMOGLOBIN: 32.1 pg (ref 25.9–32.4)
MEAN CORPUSCULAR VOLUME: 89.8 fL (ref 77.6–95.7)
MEAN PLATELET VOLUME: 7.5 fL (ref 6.8–10.7)
MONOCYTES ABSOLUTE COUNT: 0.4 10*9/L (ref 0.3–0.8)
MONOCYTES RELATIVE PERCENT: 12.9 %
NEUTROPHILS ABSOLUTE COUNT: 1.9 10*9/L (ref 1.8–7.8)
NEUTROPHILS RELATIVE PERCENT: 65.5 %
PLATELET COUNT: 241 10*9/L (ref 150–450)
RED BLOOD CELL COUNT: 3.34 10*12/L — ABNORMAL LOW (ref 4.26–5.60)
RED CELL DISTRIBUTION WIDTH: 13.7 % (ref 12.2–15.2)
WBC ADJUSTED: 2.9 10*9/L — ABNORMAL LOW (ref 3.6–11.2)

## 2022-06-30 LAB — SLIDE REVIEW

## 2022-06-30 LAB — BASIC METABOLIC PANEL
ANION GAP: 7 mmol/L (ref 5–14)
BLOOD UREA NITROGEN: 28 mg/dL — ABNORMAL HIGH (ref 9–23)
BUN / CREAT RATIO: 14
CALCIUM: 9.6 mg/dL (ref 8.7–10.4)
CHLORIDE: 108 mmol/L — ABNORMAL HIGH (ref 98–107)
CO2: 25 mmol/L (ref 20.0–31.0)
CREATININE: 1.97 mg/dL — ABNORMAL HIGH
EGFR CKD-EPI (2021) MALE: 44 mL/min/{1.73_m2} — ABNORMAL LOW (ref >=60)
GLUCOSE RANDOM: 125 mg/dL (ref 70–179)
POTASSIUM: 4.3 mmol/L (ref 3.4–4.8)
SODIUM: 140 mmol/L (ref 135–145)

## 2022-06-30 LAB — PHOSPHORUS: PHOSPHORUS: 3.7 mg/dL (ref 2.4–5.1)

## 2022-06-30 LAB — HEPATITIS B SURFACE ANTIBODY
HEPATITIS B SURFACE ANTIBODY QUANT: 93.66 m[IU]/mL — ABNORMAL HIGH (ref <8.00)
HEPATITIS B SURFACE ANTIBODY: REACTIVE — AB

## 2022-06-30 LAB — HEPATITIS B SURFACE ANTIGEN: HEPATITIS B SURFACE ANTIGEN: NONREACTIVE

## 2022-06-30 LAB — MAGNESIUM: MAGNESIUM: 1.8 mg/dL (ref 1.6–2.6)

## 2022-06-30 LAB — HEPATITIS B CORE ANTIBODY, TOTAL: HEPATITIS B CORE TOTAL ANTIBODY: NONREACTIVE

## 2022-06-30 MED ADMIN — diphenhydrAMINE (BENADRYL) injection: 12.5 mg | INTRAVENOUS | @ 04:00:00 | Stop: 2022-06-29

## 2022-06-30 MED ADMIN — methylPREDNISolone sodium succinate (PF) (SOLU-Medrol) 500 mg in sodium chloride (NS) 0.9 % 50 mL IVPB: 500 mg | INTRAVENOUS | @ 14:00:00 | Stop: 2022-07-02

## 2022-06-30 MED ADMIN — riTUXimab-abbs (TRUXIMA) 1,000 mg in sodium chloride (NS) 0.9 % 640 mL IVPB: 1000 mg | INTRAVENOUS | @ 20:00:00 | Stop: 2022-06-30

## 2022-06-30 MED ADMIN — pentamidine (PENTAM) in sterile water nebulizer solution: 300 mg | RESPIRATORY_TRACT | @ 17:00:00 | Stop: 2022-06-30

## 2022-06-30 MED ADMIN — lactated ringers bolus 250 mL: 250 mL | INTRAVENOUS | @ 04:00:00 | Stop: 2022-06-29

## 2022-06-30 MED ADMIN — pantoprazole (Protonix) EC tablet 20 mg: 20 mg | ORAL | @ 14:00:00

## 2022-06-30 MED ADMIN — magnesium oxide-Mg AA chelate (Magnesium Plus Protein) 1 tablet: 1 | ORAL | @ 14:00:00

## 2022-06-30 MED ADMIN — acetaminophen (TYLENOL) tablet 650 mg: 650 mg | ORAL | @ 23:00:00

## 2022-06-30 MED ADMIN — amlodipine (NORVASC) tablet 5 mg: 5 mg | ORAL | @ 23:00:00

## 2022-06-30 MED ADMIN — diphenhydrAMINE (BENADRYL) capsule 50 mg: 50 mg | ORAL | @ 14:00:00 | Stop: 2022-06-30

## 2022-06-30 MED ADMIN — metoclopramide (REGLAN) injection 5 mg: 5 mg | INTRAVENOUS | @ 04:00:00 | Stop: 2022-06-29

## 2022-06-30 MED ADMIN — amlodipine (NORVASC) tablet 5 mg: 5 mg | ORAL | @ 02:00:00

## 2022-06-30 MED ADMIN — butalbital-acetaminophen-caffeine (ESGIC) per tablet 1 tablet: 1 | ORAL | @ 01:00:00 | Stop: 2022-06-29

## 2022-06-30 MED ADMIN — lidocaine 4 % patch 1 patch: 1 | TRANSDERMAL | @ 14:00:00

## 2022-06-30 MED ADMIN — albuterol 2.5 mg /3 mL (0.083 %) nebulizer solution 2.5 mg: 2.5 mg | RESPIRATORY_TRACT | @ 17:00:00 | Stop: 2022-06-30

## 2022-06-30 MED ADMIN — acetaminophen (TYLENOL) tablet 650 mg: 650 mg | ORAL | @ 14:00:00 | Stop: 2022-06-30

## 2022-06-30 MED ADMIN — lactated ringers bolus 250 mL: 250 mL | INTRAVENOUS | @ 05:00:00 | Stop: 2022-06-30

## 2022-06-30 MED ADMIN — magnesium sulfate in D5W 1 gram/100 mL infusion 1 g: 1 g | INTRAVENOUS | @ 04:00:00 | Stop: 2022-06-30

## 2022-06-30 MED ADMIN — lidocaine 4 % patch 2 patch: 2 | TRANSDERMAL | @ 05:00:00

## 2022-06-30 NOTE — Unmapped (Signed)
Pt sleeping at this time, Pt stated with HA the cocktail helped but not much.  Pt wanted steroids last night so that Pt could leave after Rituximab today.  Pt having lower back pain and has 2 lidocaine patches in place.  Pt stated bed too hard and is hurting back.  Pt OOB to bathroom.  No fall or injury this shift.  Bed at lowest level and locked, call bell within reach.  Will continue to monitor.  Problem: Adult Inpatient Plan of Care  Goal: Absence of Hospital-Acquired Illness or Injury  Outcome: Progressing  Intervention: Identify and Manage Fall Risk  Recent Flowsheet Documentation  Taken 06/29/2022 2325 by Erlene Senters, RN  Safety Interventions:   fall reduction program maintained   low bed   nonskid shoes/slippers when out of bed  Intervention: Prevent Skin Injury  Recent Flowsheet Documentation  Taken 06/29/2022 2325 by Erlene Senters, RN  Device Skin Pressure Protection:   absorbent pad utilized/changed   adhesive use limited  Skin Protection:   adhesive use limited   incontinence pads utilized  Intervention: Prevent and Manage VTE (Venous Thromboembolism) Risk  Recent Flowsheet Documentation  Taken 06/29/2022 2325 by Erlene Senters, RN  VTE Prevention/Management: ambulation promoted  Intervention: Prevent Infection  Recent Flowsheet Documentation  Taken 06/29/2022 2325 by Erlene Senters, RN  Infection Prevention:   cohorting utilized   personal protective equipment utilized   equipment surfaces disinfected   hand hygiene promoted   rest/sleep promoted   environmental surveillance performed  Goal: Optimal Comfort and Wellbeing  Outcome: Not Progressing     Problem: Wound  Goal: Optimal Functional Ability  Intervention: Optimize Functional Ability  Recent Flowsheet Documentation  Taken 06/29/2022 2325 by Erlene Senters, RN  Activity Management:   up ad lib   ambulated to bathroom  Goal: Absence of Infection Signs and Symptoms  Outcome: Progressing  Intervention: Prevent or Manage Infection  Recent Flowsheet Documentation  Taken 06/29/2022 2325 by Erlene Senters, RN  Infection Management: aseptic technique maintained  Goal: Optimal Pain Control and Function  Outcome: Progressing  Goal: Skin Health and Integrity  Intervention: Optimize Skin Protection  Recent Flowsheet Documentation  Taken 06/29/2022 2325 by Erlene Senters, RN  Activity Management:   up ad lib   ambulated to bathroom  Pressure Reduction Techniques:   frequent weight shift encouraged   weight shift assistance provided  Head of Bed (HOB) Positioning:   HOB at 20-30 degrees   HOB at 30-45 degrees  Pressure Reduction Devices: pressure-redistributing mattress utilized  Skin Protection:   adhesive use limited   incontinence pads utilized     Problem: Pain Acute  Goal: Optimal Pain Control and Function  Outcome: Progressing

## 2022-06-30 NOTE — Unmapped (Signed)
Patient is been c/o headache, Tylenol given as ordered, not completely relieved, MD notified with new order to give Esgic. Pentaminide was not given by RT, D notified, see additional nurse's notes. He ambulates in the room with no issue, will continue to monitor.    Problem: Adult Inpatient Plan of Care  Goal: Plan of Care Review  Outcome: Ongoing - Unchanged  Goal: Patient-Specific Goal (Individualized)  Outcome: Ongoing - Unchanged  Goal: Absence of Hospital-Acquired Illness or Injury  Outcome: Ongoing - Unchanged  Intervention: Identify and Manage Fall Risk  Recent Flowsheet Documentation  Taken 06/29/2022 0745 by Valentino Nose, RN  Safety Interventions:   fall reduction program maintained   low bed   nonskid shoes/slippers when out of bed  Goal: Optimal Comfort and Wellbeing  Outcome: Ongoing - Unchanged  Goal: Readiness for Transition of Care  Outcome: Ongoing - Unchanged  Goal: Rounds/Family Conference  Outcome: Ongoing - Unchanged

## 2022-06-30 NOTE — Unmapped (Signed)
Nephrology ([facility]) Progress Note    Assessment & Plan:   Ryan Moon is a 37 y.o. 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     Principal Problem:    ANCA-associated vasculitis (CMS-HCC)  Active Problems:    Immunosuppression (CMS-HCC)    Essential hypertension    Gastroesophageal reflux disease without esophagitis    Renal vasculitis (CMS-HCC)    AKI (acute kidney injury) (CMS-HCC)  Resolved Problems:    * No resolved hospital problems. 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  Baseline Cr 1.5-1.8. Recent admission to Med [facility] Dec 2023 - Jan 2024 for hematuria. 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   - Methylprednisolone 500 mg IV x3 days (1/26-1/28); anticipate will need Pred taper following solumedrol course  - Cont home PPI equivalent while on high dose steriods  - Quant Gold pending. Hep B serologies consistent with prior vaccination, Hep B antigen negative. Hep C antibody nonreactive.  - Rituximab infusion today given negative hepatitis serologies.  -Pentamidine for PJP Prophylaxis  - Consider SSI pending BG trend   - Hold home predinisone given solumedrol as above  - Hold Mycophenolate d/t chronic leukopenia  - Belatacept infusion monthly (last dosed 06/13/22)     Chronic Leukopenia  History of chronic leukopenia that continues this admission.  Has seen hematology as an outpatient (May 2023).  At that time workup overall reassuring and chronic leukopenia attributed to immunosuppression from kidney transplant.  This admission continues to have stable leukopenia with mild neutropenia.  Will consult hematology for further management.  - Hematology C/s  -Hold Mycophenolate as above and has not been restarted since 2022. No BMBX per heme. Can do G-CSF if needed.   -CBC with diff daily      RLQ Abdominal Pain  Note some right lower quadrant abdominal pain over his transplanted kidney site.  Did have a renal bx 06/26/22  with ultrasound afterwards demonstrating no sign of bleed.  Reassuringly hemoglobin levels have been stable. right lower quadrant pain likely related to inflammation from transplanted  kidney versus postprocedural pain. Less concerned for appendicitis, nephrolithiasis, torsion at this time.   - Renal ultrasound - no perinephric hematoma/fluid collection or active bleeding at renal biopsy site.     Anemia  Baseline appears ~12. More recently Hgb close to 10. Prior admission 07/05/21 for BRBPR, colonoscopy 07/07/21 nonbleeding external/internal hemorrhoids, 4 sessile polyps tubular adenoma. Denies blood in stool at this time  -CBC daily   -Iron panel, ferritin showed low iron 47, nl iron sat 27%, ferritin 682.3     Chronic Problems     Hx panic attacks, anxiety: Continued home xanax 0.5mg  BID PRN  GERD: Held home omeprazole, started on equivalent Pantoprazole 20mg  daily  HTN: Continue Amlodipine 5 mg nightly. Hold Losartan given AKI, BP wnl. Resume as able        Issues Impacting Complexity of Management:  -Intensive monitoring of drug toxicity from Methylprednisolone, Rituximab     Daily Checklist:  Diet: Regular Diet  DVT PPx: Not Indicated - Padua Score <4  Electrolytes:  Replete Potassium to >/= 3.6 and Magnesium to >/= 1.8  Code Status: Full Code  Dispo: Goal Discharge: 07/01/22  following IV steroid course and Rituximab infusion    Team Contact Information:   Primary Team: Nephrology ([facility])  Primary Resident: Simone Curia, MD, MD      Interval History:   No acute events overnight, patient was resting comfortably in bed. His main concerns this morning are timing of medications so that he can get back to work on Monday. He notes that he started to feel that something was off since he got COVID back in December and we discussed that it is possible that this recurrence of his Vasculitis may have been triggered by COVID 19 infection.     Abdominal pain from prior has resolved.     Denies any fever, shortness of breath, chest pain, nausea/vomiting.    Objective:   Temp:  [36.5 ??C (97.7 ??F)-36.9 ??C (98.4 ??F)] 36.5 ??C (97.7 ??F)  Heart Rate:  [74-92] 76  Resp:  [16-18] 18  BP: (124-141)/(80-91) 124/91  SpO2:  [96 %-98 %] 96 %    Gen: Well appearing, in normal clothes, resting comfortably. NAD.   HENT: atraumatic, normocephalic  Heart: RRR  Lungs: CTAB, no crackles or wheezes anteriorly.   Abdomen: soft, NTND.   Neuro: Awake, Alert and Oriented. Able to move all extremities.

## 2022-07-01 LAB — SLIDE REVIEW

## 2022-07-01 LAB — BASIC METABOLIC PANEL
ANION GAP: 9 mmol/L (ref 5–14)
BLOOD UREA NITROGEN: 29 mg/dL — ABNORMAL HIGH (ref 9–23)
BUN / CREAT RATIO: 15
CALCIUM: 8.8 mg/dL (ref 8.7–10.4)
CHLORIDE: 111 mmol/L — ABNORMAL HIGH (ref 98–107)
CO2: 23 mmol/L (ref 20.0–31.0)
CREATININE: 1.94 mg/dL — ABNORMAL HIGH
EGFR CKD-EPI (2021) MALE: 45 mL/min/{1.73_m2} — ABNORMAL LOW (ref >=60)
GLUCOSE RANDOM: 121 mg/dL (ref 70–179)
POTASSIUM: 3.9 mmol/L (ref 3.4–4.8)
SODIUM: 143 mmol/L (ref 135–145)

## 2022-07-01 LAB — CBC W/ AUTO DIFF
BASOPHILS ABSOLUTE COUNT: 0 10*9/L (ref 0.0–0.1)
BASOPHILS RELATIVE PERCENT: 0.1 %
EOSINOPHILS ABSOLUTE COUNT: 0 10*9/L (ref 0.0–0.5)
EOSINOPHILS RELATIVE PERCENT: 0 %
HEMATOCRIT: 26.2 % — ABNORMAL LOW (ref 39.0–48.0)
HEMOGLOBIN: 9.2 g/dL — ABNORMAL LOW (ref 12.9–16.5)
LYMPHOCYTES ABSOLUTE COUNT: 0.4 10*9/L — ABNORMAL LOW (ref 1.1–3.6)
LYMPHOCYTES RELATIVE PERCENT: 16 %
MEAN CORPUSCULAR HEMOGLOBIN CONC: 35.3 g/dL (ref 32.0–36.0)
MEAN CORPUSCULAR HEMOGLOBIN: 31.9 pg (ref 25.9–32.4)
MEAN CORPUSCULAR VOLUME: 90.5 fL (ref 77.6–95.7)
MEAN PLATELET VOLUME: 7.6 fL (ref 6.8–10.7)
MONOCYTES ABSOLUTE COUNT: 0.5 10*9/L (ref 0.3–0.8)
MONOCYTES RELATIVE PERCENT: 22.2 %
NEUTROPHILS ABSOLUTE COUNT: 1.4 10*9/L — ABNORMAL LOW (ref 1.8–7.8)
NEUTROPHILS RELATIVE PERCENT: 61.7 %
PLATELET COUNT: 197 10*9/L (ref 150–450)
RED BLOOD CELL COUNT: 2.89 10*12/L — ABNORMAL LOW (ref 4.26–5.60)
RED CELL DISTRIBUTION WIDTH: 13.7 % (ref 12.2–15.2)
WBC ADJUSTED: 2.3 10*9/L — ABNORMAL LOW (ref 3.6–11.2)

## 2022-07-01 LAB — MAGNESIUM: MAGNESIUM: 1.8 mg/dL (ref 1.6–2.6)

## 2022-07-01 LAB — PHOSPHORUS: PHOSPHORUS: 3.8 mg/dL (ref 2.4–5.1)

## 2022-07-01 MED ORDER — CALCIUM CARBONATE 500 MG-VITAMIN D3 10 MCG (400 UNIT) TABLET
ORAL_TABLET | Freq: Every day | ORAL | 0 refills | 100 days | Status: CP
Start: 2022-07-01 — End: 2022-07-01

## 2022-07-01 MED ADMIN — magnesium oxide-Mg AA chelate (Magnesium Plus Protein) 1 tablet: 1 | ORAL | @ 14:00:00 | Stop: 2022-07-01

## 2022-07-01 MED ADMIN — methylPREDNISolone sodium succinate (PF) (SOLU-Medrol) 500 mg in sodium chloride (NS) 0.9 % 50 mL IVPB: 500 mg | INTRAVENOUS | @ 14:00:00 | Stop: 2022-07-01

## 2022-07-01 MED ADMIN — lidocaine 4 % patch 1 patch: 1 | TRANSDERMAL | @ 14:00:00 | Stop: 2022-07-01

## 2022-07-01 MED ADMIN — calcium carbonate-cholecalciferol 600 mg of elem-10 mcg (400 unit) COMBO product: ORAL | @ 17:00:00 | Stop: 2022-07-01

## 2022-07-01 MED ADMIN — lidocaine 4 % patch 2 patch: 2 | TRANSDERMAL | @ 05:00:00 | Stop: 2022-07-01

## 2022-07-01 MED ADMIN — pantoprazole (Protonix) EC tablet 20 mg: 20 mg | ORAL | @ 14:00:00 | Stop: 2022-07-01

## 2022-07-01 NOTE — Unmapped (Signed)
Physician Discharge Summary [facility]  3 [facility]  [address]  [place] [address]  Dept: [phone]  Loc: [phone]     Identifying Information:   Ryan Moon  December 21, 1985  [id number]    Primary Care Physician: Harlow Mares, MD     Code Status: Full Code    Admit Date: 06/28/2022    Discharge Date: 07/01/2022     Discharge To: Home    Discharge Service: [facility] - Nephrology Floor Team (MED B Alvester Morin)     Discharge Attending Physician: Dr. Clelia Schaumann    Discharge Diagnoses:   Principal Problem:    ANCA-associated vasculitis (CMS-HCC) (POA: Yes)  Active Problems:    Kidney transplant recipient (POA: Not Applicable)    Immunosuppression (CMS-HCC) (POA: Yes)    Essential hypertension (POA: Yes)    Gastroesophageal reflux disease without esophagitis (POA: Yes)    Leukopenia (POA: Yes)    Renal vasculitis (CMS-HCC) (POA: Yes)    Right lower quadrant pain (POA: Yes)    Microscopic hematuria (POA: Yes)    AKI (acute kidney injury) (CMS-HCC) (POA: Yes)    Proteinuria (POA: Yes)  Resolved Problems:    * No resolved hospital problems. *      Outpatient Provider Follow Up Issues:   [ ]  Patient will need appointment with Dr. Toni Arthurs within 2 weeks  [ ]  Will need to discuss prednisone dosage with Dr. Toni Arthurs  [ ]  Scheduling of next Rituximab infusion  [ ]  Scheduling of next pentamidine administration in 28 days. (Last dose 06/30/22)  [ ]  Losartan held on discharge, will need to follow up with PCP or Nephrology to resume after resolution of AKI.   [ ]  Repeat renal function panel to assess for improvement in kidney function.     Hospital Course:   Ryan Moon is a 37 y.o. 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  Baseline Cr 1.5-1.8. Creatinine on admission elevated to 2.08 and decreasing at time of discharge. Recent admission to Med B Dec 2023 - Jan 2024 for hematuria. 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 Given Methylpred 500mg  IV x3 doses (1/26-1/28). Started on prednisone taper on day after discharge to continue Prednisone 60mg  for 2 weeks and then decrease to 50mg  until follow up with Dr. Toni Arthurs. Will need to discuss other options for immunosuppression since unable to receive Cytoxan in addition to Ritux due to leukopenia. Hep B serologies consistent with prior HepB immunization, but no evidence of active disease. Quant gold pending at time of discharge. Patient started on Pentamidine for PJP prophylaxis and received first dose on 1/27 in the setting of high dose steroids inpatient and probable continuation long term. Patient also discharged with instruction to start calcium and vitamin D supplementation in the setting of possible long term steroid use.     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  Has seen hematology as an outpatient (May 2023).  At that time workup overall reassuring and chronic leukopenia attributed to immunosuppression from kidney transplant.  This admission continues to  have stable leukopenia with mild neutropenia. Hematology was consulted and recommended against BMBx.       RLQ Abdominal Pain  Note some right lower quadrant abdominal pain over his transplanted kidney site.  Did have a renal bx 06/26/22  with ultrasound afterwards demonstrating no sign of bleed.  Reassuringly hemoglobin levels have been stable. Pain self-resolved. Repeat renal ultrasound 1/26 negative for bleeding.     Anemia  Baseline appears ~12. More recently Hgb close to 10. Prior admission 07/05/21 for BRBPR, colonoscopy 07/07/21 nonbleeding external/internal hemorrhoids, 4 sessile polyps tubular adenoma. Denies blood in stool at this time. Iron panel, ferritin showed low iron 47, nl iron sat 27%, ferritin 682.3     Chronic Problems     Hx panic attacks, anxiety: Continued home xanax 0.5mg  BID PRN  GERD: Held home omeprazole, started on equivalent Pantoprazole 20mg  daily Patient counseled on continuing PPI while on high dose steroids.   HTN: Continue Amlodipine 5 mg nightly. Hold Losartan given AKI, BP wnl. Continued to hold Losartan on discharge.         The patient's hospital stay has been complicated by the following clinically significant conditions requiring additional evaluation and treatment or having a significant effect of this patient's care: - Anemia POA requiring further investigation or monitoring  - Chronic kidney disease POA requiring further investigation, treatment, or monitoring     Touchbase with Outpatient Provider:  Warm Handoff: Completed on 07/01/22 by Simone Curia, MD  (Intern) via [facility] Message    Procedures:  N/A  ______________________________________________________________________  Discharge Medications:      Your Medication List        CHANGE how you take these medications      losartan 50 MG tablet  Commonly known as: COZAAR  Take 1 tablet (50 mg total) by mouth two (2) times a day. **HOLD until follow up with MD**  What changed: additional instructions     predniSONE 20 MG tablet  Commonly known as: DELTASONE  Take 3 tablets (60 mg total) by mouth daily for 14 days, THEN 2.5 tablets (50 mg total) daily.  Start taking on: July 02, 2022  What changed:   medication strength  See the new instructions.            CONTINUE taking these medications      ALPRAZolam 0.5 MG tablet  Commonly known as: XANAX  Take 1 tablet (0.5 mg total) by mouth two (2) times a day as needed for anxiety.     amlodipine 5 MG tablet  Commonly known as: NORVASC  Take 1 tablet (5 mg total) by mouth every evening.     belatacept 250 mg Solr  Commonly known as: NULOJIX  Belatacept 5 mg/kg every 2 weeks for 5 doses then once every 4 weeks     magnesium (amino acid chelate) 133 mg  Generic drug: magnesium oxide-Mg AA chelate  Take 1 tablet by mouth daily.     omeprazole 20 MG capsule  Commonly known as: PriLOSEC  Take 1 capsule (20 mg total) by mouth daily.     ondansetron 4 MG disintegrating tablet  Commonly known as: ZOFRAN-ODT  Take 1 tablet (4 mg total) by mouth every eight (8) hours as needed.              Allergies:  Ibuprofen and Acetaminophen  ______________________________________________________________________  Pending Test Results:  Pending Labs       Order Current Status    Quant TB AG1 value In process  Quant TB AG2 value In process    Quant TB Mitogen value In process    Quant TB Nil value In process    Quantiferon TB Gold Plus In process    Quantiferon TB Gold Plus In process            Most Recent Labs:  All lab results last 24 hours -   Recent Results (from the past 24 hour(s))   Magnesium Level    Collection Time: 07/01/22  4:41 AM   Result Value Ref Range    Magnesium 1.8 1.6 - 2.6 mg/dL   Phosphorus Level    Collection Time: 07/01/22  4:41 AM   Result Value Ref Range    Phosphorus 3.8 2.4 - 5.1 mg/dL   CBC w/ Differential    Collection Time: 07/01/22  4:41 AM   Result Value Ref Range    WBC 2.3 (L) 3.6 - 11.2 10*9/L    RBC 2.89 (L) 4.26 - 5.60 10*12/L    HGB 9.2 (L) 12.9 - 16.5 g/dL    HCT 16.1 (L) 09.6 - 48.0 %    MCV 90.5 77.6 - 95.7 fL    MCH 31.9 25.9 - 32.4 pg    MCHC 35.3 32.0 - 36.0 g/dL    RDW 04.5 40.9 - 81.1 %    MPV 7.6 6.8 - 10.7 fL    Platelet 197 150 - 450 10*9/L    Neutrophils % 61.7 %    Lymphocytes % 16.0 %    Monocytes % 22.2 %    Eosinophils % 0.0 %    Basophils % 0.1 %    Absolute Neutrophils 1.4 (L) 1.8 - 7.8 10*9/L    Absolute Lymphocytes 0.4 (L) 1.1 - 3.6 10*9/L    Absolute Monocytes 0.5 0.3 - 0.8 10*9/L    Absolute Eosinophils 0.0 0.0 - 0.5 10*9/L    Absolute Basophils 0.0 0.0 - 0.1 10*9/L   Basic Metabolic Panel    Collection Time: 07/01/22  4:41 AM   Result Value Ref Range    Sodium 143 135 - 145 mmol/L    Potassium 3.9 3.4 - 4.8 mmol/L    Chloride 111 (H) 98 - 107 mmol/L    CO2 23.0 20.0 - 31.0 mmol/L    Anion Gap 9 5 - 14 mmol/L    BUN 29 (H) 9 - 23 mg/dL    Creatinine 9.14 (H) 0.73 - 1.18 mg/dL    BUN/Creatinine Ratio 15     eGFR CKD-EPI (2021) Male 45 (L) >=60 mL/min/1.15m2    Glucose 121 70 - 179 mg/dL    Calcium 8.8 8.7 - 78.2 mg/dL   Morphology Review    Collection Time: 07/01/22  4:41 AM   Result Value Ref Range    Smear Review Comments See Comment (A) Undefined    Dohle Bodies Present (A) Not Present    Hypersegmented Neutrophils Present (A) Not Present       Relevant Studies/Radiology:  US Renal Transplant Limited    Result Date: 06/29/2022  EXAM: US RENAL TRANSPLANT LIMITED ACCESSION: [id number] UN CLINICAL INDICATION: 37 years old with Eval for post biospy bleed  COMPARISON: Renal biopsy ultrasound from 06/26/2021. TECHNIQUE: Ultrasound images of the recently biopsied right lower quadrant transplant kidney and bladder in sagittal and transverse planes were obtained with grayscale and limited color Doppler imaging. FINDINGS: No perinephric hematoma was seen.  There was no  evidence of active bleeding at the biopsy site. No hydronephrosis was seen. The bladder was unremarkable.  No perinephric hematoma/fluid collection or active bleeding at the site of recent right lower quadrant transplant kidney biopsy.    ______________________________________________________________________  Discharge Instructions:   Activity Instructions       Activity as tolerated                           Appointments which have been scheduled for you      Jul 06, 2022  8:30 AM  (Arrive by 8:15 AM)  CYSTO LOCAL with Vickii Penna, MD  [facility] UROLOGY PROCEDURES [facility] REGION) [address]  [place] [address]  [phone]        Jul 11, 2022  9:30 AM  INFUSION LVL 2 HRS with CHAIR 04 [facility] TP INF [facility], RN 02 [facility] TP INF [facility]  [facility] TRANSPLANT INFUSION [place] [facility] REGION) [address]  [place] [address]  [phone]        Aug 08, 2022  9:30 AM  INFUSION LVL 2 HRS with CHAIR 04 [facility] TP INF [facility], RN 02 [facility] TP INF [facility]  [facility] TRANSPLANT INFUSION [place] [facility]) [address]  [place] [address]  [phone]        Sep 12, 2022 10:00 AM  (Arrive by 9:45 AM)  RETURN NEPHROLOGY POST with Griffin Dakin, MD  [facility] KIDNEY TRANSPLANT [place] [facility] REGION) [address]  [facility] 1 through 4  [place] [address]  [phone]             ______________________________________________________________________  Discharge Day Services:  BP 135/93  - Pulse 64  - Temp 36.7 ??C (98.1 ??F) (Oral)  - Resp 19  - SpO2 95%     Pt seen on the day of discharge and determined appropriate for discharge.    Condition at Discharge: good    Length of Discharge: I spent greater than 30 mins in the discharge of this patient.

## 2022-07-01 NOTE — Unmapped (Signed)
Alert and oriented, complaining of HA prn pain medication used to control pain and keep comfortable.  Uneventful Rituxmab infusion, VS monitored through the treatment session.  Complaining of bed being uncomfortable, encouraged OOB to chair for meals and watching TV.  Problem: Adult Inpatient Plan of Care  Goal: Plan of Care Review  Outcome: Ongoing - Unchanged  Goal: Patient-Specific Goal (Individualized)  Outcome: Ongoing - Unchanged  Goal: Absence of Hospital-Acquired Illness or Injury  Outcome: Ongoing - Unchanged  Intervention: Identify and Manage Fall Risk  Recent Flowsheet Documentation  Taken 06/30/2022 0800 by Curlene Labrum, RN  Safety Interventions:   fall reduction program maintained   low bed  Intervention: Prevent Skin Injury  Recent Flowsheet Documentation  Taken 06/30/2022 0800 by Curlene Labrum, RN  Device Skin Pressure Protection:   absorbent pad utilized/changed   adhesive use limited  Skin Protection: adhesive use limited  Intervention: Prevent and Manage VTE (Venous Thromboembolism) Risk  Recent Flowsheet Documentation  Taken 06/30/2022 0800 by Curlene Labrum, RN  VTE Prevention/Management:   ambulation promoted   anticoagulant therapy  Goal: Optimal Comfort and Wellbeing  Outcome: Ongoing - Unchanged  Goal: Readiness for Transition of Care  Outcome: Ongoing - Unchanged  Goal: Rounds/Family Conference  Outcome: Ongoing - Unchanged     Problem: Pain Acute  Goal: Optimal Pain Control and Function  Outcome: Ongoing - Unchanged

## 2022-07-01 NOTE — Unmapped (Addendum)
Ryan Moon is a 37 y.o. 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  Baseline Cr 1.5-1.8. Creatinine on admission elevated to 2.08 and decreasing at time of discharge. Recent admission to Med B Dec 2023 - Jan 2024 for hematuria. 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 Given Methylpred 500mg  IV x3 doses (1/26-1/28). Started on prednisone taper on day after discharge to continue Prednisone 60mg  for 2 weeks and then decrease to 50mg  until follow up with Dr. Toni Arthurs. Will need to discuss other options for immunosuppression since unable to receive Cytoxan in addition to Ritux due to leukopenia. Hep B serologies consistent with prior HepB immunization, but no evidence of active disease. Quant gold pending at time of discharge. Patient started on Pentamidine for PJP prophylaxis and received first dose on 1/27 in the setting of high dose steroids inpatient and probable continuation long term. Patient also discharged with instruction to start calcium and vitamin D supplementation in the setting of possible long term steroid use.     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  Has seen hematology as an outpatient (May 2023).  At that time workup overall reassuring and chronic leukopenia attributed to immunosuppression from kidney transplant.  This admission continues to have stable leukopenia with mild neutropenia. Hematology was consulted and recommended against BMBx.       RLQ Abdominal Pain  Note some right lower quadrant abdominal pain over his transplanted kidney site.  Did have a renal bx 06/26/22  with ultrasound afterwards demonstrating no sign of bleed.  Reassuringly hemoglobin levels have been stable. Pain self-resolved. Repeat renal ultrasound 1/26 negative for bleeding.     Anemia  Baseline appears ~12. More recently Hgb close to 10. Prior admission 07/05/21 for BRBPR, colonoscopy 07/07/21 nonbleeding external/internal hemorrhoids, 4 sessile polyps tubular adenoma. Denies blood in stool at this time. Iron panel, ferritin showed low iron 47, nl iron sat 27%, ferritin 682.3     Chronic Problems     Hx panic attacks, anxiety: Continued home xanax 0.5mg  BID PRN  GERD: Held home omeprazole, started on equivalent Pantoprazole 20mg  daily Patient counseled on continuing PPI while on high dose steroids.   HTN: Continue Amlodipine 5 mg nightly. Hold Losartan given AKI, BP wnl. Continued to hold Losartan on discharge.

## 2022-07-02 MED ORDER — PREDNISONE 20 MG TABLET
ORAL_TABLET | ORAL | 0 refills | 44 days | Status: CP
Start: 2022-07-02 — End: 2022-08-15
  Filled 2022-07-01: qty 117, 44d supply, fill #0

## 2022-07-02 NOTE — Unmapped (Signed)
error 

## 2022-07-03 DIAGNOSIS — Z940 Kidney transplant status: Principal | ICD-10-CM

## 2022-07-03 MED ORDER — GABAPENTIN 100 MG CAPSULE
ORAL_CAPSULE | Freq: Three times a day (TID) | ORAL | 0 refills | 30 days | Status: CP
Start: 2022-07-03 — End: ?

## 2022-07-03 NOTE — Unmapped (Signed)
Received call from patient.  He states he was discharged from the hospital Sunday after receiving 3 doses of Methyprednisone, Rituximab and Pentamidine.  He notes that he felt ok yesterday, but today is experiencing severe bone and joint pain all over.  He has tried Tylenol which he says is not helping.      Per Dr Toni Arthurs, patient to start Gabapentin 200 mg TID as needed for pain.  Patient made aware and prescription sent.  Patient will follow up in about 2 weeks at [facility] for Rituximab and every 4 weeks starting 2/26 week for Pentamidine.

## 2022-07-04 ENCOUNTER — Ambulatory Visit
Admit: 2022-07-04 | Discharge: 2022-07-05 | Payer: PRIVATE HEALTH INSURANCE | Attending: Geriatric Medicine | Primary: Geriatric Medicine

## 2022-07-04 DIAGNOSIS — T8149XA Infection following a procedure, other surgical site, initial encounter: Principal | ICD-10-CM

## 2022-07-04 MED ORDER — TRAMADOL 50 MG TABLET
ORAL_TABLET | Freq: Three times a day (TID) | ORAL | 0 refills | 10 days | Status: CP | PRN
Start: 2022-07-04 — End: 2023-07-04

## 2022-07-04 MED ORDER — AMLODIPINE 5 MG TABLET
ORAL_TABLET | Freq: Two times a day (BID) | ORAL | 3 refills | 90 days | Status: CP
Start: 2022-07-04 — End: 2023-07-04

## 2022-07-04 MED ORDER — MUPIROCIN 2 % TOPICAL OINTMENT
Freq: Two times a day (BID) | TOPICAL | 1 refills | 0 days | Status: CP
Start: 2022-07-04 — End: 2022-07-11

## 2022-07-04 MED ORDER — DOXYCYCLINE HYCLATE 100 MG TABLET
ORAL_TABLET | Freq: Two times a day (BID) | ORAL | 0 refills | 5 days | Status: CP
Start: 2022-07-04 — End: 2022-07-09

## 2022-07-04 NOTE — Unmapped (Incomplete)
Muthukkumar, Trudee Grip, MD  Harlow Mares, MD; Griffin Dakin, MD  Hello,    Mr. 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 While admitted he received IV steroids and Rituximab infusion in addition to pentamidine for PJP prophylaxis. He was discharged on prednisone taper consisting of 60mg  for 2 weeks and then 50mg  until follow up with Dr. Toni Arthurs. He will need to be scheduled for his next Rituximab infusion in 2 weeks and will need follow up for repeat Pentamidine infusion in approximately 4 weeks. He will need close follow up with Transplant Nephrology within 2 weeks. Please let me know if you have any other questions about his hospital stay.    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  Baseline Cr 1.5-1.8. Creatinine on admission elevated to 2.08 and decreasing at time of discharge. Recent admission to [facility] Dec 2023 - Jan 2024 for hematuria. 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 Given Methylpred 500mg  IV x3 doses (1/26-1/28). Started on prednisone taper on day after discharge to continue Prednisone 60mg  for 2 weeks and then decrease to 50mg  until follow up with Dr. Toni Arthurs. Will need to discuss other options for immunosuppression since unable to receive Cytoxan in addition to Ritux due to leukopenia. Hep B serologies consistent with prior HepB immunization, but no evidence of active disease. Quant gold pending at time of discharge. Patient started on Pentamidine for PJP prophylaxis and received first dose on 1/27 in the setting of high dose steroids inpatient and probable continuation long term. Patient also discharged with instruction to start calcium and vitamin D supplementation in the setting of possible long term steroid use.      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  Has seen hematology as an outpatient (May 2023).  At that time workup overall reassuring and chronic leukopenia attributed to immunosuppression from kidney transplant.  This admission continues to have stable leukopenia with mild neutropenia. Hematology was consulted and recommended against BMBx.         RLQ Abdominal Pain  Note some right lower quadrant abdominal pain over his transplanted kidney site.  Did have a renal bx 06/26/22  with ultrasound afterwards demonstrating no sign of bleed.  Reassuringly hemoglobin levels have been stable. Pain self-resolved. Repeat renal ultrasound 1/26 negative for bleeding.      Anemia  Baseline appears ~12. More recently Hgb close to 10. Prior admission 07/05/21 for BRBPR, colonoscopy 07/07/21 nonbleeding external/internal hemorrhoids, 4 sessile polyps tubular adenoma. Denies blood in stool at this time. Iron panel, ferritin showed low iron 47, nl iron sat 27%, ferritin 682.3     Vitamin d and calcium supplement bid     Chronic Problems     Hx panic attacks, anxiety: Continued home xanax 0.5mg  BID PRN  GERD: Held home omeprazole, started on equivalent Pantoprazole 20mg  daily Patient counseled on continuing PPI while on high dose steroids.   HTN: Continue Amlodipine 5 mg nightly. Hold Losartan given AKI, BP wnl. Continued to hold Losartan on discharge.   Patient ID: Ryan Moon is a 37 y.o. male who presents for follow up of status post hospitalization.  Informant: Patient came to appointment alone.  Assessment/Plan:  Abscess after procedure  -     doxycycline hyclate; Take 1 tablet (100 mg total) by mouth two (2) times a day for 5 days.    Other orders  -     traMADoL; Take 1 tablet (50 mg total) by mouth every eight (8) hours as needed for pain.  -     mupirocin; Apply topically two (2) times a day for 7 days.  -     amLODIPine; Take 1 tablet (5 mg total) by mouth two (2) times a day.       Assessment & Plan  1. ANCA vasculitis.  I will provide him with a limited number of tramadol every 8 hours. I will give him 30 tablets.    2. Cellulitis.  He is at high risk of getting infection. I will prescribe doxycycline twice a day for 5 days. I recommended a warm compress 3 times a day. I will prescribe mupirocin topical antibiotic to apply every time after that. I will send a note to Dr. Toni Arthurs and let him know that he is having a lot of discomfort. If it starts getting worse, if it is draining more, if it is getting more red, or it is getting more painful, he will let me know.    3. Hypertension.  His blood pressure is elevated today. I will increase his amlodipine to 5 mg twice a day. He will continue to hold losartan on discharge.    4. Constipation.  He will continue magnesium.    Follow-up  The patient will follow up in 4 to 5 weeks.      Preventive services addressed today  We did not review preventive services today  -- Patient verbalized an understanding of today's assessment and recommendations, as well as the purpose of ongoing medications.  Return in about 4 weeks (around 08/01/2022).     Subjective:   HPI  The patient presents for evaluation of multiple medical concerns.    During Christmas time, he noticed that his joints started hurting really bad and he noticed blood in his urine. He told Dr. Toni Arthurs who did a biopsy and then they called him and had him come in to the emergency room on Thursday and started steroids, steroid infusion package, rituximab, and breathing treatments. His original kidney failure was due to vasculitis. He was on dialysis for about 2 years and his wife donated his kidney. Everything was fine until COVID-19 hit him and then it flared back up. He has an appointment with urology tomorrow. He has an appointment with hematology for the infusion. When he does the infusion for the antirejection, they want to go ahead and do another rituximab. He is in pain. at home. His other blood pressure medication was stopped. The highest he has seen was 144/90 or 91. Losartan was stopped during hospitalization.  He remains on norvasc 5mg .     Supplemental Information  He has shortness of breath and cough every now and then. He denies any chest pain or heart racing.    Note from hospital team caring for patient upon discharge:  Mr. 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 While admitted he received IV steroids and Rituximab infusion in addition to pentamidine for PJP prophylaxis. He was discharged on prednisone taper consisting of 60mg  for 2 weeks and then 50mg  until follow up with Dr. Toni Arthurs. He will need to be scheduled for his next Rituximab infusion in 2  weeks and will need follow up for repeat Pentamidine infusion in approximately 4 weeks. He will need close follow up with Transplant Nephrology within 2 weeks. Please let me know if you have any other questions about his hospital stay.  Best,  Simone Curia, MD   ROS  A comprehensive review of systems was conducted with negative results except as noted in the HPI above.          Objective:   Vital Signs  BP 138/84 (BP Site: L Arm, BP Position: Sitting, BP Cuff Size: Large)  - Pulse 87  - Ht 179.5 cm (5' 10.67)  - Wt 91.7 kg (202 lb 3.2 oz)  - SpO2 96%  - BMI 28.47 kg/m??  Body mass index is 28.47 kg/m??.  Exam  Physical Exam  Vitals and nursing note reviewed.   Constitutional:       Appearance: Normal appearance.   HENT:      Head: Normocephalic and atraumatic.   Cardiovascular:      Rate and Rhythm: Normal rate.   Pulmonary:      Effort: Pulmonary effort is normal.   Skin:     Findings: Erythema present.      Comments: Right inguinal fold where patient had biopsy of his kidney notable for shallow 2 mm ulceration with surrounding erythema minimal tenderness to palpation.  No purulent material expressed at current time.  No regional at home. His other blood pressure medication was stopped. The highest he has seen was 144/90 or 91. Most of the time, it is less than that. At night, when he has pain, he gets a hypertension type of headache. It comes up to the front too. He is on losartan twice a day.    Supplemental Information  He has shortness of breath and cough every now and then. He denies any chest pain or heart racing.  ROS  A comprehensive review of systems was conducted with negative results except as noted in the HPI above.  {MDM Documentation Elements (Optional):21095089}        Objective:   Vital Signs  BP 150/98 (BP Site: L Arm, BP Position: Sitting, BP Cuff Size: Medium)  - Pulse 87  - Ht 179.5 cm (5' 10.67)  - Wt 91.7 kg (202 lb 3.2 oz)  - SpO2 96%  - BMI 28.47 kg/m??  Body mass index is 28.47 kg/m??.  Exam  Physical Exam  Vitals and nursing note reviewed.   Constitutional:       Appearance: Normal appearance.   HENT:      Head: Normocephalic and atraumatic.   Cardiovascular:      Rate and Rhythm: Normal rate.   Pulmonary:      Effort: Pulmonary effort is normal.   Skin:     Findings: Erythema present.      Comments: Right inguinal fold where patient had biopsy of his kidney notable for shallow 2 mm ulceration with surrounding erythema minimal tenderness to palpation.  No purulent material expressed at current time.  No regional lymphadenopathy.   Neurological:      General: No focal deficit present.      Mental Status: He is alert. Mental status is at baseline.   Psychiatric:         Mood and Affect: Mood normal.         Behavior: Behavior normal.         Thought Content: Thought content normal.         Judgment: Judgment normal.      {  Labs/Imaging/Pathology (Optional):21094520}     I've personally reviewed and summarized records in EPIC/Media and via CareEverywhere as well as medication, allergies, past medical, social, and family history.   I have reviewed the patient's medical history in detail and updated the computerized patient record.    Note - This chart has been prepared using the Dragon voice recognition system. Typographical errors may have occurred.  Attempts have been made to correct errors, however, inadvertent errors may persist.

## 2022-07-05 LAB — QUANTIFERON TB GOLD PLUS
QUANTIFERON ANTIGEN 1 MINUS NIL: 0 [IU]/mL
QUANTIFERON ANTIGEN 2 MINUS NIL: 0 [IU]/mL
QUANTIFERON MITOGEN: 3.96 [IU]/mL
QUANTIFERON TB GOLD PLUS: NEGATIVE
QUANTIFERON TB NIL VALUE: 0.07 [IU]/mL

## 2022-07-05 LAB — TB MITOGEN: TB MITOGEN VALUE: 4.03

## 2022-07-05 LAB — TB AG1: TB AG1 VALUE: 0.07

## 2022-07-05 LAB — TB AG2: TB AG2 VALUE: 0.07

## 2022-07-05 LAB — TB NIL: TB NIL VALUE: 0.07

## 2022-07-06 ENCOUNTER — Ambulatory Visit: Admit: 2022-07-06 | Discharge: 2022-07-07 | Payer: PRIVATE HEALTH INSURANCE

## 2022-07-06 DIAGNOSIS — Z940 Kidney transplant status: Principal | ICD-10-CM

## 2022-07-06 DIAGNOSIS — D849 Immunodeficiency, unspecified: Principal | ICD-10-CM

## 2022-07-06 DIAGNOSIS — R319 Hematuria, unspecified: Principal | ICD-10-CM

## 2022-07-06 MED ADMIN — sodium chloride irrigation (NS) 0.9 % irrigation solution 500 mL: 500 mL | @ 14:00:00 | Stop: 2022-07-06

## 2022-07-06 MED ADMIN — lidocaine 2% gel (XYLOCAINE) jelly urojet 20 mL: 20 mL | URETHRAL | @ 14:00:00 | Stop: 2022-07-06

## 2022-07-06 NOTE — Unmapped (Signed)
Patient stable on omeprazole.  At some point would benefit from getting an upper endoscopy for further evaluation.  We will plan for that with next colonoscopy.

## 2022-07-06 NOTE — Unmapped (Signed)
[facility]:  Taking Care of Yourself After Cystoscopy Procedures    *Drink plenty of water for a day or two following your procedure.  Try to have about 8 ounces (one cup) at a time, and do this 6 times or more per day.  (If you have fluid restrictions, please ask the nurse or doctor for advice).    *AVOID alcoholic, carbonated and caffeinated drinks for a day or two, as they may cause uncomfortable symptoms.    *For the first 8 hours after the procedure, your urine may be pink or red in color.  Small clots or a few drops of blood can be a normal side effect of the instruments.  Large amounts of bleeding or difficulty urinating are not normal.  Call your doctor if this happens.    *You may experience some mild discomfort of a burning sensation with urination after having this procedure.  If it does not improve, or if other symptoms appear (fever, chills, or difficulty emptying), call your doctor.    *You may return to normal daily activities such as work, school, driving, exercising and housework.    *If your doctor gave you a prescription, take it as ordered.    *If you need a return appointment, the secretary will make it for you when you check out. To contact the Urology Clinic during business hours, call [phone].    *[facility] Operator can be reached at [phone] if you need to get in contact with your doctor.  After the hours, the operator can page the doctor on call for urgent concerns:    Urology Patients should ask for the Urology resident “on call”.    You can get more immediate assistance at the Emergency Room or Urgent Care if necessary.

## 2022-07-06 NOTE — Unmapped (Signed)
Negative tuberculosis test .

## 2022-07-06 NOTE — Unmapped (Signed)
Cystoscopy Procedure Note  Ryan Moon presents today for cystoscopy.     Indications: Ryan Moon 37 y.o. year old with history of renal allograft and Cytoxan exposure now with hematuria and presenting for diagnostic cystoscopy.  Last upper tract imaging was 2-23 which noted that his bilateral native kidneys demonstrated cortical thinning but were otherwise unremarkable.    Diagnosis: Cytoxan exposure/hematuria    Time out was performed 8:45 AM    PROCEDURE: The risks of flexible cystourethroscopy which include but are not limited to infection, bleeding, and pain were explained and written informed consent was obtained.      Patient was brought into the procedure room. Patient was then placed in the supine position, prepped with betadine and sterilely draped. A timeout was performed confirming patient name, MRN, and procedure. Local anesthetic was administered per urethra with 2% lidocaine gel.     The urethra was cannulated with the flexible cystoscope, the bladder was inspected in its entirety. No tumors, stones, diverticulum, or lesions were identified.  The bilateral native ureteral orifices were normal.  His transplanted ureteral orifice was noted in the expected location and unremarkable.  Upon retroflexion of the cystoscope, the bladder neck was normal.  The urethra appeared normal, no lesions noted.      Findings:  Traebeculations: 1+  Prostatic urethra was approximately 1 cm in length  Prostate lobe hypertrophy: Minimal  Elevated median lobe: None    The patient tolerated the procedure and there were no complications.    The attending surgeon was present throughout the duration of the procedure and did perform all of the key aspects.      Findings:  Unremarkable cystoscopy    Assessment/Plan:  Ryan Moon 37 y.o. year old with history of renal allograft now with hematuria.  Today's diagnostic cystoscopy was unrevealing.  [ ]  Follow-up with nephrology as previously scheduled on 07/25/2022  [ ]  I would recommend upper tract imaging of his native kidneys to rule out undiagnosed mass to be the source of his bleeding. (I will message Dr. Elwyn Lade)    Ryan Moon. Dow Adolph, MD MPH  Assistant Professor  Department of Urology   [facility]  [id number]  [place], [place] [address]  p [phone] f [phone]

## 2022-07-06 NOTE — Unmapped (Signed)
Baseline appears ~12. More recently Hgb close to 10. Prior admission 07/05/21 for BRBPR, colonoscopy 07/07/21 nonbleeding external/internal hemorrhoids, 4 sessile polyps tubular adenoma. Denies blood in stool at this time. Iron panel, ferritin showed low iron 47, nl iron sat 27%, ferritin 682.3

## 2022-07-06 NOTE — Unmapped (Signed)
ANCA disease in transplanted kidney as seen on renal bx 06/26/22. Given Methylpred 500mg  IV x3 doses (1/26-1/28). Started on prednisone taper on day after discharge to continue Prednisone 60mg  for 2 weeks and then decrease to 50mg  until follow up with Dr. Toni Arthurs. Will need to discuss other options for immunosuppression since unable to receive Cytoxan in addition to Ritux due to leukopenia. Patient started on Pentamidine for PJP prophylaxis and received first dose on 1/27 in the setting of high dose steroids inpatient and probable continuation long term. Patient also discharged with instruction to start calcium and vitamin D supplementation in the setting of possible long term steroid use

## 2022-07-06 NOTE — Unmapped (Addendum)
Possible cause of rlq pain during hospitalization. He is at high risk of getting infection. I will prescribe doxycycline twice a day for 5 days. I recommended a warm compress 3 times a day. I will prescribe mupirocin topical antibiotic to apply every time after that. I will send a note to Dr. Toni Arthurs and let him know that he is having a lot of discomfort. If it starts getting worse, if it is draining more, if it is getting more red, or it is getting more painful, he will let me know.

## 2022-07-06 NOTE — Unmapped (Signed)
Losartan stopped with increase in bp.  Rec increase amlodipine to 5mg  bid.  Will leave it up to nephrology to start back losartan if negative.  Patient to continue to monitor bp at home.

## 2022-07-06 NOTE — Unmapped (Signed)
History of chronic stable leukopenia with mild neutropenia that continues this admission.  Has seen hematology as an outpatient (May 2023).  At that time workup overall reassuring and chronic leukopenia attributed to immunosuppression from kidney transplant.  Hematology was consulted during hospitalization and recommended against BMBx.

## 2022-07-06 NOTE — Unmapped (Signed)
Verify patient on calcium with vit d.  Monitor for elevation in blood sugar.  Continue stomach protection.

## 2022-07-09 DIAGNOSIS — D72819 Decreased white blood cell count, unspecified: Principal | ICD-10-CM

## 2022-07-09 DIAGNOSIS — D849 Immunodeficiency, unspecified: Principal | ICD-10-CM

## 2022-07-09 DIAGNOSIS — Z940 Kidney transplant status: Principal | ICD-10-CM

## 2022-07-09 NOTE — Unmapped (Signed)
Therapy Plan entered

## 2022-07-10 DIAGNOSIS — I7782 ANCA-associated vasculitis (CMS-HCC): Principal | ICD-10-CM

## 2022-07-10 DIAGNOSIS — Z940 Kidney transplant status: Principal | ICD-10-CM

## 2022-07-11 ENCOUNTER — Ambulatory Visit: Admit: 2022-07-11 | Discharge: 2022-07-12 | Payer: PRIVATE HEALTH INSURANCE

## 2022-07-11 LAB — URINALYSIS WITH MICROSCOPY WITH CULTURE REFLEX
BILIRUBIN UA: NEGATIVE
GLUCOSE UA: NEGATIVE
KETONES UA: NEGATIVE
LEUKOCYTE ESTERASE UA: NEGATIVE
NITRITE UA: NEGATIVE
PH UA: 7 (ref 5.0–9.0)
PROTEIN UA: 70 — AB
RBC UA: 44 /HPF — ABNORMAL HIGH (ref <=3)
SPECIFIC GRAVITY UA: 1.012 (ref 1.003–1.030)
SQUAMOUS EPITHELIAL: <1 /HPF (ref 0–5)
UROBILINOGEN UA: <2
WBC UA: 3 /HPF — ABNORMAL HIGH (ref <=2)

## 2022-07-11 LAB — CBC W/ AUTO DIFF
HEMATOCRIT: 30.3 % — ABNORMAL LOW (ref 39.0–48.0)
HEMOGLOBIN: 10.8 g/dL — ABNORMAL LOW (ref 12.9–16.5)
MEAN CORPUSCULAR HEMOGLOBIN CONC: 35.6 g/dL (ref 32.0–36.0)
MEAN CORPUSCULAR HEMOGLOBIN: 32.2 pg (ref 25.9–32.4)
MEAN CORPUSCULAR VOLUME: 90.6 fL (ref 77.6–95.7)
MEAN PLATELET VOLUME: 7.1 fL (ref 6.8–10.7)
PLATELET COUNT: 201 10*9/L (ref 150–450)
RED BLOOD CELL COUNT: 3.35 10*12/L — ABNORMAL LOW (ref 4.26–5.60)
RED CELL DISTRIBUTION WIDTH: 14.3 % (ref 12.2–15.2)
WBC ADJUSTED: 2.5 10*9/L — ABNORMAL LOW (ref 3.6–11.2)

## 2022-07-11 LAB — MANUAL DIFFERENTIAL
BASOPHILS - ABS (DIFF): 0 10*9/L (ref 0.0–0.1)
BASOPHILS - REL (DIFF): 0 %
EOSINOPHILS - ABS (DIFF): 0.1 10*9/L (ref 0.0–0.5)
EOSINOPHILS - REL (DIFF): 3 %
LYMPHOCYTES - ABS (DIFF): 0.6 10*9/L — ABNORMAL LOW (ref 1.1–3.6)
LYMPHOCYTES - REL (DIFF): 23 %
MONOCYTES - ABS (DIFF): 0.4 10*9/L (ref 0.3–0.8)
MONOCYTES - REL (DIFF): 14 %
NEUTROPHILS - ABS (DIFF): 1.5 10*9/L — ABNORMAL LOW (ref 1.8–7.8)
NEUTROPHILS - REL (DIFF): 60 %

## 2022-07-11 LAB — RENAL FUNCTION PANEL
ALBUMIN: 3.4 g/dL (ref 3.4–5.0)
ANION GAP: 7 mmol/L (ref 5–14)
BLOOD UREA NITROGEN: 36 mg/dL — ABNORMAL HIGH (ref 9–23)
BUN / CREAT RATIO: 17
CALCIUM: 10 mg/dL (ref 8.7–10.4)
CHLORIDE: 101 mmol/L (ref 98–107)
CO2: 29 mmol/L (ref 20.0–31.0)
CREATININE: 2.12 mg/dL — ABNORMAL HIGH
EGFR CKD-EPI (2021) MALE: 40 mL/min/{1.73_m2} — ABNORMAL LOW (ref >=60)
GLUCOSE RANDOM: 98 mg/dL (ref 70–179)
PHOSPHORUS: 3.1 mg/dL (ref 2.4–5.1)
POTASSIUM: 3.6 mmol/L (ref 3.4–4.8)
SODIUM: 137 mmol/L (ref 135–145)

## 2022-07-11 LAB — MAGNESIUM: MAGNESIUM: 1.9 mg/dL (ref 1.6–2.6)

## 2022-07-11 MED ADMIN — belatacept (NULOJIX) 462.5 mg in sodium chloride (NS) 0.9 % 100 mL IVPB: 5 mg/kg | INTRAVENOUS | @ 15:00:00 | Stop: 2022-07-11

## 2022-07-11 NOTE — Unmapped (Signed)
1000 Patient arrived to the Transplant Infusion Room today for belatacept Belatacept 462.5 mg, Condition: well; Mobility: ambulating; accompanied by self.   See Flowsheet and MAR for all details of visit.   1004 VS stable.  1025 PIV placed and secured with coban; labs collected and sent, urine collected and sent.  1027 Infusion initiated. Patient educated to notify nursing staff if they experience urticaria, erythema, nausea, shortness of breath, nausea, metal taste in mouth, excessive oral or postnasal drainage and any other changes in status which could be side effects from initiating this medication.  1101 Infusion complete.   1110 VS stable, Line flushed with NS, PIV removed and secured with coban. Patient left clinic today, Condition: well; Mobility: ambulating; accompanied by self.

## 2022-07-12 DIAGNOSIS — Z940 Kidney transplant status: Principal | ICD-10-CM

## 2022-07-15 NOTE — Unmapped (Signed)
TNC returned pt call, pt has appt in the AM for infusion, pt c/o of pain in knees and crawling to the bathroom and thinks it is related to prednisone, pt stated he was fine all week and woke up this AM with constant throbbing in both knees. Pt advised to go to local urgent care if he is comfortable with that or [facility] ED in [place], primary TNC to follow up in AM

## 2022-07-16 ENCOUNTER — Ambulatory Visit: Admit: 2022-07-16 | Discharge: 2022-07-17 | Payer: PRIVATE HEALTH INSURANCE

## 2022-07-16 DIAGNOSIS — Z940 Kidney transplant status: Principal | ICD-10-CM

## 2022-07-16 DIAGNOSIS — I10 Essential (primary) hypertension: Principal | ICD-10-CM

## 2022-07-16 DIAGNOSIS — I7789 Other specified disorders of arteries and arterioles: Principal | ICD-10-CM

## 2022-07-16 LAB — COMPREHENSIVE METABOLIC PANEL
ALBUMIN: 3 g/dL — ABNORMAL LOW (ref 3.4–5.0)
ALKALINE PHOSPHATASE: 72 U/L (ref 46–116)
ALT (SGPT): 67 U/L — ABNORMAL HIGH (ref 10–49)
ANION GAP: 9 mmol/L (ref 5–14)
AST (SGOT): 45 U/L — ABNORMAL HIGH (ref <=34)
BILIRUBIN TOTAL: 0.3 mg/dL (ref 0.3–1.2)
BLOOD UREA NITROGEN: 43 mg/dL — ABNORMAL HIGH (ref 9–23)
BUN / CREAT RATIO: 22
CALCIUM: 8.8 mg/dL (ref 8.7–10.4)
CHLORIDE: 108 mmol/L — ABNORMAL HIGH (ref 98–107)
CO2: 26 mmol/L (ref 20.0–31.0)
CREATININE: 1.98 mg/dL — ABNORMAL HIGH
EGFR CKD-EPI (2021) MALE: 44 mL/min/{1.73_m2} — ABNORMAL LOW (ref >=60)
GLUCOSE RANDOM: 111 mg/dL (ref 70–179)
POTASSIUM: 3.5 mmol/L (ref 3.4–4.8)
PROTEIN TOTAL: 6.1 g/dL (ref 5.7–8.2)
SODIUM: 143 mmol/L (ref 135–145)

## 2022-07-16 LAB — CBC W/ AUTO DIFF
BASOPHILS ABSOLUTE COUNT: 0 10*9/L (ref 0.0–0.1)
BASOPHILS RELATIVE PERCENT: 0.3 %
EOSINOPHILS ABSOLUTE COUNT: 0 10*9/L (ref 0.0–0.5)
EOSINOPHILS RELATIVE PERCENT: 1 %
HEMATOCRIT: 27.8 % — ABNORMAL LOW (ref 39.0–48.0)
HEMOGLOBIN: 9.6 g/dL — ABNORMAL LOW (ref 12.9–16.5)
LYMPHOCYTES ABSOLUTE COUNT: 0.2 10*9/L — ABNORMAL LOW (ref 1.1–3.6)
LYMPHOCYTES RELATIVE PERCENT: 10.3 %
MEAN CORPUSCULAR HEMOGLOBIN CONC: 34.6 g/dL (ref 32.0–36.0)
MEAN CORPUSCULAR HEMOGLOBIN: 31.8 pg (ref 25.9–32.4)
MEAN CORPUSCULAR VOLUME: 92.1 fL (ref 77.6–95.7)
MEAN PLATELET VOLUME: 7.6 fL (ref 6.8–10.7)
MONOCYTES ABSOLUTE COUNT: 0.3 10*9/L (ref 0.3–0.8)
MONOCYTES RELATIVE PERCENT: 15.3 %
NEUTROPHILS ABSOLUTE COUNT: 1.5 10*9/L — ABNORMAL LOW (ref 1.8–7.8)
NEUTROPHILS RELATIVE PERCENT: 73.1 %
PLATELET COUNT: 182 10*9/L (ref 150–450)
RED BLOOD CELL COUNT: 3.02 10*12/L — ABNORMAL LOW (ref 4.26–5.60)
RED CELL DISTRIBUTION WIDTH: 14.2 % (ref 12.2–15.2)
WBC ADJUSTED: 2 10*9/L — ABNORMAL LOW (ref 3.6–11.2)

## 2022-07-16 LAB — SLIDE REVIEW

## 2022-07-16 LAB — PROTEIN / CREATININE RATIO, URINE
CREATININE, URINE: 82 mg/dL
PROTEIN URINE: 130.5 mg/dL
PROTEIN/CREAT RATIO, URINE: 1.591

## 2022-07-16 MED ORDER — AMLODIPINE 5 MG TABLET
ORAL_TABLET | Freq: Two times a day (BID) | ORAL | 3 refills | 90 days | Status: CP
Start: 2022-07-16 — End: 2023-07-16

## 2022-07-16 MED ORDER — ALPRAZOLAM 0.5 MG TABLET
ORAL_TABLET | Freq: Two times a day (BID) | ORAL | 0 refills | 5 days | Status: CP | PRN
Start: 2022-07-16 — End: ?

## 2022-07-16 MED ORDER — ONDANSETRON 4 MG DISINTEGRATING TABLET
ORAL_TABLET | Freq: Three times a day (TID) | ORAL | 0 refills | 10 days | Status: CP | PRN
Start: 2022-07-16 — End: ?

## 2022-07-16 MED ORDER — TRAMADOL 50 MG TABLET
ORAL_TABLET | Freq: Three times a day (TID) | ORAL | 0 refills | 10 days | Status: CP | PRN
Start: 2022-07-16 — End: 2023-07-16

## 2022-07-16 MED ADMIN — sodium chloride (NS) 0.9 % infusion: 20 mL/h | INTRAVENOUS | @ 14:00:00 | Stop: 2022-07-16

## 2022-07-16 MED ADMIN — diphenhydrAMINE (BENADRYL) injection 25 mg: 25 mg | INTRAVENOUS | @ 14:00:00 | Stop: 2022-07-16

## 2022-07-16 MED ADMIN — acetaminophen (TYLENOL) tablet 650 mg: 650 mg | ORAL | @ 14:00:00 | Stop: 2022-07-16

## 2022-07-16 MED ADMIN — riTUXimab-abbs (TRUXIMA) 1,000 mg in sodium chloride (NS) 0.9 % 640 mL IVPB: 1000 mg | INTRAVENOUS | @ 15:00:00 | Stop: 2022-07-16

## 2022-07-16 MED ADMIN — methylPREDNISolone sodium succinate (PF) (SOLU-Medrol) injection 125 mg: 125 mg | INTRAVENOUS | @ 14:00:00 | Stop: 2022-07-16

## 2022-07-16 MED ADMIN — furosemide (LASIX) injection 40 mg: 40 mg | INTRAVENOUS | @ 18:00:00 | Stop: 2022-07-16

## 2022-07-16 NOTE — Unmapped (Signed)
1610 Patient arrived to the Transplant Infusion room for Rituximab 1000 mg infusion. Condition: not well today, Mobility: ambulating, accompanied by spouse.   See Flowsheet and MAR for all details of visit.  0825 VS stable.   9604 PIV placed and secured with coban, labs collected and sent; urine collected and sent.  0845 Premedications given.  Today's lab results: WBC 2.0 (must be >2.0 or hold infusion and notify provider) and Platelets 182 (must be > 50 or hold infusion and notify provider).   5409 Rituximab infusion initiated rituximab: rituximab subsequent infusion protocol to start 100 mg/hr, rate increase by 100 mg/hr every 30 minutes as tolerated till maximum rate of 400 mg/hr. Patient educated to notify nursing staff if they experience urticaria, erythema, nausea, shortness of breath, metallic taste in mouth, excessive oral or postnasal drainage and any other changes in status which could be side effects from initiating this medication.  1314 Rituximab infusion completed, pt tolerated infusion well.  1330 VS stable, PIV removed and site secured with coban. Patient left clinic today, condition: well; mobility: ambulating; accompanied by spouse.

## 2022-07-16 NOTE — Unmapped (Signed)
Left detailed message on identified voicemail that PCP advises to be evaluated in ER today for knee pain and inability to walk without assistance.

## 2022-07-16 NOTE — Unmapped (Signed)
Patient is requesting the following refill  Requested Prescriptions     Pending Prescriptions Disp Refills    ondansetron (ZOFRAN-ODT) 4 MG disintegrating tablet 30 tablet 0     Sig: Take 1 tablet (4 mg total) by mouth every eight (8) hours as needed.    ALPRAZolam (XANAX) 0.5 MG tablet 10 tablet 0     Sig: Take 1 tablet (0.5 mg total) by mouth two (2) times a day as needed for anxiety.    traMADol (ULTRAM) 50 mg tablet 30 tablet 0     Sig: Take 1 tablet (50 mg total) by mouth every eight (8) hours as needed for pain.    amlodipine (NORVASC) 5 MG tablet 180 tablet 3     Sig: Take 1 tablet (5 mg total) by mouth two (2) times a day.       Order pended. Please advise. Thanks    Last OV: 07/04/2022   Next OV: Visit date not found

## 2022-07-17 NOTE — Unmapped (Signed)
Nurse notified patient.  Patient states he will either deal with the pain or seek treatment at [facility].

## 2022-07-18 MED FILL — OMEPRAZOLE 20 MG CAPSULE,DELAYED RELEASE: ORAL | 30 days supply | Qty: 30 | Fill #11

## 2022-07-19 DIAGNOSIS — I10 Essential (primary) hypertension: Principal | ICD-10-CM

## 2022-07-19 DIAGNOSIS — I7789 Other specified disorders of arteries and arterioles: Principal | ICD-10-CM

## 2022-07-19 MED ORDER — TRAMADOL 50 MG TABLET
ORAL_TABLET | Freq: Three times a day (TID) | ORAL | 0 refills | 10 days | Status: CP | PRN
Start: 2022-07-19 — End: 2023-07-19

## 2022-07-19 MED ORDER — AMLODIPINE 5 MG TABLET
ORAL_TABLET | Freq: Two times a day (BID) | ORAL | 3 refills | 90 days | Status: CP
Start: 2022-07-19 — End: 2023-07-19

## 2022-07-19 MED ORDER — ONDANSETRON 4 MG DISINTEGRATING TABLET
ORAL_TABLET | Freq: Three times a day (TID) | ORAL | 0 refills | 10 days | Status: CP | PRN
Start: 2022-07-19 — End: ?

## 2022-07-19 MED ORDER — ALPRAZOLAM 0.5 MG TABLET
ORAL_TABLET | Freq: Two times a day (BID) | ORAL | 0 refills | 5 days | Status: CP | PRN
Start: 2022-07-19 — End: ?

## 2022-07-19 NOTE — Unmapped (Signed)
His prescriptions were sent to [facility]. Patient requesting medication be sent to his local pharmacy.  Please advise.

## 2022-07-20 NOTE — Unmapped (Signed)
Nurse left a message on patient identified machine to please call the office regarding one of his medications was denied by insurance.

## 2022-07-23 DIAGNOSIS — Z940 Kidney transplant status: Principal | ICD-10-CM

## 2022-07-24 NOTE — Unmapped (Signed)
Nurse notified patient.  Patient states he ended up throwing away with Tramadol because the combination of the Tylenol and Tramadol made him vomit.

## 2022-07-25 ENCOUNTER — Ambulatory Visit
Admit: 2022-07-25 | Discharge: 2022-07-26 | Payer: PRIVATE HEALTH INSURANCE | Attending: Nephrology | Primary: Nephrology

## 2022-07-25 DIAGNOSIS — I7782 ANCA-associated vasculitis (CMS-HCC): Principal | ICD-10-CM

## 2022-07-25 DIAGNOSIS — Z940 Kidney transplant status: Principal | ICD-10-CM

## 2022-07-25 DIAGNOSIS — L03311 Cellulitis of abdominal wall: Principal | ICD-10-CM

## 2022-07-25 LAB — PROTEIN / CREATININE RATIO, URINE
CREATININE, URINE: 21.5 mg/dL
PROTEIN URINE: 52.9 mg/dL
PROTEIN/CREAT RATIO, URINE: 2.46

## 2022-07-25 LAB — URINALYSIS WITH MICROSCOPY
BILIRUBIN UA: NEGATIVE
GLUCOSE UA: NEGATIVE
KETONES UA: NEGATIVE
LEUKOCYTE ESTERASE UA: NEGATIVE
NITRITE UA: NEGATIVE
PH UA: 6 (ref 5.0–9.0)
PROTEIN UA: 30 — AB
RBC UA: 5 /HPF — ABNORMAL HIGH (ref <3)
SPECIFIC GRAVITY UA: <=1.005 (ref 1.005–1.030)
SQUAMOUS EPITHELIAL: <1 /HPF (ref 0–5)
UROBILINOGEN UA: 0.2
WBC UA: 2 /HPF — ABNORMAL HIGH (ref <2)

## 2022-07-25 MED ORDER — PREDNISONE 20 MG TABLET
ORAL_TABLET | ORAL | 0 refills | 28 days | Status: CP
Start: 2022-07-25 — End: 2022-08-22

## 2022-07-25 MED ORDER — DOXYCYCLINE HYCLATE 100 MG TABLET
ORAL_TABLET | Freq: Two times a day (BID) | ORAL | 0 refills | 7 days | Status: CP
Start: 2022-07-25 — End: ?

## 2022-07-25 MED ORDER — SULFAMETHOXAZOLE 400 MG-TRIMETHOPRIM 80 MG TABLET
ORAL_TABLET | ORAL | 2 refills | 30 days | Status: CP
Start: 2022-07-25 — End: ?

## 2022-07-25 MED ORDER — LOSARTAN 50 MG TABLET
ORAL_TABLET | Freq: Two times a day (BID) | ORAL | 11 refills | 30 days | Status: CP
Start: 2022-07-25 — End: 2023-07-25

## 2022-07-25 NOTE — Unmapped (Signed)
Transplant Coordinator, Clinic Visit   Pt seen today by transplant nephrology for follow up, reviewed medications and symptoms.          07/25/22 1440   BP: 133/93   Pulse: 90   Temp: 36.1 ??C (97 ??F)   Weight: 93.5 kg (206 lb 3.2 oz)   PainSc: 8    PainLoc: Generalized       Assessment  BP: reports home numbers of 120's/80's  Lightheaded: denies  Headache: denies  Hand tremors: none  Numbness/tingling: none  Fevers: none  Chills/sweats: denies  Shortness of breath: denies  Chest pain or pressure: none  Palpitations: none  Abdominal pain: none  Heart burn: none  Nausea/vomiting: report of vomiting after taking Tramadol for pain  Diarrhea/constipation: none  UTI symptoms: none  Swelling: none  Sleep: sleeping well  Pain: endorses bone and joint pain post-Rituximab. Prescribed Tramadol by PCP which he reports he does not tolerate.  Currently taking 200 mg Gabapentin TID.      Good appetite; reports adequate hydration.     Intake: reports adequate  Output: reports adequate    Any new medications? none  Immunosuppressant last taken: Belatacept      Functional Score: 100   Normal no complaints; no evidence of  disease.   Employment status is: works full time    I spent a total of 20 minutes with Ryan Moon reviewing medications and symptoms.

## 2022-07-31 ENCOUNTER — Institutional Professional Consult (permissible substitution): Admit: 2022-07-31 | Payer: PRIVATE HEALTH INSURANCE

## 2022-08-03 DIAGNOSIS — D72819 Decreased white blood cell count, unspecified: Principal | ICD-10-CM

## 2022-08-03 DIAGNOSIS — D849 Immunodeficiency, unspecified: Principal | ICD-10-CM

## 2022-08-03 DIAGNOSIS — Z940 Kidney transplant status: Principal | ICD-10-CM

## 2022-08-03 NOTE — Unmapped (Addendum)
[facility] transplant nephrology clinic visit    assessment and plan  1. 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 baseline creatinine 1.5-2 mg/dl. stage iiia chr kidney disease. no donor specific hla ab detected '23.  2. immunosuppression. belatacept 5mg /kg q.month. prednisone taper: 40mg  daily x2 wks, 30mg  daily x2 wks, then decr by 5mg  q2wks to 5mg  daily maintenance. no mycophenolate d/t leukopenia.  3. mpo-anca+ recurrent gn. iv methylprednisolone; prednisone taper. rituximab 1g x2 1-2/24.  4. hypertension. +losartan 50mg  bid. hold amlodipine. blood pressure goal <130/80 mmhg.  5. preventive medicine. influenza '21. pcv13 pnuemococcal '19. ppsv23 pneumococcal '19. covid-19 vaccine recommended. trimethoprim/sulfamethoxazole 80/400mg  3x/week x5m 2-5/24.    history of present illness    [name] is a 37 year old gentleman seen in follow up post kidney transplant 04/28/2019. mpo-anca+ recurrent gn on kidney bx 01/24. trtmt methylprednisolone 500mg  daily x3d, prednisone 60mg  daily, and rituximab 1g 1/27 and 2/12. +delayed arthralgias following rituximab infusion, improving.     past medical hx:  1. 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 baseline creatinine 1.5-1.8 mg/dl.   > kidney bx 01/21: no acute/chr rejection.  > kidney bx 01/24: mpo-anca+ pauci immune gn  2. hypertension  3. anxiety/depression    allergies: no known drug allergies    medications: belatacept 5mg /kg q.month, prednisone 60mg  daily, amlodipine 5mg  pm, mg oxide 133mg  daily, omeprazole 20mg  daily, alprazolam 0.5mg  prn.    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    physical exam: t98 p72 bp123/83 wt81.6kg bmi 24.4. wd/wn gentleman appropriate affect and mood. sclera anicteric. mmm no thrush. neck supple no palpable ln. heart rrr nl s1s2 no m/r/g. lungs clear bilateral. abd soft nt/nd. no lower ext edema. msk no synovitis/tophi. skin no rash. neuro alert oriented non focal exam.

## 2022-08-08 ENCOUNTER — Ambulatory Visit: Admit: 2022-08-08 | Discharge: 2022-08-09 | Payer: PRIVATE HEALTH INSURANCE

## 2022-08-08 ENCOUNTER — Institutional Professional Consult (permissible substitution): Admit: 2022-08-08 | Discharge: 2022-08-09 | Payer: PRIVATE HEALTH INSURANCE

## 2022-08-08 ENCOUNTER — Encounter
Admit: 2022-08-08 | Discharge: 2022-08-09 | Payer: PRIVATE HEALTH INSURANCE | Attending: Nephrology | Primary: Nephrology

## 2022-08-08 LAB — CBC W/ AUTO DIFF
BASOPHILS ABSOLUTE COUNT: 0 10*9/L (ref 0.0–0.1)
BASOPHILS RELATIVE PERCENT: 0.6 %
EOSINOPHILS ABSOLUTE COUNT: 0 10*9/L (ref 0.0–0.5)
EOSINOPHILS RELATIVE PERCENT: 1.2 %
HEMATOCRIT: 34.8 % — ABNORMAL LOW (ref 39.0–48.0)
HEMOGLOBIN: 12.1 g/dL — ABNORMAL LOW (ref 12.9–16.5)
LYMPHOCYTES ABSOLUTE COUNT: 0.4 10*9/L — ABNORMAL LOW (ref 1.1–3.6)
LYMPHOCYTES RELATIVE PERCENT: 30.3 %
MEAN CORPUSCULAR HEMOGLOBIN CONC: 34.8 g/dL (ref 32.0–36.0)
MEAN CORPUSCULAR HEMOGLOBIN: 31.6 pg (ref 25.9–32.4)
MEAN CORPUSCULAR VOLUME: 90.6 fL (ref 77.6–95.7)
MEAN PLATELET VOLUME: 7.5 fL (ref 6.8–10.7)
MONOCYTES ABSOLUTE COUNT: 0.3 10*9/L (ref 0.3–0.8)
MONOCYTES RELATIVE PERCENT: 25.4 %
NEUTROPHILS ABSOLUTE COUNT: 0.5 10*9/L — ABNORMAL LOW (ref 1.8–7.8)
NEUTROPHILS RELATIVE PERCENT: 42.5 %
PLATELET COUNT: 166 10*9/L (ref 150–450)
RED BLOOD CELL COUNT: 3.84 10*12/L — ABNORMAL LOW (ref 4.26–5.60)
RED CELL DISTRIBUTION WIDTH: 14.6 % (ref 12.2–15.2)
WBC ADJUSTED: 1.2 10*9/L — ABNORMAL LOW (ref 3.6–11.2)

## 2022-08-08 LAB — COMPREHENSIVE METABOLIC PANEL
ALBUMIN: 3.6 g/dL (ref 3.4–5.0)
ALKALINE PHOSPHATASE: 74 U/L (ref 46–116)
ALT (SGPT): 25 U/L (ref 10–49)
ANION GAP: 9 mmol/L (ref 5–14)
AST (SGOT): 22 U/L (ref <=34)
BILIRUBIN TOTAL: 0.4 mg/dL (ref 0.3–1.2)
BLOOD UREA NITROGEN: 26 mg/dL — ABNORMAL HIGH (ref 9–23)
BUN / CREAT RATIO: 14
CALCIUM: 9.4 mg/dL (ref 8.7–10.4)
CHLORIDE: 103 mmol/L (ref 98–107)
CO2: 26 mmol/L (ref 20.0–31.0)
CREATININE: 1.84 mg/dL — ABNORMAL HIGH
EGFR CKD-EPI (2021) MALE: 48 mL/min/{1.73_m2} — ABNORMAL LOW (ref >=60)
GLUCOSE RANDOM: 81 mg/dL (ref 70–99)
POTASSIUM: 3.5 mmol/L (ref 3.4–4.8)
PROTEIN TOTAL: 7.1 g/dL (ref 5.7–8.2)
SODIUM: 138 mmol/L (ref 135–145)

## 2022-08-08 LAB — LIPID PANEL
CHOLESTEROL/HDL RATIO SCREEN: 2.7 (ref 1.0–4.5)
CHOLESTEROL: 233 mg/dL — ABNORMAL HIGH (ref <=200)
HDL CHOLESTEROL: 85 mg/dL — ABNORMAL HIGH (ref 40–60)
LDL CHOLESTEROL CALCULATED: 123 mg/dL — ABNORMAL HIGH (ref 40–99)
NON-HDL CHOLESTEROL: 148 mg/dL — ABNORMAL HIGH (ref 70–130)
TRIGLYCERIDES: 125 mg/dL (ref 0–150)
VLDL CHOLESTEROL CAL: 25 mg/dL (ref 10–50)

## 2022-08-08 LAB — MAGNESIUM: MAGNESIUM: 1.8 mg/dL (ref 1.6–2.6)

## 2022-08-08 LAB — CMV DNA, QUANTITATIVE, PCR: CMV VIRAL LD: NOT DETECTED

## 2022-08-08 LAB — BILIRUBIN, DIRECT: BILIRUBIN DIRECT: 0.1 mg/dL (ref 0.00–0.30)

## 2022-08-08 LAB — PHOSPHORUS: PHOSPHORUS: 2.3 mg/dL — ABNORMAL LOW (ref 2.4–5.1)

## 2022-08-08 LAB — SLIDE REVIEW

## 2022-08-08 LAB — BK VIRUS QUANTITATIVE PCR, BLOOD: BK BLOOD RESULT: NOT DETECTED

## 2022-08-08 MED ADMIN — pentamidine (PENTAM) inhalation solution: 300 mg | RESPIRATORY_TRACT | @ 16:00:00 | Stop: 2022-08-08

## 2022-08-08 MED ADMIN — belatacept (NULOJIX) 437.5 mg in sodium chloride (NS) 0.9 % 100 mL IVPB: 5 mg/kg | INTRAVENOUS | @ 15:00:00 | Stop: 2022-08-08

## 2022-08-08 MED ADMIN — albuterol 2.5 mg /3 mL (0.083 %) nebulizer solution 2.5 mg: 2.5 mg | RESPIRATORY_TRACT | @ 16:00:00 | Stop: 2022-08-08

## 2022-08-08 NOTE — Unmapped (Signed)
0900 Patient arrived to the Transplant Infusion Room today for belatacept 437.5 mg , Condition: well; Mobility: ambulating; accompanied by self.   See Flowsheet and MAR for all details of visit.   0902 VS stable.  1610 PIV placed and secured with coban; labs collected and sent, urine collected and sent.  9604 Infusion initiated. Patient educated to notify nursing staff if they experience urticaria, erythema, nausea, shortness of breath, nausea, metal taste in mouth, excessive oral or postnasal drainage and any other changes in status which could be side effects from initiating this medication.  1015 Infusion complete.   1028 VS stable, Line flushed with NS, PIV removed and secured with coban. Patient left clinic today, Condition: well; Mobility: ambulating; accompanied by self.

## 2022-08-08 NOTE — Unmapped (Signed)
Per providers orders, Albuterol and Pentamidine treatments were administered.  Patient tolerated it well with no complication.  See MAR for administration info.

## 2022-08-10 DIAGNOSIS — U071 COVID: Principal | ICD-10-CM

## 2022-08-10 MED ORDER — PAXLOVID 150 MG-100 MG TABLETS IN A DOSE PACK (RENAL DOSE)
ORAL_TABLET | 0 refills | 0 days | Status: CP
Start: 2022-08-10 — End: ?

## 2022-08-10 NOTE — Unmapped (Signed)
Pt called and tested + for Covid, Per Dr. Toni Arthurs pt to start paxlovid BID for 5 days, pt to stay on prednisone of 20 mg of taper, primary TNC to check in next week, pt to increase fluids and go to ED or urgent care of has difficulty breathing or gets worse.

## 2022-08-15 MED ORDER — OMEPRAZOLE 20 MG CAPSULE,DELAYED RELEASE
ORAL_CAPSULE | Freq: Every day | ORAL | 11 refills | 30 days | Status: CP
Start: 2022-08-15 — End: 2023-08-15

## 2022-08-15 NOTE — Unmapped (Signed)
Called patient regarding recent positive COVID test.  He states he is feeling much better and no longer has any symptoms.  He has no needs or concerns at this time.

## 2022-08-17 MED ORDER — OMEPRAZOLE 20 MG CAPSULE,DELAYED RELEASE
ORAL_CAPSULE | Freq: Every day | ORAL | 11 refills | 30 days | Status: CP
Start: 2022-08-17 — End: 2023-08-17

## 2022-08-17 MED ORDER — LOSARTAN 50 MG TABLET
ORAL_TABLET | Freq: Two times a day (BID) | ORAL | 11 refills | 30.00000 days | Status: CP
Start: 2022-08-17 — End: 2023-08-17
  Filled 2022-11-08: qty 60, 30d supply, fill #0

## 2022-08-17 MED ORDER — PREDNISONE 20 MG TABLET
ORAL_TABLET | 2 refills | 0 days | Status: CP
Start: 2022-08-17 — End: ?

## 2022-08-17 NOTE — Unmapped (Signed)
Refills sent for omeprazole and losartan to local [facility] per pt request.

## 2022-08-17 NOTE — Unmapped (Signed)
Spo 

## 2022-08-19 LAB — HLA DS POST TRANSPLANT
ANTI-DONOR DRW #1 MFI: 0 MFI
ANTI-DONOR DRW #2 MFI: 4 MFI
ANTI-DONOR HLA-A #1 MFI: 14 MFI
ANTI-DONOR HLA-A #2 MFI: 31 MFI
ANTI-DONOR HLA-B #1 MFI: 0 MFI
ANTI-DONOR HLA-B #2 MFI: 13 MFI
ANTI-DONOR HLA-C #2 MFI: 2 MFI
ANTI-DONOR HLA-DP AG #1 MFI: 17 MFI
ANTI-DONOR HLA-DQB #1 MFI: 15 MFI
ANTI-DONOR HLA-DQB #2 MFI: 9 MFI
ANTI-DONOR HLA-DR #1 MFI: 17 MFI
ANTI-DONOR HLA-DR #2 MFI: 17 MFI

## 2022-08-19 LAB — FSAB CLASS 1 ANTIBODY SPECIFICITY: HLA CLASS 1 ANTIBODY RESULT: NEGATIVE

## 2022-08-19 LAB — FSAB CLASS 2 ANTIBODY SPECIFICITY: HLA CL2 AB RESULT: NEGATIVE

## 2022-08-21 MED ORDER — OMEPRAZOLE 20 MG CAPSULE,DELAYED RELEASE
ORAL_CAPSULE | Freq: Every day | ORAL | 11 refills | 30 days
Start: 2022-08-21 — End: 2023-08-21

## 2022-08-22 MED ORDER — OMEPRAZOLE 20 MG CAPSULE,DELAYED RELEASE
ORAL_CAPSULE | Freq: Every day | ORAL | 11 refills | 30 days | Status: CP
Start: 2022-08-22 — End: 2023-08-22
  Filled 2022-08-23: qty 30, 30d supply, fill #0

## 2022-08-22 MED ORDER — PREDNISONE 5 MG TABLET
ORAL_TABLET | Freq: Every day | ORAL | 0 refills | 72.00000 days | Status: CP
Start: 2022-08-22 — End: 2022-11-02
  Filled 2022-08-23: qty 170, 72d supply, fill #0

## 2022-08-22 NOTE — Unmapped (Signed)
[facility] Refill Coordination Note    MARSTON SHEWCHUK, DOB: 06/11/1985  Phone: [phone] (home) [phone] (work)      All above HIPAA information was verified with patient.         08/21/2022     5:05 PM   Specialty Rx Medication Refill Questionnaire   Which Medications would you like refilled and shipped? Prednisone 5 mg, Omeprezole 20 mg cp, Mg/plus protein 133mg , Losartan potassium 50 mg   Please list all current allergies: Tylenol   Have you missed any doses in the last 30 days? No   Have you had any changes to your medication(s) since your last refill? No   How many days remaining of each medication do you have at home? 4   If receiving an injectable medication, next injection date is 09/09/2022   Have you experienced any side effects in the last 30 days? No   Please enter the full address (street address, city, state, zip code) where you would like your medication(s) to be delivered to. [address] [place] [address]   Please specify on which day you would like your medication(s) to arrive. Note: if you need your medication(s) within 3 days, please call the pharmacy to schedule your order at [phone]  08/23/2022   Has your insurance changed since your last refill? No   Would you like a pharmacist to call you to discuss your medication(s)? No   Do you require a signature for your package? (Note: if we are billing Medicare Part B or your order contains a controlled substance, we will require a signature) No         Completed refill call assessment today to schedule patient's medication shipment from the [facility] [phone]).  All relevant notes have been reviewed.       Confirmed patient received a Conservation officer, historic buildings and a Surveyor, mining with first shipment. The patient will receive a drug information handout for each medication shipped and additional FDA Medication Guides as required.         REFERRAL TO PHARMACIST     Referral to the pharmacist: Not needed      [facility]     Shipping address confirmed in Epic.     Delivery Scheduled: Yes, Expected medication delivery date: 08/23/22.     Medication will be delivered via UPS to the prescription address in Epic WAM.    Ernestine Mcmurray   [facility] Specialty Technician

## 2022-08-23 MED FILL — MG-PLUS-PROTEIN 133 MG TABLET: ORAL | 90 days supply | Qty: 90 | Fill #3

## 2022-08-24 ENCOUNTER — Ambulatory Visit: Admit: 2022-08-24 | Discharge: 2022-08-25 | Payer: PRIVATE HEALTH INSURANCE

## 2022-08-24 LAB — BASIC METABOLIC PANEL
ANION GAP: 6 mmol/L (ref 5–14)
BLOOD UREA NITROGEN: 22 mg/dL (ref 9–23)
BUN / CREAT RATIO: 11
CALCIUM: 10.3 mg/dL (ref 8.7–10.4)
CHLORIDE: 106 mmol/L (ref 98–107)
CO2: 26.2 mmol/L (ref 20.0–31.0)
CREATININE: 1.94 mg/dL — ABNORMAL HIGH
EGFR CKD-EPI (2021) MALE: 45 mL/min/{1.73_m2} — ABNORMAL LOW (ref >=60)
GLUCOSE RANDOM: 116 mg/dL (ref 70–179)
POTASSIUM: 3.7 mmol/L (ref 3.4–4.8)
SODIUM: 138 mmol/L (ref 135–145)

## 2022-08-24 LAB — CBC W/ AUTO DIFF
BASOPHILS ABSOLUTE COUNT: 0 10*9/L (ref 0.0–0.1)
BASOPHILS RELATIVE PERCENT: 0.4 %
EOSINOPHILS ABSOLUTE COUNT: 0 10*9/L (ref 0.0–0.5)
EOSINOPHILS RELATIVE PERCENT: 0.6 %
HEMATOCRIT: 32.7 % — ABNORMAL LOW (ref 39.0–48.0)
HEMOGLOBIN: 11.3 g/dL — ABNORMAL LOW (ref 12.9–16.5)
LYMPHOCYTES ABSOLUTE COUNT: 0.5 10*9/L — ABNORMAL LOW (ref 1.1–3.6)
LYMPHOCYTES RELATIVE PERCENT: 12 %
MEAN CORPUSCULAR HEMOGLOBIN CONC: 34.5 g/dL (ref 32.0–36.0)
MEAN CORPUSCULAR HEMOGLOBIN: 31.3 pg (ref 25.9–32.4)
MEAN CORPUSCULAR VOLUME: 90.6 fL (ref 77.6–95.7)
MEAN PLATELET VOLUME: 6.9 fL (ref 6.8–10.7)
MONOCYTES ABSOLUTE COUNT: 0.5 10*9/L (ref 0.3–0.8)
MONOCYTES RELATIVE PERCENT: 11.2 %
NEUTROPHILS ABSOLUTE COUNT: 3.2 10*9/L (ref 1.8–7.8)
NEUTROPHILS RELATIVE PERCENT: 75.8 %
NUCLEATED RED BLOOD CELLS: 0 /100{WBCs} (ref <=4)
PLATELET COUNT: 224 10*9/L (ref 150–450)
RED BLOOD CELL COUNT: 3.61 10*12/L — ABNORMAL LOW (ref 4.26–5.60)
RED CELL DISTRIBUTION WIDTH: 14.7 % (ref 12.2–15.2)
WBC ADJUSTED: 4.3 10*9/L (ref 3.6–11.2)

## 2022-08-24 LAB — PHOSPHORUS: PHOSPHORUS: 2.9 mg/dL (ref 2.4–5.1)

## 2022-08-24 LAB — MAGNESIUM: MAGNESIUM: 1.8 mg/dL (ref 1.6–2.6)

## 2022-08-24 LAB — SLIDE REVIEW

## 2022-08-27 DIAGNOSIS — Z940 Kidney transplant status: Principal | ICD-10-CM

## 2022-08-27 MED ORDER — ALPRAZOLAM 0.5 MG TABLET
ORAL_TABLET | Freq: Two times a day (BID) | ORAL | 0 refills | 5 days | Status: CP | PRN
Start: 2022-08-27 — End: ?

## 2022-08-27 NOTE — Unmapped (Signed)
Called patient regarding upcoming appointment with Dr Toni Arthurs.  Made patient aware that we will need to reschedule appointment as it will be less than 21 days from positive COVID diagnosis.  Patient verbalized understanding and schedulers messaged.      Pentamidine rescheduled for 4/3 as well.    Patient mentioned to TNC recent chest pain, starting on left side, moving to the right and radiating toward his back, lasting 15 minutes and resolving completely.  He notes it was after missing 3 days Omeprazole.  TNC advised him to let PCP know about this and to reach out to someone if it happens again now that he has restarted Omeprazole.  Per Dr Toni Arthurs, if reoccurrence while on medication, may need to get stress ECHO.

## 2022-08-27 NOTE — Unmapped (Signed)
Patient is requesting the following refill  Requested Prescriptions     Pending Prescriptions Disp Refills    ALPRAZolam (XANAX) 0.5 MG tablet 10 tablet 0     Sig: Take 1 tablet (0.5 mg total) by mouth two (2) times a day as needed for anxiety.       Order pended. Please advise. Thanks    Last OV: 07/04/2022   Next OV: Visit date not found

## 2022-09-03 DIAGNOSIS — D849 Immunodeficiency, unspecified: Principal | ICD-10-CM

## 2022-09-03 DIAGNOSIS — Z940 Kidney transplant status: Principal | ICD-10-CM

## 2022-09-03 DIAGNOSIS — D72819 Decreased white blood cell count, unspecified: Principal | ICD-10-CM

## 2022-09-05 ENCOUNTER — Institutional Professional Consult (permissible substitution): Admit: 2022-09-05 | Discharge: 2022-09-06 | Payer: PRIVATE HEALTH INSURANCE

## 2022-09-05 ENCOUNTER — Encounter
Admit: 2022-09-05 | Discharge: 2022-09-06 | Payer: PRIVATE HEALTH INSURANCE | Attending: Nephrology | Primary: Nephrology

## 2022-09-05 ENCOUNTER — Ambulatory Visit: Admit: 2022-09-05 | Discharge: 2022-09-06 | Payer: PRIVATE HEALTH INSURANCE

## 2022-09-05 LAB — MAGNESIUM: MAGNESIUM: 1.9 mg/dL (ref 1.6–2.6)

## 2022-09-05 LAB — COMPREHENSIVE METABOLIC PANEL
ALBUMIN: 3.5 g/dL (ref 3.4–5.0)
ALKALINE PHOSPHATASE: 67 U/L (ref 46–116)
ALT (SGPT): 21 U/L (ref 10–49)
ANION GAP: 9 mmol/L (ref 5–14)
AST (SGOT): 15 U/L (ref <=34)
BILIRUBIN TOTAL: 0.3 mg/dL (ref 0.3–1.2)
BLOOD UREA NITROGEN: 24 mg/dL — ABNORMAL HIGH (ref 9–23)
BUN / CREAT RATIO: 12
CALCIUM: 9.6 mg/dL (ref 8.7–10.4)
CHLORIDE: 106 mmol/L (ref 98–107)
CO2: 25 mmol/L (ref 20.0–31.0)
CREATININE: 1.94 mg/dL — ABNORMAL HIGH
EGFR CKD-EPI (2021) MALE: 45 mL/min/{1.73_m2} — ABNORMAL LOW (ref >=60)
GLUCOSE RANDOM: 90 mg/dL (ref 70–99)
POTASSIUM: 3.4 mmol/L (ref 3.4–4.8)
PROTEIN TOTAL: 6.6 g/dL (ref 5.7–8.2)
SODIUM: 140 mmol/L (ref 135–145)

## 2022-09-05 LAB — URINALYSIS WITH MICROSCOPY
BACTERIA: NONE SEEN /HPF
BILIRUBIN UA: NEGATIVE
GLUCOSE UA: NEGATIVE
KETONES UA: NEGATIVE
LEUKOCYTE ESTERASE UA: NEGATIVE
NITRITE UA: NEGATIVE
PH UA: 6 (ref 5.0–9.0)
PROTEIN UA: 70 — AB
RBC UA: 16 /HPF — ABNORMAL HIGH (ref <=3)
SPECIFIC GRAVITY UA: 1.014 (ref 1.003–1.030)
SQUAMOUS EPITHELIAL: <1 /HPF (ref 0–5)
UROBILINOGEN UA: <2
WBC UA: 2 /HPF (ref <=2)

## 2022-09-05 LAB — LIPID PANEL
CHOLESTEROL/HDL RATIO SCREEN: 3.1 (ref 1.0–4.5)
CHOLESTEROL: 235 mg/dL — ABNORMAL HIGH (ref <=200)
HDL CHOLESTEROL: 77 mg/dL — ABNORMAL HIGH (ref 40–60)
LDL CHOLESTEROL CALCULATED: 122 mg/dL — ABNORMAL HIGH (ref 40–99)
NON-HDL CHOLESTEROL: 158 mg/dL — ABNORMAL HIGH (ref 70–130)
TRIGLYCERIDES: 178 mg/dL — ABNORMAL HIGH (ref 0–150)
VLDL CHOLESTEROL CAL: 35.6 mg/dL (ref 10–50)

## 2022-09-05 LAB — PHOSPHORUS: PHOSPHORUS: 4.1 mg/dL (ref 2.4–5.1)

## 2022-09-05 LAB — CBC W/ AUTO DIFF
BASOPHILS ABSOLUTE COUNT: 0 10*9/L (ref 0.0–0.1)
BASOPHILS RELATIVE PERCENT: 0.3 %
EOSINOPHILS ABSOLUTE COUNT: 0 10*9/L (ref 0.0–0.5)
EOSINOPHILS RELATIVE PERCENT: 1.7 %
HEMATOCRIT: 30.3 % — ABNORMAL LOW (ref 39.0–48.0)
HEMOGLOBIN: 10.6 g/dL — ABNORMAL LOW (ref 12.9–16.5)
LYMPHOCYTES ABSOLUTE COUNT: 0.8 10*9/L — ABNORMAL LOW (ref 1.1–3.6)
LYMPHOCYTES RELATIVE PERCENT: 45.2 %
MEAN CORPUSCULAR HEMOGLOBIN CONC: 35 g/dL (ref 32.0–36.0)
MEAN CORPUSCULAR HEMOGLOBIN: 31.5 pg (ref 25.9–32.4)
MEAN CORPUSCULAR VOLUME: 90 fL (ref 77.6–95.7)
MEAN PLATELET VOLUME: 6.9 fL (ref 6.8–10.7)
MONOCYTES ABSOLUTE COUNT: 0.3 10*9/L (ref 0.3–0.8)
MONOCYTES RELATIVE PERCENT: 17 %
NEUTROPHILS ABSOLUTE COUNT: 0.6 10*9/L — ABNORMAL LOW (ref 1.8–7.8)
NEUTROPHILS RELATIVE PERCENT: 35.8 %
PLATELET COUNT: 220 10*9/L (ref 150–450)
RED BLOOD CELL COUNT: 3.37 10*12/L — ABNORMAL LOW (ref 4.26–5.60)
RED CELL DISTRIBUTION WIDTH: 16.2 % — ABNORMAL HIGH (ref 12.2–15.2)
WBC ADJUSTED: 1.8 10*9/L — ABNORMAL LOW (ref 3.6–11.2)

## 2022-09-05 LAB — PROTEIN / CREATININE RATIO, URINE
CREATININE, URINE: 121.5 mg/dL
PROTEIN URINE: 88.6 mg/dL
PROTEIN/CREAT RATIO, URINE: 0.729

## 2022-09-05 LAB — CMV DNA, QUANTITATIVE, PCR: CMV VIRAL LD: NOT DETECTED

## 2022-09-05 LAB — SLIDE REVIEW

## 2022-09-05 LAB — BK VIRUS QUANTITATIVE PCR, BLOOD: BK BLOOD RESULT: NOT DETECTED

## 2022-09-05 LAB — BILIRUBIN, DIRECT: BILIRUBIN DIRECT: <0.1 mg/dL (ref 0.00–0.30)

## 2022-09-05 MED ADMIN — belatacept (NULOJIX) 450 mg in sodium chloride (NS) 0.9 % 100 mL IVPB: 5 mg/kg | INTRAVENOUS | @ 13:00:00 | Stop: 2022-09-05

## 2022-09-05 MED ADMIN — pentamidine (PENTAM) inhalation solution: 300 mg | RESPIRATORY_TRACT | @ 14:00:00 | Stop: 2022-09-05

## 2022-09-05 MED ADMIN — albuterol 2.5 mg /3 mL (0.083 %) nebulizer solution 2.5 mg: 2.5 mg | RESPIRATORY_TRACT | @ 14:00:00 | Stop: 2022-09-05

## 2022-09-05 NOTE — Unmapped (Signed)
1015 Albuterol nebulizer treatment started followed by Pentamidine nebulizer.   The patient tolerated the procedures well and departed the clinic ambulatory in stable condition.

## 2022-09-05 NOTE — Unmapped (Signed)
1610 Patient arrived to the Transplant Infusion Room today for belatacept 450 mg, Condition: well; Mobility: ambulating; accompanied by self.   See Flowsheet and MAR for all details of visit.   0916 VS stable.  9604 PIV placed and secured with coban; labs collected and sent, urine collected and sent.  5409 Infusion initiated. Patient educated to notify nursing staff if they experience urticaria, erythema, nausea, shortness of breath, nausea, metal taste in mouth, excessive oral or postnasal drainage and any other changes in status which could be side effects from initiating this medication.  1001 Infusion complete.   1011 VS stable, Line flushed with NS, PIV removed and secured with coban. Patient left clinic today, Condition: well; Mobility: ambulating; accompanied by self.

## 2022-09-05 NOTE — Unmapped (Signed)
Error - duplicate

## 2022-09-10 DIAGNOSIS — Z940 Kidney transplant status: Principal | ICD-10-CM

## 2022-09-10 LAB — HLA DS POST TRANSPLANT
ANTI-DONOR DRW #1 MFI: 0 MFI
ANTI-DONOR DRW #2 MFI: 0 MFI
ANTI-DONOR HLA-A #1 MFI: 12 MFI
ANTI-DONOR HLA-A #2 MFI: 0 MFI
ANTI-DONOR HLA-B #1 MFI: 0 MFI
ANTI-DONOR HLA-B #2 MFI: 0 MFI
ANTI-DONOR HLA-C #2 MFI: 0 MFI
ANTI-DONOR HLA-DP AG #1 MFI: 10 MFI
ANTI-DONOR HLA-DQB #1 MFI: 16 MFI
ANTI-DONOR HLA-DQB #2 MFI: 0 MFI
ANTI-DONOR HLA-DR #1 MFI: 22 MFI

## 2022-09-10 LAB — FSAB CLASS 1 ANTIBODY SPECIFICITY: HLA CLASS 1 ANTIBODY RESULT: NEGATIVE

## 2022-09-10 LAB — FSAB CLASS 2 ANTIBODY SPECIFICITY: HLA CL2 AB RESULT: NEGATIVE

## 2022-09-12 ENCOUNTER — Ambulatory Visit
Admit: 2022-09-12 | Discharge: 2022-09-13 | Payer: PRIVATE HEALTH INSURANCE | Attending: Nephrology | Primary: Nephrology

## 2022-09-12 DIAGNOSIS — Z940 Kidney transplant status: Principal | ICD-10-CM

## 2022-09-12 LAB — URINALYSIS WITH MICROSCOPY
BILIRUBIN UA: NEGATIVE
GLUCOSE UA: NEGATIVE
KETONES UA: NEGATIVE
LEUKOCYTE ESTERASE UA: NEGATIVE
NITRITE UA: NEGATIVE
PH UA: 6.5 (ref 5.0–9.0)
PROTEIN UA: 100 — AB
RBC UA: 20 /HPF — ABNORMAL HIGH (ref <3)
SPECIFIC GRAVITY UA: 1.015 (ref 1.005–1.030)
SQUAMOUS EPITHELIAL: <1 /HPF (ref 0–5)
UROBILINOGEN UA: 0.2
WBC UA: <1 /HPF (ref <2)

## 2022-09-12 LAB — PROTEIN / CREATININE RATIO, URINE
CREATININE, URINE: 59.3 mg/dL
PROTEIN URINE: 55.1 mg/dL
PROTEIN/CREAT RATIO, URINE: 0.929

## 2022-09-12 NOTE — Unmapped (Signed)
Transplant Coordinator, Clinic Visit   Pt seen today by transplant nephrology for follow up, reviewed medications and symptoms.          09/12/22 1005   BP: 128/83   Pulse: 82   Temp: 36.1 C (97 F)   Weight: 89.4 kg (197 lb)   PainSc: 0-No pain       Assessment  BP: reports systolic 120's at home  Lightheaded: denies  Headache: denies  Hand tremors: none  Numbness/tingling: none  Fevers: no  Chills/sweats: no  Shortness of breath: no  Chest pain or pressure: no  Palpitations: no  Abdominal pain: no  Heart burn: no, taking Prilosec daily  Nausea/vomiting: denies  Diarrhea/constipation: none  UTI symptoms: none  Swelling: none  Sleep: sleeps ok  Pain: no pain      Good appetite; reports adequate hydration.     Intake: reports adequate  Output: reports adequate    Any new medications? none  Immunosuppressant last taken: Belatacept monthly, Prednisone taper    Immunization status: up to date    Functional Score: 100   Normal no complaints; no evidence of  disease.   Employment status is: works full time    I spent a total of 20 minutes with Ryan Moon reviewing medications and symptoms.

## 2022-09-21 NOTE — Unmapped (Signed)
[facility] transplant nephrology clinic visit    assessment and plan  1. 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 baseline creatinine 1.5-2 mg/dl. stage iiia chr kidney disease. no donor specific hla ab detected '23.  2. immunosuppression. belatacept 5mg /kg q.month. mycophenolate on hold d/t leukopenia. prednisone taper 10mg  daily x2 wks then 5mg  daily.   3. mpo-anca+ recurrent gn. iv methylprednisolone; prednisone taper. rituximab 1g x2 1-2/24. potential transition to ofatumumab for next infusion 7/24 d/t serum sickness like reaction with previous infusions.  4. hypertension. blood pressure goal <130/80 mmhg.  5. preventive medicine. influenza '21. pcv13 pnuemococcal '19. ppsv23 pneumococcal '19. covid-19 vaccine refused.    history of present illness    Ryan Moon is a 37 year old gentleman seen in follow up post kidney transplant 04/28/2019.     past medical hx:  1. 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 alemtuzumab induction. baseline creatinine 1.5-1.8 mg/dl.   > kidney bx 01/21: no acute/chr rejection.  > kidney bx 01/24: mpo-anca+ pauci immune gn  2. hypertension  3. anxiety/depression    allergies: no known drug allergies    medications: belatacept 5mg /kg q.month, prednisone 60mg  daily, amlodipine 5mg  pm, mg oxide 133mg  daily, omeprazole 20mg  daily, alprazolam 0.5mg  prn.    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    physical exam: t98 p72 bp123/83 wt81.6kg bmi 24.4. wd/wn gentleman appropriate affect and mood. sclera anicteric. mmm no thrush. neck supple no palpable ln. heart rrr nl s1s2 no m/r/g. lungs clear bilateral. abd soft nt/nd. no lower ext edema. msk no synovitis/tophi. skin no rash. neuro alert oriented non focal exam.

## 2022-09-26 ENCOUNTER — Institutional Professional Consult (permissible substitution): Admit: 2022-09-26 | Discharge: 2022-09-27 | Payer: PRIVATE HEALTH INSURANCE

## 2022-09-26 MED ADMIN — pentamidine (PENTAM) inhalation solution: 300 mg | RESPIRATORY_TRACT | @ 15:00:00 | Stop: 2022-09-26

## 2022-09-26 MED ADMIN — albuterol 2.5 mg /3 mL (0.083 %) nebulizer solution 2.5 mg: 2.5 mg | RESPIRATORY_TRACT | @ 15:00:00 | Stop: 2022-09-26

## 2022-09-26 NOTE — Unmapped (Signed)
Per providers orders, Albuterol and Pentamidine treatments were administered.  Patient tolerated it well with no complication.  See MAR for administration info.

## 2022-10-02 MED ORDER — ALPRAZOLAM 0.5 MG TABLET
ORAL_TABLET | Freq: Two times a day (BID) | ORAL | 0 refills | 5 days | Status: CP | PRN
Start: 2022-10-02 — End: ?

## 2022-10-02 NOTE — Unmapped (Signed)
Patient is requesting the following refill  Requested Prescriptions     Pending Prescriptions Disp Refills    ALPRAZolam (XANAX) 0.5 MG tablet 10 tablet 0     Sig: Take 1 tablet (0.5 mg total) by mouth two (2) times a day as needed for anxiety.       Order pended. Please advise. Thanks    Last OV: 07/04/2022   Next OV: Visit date not found

## 2022-10-03 DIAGNOSIS — Z940 Kidney transplant status: Principal | ICD-10-CM

## 2022-10-03 DIAGNOSIS — D849 Immunodeficiency, unspecified: Principal | ICD-10-CM

## 2022-10-03 DIAGNOSIS — D72819 Decreased white blood cell count, unspecified: Principal | ICD-10-CM

## 2022-10-10 ENCOUNTER — Ambulatory Visit: Admit: 2022-10-10 | Discharge: 2022-10-10 | Payer: PRIVATE HEALTH INSURANCE

## 2022-10-10 ENCOUNTER — Encounter
Admit: 2022-10-10 | Discharge: 2022-10-10 | Payer: PRIVATE HEALTH INSURANCE | Attending: Nephrology | Primary: Nephrology

## 2022-10-10 LAB — LIPID PANEL
CHOLESTEROL/HDL RATIO SCREEN: 2.9 (ref 1.0–4.5)
CHOLESTEROL: 243 mg/dL — ABNORMAL HIGH (ref <=200)
HDL CHOLESTEROL: 84 mg/dL — ABNORMAL HIGH (ref 40–60)
LDL CHOLESTEROL CALCULATED: 137 mg/dL — ABNORMAL HIGH (ref 40–99)
NON-HDL CHOLESTEROL: 159 mg/dL — ABNORMAL HIGH (ref 70–130)
TRIGLYCERIDES: 108 mg/dL (ref 0–150)
VLDL CHOLESTEROL CAL: 21.6 mg/dL (ref 10–50)

## 2022-10-10 LAB — COMPREHENSIVE METABOLIC PANEL
ALBUMIN: 4.1 g/dL (ref 3.4–5.0)
ALKALINE PHOSPHATASE: 71 U/L (ref 46–116)
ALT (SGPT): 21 U/L (ref 10–49)
ANION GAP: 9 mmol/L (ref 5–14)
AST (SGOT): 22 U/L (ref <=34)
BILIRUBIN TOTAL: 0.4 mg/dL (ref 0.3–1.2)
BLOOD UREA NITROGEN: 21 mg/dL (ref 9–23)
BUN / CREAT RATIO: 12
CALCIUM: 9.9 mg/dL (ref 8.7–10.4)
CHLORIDE: 101 mmol/L (ref 98–107)
CO2: 26 mmol/L (ref 20.0–31.0)
CREATININE: 1.69 mg/dL — ABNORMAL HIGH
EGFR CKD-EPI (2021) MALE: 53 mL/min/{1.73_m2} — ABNORMAL LOW (ref >=60)
GLUCOSE RANDOM: 75 mg/dL (ref 70–99)
POTASSIUM: 4 mmol/L (ref 3.4–4.8)
PROTEIN TOTAL: 7.6 g/dL (ref 5.7–8.2)
SODIUM: 136 mmol/L (ref 135–145)

## 2022-10-10 LAB — CBC W/ AUTO DIFF
BASOPHILS ABSOLUTE COUNT: 0 10*9/L (ref 0.0–0.1)
BASOPHILS RELATIVE PERCENT: 0.9 %
EOSINOPHILS ABSOLUTE COUNT: 0 10*9/L (ref 0.0–0.5)
EOSINOPHILS RELATIVE PERCENT: 1.9 %
HEMATOCRIT: 35.3 % — ABNORMAL LOW (ref 39.0–48.0)
HEMOGLOBIN: 12.3 g/dL — ABNORMAL LOW (ref 12.9–16.5)
LYMPHOCYTES ABSOLUTE COUNT: 0.6 10*9/L — ABNORMAL LOW (ref 1.1–3.6)
LYMPHOCYTES RELATIVE PERCENT: 28.8 %
MEAN CORPUSCULAR HEMOGLOBIN CONC: 34.7 g/dL (ref 32.0–36.0)
MEAN CORPUSCULAR HEMOGLOBIN: 31.5 pg (ref 25.9–32.4)
MEAN CORPUSCULAR VOLUME: 90.8 fL (ref 77.6–95.7)
MEAN PLATELET VOLUME: 7.3 fL (ref 6.8–10.7)
MONOCYTES ABSOLUTE COUNT: 0.4 10*9/L (ref 0.3–0.8)
MONOCYTES RELATIVE PERCENT: 19.9 %
NEUTROPHILS ABSOLUTE COUNT: 1.1 10*9/L — ABNORMAL LOW (ref 1.8–7.8)
NEUTROPHILS RELATIVE PERCENT: 48.5 %
PLATELET COUNT: 274 10*9/L (ref 150–450)
RED BLOOD CELL COUNT: 3.89 10*12/L — ABNORMAL LOW (ref 4.26–5.60)
RED CELL DISTRIBUTION WIDTH: 17.3 % — ABNORMAL HIGH (ref 12.2–15.2)
WBC ADJUSTED: 2.3 10*9/L — ABNORMAL LOW (ref 3.6–11.2)

## 2022-10-10 LAB — PHOSPHORUS: PHOSPHORUS: 3.2 mg/dL (ref 2.4–5.1)

## 2022-10-10 LAB — SLIDE REVIEW

## 2022-10-10 LAB — BILIRUBIN, DIRECT: BILIRUBIN DIRECT: <0.1 mg/dL (ref 0.00–0.30)

## 2022-10-10 LAB — MAGNESIUM: MAGNESIUM: 2 mg/dL (ref 1.6–2.6)

## 2022-10-10 MED ADMIN — belatacept (NULOJIX) 462.5 mg in sodium chloride (NS) 0.9 % 100 mL IVPB: 5 mg/kg | INTRAVENOUS | @ 13:00:00 | Stop: 2022-10-10

## 2022-10-10 NOTE — Unmapped (Signed)
5366 Patient arrived to the Transplant Infusion Room today for belatacept 462.5mg , Condition: well; Mobility: ambulating; accompanied by self.   See Flowsheet and MAR for all details of visit.   0913 VS stable.  4403 PIV placed and secured with coban; labs collected and sent, urine no orders found.  4742 Infusion initiated. Patient educated to notify nursing staff if they experience urticaria, erythema, nausea, shortness of breath, nausea, metal taste in mouth, excessive oral or postnasal drainage and any other changes in status which could be side effects from initiating this medication.  1003 Infusion complete.   1003 VS stable, Line flushed with NS, PIV removed and secured with coban. Patient left clinic today, Condition: well; Mobility: ambulating; accompanied by self.

## 2022-10-11 LAB — CMV DNA, QUANTITATIVE, PCR: CMV VIRAL LD: NOT DETECTED

## 2022-10-11 LAB — BK VIRUS QUANTITATIVE PCR, BLOOD: BK BLOOD RESULT: NOT DETECTED

## 2022-10-16 LAB — HLA DS POST TRANSPLANT
ANTI-DONOR DRW #1 MFI: 0 MFI
ANTI-DONOR DRW #2 MFI: 0 MFI
ANTI-DONOR HLA-A #1 MFI: 12 MFI
ANTI-DONOR HLA-A #2 MFI: 20 MFI
ANTI-DONOR HLA-B #1 MFI: 0 MFI
ANTI-DONOR HLA-B #2 MFI: 0 MFI
ANTI-DONOR HLA-C #2 MFI: 0 MFI
ANTI-DONOR HLA-DP AG #1 MFI: 8 MFI
ANTI-DONOR HLA-DQB #1 MFI: 7 MFI
ANTI-DONOR HLA-DQB #2 MFI: 0 MFI
ANTI-DONOR HLA-DR #1 MFI: 21 MFI
ANTI-DONOR HLA-DR #2 MFI: 23 MFI

## 2022-10-16 LAB — FSAB CLASS 2 ANTIBODY SPECIFICITY: HLA CL2 AB RESULT: NEGATIVE

## 2022-10-16 LAB — FSAB CLASS 1 ANTIBODY SPECIFICITY: HLA CLASS 1 ANTIBODY RESULT: NEGATIVE

## 2022-10-24 NOTE — Unmapped (Signed)
[facility] Refill Coordination Note    Specialty Medication(s) to be Shipped:   Transplant: Prednisone 5mg     Other medication(s) to be shipped: omeprazole 20 MG capsule (PriLOSEC), losartan 50 MG tablet (COZAAR)     Ryan Moon, DOB: 1985/12/28  Phone: [phone] (home) [phone] (work)      All above HIPAA information was verified with patient.     Was a Nurse, learning disability used for this call? No    Completed refill call assessment today to schedule patient's medication shipment from the [facility] [phone]).  All relevant notes have been reviewed.     Specialty medication(s) and dose(s) confirmed: Regimen is correct and unchanged.   Changes to medications: Jabri reports no changes at this time.  Changes to insurance: No  New side effects reported not previously addressed with a pharmacist or physician: None reported  Questions for the pharmacist: No    Confirmed patient received a Conservation officer, historic buildings and a Surveyor, mining with first shipment. The patient will receive a drug information handout for each medication shipped and additional FDA Medication Guides as required.       DISEASE/MEDICATION-SPECIFIC INFORMATION        N/A    SPECIALTY MEDICATION ADHERENCE     Medication Adherence    Patient reported X missed doses in the last month: 0  Specialty Medication: predniSONE 20 MG tablet (DELTASONE)  Patient is on additional specialty medications: No  Patient is on more than two specialty medications: No  Any gaps in refill history greater than 2 weeks in the last 3 months: no  Demonstrates understanding of importance of adherence: yes              Were doses missed due to medication being on hold? No    predniSONE 5   mg: 4 days of medicine on hand       REFERRAL TO PHARMACIST     Referral to the pharmacist: Not needed      [facility]     Shipping address confirmed in Epic.       Delivery Scheduled: Yes, Expected medication delivery date: 10/26/22.     Medication will be delivered via Same Day Courier to the prescription address in Epic WAM.    Moshe Salisbury   [facility] Specialty Technician

## 2022-10-26 MED FILL — PREDNISONE 5 MG TABLET: ORAL | 90 days supply | Qty: 90 | Fill #0

## 2022-10-26 MED FILL — OMEPRAZOLE 20 MG CAPSULE,DELAYED RELEASE: ORAL | 30 days supply | Qty: 30 | Fill #1

## 2022-11-02 MED ORDER — ALPRAZOLAM 0.5 MG TABLET
ORAL_TABLET | Freq: Two times a day (BID) | ORAL | 0 refills | 5 days | Status: CP | PRN
Start: 2022-11-02 — End: ?

## 2022-11-02 NOTE — Unmapped (Signed)
Patient is requesting the following refill  Requested Prescriptions     Pending Prescriptions Disp Refills    ALPRAZolam (XANAX) 0.5 MG tablet 10 tablet 0     Sig: Take 1 tablet (0.5 mg total) by mouth two (2) times a day as needed for anxiety.       Order pended. Please advise. Thanks    Last OV: 07/04/2022   Next OV: Visit date not found

## 2022-11-07 ENCOUNTER — Ambulatory Visit: Admit: 2022-11-07 | Discharge: 2022-11-08 | Payer: PRIVATE HEALTH INSURANCE

## 2022-11-07 LAB — CBC W/ AUTO DIFF
BASOPHILS ABSOLUTE COUNT: 0 10*9/L (ref 0.0–0.1)
BASOPHILS RELATIVE PERCENT: 0.8 %
EOSINOPHILS ABSOLUTE COUNT: 0 10*9/L (ref 0.0–0.5)
EOSINOPHILS RELATIVE PERCENT: 1.8 %
HEMATOCRIT: 34.5 % — ABNORMAL LOW (ref 39.0–48.0)
HEMOGLOBIN: 12 g/dL — ABNORMAL LOW (ref 12.9–16.5)
LYMPHOCYTES ABSOLUTE COUNT: 0.4 10*9/L — ABNORMAL LOW (ref 1.1–3.6)
LYMPHOCYTES RELATIVE PERCENT: 23.3 %
MEAN CORPUSCULAR HEMOGLOBIN CONC: 34.7 g/dL (ref 32.0–36.0)
MEAN CORPUSCULAR HEMOGLOBIN: 32.3 pg (ref 25.9–32.4)
MEAN CORPUSCULAR VOLUME: 92.9 fL (ref 77.6–95.7)
MEAN PLATELET VOLUME: 7.1 fL (ref 6.8–10.7)
MONOCYTES ABSOLUTE COUNT: 0.3 10*9/L (ref 0.3–0.8)
MONOCYTES RELATIVE PERCENT: 19 %
NEUTROPHILS ABSOLUTE COUNT: 1 10*9/L — ABNORMAL LOW (ref 1.8–7.8)
NEUTROPHILS RELATIVE PERCENT: 55.1 %
PLATELET COUNT: 197 10*9/L (ref 150–450)
RED BLOOD CELL COUNT: 3.71 10*12/L — ABNORMAL LOW (ref 4.26–5.60)
RED CELL DISTRIBUTION WIDTH: 16.6 % — ABNORMAL HIGH (ref 12.2–15.2)
WBC ADJUSTED: 1.8 10*9/L — ABNORMAL LOW (ref 3.6–11.2)

## 2022-11-07 LAB — BASIC METABOLIC PANEL
ANION GAP: 8 mmol/L (ref 5–14)
BLOOD UREA NITROGEN: 23 mg/dL (ref 9–23)
BUN / CREAT RATIO: 12
CALCIUM: 9.5 mg/dL (ref 8.7–10.4)
CHLORIDE: 106 mmol/L (ref 98–107)
CO2: 27 mmol/L (ref 20.0–31.0)
CREATININE: 1.87 mg/dL — ABNORMAL HIGH
EGFR CKD-EPI (2021) MALE: 47 mL/min/{1.73_m2} — ABNORMAL LOW (ref >=60)
GLUCOSE RANDOM: 93 mg/dL (ref 70–179)
POTASSIUM: 3.8 mmol/L (ref 3.4–4.8)
SODIUM: 141 mmol/L (ref 135–145)

## 2022-11-07 LAB — SLIDE REVIEW

## 2022-11-07 LAB — MAGNESIUM: MAGNESIUM: 1.7 mg/dL (ref 1.6–2.6)

## 2022-11-07 LAB — PHOSPHORUS: PHOSPHORUS: 2.4 mg/dL (ref 2.4–5.1)

## 2022-11-07 NOTE — Unmapped (Signed)
1610 Patient arrived to the Transplant Infusion Room today for belatacept 462.5mg , Condition: well; Mobility: ambulating; accompanied by self.   See Flowsheet and MAR for all details of visit.   0953 VS stable. Obtained by Algis Greenhouse, RN.  [phone] PIV placed and secured with coban; labs collected and sent, urine collected and sent.  1019 Infusion initiated. Patient educated to notify nursing staff if they experience urticaria, erythema, nausea, shortness of breath, nausea, metal taste in mouth, excessive oral or postnasal drainage and any other changes in status which could be side effects from initiating this medication.  1100 Infusion complete.   1101 VS stable, Line flushed with NS, PIV removed and secured with coban. Patient left clinic today, Condition: well; Mobility: ambulating; accompanied by self.

## 2022-11-09 NOTE — Unmapped (Signed)
Complex Case Management  SUMMARY NOTE    High Risk Care Coordinator  spoke with patient and verified correct patient using two identifiers today to introduce the Complex Case Management program.     Discussed the following:  Program Services, Expectations of participation, and Verified Demographics    Program status: Interested    Discuss at next visit: Complete Pre-Assessment    Scheduled for: 11/12/22 at 12:00pm with High Risk Care Coordinator       Deliah Goody - High Risk Care Coordinator   [facility]  [address], [address]  [place], [place] [address]  P: [phone] F: [phone]  [email or web address]

## 2022-11-14 NOTE — Unmapped (Signed)
Complex Case Management  SUMMARY NOTE    High Risk Care Coordinator  spoke with patient and verified correct patient using two identifiers today to introduce the Complex Case Management program.     Discussed the following:  Program Services and Expectations of participation    Program status: Interested    Discuss at next visit: Complete Pre-Assessment    Scheduled for: 11/16/22 at 12:00pm with High Risk Care Coordinator       Deliah Goody - High Risk Care Coordinator   [facility]  [address]  [place], [place] [address]  P: [phone] F: [phone]  [email or web address]

## 2022-11-27 DIAGNOSIS — Z940 Kidney transplant status: Principal | ICD-10-CM

## 2022-11-27 DIAGNOSIS — D849 Immunodeficiency, unspecified: Principal | ICD-10-CM

## 2022-11-27 MED ORDER — MG-PLUS-PROTEIN 133 MG TABLET
ORAL_TABLET | Freq: Every day | ORAL | 3 refills | 90 days
Start: 2022-11-27 — End: 2023-11-27

## 2022-12-03 DIAGNOSIS — Z940 Kidney transplant status: Principal | ICD-10-CM

## 2022-12-03 DIAGNOSIS — D72819 Decreased white blood cell count, unspecified: Principal | ICD-10-CM

## 2022-12-03 DIAGNOSIS — D849 Immunodeficiency, unspecified: Principal | ICD-10-CM

## 2022-12-12 MED ORDER — ALPRAZOLAM 0.5 MG TABLET
ORAL_TABLET | Freq: Two times a day (BID) | ORAL | 0 refills | 5 days | Status: CP | PRN
Start: 2022-12-12 — End: ?

## 2022-12-12 NOTE — Unmapped (Signed)
Patient is requesting the following refill  Requested Prescriptions     Pending Prescriptions Disp Refills    ALPRAZolam (XANAX) 0.5 MG tablet 10 tablet 0     Sig: Take 1 tablet (0.5 mg total) by mouth two (2) times a day as needed for anxiety.       Order pended. Please advise. Thanks    Last OV: 07/04/2022   Next OV: Visit date not found

## 2022-12-13 ENCOUNTER — Ambulatory Visit: Admit: 2022-12-13 | Discharge: 2022-12-14 | Payer: PRIVATE HEALTH INSURANCE

## 2022-12-13 LAB — CBC W/ AUTO DIFF
BASOPHILS ABSOLUTE COUNT: 0 10*9/L (ref 0.0–0.1)
BASOPHILS RELATIVE PERCENT: 0.8 %
EOSINOPHILS ABSOLUTE COUNT: 0.1 10*9/L (ref 0.0–0.5)
EOSINOPHILS RELATIVE PERCENT: 4.3 %
HEMATOCRIT: 36 % — ABNORMAL LOW (ref 39.0–48.0)
HEMOGLOBIN: 12.7 g/dL — ABNORMAL LOW (ref 12.9–16.5)
LYMPHOCYTES ABSOLUTE COUNT: 0.5 10*9/L — ABNORMAL LOW (ref 1.1–3.6)
LYMPHOCYTES RELATIVE PERCENT: 30.7 %
MEAN CORPUSCULAR HEMOGLOBIN CONC: 35.4 g/dL (ref 32.0–36.0)
MEAN CORPUSCULAR HEMOGLOBIN: 32.3 pg (ref 25.9–32.4)
MEAN CORPUSCULAR VOLUME: 91.2 fL (ref 77.6–95.7)
MEAN PLATELET VOLUME: 7.4 fL (ref 6.8–10.7)
MONOCYTES ABSOLUTE COUNT: 0.4 10*9/L (ref 0.3–0.8)
MONOCYTES RELATIVE PERCENT: 20.9 %
NEUTROPHILS ABSOLUTE COUNT: 0.8 10*9/L — ABNORMAL LOW (ref 1.8–7.8)
NEUTROPHILS RELATIVE PERCENT: 43.3 %
PLATELET COUNT: 227 10*9/L (ref 150–450)
RED BLOOD CELL COUNT: 3.95 10*12/L — ABNORMAL LOW (ref 4.26–5.60)
RED CELL DISTRIBUTION WIDTH: 14.3 % (ref 12.2–15.2)
WBC ADJUSTED: 1.7 10*9/L — ABNORMAL LOW (ref 3.6–11.2)

## 2022-12-13 LAB — URINALYSIS WITH MICROSCOPY WITH CULTURE REFLEX PERFORMABLE
BILIRUBIN UA: NEGATIVE
GLUCOSE UA: NEGATIVE
KETONES UA: NEGATIVE
LEUKOCYTE ESTERASE UA: NEGATIVE
NITRITE UA: NEGATIVE
PH UA: 5.5 (ref 5.0–9.0)
RBC UA: 1 /HPF (ref <=3)
SPECIFIC GRAVITY UA: 1.003 (ref 1.003–1.030)
SQUAMOUS EPITHELIAL: <1 /HPF (ref 0–5)
UROBILINOGEN UA: <2
WBC UA: <1 /HPF (ref <=2)

## 2022-12-13 LAB — SLIDE REVIEW

## 2022-12-13 LAB — RENAL FUNCTION PANEL
ALBUMIN: 4.2 g/dL (ref 3.4–5.0)
ANION GAP: 6 mmol/L (ref 5–14)
BLOOD UREA NITROGEN: 14 mg/dL (ref 9–23)
BUN / CREAT RATIO: 8
CALCIUM: 9.8 mg/dL (ref 8.7–10.4)
CHLORIDE: 106 mmol/L (ref 98–107)
CO2: 25 mmol/L (ref 20.0–31.0)
CREATININE: 1.75 mg/dL — ABNORMAL HIGH
EGFR CKD-EPI (2021) MALE: 50 mL/min/{1.73_m2} — ABNORMAL LOW (ref >=60)
GLUCOSE RANDOM: 99 mg/dL (ref 70–179)
PHOSPHORUS: 3 mg/dL (ref 2.4–5.1)
POTASSIUM: 3.8 mmol/L (ref 3.4–4.8)
SODIUM: 137 mmol/L (ref 135–145)

## 2022-12-13 LAB — MAGNESIUM: MAGNESIUM: 1.8 mg/dL (ref 1.6–2.6)

## 2022-12-13 MED ADMIN — belatacept (NULOJIX) 475 mg in sodium chloride (NS) 0.9 % 100 mL IVPB: 5 mg/kg | INTRAVENOUS | @ 15:00:00 | Stop: 2022-12-13

## 2022-12-13 NOTE — Unmapped (Signed)
1040 Patient arrived to the Transplant Infusion Room today for belatacept 475 mg, Condition: well; Mobility: ambulating; accompanied by self.   See Flowsheet and MAR for all details of visit.   1043 VS stable.  1055 PIV placed and secured with coban; labs collected and sent, urine no orders found.  1058 Infusion initiated. Patient educated to notify nursing staff if they experience urticaria, erythema, nausea, shortness of breath, nausea, metal taste in mouth, excessive oral or postnasal drainage and any other changes in status which could be side effects from initiating this medication.  1131 Infusion complete.   1138 VS stable, Line flushed with NS, PIV removed and secured with coban. Patient left clinic today, Condition: well; Mobility: ambulating; accompanied by self.

## 2022-12-18 NOTE — Unmapped (Signed)
Complex Case Management  SUMMARY NOTE    High Risk Care Coordinator  spoke with patient and verified correct patient using two identifiers today to introduce the Complex Case Management program.     Discussed the following:  Program Services, Expectations of participation, and Verified Demographics    Program status: Declined    Ryan Moon-High Risk Care Coordinator  [facility]  [address], [address]  [place], [place] [address]  She/Her/Hers  P: [phone] F: [phone]  [email or web address]

## 2022-12-20 NOTE — Unmapped (Signed)
7/18: Called pt. and left VM to schedule f/up with Dr. Toni Arthurs. Recall created-JY

## 2022-12-21 DIAGNOSIS — Z940 Kidney transplant status: Principal | ICD-10-CM

## 2022-12-24 DIAGNOSIS — Z940 Kidney transplant status: Principal | ICD-10-CM

## 2022-12-24 DIAGNOSIS — D849 Immunodeficiency, unspecified: Principal | ICD-10-CM

## 2022-12-24 MED ORDER — MG-PLUS-PROTEIN 133 MG TABLET
ORAL_TABLET | Freq: Every day | ORAL | 3 refills | 90 days | Status: CP
Start: 2022-12-24 — End: 2023-12-24
  Filled 2022-12-26: qty 90, 90d supply, fill #0

## 2022-12-25 MED ORDER — LOSARTAN 50 MG TABLET
ORAL | 3 refills | 90 days | Status: CP
Start: 2022-12-25 — End: 2023-12-25

## 2022-12-25 MED ORDER — OMEPRAZOLE 20 MG CAPSULE,DELAYED RELEASE
ORAL_CAPSULE | Freq: Every day | ORAL | 3 refills | 90 days | Status: CP
Start: 2022-12-25 — End: 2023-12-25
  Filled 2022-12-26: qty 30, 30d supply, fill #2

## 2022-12-25 NOTE — Unmapped (Signed)
Nurse called and spoke to patient.  Patient states he is having the swelling in his feet due to taking Amlodipine.  He ran out of the Losartan and started taking Amlodipine instead. Patient is requesting a prescription refill for Amlodipine be sent to his local pharmacy as written below.         [id number] [place], [place] - [address] Phone: [phone]   Fax: [phone]          Patient is requesting the following refill  Requested Prescriptions     Pending Prescriptions Disp Refills    losartan (COZAAR) 50 MG tablet 60 tablet 11     Sig: Take 1 tablet (50 mg total) by mouth two (2) times a day.       Order pended. Please advise. Thanks    Last OV: 07/04/2022   Next OV: Visit date not found

## 2022-12-27 ENCOUNTER — Ambulatory Visit
Admit: 2022-12-27 | Discharge: 2022-12-28 | Payer: PRIVATE HEALTH INSURANCE | Attending: Nephrology | Primary: Nephrology

## 2022-12-27 DIAGNOSIS — D849 Immunodeficiency, unspecified: Principal | ICD-10-CM

## 2022-12-27 DIAGNOSIS — Z940 Kidney transplant status: Principal | ICD-10-CM

## 2022-12-27 DIAGNOSIS — N1831 Stage 3a chronic kidney disease (CMS-HCC): Principal | ICD-10-CM

## 2022-12-27 DIAGNOSIS — I7782 ANCA-associated vasculitis (CMS-HCC): Principal | ICD-10-CM

## 2022-12-27 DIAGNOSIS — I10 Essential (primary) hypertension: Principal | ICD-10-CM

## 2022-12-27 DIAGNOSIS — R809 Proteinuria, unspecified: Principal | ICD-10-CM

## 2022-12-27 LAB — COMPREHENSIVE METABOLIC PANEL
ALBUMIN: 3.8 g/dL (ref 3.4–5.0)
ALKALINE PHOSPHATASE: 79 U/L (ref 46–116)
ALT (SGPT): 19 U/L (ref 10–49)
ANION GAP: 9 mmol/L (ref 5–14)
AST (SGOT): 19 U/L (ref <=34)
BILIRUBIN TOTAL: 0.3 mg/dL (ref 0.3–1.2)
BLOOD UREA NITROGEN: 23 mg/dL (ref 9–23)
BUN / CREAT RATIO: 13
CALCIUM: 10.1 mg/dL (ref 8.7–10.4)
CHLORIDE: 106 mmol/L (ref 98–107)
CO2: 27.3 mmol/L (ref 20.0–31.0)
CREATININE: 1.79 mg/dL — ABNORMAL HIGH
EGFR CKD-EPI (2021) MALE: 49 mL/min/{1.73_m2} — ABNORMAL LOW (ref >=60)
GLUCOSE RANDOM: 59 mg/dL — ABNORMAL LOW (ref 70–99)
POTASSIUM: 4.1 mmol/L (ref 3.4–4.8)
PROTEIN TOTAL: 7.1 g/dL (ref 5.7–8.2)
SODIUM: 142 mmol/L (ref 135–145)

## 2022-12-27 LAB — URINALYSIS WITH MICROSCOPY
BILIRUBIN UA: NEGATIVE
GLUCOSE UA: NEGATIVE
KETONES UA: NEGATIVE
LEUKOCYTE ESTERASE UA: NEGATIVE
NITRITE UA: NEGATIVE
PH UA: 6 (ref 5.0–9.0)
RBC UA: 3 /HPF — ABNORMAL HIGH (ref <3)
SPECIFIC GRAVITY UA: 1.01 (ref 1.005–1.030)
SQUAMOUS EPITHELIAL: <1 /HPF (ref 0–5)
UROBILINOGEN UA: 0.2
WBC UA: 3 /HPF — ABNORMAL HIGH (ref <2)

## 2022-12-27 LAB — HEPATIC FUNCTION PANEL
ALBUMIN: 3.8 g/dL (ref 3.4–5.0)
ALKALINE PHOSPHATASE: 79 U/L (ref 46–116)
ALT (SGPT): 19 U/L (ref 10–49)
AST (SGOT): 19 U/L (ref <=34)
BILIRUBIN DIRECT: <0.1 mg/dL (ref 0.00–0.30)
BILIRUBIN TOTAL: 0.3 mg/dL (ref 0.3–1.2)
PROTEIN TOTAL: 7.1 g/dL (ref 5.7–8.2)

## 2022-12-27 LAB — CBC W/ AUTO DIFF
BASOPHILS ABSOLUTE COUNT: 0 10*9/L (ref 0.0–0.1)
BASOPHILS RELATIVE PERCENT: 0.8 %
EOSINOPHILS ABSOLUTE COUNT: 0.1 10*9/L (ref 0.0–0.5)
EOSINOPHILS RELATIVE PERCENT: 3.2 %
HEMATOCRIT: 37.2 % — ABNORMAL LOW (ref 39.0–48.0)
HEMOGLOBIN: 12.6 g/dL — ABNORMAL LOW (ref 12.9–16.5)
LYMPHOCYTES ABSOLUTE COUNT: 0.8 10*9/L — ABNORMAL LOW (ref 1.1–3.6)
LYMPHOCYTES RELATIVE PERCENT: 34.4 %
MEAN CORPUSCULAR HEMOGLOBIN CONC: 34 g/dL (ref 32.0–36.0)
MEAN CORPUSCULAR HEMOGLOBIN: 31.6 pg (ref 25.9–32.4)
MEAN CORPUSCULAR VOLUME: 93 fL (ref 77.6–95.7)
MEAN PLATELET VOLUME: 7.1 fL (ref 6.8–10.7)
MONOCYTES ABSOLUTE COUNT: 0.4 10*9/L (ref 0.3–0.8)
MONOCYTES RELATIVE PERCENT: 18.4 %
NEUTROPHILS ABSOLUTE COUNT: 1 10*9/L — ABNORMAL LOW (ref 1.8–7.8)
NEUTROPHILS RELATIVE PERCENT: 43.2 %
PLATELET COUNT: 225 10*9/L (ref 150–450)
RED BLOOD CELL COUNT: 4 10*12/L — ABNORMAL LOW (ref 4.26–5.60)
RED CELL DISTRIBUTION WIDTH: 14.6 % (ref 12.2–15.2)
WBC ADJUSTED: 2.4 10*9/L — ABNORMAL LOW (ref 3.6–11.2)

## 2022-12-27 LAB — MAGNESIUM: MAGNESIUM: 1.8 mg/dL (ref 1.6–2.6)

## 2022-12-27 LAB — HEMOGLOBIN A1C
ESTIMATED AVERAGE GLUCOSE: 105 mg/dL
HEMOGLOBIN A1C: 5.3 % (ref 4.8–5.6)

## 2022-12-27 LAB — LIPID PANEL
CHOLESTEROL/HDL RATIO SCREEN: 3.2 (ref 1.0–4.5)
CHOLESTEROL: 219 mg/dL — ABNORMAL HIGH (ref <=200)
HDL CHOLESTEROL: 68 mg/dL — ABNORMAL HIGH (ref 40–60)
LDL CHOLESTEROL CALCULATED: 127 mg/dL — ABNORMAL HIGH (ref 40–99)
NON-HDL CHOLESTEROL: 151 mg/dL — ABNORMAL HIGH (ref 70–130)
TRIGLYCERIDES: 120 mg/dL (ref 0–150)
VLDL CHOLESTEROL CAL: 24 mg/dL (ref 10–50)

## 2022-12-27 LAB — PROTEIN / CREATININE RATIO, URINE
CREATININE, URINE: 37.4 mg/dL
PROTEIN URINE: 34.5 mg/dL
PROTEIN/CREAT RATIO, URINE: 0.922

## 2022-12-27 LAB — TACROLIMUS LEVEL, TROUGH: TACROLIMUS, TROUGH: <1 ng/mL — ABNORMAL LOW (ref 5.0–15.0)

## 2022-12-27 LAB — SLIDE REVIEW

## 2022-12-27 LAB — PHOSPHORUS: PHOSPHORUS: 3.9 mg/dL (ref 2.4–5.1)

## 2022-12-27 MED ORDER — EMPAGLIFLOZIN 10 MG TABLET
ORAL_TABLET | Freq: Every day | ORAL | 11 refills | 30 days | Status: CP
Start: 2022-12-27 — End: ?

## 2022-12-27 NOTE — Unmapped (Signed)
Pt left before scheduling next appt.

## 2022-12-27 NOTE — Unmapped (Signed)
[facility] [place] transplant nephrology clinic visit    assessment and plan  1. 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 baseline creatinine 1.5-1.8 mg/dl. +proteinuria. stage iiia chr kidney disease. no donor specific hla ab detected '24.  2. immunosuppression. belatacept 5mg /kg q.month. mycophenolate on hold d/t leukopenia. prednisone 5mg  daily.   3. mpo-anca+ recurrent gn. microscopic hematuria significantly improved. rituximab 1g x2 1-2/24. plan transition to obinituzumab 1g q27m d/t serum sickness reaction with rituximab; next infusion 8/24.  4. hypertension. losartan 50mg  bid. blood pressure goal <130/80 mmhg.  5. proteinuria. arb. empagliflozin 10mg  daily initiated.    history of present illness    Ryan Moon is a 37 year old gentleman seen in follow up post kidney transplant 04/28/2019. post transplant course complicated with recurrent mpo anca+ crescentic gn trtmt with iv methylprednisolone, rituximab 1g x2 1-2/24. +diffuse arthralgias noted 2-3d post both rituximab infusions concerning for serum sickness like reaction.     he feels well in usual state of health. no fever chills or sweats. no headache or lightheaded. no chest pain palpitations cough or shortness of breath. no lower extremity edema. appetite normal. no n/v/d. no myalgias or arthralgias. no dysuria hematuria or difficulty voiding.    past medical hx:  1. 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 mpo anca+ gn. alemtuzumab induction. baseline creatinine 1.5-1.8 mg/dl.   > kidney bx 01/21: no acute/chr rejection.  > kidney bx 01/24: recurrent anca+ necrotizing and crescentic gn. trtmt iv methylprednisolone, rituximab 1g x2 1-2/24.  2. hypertension  3. anxiety/depression    allergies: no known drug allergies    medications: belatacept 5mg /kg q.month, prednisone 5mg  daily, losartan 50mg  bid, mg oxide 133mg  daily, omeprazole 20mg  daily, alprazolam 0.5mg  prn.    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    physical exam: t96.9 p78 bp130/84 wt92.2kg bmi 29.2. wd/wn gentleman appropriate affect and mood. sclera anicteric. mmm no thrush. neck supple no palpable ln. heart rrr nl s1s2 no m/r/g. lungs clear bilateral. abd soft nt/nd. no lower ext edema. msk no synovitis/tophi. skin no rash. neuro alert oriented non focal exam.

## 2022-12-27 NOTE — Unmapped (Signed)
Met w/ patient in [facility] today. Reviewed meds/symptoms. Any new medications?                 Fever/cold/flu symptoms denies  BP: 130/84 today/ Home BP reported 120-130/70-80  BG: wnl  Headache/Dizziness/Lightheaded: denies  Hand tremors: denies  Numbness/tingling: no  Fevers/chills/sweats: no  CP/SOB/palpatations: no  Nausea/vomiting/heartburn: denies  Diarrhea/constipation: denies  UTI symptoms (burn/pain/itch/frequency/urgency/odor/color/foam): denies  No visible or palpable edema     Appetite good; reports adequate hydration. 5-6x 16 oz/bottles/fluid per day.     Pt reports being well rested and getting adequate exercise.     Continues to follow Covid/health safety precautions by taking care to mask, perform frequent hand hygeine and minimal public activity. Offered support and guidance for this process given their immune-suppressed state. We discussed reduced covid vaccine coverage for transplant patients and importance of continuing to mask and practice safe distancing. Commented that booster vaccines will likely be advised as an ongoing process.     Pain: denies recent pain     Last BELA infusion 12/13/22; Myfortic ON HOLD d/t leukopenia, Pred 5mg  daily     Other complaints or concerns: none       Immunization status: refuses all vaccines     Functional Score: 100

## 2022-12-28 LAB — CMV DNA, QUANTITATIVE, PCR: CMV VIRAL LD: NOT DETECTED

## 2022-12-28 LAB — BK VIRUS QUANTITATIVE PCR, BLOOD: BK BLOOD RESULT: NOT DETECTED

## 2023-01-01 LAB — HLA DS POST TRANSPLANT
ANTI-DONOR DRW #1 MFI: 0 MFI
ANTI-DONOR DRW #2 MFI: 0 MFI
ANTI-DONOR HLA-A #1 MFI: 13 MFI
ANTI-DONOR HLA-A #2 MFI: 50 MFI
ANTI-DONOR HLA-B #1 MFI: 0 MFI
ANTI-DONOR HLA-B #2 MFI: 0 MFI
ANTI-DONOR HLA-C #2 MFI: 0 MFI
ANTI-DONOR HLA-DQB #1 MFI: 22 MFI
ANTI-DONOR HLA-DQB #2 MFI: 26 MFI
ANTI-DONOR HLA-DR #1 MFI: 34 MFI
ANTI-DONOR HLA-DR #2 MFI: 37 MFI

## 2023-01-01 LAB — FSAB CLASS 2 ANTIBODY SPECIFICITY: HLA CL2 AB RESULT: NEGATIVE

## 2023-01-01 LAB — FSAB CLASS 1 ANTIBODY SPECIFICITY: HLA CLASS 1 ANTIBODY RESULT: NEGATIVE

## 2023-01-03 DIAGNOSIS — D849 Immunodeficiency, unspecified: Principal | ICD-10-CM

## 2023-01-03 DIAGNOSIS — D72819 Decreased white blood cell count, unspecified: Principal | ICD-10-CM

## 2023-01-03 DIAGNOSIS — Z940 Kidney transplant status: Principal | ICD-10-CM

## 2023-01-09 ENCOUNTER — Ambulatory Visit: Admit: 2023-01-09 | Discharge: 2023-01-10 | Payer: PRIVATE HEALTH INSURANCE

## 2023-01-09 LAB — URINALYSIS WITH MICROSCOPY WITH CULTURE REFLEX PERFORMABLE
BACTERIA: NONE SEEN /HPF
BILIRUBIN UA: NEGATIVE
GLUCOSE UA: NEGATIVE
KETONES UA: NEGATIVE
LEUKOCYTE ESTERASE UA: NEGATIVE
NITRITE UA: NEGATIVE
PH UA: 5.5 (ref 5.0–9.0)
PROTEIN UA: NEGATIVE
RBC UA: <1 /HPF (ref <=3)
SPECIFIC GRAVITY UA: 1.005 (ref 1.003–1.030)
SQUAMOUS EPITHELIAL: <1 /HPF (ref 0–5)
UROBILINOGEN UA: <2
WBC UA: <1 /HPF (ref <=2)

## 2023-01-09 LAB — RENAL FUNCTION PANEL
ALBUMIN: 4.1 g/dL (ref 3.4–5.0)
ANION GAP: 7 mmol/L (ref 5–14)
BLOOD UREA NITROGEN: 24 mg/dL — ABNORMAL HIGH (ref 9–23)
BUN / CREAT RATIO: 13
CALCIUM: 9.9 mg/dL (ref 8.7–10.4)
CHLORIDE: 107 mmol/L (ref 98–107)
CO2: 24 mmol/L (ref 20.0–31.0)
CREATININE: 1.85 mg/dL — ABNORMAL HIGH
EGFR CKD-EPI (2021) MALE: 47 mL/min/{1.73_m2} — ABNORMAL LOW (ref >=60)
GLUCOSE RANDOM: 91 mg/dL (ref 70–179)
PHOSPHORUS: 2.4 mg/dL (ref 2.4–5.1)
POTASSIUM: 3.6 mmol/L (ref 3.4–4.8)
SODIUM: 138 mmol/L (ref 135–145)

## 2023-01-09 LAB — CBC W/ AUTO DIFF
BASOPHILS ABSOLUTE COUNT: 0 10*9/L (ref 0.0–0.1)
BASOPHILS RELATIVE PERCENT: 1 %
EOSINOPHILS ABSOLUTE COUNT: 0.1 10*9/L (ref 0.0–0.5)
EOSINOPHILS RELATIVE PERCENT: 2.8 %
HEMATOCRIT: 37.9 % — ABNORMAL LOW (ref 39.0–48.0)
HEMOGLOBIN: 13.3 g/dL (ref 12.9–16.5)
LYMPHOCYTES ABSOLUTE COUNT: 0.7 10*9/L — ABNORMAL LOW (ref 1.1–3.6)
LYMPHOCYTES RELATIVE PERCENT: 31.2 %
MEAN CORPUSCULAR HEMOGLOBIN CONC: 35.1 g/dL (ref 32.0–36.0)
MEAN CORPUSCULAR HEMOGLOBIN: 32.2 pg (ref 25.9–32.4)
MEAN CORPUSCULAR VOLUME: 91.7 fL (ref 77.6–95.7)
MEAN PLATELET VOLUME: 7.3 fL (ref 6.8–10.7)
MONOCYTES ABSOLUTE COUNT: 0.4 10*9/L (ref 0.3–0.8)
MONOCYTES RELATIVE PERCENT: 17.9 %
NEUTROPHILS ABSOLUTE COUNT: 1 10*9/L — ABNORMAL LOW (ref 1.8–7.8)
NEUTROPHILS RELATIVE PERCENT: 47.1 %
PLATELET COUNT: 238 10*9/L (ref 150–450)
RED BLOOD CELL COUNT: 4.13 10*12/L — ABNORMAL LOW (ref 4.26–5.60)
RED CELL DISTRIBUTION WIDTH: 14.2 % (ref 12.2–15.2)
WBC ADJUSTED: 2.2 10*9/L — ABNORMAL LOW (ref 3.6–11.2)

## 2023-01-09 LAB — SLIDE REVIEW

## 2023-01-09 LAB — MAGNESIUM: MAGNESIUM: 2 mg/dL (ref 1.6–2.6)

## 2023-01-09 MED ORDER — MG-PLUS-PROTEIN 133 MG TABLET
ORAL_TABLET | Freq: Every day | ORAL | 3 refills | 90 days
Start: 2023-01-09 — End: 2024-01-09

## 2023-01-09 MED ADMIN — diphenhydrAMINE (BENADRYL) injection: 50 mg | INTRAVENOUS | @ 12:00:00 | Stop: 2023-01-09

## 2023-01-09 MED ADMIN — sodium chloride (NS) 0.9 % infusion: 20 mL/h | INTRAVENOUS | @ 12:00:00 | Stop: 2023-01-09

## 2023-01-09 MED ADMIN — acetaminophen (TYLENOL) tablet 650 mg: 650 mg | ORAL | @ 12:00:00 | Stop: 2023-01-09

## 2023-01-09 MED ADMIN — belatacept (NULOJIX) 462.5 mg in sodium chloride (NS) 0.9 % 100 mL IVPB: 5 mg/kg | INTRAVENOUS | @ 13:00:00 | Stop: 2023-01-09

## 2023-01-09 MED ADMIN — methylPREDNISolone sodium succinate (PF) (SOLU-Medrol) injection 125 mg: 125 mg | INTRAVENOUS | @ 12:00:00 | Stop: 2023-01-09

## 2023-01-09 MED ADMIN — obinutuzumab 1,000 mg in sodium chloride (NS) 0.9 % 250 mL IVPB: 1000 mg | INTRAVENOUS | @ 13:00:00 | Stop: 2023-01-09

## 2023-01-09 NOTE — Unmapped (Signed)
See Obinutuzumab encounter for details of Belatacept infusion and note.

## 2023-01-09 NOTE — Unmapped (Signed)
5621 Patient arrived to the Transplant Infusion Room today for belatacept 462.5mg  followed by Obinutuzumab 1000mg . Condition: well; Mobility: ambulating; accompanied by spouse.   See Flowsheet and MAR for all details of visit.   0757 VS stable.  0813 PIV placed and secured with coban; labs collected and sent, urine collected and sent.  3086 Premedications given.  5784 Belatacept infusion initiated. Patient educated to notify nursing staff if they experience urticaria, erythema, nausea, shortness of breath, nausea, metal taste in mouth, excessive oral or postnasal drainage and any other changes in status which could be side effects from initiating this medication.  6962 Belatacept infusion complete.   Today's lab results: WBC 2.2, MD Toni Arthurs notified of low WBC and instructed ok to proceed with infusion. Platelets 238 (must be > 50 or hold infusion and notify provider). Hgb 13.3.  0915 Obinutuzumab infusion initiated rituximab: rituximab subsequent infusion protocol to start 100 mg/hr, rate increase by 100 mg/hr every 30 minutes as tolerated till maximum rate of 400 mg/hr. Patient educated to notify nursing staff if they experience urticaria, erythema, nausea, shortness of breath, metallic taste in mouth, excessive oral or postnasal drainage and any other changes in status which could be side effects from initiating this medication.  1225 Obinutuzumab infusion completed, pt tolerated infusion well.  1226 VS stable, PIV removed and site secured with coban. Patient left clinic today, condition: well; mobility: ambulating; accompanied by spouse.

## 2023-01-22 MED FILL — PREDNISONE 5 MG TABLET: ORAL | 90 days supply | Qty: 90 | Fill #1

## 2023-01-22 MED FILL — MG-PLUS-PROTEIN 133 MG TABLET: ORAL | 90 days supply | Qty: 90 | Fill #1

## 2023-01-22 MED FILL — OMEPRAZOLE 20 MG CAPSULE,DELAYED RELEASE: ORAL | 30 days supply | Qty: 30 | Fill #3

## 2023-02-13 ENCOUNTER — Ambulatory Visit: Admit: 2023-02-13 | Discharge: 2023-02-14 | Payer: PRIVATE HEALTH INSURANCE

## 2023-02-13 LAB — CBC W/ AUTO DIFF
BASOPHILS ABSOLUTE COUNT: 0 10*9/L (ref 0.0–0.1)
BASOPHILS RELATIVE PERCENT: 0.8 %
EOSINOPHILS ABSOLUTE COUNT: 0 10*9/L (ref 0.0–0.5)
EOSINOPHILS RELATIVE PERCENT: 2.1 %
HEMATOCRIT: 35.6 % — ABNORMAL LOW (ref 39.0–48.0)
HEMOGLOBIN: 12.5 g/dL — ABNORMAL LOW (ref 12.9–16.5)
LYMPHOCYTES ABSOLUTE COUNT: 0.5 10*9/L — ABNORMAL LOW (ref 1.1–3.6)
LYMPHOCYTES RELATIVE PERCENT: 29.3 %
MEAN CORPUSCULAR HEMOGLOBIN CONC: 35 g/dL (ref 32.0–36.0)
MEAN CORPUSCULAR HEMOGLOBIN: 32.6 pg — ABNORMAL HIGH (ref 25.9–32.4)
MEAN CORPUSCULAR VOLUME: 93 fL (ref 77.6–95.7)
MEAN PLATELET VOLUME: 7.3 fL (ref 6.8–10.7)
MONOCYTES ABSOLUTE COUNT: 0.4 10*9/L (ref 0.3–0.8)
MONOCYTES RELATIVE PERCENT: 21.6 %
NEUTROPHILS ABSOLUTE COUNT: 0.8 10*9/L — ABNORMAL LOW (ref 1.8–7.8)
NEUTROPHILS RELATIVE PERCENT: 46.2 %
PLATELET COUNT: 217 10*9/L (ref 150–450)
RED BLOOD CELL COUNT: 3.83 10*12/L — ABNORMAL LOW (ref 4.26–5.60)
RED CELL DISTRIBUTION WIDTH: 14.2 % (ref 12.2–15.2)
WBC ADJUSTED: 1.7 10*9/L — ABNORMAL LOW (ref 3.6–11.2)

## 2023-02-13 LAB — RENAL FUNCTION PANEL
ALBUMIN: 4.3 g/dL (ref 3.4–5.0)
ANION GAP: 10 mmol/L (ref 5–14)
BLOOD UREA NITROGEN: 20 mg/dL (ref 9–23)
BUN / CREAT RATIO: 10
CALCIUM: 9.9 mg/dL (ref 8.7–10.4)
CHLORIDE: 105 mmol/L (ref 98–107)
CO2: 23 mmol/L (ref 20.0–31.0)
CREATININE: 2.06 mg/dL — ABNORMAL HIGH
EGFR CKD-EPI (2021) MALE: 41 mL/min/{1.73_m2} — ABNORMAL LOW (ref >=60)
GLUCOSE RANDOM: 126 mg/dL — ABNORMAL HIGH (ref 70–99)
PHOSPHORUS: 3.4 mg/dL (ref 2.4–5.1)
POTASSIUM: 4 mmol/L (ref 3.4–4.8)
SODIUM: 138 mmol/L (ref 135–145)

## 2023-02-13 LAB — SLIDE REVIEW

## 2023-02-13 MED ADMIN — belatacept (NULOJIX) 462.5 mg in sodium chloride (NS) 0.9 % 100 mL IVPB: 5 mg/kg | INTRAVENOUS | @ 14:00:00 | Stop: 2023-02-13

## 2023-02-13 NOTE — Unmapped (Signed)
0945 Patient arrived to the Transplant Infusion Room today for belatacept 462.5mg , Condition: well; Mobility: ambulating; accompanied by self.   See Flowsheet and MAR for all details of visit.   8413 VS stable.  1000 PIV placed and secured with coban; labs collected and sent, urine no orders found.  1009 Infusion initiated. Patient educated to notify nursing staff if they experience urticaria, erythema, nausea, shortness of breath, nausea, metal taste in mouth, excessive oral or postnasal drainage and any other changes in status which could be side effects from initiating this medication.  1040 Infusion complete.   1042 VS stable, Line flushed with NS, PIV removed and secured with coban. Patient left clinic today, Condition: well; Mobility: ambulating; accompanied by self.

## 2023-02-25 MED FILL — OMEPRAZOLE 20 MG CAPSULE,DELAYED RELEASE: ORAL | 30 days supply | Qty: 30 | Fill #4

## 2023-03-05 DIAGNOSIS — D72819 Decreased white blood cell count, unspecified: Principal | ICD-10-CM

## 2023-03-05 DIAGNOSIS — Z940 Kidney transplant status: Principal | ICD-10-CM

## 2023-03-05 DIAGNOSIS — D849 Immunodeficiency, unspecified: Principal | ICD-10-CM

## 2023-03-13 ENCOUNTER — Ambulatory Visit: Admit: 2023-03-13 | Discharge: 2023-03-14 | Payer: PRIVATE HEALTH INSURANCE

## 2023-03-13 LAB — RENAL FUNCTION PANEL
ALBUMIN: 4.3 g/dL (ref 3.4–5.0)
ANION GAP: 8 mmol/L (ref 5–14)
BLOOD UREA NITROGEN: 20 mg/dL (ref 9–23)
BUN / CREAT RATIO: 11
CALCIUM: 9.8 mg/dL (ref 8.7–10.4)
CHLORIDE: 104 mmol/L (ref 98–107)
CO2: 27 mmol/L (ref 20.0–31.0)
CREATININE: 1.84 mg/dL — ABNORMAL HIGH
EGFR CKD-EPI (2021) MALE: 47 mL/min/{1.73_m2} — ABNORMAL LOW (ref >=60)
GLUCOSE RANDOM: 96 mg/dL (ref 70–99)
PHOSPHORUS: 3.3 mg/dL (ref 2.4–5.1)
POTASSIUM: 4.3 mmol/L (ref 3.4–4.8)
SODIUM: 139 mmol/L (ref 135–145)

## 2023-03-13 LAB — CBC W/ AUTO DIFF
BASOPHILS ABSOLUTE COUNT: 0 10*9/L (ref 0.0–0.1)
BASOPHILS RELATIVE PERCENT: 1.1 %
EOSINOPHILS ABSOLUTE COUNT: 0 10*9/L (ref 0.0–0.5)
EOSINOPHILS RELATIVE PERCENT: 1.8 %
HEMATOCRIT: 35.8 % — ABNORMAL LOW (ref 39.0–48.0)
HEMOGLOBIN: 12.7 g/dL — ABNORMAL LOW (ref 12.9–16.5)
LYMPHOCYTES ABSOLUTE COUNT: 0.5 10*9/L — ABNORMAL LOW (ref 1.1–3.6)
LYMPHOCYTES RELATIVE PERCENT: 25.2 %
MEAN CORPUSCULAR HEMOGLOBIN CONC: 35.6 g/dL (ref 32.0–36.0)
MEAN CORPUSCULAR HEMOGLOBIN: 32.7 pg — ABNORMAL HIGH (ref 25.9–32.4)
MEAN CORPUSCULAR VOLUME: 92 fL (ref 77.6–95.7)
MEAN PLATELET VOLUME: 7.7 fL (ref 6.8–10.7)
MONOCYTES ABSOLUTE COUNT: 0.4 10*9/L (ref 0.3–0.8)
MONOCYTES RELATIVE PERCENT: 22 %
NEUTROPHILS ABSOLUTE COUNT: 1 10*9/L — ABNORMAL LOW (ref 1.8–7.8)
NEUTROPHILS RELATIVE PERCENT: 49.9 %
PLATELET COUNT: 222 10*9/L (ref 150–450)
RED BLOOD CELL COUNT: 3.89 10*12/L — ABNORMAL LOW (ref 4.26–5.60)
RED CELL DISTRIBUTION WIDTH: 13.8 % (ref 12.2–15.2)
WBC ADJUSTED: 1.9 10*9/L — ABNORMAL LOW (ref 3.6–11.2)

## 2023-03-13 LAB — SLIDE REVIEW

## 2023-03-13 MED ADMIN — belatacept (NULOJIX) 450 mg in sodium chloride (NS) 0.9 % 100 mL IVPB: 5 mg/kg | INTRAVENOUS | @ 18:00:00 | Stop: 2023-03-13

## 2023-03-13 NOTE — Unmapped (Signed)
1410 Patient arrived to the Transplant Infusion Room today for belatacept 450 mg, Condition: well; Mobility: ambulating; accompanied by self.   See Flowsheet and MAR for all details of visit.   1414 VS stable.  1425 PIV placed and secured with coban; labs collected and sent, urine no orders found.  1428 Infusion initiated. Patient educated to notify nursing staff if they experience urticaria, erythema, nausea, shortness of breath, nausea, metal taste in mouth, excessive oral or postnasal drainage and any other changes in status which could be side effects from initiating this medication.  1500 Infusion complete.   1508 VS stable, Line flushed with NS, PIV removed and secured with coban. Patient left clinic today, Condition: well; Mobility: ambulating; accompanied by self.

## 2023-04-05 DIAGNOSIS — Z940 Kidney transplant status: Principal | ICD-10-CM

## 2023-04-05 DIAGNOSIS — D849 Immunodeficiency, unspecified: Principal | ICD-10-CM

## 2023-04-05 DIAGNOSIS — D72819 Decreased white blood cell count, unspecified: Principal | ICD-10-CM

## 2023-04-08 NOTE — Unmapped (Signed)
[facility] Delivery Pharm[facility] Refill Coordination Note    Specialty Medication(s) to be Shipped:   Transplant: Prednisone 5mg     Other medication(s) to be shipped:  omeprazole      Ryan Moon, DOB: 1985-06-06  Phone: [phone] (home) [phone] (work)      All above HIPAA information was verified with patient.     Was a Nurse, learning disability used for this call? No    Completed refill call assessment today to schedule patient's medication shipment from the [facility]  [phone]).  All relevant notes have been reviewed.     Specialty medication(s) and dose(s) confirmed: Regimen is correct and unchanged.   Changes to medications: Ryan Moon reports no changes at this time.  Changes to insurance: No  New side effects reported not previously addressed with a pharmacist or physician: None reported  Questions for the pharmacist: No    Confirmed patient received a Conservation officer, historic buildings and a Surveyor, mining with first shipment. The patient will receive a drug information handout for each medication shipped and additional FDA Medication Guides as required.       DISEASE/MEDICATION-SPECIFIC INFORMATION        N/A    SPECIALTY MEDICATION ADHERENCE     Medication Adherence    Patient reported X missed doses in the last month: 0  Specialty Medication: predniSONE 5 MG tablet (DELTASONE)  Patient is on additional specialty medications: No              Were doses missed due to medication being on hold? No      predniSONE 5 MG tablet (DELTASONE): 10 days of medicine on hand       REFERRAL TO PHARMACIST     Referral to the pharmacist: Not needed      [facility]     Shipping address confirmed in Epic.       Delivery Scheduled: Yes, Expected medication delivery date: 04/12/2023.     Medication will be delivered via UPS to the prescription address in Epic WAM.    [name] [facility]  Specialty Technician

## 2023-04-10 ENCOUNTER — Ambulatory Visit: Admit: 2023-04-10 | Discharge: 2023-04-11 | Payer: BLUE CROSS/BLUE SHIELD

## 2023-04-10 LAB — RENAL FUNCTION PANEL
ALBUMIN: 4.1 g/dL (ref 3.4–5.0)
ANION GAP: 8 mmol/L (ref 5–14)
BLOOD UREA NITROGEN: 21 mg/dL (ref 9–23)
BUN / CREAT RATIO: 14
CALCIUM: 9.4 mg/dL (ref 8.7–10.4)
CHLORIDE: 107 mmol/L (ref 98–107)
CO2: 22 mmol/L (ref 20.0–31.0)
CREATININE: 1.49 mg/dL — ABNORMAL HIGH
EGFR CKD-EPI (2021) MALE: 61 mL/min/{1.73_m2} (ref >=60)
GLUCOSE RANDOM: 63 mg/dL — ABNORMAL LOW (ref 70–99)
PHOSPHORUS: 3.5 mg/dL (ref 2.4–5.1)
POTASSIUM: 4 mmol/L (ref 3.4–4.8)
SODIUM: 137 mmol/L (ref 135–145)

## 2023-04-10 LAB — CBC W/ AUTO DIFF
BASOPHILS ABSOLUTE COUNT: 0 10*9/L (ref 0.0–0.1)
BASOPHILS RELATIVE PERCENT: 1.4 %
EOSINOPHILS ABSOLUTE COUNT: 0.1 10*9/L (ref 0.0–0.5)
EOSINOPHILS RELATIVE PERCENT: 5.2 %
HEMATOCRIT: 37.8 % — ABNORMAL LOW (ref 39.0–48.0)
HEMOGLOBIN: 13.3 g/dL (ref 12.9–16.5)
LYMPHOCYTES ABSOLUTE COUNT: 0.9 10*9/L — ABNORMAL LOW (ref 1.1–3.6)
LYMPHOCYTES RELATIVE PERCENT: 35.3 %
MEAN CORPUSCULAR HEMOGLOBIN CONC: 35.2 g/dL (ref 32.0–36.0)
MEAN CORPUSCULAR HEMOGLOBIN: 31.7 pg (ref 25.9–32.4)
MEAN CORPUSCULAR VOLUME: 90.3 fL (ref 77.6–95.7)
MEAN PLATELET VOLUME: 7.3 fL (ref 6.8–10.7)
MONOCYTES ABSOLUTE COUNT: 0.5 10*9/L (ref 0.3–0.8)
MONOCYTES RELATIVE PERCENT: 22.1 %
NEUTROPHILS ABSOLUTE COUNT: 0.9 10*9/L — ABNORMAL LOW (ref 1.8–7.8)
NEUTROPHILS RELATIVE PERCENT: 36 %
PLATELET COUNT: 233 10*9/L (ref 150–450)
RED BLOOD CELL COUNT: 4.19 10*12/L — ABNORMAL LOW (ref 4.26–5.60)
RED CELL DISTRIBUTION WIDTH: 13.6 % (ref 12.2–15.2)
WBC ADJUSTED: 2.4 10*9/L — ABNORMAL LOW (ref 3.6–11.2)

## 2023-04-10 LAB — SLIDE REVIEW

## 2023-04-10 MED ADMIN — belatacept (NULOJIX) 450 mg in sodium chloride (NS) 0.9 % 100 mL IVPB: 5 mg/kg | INTRAVENOUS | @ 16:00:00 | Stop: 2023-04-10

## 2023-04-10 NOTE — Unmapped (Signed)
1105 Patient arrived to the Transplant Infusion Room today for belatacept 450 mg, Condition: well; Mobility: ambulating; accompanied by self.   See Flowsheet and MAR for all details of visit.   1109 VS stable.  1118 PIV placed and secured with coban; labs collected and sent, urine no orders found.  1119 Infusion initiated. Patient educated to notify nursing staff if they experience urticaria, erythema, nausea, shortness of breath, nausea, metal taste in mouth, excessive oral or postnasal drainage and any other changes in status which could be side effects from initiating this medication.  1155 Infusion complete.   1205 VS stable, Line flushed with NS, PIV removed and secured with coban. Patient left clinic today, Condition: well; Mobility: ambulating; accompanied by self.

## 2023-04-11 MED FILL — OMEPRAZOLE 20 MG CAPSULE,DELAYED RELEASE: ORAL | 30 days supply | Qty: 30 | Fill #5

## 2023-04-11 MED FILL — PREDNISONE 5 MG TABLET: ORAL | 90 days supply | Qty: 90 | Fill #2

## 2023-05-03 ENCOUNTER — Ambulatory Visit: Admit: 2023-05-03 | Discharge: 2023-05-05 | Payer: BLUE CROSS/BLUE SHIELD

## 2023-05-03 LAB — URINALYSIS WITH MICROSCOPY WITH CULTURE REFLEX PERFORMABLE
BACTERIA: NONE SEEN /HPF
BILIRUBIN UA: NEGATIVE
GLUCOSE UA: NEGATIVE
KETONES UA: NEGATIVE
LEUKOCYTE ESTERASE UA: NEGATIVE
NITRITE UA: NEGATIVE
PH UA: 5.5 (ref 5.0–9.0)
PROTEIN UA: 30 — AB
RBC UA: 5 /HPF — ABNORMAL HIGH (ref <=3)
SPECIFIC GRAVITY UA: 1.008 (ref 1.003–1.030)
SQUAMOUS EPITHELIAL: <1 /HPF (ref 0–5)
UROBILINOGEN UA: <2
WBC UA: <1 /HPF (ref <=2)

## 2023-05-03 LAB — HEPATIC FUNCTION PANEL
ALBUMIN: 3.9 g/dL (ref 3.4–5.0)
ALKALINE PHOSPHATASE: 206 U/L — ABNORMAL HIGH (ref 46–116)
ALT (SGPT): 66 U/L — ABNORMAL HIGH (ref 10–49)
AST (SGOT): 78 U/L — ABNORMAL HIGH (ref <=34)
BILIRUBIN DIRECT: 0.1 mg/dL (ref 0.00–0.30)
BILIRUBIN TOTAL: 0.4 mg/dL (ref 0.3–1.2)
PROTEIN TOTAL: 8 g/dL (ref 5.7–8.2)

## 2023-05-03 LAB — CBC W/ AUTO DIFF
BASOPHILS ABSOLUTE COUNT: 0 10*9/L (ref 0.0–0.1)
BASOPHILS RELATIVE PERCENT: 0.6 %
EOSINOPHILS ABSOLUTE COUNT: 0.1 10*9/L (ref 0.0–0.5)
EOSINOPHILS RELATIVE PERCENT: 3.7 %
HEMATOCRIT: 36.6 % — ABNORMAL LOW (ref 39.0–48.0)
HEMOGLOBIN: 13 g/dL (ref 12.9–16.5)
LYMPHOCYTES ABSOLUTE COUNT: 0.5 10*9/L — ABNORMAL LOW (ref 1.1–3.6)
LYMPHOCYTES RELATIVE PERCENT: 13.8 %
MEAN CORPUSCULAR HEMOGLOBIN CONC: 35.5 g/dL (ref 32.0–36.0)
MEAN CORPUSCULAR HEMOGLOBIN: 31.7 pg (ref 25.9–32.4)
MEAN CORPUSCULAR VOLUME: 89.5 fL (ref 77.6–95.7)
MEAN PLATELET VOLUME: 6.9 fL (ref 6.8–10.7)
MONOCYTES ABSOLUTE COUNT: 0.9 10*9/L — ABNORMAL HIGH (ref 0.3–0.8)
MONOCYTES RELATIVE PERCENT: 25.2 %
NEUTROPHILS ABSOLUTE COUNT: 1.9 10*9/L (ref 1.8–7.8)
NEUTROPHILS RELATIVE PERCENT: 56.7 %
PLATELET COUNT: 269 10*9/L (ref 150–450)
RED BLOOD CELL COUNT: 4.09 10*12/L — ABNORMAL LOW (ref 4.26–5.60)
RED CELL DISTRIBUTION WIDTH: 13.1 % (ref 12.2–15.2)
WBC ADJUSTED: 3.4 10*9/L — ABNORMAL LOW (ref 3.6–11.2)

## 2023-05-03 LAB — BLOOD GAS CRITICAL CARE PANEL, VENOUS
BASE EXCESS VENOUS: -1.4 (ref -2.0–2.0)
CALCIUM IONIZED VENOUS (MG/DL): 4.86 mg/dL (ref 4.40–5.40)
GLUCOSE WHOLE BLOOD: 106 mg/dL (ref 70–179)
HCO3 VENOUS: 24 mmol/L (ref 22–27)
HEMOGLOBIN BLOOD GAS: 13.9 g/dL (ref 13.50–17.50)
LACTATE BLOOD VENOUS: 0.6 mmol/L (ref 0.5–1.8)
O2 SATURATION VENOUS: 57.8 % (ref 40.0–85.0)
PCO2 VENOUS: 42 mmHg (ref 40–60)
PH VENOUS: 7.37 (ref 7.32–7.43)
PO2 VENOUS: 33 mmHg (ref 30–55)
POTASSIUM WHOLE BLOOD: 4.4 mmol/L (ref 3.4–4.6)
SODIUM WHOLE BLOOD: 136 mmol/L (ref 135–145)

## 2023-05-03 LAB — SLIDE REVIEW

## 2023-05-03 LAB — TOXICOLOGY SCREEN, URINE
AMPHETAMINE SCREEN URINE: NEGATIVE
BARBITURATE SCREEN URINE: NEGATIVE
BENZODIAZEPINE SCREEN, URINE: POSITIVE — AB
BUPRENORPHINE, URINE SCREEN: NEGATIVE
CANNABINOID SCREEN URINE: POSITIVE — AB
COCAINE(METAB.)SCREEN, URINE: NEGATIVE
FENTANYL SCREEN, URINE: NEGATIVE
METHADONE SCREEN, URINE: NEGATIVE
OPIATE SCREEN URINE: NEGATIVE
OXYCODONE SCREEN URINE: NEGATIVE

## 2023-05-03 LAB — ETHANOL: ETHANOL: <10 mg/dL (ref <=10)

## 2023-05-03 LAB — BASIC METABOLIC PANEL
ANION GAP: 11 mmol/L (ref 5–14)
BLOOD UREA NITROGEN: 28 mg/dL — ABNORMAL HIGH (ref 9–23)
BUN / CREAT RATIO: 16
CALCIUM: 9.5 mg/dL (ref 8.7–10.4)
CHLORIDE: 101 mmol/L (ref 98–107)
CO2: 23 mmol/L (ref 20.0–31.0)
CREATININE: 1.79 mg/dL — ABNORMAL HIGH (ref 0.73–1.18)
EGFR CKD-EPI (2021) MALE: 49 mL/min/{1.73_m2} — ABNORMAL LOW (ref >=60)
GLUCOSE RANDOM: 106 mg/dL (ref 70–179)
POTASSIUM: 4.4 mmol/L (ref 3.4–4.8)
SODIUM: 135 mmol/L (ref 135–145)

## 2023-05-03 LAB — HIGH SENSITIVITY TROPONIN I - SINGLE
HIGH SENSITIVITY TROPONIN I: 4 ng/L (ref <=53)
HIGH SENSITIVITY TROPONIN I: 3 ng/L (ref <=53)

## 2023-05-03 LAB — TSH: THYROID STIMULATING HORMONE: 2.626 u[IU]/mL (ref 0.550–4.780)

## 2023-05-03 MED ADMIN — acetaminophen (TYLENOL) tablet 650 mg: 650 mg | ORAL | @ 23:00:00 | Stop: 2023-05-03

## 2023-05-03 MED ADMIN — lidocaine (ASPERCREME) 4 % 1 patch: 1 | TRANSDERMAL | @ 23:00:00 | Stop: 2023-05-03

## 2023-05-03 MED ADMIN — oxyCODONE (ROXICODONE) immediate release tablet 5 mg: 5 mg | ORAL | @ 19:00:00 | Stop: 2023-05-03

## 2023-05-03 NOTE — Unmapped (Signed)
[facility] Emergency Department Provider Note         ED Clinical Impression     Final diagnoses:   Kidney transplant recipient (Primary)       Presenting History     HPI    May 03, 2023 1:40 PM   Ryan Moon is a 37 y.o. male with history of ANCA vasculitis, kidney transplant in 2020 presenting with prolonged cough.  Patient began feeling ill last Thursday, notes his whole family was sick with a cough runny nose.  At that time he also had a cough and runny nose.  He has had fevers at home, and tested positive for COVID on November 25.  He has been taking guaifenesin with hydrocodone and Tessalon Perles in addition to a medication for COVID.  He is not sure what it is.  His main complaint at this time is continued cough with associated shortness of breath, general malaise and myalgias that is gone on for a little over a week at this time.  He does note that the cough induces chest pain, that is otherwise not present.    Wife at the bedside corroborates the history and further states the patient had some slurring of the words on Wednesday that was short-lived, also experiencing some amnesia at that time.  Generally delirious.    Patient does deny any dysuria, rash, neck stiffness, headache.  He further states he has been taking his medications including the immunosuppressant, belatacept which is an infusion monthly.    Physical Exam and MDM     BP 128/85  - Pulse 79  - Temp 37.2 ??C (99 ??F)  - Resp 22  - SpO2 93%     General: Awake, alert and orientedx3, in no distress, does appear generally unwell.  Skin: Skin is warm and dry.  Lungs: Normal respiratory effort. Breath sounds clear bilaterally. +Cough  Heart: Regular rhythm, normal heart sounds, no JVD.  Chest Wall: No rash, no crepitus.  Abdomen: Soft , non-distended, and non-tender to palpation.  Musculoskeletal: No deformities or tenderness.  Extremities: No peripheral edema, 2+ radial pulses symmetric bilaterally.  Neurological: Equal, symmetric strength in the upper and lower extremities.  No sensory deficits.  Symmetric face shoulder muscles.  Pupils PERRL.  Normal speech.  Psychiatric: Normal affect and behavior for situation      MDM: Ryan Moon is 37 y.o. male who is immunosuppressed and is presenting with 1 week of cough, runny nose, diagnosed with COVID. Differential diagnosis includes bronchitis, pneumonia, malignancy, ACS among other etiologies.  His slurred speech and amnesia resolved on their own, he is noted to take benzodiazepines at home, could be related to this.  Overall likely he was delirious in the setting of acute illness.  Low concern for sepsis at this time given reassuring vital signs.  Patient neurologically intact without evidence of stroke.    Diagnostic workup as below.     Orders Placed This Encounter   Procedures    CBC w/ Differential    Basic Metabolic Panel    Hepatic Function Panel    Toxicology Screen, Urine    Urinalysis with Microscopy with Culture Reflex    Ethanol    TSH    hsTroponin I (single, no delta)    Blood Gas Critical Care Panel, Venous    Special Airborne/Contact Isolation Status    ECG 12 Lead    Insert peripheral IV       Patient Treated with:  Medications  oxyCODONE (ROXICODONE) immediate release tablet 5 mg (has no administration in time range)   Treated with oxycodone for cough, musculoskeletal chest pain induced by cough.      Will reassess as we get results and update below    ED Course as of 05/06/23 0827   Fri May 03, 2023   1316 Creatinine(!): 1.79  At baseline   1316 hsTroponin I: 4   1317 SGOT (AST)(!): 78   1317 ALT(!): 66   1317 ECG 12 Lead  My independent interpretation:     Normal rate, sinus rhythm  Normal axis  QRS, PR, QTc intervals are all within normal limits  No ST segment elevations or depressions  No evidence of ischemia or infarct   1422 WBC(!): 3.4                 Discussion of Management with other Physicians, QHP, or Appropriate Source:  See ED course above for documentation of pages sent, and discussions with collaborating professionals.  Independent Interpretation of Studies: See ED Course above for my independent reviews.   External Records Reviewed: as available  If Patient Not Admitted--Escalation of Care, Consideration of Admission/Observation/Transfer:   Social determinants that significantly affected care: In addition to as noted previously above: None  Prescription drug(s) considered but not prescribed: In addition to as noted previously above: None  Diagnostic tests considered but not performed: In addition to as noted previously above: None              _____________________________________________________________________    The case was discussed with attending physician who is in agreement with the above assessment and plan    Additional Medical Decision Making     I have reviewed the vital signs and the nursing notes. Labs and radiology results that were available during my care of the patient were independently reviewed by me and considered in my medical decision making.   I independently visualized the EKG tracing if performed  I independently visualized the radiology images if performed  I reviewed the patient's prior medical records if available.  Additional history obtained from family if available.    If patient prefers a language other than English, I have used an interpreter or interpreting service for our interactions unless directed otherwise by the patient.    Other History     CHIEF COMPLAINT:   Chief Complaint   Patient presents with    Chest Pain    Cough    Shortness of Breath       PAST MEDICAL HISTORY/PAST SURGICAL HISTORY:   Past Medical History:   Diagnosis Date    Renal vasculitis (CMS-HCC)     Secondary hyperparathyroidism (CMS-HCC)        Past Surgical History:   Procedure Laterality Date    APPENDECTOMY  2013    NEPHRECTOMY TRANSPLANTED ORGAN      PR COLSC FLX W/RMVL OF TUMOR POLYP LESION SNARE TQ N/A 07/07/2021    Procedure: COLONOSCOPY FLEX; W/REMOV TUMOR/LES BY SNARE;  Surgeon: Maris Berger, MD;  Location: [facility];  Service: Gastroenterology    PR TRANSPLANT,PREP CADAVER RENAL GRAFT Right 04/28/2019    Procedure: [facility] STD PREP CAD DONR RENAL ALLOGFT PRIOR TO TRNSPLNT, INCL DISSEC/REM PERINEPH FAT, DIAPH/RTPER ATTAC;  Surgeon: Doyce Loose, MD;  Location: MAIN OR [place];  Service: Transplant    PR TRANSPLANTATION OF KIDNEY Right 04/28/2019    Procedure: RENAL ALLOTRANSPLANTATION, IMPLANTATION OF GRAFT; WITHOUT RECIPIENT NEPHRECTOMY;  Surgeon: Doyce Loose, MD;  Location: MAIN OR [facility];  Service: Transplant       MEDICATIONS:   No current facility-administered medications for this encounter.    Current Outpatient Medications:     ALPRAZolam (XANAX) 0.5 MG tablet, Take 1 tablet (0.5 mg total) by mouth two (2) times a day as needed for anxiety., Disp: 10 tablet, Rfl: 0    belatacept (NULOJIX) 250 mg SolR, Belatacept  5 mg/kg every 2 weeks for 5 doses then once every 4 weeks (Patient taking differently: Infuse 5 mg/kg into a venous catheter every twenty-eight (28) days.), Disp: 14.5 mL, Rfl: 0    empagliflozin (JARDIANCE) 10 mg tablet, Take 1 tablet (10 mg total) by mouth daily., Disp: 30 tablet, Rfl: 11    losartan (COZAAR) 50 MG tablet, Take 1 tablet (50 mg total) by mouth two (2) times a day., Disp: 180 tablet, Rfl: 3    magnesium oxide-Mg AA chelate (MAGNESIUM, AMINO ACID CHELATE,) 133 mg, Take 1 tablet by mouth daily., Disp: 90 tablet, Rfl: 3    omeprazole (PRILOSEC) 20 MG capsule, Take 1 capsule (20 mg total) by mouth daily., Disp: 30 capsule, Rfl: 11    ondansetron (ZOFRAN-ODT) 4 MG disintegrating tablet, Take 1 tablet (4 mg total) by mouth every eight (8) hours as needed., Disp: 30 tablet, Rfl: 0    predniSONE (DELTASONE) 5 MG tablet, Take 1 tablet (5 mg total) by mouth daily., Disp: 90 tablet, Rfl: 3    Facility-Administered Medications Ordered in Other Encounters:     albuterol 2.5 mg /3 mL (0.083 %) nebulizer solution 2.5 mg, 2.5 mg, Nebulization, Once PRN, Chargualaf, Lorel Monaco, CPP    albuterol 2.5 mg /3 mL (0.083 %) nebulizer solution 2.5 mg, 2.5 mg, Nebulization, Once PRN, Chargualaf, Lorel Monaco, CPP    albuterol 2.5 mg /3 mL (0.083 %) nebulizer solution 2.5 mg, 2.5 mg, Nebulization, Once PRN, Chargualaf, Lorel Monaco, CPP    pentamidine (PENTAM) inhalation solution, 300 mg, Inhalation, Once, Chargualaf, Lorel Monaco, CPP    ALLERGIES:   Ibuprofen and Acetaminophen    SOCIAL HISTORY:   Social History     Tobacco Use    Smoking status: Former     Current packs/day: 0.00     Types: Cigarettes     Quit date: 05/22/2018     Years since quitting: 4.9    Smokeless tobacco: Former     Types: Chew   Substance Use Topics    Alcohol use: Yes     Alcohol/week: 1.0 standard drink of alcohol     Types: 1 Cans of beer per week     Comment: Patient had a DUI at age 21. Until 5/16 patient would have 1-2 beers per day.        FAMILY HISTORY:  Family History   Problem Relation Age of Onset    Kidney disease Neg Hx           Review of Systems    A 10 point review of systems was performed and is negative other than positive elements noted in HPI     Radiology     No orders to display       Labs     Labs Reviewed   BASIC METABOLIC PANEL - Abnormal; Notable for the following components:       Result Value    BUN 28 (*)     Creatinine 1.79 (*)     eGFR CKD-EPI (2021) Male 30 (*)     All other components within  normal limits   HEPATIC FUNCTION PANEL - Abnormal; Notable for the following components:    AST 78 (*)     ALT 66 (*)     Alkaline Phosphatase 206 (*)     All other components within normal limits   CBC W/ AUTO DIFF - Abnormal; Notable for the following components:    WBC 3.4 (*)     RBC 4.09 (*)     HCT 36.6 (*)     All other components within normal limits    Narrative:     WBC, PLT and/or Differential requires smear review. Expected TAT is 2 hours.   URINALYSIS WITH MICROSCOPY WITH CULTURE REFLEX PERFORMABLE - Abnormal; Notable for the following components:    Protein, UA 30 mg/dL (*)     Blood, UA Moderate (*)     RBC, UA 5 (*)     All other components within normal limits   ETHANOL - Normal    Narrative:     Testing for medical purposes only.   TSH - Normal   HIGH SENSITIVITY TROPONIN I - SINGLE - Normal   URINALYSIS WITH MICROSCOPY WITH CULTURE REFLEX    Narrative:     The following orders were created for panel order Urinalysis with Microscopy with Culture Reflex.                  Procedure                               Abnormality         Status                                     ---------                               -----------         ------                                     Urinalysis with Microsc.Marland KitchenMarland Kitchen[[id number]]  Abnormal            Final result                                                 Please view results for these tests on the individual orders.   BLOOD GAS CRITICAL CARE PANEL, VENOUS   CBC W/ DIFFERENTIAL    Narrative:     The following orders were created for panel order CBC w/ Differential.                  Procedure                               Abnormality         Status                                     ---------                               -----------         ------  CBC w/ Differential[[id number]]         Abnormal            Preliminary result                                           Please view results for these tests on the individual orders.   TOXICOLOGY SCREEN, URINE       Please note- This chart has been created using [employer]. Chart creation errors have been sought, but may not always be located and such creation errors, especially pronoun confusion, do NOT reflect on the standard of medical care.     Earlene Plater, MD  Resident  05/06/23 [phone]

## 2023-05-03 NOTE — Unmapped (Addendum)
Pt presents to ED with complaints of cough, sharp chest pain, SOB, and forgetfulness after being dx with Covid on 25 Nov. Pt reports worsening sx. Wife also states he has had slurred speech for the past couple of days -- this RN did not appreciate slurred speech at NF. Had kidney transplant in 2020 and reports he is unable to take normal meds Rx for Covid.

## 2023-05-04 LAB — BASIC METABOLIC PANEL
ANION GAP: 12 mmol/L (ref 5–14)
BLOOD UREA NITROGEN: 24 mg/dL — ABNORMAL HIGH (ref 9–23)
BUN / CREAT RATIO: 14
CALCIUM: 9.5 mg/dL (ref 8.7–10.4)
CHLORIDE: 102 mmol/L (ref 98–107)
CO2: 22 mmol/L (ref 20.0–31.0)
CREATININE: 1.67 mg/dL — ABNORMAL HIGH (ref 0.73–1.18)
EGFR CKD-EPI (2021) MALE: 53 mL/min/{1.73_m2} — ABNORMAL LOW (ref >=60)
GLUCOSE RANDOM: 101 mg/dL (ref 70–179)
POTASSIUM: 4.2 mmol/L (ref 3.4–4.8)
SODIUM: 136 mmol/L (ref 135–145)

## 2023-05-04 LAB — MAGNESIUM: MAGNESIUM: 2.1 mg/dL (ref 1.6–2.6)

## 2023-05-04 LAB — CBC
HEMATOCRIT: 35.1 % — ABNORMAL LOW (ref 39.0–48.0)
HEMOGLOBIN: 12.1 g/dL — ABNORMAL LOW (ref 12.9–16.5)
MEAN CORPUSCULAR HEMOGLOBIN CONC: 34.4 g/dL (ref 32.0–36.0)
MEAN CORPUSCULAR HEMOGLOBIN: 31.5 pg (ref 25.9–32.4)
MEAN CORPUSCULAR VOLUME: 91.7 fL (ref 77.6–95.7)
MEAN PLATELET VOLUME: 7.3 fL (ref 6.8–10.7)
PLATELET COUNT: 257 10*9/L (ref 150–450)
RED BLOOD CELL COUNT: 3.83 10*12/L — ABNORMAL LOW (ref 4.26–5.60)
RED CELL DISTRIBUTION WIDTH: 13.7 % (ref 12.2–15.2)
WBC ADJUSTED: 3.3 10*9/L — ABNORMAL LOW (ref 3.6–11.2)

## 2023-05-04 LAB — HEPATIC FUNCTION PANEL
ALBUMIN: 3.7 g/dL (ref 3.4–5.0)
ALKALINE PHOSPHATASE: 171 U/L — ABNORMAL HIGH (ref 46–116)
ALT (SGPT): 48 U/L (ref 10–49)
AST (SGOT): 36 U/L — ABNORMAL HIGH (ref <=34)
BILIRUBIN DIRECT: <0.1 mg/dL (ref 0.00–0.30)
BILIRUBIN TOTAL: 0.3 mg/dL (ref 0.3–1.2)
PROTEIN TOTAL: 7.1 g/dL (ref 5.7–8.2)

## 2023-05-04 LAB — C-REACTIVE PROTEIN: C-REACTIVE PROTEIN: 102 mg/L — ABNORMAL HIGH (ref <=10.0)

## 2023-05-04 MED ADMIN — albuterol 2.5 mg /3 mL (0.083 %) nebulizer solution 2.5 mg: 2.5 mg | RESPIRATORY_TRACT | @ 17:00:00

## 2023-05-04 MED ADMIN — lidocaine (ASPERCREME) 4 % 2 patch: 2 | TRANSDERMAL | @ 04:00:00

## 2023-05-04 MED ADMIN — albuterol 2.5 mg /3 mL (0.083 %) nebulizer solution 2.5 mg: 2.5 mg | RESPIRATORY_TRACT | @ 21:00:00

## 2023-05-04 MED ADMIN — HYDROmorphone (DILAUDID) tablet 1 mg: 1 mg | ORAL | @ 21:00:00 | Stop: 2023-05-18

## 2023-05-04 MED ADMIN — menthol (COUGH DROPS) lozenge 1 lozenge: 1 | ORAL | @ 17:00:00

## 2023-05-04 MED ADMIN — albuterol 2.5 mg /3 mL (0.083 %) nebulizer solution 2.5 mg: 2.5 mg | RESPIRATORY_TRACT | @ 14:00:00

## 2023-05-04 MED ADMIN — empagliflozin (JARDIANCE) tablet 10 mg: 10 mg | ORAL | @ 14:00:00

## 2023-05-04 MED ADMIN — menthol (COUGH DROPS) lozenge 1 lozenge: 1 | ORAL | @ 08:00:00

## 2023-05-04 MED ADMIN — ALPRAZolam (XANAX) tablet 0.5 mg: .5 mg | ORAL | @ 14:00:00

## 2023-05-04 MED ADMIN — pantoprazole (Protonix) EC tablet 40 mg: 40 mg | ORAL | @ 14:00:00

## 2023-05-04 MED ADMIN — dextromethorphan (DELSYM) ER oral suspension: 60 mg | ORAL | @ 04:00:00

## 2023-05-04 MED ADMIN — oxyCODONE (ROXICODONE) immediate release tablet 5 mg: 5 mg | ORAL | @ 06:00:00 | Stop: 2023-05-04

## 2023-05-04 MED ADMIN — menthol (COUGH DROPS) lozenge 1 lozenge: 1 | ORAL | @ 09:00:00

## 2023-05-04 MED ADMIN — azithromycin (ZITHROMAX) tablet 500 mg: 500 mg | ORAL | @ 04:00:00 | Stop: 2023-05-03

## 2023-05-04 MED ADMIN — guaiFENesin (HUMIBID 3) tablet 400 mg: 400 mg | ORAL | @ 08:00:00 | Stop: 2023-05-04

## 2023-05-04 MED ADMIN — oxyCODONE (ROXICODONE) immediate release tablet 5 mg: 5 mg | ORAL | @ 02:00:00 | Stop: 2023-05-17

## 2023-05-04 MED ADMIN — predniSONE (DELTASONE) tablet 5 mg: 5 mg | ORAL | @ 14:00:00

## 2023-05-04 MED ADMIN — dextromethorphan-guaiFENesin (ROBITUSSIN-DM) 2-20 mg/mL oral syrup: 5 mL | ORAL | @ 09:00:00 | Stop: 2023-05-04

## 2023-05-04 MED ADMIN — guaiFENesin (ROBITUSSIN) oral syrup: 200 mg | ORAL | @ 17:00:00

## 2023-05-04 MED ADMIN — oxyCODONE (ROXICODONE) immediate release tablet 5 mg: 5 mg | ORAL | @ 17:00:00 | Stop: 2023-05-04

## 2023-05-04 MED ADMIN — guaiFENesin (ROBITUSSIN) oral syrup: 200 mg | ORAL | @ 21:00:00

## 2023-05-04 MED ADMIN — menthol (COUGH DROPS) lozenge 1 lozenge: 1 | ORAL | @ 14:00:00

## 2023-05-04 MED ADMIN — oxyCODONE (ROXICODONE) immediate release tablet 5 mg: 5 mg | ORAL | @ 14:00:00 | Stop: 2023-05-04

## 2023-05-04 MED ADMIN — losartan (COZAAR) tablet 50 mg: 50 mg | ORAL | @ 14:00:00

## 2023-05-04 NOTE — Unmapped (Signed)
[facility] ED Provider Progress Note      ED FINAL IMPRESSION:     Final diagnoses:   Dyspnea, unspecified type   Chest pain, unspecified type       ENCOUNTER SUMMARY & PLAN:     TIME OF CARE ASSUMED: May 04, 2023 1:03 AM    PREVIOUS PROVIDER: Aldine Contes MD    Please see below for encounter summary as well as pending items.      ED COURSE:     Vitals:    05/03/23 1900 05/03/23 1931 05/03/23 2105 05/03/23 2222   BP: 127/83   145/99   Pulse:    77   Resp:    16   Temp:  36.8 ??C (98.3 ??F)  36.7 ??C (98.1 ??F)   TempSrc:  Oral  Oral   SpO2:  97%  94%   Weight:   88.5 kg (195 lb)    Height:   182.9 cm (6')        ED Course as of 05/04/23 0106   Fri May 03, 2023   1505 37 YO HX of kidney transplant in 2020, here with 8 days COVID onset of symptoms roughly 1 week ago + Monday. Here with worsening cough, chest pain with coughing, trialed supportive care. Paged for admission. Re-evaluate, CXR Read.   1615 IMPRESSION:     No acute cardiopulmonary abnormalities.      I went to discuss and evaluate the patient.  He reports to me he has had the symptoms for 7 days-tested COVID-positive at home.  Reports to me since then he feels constant-as well as worsening cough.  His pain is not necessarily pleuritic however it is worse with significant coughing.  Has been trying Tylenol at home-he is not a candidate for NSAIDs.  Reports the oxycodone he was given has helped attenuated but still has a significantly.  I discussed with him through shared decision making-he would like to try another round of pain medications including Tylenol and a lidocaine patch, in addition to oxycodone he has on board.  Will also ambulate with oxygen saturation to see if he desaturates-unclear if long COVID.    1753 Patient became significantly dyspneic during walking.  Oxygenating greater than 94%.  Required some time to improve.  Will page out for suspected long COVID-I discussed with Dr. Cathey Endow out the patient has been taking her medication antiviral for COVID however continues to have symptoms.  While not hypoxic has quite dyspnea on exertion.  I will let him evaluate the patient.  Sent an RPP as I thought 1 had previously been send but has not.  His troponin is reassuring.         Final care and disposition discussed with Dr. Matilde Haymaker who is in agreement with the plan.    Dorena Cookey, MD  [facility] Emergency Medicine

## 2023-05-04 NOTE — Unmapped (Addendum)
Hospital Medicine Daily Progress Note    Assessment/Plan:    Principal Problem:    Productive cough  Active Problems:    Kidney transplant recipient    Immunosuppression (CMS-HCC)    Essential hypertension    GERD (gastroesophageal reflux disease)    Stage 3a chronic kidney disease (CMS-HCC)    Persistent adjustment disorder with mixed anxiety and depressed mood                 Ryan Moon is a 37 y.o. male that presented to [facility] with Productive cough.    Productive cough with chest pain concerning for bacterial bronchitis vs pneumonia: Patient presented with 2 weeks of worsening cough, low grade fevers, fatigue, and associated chest pain. Patient was found to be COVID positive at urgent care on 11/25, and was prescribed molnupiravir. Interesting, patient has been COVID negative x 2 since presentation. Thus, suspect patient may not have actually had COVID. RPP negative. CXR with bibasilar airspace disease which may represent atelectasis vs infection. Patient likely with bacterial bronchitis vs pneumonia.   - Consulted ICID, and discussed with fellow. Recommends to treat for pneumonia at this point with azithromycin, and further work up with sputum studies including AFB, legionella, mycoplasma PCR. Recommends CT chest.   - Continue azithromycin for now, however low threshold to add ceftriaxone  - Check MRSA nasal swab - and if positive, will change abx to linezolid   - Continue scheduled albuterol neds  - Start scheduled guaifenesin RTC for pulmnary hygiene   - Ordered sputum culture  - Incentive spirometer  - Can cautiously trial hycodan however counseled patient we do not want to suppress cough - but may help him sleep  - Start Dilaudid PO PRN pain for now - and discontinue PRN oxycodone  - Will discontinue COVID precautions as COVID negative x 2      Secondary/Additional Active Problems:  History of kidney transplant I CKD stage 3: Baseline Cr ranges between 2 and 1.4. Cr is currently at baseline. Due for monthly belatacept on Wednesday of next week.  - Continue home prednisone 5 mg  - Continue home Jardiance and losartan     Essential hypertension: BP at goal.  - Continue home losartan  - Continue to monitor BP     GERD (gastroesophageal reflux disease):  - Continue formulary substitute for home PPI     Persistent adjustment disorder with mixed anxiety and depressed mood:  - Continue home Xanax     DVT Prophylaxis: Enoxaparin     Diet: Nutrition Therapy Regular/House     Code Status / HCDM   - Full Code, Unverified but presumed, based on previous history   - HCDM (patient stated preference): [name],[name] - Spouse - [phone]    I personally spent 50 minutes face-to-face and non-face-to-face in the care of this patient, which includes all pre, intra, and post visit time on the date of service.  All documented time was specific to the E/M visit and does not include any procedures that may have been performed.  ___________________________________________________________________    Subjective:  Patient seen and examined at bedside at midday. No acute events overnight. Patient reports he is still having significant issue with cough and chest pain. Reports his cough is productive with thick green sputum. Patient reports he has significant chest pain with coughing. Denies fever, chills, nausea, vomiting, abdominal pain, LE swelling or pain. Reports he rode for 6 miles 2 days prior to getting sick. Patient reports his positive COVID  test was at an urgent care on Monday. Reports he has been unable to sleep for prolonged periods due to cough. Reports he feels like breathing treatments are helping.     Labs/Studies:  Labs and Studies from the last 24hrs per EMR and Reviewed  Labs significant for: WBC 3.3, H/H 12.1/35.1, platelets 257, Na 136, K 4.2, BUN/Cr 24/1.67, ALT/AST 48/36, alk phos 171, CRP 102.     Objective:  Temp:  [36.7 ??C (98.1 ??F)-37 ??C (98.6 ??F)] 36.7 ??C (98.1 ??F)  Heart Rate:  [75-79] 78  SpO2 Pulse:  [68-75] 68  Resp:  [16-22] 18  BP: (111-145)/(72-99) 111/72  SpO2:  [90 %-97 %] 96 %    GEN: NAD, lying in bed, nontoxic appearing  CV: RRR, normal S1/S2  PULM: CTA B, no rhonchi or wheeze, no increased WOB  ABD: soft, NT/ND, +BS  EXT: No pitting edema

## 2023-05-04 NOTE — Unmapped (Addendum)
[facility] Medicine   History and Physical       Assessment and Plan     Ryan Moon is a 37 y.o. male who is presenting to [facility] with COVID-19, in the setting of the following pertinent/contributing co-morbidities: Renal transplant, CKD.      COVID-19 in the setting of kidney transplant status with immunosuppression and progression of symptoms with oral molnupiravir  Illness that poses a threat to life or bodily function without appropriate treatment  Positive COVID-19 on November 25 with prescription of molnupiravir.  Now with continued cough and significant dyspnea with risk factors of immunosuppression.  Reassuringly, no hypoxia at rest did have some desaturation while sleeping.    -Full respiratory panel pending  -Beginning remdesivir  -Holding off on additional steroids given no hypoxia  -Incentive spirometry  -lidocaine patch    ADDENDUM   Followed up on RPP and panel was negative. CXR with atelectasis vs infection and given course with worsening and RPP negative, now considering post viral bilat PNA -- will start azithromycin and stop remdesivir. Also ordering sputum cx.      Secondary/Additional Active Problems:      Kidney transplant recipient  Creatinine seems to range between 2 and 1.4.  Creatinine today 1.79.  Due for monthly belatacept on Wednesday of next week.  -Continuing home steroid at 5 mg      Essential hypertension  -Continuing losartan      GERD (gastroesophageal reflux disease)  -Continuing PPI      Stage 3a chronic kidney disease (CMS-HCC)  -Continuing Jardiance and losartan as above      Persistent adjustment disorder with mixed anxiety and depressed mood  -Continuing home Xanax    Prophylaxis  -Enoxaparin given hypercoagulable state with COVID-19    Diet  -Nutrition Therapy Regular/House    Code Status / HCDM   -Full Code, Unverified but presumed, based on previous history   -  HCDM (patient stated preference): Marsan,[name] - Spouse - [phone]    Anticipated Medically Ready for Discharge: Anticipated in 2-4 Days    Significant Comorbid Conditions:  -Chronic kidney disease POA requiring further investigation, treatment, or monitoring    Issues Impacting Complexity of Management:      Medical Decision Making: Reviewed records from the following unique sources EMR. Independently interpreted chest x-ray notable for atelectasis. Discussed the patient's management and/or test interpretation with ED Provider as summarized within this note      HPI      Ryan Moon is a 37 y.o. male who is presenting to [facility] with COVID-19 in the setting of renal transplant    Patient reports 2 weeks of congestion with worsening cough and low-grade fevers with associated fatigue and malaise.  Had sick contacts with his children in the household 1 week previous.  On the Monday prior to admission, patient tested positive for COVID was started on molnupiravir.  Patient reports that his cough and dyspnea on exertion have continued to worsen to the point where his activities are limited.  He denies any syncope or presyncope.  He reports significant musculoskeletal pain with coughing.  Overall, patient is concerned about possible progression to chronic symptoms with COVID and concerned that he is not improving on molnupiravir.    No lower extremity swelling.  Cough unproductive.  Some nausea but no emesis.  No constipation        Med Rec Confidence   I reviewed the Medication List. The current list is Accurate  Physical Exam   Temp:  [36.8 ??C (98.3 ??F)-37.2 ??C (99 ??F)] 36.8 ??C (98.3 ??F)  Heart Rate:  [75-87] 75  SpO2 Pulse:  [68-79] 68  Resp:  [16-24] 22  BP: (124-143)/(78-85) 127/83  SpO2:  [93 %-97 %] 97 %  Body mass index is 26.45 kg/m??.  GEN: NAD, lying in bed  EYES: EOMI  ENT: MMM  CV: RRR, no murmurs appreciated  PULM: Coarse breath sounds but no crackles and no wheezes.  Good air movement.  Frequent painful cough  ABD: soft, NT/ND, +BS  EXT: No edema  NEURO: No focal deficits  PSYCH: A+Ox3, appropriate    ___________________________________________________________________    Medications     Prior to Admission medications    Medication Dose, Route, Frequency   ALPRAZolam (XANAX) 0.5 MG tablet 0.5 mg, Oral, 2 times a day PRN   belatacept (NULOJIX) 250 mg SolR Belatacept  5 mg/kg every 2 weeks for 5 doses then once every 4 weeks  Patient taking differently: Infuse 5 mg/kg into a venous catheter every twenty-eight (28) days.   benzonatate (TESSALON) 200 MG capsule 200 mg, Oral, 3 times a day PRN   empagliflozin (JARDIANCE) 10 mg tablet 10 mg, Oral, Daily (standard)   losartan (COZAAR) 50 MG tablet 50 mg, Oral, 2 times a day (standard)   magnesium oxide-Mg AA chelate (MAGNESIUM, AMINO ACID CHELATE,) 133 mg Take 1 tablet by mouth daily.   molnupiravir, EUA, (LAGEVRIO, EUA,) 200 mg capsule 800 mg, Oral, Every 12 hours   omeprazole (PRILOSEC) 20 MG capsule 20 mg, Oral, Daily (standard)   ondansetron (ZOFRAN-ODT) 4 MG disintegrating tablet 4 mg, Oral, Every 8 hours PRN   predniSONE (DELTASONE) 5 MG tablet 5 mg, Oral, Daily (standard)       Allergies   Ibuprofen and Acetaminophen     Medical History     Past Medical History:   Diagnosis Date    Renal vasculitis (CMS-HCC)     Secondary hyperparathyroidism (CMS-HCC)        Social History   Tobacco use:   reports that he quit smoking about 4 years ago. His smoking use included cigarettes. He has quit using smokeless tobacco.  His smokeless tobacco use included chew.  Alcohol use:   reports current alcohol use of about 1.0 standard drink of alcohol per week.  Drug use:  reports current drug use. Drug: Marijuana.    Family History     Family History   Problem Relation Age of Onset    Kidney disease Neg Hx        Surgical History     Past Surgical History:   Procedure Laterality Date    APPENDECTOMY  2013    NEPHRECTOMY TRANSPLANTED ORGAN      PR COLSC FLX W/RMVL OF TUMOR POLYP LESION SNARE TQ N/A 07/07/2021    Procedure: COLONOSCOPY FLEX; W/REMOV TUMOR/LES BY SNARE; Surgeon: Maris Berger, MD;  [facility];  Service: Gastroenterology    PR TRANSPLANT,PREP CADAVER RENAL GRAFT Right 04/28/2019    Procedure: [facility] STD PREP CAD DONR RENAL ALLOGFT PRIOR TO TRNSPLNT, INCL DISSEC/REM PERINEPH FAT, DIAPH/RTPER ATTAC;  Surgeon: Doyce Loose, MD;  Location: MAIN OR [place];  Service: Transplant    PR TRANSPLANTATION OF KIDNEY Right 04/28/2019    Procedure: RENAL ALLOTRANSPLANTATION, IMPLANTATION OF GRAFT; WITHOUT RECIPIENT NEPHRECTOMY;  Surgeon: Doyce Loose, MD;  Location: MAIN OR [facility];  Service: Transplant

## 2023-05-05 DIAGNOSIS — I7782 ANCA-associated vasculitis (CMS-HCC): Principal | ICD-10-CM

## 2023-05-05 DIAGNOSIS — Z940 Kidney transplant status: Principal | ICD-10-CM

## 2023-05-05 LAB — CRYPTOCOCCAL ANTIGEN, SERUM: CRYPTOCOCCAL ANTIGEN: NEGATIVE

## 2023-05-05 LAB — CBC
HEMATOCRIT: 36.7 % — ABNORMAL LOW (ref 39.0–48.0)
HEMOGLOBIN: 12.6 g/dL — ABNORMAL LOW (ref 12.9–16.5)
MEAN CORPUSCULAR HEMOGLOBIN CONC: 34.2 g/dL (ref 32.0–36.0)
MEAN CORPUSCULAR HEMOGLOBIN: 32 pg (ref 25.9–32.4)
MEAN CORPUSCULAR VOLUME: 93.6 fL (ref 77.6–95.7)
MEAN PLATELET VOLUME: 7.2 fL (ref 6.8–10.7)
PLATELET COUNT: 260 10*9/L (ref 150–450)
RED BLOOD CELL COUNT: 3.92 10*12/L — ABNORMAL LOW (ref 4.26–5.60)
RED CELL DISTRIBUTION WIDTH: 13.9 % (ref 12.2–15.2)
WBC ADJUSTED: 3.2 10*9/L — ABNORMAL LOW (ref 3.6–11.2)

## 2023-05-05 LAB — HEPATIC FUNCTION PANEL
ALBUMIN: 3.9 g/dL (ref 3.4–5.0)
ALKALINE PHOSPHATASE: 160 U/L — ABNORMAL HIGH (ref 46–116)
ALT (SGPT): 44 U/L (ref 10–49)
AST (SGOT): 33 U/L (ref <=34)
BILIRUBIN DIRECT: <0.1 mg/dL (ref 0.00–0.30)
BILIRUBIN TOTAL: 0.3 mg/dL (ref 0.3–1.2)
PROTEIN TOTAL: 7.6 g/dL (ref 5.7–8.2)

## 2023-05-05 LAB — SLIDE REVIEW

## 2023-05-05 LAB — ADDON DIFFERENTIAL ONLY
BASOPHILS ABSOLUTE COUNT: 0 10*9/L (ref 0.0–0.1)
BASOPHILS RELATIVE PERCENT: 0.7 %
EOSINOPHILS ABSOLUTE COUNT: 0.1 10*9/L (ref 0.0–0.5)
EOSINOPHILS RELATIVE PERCENT: 4 %
LYMPHOCYTES ABSOLUTE COUNT: 0.8 10*9/L — ABNORMAL LOW (ref 1.1–3.6)
LYMPHOCYTES RELATIVE PERCENT: 24.2 %
MONOCYTES ABSOLUTE COUNT: 0.7 10*9/L (ref 0.3–0.8)
MONOCYTES RELATIVE PERCENT: 22.7 %
NEUTROPHILS ABSOLUTE COUNT: 1.5 10*9/L — ABNORMAL LOW (ref 1.8–7.8)
NEUTROPHILS RELATIVE PERCENT: 48.4 %

## 2023-05-05 LAB — BASIC METABOLIC PANEL
ANION GAP: 13 mmol/L (ref 5–14)
BLOOD UREA NITROGEN: 18 mg/dL (ref 9–23)
BUN / CREAT RATIO: 10
CALCIUM: 9.6 mg/dL (ref 8.7–10.4)
CHLORIDE: 103 mmol/L (ref 98–107)
CO2: 22 mmol/L (ref 20.0–31.0)
CREATININE: 1.78 mg/dL — ABNORMAL HIGH (ref 0.73–1.18)
EGFR CKD-EPI (2021) MALE: 49 mL/min/{1.73_m2} — ABNORMAL LOW (ref >=60)
GLUCOSE RANDOM: 102 mg/dL (ref 70–179)
POTASSIUM: 3.9 mmol/L (ref 3.4–4.8)
SODIUM: 138 mmol/L (ref 135–145)

## 2023-05-05 LAB — LEGIONELLA ANTIGEN, URINE: LEGIONELLA, URINARY ANTIGEN: NEGATIVE

## 2023-05-05 LAB — MAGNESIUM: MAGNESIUM: 2.2 mg/dL (ref 1.6–2.6)

## 2023-05-05 MED ORDER — HYDROMORPHONE 2 MG TABLET
ORAL_TABLET | ORAL | 0 refills | 2 days | Status: CP | PRN
Start: 2023-05-05 — End: 2023-05-08
  Filled 2023-05-05: qty 10, 2d supply, fill #0

## 2023-05-05 MED ORDER — DOXYCYCLINE HYCLATE 100 MG TABLET
ORAL_TABLET | Freq: Two times a day (BID) | ORAL | 0 refills | 6 days | Status: CP
Start: 2023-05-05 — End: 2023-05-11
  Filled 2023-05-05: qty 12, 6d supply, fill #0

## 2023-05-05 MED ORDER — AZITHROMYCIN 250 MG TABLET
ORAL_TABLET | Freq: Every day | ORAL | 0 refills | 3 days | Status: CP
Start: 2023-05-05 — End: 2023-05-05

## 2023-05-05 MED ORDER — LIDOCAINE 4 % TOPICAL PATCH
MEDICATED_PATCH | Freq: Every evening | TRANSDERMAL | 0 refills | 30 days | Status: CP
Start: 2023-05-05 — End: ?
  Filled 2023-05-05: qty 10, 5d supply, fill #0

## 2023-05-05 MED ORDER — AMOXICILLIN 500 MG CAPSULE
ORAL_CAPSULE | Freq: Three times a day (TID) | ORAL | 0 refills | 6 days | Status: CP
Start: 2023-05-05 — End: 2023-05-11
  Filled 2023-05-05: qty 36, 6d supply, fill #0

## 2023-05-05 MED ORDER — CEFPODOXIME 200 MG TABLET
ORAL_TABLET | Freq: Two times a day (BID) | ORAL | 0 refills | 4 days | Status: CP
Start: 2023-05-05 — End: 2023-05-05

## 2023-05-05 MED ADMIN — guaiFENesin (ROBITUSSIN) oral syrup: 200 mg | ORAL | @ 08:00:00 | Stop: 2023-05-05

## 2023-05-05 MED ADMIN — ALPRAZolam (XANAX) tablet 0.5 mg: .5 mg | ORAL | @ 05:00:00

## 2023-05-05 MED ADMIN — guaiFENesin (ROBITUSSIN) oral syrup: 200 mg | ORAL | @ 01:00:00

## 2023-05-05 MED ADMIN — pantoprazole (Protonix) EC tablet 40 mg: 40 mg | ORAL | @ 15:00:00 | Stop: 2023-05-05

## 2023-05-05 MED ADMIN — predniSONE (DELTASONE) tablet 5 mg: 5 mg | ORAL | @ 15:00:00 | Stop: 2023-05-05

## 2023-05-05 MED ADMIN — HYDROcodone-homatropine (HYCODAN) 5-1.5 mg/5 mL (5 mL) syrup 5 mg of hydrocodone: 5 mg | ORAL | @ 01:00:00 | Stop: 2023-05-18

## 2023-05-05 MED ADMIN — losartan (COZAAR) tablet 50 mg: 50 mg | ORAL | @ 01:00:00

## 2023-05-05 MED ADMIN — guaiFENesin (ROBITUSSIN) oral syrup: 200 mg | ORAL | @ 16:00:00 | Stop: 2023-05-05

## 2023-05-05 MED ADMIN — empagliflozin (JARDIANCE) tablet 10 mg: 10 mg | ORAL | @ 15:00:00 | Stop: 2023-05-05

## 2023-05-05 MED ADMIN — HYDROmorphone (DILAUDID) tablet 1 mg: 1 mg | ORAL | @ 15:00:00 | Stop: 2023-05-05

## 2023-05-05 MED ADMIN — HYDROmorphone (DILAUDID) tablet 2 mg: 2 mg | ORAL | @ 11:00:00 | Stop: 2023-05-05

## 2023-05-05 MED ADMIN — HYDROmorphone (DILAUDID) tablet 2 mg: 2 mg | ORAL | @ 07:00:00 | Stop: 2023-05-05

## 2023-05-05 MED ADMIN — lidocaine (ASPERCREME) 4 % 2 patch: 2 | TRANSDERMAL | @ 01:00:00

## 2023-05-05 MED ADMIN — cefTRIAXone (ROCEPHIN) 1 g in sodium chloride 0.9 % (NS) 100 mL IVPB-MBP: 1 g | INTRAVENOUS | @ 05:00:00 | Stop: 2023-05-09

## 2023-05-05 MED ADMIN — HYDROmorphone (DILAUDID) tablet 1 mg: 1 mg | ORAL | @ 03:00:00 | Stop: 2023-05-18

## 2023-05-05 MED ADMIN — azithromycin (ZITHROMAX) tablet 250 mg: 250 mg | ORAL | @ 03:00:00 | Stop: 2023-05-08

## 2023-05-05 MED ADMIN — enoxaparin (LOVENOX) syringe 40 mg: 40 mg | SUBCUTANEOUS | @ 15:00:00 | Stop: 2023-05-05

## 2023-05-05 MED ADMIN — losartan (COZAAR) tablet 50 mg: 50 mg | ORAL | @ 15:00:00 | Stop: 2023-05-05

## 2023-05-05 MED ADMIN — magnesium oxide-Mg AA chelate (Magnesium Plus Protein) 1 tablet: 1 | ORAL | @ 01:00:00

## 2023-05-05 MED ADMIN — albuterol 2.5 mg /3 mL (0.083 %) nebulizer solution 2.5 mg: 2.5 mg | RESPIRATORY_TRACT | @ 01:00:00

## 2023-05-05 NOTE — Unmapped (Addendum)
IMMUNOCOMPROMISED HOST INFECTIOUS DISEASE CONSULT NOTE    Ryan Moon is being seen in consultation at the request of Coralee Pesa, MD for evaluation of recent COVID test.      Assessment/Recommendations:    Ryan Moon is a 37 y.o. 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  Works in Orthoptist of pertinent prior infections:  none    Active infection:  #COVID infection with concern for bacterial PNA, 04/29/23  -04/26/23- Sx with myalgias, chills, fatigue  -11/25  tested COVID Positive in urgent care Rx molnupavir x 5 days  -11/29 presented for worsening cough and pleuritic chest pain  -11/29 RPP negative, CXR with Bibasilar airspace disease, may represent atelectasis versus infection     Antimicrobial allergy/intolerance:  none       RECOMMENDATIONS    Diagnosis  Please obtain :   Sputum:   Routine LRTCx, AFB and fungal cultures  Legionella PCR, PCP PCR  Urine Legionella antigen  Mycoplasma PCR OP swab  MRSA nares  CT chest     Management  Empirically continue PO azithromycin 500mg  every day : Duration TBD  OK to hold remdesivir    Antimicrobial prophylaxis required for host deficiency: transplant immunosuppression  none    Intensive toxicity monitoring for prescription antimicrobials   CBC w/diff at least once per week  CMP at least once per week  clinical assessments for rashes or other skin changes    The [facility] ID service will continue to follow.           Please page the ID Transplant/Liquid Oncology Fellow consult at [phone] with questions.  Patient discussed with Dr. Raylene Miyamoto.    Dierdre Highman, MD  [place] Division of Infectious Diseases    History of Present Illness:      External record(s): Primary team note: RPP negative, repeat rapid swab pending .    Independent historian(s): no independent historian required.       P37y/o M with DDKT 2020 2/2 vasculitis, presented with 2 weeks of worsening cough, low grade fevers, fatigue, and associated chest pain. Patient was found to be COVID positive at urgent care on 11/25, and was prescribed molnupiravir. Interesting, patient has been COVID negative x 2 since presentation. RPP negative. CXR with bibasilar airspace disease which may represent atelectasis vs infection. Patient continues to have productive cough. ICID consulted for further recs     Allergies:  Allergies   Allergen Reactions    Ibuprofen     Acetaminophen Nausea And Vomiting       Medications:   Antimicrobials:  Anti-infectives (From admission, onward)      Start     Dose/Rate Route Frequency Ordered Stop    05/04/23 2100  azithromycin (ZITHROMAX) tablet 250 mg        Placed in Followed by Linked Group    250 mg Oral Daily (standard) 05/03/23 2252 05/08/23 2059            Current/Prior immunomodulators per problem list. No change since admission.  Other medications reviewed.     Past Medical History:   Diagnosis Date    Renal vasculitis (CMS-HCC)     Secondary hyperparathyroidism (CMS-HCC)      No additional immunocompromising condition except as above.    Past Surgical History:   Procedure Laterality Date    APPENDECTOMY  2013    NEPHRECTOMY TRANSPLANTED ORGAN      PR COLSC FLX W/RMVL OF TUMOR POLYP LESION SNARE TQ N/A 07/07/2021    Procedure: COLONOSCOPY FLEX; W/REMOV TUMOR/LES BY SNARE;  Surgeon: Maris Berger, MD;  [facility]: [facility];  Service: Gastroenterology    PR TRANSPLANT,PREP CADAVER RENAL GRAFT Right 04/28/2019    Procedure: [facility] STD PREP CAD DONR RENAL ALLOGFT PRIOR TO TRNSPLNT, INCL DISSEC/REM PERINEPH FAT, DIAPH/RTPER ATTAC; Surgeon: Doyce Loose, MD;  Location: MAIN OR [place];  Service: Transplant    PR TRANSPLANTATION OF KIDNEY Right 04/28/2019    Procedure: RENAL ALLOTRANSPLANTATION, IMPLANTATION OF GRAFT; WITHOUT RECIPIENT NEPHRECTOMY;  Surgeon: Doyce Loose, MD;  Location: MAIN OR [facility];  Service: Transplant     Denies prior surgeries with retained hardware.    Social History:  Tobacco use:   reports that he quit smoking about 4 years ago. His smoking use included cigarettes. He has quit using smokeless tobacco.  His smokeless tobacco use included chew.   Alcohol use:    reports current alcohol use of about 1.0 standard drink of alcohol per week.   Drug use:    reports current drug use. Drug: Marijuana.   Living situation:  Lives with family   Residence:   city   [place]     [place] travel:   Has traveled to [place], [place], [employer] travel:   No travel outside of the [place] Engineer, maintenance service:  Has not served in the [employer]   Employment:  Employed as Museum/gallery conservator and animal exposure:  Animal exposures include dog   Insect exposure:  No tick exposure   Hobbies:  Denies unusual environmental exposures   TB exposures:  No known TB exposure   Sexual history:  No history of STIs   Other significant exposures:  No exposure to well water, No exposure to unpasteurized daily products, and No exposure to raw/undercooked foods     Family History:  Family History   Problem Relation Age of Onset    Kidney disease Neg Hx             Vital Signs last 24 hours:  Temp:  [36.7 ??C (98.1 ??F)-37 ??C (98.6 ??F)] 36.7 ??C (98.1 ??F)  Heart Rate:  [75-79] 78  SpO2 Pulse:  [68-75] 68  Resp:  [16-22] 18  BP: (111-145)/(72-99) 111/72  MAP (mmHg):  [84-113] 84  SpO2:  [90 %-97 %] 96 %  BMI (Calculated):  [26.44] 26.44    Physical Exam:  Patient Lines/Drains/Airways Status       Active Active Lines, Drains, & Airways       Name Placement date Placement time Site Days    Peripheral IV 05/03/23 Anterior;Distal;Right;Upper Arm 05/03/23  1219  Arm  1                  Const [x]  vital signs above    []  NAD, non-toxic appearance [x]  ill-appearing, non-distressed        Eyes [x]  Lids normal bilaterally, conjunctiva anicteric and noninjected OU     [x] PERRL  [] EOMI  ENMT []  Normal appearance of external nose and ears, no nasal discharge        [x]  MMM, no lesions on lips or gums [x]  No thrush, leukoplakia, oral lesions  []  Dentition good []  Edentulous []  Dental caries present  []  Hearing normal  []  TMs with good light reflexes bilaterally         Neck []  Neck of normal appearance and trachea midline        [x]  No thyromegaly, nodules, or tenderness   []  Full neck ROM        Lymph [x]  No LAD in neck     [x]  No LAD in supraclavicular area     []  No LAD in axillae   []  No LAD in epitrochlear chains     []  No LAD in inguinal areas        CV [x]  RRR            []  No peripheral edema     []  Pedal pulses intact   []  No abnormal heart sounds appreciated   [x]  Extremities WWP         Resp []  Normal WOB at rest    [x]  No breathlessness with speaking, no coughing  []  CTA anteriorly    []  CTA posteriorly    Coughing during exam      GI [x]  Normal inspection, NTND   []  NABS     []  No umbilical hernia on exam       []  No hepatosplenomegaly     []  Inspection of perineal and perianal areas normal        GU []  Normal external genitalia     [] No urinary catheter present in urethra   []  No CVA tenderness    []  No tenderness over renal allograft  deferred      MSK [x]  No clubbing or cyanosis of hands       []  No vertebral point tenderness  []  No focal tenderness or abnormalities on palpation of joints in RUE, LUE, RLE, or LLE        Skin [x]  No rashes, lesions, or ulcers of visualized skin     []  Skin warm and dry to palpation         Neuro [x]  Face expression symmetric  []  Sensation to light touch grossly intact throughout    []  Moves extremities equally    [x]  No tremor noted        [x]  CNs II-XII grossly intact     []  DTRs normal and symmetric throughout []  Gait unremarkable        Psych [x]  Appropriate affect       []  Fluent speech         [x]  Attentive, good eye contact  []  Oriented to person, place, time          []  Judgment and insight are appropriate           Data for Medical Decision Making     (05/03/23) EKG QTcF 380s    I discussed mgm't w/qualified health care professional(s) involved in case: primary team .    I reviewed CBC results (wbc 3.3K), chemistry results (Cr 1.67, improving), and micro result(s) (RPP negative).    I independently visualized/interpreted not done.       Recent Labs     Units 05/03/23  1225 05/04/23  0740   WBC 10*9/L 3.4* 3.3*   HGB g/dL 59.5 63.8*   PLT 75*6/E 269 257  NEUTROABS 10*9/L 1.9  --    LYMPHSABS 10*9/L 0.5*  --    EOSABS 10*9/L 0.1  --    NA mmol/L 135 - 136 136   K mmol/L 4.4 - 4.4 4.2   BUN mg/dL 28* 24*   CREATININE mg/dL 1.61* 0.96*   GLU mg/dL 045 409   CALCIUM mg/dL 9.5 9.5   MG mg/dL  --  2.1   BILITOT mg/dL 0.4 0.3   AST U/L 78* 36*   ALT U/L 66* 48   CRP mg/L  --  102.0*       Lab Results   Component Value Date    CRP 102.0 (H) 05/04/2023    Tacrolimus, Trough <1.0 (L) 12/27/2022    Total IgG 1,095 07/04/2020       Microbiology:  Microbiology Results (last day)       Procedure Component Value Date/Time Date/Time    Lower Respiratory Culture [[id number]] Collected: 05/04/23 1601    Lab Status: In process Specimen: SPUTUM EXPECTORATED Updated: 05/04/23 1605    RAPID INFLUENZA/RSV/COVID PCR [[id number]]  (Normal) Collected: 05/04/23 1147    Lab Status: Final result Specimen: Nasopharyngeal Swab Updated: 05/04/23 1322     SARS-CoV-2 PCR Negative     Influenza A Negative     Influenza B Negative     RSV Negative    Narrative:      This test was performed using the Cepheid Xpert Xpress CoV-2/Flu/RSV plus assay, which has been validated by the CLIA-certified, CAP-inspected [employer] FDA has granted Emergency Use Authorization for this test. Negative results do not preclude infection and should be interpreted along with clinical observations, patient history, and epidemiological information. Information for providers and patients can be found here: [email or web address]    Mycoplasma pneumoniae PCR [[id number]] Collected: 05/04/23 1147    Lab Status: In process Specimen: Throat Updated: 05/04/23 1241    Respiratory Pathogen Panel [[id number]]  (Normal) Collected: 05/03/23 2025    Lab Status: Final result Specimen: Nasopharyngeal Swab Updated: 05/03/23 2209     Adenovirus Not Detected     Coronavirus HKU1 Not Detected     Coronavirus NL63 Not Detected     Coronavirus 229E Not Detected     Coronavirus OC43 PCR Not Detected     Metapneumovirus Not Detected     Rhinovirus/Enterovirus Not Detected     Influenza A Not Detected     Influenza B Not Detected     Parainfluenza 1 Not Detected     Parainfluenza 2 Not Detected     Parainfluenza 3 Not Detected     Parainfluenza 4 Not Detected     RSV Not Detected     Bordetella pertussis Not Detected     Comment: If B. pertussis/parapertussis infection is suspected, the Bordetella pertussis/parapertussis Qualitative PCR test should be ordered.        Bordetella parapertussis Not Detected     Chlamydophila (Chlamydia) pneumoniae Not Detected     Mycoplasma pneumoniae Not Detected     SARS-CoV-2 PCR Not Detected    Narrative:      This result was obtained using the FDA-cleared [employer] Respiratory 2.1 Panel. Performance characteristics have been established and verified by the Clinical Molecular Microbiology Laboratory, [facility]. This assay does not distinguish between rhinovirus and enterovirus. Lower respiratory specimens will not be tested for Bordetella pertussis/parapertussis. For nasopharyngeal swabs, cross-reactivity may occur between B. pertussis and non-pertussis Bordetella species. All positive B. pertussis results will be automatically confirmed using our in-house  PCR assay.            Imaging:  XR Chest 2 views  Result Date: 05/03/2023  EXAM: XR CHEST 2 VIEWS ACCESSION: [id number] UN REPORT DATE: 05/03/2023 3:43 PM     CLINICAL INDICATION: COUGH      TECHNIQUE: PA and Lateral Chest Radiographs.     COMPARISON: Chest radiograph 06/03/2022.     FINDINGS:     Lungs hypoinflated. Bibasilar airspace disease.  No pleural effusion or pneumothorax.     Normal heart size and mediastinal contours.     Prominent hilum bilaterally, unchanged since multiple priors.             Bibasilar airspace disease, may represent atelectasis versus infection. Recommend follow-up chest radiograph in 6 to 8 weeks to ensure resolution.    ECG 12 Lead  Result Date: 05/03/2023  NORMAL SINUS RHYTHM NORMAL ECG WHEN COMPARED WITH ECG OF 03-Jun-2022 03:35, QUESTIONABLE CHANGE IN QRS AXIS NONSPECIFIC T WAVE ABNORMALITY NO LONGER EVIDENT IN INFERIOR LEADS T WAVE AMPLITUDE HAS INCREASED IN ANTEROLATERAL LEADS Confirmed by Eldred Manges ([id number]) on 05/03/2023 3:32:21 PM      Serologies:  Lab Results   Component Value Date    CMV IGG Positive (A) 04/16/2019    EBV VCA IgG Antibody Positive (A) 04/16/2019    HIV Antigen/Antibody Combo Nonreactive 10/03/2021    Hep A IgG Nonreactive 06/15/2020    Hep B Surface Ag Nonreactive 06/29/2022    Hep B S Ab Reactive (A) 06/29/2022    Hep B Surf Ab Quant 93.66 (H) 06/29/2022    Hep B Core Total Ab Nonreactive 06/29/2022    Hepatitis C Ab Nonreactive 06/29/2022    RPR Nonreactive 04/16/2019    HSV 1 IgG Negative 04/16/2019    HSV 2 IgG Negative 04/16/2019    Varicella IgG Positive 04/16/2019    Rubella IgG Scr Positive 03/23/2019    Toxoplasma Gondii IgG Negative 03/23/2019    Quantiferon TB Gold Plus Interpretation Negative 06/29/2022    Quantiferon Mitogen Minus Nil 3.96 06/29/2022    Quantiferon Antigen 1 minus Nil 0.00 06/29/2022       Immunizations:  Immunization History   Administered Date(s) Administered    DTP 03/11/1989    INFLUENZA INJ MDCK PF, QUAD,(FLUCELVAX)(65MO AND UP EGG FREE) 03/03/2019    Influenza Vaccine Quad(IM)6 MO-Adult(PF) 05/18/2020    Influenza Virus Vaccine, unspecified formulation 03/29/2018    OPV 03/11/1989    PNEUMOCOCCAL POLYSACCHARIDE 23-VALENT 12/16/2017    Pneumococcal Conjugate 13-Valent 12/30/2017    TdaP 05/21/2013, 01/23/2022

## 2023-05-05 NOTE — Unmapped (Signed)
Pt alert and oriented x 4. Vital signs stable. Pt primary complaint is cough and associated pain and anxiety related to ongoing cough and difficulty breathing. Provided Q4 hour nebulizer treatments. Pt requested pain medication every 4 hours as well and requested prn throat lozenges. Pt also requested xanax x 1 this  morning for anxiety. Over the course of the day pt appeared to have a decrease in cough, but found relief with all of the various supportive interventions. Completed many  respiratory tests, sending down respiratory samples to the core lab and micro lab. Family came to bedside for short visit this afternoon.       Problem: Adult Inpatient Plan of Care  Goal: Plan of Care Review  Outcome: Progressing  Flowsheets (Taken 05/04/2023 1939)  Progress: improving  Outcome Evaluation: Pt cough improved if not resolved by time of discharge  Plan of Care Reviewed With: patient  Goal: Patient-Specific Goal (Individualized)  Outcome: Progressing  Flowsheets (Taken 05/04/2023 1939)  Individualized Care Needs: Providing interventions for cough and pain associated with cough.  Anxieties, Fears or Concerns: Pt concerned about being separated from his family.  Goal: Absence of Hospital-Acquired Illness or Injury  Outcome: Progressing  Intervention: Identify and Manage Fall Risk  Recent Flowsheet Documentation  Taken 05/04/2023 1800 by Thereasa Solo, RN  Safety Interventions: low bed  Taken 05/04/2023 1600 by Thereasa Solo, RN  Safety Interventions: low bed  Taken 05/04/2023 1400 by Thereasa Solo, RN  Safety Interventions:   family at bedside   low bed  Taken 05/04/2023 1200 by Thereasa Solo, RN  Safety Interventions: low bed  Taken 05/04/2023 1000 by Thereasa Solo, RN  Safety Interventions: low bed  Taken 05/04/2023 0800 by Thereasa Solo, RN  Safety Interventions:   fall reduction program maintained   low bed  Intervention: Prevent Skin Injury  Recent Flowsheet Documentation  Taken 05/04/2023 0800 by Thereasa Solo, RN  Positioning for Skin: Supine/Back  Intervention: Prevent and Manage VTE (Venous Thromboembolism) Risk  Recent Flowsheet Documentation  Taken 05/04/2023 0900 by Thereasa Solo, RN  VTE Prevention/Management: ambulation promoted  Goal: Optimal Comfort and Wellbeing  Outcome: Progressing  Goal: Readiness for Transition of Care  Outcome: Progressing  Goal: Rounds/Family Conference  Outcome: Progressing     Problem: Infection  Goal: Absence of Infection Signs and Symptoms  Outcome: Progressing  Intervention: Prevent or Manage Infection  Recent Flowsheet Documentation  Taken 05/04/2023 1600 by Thereasa Solo, RN  Isolation Precautions: spec airborne/contact precautions discontinued  Taken 05/04/2023 1200 by Thereasa Solo, RN  Isolation Precautions: spec airborne/contact precautions maintained  Taken 05/04/2023 0800 by Thereasa Solo, RN  Isolation Precautions: spec airborne/contact precautions initiated

## 2023-05-05 NOTE — Unmapped (Signed)
Physician Discharge Summary [facility]  [facility] OBSERVATION [facility]  [address]  [place] [address]  Dept: [phone]  Loc: [phone]     Identifying Information:   Ryan Moon  09/14/1985  [id number]    Primary Care Physician: Harlow Mares, MD   Code Status: Full Code    Admit Date: 05/03/2023    Discharge Date: 05/05/2023     Discharge To: Home    Discharge Service: [facility]     Discharge Attending Physician: Linton Flemings, MD    Discharge Diagnoses:  Principal Problem:    Productive cough (POA: Yes)  Active Problems:    Kidney transplant recipient (POA: Not Applicable)    Immunosuppression (CMS-HCC) (POA: Yes)    Essential hypertension (POA: Yes)    GERD (gastroesophageal reflux disease) (POA: Yes)    Stage 3a chronic kidney disease (CMS-HCC) (POA: Yes)    Persistent adjustment disorder with mixed anxiety and depressed mood (POA: Yes)  Resolved Problems:    * No resolved hospital problems. *      Outpatient Provider Follow Up Issues:   [ ]  Belatacept is on hold with active infection, and transplant nephrology will follow up when to restart  [ ]  Follow up multiple studies as below that are pending at discharge including fungal work up    Hospital Course:   Ryan Moon is a 37 y.o. male that presented to [facility] with Productive cough.     Productive cough with chest pain concerning for bacterial pneumonia vs possible fungal pneumonia vs less likely COVID infection: Patient presented with 2 weeks of worsening cough, low grade fevers, fatigue, and associated chest pain. Patient was found to be COVID positive at urgent care on 11/25, and was prescribed molnupiravir. Interesting, patient has been COVID negative x 2 since presentation. Thus, suspect patient may not have actually had COVID. RPP negative. CXR with bibasilar airspace disease which may represent atelectasis vs infection. Patient likely with bacterial bronchitis vs pneumonia. Patient was started on azithromycin, and ceftriaxone was added when patient with sputum culture which prelim revealed 3+ gram positive cocci and 1+ gram positive bacilli. ICID was consulted, and recommended additional work up including sputum studies including AFB, legionella, mycoplasma PCR. CT chest revealed findings most suggestive of bronchopneumonia with the possibility of fungal pneumonia vs endobronchial infectious etiology. Thus, prior to discharge ICID recommended to check histoplasma Ab, coccidioides Ab, histo/blast urine Ag. Cryptococcal Ag was negative. Sputum culture was pending at time of discharge. ICID recommended to discharge on amoxicillin 1000 mg TID and doxycycline 100 mg BID to complete 7 day courses of antibiotics total. ICID will follow up pending results, and will arrange follow up.       Secondary/Additional Active Problems:  History of kidney transplant I CKD stage 3: Baseline Cr ranges between 2 and 1.4. Cr remained at baseline during hospitalization. Due for monthly belatacept on Wednesday - however counseled to hold this week given active infection on antibiotics. Continued home prednisone 5 mg daily. Patient with follow up with transplant nephrology to determine when to restart. I sent a message to his primary nephrologist who will ensure transplant team will follow up this week. Continued home Jardiance and losartan     Essential hypertension: BP at goal during admission. Continue home losartan.      GERD (gastroesophageal reflux disease): Continued PPI     Persistent adjustment disorder with mixed anxiety and depressed mood: Continued home Xanax    Procedures:  No admission procedures for hospital encounter.  ______________________________________________________________________  Discharge Medications:     Your Medication List        PAUSE taking these medications      belatacept 250 mg Solr  Wait to take this until your doctor or other care provider tells you to start again.  Commonly known as: NULOJIX  Belatacept  5 mg/kg every 2 weeks for 5 doses then once every 4 weeks            STOP taking these medications      LAGEVRIO (EUA) 200 mg capsule  Generic drug: molnupiravir (EUA)            START taking these medications      amoxicillin 500 MG capsule  Commonly known as: AMOXIL  Take 2 capsules (1,000 mg total) by mouth Three (3) times a day for 6 days.     doxycycline 100 MG tablet  Commonly known as: VIBRA-TABS  Take 1 tablet (100 mg total) by mouth two (2) times a day for 6 days.     HYDROmorphone 2 MG tablet  Commonly known as: DILAUDID  Take 1 tablet (2 mg total) by mouth every four (4) hours as needed for severe pain     lidocaine 4 % patch  Commonly known as: ASPERCREME  Place 2 patches on the skin nightly for 12 hours only then remove for 12 hours.            CONTINUE taking these medications      ALPRAZolam 0.5 MG tablet  Commonly known as: XANAX  Take 1 tablet (0.5 mg total) by mouth two (2) times a day as needed for anxiety.     benzonatate 200 MG capsule  Commonly known as: TESSALON  Take 1 capsule (200 mg total) by mouth Three (3) times a day as needed for cough.     empagliflozin 10 mg tablet  Commonly known as: JARDIANCE  Take 1 tablet (10 mg total) by mouth daily.     losartan 50 MG tablet  Commonly known as: COZAAR  Take 1 tablet (50 mg total) by mouth two (2) times a day.     magnesium (amino acid chelate) 133 mg tablet  Generic drug: magnesium oxide-Mg AA chelate  Take 1 tablet by mouth daily.     omeprazole 20 MG capsule  Commonly known as: PriLOSEC  Take 1 capsule (20 mg total) by mouth daily.     ondansetron 4 MG disintegrating tablet  Commonly known as: ZOFRAN-ODT  Take 1 tablet (4 mg total) by mouth every eight (8) hours as needed.     predniSONE 5 MG tablet  Commonly known as: DELTASONE  Take 1 tablet (5 mg total) by mouth daily.              Allergies:  Ibuprofen and Acetaminophen  ______________________________________________________________________  Pending Test Results (if blank, then none):  Pending Labs       Order Current Status    MRSA Screen Collected (05/04/23 2340)    AFB SMEAR In process    AFB culture In process    Coccidioides Antibodies In process    Fungal (Mould) Pathogen Culture In process    Histo/Blasto Antigen, Urine In process    Histoplasma Antibody In process    Legionella PCR In process    Mycoplasma pneumoniae PCR In process    Lower Respiratory Culture Preliminary result            Most Recent  Labs:  All lab results last 24 hours -   Recent Results (from the past 24 hours)   MRSA Screen    Collection Time: 05/04/23 11:50 PM    Specimen: Nose (nasal passage); Nares   Result Value Ref Range    MRSA PCR Not Detected Not Detected   Legionella Urinary Antigen    Collection Time: 05/05/23 12:18 AM   Result Value Ref Range    Legionella, Urinary Antigen Negative Negative   Basic Metabolic Panel    Collection Time: 05/05/23  4:22 AM   Result Value Ref Range    Sodium 138 135 - 145 mmol/L    Potassium 3.9 3.4 - 4.8 mmol/L    Chloride 103 98 - 107 mmol/L    CO2 22.0 20.0 - 31.0 mmol/L    Anion Gap 13 5 - 14 mmol/L    BUN 18 9 - 23 mg/dL    Creatinine 8.11 (H) 0.73 - 1.18 mg/dL    BUN/Creatinine Ratio 10     eGFR CKD-EPI (2021) Male 49 (L) >=60 mL/min/1.31m2    Glucose 102 70 - 179 mg/dL    Calcium 9.6 8.7 - 91.4 mg/dL   Hepatic Function Panel    Collection Time: 05/05/23  4:22 AM   Result Value Ref Range    Albumin 3.9 3.4 - 5.0 g/dL    Total Protein 7.6 5.7 - 8.2 g/dL    Total Bilirubin 0.3 0.3 - 1.2 mg/dL    Bilirubin, Direct <7.82 0.00 - 0.30 mg/dL    AST 33 <=95 U/L    ALT 44 10 - 49 U/L    Alkaline Phosphatase 160 (H) 46 - 116 U/L   Magnesium Level    Collection Time: 05/05/23  4:22 AM   Result Value Ref Range    Magnesium 2.2 1.6 - 2.6 mg/dL   CBC    Collection Time: 05/05/23  4:31 AM   Result Value Ref Range    WBC 3.2 (L) 3.6 - 11.2 10*9/L    RBC 3.92 (L) 4.26 - 5.60 10*12/L    HGB 12.6 (L) 12.9 - 16.5 g/dL    HCT 62.1 (L) 30.8 - 48.0 %    MCV 93.6 77.6 - 95.7 fL    MCH 32.0 25.9 - 32.4 pg    MCHC 34.2 32.0 - 36.0 g/dL    RDW 65.7 84.6 - 96.2 %    MPV 7.2 6.8 - 10.7 fL    Platelet 260 150 - 450 10*9/L   Addon Differential Only    Collection Time: 05/05/23  4:31 AM   Result Value Ref Range    Neutrophils % 48.4 %    Lymphocytes % 24.2 %    Monocytes % 22.7 %    Eosinophils % 4.0 %    Basophils % 0.7 %    Absolute Neutrophils 1.5 (L) 1.8 - 7.8 10*9/L    Absolute Lymphocytes 0.8 (L) 1.1 - 3.6 10*9/L    Absolute Monocytes 0.7 0.3 - 0.8 10*9/L    Absolute Eosinophils 0.1 0.0 - 0.5 10*9/L    Absolute Basophils 0.0 0.0 - 0.1 10*9/L   Morphology Review    Collection Time: 05/05/23  4:31 AM   Result Value Ref Range    Smear Review Comments See Comment (A) Undefined    Dohle Bodies Present (A) Not Present    Toxic Vacuolation Present (A) Not Present   Cryptococcal Antigen, Serum    Collection Time: 05/05/23  2:25 PM   Result Value Ref  Range    Crypto Ag Negative Negative     Microbiology -   Microbiology Results (last day)       Procedure Component Value Date/Time Date/Time    Lower Respiratory Culture [[id number]]  (Abnormal) Collected: 05/04/23 1601    Lab Status: Preliminary result Specimen: SPUTUM EXPECTORATED Updated: 05/05/23 1142     Lower Respiratory Culture TOO YOUNG TO READ     Gram Stain 10-25 PMNS/LPF      <10  Epithelial cells/LPF      3+ Gram positive cocci      1+ Gram positive bacilli      Acceptable for culture    Narrative:      Specimen Source: SPUTUM EXPECTORATED    Respiratory Pathogen Panel [[id number]]     Lab Status: No result Specimen: SPUTUM EXPECTORATED     MRSA Screen [[id number]]  (Normal) Collected: 05/04/23 2350    Lab Status: Final result Specimen: Nares from Nose (nasal passage) Updated: 05/05/23 0110     MRSA PCR Not Detected    Narrative:      This result was obtained using the FDA-cleared Cepheid Xpert MRSA NxG assay, a qualitative in vitro diagnostic test intended for the detection of methicillin-resistant Staphylococcus aureus (MRSA) DNA directly from nasal swabs. The test utilizes automated real-time polymerase chain reaction (PCR) for the amplification of MRSA- specific DNA targets and fluorogenic target-specific hybridization probes for the real-time detection of the amplified DNA. The assay's performance characteristics have been verified by the [facility].    MRSA Screen [[id number]] Collected: 05/04/23 2340    Lab Status: No result Specimen: Nares from Nose (nasal passage)     AFB SMEAR [[id number]] Collected: 05/04/23 2004    Lab Status: In process Specimen: SPUTUM EXPECTORATED Updated: 05/04/23 2026    AFB culture [[id number]] Collected: 05/04/23 2004    Lab Status: In process Specimen: SPUTUM EXPECTORATED Updated: 05/04/23 2026    Fungal (Mould) Pathogen Culture [[id number]] Collected: 05/04/23 2004    Lab Status: In process Specimen: SPUTUM EXPECTORATED Updated: 05/04/23 2026    Legionella PCR [[id number]] Collected: 05/04/23 2005    Lab Status: In process Specimen: SPUTUM EXPECTORATED Updated: 05/04/23 2026            Relevant Studies/Radiology (if blank, then none):  CT Chest Wo Contrast  Result Date: 05/05/2023  EXAM: CT CHEST WO CONTRAST ACCESSION: [id number] UN REPORT DATE: 05/05/2023 8:48 AM CLINICAL INDICATION: Assess for pneumonia or lung pathology, productive cough in immunocompromised patient TECHNIQUE: Contiguous noncontrast axial images were reconstructed through the chest following a single breath hold helical acquisition. Images were reformatted in the axial. coronal, and sagittal planes. MIP slabs were also constructed. COMPARISON: Chest radiograph May 03, 2023 and prior exams FINDINGS: LUNGS, AIRWAYS, AND PLEURA: Diffuse significant bronchial wall thickening with distal bronchiolar plugging and bronchocentric nodular airspace disease with surrounding ill-defined groundglass opacities and scattered micronodules which are most pronounced in the peripheral right upper lobe, lateral right middle lobe, lingula, and lung bases. Mild paraseptal and centrilobular emphysema. No pleural effusion or pneumothorax. MEDIASTINUM AND LYMPH NODES: 1.1 cm subcarinal lymph node, likely reactive. Additional nonenlarged mediastinal lymph nodes. Small sliding-type hiatal hernia. HEART AND VASCULATURE: Cardiac chambers are normal in size. No pericardial effusion. Aorta is normal in caliber. Main pulmonary artery is normal in size. Mild coronary calcifications. CHEST WALL AND BONES: Unremarkable. UPPER ABDOMEN: Atrophic native kidneys.     -Findings are most suggestive of bronchopneumonia, the possibility of fungal pneumonia (in this clinical setting of neutropenia)  versus endobronchial infectious etiology needs to be considered.     XR Chest 2 views  Result Date: 05/03/2023  EXAM: XR CHEST 2 VIEWS ACCESSION: [id number] UN REPORT DATE: 05/03/2023 3:43 PM CLINICAL INDICATION: COUGH  TECHNIQUE: PA and Lateral Chest Radiographs. COMPARISON: Chest radiograph 06/03/2022. FINDINGS: Lungs hypoinflated. Bibasilar airspace disease.  No pleural effusion or pneumothorax. Normal heart size and mediastinal contours. Prominent hilum bilaterally, unchanged since multiple priors.     Bibasilar airspace disease, may represent atelectasis versus infection. Recommend follow-up chest radiograph in 6 to 8 weeks to ensure resolution.    ECG 12 Lead  Result Date: 05/03/2023  NORMAL SINUS RHYTHM NORMAL ECG WHEN COMPARED WITH ECG OF 03-Jun-2022 03:35, QUESTIONABLE CHANGE IN QRS AXIS NONSPECIFIC T WAVE ABNORMALITY NO LONGER EVIDENT IN INFERIOR LEADS T WAVE AMPLITUDE HAS INCREASED IN ANTEROLATERAL LEADS Confirmed by Eldred Manges [id number]) on 05/03/2023 3:32:21 PM    ______________________________________________________________________  Discharge Instructions:       Appointments which have been scheduled for you      May 08, 2023 9:00 AM  INFUSION LVL 2 HRS with CHAIR 04 [place] TP INF [facility], RN 01 [facility] TP INF [facility]  [facility] TRANSPLANT INFUSION [place] [facility] REGION) [address]  [place] [address]  [phone]        Jun 12, 2023 10:00 AM  INFUSION LVL 2 HRS with CHAIR 02 [place] TP INF [facility], RN 01 [facility] TP INF [facility]  [facility] TRANSPLANT INFUSION [place] [facility] REGION) [address]  [place] [address]  [phone]        Jul 10, 2023 10:00 AM  INFUSION LVL 2 HRS with CHAIR 02 [place] TP INF [facility], RN 01 [facility] TP INF [facility]  [facility] TRANSPLANT INFUSION [place] [facility] REGION) [address]  [place] [address]  [phone]        Aug 07, 2023 10:00 AM  INFUSION LVL 2 HRS with CHAIR 02  TP INF [facility], RN 01 [facility] TP INF [facility]  [facility] TRANSPLANT INFUSION [facility]) [address]  [place] [address]  [phone]             ______________________________________________________________________  Discharge Day Services:  BP 119/81  - Pulse 78  - Temp 36.4 ??C (97.5 ??F) (Oral)  - Resp 18  - Ht 182.9 cm (6')  - Wt 88.2 kg (194 lb 6.4 oz)  - SpO2 95%  - BMI 26.37 kg/m??   Pt seen on the day of discharge and determined appropriate for discharge.    Condition at Discharge: good    Length of Discharge: I spent greater than 30 mins in the discharge of this patient.

## 2023-05-05 NOTE — Unmapped (Signed)
IMMUNOCOMPROMISED HOST INFECTIOUS DISEASE PROGRESS NOTE      Assessment/Plan:     Ryan Moon is a 37 y.o. 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  Works in Programme researcher, broadcasting/film/video of pertinent prior infections:  none     Active infection:  #COVID infection with concern for bacterial PNA, 04/29/23  -04/26/23- Sx with myalgias, chills, fatigue  -11/25  tested COVID Positive in urgent care Rx molnupavir x 5 days  -11/29 presented for worsening cough and pleuritic chest pain  -11/29 RPP negative, rapid flu/covid negative. CXR with Bibasilar airspace disease, may represent atelectasis versus infection   Urine legionella negative, MRSA nares negative PJP sputum canceled by lab with inadequate volume. LRCx with GPC, GPB in gram stain, Cx TYTR  Pending : mycoplasma OP, sputum RPP  -11/30 CT chest with Diffuse significant bronchial wall thickening with distal bronchiolar plugging and bronchocentric nodular airspace disease with surrounding ill-defined groundglass opacities and scattered micronodules which are most pronounced in the peripheral right upper lobe, lateral right middle lobe, lingula, and lung bases. Mild paraseptal and centrilobular emphysema. No pleural effusion or pneumothorax.     Rx: Molnupivir x 4 days OP-> 11/29 azithro-> 11/30 CTX/Azith     Antimicrobial allergy/intolerance:  none         RECOMMENDATIONS    While CT chest changes most likely due to recent diagnosis of COVID, suprisingly his RPP and rapid testing have remained negative inpatient with concern for LRI. He is now s/p 4 days of Molnupivir with ongoing sputum production concerning for secondary bacterial PNA. CT chest read with concern for fungal pneumonia however could also be 2/2 covid. Given patient otherwise stable and improving Sx- will initiate work up for atypical infections and follow outpatient as patietn currently adamant to go home    Diagnosis  Pending mycoplasma OP to follow  Pending LRCX- so far TYTR( per micro no staph or GNRs, concern for strep spps)  Unable to add RPP or PJP PCR in sputum as patient denies cough production and unable to add on sample from yesterday  Please obtain : Serum crytpo antigen, histoplasma and Coccidiodes antibodies , Urine histo/blasto antigen  Might need repeat CT chest in few week to ensure resolution     Management  Stop CTX and Azithro  Start PO amoxicillin 1g TID empirically targeting strep spps along with PO doxycycline 100 BID for atypicals  Aim for 7 days course     Antimicrobial prophylaxis required for transplant immunosuppression   none    Intensive toxicity monitoring for prescription antimicrobials   CBC w/diff at least once per week  CMP at least once per week  clinical assessments for rashes or other skin changes    The [facility] ID service will sign off and arrange outpatient ID follow up.           Please page the ID Transplant/Liquid Oncology Fellow consult at [phone] with questions.  Patient discussed with Dr. Marney Setting.    Dierdre Highman, MD  [facility]  Division of Infectious Diseases    Subjective:     External record(s): Primary team note: pt asking to do home .    Independent historian(s): no independent historian required.       Interval History:   Patient was seen and examined at bedside. Remains afebrile.  Denies nausea/vomiting/diarrhea  Says cough is much improved , asking to get home today    Medications:  Current Medications as of 05/05/2023  Scheduled  PRN   albuterol, 2.5 mg, 4x Daily (RT)  azithromycin, 250 mg, Daily  cefTRIAXone, 1 g, Q24H  empagliflozin, 10 mg, Daily  enoxaparin (LOVENOX) injection, 40 mg, Q24H  guaiFENesin, 200 mg, QID  lidocaine, 2 patch, Nightly  losartan, 50 mg, BID  magnesium oxide-Mg AA chelate, 1 tablet, Daily  pantoprazole, 40 mg, Daily  predniSONE, 5 mg, Daily      ALPRAZolam, 0.5 mg, BID PRN  calcium carbonate, 400 mg elem calcium, Daily PRN  HYDROcodone-homatropine, 5 mg of hydrocodone, Q4H PRN  HYDROmorphone, 1 mg, Q4H PRN   Or  HYDROmorphone, 2 mg, Q4H PRN  melatonin, 3 mg, Nightly PRN  cough/sore throat lozenge, 1 lozenge, Q2H PRN  naloxone, 0.4 mg, Q5 Min PRN  polyethylene glycol, 17 g, Daily PRN         Objective:     Vital Signs last 24 hours:  Temp:  [36.4 ??C (97.5 ??F)-36.9 ??C (98.4 ??F)] 36.4 ??C (97.5 ??F)  Heart Rate:  [78-97] 78  Resp:  [18] 18  BP: (105-126)/(71-87) 119/81  MAP (mmHg):  [82-97] 92  SpO2:  [92 %-95 %] 95 %    Physical Exam:   Patient Lines/Drains/Airways Status       Active Active Lines, Drains, & Airways       Name Placement date Placement time Site Days    Peripheral IV 05/03/23 Anterior;Distal;Right;Upper Arm 05/03/23  1219  Arm  2                  Const [x]  vital signs above    []  NAD, non-toxic appearance []  Chronically ill-appearing, non-distressed        Eyes []  Lids normal bilaterally, conjunctiva anicteric and noninjected OU     [] PERRL  [] EOMI        ENMT []  Normal appearance of external nose and ears, no nasal discharge        []  MMM, no lesions on lips or gums [x]  No thrush, leukoplakia, oral lesions  []  Dentition good []  Edentulous []  Dental caries present  []  Hearing normal  []  TMs with good light reflexes bilaterally         Neck []  Neck of normal appearance and trachea midline        []  No thyromegaly, nodules, or tenderness   []  Full neck ROM        Lymph []  No LAD in neck     []  No LAD in supraclavicular area     []  No LAD in axillae   []  No LAD in epitrochlear chains     []  No LAD in inguinal areas        CV [x]  RRR            []  No peripheral edema     []  Pedal pulses intact   []  No abnormal heart sounds appreciated   []  Extremities WWP         Resp [x]  Normal WOB at rest    []  No breathlessness with speaking, no coughing  []   CTA anteriorly    []  CTA posteriorly          GI []  Normal inspection, NTND   []  NABS     []  No umbilical hernia on exam       []  No hepatosplenomegaly     []  Inspection of perineal and perianal areas normal        GU []  Normal external genitalia     [] No urinary catheter present in urethra   []  No CVA tenderness    []  No tenderness over renal allograft        MSK []  No clubbing or cyanosis of hands       []  No vertebral point tenderness  []  No focal tenderness or abnormalities on palpation of joints in RUE, LUE, RLE, or LLE        Skin [x]  No rashes, lesions, or ulcers of visualized skin     []  Skin warm and dry to palpation         Neuro []  Face expression symmetric  []  Sensation to light touch grossly intact throughout    [x]  Moves extremities equally    []  No tremor noted        []  CNs II-XII grossly intact     []  DTRs normal and symmetric throughout []  Gait unremarkable        Psych []  Appropriate affect       []  Fluent speech         []  Attentive, good eye contact  []  Oriented to person, place, time          []  Judgment and insight are appropriate           Data for Medical Decision Making     (05/03/23) EKG QTcF    I discussed mgm't w/qualified health care professional(s) involved in case: Primary team .    I reviewed CBC results (wbc 3.2K), chemistry results (Cr stable, elevated), and micro result(s) (LRCx TYTR).    I independently visualized/interpreted not done.       Recent Labs     Units 05/04/23  0740 05/05/23  0422 05/05/23  0431   WBC 10*9/L 3.3*  --  3.2*   HGB g/dL 54.0*  --  98.1*   PLT 10*9/L 257  --  260   NEUTROABS 10*9/L  --   --  1.5*   LYMPHSABS 10*9/L  --   --  0.8*   EOSABS 10*9/L  --   --  0.1   BUN mg/dL 24* 18  --    CREATININE mg/dL 1.91* 4.78*  --    AST U/L 36* 33  --    ALT U/L 48 44  --    BILITOT mg/dL 0.3 0.3  --    ALKPHOS U/L 171* 160*  --    K mmol/L 4.2 3.9  --    MG mg/dL 2.1 2.2  --    CALCIUM mg/dL 9.5 9.6  --    CRP mg/L 102.0*  --   --        Microbiology:  Microbiology Results (last day)       Procedure Component Value Date/Time Date/Time    Lower Respiratory Culture [[id number]]  (Abnormal) Collected: 05/04/23 1601    Lab Status: Preliminary result Specimen: SPUTUM EXPECTORATED Updated: 05/05/23 1142     Lower Respiratory Culture TOO YOUNG TO READ     Gram Stain 10-25 PMNS/LPF      <10  Epithelial cells/LPF  3+ Gram positive cocci      1+ Gram positive bacilli      Acceptable for culture    Narrative:      Specimen Source: SPUTUM EXPECTORATED    Respiratory Pathogen Panel [[id number]]     Lab Status: No result Specimen: SPUTUM EXPECTORATED     MRSA Screen [[id number]]  (Normal) Collected: 05/04/23 2350    Lab Status: Final result Specimen: Nares from Nose (nasal passage) Updated: 05/05/23 0110     MRSA PCR Not Detected    Narrative:      This result was obtained using the FDA-cleared Cepheid Xpert MRSA NxG assay, a qualitative in vitro diagnostic test intended for the detection of methicillin-resistant Staphylococcus aureus (MRSA) DNA directly from nasal swabs. The test utilizes automated real-time polymerase chain reaction (PCR) for the amplification of MRSA- specific DNA targets and fluorogenic target-specific hybridization probes for the real-time detection of the amplified DNA. The assay's performance characteristics have been verified by the [facility].    MRSA Screen [[id number]] Collected: 05/04/23 2340    Lab Status: No result Specimen: Nares from Nose (nasal passage)     AFB SMEAR [[id number]] Collected: 05/04/23 2004    Lab Status: In process Specimen: SPUTUM EXPECTORATED Updated: 05/04/23 2026    AFB culture [[id number]] Collected: 05/04/23 2004    Lab Status: In process Specimen: SPUTUM EXPECTORATED Updated: 05/04/23 2026    Fungal ([place]) Pathogen Culture [[id number]] Collected: 05/04/23 2004    Lab Status: In process Specimen: SPUTUM EXPECTORATED Updated: 05/04/23 2026    Legionella PCR [[id number]] Collected: 05/04/23 2005    Lab Status: In process Specimen: SPUTUM EXPECTORATED Updated: 05/04/23 2026            Imaging:  CT Chest Wo Contrast  Result Date: 05/05/2023  EXAM: CT CHEST WO CONTRAST ACCESSION: [id number] UN REPORT DATE: 05/05/2023 8:48 AM     CLINICAL INDICATION: Assess for pneumonia or lung pathology, productive cough in immunocompromised patient     TECHNIQUE: Contiguous noncontrast axial images were reconstructed through the chest following a single breath hold helical acquisition. Images were reformatted in the axial. coronal, and sagittal planes. MIP slabs were also constructed.     COMPARISON: Chest radiograph May 03, 2023 and prior exams     FINDINGS:     LUNGS, AIRWAYS, AND PLEURA: Diffuse significant bronchial wall thickening with distal bronchiolar plugging and bronchocentric nodular airspace disease with surrounding ill-defined groundglass opacities and scattered micronodules which are most pronounced in the peripheral right upper lobe, lateral right middle lobe, lingula, and lung bases. Mild paraseptal and centrilobular emphysema. No pleural effusion or pneumothorax.     MEDIASTINUM AND LYMPH NODES: 1.1 cm subcarinal lymph node, likely reactive. Additional nonenlarged mediastinal lymph nodes. Small sliding-type hiatal hernia.     HEART AND VASCULATURE: Cardiac chambers are normal in size. No pericardial effusion. Aorta is normal in caliber. Main pulmonary artery is normal in size. Mild coronary calcifications.     CHEST WALL AND BONES: Unremarkable.     UPPER ABDOMEN: Atrophic native kidneys.         -Findings are most suggestive of bronchopneumonia, the possibility of fungal pneumonia (in this clinical setting of neutropenia) versus endobronchial infectious etiology needs to be considered.                     XR Chest 2 views  Result Date: 05/03/2023  EXAM: XR CHEST 2 VIEWS ACCESSION: [id number] UN REPORT DATE: 05/03/2023 3:43 PM     CLINICAL  INDICATION: COUGH      TECHNIQUE: PA and Lateral Chest Radiographs.     COMPARISON: Chest radiograph 06/03/2022.     FINDINGS:     Lungs hypoinflated. Bibasilar airspace disease.  No pleural effusion or pneumothorax.     Normal heart size and mediastinal contours.     Prominent hilum bilaterally, unchanged since multiple priors.             Bibasilar airspace disease, may represent atelectasis versus infection. Recommend follow-up chest radiograph in 6 to 8 weeks to ensure resolution.    ECG 12 Lead  Result Date: 05/03/2023  NORMAL SINUS RHYTHM NORMAL ECG WHEN COMPARED WITH ECG OF 03-Jun-2022 03:35, QUESTIONABLE CHANGE IN QRS AXIS NONSPECIFIC T WAVE ABNORMALITY NO LONGER EVIDENT IN INFERIOR LEADS T WAVE AMPLITUDE HAS INCREASED IN ANTEROLATERAL LEADS Confirmed by Eldred Manges [phone]) on 05/03/2023 3:32:21 PM

## 2023-05-08 ENCOUNTER — Ambulatory Visit: Admit: 2023-05-08 | Discharge: 2023-05-09 | Payer: BLUE CROSS/BLUE SHIELD

## 2023-05-08 DIAGNOSIS — Z940 Kidney transplant status: Principal | ICD-10-CM

## 2023-05-08 LAB — SLIDE REVIEW

## 2023-05-08 LAB — RENAL FUNCTION PANEL
ALBUMIN: 3.7 g/dL (ref 3.4–5.0)
ANION GAP: 10 mmol/L (ref 5–14)
BLOOD UREA NITROGEN: 23 mg/dL (ref 9–23)
BUN / CREAT RATIO: 14
CALCIUM: 9.7 mg/dL (ref 8.7–10.4)
CHLORIDE: 101 mmol/L (ref 98–107)
CO2: 24 mmol/L (ref 20.0–31.0)
CREATININE: 1.59 mg/dL — ABNORMAL HIGH (ref 0.73–1.18)
EGFR CKD-EPI (2021) MALE: 56 mL/min/{1.73_m2} — ABNORMAL LOW (ref >=60)
GLUCOSE RANDOM: 93 mg/dL (ref 70–179)
PHOSPHORUS: 2.5 mg/dL (ref 2.4–5.1)
POTASSIUM: 3.8 mmol/L (ref 3.4–4.8)
SODIUM: 135 mmol/L (ref 135–145)

## 2023-05-08 LAB — CBC W/ AUTO DIFF
BASOPHILS ABSOLUTE COUNT: 0 10*9/L (ref 0.0–0.1)
BASOPHILS RELATIVE PERCENT: 0.7 %
EOSINOPHILS ABSOLUTE COUNT: 0.1 10*9/L (ref 0.0–0.5)
EOSINOPHILS RELATIVE PERCENT: 3.2 %
HEMATOCRIT: 34.5 % — ABNORMAL LOW (ref 39.0–48.0)
HEMOGLOBIN: 12.5 g/dL — ABNORMAL LOW (ref 12.9–16.5)
LYMPHOCYTES ABSOLUTE COUNT: 0.5 10*9/L — ABNORMAL LOW (ref 1.1–3.6)
LYMPHOCYTES RELATIVE PERCENT: 14.1 %
MEAN CORPUSCULAR HEMOGLOBIN CONC: 36.2 g/dL — ABNORMAL HIGH (ref 32.0–36.0)
MEAN CORPUSCULAR HEMOGLOBIN: 32.1 pg (ref 25.9–32.4)
MEAN CORPUSCULAR VOLUME: 88.5 fL (ref 77.6–95.7)
MEAN PLATELET VOLUME: 6.9 fL (ref 6.8–10.7)
MONOCYTES ABSOLUTE COUNT: 0.7 10*9/L (ref 0.3–0.8)
MONOCYTES RELATIVE PERCENT: 20.3 %
NEUTROPHILS ABSOLUTE COUNT: 2.1 10*9/L (ref 1.8–7.8)
NEUTROPHILS RELATIVE PERCENT: 61.7 %
PLATELET COUNT: 320 10*9/L (ref 150–450)
RED BLOOD CELL COUNT: 3.9 10*12/L — ABNORMAL LOW (ref 4.26–5.60)
RED CELL DISTRIBUTION WIDTH: 13.4 % (ref 12.2–15.2)
WBC ADJUSTED: 3.4 10*9/L — ABNORMAL LOW (ref 3.6–11.2)

## 2023-05-08 LAB — HISTO/BLASTO ANTIGEN, URINE
HISTOPLASMA/BLASTOMYCES AG RESULT: NOT DETECTED
HISTOPLASMA/BLASTOMYCES AG VALUE: NOT DETECTED ng/mL

## 2023-05-08 MED ADMIN — belatacept (NULOJIX) 437.5 mg in sodium chloride (NS) 0.9 % 100 mL IVPB: 5 mg/kg | INTRAVENOUS | @ 15:00:00 | Stop: 2023-05-08

## 2023-05-08 NOTE — Unmapped (Signed)
1610 Patient arrived to the Transplant Infusion Room today for belatacept 437.5mg , Condition: well; Mobility: ambulating; accompanied by self.   See Flowsheet and MAR for all details of visit.   0915 VS stable.  0940 PIV placed and secured with coban; labs collected and sent, urine no orders found.  0946 Infusion initiated. Patient educated to notify nursing staff if they experience urticaria, erythema, nausea, shortness of breath, nausea, metal taste in mouth, excessive oral or postnasal drainage and any other changes in status which could be side effects from initiating this medication.  1028 Infusion complete.   1028 VS stable.  1038 Line flushed with NS and PIV removed and secured with coban.  1040 Patient left clinic today, Condition: well; Mobility: ambulating; accompanied by self.

## 2023-05-09 NOTE — Unmapped (Signed)
Results forwarded to ID team - rec for patient to continue abx as prescribed on discharge.  Patient has appt with ID next week.

## 2023-05-15 ENCOUNTER — Ambulatory Visit
Admit: 2023-05-15 | Discharge: 2023-05-16 | Payer: BLUE CROSS/BLUE SHIELD | Attending: Geriatric Medicine | Primary: Geriatric Medicine

## 2023-05-15 DIAGNOSIS — U071 COVID: Principal | ICD-10-CM

## 2023-05-15 DIAGNOSIS — D849 Immunodeficiency, unspecified: Principal | ICD-10-CM

## 2023-05-15 DIAGNOSIS — Z940 Kidney transplant status: Principal | ICD-10-CM

## 2023-05-15 NOTE — Unmapped (Signed)
IMMUNOCOMPROMISED HOST INFECTIOUS DISEASE PROGRESS NOTE    Assessment/Plan:     [name] is a 37 y.o. 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 [Cocci IgG neg, 05/05/23]  Works in Programme researcher, broadcasting/film/video of pertinent prior infections:  none     Active infection:  #COVID infection with concern for bacterial PNA, 04/29/23, resolved  -04/26/23- Sx with myalgias, chills, fatigue  -11/25  tested COVID Positive in urgent care Rx molnupavir x 5 days  -11/29 presented for worsening cough and pleuritic chest pain  -11/230 Sputum Culture: Kp, Ec, OPF (No AST); Negative: RPP , rapid flu/covid, legionella Ag, mycoplasma OP, sputum RPP neg  CXR with Bibasilar airspace disease, may represent atelectasis versus infection  -11/30 CT chest with Diffuse significant bronchial wall thickening with distal bronchiolar plugging and bronchocentric nodular airspace disease with surrounding ill-defined groundglass opacities and scattered micronodules which are most pronounced in the peripheral right upper lobe, lateral right middle lobe, lingula, and lung bases. Mild paraseptal and centrilobular emphysema. No pleural effusion or pneumothorax.   Rx: Molnupivir x 4 days OP-> 11/29 azithro-> 11/30 CTX/Azith - 12/7      RECOMMENDATIONS    Diagnosis  CT Chest in 6-8 weeks for follow-up of possible fungal infection. Low likelihood but given transplant status and patient centered decision making will perform surveillance.  If chest pain does not resolve in the next few weeks then would re-image sooner.     Management  Patient counseled to not exceed 43-g acetaminophen per day. Coordinator messaged about  potential need for alternative pain management plan  No need for anti-inflectives at this time    Antimicrobial prophylaxis required for transplant immunosuppression   12 minute discussion regarding risks and benefits of COVID-19 vaccines, including net benefit>risk including cardiac complications. We discussed novavax as a protein-directed vaccine rather than mRNA-directed vaccine since pt has had some concerns about the new mRNA based formulations. He is going to consider vaccination at a future time, particularly novavax.      Intensive toxicity monitoring for prescription antimicrobials   Per routine transplant evaluations    Follow up PRN          Recommendations were communicated via shared medical record.    Jacquenette Shone, MD  [facility]    Subjective     External record(s):  Infusion note from 12/4 for belatacept. No complications noted . Discharge summary noting home on ICID recommended antibiotics as below.    Independent historian(s): no independent historian required.       Interval History:     Follow up from hospital discharge. Took Amoxicillin and doxy through 12/7. No change in how he felt after stopping the antibiotics. He has some ongoing left-sided chest wall pain. He cannot lie on the left side at night. No discoloration of his skin. No blisters. It  feels like when he broke his ribs. The pain is slowly getting better. His cough is improving, minimal at this time. No fevers or chills. He takes tylenol 1 gram every 4-6 hours PRN. Asked how much tylenol is too much.    No side effects from the antibiotics. No difficulty swallowing, abdominal pain, or diarrhea. No rash. No thrush.     No other symptoms today. Medications:    Current Outpatient Medications:     ALPRAZolam (XANAX) 0.5 MG tablet, Take 1 tablet (0.5 mg total) by mouth two (2) times a day as needed for anxiety., Disp: 10 tablet, Rfl: 0    [Paused] belatacept (NULOJIX) 250 mg SolR, Belatacept  5 mg/kg every 2 weeks for 5 doses then once every 4 weeks (Patient taking differently: Infuse 5 mg/kg into a venous catheter every twenty-eight (28) days.), Disp: 14.5 mL, Rfl: 0    benzonatate (TESSALON) 200 MG capsule, Take 1 capsule (200 mg total) by mouth Three (3) times a day as needed for cough., Disp: , Rfl:     empagliflozin (JARDIANCE) 10 mg tablet, Take 1 tablet (10 mg total) by mouth daily., Disp: 30 tablet, Rfl: 11    lidocaine (ASPERCREME) 4 % patch, Place 2 patches on the skin nightly for 12 hours only then remove for 12 hours., Disp: 60 patch, Rfl: 0    losartan (COZAAR) 50 MG tablet, Take 1 tablet (50 mg total) by mouth two (2) times a day., Disp: 180 tablet, Rfl: 3    magnesium oxide-Mg AA chelate (MAGNESIUM, AMINO ACID CHELATE,) 133 mg, Take 1 tablet by mouth daily., Disp: 90 tablet, Rfl: 3    omeprazole (PRILOSEC) 20 MG capsule, Take 1 capsule (20 mg total) by mouth daily., Disp: 30 capsule, Rfl: 11    ondansetron (ZOFRAN-ODT) 4 MG disintegrating tablet, Take 1 tablet (4 mg total) by mouth every eight (8) hours as needed., Disp: 30 tablet, Rfl: 0    predniSONE (DELTASONE) 5 MG tablet, Take 1 tablet (5 mg total) by mouth daily., Disp: 90 tablet, Rfl: 3  No current facility-administered medications for this visit.    Facility-Administered Medications Ordered in Other Visits:     albuterol 2.5 mg /3 mL (0.083 %) nebulizer solution 2.5 mg, 2.5 mg, Nebulization, Once PRN, Chargualaf, Lorel Monaco, CPP    albuterol 2.5 mg /3 mL (0.083 %) nebulizer solution 2.5 mg, 2.5 mg, Nebulization, Once PRN, Chargualaf, Lorel Monaco, CPP    albuterol 2.5 mg /3 mL (0.083 %) nebulizer solution 2.5 mg, 2.5 mg, Nebulization, Once PRN, Chargualaf, Lorel Monaco, CPP    pentamidine (PENTAM) inhalation solution, 300 mg, Inhalation, Once, Chargualaf, Lorel Monaco, CPP    Objective     Vital signs:  BP 119/82  - Pulse 74  - Temp 36.8 ??C (98.2 ??F) (Tympanic)  - Ht 182.9 cm (6' 0.01)  - Wt 88.7 kg (195 lb 9.6 oz)  - BMI 26.52 kg/m??     Physical Exam:  Const [x]  vital signs above    []  NAD, non-toxic appearance []  Chronically ill-appearing, non-distressed        Eyes [x]  Lids normal bilaterally, conjunctiva anicteric and noninjected OU     [x] PERRL  [x] EOMI        ENMT [x]  Normal appearance of external nose and ears, no nasal discharge        []  MMM, no lesions on lips or gums [x]  No thrush, leukoplakia, oral lesions  [x]  Dentition good []  Edentulous []  Dental caries present  []  Hearing  normal  []  TMs with good light reflexes bilaterally         Neck [x]  Neck of normal appearance and trachea midline        []  No thyromegaly, nodules, or tenderness   []  Full neck ROM        Lymph [x]  No LAD in neck     []  No LAD in supraclavicular area     []  No LAD in axillae   []  No LAD in epitrochlear chains     []  No LAD in inguinal areas        CV [x]  RRR            [x]  No peripheral edema     []  Pedal pulses intact   []  No abnormal heart sounds appreciated   [x]  Extremities WWP         Resp [x]  Normal WOB at rest    []  No breathlessness with speaking, no coughing  [x]  CTA anteriorly    [x]  CTA posteriorly    Breath sounds are diminished on R compared with the Left; no egophany, no dullness to percussion      GI [x]  Normal inspection, NTND   []  NABS     []  No umbilical hernia on exam       []  No hepatosplenomegaly     []  Inspection of perineal and perianal areas normal        GU []  Normal external genitalia     [] No urinary catheter present in urethra   [x]  No CVA tenderness    []  No tenderness over renal allograft        MSK [x]  No clubbing or cyanosis of hands       [x]  No vertebral point tenderness  []  No focal tenderness or abnormalities on palpation of joints in RUE, LUE, RLE, or LLE        Skin [x]  No rashes, lesions, or ulcers of visualized skin     [x]  Skin warm and dry to palpation   Specifically no rash over the left chest      Neuro [x]  Face expression symmetric  []  Sensation to light touch grossly intact throughout    []  Moves extremities equally    []  No tremor noted        [x]  CNs II-XII grossly intact     []  DTRs normal and symmetric throughout []  Gait unremarkable        Psych [x]  Appropriate affect       []  Fluent speech         [x]  Attentive, good eye contact  [x]  Oriented to person, place, time          []  Judgment and insight are appropriate           Data for Medical Decision Making       I discussed mgm't w/qualified health care professional(s) involved in case: renal team coordinator .    I reviewed CBC results (stable low WBC and stable AND), chemistry results (Cr is 1.59 which is down from admission from 1.78 ), and micro result(s) (from 11/29 - 12/1 admission: CrAg neg, Histo Ag neg, Coccid IgG neg; Mycoplasma PCR neg. Ec and Kp from sputum AST was not performed. Result was available after discharge and pt was improving on amox/doxy).    I independently visualized/interpreted CT images (Reviewed CT images from 12/1 given pt has ongoing left sided pleuritic/chest wall pain. No sign of consolidation, infarct, or effusion at that  time).       Results in Past 30 Days  Result Component Current Result Ref Range Previous Result Ref Range   Absolute Eosinophils 0.1 (05/08/2023) 0.0 - 0.5 10*9/L 0.1 (05/05/2023) 0.0 - 0.5 10*9/L   Absolute Lymphocytes 0.5 (L) (05/08/2023) 1.1 - 3.6 10*9/L 0.8 (L) (05/05/2023) 1.1 - 3.6 10*9/L   Absolute Neutrophils 2.1 (05/08/2023) 1.8 - 7.8 10*9/L 1.5 (L) (05/05/2023) 1.8 - 7.8 10*9/L   Alkaline Phosphatase 160 (H) (05/05/2023) 46 - 116 U/L 171 (H) (05/04/2023) 46 - 116 U/L   ALT 44 (05/05/2023) 10 - 49 U/L 48 (05/04/2023) 10 - 49 U/L   AST 33 (05/05/2023) <=34 U/L 36 (H) (05/04/2023) <=34 U/L   BUN 23 (05/08/2023) 9 - 23 mg/dL 18 (45/09/979) 9 - 23 mg/dL   Calcium 9.7 (19/06/4780) 8.7 - 10.4 mg/dL 9.6 (95/11/2128) 8.7 - 86.5 mg/dL   Creatinine 7.84 (H) (05/08/2023) 0.73 - 1.18 mg/dL 6.96 (H) (29/10/2839) 3.24 - 1.18 mg/dL   CRP 401.0 (H) (27/25/3664) <=10.0 mg/L Not in Time Range    HGB 12.5 (L) (05/08/2023) 12.9 - 16.5 g/dL 40.3 (L) (47/09/2593) 63.8 - 16.5 g/dL   Magnesium 2.2 (75/11/4330) 1.6 - 2.6 mg/dL 2.1 (95/18/8416) 1.6 - 2.6 mg/dL   Phosphorus 2.5 (60/11/3014) 2.4 - 5.1 mg/dL Not in Time Range    Platelet 320 (05/08/2023) 150 - 450 10*9/L 260 (05/05/2023) 150 - 450 10*9/L   Potassium 3.8 (05/08/2023) 3.4 - 4.8 mmol/L 3.9 (05/05/2023) 3.4 - 4.8 mmol/L   Total Bilirubin 0.3 (05/05/2023) 0.3 - 1.2 mg/dL 0.3 (06/12/3233) 0.3 - 1.2 mg/dL   WBC 3.4 (L) (57/08/2200) 3.6 - 11.2 10*9/L 3.2 (L) (05/05/2023) 3.6 - 11.2 10*9/L       Microbiology:  I personally reviewed updated microbiological culture and susceptibility data.    Imaging:  I personally reviewed updated relevant radiological studies.    Immunization History   Administered Date(s) Administered    DTP 03/11/1989    INFLUENZA INJ MDCK PF, QUAD,(FLUCELVAX)(66MO AND UP EGG FREE) 03/03/2019    Influenza Vaccine Quad(IM)6 MO-Adult(PF) 05/18/2020    Influenza Virus Vaccine, unspecified formulation 03/29/2018    OPV 03/11/1989    PNEUMOCOCCAL POLYSACCHARIDE 23-VALENT 12/16/2017    Pneumococcal Conjugate 13-Valent 12/30/2017    TdaP 05/21/2013, 01/23/2022

## 2023-05-16 DIAGNOSIS — Z940 Kidney transplant status: Principal | ICD-10-CM

## 2023-05-16 DIAGNOSIS — D849 Immunodeficiency, unspecified: Principal | ICD-10-CM

## 2023-05-16 NOTE — Unmapped (Signed)
Spoke with patient about continued pain to the left side of his chest along with continued cough.  He describes that the pain comes and goes, especially with movement and has been affecting his sleep at night.  Per Dr Toni Arthurs, plan to repeat CT of chest to follow up with this pain and persistent cough.  Order placed and patient made aware.

## 2023-05-20 DIAGNOSIS — Z940 Kidney transplant status: Principal | ICD-10-CM

## 2023-05-20 DIAGNOSIS — D849 Immunodeficiency, unspecified: Principal | ICD-10-CM

## 2023-05-21 DIAGNOSIS — D849 Immunodeficiency, unspecified: Principal | ICD-10-CM

## 2023-05-21 DIAGNOSIS — Z940 Kidney transplant status: Principal | ICD-10-CM

## 2023-05-30 DIAGNOSIS — Z940 Kidney transplant status: Principal | ICD-10-CM

## 2023-05-30 DIAGNOSIS — D849 Immunodeficiency, unspecified: Principal | ICD-10-CM

## 2023-06-05 DIAGNOSIS — D849 Immunodeficiency, unspecified: Principal | ICD-10-CM

## 2023-06-05 DIAGNOSIS — D72819 Decreased white blood cell count, unspecified: Principal | ICD-10-CM

## 2023-06-05 DIAGNOSIS — Z940 Kidney transplant status: Principal | ICD-10-CM

## 2023-06-12 DIAGNOSIS — Z940 Kidney transplant status: Principal | ICD-10-CM

## 2023-06-12 DIAGNOSIS — D849 Immunodeficiency, unspecified: Principal | ICD-10-CM

## 2023-06-13 ENCOUNTER — Encounter: Admit: 2023-06-13 | Discharge: 2023-06-14 | Payer: BLUE CROSS/BLUE SHIELD

## 2023-06-13 DIAGNOSIS — D849 Immunodeficiency, unspecified: Principal | ICD-10-CM

## 2023-06-13 DIAGNOSIS — Z940 Kidney transplant status: Principal | ICD-10-CM

## 2023-06-13 LAB — RENAL FUNCTION PANEL
ALBUMIN: 3.8 g/dL (ref 3.4–5.0)
ANION GAP: 15 mmol/L — ABNORMAL HIGH (ref 5–14)
BLOOD UREA NITROGEN: 19 mg/dL (ref 9–23)
BUN / CREAT RATIO: 11
CALCIUM: 9.5 mg/dL (ref 8.7–10.4)
CHLORIDE: 103 mmol/L (ref 98–107)
CO2: 24 mmol/L (ref 20.0–31.0)
CREATININE: 1.71 mg/dL — ABNORMAL HIGH (ref 0.73–1.18)
EGFR CKD-EPI (2021) MALE: 52 mL/min/{1.73_m2} — ABNORMAL LOW (ref >=60)
GLUCOSE RANDOM: 112 mg/dL (ref 70–179)
PHOSPHORUS: 3 mg/dL (ref 2.4–5.1)
POTASSIUM: 3.6 mmol/L (ref 3.4–4.8)
SODIUM: 142 mmol/L (ref 135–145)

## 2023-06-13 LAB — CBC W/ AUTO DIFF
BASOPHILS ABSOLUTE COUNT: 0 10*9/L (ref 0.0–0.1)
BASOPHILS RELATIVE PERCENT: 0.8 %
EOSINOPHILS ABSOLUTE COUNT: 0.1 10*9/L (ref 0.0–0.5)
EOSINOPHILS RELATIVE PERCENT: 4.5 %
HEMATOCRIT: 34.9 % — ABNORMAL LOW (ref 39.0–48.0)
HEMOGLOBIN: 12.1 g/dL — ABNORMAL LOW (ref 12.9–16.5)
LYMPHOCYTES ABSOLUTE COUNT: 0.8 10*9/L — ABNORMAL LOW (ref 1.1–3.6)
LYMPHOCYTES RELATIVE PERCENT: 38.1 %
MEAN CORPUSCULAR HEMOGLOBIN CONC: 34.7 g/dL (ref 32.0–36.0)
MEAN CORPUSCULAR HEMOGLOBIN: 32.2 pg (ref 25.9–32.4)
MEAN CORPUSCULAR VOLUME: 92.8 fL (ref 77.6–95.7)
MEAN PLATELET VOLUME: 7.8 fL (ref 6.8–10.7)
MONOCYTES ABSOLUTE COUNT: 0.4 10*9/L (ref 0.3–0.8)
MONOCYTES RELATIVE PERCENT: 20.3 %
NEUTROPHILS ABSOLUTE COUNT: 0.7 10*9/L — ABNORMAL LOW (ref 1.8–7.8)
NEUTROPHILS RELATIVE PERCENT: 36.3 %
PLATELET COUNT: 177 10*9/L (ref 150–450)
RED BLOOD CELL COUNT: 3.76 10*12/L — ABNORMAL LOW (ref 4.26–5.60)
RED CELL DISTRIBUTION WIDTH: 14.7 % (ref 12.2–15.2)
WBC ADJUSTED: 2.1 10*9/L — ABNORMAL LOW (ref 3.6–11.2)

## 2023-06-13 LAB — SLIDE REVIEW

## 2023-06-13 MED ORDER — LOSARTAN 50 MG TABLET
ORAL_TABLET | Freq: Two times a day (BID) | ORAL | 3 refills | 90 days | Status: CP
Start: 2023-06-13 — End: 2024-06-12
  Filled 2023-06-13: qty 180, 90d supply, fill #0

## 2023-06-13 MED ADMIN — belatacept (NULOJIX) 450 mg in sodium chloride (NS) 0.9 % 100 mL IVPB: 5 mg/kg | INTRAVENOUS | @ 13:00:00 | Stop: 2023-06-13

## 2023-06-13 MED FILL — OMEPRAZOLE 20 MG CAPSULE,DELAYED RELEASE: ORAL | 30 days supply | Qty: 30 | Fill #6

## 2023-06-13 MED FILL — MG-PLUS-PROTEIN 133 MG TABLET: ORAL | 90 days supply | Qty: 90 | Fill #2

## 2023-06-13 NOTE — Unmapped (Signed)
4034 Patient arrived to the Transplant Infusion Room today for belatacept 450 mg, Condition: well; Mobility: ambulating; accompanied by self.   See Flowsheet and MAR for all details of visit.   7425 VS stable.  0815 PIV placed and secured with coban; labs collected and sent, urine no orders found.  9563 Infusion initiated. Patient educated to notify nursing staff if they experience urticaria, erythema, nausea, shortness of breath, nausea, metal taste in mouth, excessive oral or postnasal drainage and any other changes in status which could be side effects from initiating this medication.  8756 Infusion complete.   0854 VS stable.  4332 Line flushed with NS and PIV removed and secured with coban.  9518 Patient left clinic today, Condition: well; Mobility: ambulating; accompanied by self.

## 2023-06-13 NOTE — Unmapped (Signed)
Specialty Medication(s): patient is using belatacept    Ryan Moon has been dis-enrolled from the [facility] specialty pharmacy services due to a change in therapy. The patient is now taking beletacept and is not filling at the [facility].    Additional information provided to the patient: n/a    Tera Helper, [facility]  [facility] Specialty Pharmacist

## 2023-06-29 DIAGNOSIS — I10 Essential (primary) hypertension: Principal | ICD-10-CM

## 2023-06-29 MED ORDER — AMLODIPINE 5 MG TABLET
ORAL_TABLET | Freq: Two times a day (BID) | ORAL | 3 refills | 0.00 days
Start: 2023-06-29 — End: ?

## 2023-07-07 DIAGNOSIS — Z940 Kidney transplant status: Principal | ICD-10-CM

## 2023-07-07 DIAGNOSIS — D849 Immunodeficiency, unspecified: Principal | ICD-10-CM

## 2023-07-08 DIAGNOSIS — Z940 Kidney transplant status: Principal | ICD-10-CM

## 2023-07-08 DIAGNOSIS — D849 Immunodeficiency, unspecified: Principal | ICD-10-CM

## 2023-07-09 DIAGNOSIS — D849 Immunodeficiency, unspecified: Principal | ICD-10-CM

## 2023-07-09 DIAGNOSIS — Z940 Kidney transplant status: Principal | ICD-10-CM

## 2023-07-10 ENCOUNTER — Encounter: Admit: 2023-07-10 | Discharge: 2023-07-11 | Payer: BLUE CROSS/BLUE SHIELD

## 2023-07-10 DIAGNOSIS — Z940 Kidney transplant status: Principal | ICD-10-CM

## 2023-07-10 DIAGNOSIS — D849 Immunodeficiency, unspecified: Principal | ICD-10-CM

## 2023-07-11 MED ORDER — AMLODIPINE 5 MG TABLET
ORAL_TABLET | Freq: Two times a day (BID) | ORAL | 3 refills | 90.00 days
Start: 2023-07-11 — End: ?

## 2023-07-12 ENCOUNTER — Inpatient Hospital Stay: Admit: 2023-07-12 | Discharge: 2023-07-13 | Payer: BLUE CROSS/BLUE SHIELD

## 2023-07-12 ENCOUNTER — Ambulatory Visit: Admit: 2023-07-12 | Discharge: 2023-07-13 | Payer: BLUE CROSS/BLUE SHIELD

## 2023-07-12 ENCOUNTER — Ambulatory Visit: Admit: 2023-07-12 | Discharge: 2023-07-13 | Payer: BLUE CROSS/BLUE SHIELD | Attending: Nephrology | Primary: Nephrology

## 2023-07-12 DIAGNOSIS — Z940 Kidney transplant status: Principal | ICD-10-CM

## 2023-07-12 DIAGNOSIS — D849 Immunodeficiency, unspecified: Principal | ICD-10-CM

## 2023-07-12 MED ORDER — OMEPRAZOLE 20 MG CAPSULE,DELAYED RELEASE
ORAL_CAPSULE | Freq: Every day | ORAL | 11 refills | 30.00 days | Status: CP
Start: 2023-07-12 — End: 2024-07-11
  Filled 2023-10-10: qty 30, 30d supply, fill #0

## 2023-07-12 MED ORDER — PREDNISONE 5 MG TABLET
ORAL_TABLET | Freq: Every day | ORAL | 3 refills | 90.00 days | Status: CP
Start: 2023-07-12 — End: ?

## 2023-07-20 DIAGNOSIS — D849 Immunodeficiency, unspecified: Principal | ICD-10-CM

## 2023-07-20 DIAGNOSIS — Z940 Kidney transplant status: Principal | ICD-10-CM

## 2023-08-06 DIAGNOSIS — D849 Immunodeficiency, unspecified: Principal | ICD-10-CM

## 2023-08-06 DIAGNOSIS — Z940 Kidney transplant status: Principal | ICD-10-CM

## 2023-08-07 ENCOUNTER — Encounter: Admit: 2023-08-07 | Discharge: 2023-08-08 | Payer: BLUE CROSS/BLUE SHIELD

## 2023-08-07 DIAGNOSIS — D849 Immunodeficiency, unspecified: Principal | ICD-10-CM

## 2023-08-07 DIAGNOSIS — Z940 Kidney transplant status: Principal | ICD-10-CM

## 2023-08-07 MED ORDER — PREDNISONE 5 MG TABLET
ORAL_TABLET | Freq: Every day | ORAL | 0 refills | 30.00 days | Status: CP
Start: 2023-08-07 — End: 2023-09-06

## 2023-08-11 DIAGNOSIS — Z940 Kidney transplant status: Principal | ICD-10-CM

## 2023-08-11 DIAGNOSIS — I7782 ANCA-associated vasculitis (CMS-HCC): Principal | ICD-10-CM

## 2023-08-11 DIAGNOSIS — D849 Immunodeficiency, unspecified: Principal | ICD-10-CM

## 2023-09-04 ENCOUNTER — Encounter: Admit: 2023-09-04 | Discharge: 2023-09-05

## 2023-09-08 DIAGNOSIS — I7782 ANCA-associated vasculitis: Principal | ICD-10-CM

## 2023-09-08 DIAGNOSIS — D849 Immunodeficiency, unspecified: Principal | ICD-10-CM

## 2023-09-08 DIAGNOSIS — Z940 Kidney transplant status: Principal | ICD-10-CM

## 2023-09-17 DIAGNOSIS — D849 Immunodeficiency, unspecified: Principal | ICD-10-CM

## 2023-09-17 DIAGNOSIS — Z940 Kidney transplant status: Principal | ICD-10-CM

## 2023-09-17 MED ORDER — MAGNESIUM OXIDE-MAGNESIUM AMINO ACID CHELATE 133 MG TABLET
ORAL_TABLET | Freq: Every day | ORAL | 0 refills | 30.00 days | Status: CP
Start: 2023-09-17 — End: 2023-10-17
  Filled 2023-10-17: qty 90, 90d supply, fill #3

## 2023-09-17 MED ORDER — PREDNISONE 5 MG TABLET
ORAL_TABLET | Freq: Every day | ORAL | 0 refills | 30.00 days | Status: CP
Start: 2023-09-17 — End: 2023-10-17
  Filled 2023-10-17: qty 90, 90d supply, fill #0

## 2023-09-17 MED ORDER — LOSARTAN 50 MG TABLET
ORAL_TABLET | Freq: Two times a day (BID) | ORAL | 0 refills | 30.00 days | Status: CP
Start: 2023-09-17 — End: 2023-10-17
  Filled 2023-10-10: qty 180, 90d supply, fill #1

## 2023-10-02 DIAGNOSIS — D849 Immunodeficiency, unspecified: Principal | ICD-10-CM

## 2023-10-02 DIAGNOSIS — Z940 Kidney transplant status: Principal | ICD-10-CM

## 2023-10-02 MED ORDER — PREDNISONE 5 MG TABLET
ORAL_TABLET | Freq: Every day | ORAL | 0 refills | 30.00000 days
Start: 2023-10-02 — End: 2023-11-01

## 2023-10-02 MED ORDER — LOSARTAN 50 MG TABLET
ORAL_TABLET | Freq: Two times a day (BID) | ORAL | 0 refills | 30.00000 days
Start: 2023-10-02 — End: 2023-11-01

## 2023-10-06 DIAGNOSIS — D849 Immunodeficiency, unspecified: Principal | ICD-10-CM

## 2023-10-06 DIAGNOSIS — I7782 ANCA-associated vasculitis: Principal | ICD-10-CM

## 2023-10-06 DIAGNOSIS — Z940 Kidney transplant status: Principal | ICD-10-CM

## 2023-10-09 ENCOUNTER — Encounter: Admit: 2023-10-09 | Discharge: 2023-10-10 | Payer: BLUE CROSS/BLUE SHIELD | Attending: Acute Care | Primary: Acute Care

## 2023-10-09 DIAGNOSIS — Z940 Kidney transplant status: Principal | ICD-10-CM

## 2023-10-09 DIAGNOSIS — D849 Immunodeficiency, unspecified: Principal | ICD-10-CM

## 2023-10-31 DIAGNOSIS — D849 Immunodeficiency, unspecified: Principal | ICD-10-CM

## 2023-10-31 DIAGNOSIS — Z940 Kidney transplant status: Principal | ICD-10-CM

## 2023-11-03 DIAGNOSIS — I7782 ANCA-associated vasculitis: Principal | ICD-10-CM

## 2023-11-03 DIAGNOSIS — D849 Immunodeficiency, unspecified: Principal | ICD-10-CM

## 2023-11-03 DIAGNOSIS — Z940 Kidney transplant status: Principal | ICD-10-CM

## 2023-11-06 ENCOUNTER — Encounter: Admit: 2023-11-06 | Discharge: 2023-11-07 | Payer: BLUE CROSS/BLUE SHIELD

## 2023-11-08 MED FILL — OMEPRAZOLE 20 MG CAPSULE,DELAYED RELEASE: ORAL | 30 days supply | Qty: 30 | Fill #1

## 2023-11-19 DIAGNOSIS — Z940 Kidney transplant status: Principal | ICD-10-CM

## 2023-11-19 DIAGNOSIS — D849 Immunodeficiency, unspecified: Principal | ICD-10-CM

## 2023-12-08 DIAGNOSIS — D849 Immunodeficiency, unspecified: Principal | ICD-10-CM

## 2023-12-08 DIAGNOSIS — Z940 Kidney transplant status: Principal | ICD-10-CM

## 2023-12-08 MED ORDER — MAGNESIUM OXIDE-MAGNESIUM AMINO ACID CHELATE 133 MG TABLET
ORAL_TABLET | Freq: Every day | ORAL | 0 refills | 30.00000 days
Start: 2023-12-08 — End: 2024-01-07

## 2023-12-09 ENCOUNTER — Encounter: Admit: 2023-12-09 | Discharge: 2023-12-10 | Payer: BLUE CROSS/BLUE SHIELD

## 2023-12-10 MED FILL — OMEPRAZOLE 20 MG CAPSULE,DELAYED RELEASE: ORAL | 30 days supply | Qty: 30 | Fill #2

## 2023-12-13 DIAGNOSIS — Z940 Kidney transplant status: Principal | ICD-10-CM

## 2023-12-13 DIAGNOSIS — D849 Immunodeficiency, unspecified: Principal | ICD-10-CM

## 2024-01-01 DIAGNOSIS — D849 Immunodeficiency, unspecified: Principal | ICD-10-CM

## 2024-01-01 DIAGNOSIS — Z940 Kidney transplant status: Principal | ICD-10-CM

## 2024-01-01 MED ORDER — DOXYCYCLINE HYCLATE 100 MG TABLET
ORAL_TABLET | Freq: Two times a day (BID) | ORAL | 0 refills | 7.00000 days | Status: CP
Start: 2024-01-01 — End: 2024-01-08

## 2024-01-04 DIAGNOSIS — Z940 Kidney transplant status: Principal | ICD-10-CM

## 2024-01-04 DIAGNOSIS — D849 Immunodeficiency, unspecified: Principal | ICD-10-CM

## 2024-01-04 MED ORDER — DOXYCYCLINE HYCLATE 100 MG TABLET
ORAL_TABLET | Freq: Two times a day (BID) | ORAL | 0 refills | 7.00000 days
Start: 2024-01-04 — End: 2024-01-11

## 2024-01-07 DIAGNOSIS — I7782 ANCA-associated vasculitis: Principal | ICD-10-CM

## 2024-01-07 DIAGNOSIS — D849 Immunodeficiency, unspecified: Principal | ICD-10-CM

## 2024-01-07 DIAGNOSIS — Z940 Kidney transplant status: Principal | ICD-10-CM

## 2024-01-07 MED FILL — PREDNISONE 5 MG TABLET: ORAL | 90 days supply | Qty: 90 | Fill #1

## 2024-01-07 MED FILL — OMEPRAZOLE 20 MG CAPSULE,DELAYED RELEASE: ORAL | 30 days supply | Qty: 30 | Fill #3

## 2024-01-07 MED FILL — LOSARTAN 50 MG TABLET: ORAL | 90 days supply | Qty: 180 | Fill #2

## 2024-01-08 ENCOUNTER — Encounter: Admit: 2024-01-08 | Discharge: 2024-01-09 | Payer: BLUE CROSS/BLUE SHIELD

## 2024-01-11 DIAGNOSIS — D849 Immunodeficiency, unspecified: Principal | ICD-10-CM

## 2024-01-11 DIAGNOSIS — Z940 Kidney transplant status: Principal | ICD-10-CM

## 2024-01-11 MED ORDER — DOXYCYCLINE HYCLATE 100 MG TABLET
ORAL_TABLET | Freq: Two times a day (BID) | ORAL | 0 refills | 7.00000 days | Status: CP
Start: 2024-01-11 — End: 2024-01-18
  Filled 2024-01-15: qty 14, 7d supply, fill #0

## 2024-01-11 MED ORDER — MAGNESIUM OXIDE-MAGNESIUM AMINO ACID CHELATE 133 MG TABLET
ORAL_TABLET | Freq: Every day | ORAL | 0 refills | 30.00000 days | Status: CP
Start: 2024-01-11 — End: 2024-02-10

## 2024-01-13 DIAGNOSIS — Z940 Kidney transplant status: Principal | ICD-10-CM

## 2024-01-13 DIAGNOSIS — D849 Immunodeficiency, unspecified: Principal | ICD-10-CM

## 2024-01-13 MED ORDER — MG-PLUS-PROTEIN 133 MG TABLET
ORAL_TABLET | Freq: Every day | ORAL | 3 refills | 90.00000 days
Start: 2024-01-13 — End: 2025-01-12

## 2024-01-15 DIAGNOSIS — D849 Immunodeficiency, unspecified: Principal | ICD-10-CM

## 2024-01-15 DIAGNOSIS — Z940 Kidney transplant status: Principal | ICD-10-CM

## 2024-01-15 MED ORDER — MG-PLUS-PROTEIN 133 MG TABLET
ORAL_TABLET | Freq: Every day | ORAL | 3 refills | 90.00000 days | Status: CP
Start: 2024-01-15 — End: 2025-01-14
  Filled 2024-01-15: qty 30, 30d supply, fill #0

## 2024-01-27 DIAGNOSIS — D849 Immunodeficiency, unspecified: Principal | ICD-10-CM

## 2024-01-27 DIAGNOSIS — Z940 Kidney transplant status: Principal | ICD-10-CM

## 2024-01-31 ENCOUNTER — Ambulatory Visit: Admit: 2024-01-31 | Discharge: 2024-02-01 | Payer: BLUE CROSS/BLUE SHIELD | Attending: Nephrology | Primary: Nephrology

## 2024-01-31 DIAGNOSIS — D849 Immunodeficiency, unspecified: Principal | ICD-10-CM

## 2024-01-31 DIAGNOSIS — Z940 Kidney transplant status: Principal | ICD-10-CM

## 2024-01-31 DIAGNOSIS — Z79899 Other long term (current) drug therapy: Principal | ICD-10-CM

## 2024-02-04 MED FILL — OMEPRAZOLE 20 MG CAPSULE,DELAYED RELEASE: ORAL | 30 days supply | Qty: 30 | Fill #4

## 2024-02-05 ENCOUNTER — Encounter: Admit: 2024-02-05 | Discharge: 2024-02-06 | Payer: BLUE CROSS/BLUE SHIELD

## 2024-02-06 DIAGNOSIS — D849 Immunodeficiency, unspecified: Principal | ICD-10-CM

## 2024-02-06 DIAGNOSIS — Z940 Kidney transplant status: Principal | ICD-10-CM

## 2024-02-08 DIAGNOSIS — D849 Immunodeficiency, unspecified: Principal | ICD-10-CM

## 2024-02-08 DIAGNOSIS — Z940 Kidney transplant status: Principal | ICD-10-CM

## 2024-02-20 DIAGNOSIS — D849 Immunodeficiency, unspecified: Principal | ICD-10-CM

## 2024-02-20 DIAGNOSIS — Z940 Kidney transplant status: Principal | ICD-10-CM

## 2024-03-04 ENCOUNTER — Encounter: Admit: 2024-03-04 | Discharge: 2024-03-05 | Payer: BLUE CROSS/BLUE SHIELD

## 2024-03-04 MED FILL — OMEPRAZOLE 20 MG CAPSULE,DELAYED RELEASE: ORAL | 30 days supply | Qty: 30 | Fill #5

## 2024-03-04 MED FILL — MG-PLUS-PROTEIN 133 MG TABLET: ORAL | 90 days supply | Qty: 90 | Fill #0

## 2024-03-08 DIAGNOSIS — D849 Immunodeficiency, unspecified: Principal | ICD-10-CM

## 2024-03-08 DIAGNOSIS — I7782 ANCA-associated vasculitis    (CMS-HCC): Principal | ICD-10-CM

## 2024-03-08 DIAGNOSIS — Z940 Kidney transplant status: Principal | ICD-10-CM

## 2024-03-09 DIAGNOSIS — D849 Immunodeficiency, unspecified: Principal | ICD-10-CM

## 2024-03-09 DIAGNOSIS — Z940 Kidney transplant status: Principal | ICD-10-CM

## 2024-03-09 MED ORDER — DOXYCYCLINE HYCLATE 100 MG TABLET
ORAL_TABLET | Freq: Two times a day (BID) | ORAL | 0 refills | 7.00000 days | Status: CP
Start: 2024-03-09 — End: 2024-03-16

## 2024-03-17 ENCOUNTER — Encounter
Admit: 2024-03-17 | Discharge: 2024-03-17 | Payer: BLUE CROSS/BLUE SHIELD | Attending: Student in an Organized Health Care Education/Training Program | Primary: Student in an Organized Health Care Education/Training Program

## 2024-03-17 DIAGNOSIS — R21 Rash and other nonspecific skin eruption: Principal | ICD-10-CM

## 2024-03-17 DIAGNOSIS — D849 Immunodeficiency, unspecified: Principal | ICD-10-CM

## 2024-03-17 DIAGNOSIS — Z940 Kidney transplant status: Principal | ICD-10-CM

## 2024-03-17 DIAGNOSIS — D492 Neoplasm of unspecified behavior of bone, soft tissue, and skin: Principal | ICD-10-CM

## 2024-03-17 MED ORDER — DOXYCYCLINE HYCLATE 100 MG TABLET
ORAL_TABLET | Freq: Two times a day (BID) | ORAL | 0 refills | 30.00000 days | Status: CP
Start: 2024-03-17 — End: 2024-04-16

## 2024-03-18 DIAGNOSIS — D849 Immunodeficiency, unspecified: Principal | ICD-10-CM

## 2024-03-18 DIAGNOSIS — Z940 Kidney transplant status: Principal | ICD-10-CM

## 2024-03-23 MED ORDER — MUPIROCIN 2 % TOPICAL OINTMENT
TOPICAL | 2 refills | 0.00000 days | Status: CP
Start: 2024-03-23 — End: ?

## 2024-04-01 DIAGNOSIS — R21 Rash and other nonspecific skin eruption: Principal | ICD-10-CM

## 2024-04-01 DIAGNOSIS — D849 Immunodeficiency, unspecified: Principal | ICD-10-CM

## 2024-04-01 DIAGNOSIS — Z940 Kidney transplant status: Principal | ICD-10-CM

## 2024-04-01 MED ORDER — DOXYCYCLINE HYCLATE 100 MG TABLET
ORAL_TABLET | Freq: Two times a day (BID) | ORAL | 0 refills | 30.00000 days | Status: CP
Start: 2024-04-01 — End: 2024-05-01

## 2024-04-02 MED FILL — OMEPRAZOLE 20 MG CAPSULE,DELAYED RELEASE: ORAL | 30 days supply | Qty: 30 | Fill #6

## 2024-04-02 MED FILL — PREDNISONE 5 MG TABLET: ORAL | 90 days supply | Qty: 90 | Fill #2

## 2024-04-05 DIAGNOSIS — D849 Immunodeficiency, unspecified: Principal | ICD-10-CM

## 2024-04-05 DIAGNOSIS — Z940 Kidney transplant status: Principal | ICD-10-CM

## 2024-04-05 DIAGNOSIS — I7782 ANCA-associated vasculitis    (CMS-HCC): Principal | ICD-10-CM

## 2024-04-07 MED FILL — LOSARTAN 50 MG TABLET: ORAL | 90 days supply | Qty: 180 | Fill #3

## 2024-04-08 ENCOUNTER — Encounter: Admit: 2024-04-08 | Discharge: 2024-04-09 | Payer: BLUE CROSS/BLUE SHIELD

## 2024-04-21 DIAGNOSIS — L0292 Furuncle, unspecified: Principal | ICD-10-CM

## 2024-04-21 DIAGNOSIS — D849 Immunodeficiency, unspecified: Principal | ICD-10-CM

## 2024-04-21 DIAGNOSIS — R21 Rash and other nonspecific skin eruption: Principal | ICD-10-CM

## 2024-04-21 DIAGNOSIS — Z940 Kidney transplant status: Principal | ICD-10-CM

## 2024-04-21 MED ORDER — DOXYCYCLINE HYCLATE 100 MG TABLET
ORAL_TABLET | Freq: Two times a day (BID) | ORAL | 2 refills | 30.00000 days | Status: CP
Start: 2024-04-21 — End: ?

## 2024-04-27 DIAGNOSIS — Z940 Kidney transplant status: Principal | ICD-10-CM

## 2024-04-27 DIAGNOSIS — D849 Immunodeficiency, unspecified: Principal | ICD-10-CM

## 2024-05-04 MED FILL — OMEPRAZOLE 20 MG CAPSULE,DELAYED RELEASE: ORAL | 30 days supply | Qty: 30 | Fill #7

## 2024-05-06 ENCOUNTER — Encounter: Admit: 2024-05-06 | Discharge: 2024-05-07 | Payer: BLUE CROSS/BLUE SHIELD

## 2024-05-06 DIAGNOSIS — Z940 Kidney transplant status: Principal | ICD-10-CM

## 2024-05-06 DIAGNOSIS — D849 Immunodeficiency, unspecified: Principal | ICD-10-CM

## 2024-05-12 DIAGNOSIS — Z940 Kidney transplant status: Principal | ICD-10-CM

## 2024-05-12 DIAGNOSIS — D849 Immunodeficiency, unspecified: Principal | ICD-10-CM

## 2024-05-19 DIAGNOSIS — D849 Immunodeficiency, unspecified: Principal | ICD-10-CM

## 2024-05-19 DIAGNOSIS — Z940 Kidney transplant status: Principal | ICD-10-CM

## 2024-05-21 DIAGNOSIS — D849 Immunodeficiency, unspecified: Principal | ICD-10-CM

## 2024-05-21 DIAGNOSIS — Z940 Kidney transplant status: Principal | ICD-10-CM

## 2024-05-22 MED ORDER — LOSARTAN 50 MG TABLET
ORAL_TABLET | Freq: Two times a day (BID) | ORAL | 3 refills | 90.00000 days | Status: CP
Start: 2024-05-22 — End: 2025-05-22

## 2024-05-25 MED FILL — OMEPRAZOLE 20 MG CAPSULE,DELAYED RELEASE: ORAL | 30 days supply | Qty: 30 | Fill #8

## 2024-05-25 MED FILL — MG-PLUS-PROTEIN 133 MG TABLET: ORAL | 90 days supply | Qty: 90 | Fill #1

## 2024-06-01 DIAGNOSIS — D849 Immunodeficiency, unspecified: Principal | ICD-10-CM

## 2024-06-01 DIAGNOSIS — Z940 Kidney transplant status: Principal | ICD-10-CM

## 2024-06-01 MED ORDER — LOSARTAN 50 MG TABLET
ORAL_TABLET | Freq: Two times a day (BID) | ORAL | 0 refills | 18.00000 days | Status: CP
Start: 2024-06-01 — End: 2024-06-19
  Filled 2024-06-17: qty 180, 90d supply, fill #0

## 2024-06-04 DIAGNOSIS — D849 Immunodeficiency, unspecified: Principal | ICD-10-CM

## 2024-06-04 DIAGNOSIS — Z940 Kidney transplant status: Principal | ICD-10-CM

## 2024-06-10 ENCOUNTER — Encounter: Admit: 2024-06-10 | Discharge: 2024-06-11 | Payer: BLUE CROSS/BLUE SHIELD

## 2024-06-10 DIAGNOSIS — Z940 Kidney transplant status: Principal | ICD-10-CM

## 2024-06-10 DIAGNOSIS — D849 Immunodeficiency, unspecified: Principal | ICD-10-CM

## 2024-06-15 DIAGNOSIS — R21 Rash and other nonspecific skin eruption: Principal | ICD-10-CM

## 2024-06-15 MED ORDER — DOXYCYCLINE HYCLATE 100 MG TABLET
ORAL_TABLET | Freq: Two times a day (BID) | ORAL | 2 refills | 30.00000 days
Start: 2024-06-15 — End: ?

## 2024-06-16 DIAGNOSIS — Z940 Kidney transplant status: Principal | ICD-10-CM

## 2024-06-16 DIAGNOSIS — L0292 Furuncle, unspecified: Principal | ICD-10-CM

## 2024-06-16 DIAGNOSIS — R21 Rash and other nonspecific skin eruption: Principal | ICD-10-CM

## 2024-06-16 DIAGNOSIS — D849 Immunodeficiency, unspecified: Principal | ICD-10-CM

## 2024-06-16 MED ORDER — DOXYCYCLINE HYCLATE 100 MG TABLET
ORAL_TABLET | Freq: Two times a day (BID) | ORAL | 2 refills | 30.00000 days | Status: CP
Start: 2024-06-16 — End: 2024-06-16
  Filled 2024-06-17: qty 60, 30d supply, fill #0

## 2024-06-17 DIAGNOSIS — Z940 Kidney transplant status: Principal | ICD-10-CM

## 2024-06-17 DIAGNOSIS — D849 Immunodeficiency, unspecified: Principal | ICD-10-CM

## 2024-06-17 MED FILL — PREDNISONE 5 MG TABLET: ORAL | 90 days supply | Qty: 90 | Fill #3

## 2024-06-17 MED FILL — OMEPRAZOLE 20 MG CAPSULE,DELAYED RELEASE: ORAL | 30 days supply | Qty: 30 | Fill #9

## 2024-06-19 DIAGNOSIS — Z940 Kidney transplant status: Principal | ICD-10-CM

## 2024-06-19 DIAGNOSIS — D849 Immunodeficiency, unspecified: Principal | ICD-10-CM

## 2024-06-19 MED ORDER — LOSARTAN 50 MG TABLET
ORAL_TABLET | Freq: Two times a day (BID) | ORAL | 0 refills | 18.00000 days | Status: CP
Start: 2024-06-19 — End: 2024-07-07

## 2024-07-08 DIAGNOSIS — D849 Immunodeficiency, unspecified: Secondary | ICD-10-CM

## 2024-07-08 DIAGNOSIS — Z940 Kidney transplant status: Principal | ICD-10-CM
